# Patient Record
Sex: Female | Born: 1950 | Race: White | Hispanic: No | Smoking: Current some day smoker
Health system: Southern US, Community
[De-identification: ages and names within clinical notes are randomized; demographics above are authoritative.]

## PROBLEM LIST (undated history)

## (undated) DIAGNOSIS — M1990 Unspecified osteoarthritis, unspecified site: Secondary | ICD-10-CM

## (undated) DIAGNOSIS — I739 Peripheral vascular disease, unspecified: Secondary | ICD-10-CM

## (undated) DIAGNOSIS — J189 Pneumonia, unspecified organism: Secondary | ICD-10-CM

## (undated) DIAGNOSIS — Z87442 Personal history of urinary calculi: Secondary | ICD-10-CM

## (undated) DIAGNOSIS — G43909 Migraine, unspecified, not intractable, without status migrainosus: Secondary | ICD-10-CM

## (undated) DIAGNOSIS — I2510 Atherosclerotic heart disease of native coronary artery without angina pectoris: Secondary | ICD-10-CM

## (undated) DIAGNOSIS — M545 Low back pain, unspecified: Secondary | ICD-10-CM

## (undated) DIAGNOSIS — F329 Major depressive disorder, single episode, unspecified: Secondary | ICD-10-CM

## (undated) DIAGNOSIS — G4730 Sleep apnea, unspecified: Secondary | ICD-10-CM

## (undated) DIAGNOSIS — M47817 Spondylosis without myelopathy or radiculopathy, lumbosacral region: Secondary | ICD-10-CM

## (undated) DIAGNOSIS — F32A Depression, unspecified: Secondary | ICD-10-CM

## (undated) DIAGNOSIS — M47812 Spondylosis without myelopathy or radiculopathy, cervical region: Secondary | ICD-10-CM

## (undated) DIAGNOSIS — J45990 Exercise induced bronchospasm: Secondary | ICD-10-CM

## (undated) DIAGNOSIS — J449 Chronic obstructive pulmonary disease, unspecified: Secondary | ICD-10-CM

## (undated) DIAGNOSIS — Z8719 Personal history of other diseases of the digestive system: Secondary | ICD-10-CM

## (undated) DIAGNOSIS — I671 Cerebral aneurysm, nonruptured: Secondary | ICD-10-CM

## (undated) DIAGNOSIS — G459 Transient cerebral ischemic attack, unspecified: Secondary | ICD-10-CM

## (undated) DIAGNOSIS — E785 Hyperlipidemia, unspecified: Secondary | ICD-10-CM

## (undated) DIAGNOSIS — I679 Cerebrovascular disease, unspecified: Secondary | ICD-10-CM

## (undated) DIAGNOSIS — I10 Essential (primary) hypertension: Secondary | ICD-10-CM

## (undated) DIAGNOSIS — I77810 Thoracic aortic ectasia: Secondary | ICD-10-CM

## (undated) DIAGNOSIS — K219 Gastro-esophageal reflux disease without esophagitis: Secondary | ICD-10-CM

## (undated) DIAGNOSIS — Z87820 Personal history of traumatic brain injury: Secondary | ICD-10-CM

## (undated) DIAGNOSIS — D649 Anemia, unspecified: Secondary | ICD-10-CM

## (undated) DIAGNOSIS — R51 Headache: Principal | ICD-10-CM

## (undated) DIAGNOSIS — G8929 Other chronic pain: Secondary | ICD-10-CM

## (undated) HISTORY — DX: Exercise induced bronchospasm: J45.990

## (undated) HISTORY — DX: Cerebral aneurysm, nonruptured: I67.1

## (undated) HISTORY — DX: Thoracic aortic ectasia: I77.810

## (undated) HISTORY — DX: Migraine, unspecified, not intractable, without status migrainosus: G43.909

## (undated) HISTORY — DX: Headache: R51

## (undated) HISTORY — DX: Cerebrovascular disease, unspecified: I67.9

## (undated) HISTORY — DX: Atherosclerotic heart disease of native coronary artery without angina pectoris: I25.10

## (undated) HISTORY — DX: Essential (primary) hypertension: I10

## (undated) HISTORY — PX: FRACTURE SURGERY: SHX138

## (undated) HISTORY — PX: FOOT FRACTURE SURGERY: SHX645

## (undated) HISTORY — DX: Spondylosis without myelopathy or radiculopathy, lumbosacral region: M47.817

## (undated) HISTORY — PX: ACROMIO-CLAVICULAR JOINT REPAIR: SHX5183

## (undated) HISTORY — DX: Spondylosis without myelopathy or radiculopathy, cervical region: M47.812

## (undated) HISTORY — PX: JOINT REPLACEMENT: SHX530

## (undated) HISTORY — PX: CARDIAC CATHETERIZATION: SHX172

## (undated) HISTORY — PX: BACK SURGERY: SHX140

## (undated) HISTORY — DX: Hyperlipidemia, unspecified: E78.5

---

## 1999-07-04 ENCOUNTER — Other Ambulatory Visit: Admission: RE | Admit: 1999-07-04 | Discharge: 1999-07-04 | Payer: Self-pay | Admitting: Obstetrics and Gynecology

## 1999-08-12 ENCOUNTER — Other Ambulatory Visit: Admission: RE | Admit: 1999-08-12 | Discharge: 1999-08-12 | Payer: Self-pay | Admitting: Obstetrics and Gynecology

## 1999-08-12 ENCOUNTER — Encounter (INDEPENDENT_AMBULATORY_CARE_PROVIDER_SITE_OTHER): Payer: Self-pay | Admitting: Specialist

## 2000-03-15 ENCOUNTER — Ambulatory Visit (HOSPITAL_COMMUNITY): Admission: RE | Admit: 2000-03-15 | Discharge: 2000-03-15 | Payer: Self-pay

## 2000-11-18 ENCOUNTER — Emergency Department (HOSPITAL_COMMUNITY): Admission: EM | Admit: 2000-11-18 | Discharge: 2000-11-18 | Payer: Self-pay | Admitting: Emergency Medicine

## 2001-01-08 ENCOUNTER — Encounter: Payer: Self-pay | Admitting: Emergency Medicine

## 2001-01-08 ENCOUNTER — Inpatient Hospital Stay (HOSPITAL_COMMUNITY): Admission: EM | Admit: 2001-01-08 | Discharge: 2001-01-15 | Payer: Self-pay

## 2001-01-13 ENCOUNTER — Encounter: Payer: Self-pay | Admitting: General Surgery

## 2001-01-14 ENCOUNTER — Encounter: Payer: Self-pay | Admitting: Internal Medicine

## 2001-01-18 ENCOUNTER — Ambulatory Visit (HOSPITAL_COMMUNITY): Admission: RE | Admit: 2001-01-18 | Discharge: 2001-01-18 | Payer: Self-pay

## 2001-02-14 ENCOUNTER — Other Ambulatory Visit: Admission: RE | Admit: 2001-02-14 | Discharge: 2001-02-14 | Payer: Self-pay | Admitting: Obstetrics and Gynecology

## 2001-04-18 ENCOUNTER — Emergency Department (HOSPITAL_COMMUNITY): Admission: EM | Admit: 2001-04-18 | Discharge: 2001-04-18 | Payer: Self-pay | Admitting: Emergency Medicine

## 2001-04-18 ENCOUNTER — Encounter: Payer: Self-pay | Admitting: Emergency Medicine

## 2001-05-07 ENCOUNTER — Other Ambulatory Visit: Admission: RE | Admit: 2001-05-07 | Discharge: 2001-05-07 | Payer: Self-pay | Admitting: Obstetrics and Gynecology

## 2002-03-09 ENCOUNTER — Emergency Department (HOSPITAL_COMMUNITY): Admission: EM | Admit: 2002-03-09 | Discharge: 2002-03-09 | Payer: Self-pay | Admitting: Emergency Medicine

## 2002-03-09 ENCOUNTER — Encounter: Payer: Self-pay | Admitting: Emergency Medicine

## 2002-10-28 ENCOUNTER — Ambulatory Visit: Admission: RE | Admit: 2002-10-28 | Discharge: 2002-10-28 | Payer: Self-pay | Admitting: Internal Medicine

## 2002-10-28 ENCOUNTER — Encounter: Payer: Self-pay | Admitting: Internal Medicine

## 2003-01-22 ENCOUNTER — Other Ambulatory Visit: Admission: RE | Admit: 2003-01-22 | Discharge: 2003-01-22 | Payer: Self-pay | Admitting: Internal Medicine

## 2004-06-23 ENCOUNTER — Ambulatory Visit (HOSPITAL_COMMUNITY): Admission: RE | Admit: 2004-06-23 | Discharge: 2004-06-23 | Payer: Self-pay | Admitting: Internal Medicine

## 2004-09-02 ENCOUNTER — Emergency Department (HOSPITAL_COMMUNITY): Admission: EM | Admit: 2004-09-02 | Discharge: 2004-09-02 | Payer: Self-pay | Admitting: Emergency Medicine

## 2005-02-01 ENCOUNTER — Ambulatory Visit: Payer: Self-pay | Admitting: Physical Medicine & Rehabilitation

## 2005-02-01 ENCOUNTER — Encounter
Admission: RE | Admit: 2005-02-01 | Discharge: 2005-05-02 | Payer: Self-pay | Admitting: Physical Medicine & Rehabilitation

## 2005-03-14 ENCOUNTER — Encounter
Admission: RE | Admit: 2005-03-14 | Discharge: 2005-03-14 | Payer: Self-pay | Admitting: Physical Medicine & Rehabilitation

## 2005-05-02 ENCOUNTER — Ambulatory Visit: Payer: Self-pay | Admitting: Physical Medicine & Rehabilitation

## 2005-05-02 ENCOUNTER — Encounter
Admission: RE | Admit: 2005-05-02 | Discharge: 2005-07-31 | Payer: Self-pay | Admitting: Physical Medicine & Rehabilitation

## 2005-08-03 ENCOUNTER — Encounter
Admission: RE | Admit: 2005-08-03 | Discharge: 2005-11-01 | Payer: Self-pay | Admitting: Physical Medicine & Rehabilitation

## 2005-08-03 ENCOUNTER — Ambulatory Visit: Payer: Self-pay | Admitting: Physical Medicine & Rehabilitation

## 2005-11-02 ENCOUNTER — Encounter
Admission: RE | Admit: 2005-11-02 | Discharge: 2006-01-31 | Payer: Self-pay | Admitting: Physical Medicine & Rehabilitation

## 2005-11-02 ENCOUNTER — Ambulatory Visit: Payer: Self-pay | Admitting: Physical Medicine & Rehabilitation

## 2006-02-06 ENCOUNTER — Other Ambulatory Visit: Admission: RE | Admit: 2006-02-06 | Discharge: 2006-02-06 | Payer: Self-pay | Admitting: Internal Medicine

## 2007-09-25 ENCOUNTER — Ambulatory Visit (HOSPITAL_COMMUNITY): Admission: RE | Admit: 2007-09-25 | Discharge: 2007-09-25 | Payer: Self-pay | Admitting: Internal Medicine

## 2007-10-05 ENCOUNTER — Emergency Department (HOSPITAL_COMMUNITY): Admission: EM | Admit: 2007-10-05 | Discharge: 2007-10-05 | Payer: Self-pay | Admitting: Emergency Medicine

## 2008-11-05 ENCOUNTER — Ambulatory Visit (HOSPITAL_COMMUNITY): Admission: RE | Admit: 2008-11-05 | Discharge: 2008-11-05 | Payer: Self-pay | Admitting: Internal Medicine

## 2008-12-25 DIAGNOSIS — I639 Cerebral infarction, unspecified: Secondary | ICD-10-CM

## 2008-12-25 HISTORY — DX: Cerebral infarction, unspecified: I63.9

## 2010-01-10 ENCOUNTER — Encounter: Admission: RE | Admit: 2010-01-10 | Discharge: 2010-01-10 | Payer: Self-pay | Admitting: Gastroenterology

## 2010-02-02 ENCOUNTER — Ambulatory Visit (HOSPITAL_COMMUNITY): Admission: RE | Admit: 2010-02-02 | Discharge: 2010-02-02 | Payer: Self-pay | Admitting: Internal Medicine

## 2010-11-23 ENCOUNTER — Ambulatory Visit (HOSPITAL_COMMUNITY)
Admission: RE | Admit: 2010-11-23 | Discharge: 2010-11-23 | Payer: Self-pay | Source: Home / Self Care | Admitting: Internal Medicine

## 2011-05-12 NOTE — Assessment & Plan Note (Signed)
MEDICAL RECORD NUMBER:  [id number]   Brenda Carney is here regarding her low back and neck pain. She had been doing  fairly well until she was squashed by a horse this past week. She is having  some new right rib pain at the chest level. Low back has been fairly stable.  She does have some tightness some days and tries to do some stretching but  not particularly consistent. She has started on a new diet which is higher  in protein and this seems to be helping her energy and mood. She takes an  oxycodone 5 mg q.6 h p.r.n. She uses her TENS unit effectively and also is  on Cymbalta 30 mg a day. The patient rates her pain at a 5/10. She calls it  intermittent, stabbing and sharp. It interferes with her general activity,  relations with others and enjoyment of life on a moderate level. Sleep is  fair.   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   REVIEW OF SYMPTOMS:  The patient reports some weakness, dizziness and  occasional nausea. She denies any GU, cardiorespiratory or constitutional  symptoms today.   PHYSICAL EXAMINATION:  VITAL SIGNS:  Blood pressure is 126/84, pulse is 108,  respiratory rate 18. She is sating 97% on room air.  GENERAL:  The patient is pleasant in no acute distress.  HEART:  Regular rate and rhythm.  LUNGS:  Clear.  NEUROLOGIC:  She is alert and oriented x3. Affect is bright and appropriate.  Gait is stable. Pelvis is symmetrical. Coordination is good. She has some  pain with palpation over the lower lumbar paraspinal's but this is minimal  today. There is pain over the right ribs at the fourth and fifth rib level  in the axillary line. No popping or crepitus was noted. Motor function was  5/5. Sensory exam is stable. Low back range of motion was relatively intact  and minimally provocative from a pain standpoint.   ASSESSMENT:  1.  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 The patient has a profound disk bulge at L4-5 and      tight facets at L5-S1. She has been fairly stable with this.  2.  Greater trochanter bursitis.  3.  Leg length discrepancy on the left.  4.  Lumbar thoracic and thoracic cervical scoliosis.  5.  Bilateral rotator cuff syndrome.   PLAN:  1.  I filled the oxycodone today, #60, 5 mg q.6 h p.r.n.  2.  I gave the patient a prescription for Lidoderm to use on the ribcage and      low back during the day from 8 a.m. to 10 p.m. approximately.  3.  Continue TENS unit.  4.  Discussed core muscle and low back strengthening exercises. I gave her a      low back handout today.  5.  Continue with shoe insert on the left.  6.  Will see the patient back in three months time.  7.  Generally she is stable and understands that she will have to live with      a certain degree of pain as long as she is in this line of work.       ZTS/MedQ  D:  08/04/2005 14:02:19  T:  08/04/2005 17:05:14  Job #:  [id number]   cc:   Lovenia Kim, D.O.  [address]  [place]  [place]  [address]  Fax: [phone]
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
## 2011-05-12 NOTE — Assessment & Plan Note (Signed)
MEDICAL RECORD NUMBER:  [id number].   [name] is here regarding her low back and neck pain. She has been doing  quite well of late. She tried a new acupressure massage table through one of  the practices here in town. It also involved infrared light treatment. The  patient found that this has been very helpful. She is really using no  oxycodone currently for pain. She continues to wear her left shoe lift. She  keeps up with her TENS unit as well as stretching. 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 The patient  rates her pain as a 4 to 5/10. Describes it as sharp, stabbing. It seems to  be relegated mostly to the neck and low back areas. Pain increases with  walking and general activities. It improves somewhat with rest. She is  having problems sleeping still. She has had an endocrine workup and  apparently she is having problems with her cortisol levels and diurnal  variation.   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   REVIEW OF SYSTEMS:  Negative for any neurological, psychiatric, GU, GI, or  cardiorespiratory complaints today.   PHYSICAL EXAMINATION:  VITAL SIGNS:  Blood pressure is 136/92, pulse 97,  respiratory rate 16. She is saturating 97% on room air.  GENERAL:  The patient is pleasant in no acute distress. She appears to have  lost some weight.  HEART:  Regular rate and rhythm.  LUNGS:  Clear.  ABDOMEN:  Soft, nontender.  NEUROLOGICAL:  She is alert and oriented x3. Affect is bright and  appropriate. Gait is stable. Sensation is normal. Reflexes are 2+.  Coordination is intact. No significant changes were noted in her physical  exam today.   ASSESSMENT:  1.  Chronic cervical and lumbar spondylosis related to spondylolisthesis and      prior polytrauma.  2.  History of greater trochanter bursitis.  3.  Leg length discrepancy on the left.  4.  Lumbar thoracic and  thoracic cervical scoliosis.  5.  Bilateral rotator cuff syndrome.   PLAN:  1.  I am in complete agreement with her acupressure treatment as long as she      willing to return for visits and treatments. Apparently, she is      receiving these free as part of a promotion. The infrared lights should      be of some value to her muscle and pain syndrome as well. I will      continue with the TENS unit and stretching as well. She needs to be      realistic about her work demands and start delegating some of her work      out as appropriate.  2.  Continue with her left shoe insert.  3.  I refilled her Valium although I would like to see her titrate this off.  4.  Agree with cortisol replacement therapy. This certainly will affect her      sleep/wake cycle.  5.  I will see her back on an as needed basis for now. No oxycodone was      given today.      Brenda Carney, M.D.  Electronically Signed     ZTS/MedQ  D:  11/03/2005 12:08:47  T:  11/03/2005 15:48:55  Job #:  [id number]   cc:   Brenda Carney, D.O.  Fax: [phone]

## 2011-05-12 NOTE — Discharge Summary (Signed)
[place]  Patient:    Brenda Carney, Brenda Carney                      MRN: [id number] Adm. Date:  [id number] Attending:  Trauma, Md Dictator:   Eugenia Pancoast, P.A. CC:         Velora Heckler, M.D., admitting physician                           Discharge Summary  DATE OF BIRTH:  1951-06-03.  FINAL DIAGNOSES: 1. Fall from horse. 2. Blunt trauma. 3. Fracture right ribs posterior eight, nine and 10. 4. Small intrahepatic hematoma. 5. Posterior right flank contusion. 6. Hypokalemia, resolved. 7. Right pleural effusion.  PROCEDURES:  Ultrasound guided needle thoracentesis per radiology.  HISTORY OF PRESENT ILLNESS:  This is a 60 year old female who was thrown by a horse and then the horse stepped on her back on the right side.  Because of this, the patient was brought to the [facility] Emergency Room. At the time of arrival, she was complaining of pain in the lower back area.  She was also complaining of pain in the right posterior flank and chest area.  She was alert and oriented.  She had no head injury.  She had some mild facial lacerations on her forehead and some over the dorsum of her hand.  Otherwise she was satisfactory.  Because of the nature of the injury, she underwent CT scan of the abdomen and pelvis which revealed a small intrahepatic hematoma. Chest x-ray revealed a fracture of right ribs eight, nine and 10 posteriorly. Because of this, she was admitted.  HOSPITAL COURSE:  During admission, initially she did satisfactorily.  She did have a lot of pain and pain control was an issue initially but this subsequently was brought under control.  She did develop, over her stay, a small right pleural effusion.  Subsequently, on January 14, 2001, she underwent ultrasound guided needle thoracentesis.  Fluid was removed at this time.  The patient did breath easier.  Post procedure the patient did have complaint of some shortness of breath.   Repeat chest x-rays were all negative. She subsequently had chest x-ray PA upright done on January 15, 2001, in the morning and this was without any signs of any pneumothorax.  The infusion was resolved at this time.  On January 15, 2001, the patient was doing quite well. She was having no complaints of shortness of breath.  She was tolerating a diet satisfactorily.  Her last O2 saturation was 90% on room air. This should be expected due to the patients decreased inspirations because of the pain. She was doing well at this time.  She was alert and oriented.  Her pain was controlled and her chest was clear to auscultation.  She was subsequently prepared for discharge.  She was given Percocet 5/325 one or two p.o. q.4-6h. p.r.n. for pain.  She was subsequently discharged to home on January 15, 2001. She would follow up with trauma clinic on Friday, January 18, 2001, for a repeat chest x-ray. The patient is subsequently discharged to home in satisfactory and stable condition. DD:  01/15/01 TD:  01/15/01 Job: [id number]

## 2011-05-12 NOTE — Assessment & Plan Note (Signed)
MEDICAL RECORD NUMBER:  Is #[id number].   Brenda Carney is back regarding her low back and neck pain.  She has been doing  fairly well since we last talked.  She has been at a 4 to 6 out of 10 on a  pain scale.  The pain is sharp, intermittent, stabbing.  She has had a hard  time unlocking her back.  She does some manual medicine/traction type  techniques that help her back loosen up.  They seem not to have been working  lately.  She uses her TENS unit an hour a day and finds this is helpful.  She had some pain along the right anterior hip that seems to have improved  on its own.  The pain is worse throughout the day usually with activity and  at bed.  It is worse with walking, bending, standing.  Improves with rest,  pacing, medication, TENS unit.  She found that the generic oxycodone caused  her nausea whereas the brand name OxyContin immediate release did seem to  help her pain with less side effects.  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  She climbs stairs, drives, and works 80 hours a week.   REVIEW OF SYSTEMS:  Positive for nausea, dizziness, occasional shortness of  breath.   SOCIAL HISTORY:  Unchanged.   PHYSICAL EXAMINATION:  VITAL SIGNS:  Blood pressure is 119/71, pulse is 100,  respiratory rate is 16.  She is sating 96% on room air.  GENERAL:  The patient is pleasant, no acute distress.  HEART:  Regular rate and rhythm.  CHEST:  Clear.  ABDOMEN:  Soft, nontender.  NEUROLOGIC:  The patient is alert and oriented x 3.  Affect is bright  appropriate.  Gait is fairly normal.  Coordination is fair.  Reflexes are  2+.  Sensation is intact.  MUSCULOSKELETAL:  Reveals 5/5 strength in all four extremities.  She has  pain in the lower lumbar facets and at the PSIS areas bilaterally.  Facet  maneuvers were positive.  Flexion seemed to relieve some pain.  Luisa Hart test  was equivocal to negative.  Cervical spine range of motion was unchanged.  She had less signs of  impingement today as opposed to the last visit.   ASSESSMENT:  1.  Chronic cervical lumbar spine pain due to polytrauma and      spondylolisthesis in the lumbar spine with profound disk bulge at L4-L5.      She has some tight facets at L5-S1.  It appears particularly on the left      side.  2.  Greater trochanter bursitis.  3.  Leg length discrepancy on the left.  4.  Lumbar/thoracic and thoracic/cervical scoliosis.  5.  Bilateral rotator cuff syndrome.   PLAN:  1.  I have refilled oxycodone with brand name OxyContin IR 5 mg q.6h. p.r.n.      #60.  2.  Continue TENS unit.  3.  Discussed flexion exercises for the back.  4.  Continue with shoe insert.  She may consider a custom insert at some      point but seems satisfied with the current off the shelf insert we      provided.  5.  I will see her back in about three months' time in followup.  Generally      she is stable and is cognizant of her stretching program and her general      limits with activity.  ZTS/MedQ  D:  05/02/2005 15:04:45  T:  05/02/2005 15:26:11  Job #:  [id number]   cc:   Lovenia Kim, D.O.  [address]  [place]  [place] [address]  Fax: [phone]

## 2011-05-12 NOTE — Assessment & Plan Note (Signed)
MEDICAL RECORD NUMBER:  [id number]   Brenda Carney is here in followup of her multiple pain complaints involving her  hip, low back and feet. We injected her bilateral greater trochanters at the  last visit and the patient had excellent results with these. We also tried a  1/4 inch shoe lift on the left side and the patient did well with this  nicely also. She uses oxycodone rarely for her breakthrough pain. She has  not spoken further with Dr. Danielle Dess regarding surgical opinion for her back.  She rates her pain at average of 4/10. She has gotten back to a lot of her  old activities involving her horses and pets.  The low back remains a  problem to a certain extent as well as the neck region. She exercises but is  not on a regular stretching or aerobic program.  The pain is worse with  walking, bending and lifting and improves with rest and her medications.  She has had some problems intermittently in the left shoulder blade.   SOCIAL HISTORY:  The patient lives with her husband and is married. Other  pertinent issues are mentioned above.   REVIEW OF SYMPTOMS:  The patient reports a heart murmur, occasional nausea  with vomiting but this is rare. She reports reflux and dysphagia at times  due to hiatal hernia. She has gained weight over the past several months.  She does report sweating and headaches.   PHYSICAL EXAMINATION:  VITAL SIGNS:  Blood pressure is 133/85, pulse is 110,  respiratory rate 16, she is sating 96% on room air.  NEUROLOGIC:  The patient walks with a fairly normal gait, affect is bright  and appropriate, appearance is well kept.  She was not wearing her shoe lift  on the left today and had some imbalance in the pelvis once again of  approximately 1/4 inch.  The PSIS areas were moderately tender to palpation.  Facets were mildly uncomfortable. She is able to bend forward without any  pain and rotate to either side with minimal discomfort, clear trochanters  were minimally  tender today. She did have some pain along the left side of  the rhomboids. There was a mild dextroscoliosis of the thoraco into cervical  spine. She had some difficulty with left neck bending and rotation.  Shoulders revealed impingement signs but to lesser extent than her last  visit. Phalen's test was positive bilaterally. Neurologically cranial nerve  exam was intact. Reflexes were 2+, sensory exam was 2/2.  Strength was 5/5  throughout.   ASSESSMENT:  1.  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  2.  Greater trochanter bursitis improved after injections.  3.  Leg length discrepancy on the left side.  4.  Lumbar thoracic and thoracic cervical scoliosis.  5.  Bilateral rotator cuff syndrome.   PLAN:  1.  I encouraged home exercise program and stretching routine.  2.  Will set the patient up with a TENS unit to some areas of muscle      tightness in the thoraco and low cervical spine.  I will have her seen      by physical therapy for evaluation in set up with a TENS unit. She may      also receive some exercises to start with from therapy to work on her  low back and cervical maintenance.  3.  Ice and stretching to the hip areas.  4.  Consider custom insert to the left shoe.  5.  In the meantime, she may buy a similar size insert to place in her shoes      to accommodate for her leg length discrepancy.  6.  Use oxycodone sparingly for breakthrough pain.  7.  Will see the patient back in about two months time.      ZTS/MedQ  D:  02/27/2005 21:39:06  T:  02/28/2005 08:04:49  Job #:  [id number]   cc:   Lovenia Kim, D.O.  [address], [address]  [place]  [place] [address]  Fax: [phone]

## 2011-07-07 ENCOUNTER — Other Ambulatory Visit (HOSPITAL_COMMUNITY)
Admission: RE | Admit: 2011-07-07 | Discharge: 2011-07-07 | Disposition: A | Discharge status: Home / Self Care | Payer: BC Managed Care – PPO | Source: Ambulatory Visit | Attending: Internal Medicine | Admitting: Internal Medicine

## 2011-07-07 ENCOUNTER — Ambulatory Visit (HOSPITAL_COMMUNITY)
Admission: RE | Admit: 2011-07-07 | Discharge: 2011-07-07 | Disposition: A | Discharge status: Home / Self Care | Payer: BC Managed Care – PPO | Source: Ambulatory Visit | Attending: Internal Medicine | Admitting: Internal Medicine

## 2011-07-07 ENCOUNTER — Other Ambulatory Visit: Payer: Self-pay | Admitting: Internal Medicine

## 2011-07-07 ENCOUNTER — Other Ambulatory Visit (HOSPITAL_COMMUNITY): Payer: Self-pay | Admitting: Internal Medicine

## 2011-07-07 DIAGNOSIS — R059 Cough, unspecified: Secondary | ICD-10-CM

## 2011-07-07 DIAGNOSIS — R05 Cough: Secondary | ICD-10-CM

## 2011-07-07 DIAGNOSIS — I10 Essential (primary) hypertension: Secondary | ICD-10-CM | POA: Insufficient documentation

## 2011-07-07 DIAGNOSIS — Z87891 Personal history of nicotine dependence: Secondary | ICD-10-CM

## 2011-07-07 DIAGNOSIS — J984 Other disorders of lung: Secondary | ICD-10-CM | POA: Insufficient documentation

## 2011-07-07 DIAGNOSIS — Z01419 Encounter for gynecological examination (general) (routine) without abnormal findings: Secondary | ICD-10-CM | POA: Insufficient documentation

## 2011-07-20 ENCOUNTER — Ambulatory Visit
Admission: RE | Admit: 2011-07-20 | Discharge: 2011-07-20 | Disposition: A | Discharge status: Home / Self Care | Payer: BC Managed Care – PPO | Source: Ambulatory Visit | Attending: Internal Medicine | Admitting: Internal Medicine

## 2011-07-20 ENCOUNTER — Other Ambulatory Visit: Payer: Self-pay | Admitting: Internal Medicine

## 2011-07-20 DIAGNOSIS — IMO0002 Reserved for concepts with insufficient information to code with codable children: Secondary | ICD-10-CM

## 2011-07-20 DIAGNOSIS — R52 Pain, unspecified: Secondary | ICD-10-CM

## 2011-07-20 DIAGNOSIS — I639 Cerebral infarction, unspecified: Secondary | ICD-10-CM

## 2011-07-20 DIAGNOSIS — R29810 Facial weakness: Secondary | ICD-10-CM

## 2011-07-21 ENCOUNTER — Other Ambulatory Visit: Payer: Self-pay | Admitting: Internal Medicine

## 2011-07-21 DIAGNOSIS — I6529 Occlusion and stenosis of unspecified carotid artery: Secondary | ICD-10-CM

## 2011-07-25 ENCOUNTER — Ambulatory Visit
Admission: RE | Admit: 2011-07-25 | Discharge: 2011-07-25 | Disposition: A | Discharge status: Home / Self Care | Payer: BC Managed Care – PPO | Source: Ambulatory Visit | Attending: Internal Medicine | Admitting: Internal Medicine

## 2011-07-25 DIAGNOSIS — I6529 Occlusion and stenosis of unspecified carotid artery: Secondary | ICD-10-CM

## 2011-09-25 DIAGNOSIS — G459 Transient cerebral ischemic attack, unspecified: Secondary | ICD-10-CM

## 2011-09-25 HISTORY — DX: Transient cerebral ischemic attack, unspecified: G45.9

## 2011-10-01 ENCOUNTER — Emergency Department (HOSPITAL_COMMUNITY)
Admission: EM | Admit: 2011-10-01 | Discharge: 2011-10-01 | Disposition: A | Discharge status: Other Acute Inpatient Hospital | Payer: BC Managed Care – PPO | Source: Home / Self Care | Attending: Emergency Medicine | Admitting: Emergency Medicine

## 2011-10-01 ENCOUNTER — Observation Stay (HOSPITAL_COMMUNITY)
Admission: AD | Admit: 2011-10-01 | Discharge: 2011-10-02 | DRG: 014 | Disposition: A | Discharge status: Home / Self Care | Payer: BC Managed Care – PPO | Source: Other Acute Inpatient Hospital | Attending: Internal Medicine | Admitting: Internal Medicine

## 2011-10-01 ENCOUNTER — Emergency Department (HOSPITAL_COMMUNITY): Payer: BC Managed Care – PPO

## 2011-10-01 DIAGNOSIS — Z8673 Personal history of transient ischemic attack (TIA), and cerebral infarction without residual deficits: Secondary | ICD-10-CM | POA: Insufficient documentation

## 2011-10-01 DIAGNOSIS — R0602 Shortness of breath: Secondary | ICD-10-CM | POA: Insufficient documentation

## 2011-10-01 DIAGNOSIS — R079 Chest pain, unspecified: Secondary | ICD-10-CM | POA: Insufficient documentation

## 2011-10-01 DIAGNOSIS — E876 Hypokalemia: Secondary | ICD-10-CM | POA: Insufficient documentation

## 2011-10-01 DIAGNOSIS — IMO0002 Reserved for concepts with insufficient information to code with codable children: Secondary | ICD-10-CM | POA: Insufficient documentation

## 2011-10-01 DIAGNOSIS — R29810 Facial weakness: Principal | ICD-10-CM | POA: Insufficient documentation

## 2011-10-01 DIAGNOSIS — F17200 Nicotine dependence, unspecified, uncomplicated: Secondary | ICD-10-CM | POA: Insufficient documentation

## 2011-10-01 DIAGNOSIS — I10 Essential (primary) hypertension: Secondary | ICD-10-CM | POA: Insufficient documentation

## 2011-10-01 DIAGNOSIS — R51 Headache: Secondary | ICD-10-CM | POA: Insufficient documentation

## 2011-10-01 DIAGNOSIS — E785 Hyperlipidemia, unspecified: Secondary | ICD-10-CM | POA: Insufficient documentation

## 2011-10-01 DIAGNOSIS — E039 Hypothyroidism, unspecified: Secondary | ICD-10-CM | POA: Insufficient documentation

## 2011-10-01 DIAGNOSIS — R1310 Dysphagia, unspecified: Secondary | ICD-10-CM | POA: Insufficient documentation

## 2011-10-01 LAB — POCT I-STAT, CHEM 8
BUN: 19 mg/dL (ref 6–23)
Calcium, Ion: 1.17 mmol/L (ref 1.12–1.32)
Chloride: 106 mEq/L (ref 96–112)
Creatinine, Ser: 0.8 mg/dL (ref 0.50–1.10)
Glucose, Bld: 106 mg/dL — ABNORMAL HIGH (ref 70–99)
HCT: 38 % (ref 36.0–46.0)
Hemoglobin: 12.9 g/dL (ref 12.0–15.0)
Potassium: 3.1 mEq/L — ABNORMAL LOW (ref 3.5–5.1)
Sodium: 138 mEq/L (ref 135–145)
TCO2: 22 mmol/L (ref 0–100)

## 2011-10-01 LAB — DIFFERENTIAL
Basophils Absolute: 0 10*3/uL (ref 0.0–0.1)
Basophils Relative: 0 % (ref 0–1)
Eosinophils Absolute: 0.3 10*3/uL (ref 0.0–0.7)
Eosinophils Relative: 3 % (ref 0–5)
Lymphocytes Relative: 31 % (ref 12–46)
Lymphs Abs: 2.8 10*3/uL (ref 0.7–4.0)
Monocytes Absolute: 0.6 10*3/uL (ref 0.1–1.0)
Monocytes Relative: 7 % (ref 3–12)
Neutro Abs: 5.2 10*3/uL (ref 1.7–7.7)
Neutrophils Relative %: 58 % (ref 43–77)

## 2011-10-01 LAB — COMPREHENSIVE METABOLIC PANEL
ALT: 18 U/L (ref 0–35)
AST: 35 U/L (ref 0–37)
Albumin: 3.4 g/dL — ABNORMAL LOW (ref 3.5–5.2)
Alkaline Phosphatase: 96 U/L (ref 39–117)
BUN: 18 mg/dL (ref 6–23)
CO2: 22 mEq/L (ref 19–32)
Calcium: 9.5 mg/dL (ref 8.4–10.5)
Chloride: 99 mEq/L (ref 96–112)
Creatinine, Ser: 0.69 mg/dL (ref 0.50–1.10)
GFR calc Af Amer: >90 mL/min (ref >90)
GFR calc non Af Amer: >90 mL/min (ref >90)
Glucose, Bld: 104 mg/dL — ABNORMAL HIGH (ref 70–99)
Potassium: 3.1 mEq/L — ABNORMAL LOW (ref 3.5–5.1)
Sodium: 136 mEq/L (ref 135–145)
Total Bilirubin: 0.2 mg/dL — ABNORMAL LOW (ref 0.3–1.2)
Total Protein: 6.8 g/dL (ref 6.0–8.3)

## 2011-10-01 LAB — CK TOTAL AND CKMB (NOT AT ARMC)
CK, MB: 2.5 ng/mL (ref 0.3–4.0)
Relative Index: INVALID (ref 0.0–2.5)
Total CK: 76 U/L (ref 7–177)

## 2011-10-01 LAB — PROTIME-INR
INR: 0.96 (ref 0.00–1.49)
Prothrombin Time: 13 seconds (ref 11.6–15.2)

## 2011-10-01 LAB — CBC
HCT: 38.2 % (ref 36.0–46.0)
Hemoglobin: 13.7 g/dL (ref 12.0–15.0)
MCH: 35.4 pg — ABNORMAL HIGH (ref 26.0–34.0)
MCHC: 35.9 g/dL (ref 30.0–36.0)
MCV: 98.7 fL (ref 78.0–100.0)
Platelets: 343 10*3/uL (ref 150–400)
RBC: 3.87 MIL/uL (ref 3.87–5.11)
RDW: 12.9 % (ref 11.5–15.5)
WBC: 8.9 10*3/uL (ref 4.0–10.5)

## 2011-10-01 LAB — APTT: aPTT: 27 seconds (ref 24–37)

## 2011-10-01 LAB — TROPONIN I: Troponin I: <0.3 ng/mL (ref <0.30)

## 2011-10-02 ENCOUNTER — Inpatient Hospital Stay (HOSPITAL_COMMUNITY): Payer: BC Managed Care – PPO

## 2011-10-02 LAB — LIPID PANEL
Cholesterol: 209 mg/dL — ABNORMAL HIGH (ref 0–200)
HDL: 43 mg/dL (ref >39)
LDL Cholesterol: 105 mg/dL — ABNORMAL HIGH (ref 0–99)
Total CHOL/HDL Ratio: 4.9 RATIO
Triglycerides: 305 mg/dL — ABNORMAL HIGH (ref <150)
VLDL: 61 mg/dL — ABNORMAL HIGH (ref 0–40)

## 2011-10-02 LAB — CBC
HCT: 34.7 % — ABNORMAL LOW (ref 36.0–46.0)
Hemoglobin: 12.3 g/dL (ref 12.0–15.0)
MCH: 34.4 pg — ABNORMAL HIGH (ref 26.0–34.0)
MCHC: 35.4 g/dL (ref 30.0–36.0)
MCV: 96.9 fL (ref 78.0–100.0)
Platelets: 300 10*3/uL (ref 150–400)
RBC: 3.58 MIL/uL — ABNORMAL LOW (ref 3.87–5.11)
RDW: 12.8 % (ref 11.5–15.5)
WBC: 7.4 10*3/uL (ref 4.0–10.5)

## 2011-10-02 LAB — URINALYSIS, ROUTINE W REFLEX MICROSCOPIC
Bilirubin Urine: NEGATIVE
Glucose, UA: NEGATIVE mg/dL
Hgb urine dipstick: NEGATIVE
Ketones, ur: NEGATIVE mg/dL
Nitrite: NEGATIVE
Protein, ur: NEGATIVE mg/dL
Specific Gravity, Urine: 1.019 (ref 1.005–1.030)
Urobilinogen, UA: 0.2 mg/dL (ref 0.0–1.0)
pH: 6 (ref 5.0–8.0)

## 2011-10-02 LAB — BASIC METABOLIC PANEL
BUN: 14 mg/dL (ref 6–23)
CO2: 25 mEq/L (ref 19–32)
Calcium: 8.8 mg/dL (ref 8.4–10.5)
Chloride: 106 mEq/L (ref 96–112)
Creatinine, Ser: 0.49 mg/dL — ABNORMAL LOW (ref 0.50–1.10)
GFR calc Af Amer: >90 mL/min (ref >90)
GFR calc non Af Amer: >90 mL/min (ref >90)
Glucose, Bld: 99 mg/dL (ref 70–99)
Potassium: 3.6 mEq/L (ref 3.5–5.1)
Sodium: 140 mEq/L (ref 135–145)

## 2011-10-02 LAB — URINE MICROSCOPIC-ADD ON

## 2011-10-02 LAB — HEMOGLOBIN A1C
Hgb A1c MFr Bld: 5.3 % (ref <5.7)
Mean Plasma Glucose: 105 mg/dL (ref <117)

## 2011-10-02 LAB — GLUCOSE, CAPILLARY: Glucose-Capillary: 110 mg/dL — ABNORMAL HIGH (ref 70–99)

## 2011-10-02 NOTE — Consult Note (Signed)
NAMEMARILI, Carney NO.:  [id number]  MEDICAL RECORD NO.:  [id number]  LOCATION:  3030                         FACILITY:  [facility]  PHYSICIAN:  Kipp Laurence, MD DATE OF BIRTH:  1951/09/14  DATE OF CONSULTATION:  10/01/2011 DATE OF DISCHARGE:                                CONSULTATION   CHIEF COMPLAINT:  Left face feels funny.  HISTORY OF PRESENT ILLNESS:  [name] is a 60 year old white woman with history of hypertension, hyperlipidemia, and a recent CVA in July 2012 during which she experienced left facial numbness and fullness who presents to the Emergency Department of [facility] with complaints of a recurrent left facial fullness beginning at 6:30 this evening.  The patient reports that she was at her baseline all day today. At around 6 o'clock she got into the shower and at around 6:30 p.m. when she got out of the shower, she noticed that left side of her face felt full and swollen.  In the mirror, she was also concerned that there was some drooping of the left side of her face.  She reports she had mild numbness over the same distribution and numbness of her tongue.  She denies any other symptoms at that time.  The patient presented with her husband to the [facility] ED where a code stroke was called. CT scan of the head was obtained and appeared unchanged from her recent scan in July of this year.  During evaluation by the ED physician, she was noted to have some improvement of her left facial droop.  The patient also reports that she has experienced recent symptoms of possible upper respiratory infection and some dysuria concerning for UTI.  She reports that she has recurrent problems with pneumonias and with urinary tract infections; otherwise she states she has recently been in good health and did not notice any residual symptoms from her stroke in July.  PAST MEDICAL HISTORY: 1. 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 2. Hypertension. 3. Hyperlipidemia. 4. Chronic back pain. 5. Recurrent pneumonia. 6. Tobacco abuse. 7. Left AC joint surgery in the 1990s. 8. Right toe surgery about 5 years ago. 9. History of trauma from being kicked by a horse in the     thoracoabdominal region 7 to 8 years ago.  No head trauma.  MEDICATIONS:  Please see med reconciliation list, the patient is on: 1. Plavix 75 mg daily. 2. Aspirin 325 mg daily at home and has been compliant with these.  ALLERGIES:  PENICILLIN.  FAMILY HISTORY:  Mom has history of MI, hyperlipidemia, and hypertension.  Dad  has history of coronary artery disease, peripheral arterial disease, and died of a stroke at the age of 92.  SOCIAL HISTORY:  The patient works as a Physiological scientist.  She does report tobacco use and smokes approximately 3 to 4 cigarettes per day.  She reports alcohol use and drinks about one mixed drink per day.  She denies illicit drug use.  REVIEW OF SYSTEMS:  A complete 10 system review  of systems was obtained and is negative except as noted in the HPI.  PHYSICAL EXAMINATION:  VITAL SIGNS:  Blood pressure 140/82, heart rate 96, respiratory rate 16, and O2 saturation 85%. PULMONARY:  Upper airway noise; bibasilar rhonchi. CARDIOVASCULAR:  Regular rate and rhythm.  No murmurs, gallops, or rubs. No carotid bruits auscultated. NEURO: Mental status:  The patient is alert and oriented x3.  Speech is clear. Language is fluent.  Memory is intact.  She is able to follow multi-step commands. Cranial nerves: II-  Visual fields are intact to confrontation. Funduscopic exam is unremarkable.  III, IV, VI-  Extraocular movements are intact.  Pupils are equally round and react to light accommodation. No nystagmus.  V-  Facial sensation is intact bilaterally.  No weakness in masticatory muscles.  VII-  Left lower facial weakness is slight.   No upper facial weakness noted.  VIII-  Auditory acuity is grossly intact bilaterally.  IX, X-  Uvula is midline.  Palate elevates symmetrically. XI-  5/5 strength in bilateral sternocleidomastoids and trapezius.  XII- Tongue is midline and does not deviate. Motor:  5/5 strength throughout. Sensory:  Decreased to light touch in the left arm otherwise intact throughout.  The patient is not sure if this is a baseline finding or not. Coordination:  Intact finger-nose-finger, heel-to-shin, and rapid alternating movements. Reflexes:  2+ deep tendon reflexes throughout, absent Hoffmann's and Babinski's bilaterally. Gait:  Routine gait is intact.  NIH stroke scale score of 2 (1 for slight left lower facial droop, and 1 for left arm sensory change).  TESTING RESULTS:  BMP, CBC, and coags are unremarkable with the exception of potassium of 3.1.  EKG reveals sinus tachycardia.  Chest x- ray is normal.  IMAGING: 1. CT of the head on October 01, 2011: left deep white matter     periventricular hypodensity appears unchanged from CT scan from     July 20, 2011.  No acute abnormalities noted. 2. Carotid duplex from July 25, 2011: bilateral carotid bifurcationplaques with less than 50% stenosis; normal vertebral arteries.  ASSESSMENT:  A 60 year old white woman with a recent cerebrovascular accident in July 2012 and with hypertension and hyperlipidemia representing with recurrent symptoms of left facial weakness and numbness with some improvement during her visit to the ED and with recent symptoms of possible respiratory infection.  I suspect that her symptoms are secondary to transient ischemic attack versus a small cerebrovascular accident versus re-demonstration of old stroke symptoms secondary to respiratory or urinary tract infection.  T-PA was not given secondary to her low NIH stroke scale score and mild improvement of symptoms.  Of note, the patient's swallowing was evaluated at bedside  by nursing, and she did fail this evaluation secondary to a report of unusual sensation at the back of her throat.  PLAN: 1. Recommend inpatient workup with MRI of the brain with and without     contrast and MRA of the head and neck to further evaluate. 2. Consider repeat TTE versus obtaining outpatient records.  The     patient states she had an outpatient TTE done in July. 3. Recommend risk stratification labs with hemoglobin A1c, TSH, and     lipids. 4. Would continue her home aspirin and Plavix dosages.  She has had     both of these today. 5. Recommend workup of her reported infectious symptoms as per     Medicine. 6. Would consider repeating bedside swallow eval and if it is still  abnormal recommend Speech consult tomorrow for FEES.  Thank you very much for this consultation.          ______________________________ Kipp Laurence, MD     ES/MEDQ  D:  10/01/2011  T:  10/02/2011  Job:  [id number]  Electronically Signed by Kipp Laurence MD on 10/02/2011 05:01:41 AM

## 2011-10-02 NOTE — H&P (Signed)
Brenda Carney, Brenda Carney NO.:  [id number]  MEDICAL RECORD NO.:  [id number]  LOCATION:  3030                         FACILITY:  [facility]  PHYSICIAN:  Della Goo, M.D. DATE OF BIRTH:  [name] 14, 1951  DATE OF ADMISSION:  10/01/2011 DATE OF DISCHARGE:                             HISTORY & PHYSICAL   DATE OF ADMISSION:  October 01, 2011.  PRIMARY CARE PHYSICIAN:  Elby Showers, MD,  Bennye Alm.  CARDIOLOGIST:  Richard A. Alanda Amass, M.D.  CHIEF COMPLAINT:  Left-sided facial droop and weakness.  HISTORY OF PRESENT ILLNESS:  This is a 60 year old female with a previous history of a CVA in July 2012 of the left brain who began to have similar symptoms that began at 7:30 p.m. tonight.  The left side of her face began to droop.  She says the area felt numb and tingly.  The area also felt swollen.  She does report that she was having trouble with her swallowing at that time.  The patient reports that she had a headache, but did not think this was changed from her usual types of headaches that she has chronically.  She also reports beginning to feel anxious and having some chest pain and shortness of breath.  She presented to the hospital for evaluation at 7:35 and the code stroke protocol was initiated.  A CT scan of the head was performed, results of which did not reveal any acute findings.  Evidence of her previous CVA had been seen.  The old stroke was in the area of the anterior left periventricular white matter.  However, the CT report did state this could be an evolving infarct or chronic ischemic change.  While in the emergency department, the patient's symptoms did began to improve over time.  The patient began to have less drooping of her face and this was noted by staff.  The code stroke MD was notified and evaluated the patient and wanted the patient to transfer to [facility] to the telemetry neuro unit.  The patient was referred to the  hospitalist service.  The patient denies having any weakness on her left side or her right side.  She denied having any difficulty walking.  She denied having any visual changes.  PAST MEDICAL HISTORY:  Significant for previous CVA in July 2012, this was a left brain stroke; hypothyroidism; hypertension; dyslipidemia; degenerative disk disease; and carotid artery disease of the left carotid.  PAST SURGICAL HISTORY:  History of multiple injuries from accident with horses.  She has had the left [facility] joint removal after a horse fell on her, she has also had left great toe surgery, right navicular fracture which was pinned, and left great toe fracture surgery as well and left fifth toe surgery as well from fracture.  She has also had bunionectomy.  ALLERGIES:  To PENICILLIN, which causes swelling of the neck.  MEDICATIONS:  Include Plavix, estradiol, lisinopril, Livalo, oxycodone/APAP, Pristiq, and Valium.  SOCIAL HISTORY:  The patient is married.  She is a nonsmoker and nondrinker.  No history of illicit drug usage.  FAMILY HISTORY:  Positive for CVAs in both parents, peripheral vascular disease in her father, coronary artery disease  in her mother, hypertension in both parents, cancer in her brother who had lung cancer and was a smoker, and breast cancer in her mother.  REVIEW OF SYSTEMS:  Pertinent as mentioned above.  PHYSICAL EXAMINATION FINDINGS:  GENERAL:  This is a 60 year old pleasant well-nourished, well-developed Caucasian female who is in no acute distress. VITAL SIGNS: Temperature 98.9, blood pressure 133/90, heart rate 111 now 97, and respirations 16-20, and O2 saturations 100%. HEENT:  Normocephalic and atraumatic.  Pupils are equally round and reactive to light.  Extraocular movements are intact.  Funduscopic benign.  There is no scleral icterus.  Nares are patent bilaterally. Oropharynx is clear.  There is no tongue deviation.  At this particular time, there is no  facial drooping, which is apparent and facial muscle strength is intact. NECK:  Supple, full range of motion.  No thyromegaly, adenopathy, or jugular venous distention.  No carotid bruits are heard. CARDIOVASCULAR:  Regular rate and rhythm.  Normal S1 and S2.  No murmurs, gallops, or rubs appreciated. LUNGS:  Clear to auscultation bilaterally.  No rales, rhonchi, or wheezes.  Chest wall is nontender, chest wall excursion is symmetric and breathing is unlabored.  ABDOMEN:  Positive bowel sounds, soft, nontender, and nondistended.  No hepatosplenomegaly. EXTREMITIES:  Without cyanosis, clubbing, or edema. NEUROLOGIC:  The patient is alert and oriented x3.  Her cranial nerves are intact.  Motor and sensory function is also intact.  There is no facial drooping at this time.  There is no pronator drifting at this time.  She is able to move all four of her extremities.  Strength is 5/5.  Babinski signs are equivocal.  LABORATORY STUDIES:  Protime 13.0, INR 0.96, and PTT 27.  White blood cell count 8.9, hemoglobin 13.7, hematocrit 38.2, MCV 98.7, platelets 343, neutrophils 58% and lymphocytes 31%.  Sodium 138, potassium 3.1, chloride 106, CO2 22, BUN 19, creatinine 0.80, and glucose 106.  CT scan as mentioned above.  Chest x-ray reveals no evidence of acute pulmonary disease.  Old right rib fractures are seen and resection of the distal left clavicle is seen.  EKG reveals mild sinus tachycardia at a rate of 108; otherwise, there are no acute ST-segment changes that are seen.  ASSESSMENT:  This is a 60 year old female being admitted with, 1. Left brain stroke, chronic versus subacute. 2. Hypertension. 3. Hypokalemia. 4. Dyslipidemia or hyperlipidemia. 5. History of hypothyroidism. 6. Carotid artery disease. 7. Degenerative disk disease.  PLAN:  The patient will be transferred to the [facility] telemetry neuro unit on the 3000 wing.  She will be admitted to the [facility]  Team II.  She will undergo a CVA workup per the ischemic CVA protocol.  Neurology has also been consulted and will continue to follow this case as well.  The patient's regular medications will be further verified, p.r.n. medications for blood pressure will also be ordered, and per the protocol, the patient will have neurologic checks, cardiac enzymes, and fasting lipids checked.  Her TSH level will also be checked as well.  DVT prophylaxis has been ordered and the patient is a full code at this time.     Della Goo, M.D.     HJ/MEDQ  D:  10/01/2011  T:  10/01/2011  Job:  [id number]  Electronically Signed by Della Goo M.D. on 10/02/2011 02:12:02 AM

## 2011-10-03 LAB — B. BURGDORFI ANTIBODIES: B burgdorferi Ab IgG+IgM: 0.1 {ISR}

## 2011-10-05 LAB — URINALYSIS, ROUTINE W REFLEX MICROSCOPIC
Bilirubin Urine: NEGATIVE
Glucose, UA: NEGATIVE
Ketones, ur: NEGATIVE
Nitrite: NEGATIVE
Protein, ur: NEGATIVE
Specific Gravity, Urine: 1.023
Urobilinogen, UA: 0.2
pH: 6

## 2011-10-05 LAB — URINE CULTURE
Colony Count: NO GROWTH
Culture: NO GROWTH

## 2011-10-05 LAB — URINE MICROSCOPIC-ADD ON

## 2011-11-02 ENCOUNTER — Ambulatory Visit
Admission: RE | Admit: 2011-11-02 | Discharge: 2011-11-02 | Disposition: A | Discharge status: Home / Self Care | Payer: BC Managed Care – PPO | Source: Ambulatory Visit | Attending: Internal Medicine | Admitting: Internal Medicine

## 2011-11-02 ENCOUNTER — Other Ambulatory Visit: Payer: Self-pay | Admitting: Internal Medicine

## 2011-11-02 DIAGNOSIS — R05 Cough: Secondary | ICD-10-CM

## 2011-11-02 DIAGNOSIS — R059 Cough, unspecified: Secondary | ICD-10-CM

## 2011-11-02 DIAGNOSIS — R079 Chest pain, unspecified: Secondary | ICD-10-CM

## 2013-06-11 ENCOUNTER — Other Ambulatory Visit: Payer: Self-pay | Admitting: Cardiovascular Disease

## 2013-06-12 LAB — COMPREHENSIVE METABOLIC PANEL
ALT: 18 U/L (ref 0–35)
AST: 26 U/L (ref 0–37)
Albumin: 4.2 g/dL (ref 3.5–5.2)
Alkaline Phosphatase: 66 U/L (ref 39–117)
BUN: 9 mg/dL (ref 6–23)
CO2: 27 mEq/L (ref 19–32)
Calcium: 9.2 mg/dL (ref 8.4–10.5)
Chloride: 105 mEq/L (ref 96–112)
Creat: 0.74 mg/dL (ref 0.50–1.10)
Glucose, Bld: 83 mg/dL (ref 70–99)
Potassium: 3.6 mEq/L (ref 3.5–5.3)
Sodium: 141 mEq/L (ref 135–145)
Total Bilirubin: 0.5 mg/dL (ref 0.3–1.2)
Total Protein: 6.6 g/dL (ref 6.0–8.3)

## 2013-06-12 LAB — CBC WITH DIFFERENTIAL/PLATELET
Basophils Absolute: 0 10*3/uL (ref 0.0–0.1)
Basophils Relative: 1 % (ref 0–1)
Eosinophils Absolute: 0.5 10*3/uL (ref 0.0–0.7)
Eosinophils Relative: 7 % — ABNORMAL HIGH (ref 0–5)
HCT: 44 % (ref 36.0–46.0)
Hemoglobin: 15.5 g/dL — ABNORMAL HIGH (ref 12.0–15.0)
Lymphocytes Relative: 38 % (ref 12–46)
Lymphs Abs: 3 10*3/uL (ref 0.7–4.0)
MCH: 33.4 pg (ref 26.0–34.0)
MCHC: 35.2 g/dL (ref 30.0–36.0)
MCV: 94.8 fL (ref 78.0–100.0)
Monocytes Absolute: 0.6 10*3/uL (ref 0.1–1.0)
Monocytes Relative: 7 % (ref 3–12)
Neutro Abs: 3.8 10*3/uL (ref 1.7–7.7)
Neutrophils Relative %: 47 % (ref 43–77)
Platelets: 361 10*3/uL (ref 150–400)
RBC: 4.64 MIL/uL (ref 3.87–5.11)
RDW: 14.9 % (ref 11.5–15.5)
WBC: 7.9 10*3/uL (ref 4.0–10.5)

## 2013-06-12 LAB — TSH: TSH: 0.735 u[IU]/mL (ref 0.350–4.500)

## 2013-06-12 LAB — LIPID PANEL
Cholesterol: 232 mg/dL — ABNORMAL HIGH (ref 0–200)
HDL: 63 mg/dL (ref >39)
LDL Cholesterol: 119 mg/dL — ABNORMAL HIGH (ref 0–99)
Total CHOL/HDL Ratio: 3.7 Ratio
Triglycerides: 250 mg/dL — ABNORMAL HIGH (ref <150)
VLDL: 50 mg/dL — ABNORMAL HIGH (ref 0–40)

## 2013-08-27 ENCOUNTER — Other Ambulatory Visit: Payer: Self-pay | Admitting: *Deleted

## 2013-08-27 DIAGNOSIS — R0989 Other specified symptoms and signs involving the circulatory and respiratory systems: Secondary | ICD-10-CM

## 2013-09-05 ENCOUNTER — Emergency Department (HOSPITAL_COMMUNITY): Payer: BC Managed Care – PPO

## 2013-09-05 ENCOUNTER — Encounter (HOSPITAL_COMMUNITY): Payer: Self-pay

## 2013-09-05 ENCOUNTER — Observation Stay (HOSPITAL_COMMUNITY)
Admission: EM | Admit: 2013-09-05 | Discharge: 2013-09-06 | Disposition: A | Discharge status: Home / Self Care | Payer: BC Managed Care – PPO | Attending: Internal Medicine | Admitting: Internal Medicine

## 2013-09-05 DIAGNOSIS — Z7902 Long term (current) use of antithrombotics/antiplatelets: Secondary | ICD-10-CM | POA: Insufficient documentation

## 2013-09-05 DIAGNOSIS — I10 Essential (primary) hypertension: Secondary | ICD-10-CM

## 2013-09-05 DIAGNOSIS — Z79899 Other long term (current) drug therapy: Secondary | ICD-10-CM | POA: Insufficient documentation

## 2013-09-05 DIAGNOSIS — R29810 Facial weakness: Secondary | ICD-10-CM

## 2013-09-05 DIAGNOSIS — G459 Transient cerebral ischemic attack, unspecified: Principal | ICD-10-CM

## 2013-09-05 DIAGNOSIS — R269 Unspecified abnormalities of gait and mobility: Secondary | ICD-10-CM

## 2013-09-05 DIAGNOSIS — E876 Hypokalemia: Secondary | ICD-10-CM

## 2013-09-05 DIAGNOSIS — E785 Hyperlipidemia, unspecified: Secondary | ICD-10-CM

## 2013-09-05 DIAGNOSIS — Z720 Tobacco use: Secondary | ICD-10-CM

## 2013-09-05 DIAGNOSIS — F17200 Nicotine dependence, unspecified, uncomplicated: Secondary | ICD-10-CM

## 2013-09-05 HISTORY — DX: Unspecified osteoarthritis, unspecified site: M19.90

## 2013-09-05 HISTORY — DX: Transient cerebral ischemic attack, unspecified: G45.9

## 2013-09-05 LAB — CREATININE, SERUM
Creatinine, Ser: 0.7 mg/dL (ref 0.50–1.10)
GFR calc Af Amer: >90 mL/min (ref >90)
GFR calc non Af Amer: >90 mL/min (ref >90)

## 2013-09-05 LAB — CBC
HCT: 42.7 % (ref 36.0–46.0)
HCT: 42.3 % (ref 36.0–46.0)
Hemoglobin: 15.6 g/dL — ABNORMAL HIGH (ref 12.0–15.0)
Hemoglobin: 15.2 g/dL — ABNORMAL HIGH (ref 12.0–15.0)
MCH: 35 pg — ABNORMAL HIGH (ref 26.0–34.0)
MCH: 34.6 pg — ABNORMAL HIGH (ref 26.0–34.0)
MCHC: 36.5 g/dL — ABNORMAL HIGH (ref 30.0–36.0)
MCHC: 35.9 g/dL (ref 30.0–36.0)
MCV: 95.7 fL (ref 78.0–100.0)
MCV: 96.4 fL (ref 78.0–100.0)
Platelets: 286 10*3/uL (ref 150–400)
Platelets: 269 10*3/uL (ref 150–400)
RBC: 4.46 MIL/uL (ref 3.87–5.11)
RBC: 4.39 MIL/uL (ref 3.87–5.11)
RDW: 12.9 % (ref 11.5–15.5)
RDW: 12.8 % (ref 11.5–15.5)
WBC: 8.6 10*3/uL (ref 4.0–10.5)
WBC: 10.3 10*3/uL (ref 4.0–10.5)

## 2013-09-05 LAB — COMPREHENSIVE METABOLIC PANEL
ALT: 19 U/L (ref 0–35)
AST: 29 U/L (ref 0–37)
Albumin: 3.8 g/dL (ref 3.5–5.2)
Alkaline Phosphatase: 66 U/L (ref 39–117)
BUN: 18 mg/dL (ref 6–23)
CO2: 22 mEq/L (ref 19–32)
Calcium: 9.6 mg/dL (ref 8.4–10.5)
Chloride: 99 mEq/L (ref 96–112)
Creatinine, Ser: 0.64 mg/dL (ref 0.50–1.10)
GFR calc Af Amer: >90 mL/min (ref >90)
GFR calc non Af Amer: >90 mL/min (ref >90)
Glucose, Bld: 100 mg/dL — ABNORMAL HIGH (ref 70–99)
Potassium: 3.2 mEq/L — ABNORMAL LOW (ref 3.5–5.1)
Sodium: 135 mEq/L (ref 135–145)
Total Bilirubin: 0.6 mg/dL (ref 0.3–1.2)
Total Protein: 6.9 g/dL (ref 6.0–8.3)

## 2013-09-05 LAB — POCT I-STAT, CHEM 8
BUN: 18 mg/dL (ref 6–23)
Calcium, Ion: 1.2 mmol/L (ref 1.13–1.30)
Chloride: 104 mEq/L (ref 96–112)
Creatinine, Ser: 0.9 mg/dL (ref 0.50–1.10)
Glucose, Bld: 102 mg/dL — ABNORMAL HIGH (ref 70–99)
HCT: 47 % — ABNORMAL HIGH (ref 36.0–46.0)
Hemoglobin: 16 g/dL — ABNORMAL HIGH (ref 12.0–15.0)
Potassium: 2.9 mEq/L — ABNORMAL LOW (ref 3.5–5.1)
Sodium: 140 mEq/L (ref 135–145)
TCO2: 22 mmol/L (ref 0–100)

## 2013-09-05 LAB — DIFFERENTIAL
Basophils Absolute: 0 K/uL (ref 0.0–0.1)
Basophils Relative: 0 % (ref 0–1)
Eosinophils Absolute: 0.3 K/uL (ref 0.0–0.7)
Eosinophils Relative: 4 % (ref 0–5)
Lymphocytes Relative: 25 % (ref 12–46)
Lymphs Abs: 2.2 10*3/uL (ref 0.7–4.0)
Monocytes Absolute: 0.7 10*3/uL (ref 0.1–1.0)
Monocytes Relative: 8 % (ref 3–12)
Neutro Abs: 5.4 10*3/uL (ref 1.7–7.7)
Neutrophils Relative %: 63 % (ref 43–77)

## 2013-09-05 LAB — APTT: aPTT: 24 seconds (ref 24–37)

## 2013-09-05 LAB — PROTIME-INR
INR: 0.93 (ref 0.00–1.49)
Prothrombin Time: 12.3 seconds (ref 11.6–15.2)

## 2013-09-05 LAB — TROPONIN I: Troponin I: <0.3 ng/mL (ref <0.30)

## 2013-09-05 MED ORDER — ARIPIPRAZOLE 5 MG PO TABS
2.5000 mg | ORAL_TABLET | Freq: Every day | ORAL | Status: DC
Start: 2013-09-05 — End: 2013-09-06
  Administered 2013-09-05 – 2013-09-06 (×2): 2.5 mg via ORAL
  Filled 2013-09-05 (×2): qty 1

## 2013-09-05 MED ORDER — COENZYME Q10 30 MG PO CAPS: 30.0000 mg | ORAL_CAPSULE | Freq: Every day | ORAL | Status: DC

## 2013-09-05 MED ORDER — ADULT MULTIVITAMIN W/MINERALS CH
1.0000 | ORAL_TABLET | Freq: Every day | ORAL | Status: DC
  Administered 2013-09-06: 1 via ORAL
  Filled 2013-09-05 (×2): qty 1

## 2013-09-05 MED ORDER — TETRAHYDROZOLINE HCL 0.05 % OP SOLN: 1.0000 [drp] | Freq: Two times a day (BID) | OPHTHALMIC | Status: DC | PRN

## 2013-09-05 MED ORDER — HYDROCHLOROTHIAZIDE 25 MG PO TABS
25.0000 mg | ORAL_TABLET | Freq: Every day | ORAL | Status: DC
  Administered 2013-09-06: 25 mg via ORAL
  Filled 2013-09-05: qty 1

## 2013-09-05 MED ORDER — CENTRUM PO CHEW: 1.0000 | CHEWABLE_TABLET | Freq: Every day | ORAL | Status: DC

## 2013-09-05 MED ORDER — ENOXAPARIN SODIUM 40 MG/0.4ML ~~LOC~~ SOLN
40.0000 mg | SUBCUTANEOUS | Status: DC
  Administered 2013-09-05: 40 mg via SUBCUTANEOUS
  Filled 2013-09-05 (×2): qty 0.4

## 2013-09-05 MED ORDER — FLUTICASONE FUROATE 27.5 MCG/SPRAY NA SUSP: 2.0000 | NASAL | Status: DC | PRN

## 2013-09-05 MED ORDER — METOPROLOL SUCCINATE ER 25 MG PO TB24
25.0000 mg | ORAL_TABLET | Freq: Every day | ORAL | Status: DC
  Administered 2013-09-05: 25 mg via ORAL
  Filled 2013-09-05 (×2): qty 1

## 2013-09-05 MED ORDER — LISINOPRIL 20 MG PO TABS
30.0000 mg | ORAL_TABLET | Freq: Every day | ORAL | Status: DC
  Administered 2013-09-06: 30 mg via ORAL
  Filled 2013-09-05: qty 1

## 2013-09-05 MED ORDER — SODIUM CHLORIDE 0.9 % IJ SOLN
3.0000 mL | Freq: Two times a day (BID) | INTRAMUSCULAR | Status: DC
  Administered 2013-09-06: 3 mL via INTRAVENOUS

## 2013-09-05 MED ORDER — TRAMADOL HCL 50 MG PO TABS
50.0000 mg | ORAL_TABLET | Freq: Four times a day (QID) | ORAL | Status: DC | PRN
  Administered 2013-09-05 – 2013-09-06 (×2): 50 mg via ORAL
  Filled 2013-09-05 (×2): qty 1

## 2013-09-05 MED ORDER — ASPIRIN 325 MG PO TABS
325.0000 mg | ORAL_TABLET | Freq: Every day | ORAL | Status: DC
  Administered 2013-09-05 – 2013-09-06 (×2): 325 mg via ORAL
  Filled 2013-09-05 (×2): qty 1

## 2013-09-05 MED ORDER — POTASSIUM CHLORIDE CRYS ER 20 MEQ PO TBCR
40.0000 meq | EXTENDED_RELEASE_TABLET | Freq: Once | ORAL | Status: AC
  Administered 2013-09-05: 40 meq via ORAL
  Filled 2013-09-05: qty 2

## 2013-09-05 MED ORDER — SODIUM CHLORIDE 0.9 % IJ SOLN: 3.0000 mL | INTRAMUSCULAR | Status: DC | PRN

## 2013-09-05 MED ORDER — SODIUM CHLORIDE 0.9 % IV SOLN: 250.0000 mL | INTRAVENOUS | Status: DC | PRN

## 2013-09-05 MED ORDER — LISINOPRIL-HYDROCHLOROTHIAZIDE 20-25 MG PO TABS: 1.0000 | ORAL_TABLET | Freq: Every day | ORAL | Status: DC

## 2013-09-05 MED ORDER — IPRATROPIUM-ALBUTEROL 20-100 MCG/ACT IN AERS: 2.0000 | INHALATION_SPRAY | Freq: Four times a day (QID) | RESPIRATORY_TRACT | Status: DC | PRN

## 2013-09-05 MED ORDER — KRILL OIL 300 MG PO CAPS: 300.0000 mg | ORAL_CAPSULE | Freq: Every day | ORAL | Status: DC

## 2013-09-05 MED ORDER — ATORVASTATIN CALCIUM 20 MG PO TABS
20.0000 mg | ORAL_TABLET | Freq: Every day | ORAL | Status: DC
  Administered 2013-09-06: 20 mg via ORAL
  Filled 2013-09-05 (×2): qty 1

## 2013-09-05 MED ORDER — FLUTICASONE PROPIONATE 50 MCG/ACT NA SUSP: 2.0000 | Freq: Every day | NASAL | Status: DC | PRN

## 2013-09-05 MED ORDER — LISINOPRIL 10 MG PO TABS: 10.0000 mg | ORAL_TABLET | Freq: Every day | ORAL | Status: DC

## 2013-09-05 MED ORDER — CLOPIDOGREL BISULFATE 75 MG PO TABS
75.0000 mg | ORAL_TABLET | Freq: Every evening | ORAL | Status: DC
  Administered 2013-09-05 – 2013-09-06 (×2): 75 mg via ORAL
  Filled 2013-09-05 (×2): qty 1

## 2013-09-05 MED ORDER — VENLAFAXINE HCL ER 75 MG PO CP24
75.0000 mg | ORAL_CAPSULE | Freq: Every day | ORAL | Status: DC
  Administered 2013-09-06: 75 mg via ORAL
  Filled 2013-09-05 (×3): qty 1

## 2013-09-05 NOTE — ED Provider Notes (Signed)
CSN: [id number]     Arrival date & time 09/05/13  0933 History   First MD Initiated Contact with Patient 09/05/13 0945     Chief Complaint  Patient presents with  . Facial Droop   (Consider location/radiation/quality/duration/timing/severity/associated sxs/prior Treatment) HPI Comments: Brenda Carney is a 62 y.o. female who presents for evaluation of "droopy" left face. She noticed this upon awakening this morning. She has also had some mild difficulty with her left hand for 2 days, which she noted when she was attempting to break her horse's mane. She denies headache. She was able to eat some crackers this morning. She has not noted altered taste sensation. She denies fever, chills, nausea, vomiting, trouble walking, chest pain, dysuria, or urinary frequency. She had a mild headache. This morning, but took 4 baby aspirin and headache, resolved. There are no other known modifying factors.   The history is provided by the patient.    Past Medical History  Diagnosis Date  . TIA (transient ischemic attack)   . Hypertension   . Hyperlipidemia   . Arthritis   . Asthma     exercise induced   Past Surgical History  Procedure Laterality Date  . 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 History  Substance Use Topics  . Smoking status: Current Some Day Smoker  . Smokeless tobacco: Not on file  . Alcohol Use: Yes     Comment: ocassional wine   OB History   Grav Para Term Preterm Abortions TAB SAB Ect Mult Living                 Review of Systems  All other systems reviewed and are negative.    Allergies  Penicillins  Home Medications   Current Outpatient Rx  Name  Route  Sig  Dispense  Refill  . ARIPiprazole (ABILIFY) 5 MG tablet   Oral   Take 2.5 mg by mouth daily.         Marland Kitchen aspirin EC 81 MG tablet   Oral   Take 81 mg by mouth 2 (two) times daily.         . clopidogrel (PLAVIX) 75 MG tablet    Oral   Take 75 mg by mouth every evening.         Marland Kitchen co-enzyme Q-10 30 MG capsule   Oral   Take 30 mg by mouth daily.         Marland Kitchen desvenlafaxine (PRISTIQ) 100 MG 24 hr tablet   Oral   Take 100 mg by mouth daily.         . fluticasone (VERAMYST) 27.5 MCG/SPRAY nasal spray   Each Nare   Place 2 sprays into both nostrils as needed for rhinitis or allergies.         . Ipratropium-Albuterol (COMBIVENT RESPIMAT) 20-100 MCG/ACT AERS respimat   Inhalation   Inhale 2 puffs into the lungs every 6 (six) hours as needed for wheezing.         Boris Lown Oil 300 MG CAPS   Oral   Take 300 mg by mouth daily.         Marland Kitchen lisinopril (PRINIVIL,ZESTRIL) 10 MG tablet   Oral   Take 10 mg by mouth daily.         Marland Kitchen lisinopril-hydrochlorothiazide (PRINZIDE,ZESTORETIC) 20-25 MG per tablet   Oral   Take 1 tablet by mouth daily.         Marland Kitchen  metoprolol succinate (TOPROL-XL) 25 MG 24 hr tablet   Oral   Take 25 mg by mouth every evening.         . multivitamin-iron-minerals-folic acid (CENTRUM) chewable tablet   Oral   Chew 1 tablet by mouth daily.         . Pitavastatin Calcium (LIVALO) 4 MG TABS   Oral   Take 4 mg by mouth daily.         . Tetrahydrozoline HCl (VISINE OP)   Both Eyes   Place 1 drop into both eyes daily as needed (dry eyes).          BP 162/102  Pulse 67  Temp(Src) 97.3 F (36.3 C) (Oral)  Resp 20  Ht 5\' 5"  (1.651 m)  Wt 149 lb (67.586 kg)  BMI 24.79 kg/m2  SpO2 99% Physical Exam  Nursing note and vitals reviewed. Constitutional: She is oriented to person, place, and time. She appears well-developed and well-nourished.  HENT:  Head: Normocephalic and atraumatic.  Eyes: Conjunctivae and EOM are normal. Pupils are equal, round, and reactive to light.  Neck: Normal range of motion and phonation normal. Neck supple.  Cardiovascular: Normal rate, regular rhythm and intact distal pulses.   Pulmonary/Chest: Effort normal and breath sounds normal. She  exhibits no tenderness.  Abdominal: Soft. She exhibits no distension. There is no tenderness. There is no guarding.  Musculoskeletal: Normal range of motion.  Neurological: She is alert and oriented to person, place, and time. She has normal strength. She exhibits normal muscle tone.  Very minimal left mid face droop. No weakness, or for or platysma muscles. No dysarthria, no aphasia. No ataxia or nystagmus.  Skin: Skin is warm and dry.  Psychiatric: She has a normal mood and affect. Her behavior is normal. Judgment and thought content normal.    ED Course  Procedures (including critical care time)  10:00- CT for evaluation of CVA, deferred due out of stroke window, by timing; and minimal deficits, that would be unlikely to be diagnosed on CT. She is not a TPA or direct intervention candidate.  Case discussed with neural hospitalist, who will see the patient as a consultant  Case discussed with the medical hospitalist, who will admit the patient  Patient Vitals for the past 24 hrs:  BP Temp Temp src Pulse Resp SpO2 Height Weight  09/05/13 1648 162/102 mmHg 97.3 F (36.3 C) Oral 67 20 99 % - -  09/05/13 1315 136/92 mmHg - - 72 19 97 % - -  09/05/13 1100 150/83 mmHg 98 F (36.7 C) Oral 71 20 95 % - -  09/05/13 1004 - 98.2 F (36.8 C) - - - - - -  09/05/13 0945 162/92 mmHg - - 79 14 99 % - -  09/05/13 0939 168/115 mmHg 98.2 F (36.8 C) Oral - 20 - 5\' 5"  (1.651 m) 149 lb (67.586 kg)     Date: 09/05/13  Rate: 80  Rhythm: normal sinus rhythm  QRS Axis: normal  PR and QT Intervals: normal  ST/T Wave abnormalities: normal  PR and QRS Conduction Disutrbances:none  Narrative Interpretation:   Old EKG Reviewed: changes noted- 10/01/2011, rate slower    Labs Review Labs Reviewed  CBC - Abnormal; Notable for the following:    Hemoglobin 15.6 (*)    MCH 35.0 (*)    MCHC 36.5 (*)    All other components within normal limits  COMPREHENSIVE METABOLIC PANEL - Abnormal; Notable for the  following:  Potassium 3.2 (*)    Glucose, Bld 100 (*)    All other components within normal limits  POCT I-STAT, CHEM 8 - Abnormal; Notable for the following:    Potassium 2.9 (*)    Glucose, Bld 102 (*)    Hemoglobin 16.0 (*)    HCT 47.0 (*)    All other components within normal limits  PROTIME-INR  APTT  DIFFERENTIAL  TROPONIN I  LIPID PANEL   Imaging Review Mr Brain Wo Contrast  09/05/2013   CLINICAL DATA:  Left-sided facial droop.  EXAM: MRI HEAD WITHOUT CONTRAST  TECHNIQUE: Multiplanar, multisequence MR imaging was performed. No intravenous contrast was administered.  COMPARISON:  10/02/2011  FINDINGS: Diffusion imaging does not show any acute or subacute infarction. There are mild chronic small-vessel changes of the pons. No focal cerebellar insult. The cerebral hemispheres show chronic appearing small vessel infarctions affecting the deep and subcortical white matter. No cortical or large vessel territory infarction. No mass lesion, hemorrhage, hydrocephalus or extra-axial collection. Compared to the previous study, the white matter disease is somewhat progressive.  IMPRESSION: No acute or subacute to infarction. 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   Electronically Signed   By: Paulina Fusi M.D.   On: 09/05/2013 14:45    MDM   1. TIA (transient ischemic attack)   2. Dyslipidemia   3. HTN (hypertension)   4. Tobacco abuse    Evaluation consistent with TIA, she needs further evaluation and inpatient setting, for that   Nursing Notes Reviewed/ Care Coordinated, and agree without changes. Applicable Imaging Reviewed.  Interpretation of Laboratory Data incorporated into ED treatment  Plan: Admit    Flint Melter, MD 09/05/13 1610

## 2013-09-05 NOTE — ED Notes (Signed)
Dr. Wentz back at the bedside.  

## 2013-09-05 NOTE — ED Notes (Signed)
Dr. Wentz at the bedside.  

## 2013-09-05 NOTE — ED Notes (Signed)
Notified RN of CBG 107 

## 2013-09-05 NOTE — ED Notes (Signed)
Pt back from MRI 

## 2013-09-05 NOTE — ED Notes (Signed)
Pt arrived by POV. C/o going to bed last night at around 2000. Woke up this morning at 0700 with left sided facial droop. 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

## 2013-09-05 NOTE — Consult Note (Signed)
NEURO HOSPITALIST CONSULT NOTE    Reason for Consult: left face droopiness, left hand clumsiness, imbalance.   HPI:                                                                                                                                          Brenda Carney is an 62 y.o. female with a past medical history significant for HTN, hyperlipidemia, TIA's x 5, asthma, arthritis, who was in her usual state of health until this morning when she woke up with a sensation of " swollen and droopy left face". At the same time, she noticed some impairment performing fine motor movements with the left hand and some imbalance. Stated that she had had prior TIA's with left face droopiness. Brenda Carney said that the whole episode today lasted for a couple of hours. No associated vertigo, double vision, difficulty swallowing, confusion, or visual disturbances. MRI-DWI brain revealed no acute abnormality. She takes aspirin and plavix daily.   Past Medical History  Diagnosis Date  . TIA (transient ischemic attack)   . Hypertension   . Hyperlipidemia   . Arthritis   . Asthma     exercise induced    Past Surgical History  Procedure Laterality Date  . 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   Social History:  reports that she has been smoking.  She does not have any smokeless tobacco history on file. She reports that  drinks alcohol. She reports that she does not use illicit drugs.  Allergies  Allergen Reactions  . Penicillins Swelling    Childhood reaction- tongue swelled    MEDICATIONS:                                                                                                                     I have reviewed the patient's current medications.   ROS:  History  obtained from the patient ad chart review.  General ROS: negative for - chills, fatigue, fever, night sweats, weight gain or weight loss Psychological ROS: negative for - behavioral disorder, hallucinations, memory difficulties, mood swings or suicidal ideation Ophthalmic ROS: negative for - blurry vision, double vision, eye pain or loss of vision ENT ROS: negative for - epistaxis, nasal discharge, oral lesions, sore throat, tinnitus or vertigo Allergy and Immunology ROS: negative for - hives or itchy/watery eyes Hematological and Lymphatic ROS: negative for - bleeding problems, bruising or swollen lymph nodes Endocrine ROS: negative for - galactorrhea, hair pattern changes, polydipsia/polyuria or temperature intolerance Respiratory ROS: negative for - cough, hemoptysis, shortness of breath or wheezing Cardiovascular ROS: negative for - chest pain, dyspnea on exertion, edema or irregular heartbeat Gastrointestinal ROS: negative for - abdominal pain, diarrhea, hematemesis, nausea/vomiting or stool incontinence Genito-Urinary ROS: negative for - dysuria, hematuria, incontinence or urinary frequency/urgency Musculoskeletal ROS: negative for - joint swelling Neurological ROS: as noted in HPI Dermatological ROS: negative for rash and skin lesion changes   Physical exam: pleasant female in no apparent distress.Blood pressure 162/102, pulse 67, temperature 97.3 F (36.3 C), temperature source Oral, resp. rate 20, height 5\' 5"  (1.651 m), weight 67.586 kg (149 lb), SpO2 99.00%.  Head: normocephalic. Neck: supple, no bruits, no JVD. Cardiac: no murmurs. Lungs: clear. Abdomen: soft, no tender, no mass. Extremities: no edema.  Neurologic Examination:                                                                                                      Mental Status: Alert, awake, oriented x 4, thought content appropriate. Comprehension, naming, and repetition intact. Speech fluent without evidence of  aphasia.  Able to follow 3 step commands without difficulty. Cranial Nerves: II: Discs flat bilaterally; Visual fields grossly normal, pupils equal, round, reactive to light and accommodation III,IV, VI: ptosis not present, extra-ocular motions intact bilaterally V: facial light touch sensation normal bilaterally VII: subtle left face weakness? VIII: hearing normal bilaterally IX,X: gag reflex present XI: bilateral shoulder shrug XII: midline tongue extension Motor: Right : Upper extremity   5/5    Left:     Upper extremity   5/5  Lower extremity   5/5     Lower extremity   5/5 Tone and bulk:normal tone throughout; no atrophy noted Sensory: Pinprick and light touch intact throughout, bilaterally Deep Tendon Reflexes:  2 all over Plantars: Right: downgoing   Left: downgoing Cerebellar: normal finger-to-nose,  normal heel-to-shin test Gait:  No ataxia. CV: pulses palpable throughout    Lab Results  Component Value Date/Time   CHOL 232* 06/11/2013  1:37 PM    Results for orders placed during the hospital encounter of 09/05/13 (from the past 48 hour(s))  PROTIME-INR     Status: None   Collection Time    09/05/13  9:58 AM      Result Value Range   Prothrombin Time 12.3  11.6 - 15.2 seconds   INR 0.93  0.00 - 1.49  APTT     Status: None  Collection Time    09/05/13  9:58 AM      Result Value Range   aPTT 24  24 - 37 seconds  CBC     Status: Abnormal   Collection Time    09/05/13  9:58 AM      Result Value Range   WBC 8.6  4.0 - 10.5 K/uL   RBC 4.46  3.87 - 5.11 MIL/uL   Hemoglobin 15.6 (*) 12.0 - 15.0 g/dL   HCT 60.4  54.0 - 98.1 %   MCV 95.7  78.0 - 100.0 fL   MCH 35.0 (*) 26.0 - 34.0 pg   MCHC 36.5 (*) 30.0 - 36.0 g/dL   RDW 19.1  47.8 - 29.5 %   Platelets 286  150 - 400 K/uL  DIFFERENTIAL     Status: None   Collection Time    09/05/13  9:58 AM      Result Value Range   Neutrophils Relative % 63  43 - 77 %   Neutro Abs 5.4  1.7 - 7.7 K/uL   Lymphocytes  Relative 25  12 - 46 %   Lymphs Abs 2.2  0.7 - 4.0 K/uL   Monocytes Relative 8  3 - 12 %   Monocytes Absolute 0.7  0.1 - 1.0 K/uL   Eosinophils Relative 4  0 - 5 %   Eosinophils Absolute 0.3  0.0 - 0.7 K/uL   Basophils Relative 0  0 - 1 %   Basophils Absolute 0.0  0.0 - 0.1 K/uL  COMPREHENSIVE METABOLIC PANEL     Status: Abnormal   Collection Time    09/05/13  9:58 AM      Result Value Range   Sodium 135  135 - 145 mEq/L   Potassium 3.2 (*) 3.5 - 5.1 mEq/L   Chloride 99  96 - 112 mEq/L   CO2 22  19 - 32 mEq/L   Glucose, Bld 100 (*) 70 - 99 mg/dL   BUN 18  6 - 23 mg/dL   Creatinine, Ser 6.21  0.50 - 1.10 mg/dL   Calcium 9.6  8.4 - 30.8 mg/dL   Total Protein 6.9  6.0 - 8.3 g/dL   Albumin 3.8  3.5 - 5.2 g/dL   AST 29  0 - 37 U/L   ALT 19  0 - 35 U/L   Alkaline Phosphatase 66  39 - 117 U/L   Total Bilirubin 0.6  0.3 - 1.2 mg/dL   GFR calc non Af Amer >90  >90 mL/min   GFR calc Af Amer >90  >90 mL/min   Comment: (NOTE)     The eGFR has been calculated using the CKD EPI equation.     This calculation has not been validated in all clinical situations.     eGFR's persistently <90 mL/min signify possible Chronic Kidney     Disease.  TROPONIN I     Status: None   Collection Time    09/05/13  9:58 AM      Result Value Range   Troponin I <0.30  <0.30 ng/mL   Comment:            Due to the release kinetics of cTnI,     a negative result within the first hours     of the onset of symptoms does not rule out     myocardial infarction with certainty.     If myocardial infarction is still suspected,     repeat the test at  appropriate intervals.  POCT I-STAT, CHEM 8     Status: Abnormal   Collection Time    09/05/13 10:11 AM      Result Value Range   Sodium 140  135 - 145 mEq/L   Potassium 2.9 (*) 3.5 - 5.1 mEq/L   Chloride 104  96 - 112 mEq/L   BUN 18  6 - 23 mg/dL   Creatinine, Ser 1.61  0.50 - 1.10 mg/dL   Glucose, Bld 096 (*) 70 - 99 mg/dL   Calcium, Ion 0.45  4.09 - 1.30  mmol/L   TCO2 22  0 - 100 mmol/L   Hemoglobin 16.0 (*) 12.0 - 15.0 g/dL   HCT 81.1 (*) 91.4 - 78.2 %    Mr Brain Wo Contrast  09/05/2013   CLINICAL DATA:  Left-sided facial droop.  EXAM: MRI HEAD WITHOUT CONTRAST  TECHNIQUE: Multiplanar, multisequence MR imaging was performed. No intravenous contrast was administered.  COMPARISON:  10/02/2011  FINDINGS: Diffusion imaging does not show any acute or subacute infarction. There are mild chronic small-vessel changes of the pons. No focal cerebellar insult. The cerebral hemispheres show chronic appearing small vessel infarctions affecting the deep and subcortical white matter. No cortical or large vessel territory infarction. No mass lesion, hemorrhage, hydrocephalus or extra-axial collection. Compared to the previous study, the white matter disease is somewhat progressive.  IMPRESSION: No acute or subacute to infarction. 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   Electronically Signed   By: Paulina Fusi M.D.   On: 09/05/2013 14:45     Assessment/Plan: 62 Y/O with probable right brain TIA. Will suggest admission to the hospital in order to complete TIA work up. Continue current antiplatelets. Will follow up.  Wyatt Portela, MD [facility] [phone]  09/05/2013, 5:49 PM

## 2013-09-05 NOTE — Progress Notes (Signed)
PHARMACIST - PHYSICIAN ORDER COMMUNICATION  CONCERNING: P&T Medication Policy on Herbal Medications  DESCRIPTION:  This patient's order for:  Co Q10 and Krill Oil  has been noted.  This product(s) is classified as an "herbal" or natural product. Due to a lack of definitive safety studies or FDA approval, nonstandard manufacturing practices, plus the potential risk of unknown drug-drug interactions while on inpatient medications, the Pharmacy and Therapeutics Committee does not permit the use of "herbal" or natural products of this type within [facility].   ACTION TAKEN: The pharmacy department is unable to verify this order at this time and your patient has been informed of this safety policy. Please reevaluate patient's clinical condition at discharge and address if the herbal or natural product(s) should be resumed at that time.  Wilfred Lacy, PharmD Clinical Pharmacist [phone] 09/05/2013, 8:00 PM

## 2013-09-05 NOTE — H&P (Signed)
PATIENT DETAILS Name: Brenda Carney Age: 62 y.o. Sex: female Date of Birth: 02/21/1951 Admit Date: 09/05/2013 ZOX:[id number], MD   CHIEF COMPLAINT:  Left facial droop since she woke up this morning  HPI: Brenda Carney is a 62 y.o. 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 The patient, she was in her usual state of health and was last seen normal when she went to bed yesterday evening. She claims she woke up this morning with left facial droop, as is her she presented to the emergency room. Her left-sided facial weakness is significantly, MRI of the brain was done by the emergency room physician, this is negative for CVA. However given her recurrent TIAs, neurology consultation was obtained, who suggested further inpatient workup. I was subsequently asked to admit this patient for further evaluation and treatment. No associated vertigo, double vision, difficulty swallowing, confusion, or visual disturbances.  ALLERGIES:   Allergies  Allergen Reactions  . Penicillins Swelling    Childhood reaction- tongue swelled    PAST MEDICAL HISTORY: Past Medical History  Diagnosis Date  . TIA (transient ischemic attack)   . Hypertension   . Hyperlipidemia   . Arthritis   . Asthma     exercise induced    PAST SURGICAL HISTORY: Past Surgical History  Procedure Laterality Date  . 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 Authorizing Provider  ARIPiprazole (ABILIFY) 5 MG tablet Take 2.5 mg by mouth daily.   Yes Historical Provider, MD  aspirin EC 81 MG tablet Take 81 mg by mouth 2 (two) times daily.   Yes Historical Provider, MD  clopidogrel (PLAVIX) 75 MG tablet Take 75 mg by mouth every evening.   Yes  Historical Provider, MD  co-enzyme Q-10 30 MG capsule Take 30 mg by mouth daily.   Yes Historical Provider, MD  desvenlafaxine (PRISTIQ) 100 MG 24 hr tablet Take 100 mg by mouth daily.   Yes Historical Provider, MD  fluticasone (VERAMYST) 27.5 MCG/SPRAY nasal spray Place 2 sprays into both nostrils as needed for rhinitis or allergies.   Yes Historical Provider, MD  Ipratropium-Albuterol (COMBIVENT RESPIMAT) 20-100 MCG/ACT AERS respimat Inhale 2 puffs into the lungs every 6 (six) hours as needed for wheezing.   Yes Historical Provider, MD  Boris Lown Oil 300 MG CAPS Take 300 mg by mouth daily.   Yes Historical Provider, MD  lisinopril (PRINIVIL,ZESTRIL) 10 MG tablet Take 10 mg by mouth daily.   Yes Historical Provider, MD  lisinopril-hydrochlorothiazide (PRINZIDE,ZESTORETIC) 20-25 MG per tablet Take 1 tablet by mouth daily.   Yes Historical Provider, MD  metoprolol succinate (TOPROL-XL) 25 MG 24 hr tablet Take 25 mg by mouth every evening.   Yes Historical Provider, MD  multivitamin-iron-minerals-folic acid (CENTRUM) chewable tablet Chew 1 tablet by mouth daily.   Yes Historical Provider, MD  Pitavastatin Calcium (LIVALO) 4 MG TABS Take 4 mg by mouth daily.   Yes Historical Provider, MD  Tetrahydrozoline HCl (VISINE OP) Place 1 drop into both eyes daily as needed (dry eyes).   Yes Historical Provider, MD    FAMILY HISTORY: No family history on file.  SOCIAL HISTORY:  reports that she has been smoking.  She does not have any smokeless tobacco history  on file. She reports that  drinks alcohol. She reports that she does not use illicit drugs.  REVIEW OF SYSTEMS:  Constitutional:   No  weight loss, night sweats,  Fevers, chills, fatigue.  HEENT:    No headaches, Difficulty swallowing,Tooth/dental problems,Sore throat,  No sneezing, itching, ear ache, nasal congestion, post nasal drip,   Cardio-vascular: No chest pain,  Orthopnea, PND, swelling in lower extremities, anasarca,         dizziness,  palpitations  GI:  No heartburn, indigestion, abdominal pain, nausea, vomiting, diarrhea, change in       bowel habits, loss of appetite  Resp: No shortness of breath with exertion or at rest.  No excess mucus, no productive cough, No non-productive cough,  No coughing up of blood.No change in color of mucus.No wheezing.No chest wall deformity  Skin:  no rash or lesions.  GU:  no dysuria, change in color of urine, no urgency or frequency.  No flank pain.  Musculoskeletal: No joint pain or swelling.  No decreased range of motion.  No back pain.  Psych: No change in mood or affect. No depression or anxiety.  No memory loss.   PHYSICAL EXAM: Blood pressure 162/102, pulse 67, temperature 97.3 F (36.3 C), temperature source Oral, resp. rate 20, height 5\' 5"  (1.651 m), weight 67.586 kg (149 lb), SpO2 99.00%.  General appearance :Awake, alert, not in any distress. Speech Clear. Not toxic Looking HEENT: Atraumatic and Normocephalic, pupils equally reactive to light and accomodation Neck: supple, no JVD. No cervical lymphadenopathy.  Chest:Good air entry bilaterally, no added sounds  CVS: S1 S2 regular, no murmurs.  Abdomen: Bowel sounds present, Non tender and not distended with no gaurding, rigidity or rebound. Extremities: B/L Lower Ext shows no edema, both legs are warm to touch Neurology: Awake alert, and oriented X 3, CN II-XII intact, Non focal Skin:No Rash Wounds:N/A  LABS ON ADMISSION:   Recent Labs  09/05/13 0958 09/05/13 1011  NA 135 140  K 3.2* 2.9*  CL 99 104  CO2 22  --   GLUCOSE 100* 102*  BUN 18 18  CREATININE 0.64 0.90  CALCIUM 9.6  --     Recent Labs  09/05/13 0958  AST 29  ALT 19  ALKPHOS 66  BILITOT 0.6  PROT 6.9  ALBUMIN 3.8   No results found for this basename: LIPASE, AMYLASE,  in the last 72 hours  Recent Labs  09/05/13 0958 09/05/13 1011  WBC 8.6  --   NEUTROABS 5.4  --   HGB 15.6* 16.0*  HCT 42.7 47.0*  MCV 95.7  --   PLT 286   --     Recent Labs  09/05/13 0958  TROPONINI <0.30   No results found for this basename: DDIMER,  in the last 72 hours No components found with this basename: POCBNP,    RADIOLOGIC STUDIES ON ADMISSION: Mr Brain Wo Contrast  09/05/2013   CLINICAL DATA:  Left-sided facial droop.  EXAM: MRI HEAD WITHOUT CONTRAST  TECHNIQUE: Multiplanar, multisequence MR imaging was performed. No intravenous contrast was administered.  COMPARISON:  10/02/2011  FINDINGS: Diffusion imaging does not show any acute or subacute infarction. There are mild chronic small-vessel changes of the pons. No focal cerebellar insult. The cerebral hemispheres show chronic appearing small vessel infarctions affecting the deep and subcortical white matter. No cortical or large vessel territory infarction. No mass lesion, hemorrhage, hydrocephalus or extra-axial collection. Compared to the previous study, the white matter disease is somewhat progressive.  IMPRESSION: No acute or subacute to infarction. 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   Electronically Signed   By: Paulina Fusi M.D.   On: 09/05/2013 14:45     EKG: Independently reviewed. NSR  ASSESSMENT AND PLAN: Present on Admission:  . TIA (transient ischemic attack) - Recurrent TIAs, has had approximately 5 TIAs in the past. Already on aspirin and Plavix. Seen by neurology and recommended to have inpatient workup  - Admit to telemetry, get MRA brain, echo and carotid Doppler  - Will order A1c and lipid panel  - Continue with aspirin and Plavix, we'll defer whether or not to change to Aggrenox to neurology   . HTN (hypertension) - Continue metoprolol, lisinopril and HCTZ   . Dyslipidemia - Continue statin   . Hypokalemia - Speck from HCTZ therapy, replete potassium and recheck in  . Tobacco abuse - Counseled extensively  Further plan will depend as patient's clinical course evolves and further  radiologic and laboratory data become available. Patient will be monitored closely.  DVT Prophylaxis: Prophylactic Lovenox  Code Status: Full Code Total time spent for admission equals 45 minutes.  [facility] Pager [phone]  If 7PM-7AM, please contact night-coverage [email or web address] Password [facility] 09/05/2013, 6:23 PM

## 2013-09-05 NOTE — ED Notes (Signed)
Heart healthy tray ordered at 16:58

## 2013-09-05 NOTE — ED Notes (Signed)
Received report on pt who is MRI.

## 2013-09-06 ENCOUNTER — Observation Stay (HOSPITAL_COMMUNITY): Payer: BC Managed Care – PPO

## 2013-09-06 DIAGNOSIS — G459 Transient cerebral ischemic attack, unspecified: Secondary | ICD-10-CM

## 2013-09-06 DIAGNOSIS — E876 Hypokalemia: Secondary | ICD-10-CM

## 2013-09-06 LAB — LIPID PANEL
Cholesterol: 213 mg/dL — ABNORMAL HIGH (ref 0–200)
HDL: 50 mg/dL (ref >39)
LDL Cholesterol: 89 mg/dL (ref 0–99)
Total CHOL/HDL Ratio: 4.3 RATIO
Triglycerides: 368 mg/dL — ABNORMAL HIGH (ref <150)
VLDL: 74 mg/dL — ABNORMAL HIGH (ref 0–40)

## 2013-09-06 LAB — BASIC METABOLIC PANEL
BUN: 14 mg/dL (ref 6–23)
CO2: 26 mEq/L (ref 19–32)
Calcium: 9.4 mg/dL (ref 8.4–10.5)
Chloride: 100 mEq/L (ref 96–112)
Creatinine, Ser: 0.7 mg/dL (ref 0.50–1.10)
GFR calc Af Amer: >90 mL/min (ref >90)
GFR calc non Af Amer: >90 mL/min (ref >90)
Glucose, Bld: 83 mg/dL (ref 70–99)
Potassium: 4.1 mEq/L (ref 3.5–5.1)
Sodium: 135 mEq/L (ref 135–145)

## 2013-09-06 LAB — HEMOGLOBIN A1C
Hgb A1c MFr Bld: 5.6 % (ref <5.7)
Mean Plasma Glucose: 114 mg/dL (ref <117)

## 2013-09-06 MED ORDER — POTASSIUM CHLORIDE CRYS ER 20 MEQ PO TBCR
40.0000 meq | EXTENDED_RELEASE_TABLET | Freq: Once | ORAL | Status: AC
  Administered 2013-09-06: 40 meq via ORAL
  Filled 2013-09-06: qty 2

## 2013-09-06 MED ORDER — POTASSIUM CHLORIDE ER 10 MEQ PO TBCR: 10.0000 meq | EXTENDED_RELEASE_TABLET | Freq: Every day | ORAL | Status: DC

## 2013-09-06 NOTE — Progress Notes (Addendum)
Pt sent down for MRA via w/CMT notified to  Pt returned to unit at 1650 cmt notified

## 2013-09-06 NOTE — Progress Notes (Signed)
[facility] PROGRESS NOTE  Brenda Carney:[id number] DOB: Sep 18, 1951 DOA: 09/05/2013 PCP: Elby Showers, MD  Assessment/Plan: 62 y.o. 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 H/o similar episodes x 5   1. Possible TIA (transient ischemic attack); Recurrent symptoms x 5; MRI no acute infarct  -? Cervical neuropathy; per patient some episodes happen after neck pain  -pend echo and carotid Doppler   A1c-5.6  and lipid panel  - Continue with aspirin and Plavix; further recommendations per neurology  -appreciate neurology input    2. HTN (hypertension)  - Continue metoprolol, lisinopril and HCTZ   3. Dyslipidemia  - Continue statin   4. Hypokalemia  - Speck from HCTZ therapy, replete potassium and recheck   5. Tobacco abuse  - Counseled extensively   Code Status: full  Family Communication: with her husband at the bedside  (indicate person spoken with, relationship, and if by phone, the number) Disposition Plan: hom e? Today    Consultants:  Neurology   Procedures:  MRI   Antibiotics:  No  (indicate start date, and stop date if known)  HPI/Subjective: Alert, awake   Objective: Filed Vitals:   09/06/13 1404  BP: 137/93  Pulse: 64  Temp: 97.6 F (36.4 C)  Resp: 16   No intake or output data in the 24 hours ending 09/06/13 1452 Filed Weights   09/05/13 0939 09/05/13 2000  Weight: 67.586 kg (149 lb) 67.586 kg (149 lb)    Exam:   General:  Alert, awake   Cardiovascular: S1, S2 rrr  Respiratory: CTA bl   Abdomen: soft, NT, ND   Musculoskeletal: no LE edema    Data Reviewed: Basic Metabolic Panel:  Recent Labs Lab 09/05/13 0958 09/05/13 1011 09/05/13 2027  NA 135 140  --   K 3.2* 2.9*  --   CL 99 104  --   CO2 22  --   --   GLUCOSE 100* 102*  --   BUN 18 18  --    CREATININE 0.64 0.90 0.70  CALCIUM 9.6  --   --    Liver Function Tests:  Recent Labs Lab 09/05/13 0958  AST 29  ALT 19  ALKPHOS 66  BILITOT 0.6  PROT 6.9  ALBUMIN 3.8   No results found for this basename: LIPASE, AMYLASE,  in the last 168 hours No results found for this basename: AMMONIA,  in the last 168 hours CBC:  Recent Labs Lab 09/05/13 0958 09/05/13 1011 09/05/13 2027  WBC 8.6  --  10.3  NEUTROABS 5.4  --   --   HGB 15.6* 16.0* 15.2*  HCT 42.7 47.0* 42.3  MCV 95.7  --  96.4  PLT 286  --  269   Cardiac Enzymes:  Recent Labs Lab 09/05/13 0958  TROPONINI <0.30   BNP (last 3 results) No results found for this basename: PROBNP,  in the last 8760 hours CBG: No results found for this basename: GLUCAP,  in the last 168 hours  No results found for this or any previous visit (from the past 240 hour(s)).   Studies: [name] Wo Contrast  09/06/2013   CLINICAL DATA:  Left facial drooping.  Facial swelling. TIA.  EXAM: MRA HEAD WITHOUT CONTRAST  TECHNIQUE: Multiplanar, multisequence MR imaging was performed. No intravenous contrast was administered.  COMPARISON:  MRI  brain 09/05/2013. MRI brain and MRA head 10/02/2011.  FINDINGS: The internal carotid arteries demonstrate mild irregularity within the cavernous segments bilaterally. 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 The A1 and M1 segments are normal. No definite anterior communicating artery is evident. 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 The ACA and MCA branch vessels are otherwise within normal limits.  The vertebral arteries are codominant. The PICA origins are visualized and within normal limits. The basilar artery is normal. Both posterior cerebral arteries originate from the basilar tip. The PCA branch vessels are within normal limits.  IMPRESSION: 1. 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 2. Mild  atherosclerotic irregularity within the cavernous segments is also stable. 3. 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   Electronically Signed   By: Gennette Pac   On: 09/06/2013 11:26   Mr Brain Wo Contrast  09/05/2013   CLINICAL DATA:  Left-sided facial droop.  EXAM: MRI HEAD WITHOUT CONTRAST  TECHNIQUE: Multiplanar, multisequence MR imaging was performed. No intravenous contrast was administered.  COMPARISON:  10/02/2011  FINDINGS: Diffusion imaging does not show any acute or subacute infarction. There are mild chronic small-vessel changes of the pons. No focal cerebellar insult. The cerebral hemispheres show chronic appearing small vessel infarctions affecting the deep and subcortical white matter. No cortical or large vessel territory infarction. No mass lesion, hemorrhage, hydrocephalus or extra-axial collection. Compared to the previous study, the white matter disease is somewhat progressive.  IMPRESSION: No acute or subacute to infarction. 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   Electronically Signed   By: Paulina Fusi M.D.   On: 09/05/2013 14:45    Scheduled Meds: . ARIPiprazole  2.5 mg Oral Daily  . aspirin  325 mg Oral Daily  . atorvastatin  20 mg Oral q1800  . clopidogrel  75 mg Oral QPM  . enoxaparin (LOVENOX) injection  40 mg Subcutaneous Q24H  . hydrochlorothiazide  25 mg Oral Daily  . lisinopril  30 mg Oral Daily  . metoprolol succinate  25 mg Oral QHS  . multivitamin with minerals  1 tablet Oral Daily  . sodium chloride  3 mL Intravenous Q12H  . venlafaxine XR  75 mg Oral Q breakfast   Continuous Infusions:   Principal Problem:   TIA (transient ischemic attack) Active Problems:   HTN (hypertension)   Dyslipidemia   Tobacco abuse    Time spent: > 25 minutes     Esperanza Sheets  [facility] Pager [phone]. If 7PM-7AM, please contact night-coverage at [email or web address],  password [facility] 09/06/2013, 2:52 PM  LOS: 1 day

## 2013-09-06 NOTE — Progress Notes (Signed)
VASCULAR LAB PRELIMINARY  PRELIMINARY  PRELIMINARY  PRELIMINARY  Carotid Dopplers completed.    Preliminary report:  There is 1-39% ICA stenosis.  Vertebral artery flow is antegrade.  Correna Meacham, RVT 09/06/2013, 10:45 AM

## 2013-09-06 NOTE — Discharge Summary (Signed)
Physician Discharge Summary  SERRIA SLOMA ZOX:[id number] DOB: Mar 16, 1951 DOA: 09/05/2013  PCP: Elby Showers, MD  Admit date: 09/05/2013 Discharge date: 09/06/2013  Time spent: > 35 minutes  Recommendations for Outpatient Follow-up:  F/u with PCP in 1 week   Discharge Diagnoses:  Principal Problem:   TIA (transient ischemic attack) Active Problems:   HTN (hypertension)   Dyslipidemia   Tobacco abuse   Discharge Condition: stable   Diet recommendation: heart healthy   Filed Weights   09/05/13 0939 09/05/13 2000  Weight: 67.586 kg (149 lb) 67.586 kg (149 lb)    History of present illness:  62 y.o. 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 H/o similar episodes x 5   Hospital Course:   1. Possible TIA (transient ischemic attack); Recurrent symptoms x 5; MRI no acute infarct; echo LVEF, no obvious thrombus;  55%; carotids < 39 BL;  -? Cervical neuropathy; per patient some episodes happen after neck pain  -pend echo and carotid Doppler A1c-5.6 and lipid panel  - Continue with aspirin and Plavix; further recommendations per neurology   2. HTN (hypertension)  - Continue metoprolol, lisinopril and HCTZ   3. Dyslipidemia  - Continue statin  4. Hypokalemia  - Speck from HCTZ therapy, repleted   5. Tobacco abuse  - Counseled extensively  Patient is being discharged in stable condition; will need outpatient follow up with possible cervical MRI   Procedures: CT, MRI as avove  Consultations:  Neurology   Discharge Exam: Filed Vitals:   09/06/13 1404  BP: 137/93  Pulse: 64  Temp: 97.6 F (36.4 C)  Resp: 16    General: alert Cardiovascular: S1, S2 RRR Respiratory: cta bl   Discharge Instructions  Discharge Orders   Future Appointments Provider Department Dept Phone   09/10/2013 12:30 PM  [facility] [place] [facility] [address] [phone]   Future Orders Complete By Expires   Diet - low sodium heart healthy  As directed    Discharge instructions  As directed    Comments:     Please follow up with primary care doctor in 1 week to check potassium level   Increase activity slowly  As directed        Medication List    STOP taking these medications       lisinopril 10 MG tablet  Commonly known as:  PRINIVIL,ZESTRIL      TAKE these medications       ARIPiprazole 5 MG tablet  Commonly known as:  ABILIFY  Take 2.5 mg by mouth daily.     aspirin EC 81 MG tablet  Take 81 mg by mouth 2 (two) times daily.     clopidogrel 75 MG tablet  Commonly known as:  PLAVIX  Take 75 mg by mouth every evening.     co-enzyme Q-10 30 MG capsule  Take 30 mg by mouth daily.     COMBIVENT RESPIMAT 20-100 MCG/ACT Aers respimat  Generic drug:  Ipratropium-Albuterol  Inhale 2 puffs into the lungs every 6 (six) hours as needed for wheezing.     desvenlafaxine 100 MG 24 hr tablet  Commonly known as:  PRISTIQ  Take 100 mg by mouth daily.     Krill Oil 300 MG Caps  Take 300 mg by mouth daily.     lisinopril-hydrochlorothiazide 20-25 MG per  tablet  Commonly known as:  PRINZIDE,ZESTORETIC  Take 1 tablet by mouth daily.     LIVALO 4 MG Tabs  Generic drug:  Pitavastatin Calcium  Take 4 mg by mouth daily.     metoprolol succinate 25 MG 24 hr tablet  Commonly known as:  TOPROL-XL  Take 25 mg by mouth every evening.     multivitamin-iron-minerals-folic acid chewable tablet  Chew 1 tablet by mouth daily.     potassium chloride 10 MEQ tablet  Commonly known as:  K-DUR  Take 1 tablet (10 mEq total) by mouth daily.     VERAMYST 27.5 MCG/SPRAY nasal spray  Generic drug:  fluticasone  Place 2 sprays into both nostrils as needed for rhinitis or allergies.     VISINE OP  Place 1 drop into both eyes daily as needed (dry eyes).       Allergies   Allergen Reactions  . Penicillins Swelling    Childhood reaction- tongue swelled       Follow-up Information   Follow up with Elby Showers, MD In 1 week.   Specialty:  Internal Medicine   Contact information:   [facility] [address] [place] [address] [phone]        The results of significant diagnostics from this hospitalization (including imaging, microbiology, ancillary and laboratory) are listed below for reference.    Significant Diagnostic Studies: [name] Wo Contrast  09/06/2013   CLINICAL DATA:  Left facial drooping.  Facial swelling. TIA.  EXAM: MRA HEAD WITHOUT CONTRAST  TECHNIQUE: Multiplanar, multisequence MR imaging was performed. No intravenous contrast was administered.  COMPARISON:  MRI brain 09/05/2013. MRI brain and MRA head 10/02/2011.  FINDINGS: The internal carotid arteries demonstrate mild irregularity within the cavernous segments bilaterally. 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 The A1 and M1 segments are normal. No definite anterior communicating artery is evident. 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 The ACA and MCA branch vessels are otherwise within normal limits.  The vertebral arteries are codominant. The PICA origins are visualized and within normal limits. The basilar artery is normal. Both posterior cerebral arteries originate from the basilar tip. The PCA branch vessels are within normal limits.  IMPRESSION: 1. 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 2. Mild atherosclerotic irregularity within the cavernous segments is also stable. 3. 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   Electronically Signed   By: Gennette Pac   On: 09/06/2013 11:26   Mr Brain Wo Contrast  09/05/2013   CLINICAL DATA:  Left-sided facial droop.  EXAM: MRI HEAD WITHOUT CONTRAST  TECHNIQUE: Multiplanar,  multisequence MR imaging was performed. No intravenous contrast was administered.  COMPARISON:  10/02/2011  FINDINGS: Diffusion imaging does not show any acute or subacute infarction. There are mild chronic small-vessel changes of the pons. No focal cerebellar insult. The cerebral hemispheres show chronic appearing small vessel infarctions affecting the deep and subcortical white matter. No cortical or large vessel territory infarction. No mass lesion, hemorrhage, hydrocephalus or extra-axial collection. Compared to the previous study, the white matter disease is somewhat progressive.  IMPRESSION: No acute or subacute to infarction. 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   Electronically Signed   By: Paulina Fusi M.D.   On: 09/05/2013 14:45    Microbiology: No results found for this  or any previous visit (from the past 240 hour(s)).   Labs: Basic Metabolic Panel:  Recent Labs Lab 09/05/13 0958 09/05/13 1011 09/05/13 2027  NA 135 140  --   K 3.2* 2.9*  --   CL 99 104  --   CO2 22  --   --   GLUCOSE 100* 102*  --   BUN 18 18  --   CREATININE 0.64 0.90 0.70  CALCIUM 9.6  --   --    Liver Function Tests:  Recent Labs Lab 09/05/13 0958  AST 29  ALT 19  ALKPHOS 66  BILITOT 0.6  PROT 6.9  ALBUMIN 3.8   No results found for this basename: LIPASE, AMYLASE,  in the last 168 hours No results found for this basename: AMMONIA,  in the last 168 hours CBC:  Recent Labs Lab 09/05/13 0958 09/05/13 1011 09/05/13 2027  WBC 8.6  --  10.3  NEUTROABS 5.4  --   --   HGB 15.6* 16.0* 15.2*  HCT 42.7 47.0* 42.3  MCV 95.7  --  96.4  PLT 286  --  269   Cardiac Enzymes:  Recent Labs Lab 09/05/13 0958  TROPONINI <0.30   BNP: BNP (last 3 results) No results found for this basename: PROBNP,  in the last 8760 hours CBG: No results found for this basename: GLUCAP,  in the last 168 hours     Signed:  Esperanza Sheets  [facility] 09/06/2013, 4:37 PM

## 2013-09-06 NOTE — Progress Notes (Signed)
  Echocardiogram 2D Echocardiogram has been performed.  Brenda Carney 09/06/2013, 5:50 PM

## 2013-09-06 NOTE — Progress Notes (Signed)
Pt c/o tooth pain. Pt does have appointment for tooth to be pulled on Monday. Requesting tramadol for pain. Order received. Will continue to monitor.

## 2013-09-07 HISTORY — PX: TRANSTHORACIC ECHOCARDIOGRAM: SHX275

## 2013-09-07 NOTE — Progress Notes (Signed)
Patient received discharge instructions and was able to teach back.  Patient was in stable condition at discharge.  Patient's IV was removed and was clean, dry, and intact with no bleeding.  Patient was escorted to vehicle via wheelchair by nurse tech.  Ellene Route 09/06/2013

## 2013-09-08 LAB — GLUCOSE, CAPILLARY: Glucose-Capillary: 107 mg/dL — ABNORMAL HIGH (ref 70–99)

## 2013-09-09 ENCOUNTER — Telehealth: Payer: Self-pay | Admitting: Cardiovascular Disease

## 2013-09-09 NOTE — Telephone Encounter (Signed)
Message forwarded to [facility]. Berlinda Last, LPN to discuss w/ Dr. Alanda Amass.  This note and paper chart# [id number] placed on Dr. Kandis Cocking cart.

## 2013-09-09 NOTE — Telephone Encounter (Signed)
Pt needs a call back about her having tooth extraction. She needs some type of clearance.

## 2013-09-10 ENCOUNTER — Emergency Department (HOSPITAL_COMMUNITY)
Admission: EM | Admit: 2013-09-10 | Discharge: 2013-09-10 | Discharge status: Against Medical Advice | Payer: BC Managed Care – PPO | Attending: Emergency Medicine | Admitting: Emergency Medicine

## 2013-09-10 ENCOUNTER — Encounter (HOSPITAL_COMMUNITY): Payer: BC Managed Care – PPO

## 2013-09-10 ENCOUNTER — Encounter (HOSPITAL_COMMUNITY): Payer: Self-pay | Admitting: *Deleted

## 2013-09-10 DIAGNOSIS — F1010 Alcohol abuse, uncomplicated: Secondary | ICD-10-CM | POA: Insufficient documentation

## 2013-09-10 DIAGNOSIS — Z7902 Long term (current) use of antithrombotics/antiplatelets: Secondary | ICD-10-CM | POA: Insufficient documentation

## 2013-09-10 DIAGNOSIS — R040 Epistaxis: Secondary | ICD-10-CM | POA: Insufficient documentation

## 2013-09-10 NOTE — ED Notes (Signed)
The pt has a strong odor of alcohol on her breath

## 2013-09-10 NOTE — ED Notes (Signed)
The pt has has a nose bleed for one hour. No bleeding at present.  She is on plavix

## 2013-09-15 NOTE — Telephone Encounter (Signed)
Pt. Given instructions to hold xarelto 4,5 days prior to procedure and restart afterwards

## 2013-09-19 ENCOUNTER — Encounter: Payer: Self-pay | Admitting: Cardiovascular Disease

## 2013-09-22 ENCOUNTER — Other Ambulatory Visit: Payer: Self-pay | Admitting: Cardiovascular Disease

## 2013-09-22 LAB — CBC WITH DIFFERENTIAL/PLATELET
Basophils Absolute: 0.1 10*3/uL (ref 0.0–0.1)
Basophils Relative: 1 % (ref 0–1)
Eosinophils Absolute: 0.4 10*3/uL (ref 0.0–0.7)
Eosinophils Relative: 4 % (ref 0–5)
HCT: 44.6 % (ref 36.0–46.0)
Hemoglobin: 15.5 g/dL — ABNORMAL HIGH (ref 12.0–15.0)
Lymphocytes Relative: 28 % (ref 12–46)
Lymphs Abs: 2.6 10*3/uL (ref 0.7–4.0)
MCH: 33.6 pg (ref 26.0–34.0)
MCHC: 34.8 g/dL (ref 30.0–36.0)
MCV: 96.7 fL (ref 78.0–100.0)
Monocytes Absolute: 0.7 10*3/uL (ref 0.1–1.0)
Monocytes Relative: 7 % (ref 3–12)
Neutro Abs: 5.8 10*3/uL (ref 1.7–7.7)
Neutrophils Relative %: 60 % (ref 43–77)
Platelets: 411 10*3/uL — ABNORMAL HIGH (ref 150–400)
RBC: 4.61 MIL/uL (ref 3.87–5.11)
RDW: 13.5 % (ref 11.5–15.5)
WBC: 9.5 10*3/uL (ref 4.0–10.5)

## 2013-09-22 LAB — SEDIMENTATION RATE: Sed Rate: 1 mm/hr (ref 0–22)

## 2013-09-22 LAB — VITAMIN B12: Vitamin B-12: 293 pg/mL (ref 211–911)

## 2013-09-23 LAB — ANA: Anti Nuclear Antibody(ANA): NEGATIVE

## 2013-10-03 ENCOUNTER — Encounter: Payer: Self-pay | Admitting: Neurology

## 2013-10-07 ENCOUNTER — Encounter: Payer: Self-pay | Admitting: Neurology

## 2013-10-07 ENCOUNTER — Ambulatory Visit (INDEPENDENT_AMBULATORY_CARE_PROVIDER_SITE_OTHER): Payer: BC Managed Care – PPO | Admitting: Neurology

## 2013-10-07 VITALS — BP 152/99 | HR 78 | Ht 65.5 in | Wt 156.0 lb

## 2013-10-07 DIAGNOSIS — R51 Headache: Secondary | ICD-10-CM

## 2013-10-07 DIAGNOSIS — R519 Headache, unspecified: Secondary | ICD-10-CM | POA: Insufficient documentation

## 2013-10-07 MED ORDER — LISINOPRIL-HYDROCHLOROTHIAZIDE 20-25 MG PO TABS: 1.0000 | ORAL_TABLET | Freq: Every day | ORAL | Status: DC

## 2013-10-07 MED ORDER — LISINOPRIL 10 MG PO TABS: 20.0000 mg | ORAL_TABLET | Freq: Every day | ORAL | Status: DC

## 2013-10-07 NOTE — Progress Notes (Signed)
Reason for visit: Headache  Brenda Carney is a 62 y.o. female  History of present illness:  Brenda Carney is a 62 year old right-handed white female with a history of chronic daily headaches that she claims has been present virtually throughout her entire adult wife. The patient believes that there have been at least 40 years of daily headaches. The patient indicates that the headaches are mainly behind the eyes, and in the frontal areas with sharp pains at times. The patient has had at least 5 episodes in the past of left facial weakness, tunnel vision, and difficulty with cognitive processing lasting several hours. The patient has had several TIA workups that have been unremarkable. The patient does have a mild to moderate level of chronic white matter changes by MRI the brain. The last TIA workup occurred around 09/05/2013. The patient has chronic neck discomfort as well. The patient indicates that occasionally her hands will tingle, and she relates this to her neck issues. The patient also has chronic low back pain. The patient goes on to say that in the past, she had several injections several years ago with Botox on the face for cosmetic reasons. The patient noted that her headaches disappeared for almost 4 months. The patient currently takes Excedrin Migraine almost on a daily basis for her headaches. The patient denies any weakness of the extremities, or problems with control of the bowels or the bladder. The patient has some slight imbalance problems. The patient does have photophobia and phonophobia with headache. The patient denies any nausea or vomiting with the headache. The patient is sent to this office for an evaluation. 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  Past Medical History  Diagnosis Date  . TIA (transient ischemic attack)   . Hypertension   .  Hyperlipidemia   . Arthritis   . Asthma     exercise induced  . Headache(784.0) 10/07/2013  . 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 Cervical spondylosis   . Lumbosacral spondylosis   . Migraine headache   . History of cerebrovascular disease     Past Surgical History  Procedure Laterality Date  . 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 Foot fracture surgery      Multiple procedures, bilateral feet  . Left a.c. joint surgery      Family History  Problem Relation Age of Onset  . Stroke Father   . Heart attack Mother   . Stroke Mother     Social history:  reports that she has quit smoking. She has never used smokeless tobacco. She reports that she drinks alcohol. She reports that she does not use illicit drugs.  Medications:  Current Outpatient Prescriptions on File Prior to Visit  Medication Sig Dispense Refill  . ARIPiprazole (ABILIFY) 5 MG tablet Take 2.5 mg by mouth daily.      Marland Kitchen aspirin EC 81 MG tablet Take 81 mg by mouth 2 (two) times daily.      . carisoprodol (SOMA) 350 MG tablet Take 350 mg by mouth as needed.      . clopidogrel (PLAVIX) 75 MG tablet Take 75 mg by mouth every evening.      Marland Kitchen co-enzyme Q-10 30 MG capsule Take 30 mg by mouth daily.      Marland Kitchen  desvenlafaxine (PRISTIQ) 100 MG 24 hr tablet Take 100 mg by mouth daily.      Marland Kitchen estradiol (ESTRACE) 1 MG tablet Take 1 mg by mouth daily.      . fluticasone (VERAMYST) 27.5 MCG/SPRAY nasal spray Place 2 sprays into both nostrils as needed for rhinitis or allergies.      Marland Kitchen HYDROcodone-acetaminophen (NORCO/VICODIN) 5-325 MG per tablet Take 5-325 tablets by mouth as needed.      . Ipratropium-Albuterol (COMBIVENT RESPIMAT) 20-100 MCG/ACT AERS respimat Inhale 2 puffs into the lungs every 6 (six) hours as needed for wheezing.      Boris Lown Oil 300 MG CAPS Take 300 mg by mouth daily.      . metoprolol succinate  (TOPROL-XL) 25 MG 24 hr tablet Take 25 mg by mouth every evening.      . multivitamin-iron-minerals-folic acid (CENTRUM) chewable tablet Chew 1 tablet by mouth daily.      Marland Kitchen oxyCODONE-acetaminophen (PERCOCET/ROXICET) 5-325 MG per tablet Take 5-325 tablets by mouth daily as needed.      . Pitavastatin Calcium (LIVALO) 4 MG TABS Take 4 mg by mouth daily.      . Tetrahydrozoline HCl (VISINE OP) Place 1 drop into both eyes daily as needed (dry eyes).      Marland Kitchen VALIUM 10 MG tablet Take 10 mg by mouth 2 (two) times daily.      . potassium chloride (K-DUR) 10 MEQ tablet Take 1 tablet (10 mEq total) by mouth daily.  10 tablet  0   No current facility-administered medications on file prior to visit.      Allergies  Allergen Reactions  . Penicillins Swelling    Childhood reaction- tongue swelled    ROS:  Out of a complete 14 system review of symptoms, the patient complains only of the following symptoms, and all other reviewed systems are negative.  Headache Memory disturbance Chronic neck and low back pain  Blood pressure 152/99, pulse 78, height 5' 5.5" (1.664 m), weight 156 lb (70.761 kg).  Physical Exam  General: The patient is alert and cooperative at the time of the examination.  Head: Pupils are equal, round, and reactive to light. Discs are flat bilaterally.  Neck: The neck is supple, no carotid bruits are noted.  Respiratory: The respiratory examination is clear.  Cardiovascular: The cardiovascular examination reveals a regular rate and rhythm, no obvious murmurs or rubs are noted.  Neuromuscular: The patient has decreased range of movement of the cervical spine, lacking 20 of lateral rotation of the cervical spine bilaterally.   Skin: Extremities are without significant edema.  Neurologic Exam  Mental status:  Cranial nerves: Facial symmetry is present. There is good sensation of the face to pinprick and soft touch bilaterally. The strength of the facial muscles and the  muscles to head turning and shoulder shrug are normal bilaterally. Speech is well enunciated, no aphasia or dysarthria is noted. Extraocular movements are full. Visual fields are full.  Motor: The motor testing reveals 5 over 5 strength of all 4 extremities. Good symmetric motor tone is noted throughout.  Sensory: Sensory testing is intact to pinprick, soft touch, vibration sensation, and position sense on all 4 extremities. No evidence of extinction is noted.  Coordination: Cerebellar testing reveals good finger-nose-finger and heel-to-shin bilaterally.  Gait and station: Gait is normal. Tandem gait is slightly unsteady. Romberg is negative. No drift is seen.  Reflexes: Deep tendon reflexes are symmetric and normal bilaterally, with the exception that the ankle jerk reflexes  are depressed bilaterally. Toes are downgoing bilaterally.   Assessment/Plan:  1. Chronic daily headache  2. 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 The episodes of left facial droop, clouding of consciousness, and tunnel vision likely represent migraine events. In the past, the patient has gained significant benefit with her headaches with Botox injections for cosmetic reasons. I will try to get the patient set up for Botox therapy for her headache. The patient will followup otherwise in 4-6 months. The cerebral aneurysms or nonsurgical, only be followed over time. Risk of hemorrhage is low given the size of the aneurysms, and the distribution within the anterior circulation.  Marlan Palau MD 10/07/2013 5:14 PM  [facility] [address] [place], [place] [address]  Phone [phone] Fax [phone]

## 2013-10-08 ENCOUNTER — Ambulatory Visit: Payer: BC Managed Care – PPO | Admitting: Neurology

## 2013-10-15 ENCOUNTER — Telehealth: Payer: Self-pay | Admitting: Neurology

## 2013-10-15 MED ORDER — TOPIRAMATE 25 MG PO TABS: ORAL_TABLET | ORAL | Status: DC

## 2013-10-15 NOTE — Telephone Encounter (Signed)
I called the patient. I got a letter from her insurance company indicating that the Botox treatments were denied for use of headache. The insurance company indicated that the Botox is considered an investigational therapy for migraine, although it was approved by the FDA 2 years ago for treatment of migraine. I did discuss the frequent use of Excedrin Migraine in rebound headaches. We will give a trial on Topamax. The patient is already on a beta blocker.. We already know however that the Botox is extremely effective for the headaches. The insurance company is requesting at least 2 drugs be failed for treatment of migraine before Botox is considered.

## 2014-02-19 ENCOUNTER — Ambulatory Visit: Payer: BC Managed Care – PPO | Admitting: Podiatry

## 2014-03-24 ENCOUNTER — Ambulatory Visit: Payer: BC Managed Care – PPO | Admitting: Cardiology

## 2014-04-07 ENCOUNTER — Encounter: Payer: Self-pay | Admitting: *Deleted

## 2014-04-08 ENCOUNTER — Ambulatory Visit: Payer: BC Managed Care – PPO | Admitting: Cardiology

## 2014-04-09 ENCOUNTER — Ambulatory Visit: Payer: BC Managed Care – PPO | Admitting: Cardiology

## 2014-04-10 ENCOUNTER — Ambulatory Visit: Payer: BC Managed Care – PPO | Admitting: Neurology

## 2014-05-14 ENCOUNTER — Ambulatory Visit: Payer: BC Managed Care – PPO | Admitting: Cardiology

## 2014-05-25 ENCOUNTER — Other Ambulatory Visit: Payer: Self-pay | Admitting: Pain Medicine

## 2014-05-25 DIAGNOSIS — M545 Low back pain, unspecified: Secondary | ICD-10-CM

## 2014-05-27 ENCOUNTER — Ambulatory Visit
Admission: RE | Admit: 2014-05-27 | Discharge: 2014-05-27 | Disposition: A | Discharge status: Home / Self Care | Payer: BC Managed Care – PPO | Source: Ambulatory Visit | Attending: Pain Medicine | Admitting: Pain Medicine

## 2014-05-27 DIAGNOSIS — M545 Low back pain, unspecified: Secondary | ICD-10-CM

## 2014-06-09 ENCOUNTER — Ambulatory Visit (INDEPENDENT_AMBULATORY_CARE_PROVIDER_SITE_OTHER): Payer: BC Managed Care – PPO | Admitting: Cardiology

## 2014-06-09 ENCOUNTER — Encounter: Payer: Self-pay | Admitting: Cardiology

## 2014-06-09 VITALS — BP 138/98 | HR 75 | Ht 65.0 in | Wt 160.8 lb

## 2014-06-09 DIAGNOSIS — Z720 Tobacco use: Secondary | ICD-10-CM

## 2014-06-09 DIAGNOSIS — E785 Hyperlipidemia, unspecified: Secondary | ICD-10-CM

## 2014-06-09 DIAGNOSIS — G459 Transient cerebral ischemic attack, unspecified: Secondary | ICD-10-CM

## 2014-06-09 DIAGNOSIS — I679 Cerebrovascular disease, unspecified: Secondary | ICD-10-CM

## 2014-06-09 DIAGNOSIS — Z79899 Other long term (current) drug therapy: Secondary | ICD-10-CM

## 2014-06-09 DIAGNOSIS — F17200 Nicotine dependence, unspecified, uncomplicated: Secondary | ICD-10-CM

## 2014-06-09 DIAGNOSIS — I10 Essential (primary) hypertension: Secondary | ICD-10-CM

## 2014-06-09 NOTE — Patient Instructions (Signed)
Labs- lipid cmp  2 week - nurse visit for blood pressure check.  Your physician wants you to follow-up in 6 month Dr Ellyn Hack.  You will receive a reminder letter in the mail two months in advance. If you don't receive a letter, please call our office to schedule the follow-up appointment.

## 2014-06-10 ENCOUNTER — Encounter: Payer: Self-pay | Admitting: Cardiovascular Disease

## 2014-06-11 ENCOUNTER — Encounter: Payer: Self-pay | Admitting: Adult Health

## 2014-06-11 ENCOUNTER — Ambulatory Visit (INDEPENDENT_AMBULATORY_CARE_PROVIDER_SITE_OTHER): Payer: BC Managed Care – PPO | Admitting: Adult Health

## 2014-06-11 VITALS — BP 119/83 | HR 93 | Ht 64.5 in | Wt 151.0 lb

## 2014-06-11 DIAGNOSIS — R51 Headache: Secondary | ICD-10-CM

## 2014-06-11 DIAGNOSIS — R42 Dizziness and giddiness: Secondary | ICD-10-CM

## 2014-06-11 NOTE — Progress Notes (Signed)
PATIENT: Brenda Carney DOB: 1951-09-11  REASON FOR VISIT: follow up HISTORY FROM: patient  HISTORY OF PRESENT ILLNESS: Brenda Carney is a 63 year old female with a history of chronic migraines. She returns today for followup. The patient currently takes Topamax and states that she had to stop due to dry mouth. In the past for migraine she has tried Excedrin Migraine and is currently on a beta blocker with no relief. She has tried Botox for cosmetic reasons and notice that it was beneficial for her headaches. After receiving the Botox she was headache free for 4 months. Patient continues to have a daily headache. States that the headache is in the frontal region bilaterally. Sometimes feels nauseous but denies vomiting. Patient states that light makes it worse but denies phonophobia. Patient is having some dizziness but only when she sits up in bed in the morning, after a hour or so it will subside. She describes the sensation as the room spinning. When this happens she also feels faint. This does not occur every morning. So far it has happened approximately 3-4 times. She also states she got dizzy when kneeling down at her barn and then standing up immediately. Patient does have chronic low back pain and is on several pain medications for that. No new medical issues since the last visit.   HISTORY 10/07/2013 (CW): Brenda Carney is a 63 year old right-handed white female with a history of chronic daily headaches that she claims has been present virtually throughout her entire adult wife. The patient believes that there have been at least 40 years of daily headaches. The patient indicates that the headaches are mainly behind the eyes, and in the frontal areas with sharp pains at times. The patient has had at least 5 episodes in the past of left facial weakness, tunnel vision, and difficulty with cognitive processing lasting several hours. The patient has had several TIA workups that have been unremarkable.  The patient does have a mild to moderate level of chronic white matter changes by MRI the brain. The last TIA workup occurred around 09/05/2013. The patient has chronic neck discomfort as well. The patient indicates that occasionally her hands will tingle, and she relates this to her neck issues. The patient also has chronic low back pain. The patient goes on to say that in the past, she had several injections several years ago with Botox on the face for cosmetic reasons. The patient noted that her headaches disappeared for almost 4 months. The patient currently takes Excedrin Migraine almost on a daily basis for her headaches. The patient denies any weakness of the extremities, or problems with control of the bowels or the bladder. The patient has some slight imbalance problems. The patient does have photophobia and phonophobia with headache. The patient denies any nausea or vomiting with the headache. The patient is sent to this office for an evaluation. 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  REVIEW OF SYSTEMS: Full 14 system review of systems performed and notable only for:  Constitutional: N/A  Eyes: N/A Ear/Nose/Throat: N/A  Skin: N/A  Cardiovascular: N/A  Respiratory: N/A  Gastrointestinal: N/A  Genitourinary: N/A Hematology/Lymphatic: N/A  Endocrine: N/A Musculoskeletal:N/A  Allergy/Immunology: N/A  Neurological: N/A Psychiatric: N/A Sleep: N/A   ALLERGIES: Allergies  Allergen Reactions  . Lyrica [Pregabalin] Other (See Comments)    DRY MOUTH  .  Penicillins Swelling    Childhood reaction- tongue swelled    HOME MEDICATIONS: Outpatient Prescriptions Prior to Visit  Medication Sig Dispense Refill  . ARIPiprazole (ABILIFY) 5 MG tablet Take 2.5 mg by mouth daily.      Marland Kitchen aspirin EC 81 MG tablet Take 81 mg by mouth 2 (two) times daily.      . clopidogrel  (PLAVIX) 75 MG tablet Take 75 mg by mouth every evening.      Marland Kitchen co-enzyme Q-10 30 MG capsule Take 30 mg by mouth daily.      Marland Kitchen desvenlafaxine (PRISTIQ) 100 MG 24 hr tablet Take 100 mg by mouth daily.      . fluticasone (VERAMYST) 27.5 MCG/SPRAY nasal spray Place 2 sprays into both nostrils as needed for rhinitis or allergies.      Marland Kitchen HYDROcodone-acetaminophen (NORCO/VICODIN) 5-325 MG per tablet Take 5-325 tablets by mouth as needed.      . Ipratropium-Albuterol (COMBIVENT RESPIMAT) 20-100 MCG/ACT AERS respimat Inhale 2 puffs into the lungs every 6 (six) hours as needed for wheezing.      Javier Docker Oil 300 MG CAPS Take 300 mg by mouth daily.      Marland Kitchen lisinopril (PRINIVIL) 10 MG tablet Take 2 tablets (20 mg total) by mouth daily.      Marland Kitchen lisinopril-hydrochlorothiazide (PRINZIDE,ZESTORETIC) 20-25 MG per tablet Take 1 tablet by mouth daily.      . metoprolol succinate (TOPROL-XL) 25 MG 24 hr tablet Take 25 mg by mouth every evening.      . multivitamin-iron-minerals-folic acid (CENTRUM) chewable tablet Chew 1 tablet by mouth daily.      Marland Kitchen oxyCODONE-acetaminophen (PERCOCET/ROXICET) 5-325 MG per tablet Take 5-325 tablets by mouth daily as needed.      . Pitavastatin Calcium (LIVALO) 4 MG TABS Take 4 mg by mouth daily.      . potassium chloride (K-DUR) 10 MEQ tablet Take 1 tablet (10 mEq total) by mouth daily.  10 tablet  0  . Tetrahydrozoline HCl (VISINE OP) Place 1 drop into both eyes daily as needed (dry eyes).      . topiramate (TOPAMAX) 25 MG tablet 1 tablet at night for one week, then take 2 tablets at night  60 tablet  2  . traMADol (ULTRAM) 50 MG tablet       . VALIUM 10 MG tablet Take 10 mg by mouth 2 (two) times daily.      Marland Kitchen zolpidem (AMBIEN) 10 MG tablet Take 10 mg by mouth at bedtime. TAKE 1 AND 1/2  TO 2 TABLETS       No facility-administered medications prior to visit.    PAST MEDICAL HISTORY: Past Medical History  Diagnosis Date  . TIA (transient ischemic attack) 09/2011  . Hypertension    . Hyperlipidemia   . Arthritis   . Asthma     exercise induced  . Headache(784.0) 10/07/2013  . 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 Cervical spondylosis   . Lumbosacral spondylosis   . Migraine headache   . History of cerebrovascular disease   . Hx of Doppler ultrasound 09/2012    Carotid dopplers which showed no significant increase in velocities, extremely minimal on the left, antegrade vertebral bilaterally.  Marland Kitchen History of stress test 03/2008    the post stress myocardial perfusion images show a normal pattern of perfusion in all regions. The post ventricle is normal in size. There is no scintigraphic evidence of  inducible myocardial ichemia. There is prominent gut uptake activity noted in the infero-apical region.    PAST SURGICAL HISTORY: Past Surgical History  Procedure Laterality Date  . 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 Foot fracture surgery      Multiple procedures, bilateral feet  . Left a.c. joint surgery  1990s    FAMILY HISTORY: Family History  Problem Relation Age of Onset  . Stroke Father   . Heart attack Mother   . Stroke Mother     SOCIAL HISTORY: History   Social History  . Marital Status: Married    Spouse Name: [name]     Number of Children: 0  . Years of Education: COLLEGE   Occupational History  . Horse Breeder     Social History Main Topics  . Smoking status: Former Smoker    Quit date: 06/09/2010  . Smokeless tobacco: Never Used  . Alcohol Use: Yes     Comment: ocassional wine  . Drug Use: No  . Sexual Activity: Not on file   Other Topics Concern  . Not on file   Social History Narrative   Patient lives at home with her husband [name].    Patient has no children.    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    Patient has BA degree.       PHYSICAL EXAM  Filed Vitals:   06/11/14 1536  BP:  119/83  Pulse: 93  Height: 5' 4.5" (1.638 m)  Weight: 151 lb (68.493 kg)  Othrostatic blood pressure: lying: 122/90, sitting 119/90, standing 132/92 Body mass index is 25.53 kg/(m^2).  Generalized: Well developed, in no acute distress   Neurological examination  Mentation: Alert oriented to time, place, history taking. Follows all commands speech and language fluent Cranial nerve II-XII:  Extraocular movements were full, visual field were full on confrontational test. Horizontal nystagmus to the right.  Motor: The motor testing reveals 5 over 5 strength of all 4 extremities. Good symmetric motor tone is noted throughout.  Sensory: Sensory testing is intact to soft touch on all 4 extremities. No evidence of extinction is noted.  Coordination: Cerebellar testing reveals good finger-nose-finger and heel-to-shin bilaterally.  Gait and station: Gait is normal. Tandem gait is normal. Romberg is negative. No drift is seen.  Reflexes: Deep tendon reflexes are symmetric and normal bilaterally.    DIAGNOSTIC DATA (LABS, IMAGING, TESTING) - I reviewed patient records, labs, notes, testing and imaging myself where available.  Lab Results  Component Value Date   WBC 9.5 09/22/2013   HGB 15.5* 09/22/2013   HCT 44.6 09/22/2013   MCV 96.7 09/22/2013   PLT 411* 09/22/2013      Component Value Date/Time   NA 135 09/06/2013 1701   K 4.1 09/06/2013 1701   CL 100 09/06/2013 1701   CO2 26 09/06/2013 1701   GLUCOSE 83 09/06/2013 1701   BUN 14 09/06/2013 1701   CREATININE 0.70 09/06/2013 1701   CREATININE 0.74 06/11/2013 1337   CALCIUM 9.4 09/06/2013 1701   PROT 6.9 09/05/2013 0958   ALBUMIN 3.8 09/05/2013 0958   AST 29 09/05/2013 0958   ALT 19 09/05/2013 0958   ALKPHOS 66 09/05/2013 0958   BILITOT 0.6 09/05/2013 0958   GFRNONAA >90 09/06/2013 1701   GFRAA >90 09/06/2013 1701   Lab Results  Component Value Date   CHOL 213* 09/06/2013   HDL 50 09/06/2013  LDLCALC 89 09/06/2013   TRIG 368* 09/06/2013   CHOLHDL  4.3 09/06/2013   Lab Results  Component Value Date   HGBA1C 5.6 09/05/2013   Lab Results  Component Value Date   VITAMINB12 293 09/22/2013   Lab Results  Component Value Date   TSH 0.735 06/11/2013      ASSESSMENT AND PLAN 63 y.o. year old female  has a past medical history of TIA (transient ischemic attack) (09/2011); Hypertension; Hyperlipidemia; Arthritis; Asthma; Headache(784.0) (10/07/2013); Cerebral aneurysm; Cervical spondylosis; Lumbosacral spondylosis; Migraine headache; History of cerebrovascular disease; Doppler ultrasound (09/2012); and History of stress test (03/2008). here with:  1. Headache(784.0) 2. Dizziness and giddiness  Patient continues to have daily headaches. She was tried on Topamax and was unable to tolerate the medication. I will refer the patient for Botox therapy. The patient has failed two medications, she is currently on a beta blocker and that has offered no relief and she was unable to tolerate the Topamax. The patient is also complaining of dizziness today. This could be vertigo or blood pressure related. Orthostatic vital signs were normal. She normally notices the dizziness when she wakes up in the morning right after she sits up in the bed. This has only happened 3-4 times. Patient also states that her blood pressure has been running in the 150s/100s at home when she takes it. I advised her to followup with her primary care Rache Klimaszewski and cardiologist. If the dizziness becomes more frequent she should let us know. Patient should also try changing position slowly to see if that eliminates the dizziness. Today her blood pressure was in normal range. The patient should followup in 6 months or sooner if needed.   Ward Givens, MSN, NP-C 06/11/2014, 3:41 PM [facility] [address], [place], [place] [address] [phone]  Note: This document was prepared with digital dictation and possible smart phrase technology. Any  transcriptional errors that result from this process are unintentional.

## 2014-06-11 NOTE — Patient Instructions (Signed)

## 2014-06-11 NOTE — Progress Notes (Signed)
I have read the note, and I agree with the clinical assessment and plan.  WILLIS,CHARLES KEITH   

## 2014-06-12 ENCOUNTER — Telehealth: Payer: Self-pay | Admitting: Neurology

## 2014-06-12 ENCOUNTER — Telehealth: Payer: Self-pay | Admitting: *Deleted

## 2014-06-12 NOTE — Telephone Encounter (Signed)
Called patient to schedule consult with Dr. Janann Colonel for Botox therapy, left message to return the call to schedule appointment.

## 2014-06-12 NOTE — Telephone Encounter (Signed)
Patient returning Tashia's call.

## 2014-06-13 ENCOUNTER — Encounter: Payer: Self-pay | Admitting: Cardiology

## 2014-06-13 DIAGNOSIS — E785 Hyperlipidemia, unspecified: Secondary | ICD-10-CM | POA: Insufficient documentation

## 2014-06-13 DIAGNOSIS — I10 Essential (primary) hypertension: Secondary | ICD-10-CM | POA: Insufficient documentation

## 2014-06-13 DIAGNOSIS — I679 Cerebrovascular disease, unspecified: Secondary | ICD-10-CM | POA: Insufficient documentation

## 2014-06-13 NOTE — Assessment & Plan Note (Signed)
No residual effects, the most recent associate was not very long and are not sure if he were truly needed diagnosis. She is on aspirin plus Plavix for that reason. She does have drains findings on her Dopplers. 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  The report suggests it was stable. This was evaluated with an MRI/MRA of the brain.

## 2014-06-13 NOTE — Assessment & Plan Note (Signed)
She unfortunately is statin intolerant, I don't have any recent labs on her to know how aggressive we need to be. We'll go ahead and order labs the chemistry panel and LFTs.

## 2014-06-13 NOTE — Progress Notes (Addendum)
PATIENT: Brenda Carney MRN: [id number] DOB: September 21, 1951 PCP: Brenda Ser, MD  Clinic Note: Chief Complaint  Patient presents with  . 6 MONTH VISIT    NO CHEST PAIN , NO SOB , NO EDEMA  . New Evaluation    HPI: Brenda Carney is a 63 y.o. female with a PMH below who presents today for this cardiology care to return to Dr. Terance Carney. She saw him last in September of 2014. She has a history of statin intolerance with hyperlipidemia, systemic hypertension and mild cerebral vascular disease status post TIA. 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 She started seeing Dr. Rollene Carney because of his significant family history of CAD. Her father had died in his 62s and severe PAD and multiple vascular surgeries and amputation starting an early age. She does not have any known coronary history herself. She had a Myoview 2009 which was negative..  Interval History: since her last visit with Dr. Rollene Carney, she seems to be doing relatively well with no recurrent TIA or amaurosis fugax symptoms until about a week ago when she had a spell of feeling quite dizzy and woozy -- no residual effects. She has mild pain in her legs and walking but nothing that is limiting. No chest pain or shortness of breath with rest or exertion.  No PND, orthopnea or edema. No palpitations, lightheadedness, dizziness, weakness or syncope/near syncope. No TIA/amaurosis fugax symptoms. No melena, hematochezia, hematuria, or epstaxis.   Past Medical History  Diagnosis Date  . TIA (transient ischemic attack) 09/2011  . Cerebrovascular disease     Carotid dopplers which showed no significant increase in velocities, extremely minimal on the left, antegrade vertebral bilaterally.  . 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 Hypertension, benign   . Hyperlipidemia with target LDL less than 100   . Headache(784.0) 10/07/2013   . Cervical spondylosis   . Lumbosacral spondylosis   . Migraine headache   . History of stress test 03/2008    the post stress myocardial perfusion images show a normal pattern of perfusion in all regions. The post ventricle is normal in size. There is no scintigraphic evidence of inducible myocardial ichemia. There is prominent gut uptake activity noted in the infero-apical region.  . Arthritis   . Exercise-induced asthma with acute exacerbation     no recen exacerbation    Prior Cardiac Evaluation and Past Surgical History: Past Surgical History  Procedure Laterality Date  . 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 Foot fracture surgery      Multiple procedures, bilateral feet  . Left a.c. joint surgery  1990s   Echocardiogram 08/2013: Suboptimal imaging. EF roughly 55%. Grade 1 diastolic dysfunction.  Allergies  Allergen Reactions  . Lyrica [Pregabalin] Other (See Comments)    DRY MOUTH  . Penicillins Swelling    Childhood reaction- tongue swelled    Current Outpatient Prescriptions  Medication Sig Dispense Refill  . ARIPiprazole (ABILIFY) 5 MG tablet Take 2.5 mg by mouth daily.      Marland Kitchen aspirin EC 81 MG tablet Take 81 mg by mouth 2 (two) times daily.      . clopidogrel (PLAVIX) 75 MG tablet Take 75 mg by mouth every evening.      Marland Kitchen co-enzyme Q-10 30 MG capsule Take 30 mg by mouth daily.      Marland Kitchen desvenlafaxine (  PRISTIQ) 100 MG 24 hr tablet Take 100 mg by mouth daily.      . fluticasone (VERAMYST) 27.5 MCG/SPRAY nasal spray Place 2 sprays into both nostrils as needed for rhinitis or allergies.      Marland Kitchen HYDROcodone-acetaminophen (NORCO/VICODIN) 5-325 MG per tablet Take 5-325 tablets by mouth as needed.      . Ipratropium-Albuterol (COMBIVENT RESPIMAT) 20-100 MCG/ACT AERS respimat Inhale 2 puffs into the lungs every 6 (six) hours as needed for wheezing.      Brenda Carney Oil 300 MG CAPS Take 300 mg by mouth daily.      Marland Kitchen lisinopril (PRINIVIL) 10 MG  tablet Take 2 tablets (20 mg total) by mouth daily.      Marland Kitchen lisinopril-hydrochlorothiazide (PRINZIDE,ZESTORETIC) 20-25 MG per tablet Take 1 tablet by mouth daily.      . metoprolol succinate (TOPROL-XL) 25 MG 24 hr tablet Take 25 mg by mouth every evening.      . multivitamin-iron-minerals-folic acid (CENTRUM) chewable tablet Chew 1 tablet by mouth daily.      Marland Kitchen oxyCODONE-acetaminophen (PERCOCET/ROXICET) 5-325 MG per tablet Take 5-325 tablets by mouth daily as needed.      . Pitavastatin Calcium (LIVALO) 4 MG TABS Take 4 mg by mouth daily.      . potassium chloride (K-DUR) 10 MEQ tablet Take 1 tablet (10 mEq total) by mouth daily.  10 tablet  0  . Tetrahydrozoline HCl (VISINE OP) Place 1 drop into both eyes daily as needed (dry eyes).      . topiramate (TOPAMAX) 25 MG tablet 1 tablet at night for one week, then take 2 tablets at night  60 tablet  2  . traMADol (ULTRAM) 50 MG tablet       . VALIUM 10 MG tablet Take 10 mg by mouth 2 (two) times daily.      Marland Kitchen zolpidem (AMBIEN) 10 MG tablet Take 10 mg by mouth at bedtime. TAKE 1 AND 1/2  TO 2 TABLETS       No current facility-administered medications for this visit.    History   Social History Narrative   Patient lives at home with her husband Brenda Carney. Patient has no children.    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    Patient has BA degree.    Smokes 2-3 cigarettes /day -- former heavy smoker (failed "quitting") -- now says she has truly "QUIT"    family history includes Heart attack in her mother; Stroke in her father and mother.  ROS: A comprehensive Review of Systems - Negative except the one dizzy episode she had last week or so.  PHYSICAL EXAM BP 138/98  Pulse 75  Ht 5\' 5"  (1.651 m)  Wt 160 lb 12.8 oz (72.938 kg)  BMI 26.76 kg/m2 General appearance: alert, cooperative, appears stated age, no distress and well-nourished, well-groomed. Healthy-appearing. Answers questions appropriately. Neck: no adenopathy, no  carotid bruit and no JVD Lungs: clear to auscultation bilaterally, normal percussion bilaterally and nonlabored, good air movement Heart: RRR, normal S1 and S2. 1-2/6 SEM at LUSB. Otherwise no M./R./G. Nondisplaced PMI. Abdomen: soft, non-tender; bowel sounds normal; no masses,  no organomegaly Extremities: extremities normal, atraumatic, no cyanosis or edema and no edema, redness or tenderness in the calves or thighs Pulses: 2+ and symmetric Neurologic: Alert and oriented X 3, normal strength and tone. Normal symmetric reflexes. Normal coordination and gait   Adult ECG Report  Rate: 75 ;  Rhythm: normal sinus rhythm; poor  anterior R-wave progression suggestive of possible anterior MI, age undetermined   Recent Labs: none  ASSESSMENT / PLAN: 2 stable patient cardiac standpoint. Not really sure what happened the last episode feeling dizzy and 40. None since. She has a mild aortic sclerosis murmur on exam. None noted on echocardiogram, albeit a relatively poor study.  TIA (transient ischemic attack) No residual effects, the most recent associate was not very long and are not sure if he were truly needed diagnosis. She is on aspirin plus Plavix for that reason. She does have drains findings on her Dopplers. 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  The report suggests it was stable. This was evaluated with an MRI/MRA of the brain.  Cerebrovascular disease Carotid Doppler seemed to be fine, the main issue is the right ophthalmic artery aneurysm as noted below.  Hyperlipidemia with target LDL less than 100 She unfortunately is statin intolerant, I don't have any recent labs on her to know how aggressive we need to be. We'll go ahead and order labs the chemistry panel and LFTs.  Tobacco abuse She really doesn't seem to be all that anxious to think about quitting smoking the last 2-3 cigarettesthat she does smoke.  Essential  hypertension Blood pressure is borderline today. I like to see what it is and followup. Return for nurse check in 2 weeks to see her blood pressure looks like. We may need to increase her regimen.    Orders Placed This Encounter  Procedures  . Lipid panel    Order Specific Question:  Has the patient fasted?    Answer:  Yes  . Comprehensive metabolic panel    Order Specific Question:  Has the patient fasted?    Answer:  Yes  . EKG 12-Lead   Meds ordered this encounter  Medications  . zolpidem (AMBIEN) 10 MG tablet    Sig: Take 10 mg by mouth at bedtime. TAKE 1 AND 1/2  TO 2 TABLETS  . traMADol (ULTRAM) 50 MG tablet    Sig:     Followup: 6 months  DAVID W. Ellyn Hack, M.D., M.S. Interventional Cardiolgy [facility]

## 2014-06-13 NOTE — Assessment & Plan Note (Signed)
She really doesn't seem to be all that anxious to think about quitting smoking the last 2-3 cigarettesthat she does smoke.

## 2014-06-13 NOTE — Assessment & Plan Note (Signed)
Carotid Doppler seemed to be fine, the main issue is the right ophthalmic artery aneurysm as noted below.

## 2014-06-13 NOTE — Assessment & Plan Note (Signed)
Blood pressure is borderline today. I like to see what it is and followup. Return for nurse check in 2 weeks to see her blood pressure looks like. We may need to increase her regimen.

## 2014-06-15 NOTE — Telephone Encounter (Signed)
Spoke with patient on 06/12/14 patient has been scheduled with Dr. Janann Colonel on 06/24/14 at 10:30 for Botox consult

## 2014-06-22 ENCOUNTER — Encounter: Payer: Self-pay | Admitting: Podiatry

## 2014-06-22 ENCOUNTER — Ambulatory Visit (INDEPENDENT_AMBULATORY_CARE_PROVIDER_SITE_OTHER): Payer: BC Managed Care – PPO

## 2014-06-22 ENCOUNTER — Ambulatory Visit: Payer: BC Managed Care – PPO | Admitting: Podiatry

## 2014-06-22 VITALS — BP 159/113 | HR 85 | Resp 16

## 2014-06-22 DIAGNOSIS — M7750 Other enthesopathy of unspecified foot: Secondary | ICD-10-CM

## 2014-06-22 DIAGNOSIS — R52 Pain, unspecified: Secondary | ICD-10-CM

## 2014-06-22 MED ORDER — TRIAMCINOLONE ACETONIDE 10 MG/ML IJ SUSP
10.0000 mg | Freq: Once | INTRAMUSCULAR | Status: AC
  Administered 2014-06-22: 10 mg

## 2014-06-22 NOTE — Progress Notes (Signed)
Subjective:     Patient ID: Brenda Carney, female   DOB: 05/18/1951, 63 y.o.   MRN: [id number]  Foot Pain   patient states I have pain on top of my right foot for the last 4 weeks and my big toe joint was sore but it feels good now   Review of Systems  All other systems reviewed and are negative.      Objective:   Physical Exam  Nursing note and vitals reviewed. Constitutional: She is oriented to person, place, and time.  Cardiovascular: Intact distal pulses.   Musculoskeletal: Normal range of motion.  Neurological: She is oriented to person, place, and time.  Skin: Skin is warm.   neurovascular status intact with muscle strength adequate and range of motion of the subtalar and midtarsal joint within normal limits. Patient is found to have discomfort in the second and third metatarsal shaft extending in a proximal direction by 3 cm with not one spot that is specifically tender. First MPJ range of motion is excellent with 30 dorsiflexion 20 of plantarflexion     Assessment:     Probable tendinitis cannot rule out stress fracture dorsum of right foot    Plan:     H&P and x-rays performed and reviewed. Today I went ahead and did a careful tendon injection 3 mg Kenalog 5 mg I can Marcaine mixture and applied a graphite bar to the right foot to prevent movement and reappoint again in 4 weeks to rex-ray and evaluate results

## 2014-06-22 NOTE — Progress Notes (Signed)
   Subjective:    Patient ID: Brenda Carney, female    DOB: 02-May-1951, 63 y.o.   MRN: [id number]  HPI Pt states that she has had right foot pain for several weeks, the pain is on the top and bottom of her foot. She stated that she heard a popping noise and felt a snap a few days ago but has been suffering from pain prior to incident. Pain is present when ambulation and when touched. States that she has noticed swelling and mild bruising was noted.    Review of Systems  Musculoskeletal: Positive for back pain and gait problem.  All other systems reviewed and are negative.      Objective:   Physical Exam        Assessment & Plan:

## 2014-06-23 ENCOUNTER — Ambulatory Visit (INDEPENDENT_AMBULATORY_CARE_PROVIDER_SITE_OTHER): Payer: BC Managed Care – PPO | Admitting: *Deleted

## 2014-06-23 ENCOUNTER — Encounter: Payer: Self-pay | Admitting: *Deleted

## 2014-06-23 VITALS — BP 130/90 | HR 64 | Wt 159.2 lb

## 2014-06-23 DIAGNOSIS — I10 Essential (primary) hypertension: Secondary | ICD-10-CM

## 2014-06-23 LAB — COMPREHENSIVE METABOLIC PANEL
ALT: 34 U/L (ref 0–35)
AST: 30 U/L (ref 0–37)
Albumin: 4.3 g/dL (ref 3.5–5.2)
Alkaline Phosphatase: 73 U/L (ref 39–117)
BUN: 14 mg/dL (ref 6–23)
CO2: 31 mEq/L (ref 19–32)
Calcium: 9.8 mg/dL (ref 8.4–10.5)
Chloride: 99 mEq/L (ref 96–112)
Creat: 0.84 mg/dL (ref 0.50–1.10)
Glucose, Bld: 92 mg/dL (ref 70–99)
Potassium: 3.9 mEq/L (ref 3.5–5.3)
Sodium: 138 mEq/L (ref 135–145)
Total Bilirubin: 0.5 mg/dL (ref 0.2–1.2)
Total Protein: 6.3 g/dL (ref 6.0–8.3)

## 2014-06-23 LAB — LIPID PANEL
Cholesterol: 265 mg/dL — ABNORMAL HIGH (ref 0–200)
HDL: 66 mg/dL (ref >39)
LDL Cholesterol: 142 mg/dL — ABNORMAL HIGH (ref 0–99)
Total CHOL/HDL Ratio: 4 Ratio
Triglycerides: 283 mg/dL — ABNORMAL HIGH (ref <150)
VLDL: 57 mg/dL — ABNORMAL HIGH (ref 0–40)

## 2014-06-23 NOTE — Progress Notes (Signed)
Patient is here for blood pressure check by a nurse. No symptoms voiced. Vital signs taken.  First blood pressure elevated , allowed patient to sit for 5 minutes , rechecked left arm blood pressure.

## 2014-06-24 ENCOUNTER — Ambulatory Visit (INDEPENDENT_AMBULATORY_CARE_PROVIDER_SITE_OTHER): Payer: BC Managed Care – PPO | Admitting: Neurology

## 2014-06-24 ENCOUNTER — Telehealth: Payer: Self-pay | Admitting: *Deleted

## 2014-06-24 ENCOUNTER — Encounter: Payer: Self-pay | Admitting: Neurology

## 2014-06-24 VITALS — BP 142/101 | HR 80 | Ht 64.5 in | Wt 161.0 lb

## 2014-06-24 DIAGNOSIS — IMO0002 Reserved for concepts with insufficient information to code with codable children: Secondary | ICD-10-CM

## 2014-06-24 DIAGNOSIS — G43709 Chronic migraine without aura, not intractable, without status migrainosus: Secondary | ICD-10-CM

## 2014-06-24 DIAGNOSIS — E782 Mixed hyperlipidemia: Secondary | ICD-10-CM

## 2014-06-24 DIAGNOSIS — Z79899 Other long term (current) drug therapy: Secondary | ICD-10-CM

## 2014-06-24 MED ORDER — FENOFIBRATE 48 MG PO TABS: 48.0000 mg | ORAL_TABLET | Freq: Every day | ORAL | Status: DC

## 2014-06-24 NOTE — Telephone Encounter (Signed)
Message copied by Raiford Simmonds on Wed Jun 24, 2014  3:55 PM ------      Message from: Leonie Man      Created: Wed Jun 24, 2014  2:04 PM       Keep current regimen.            Leonie Man, MD            ----- Message -----         From: Raiford Simmonds, RN         Sent: 06/24/2014   8:37 AM           To: Leonie Man, MD            PATIENT CAME IN YESTERDAY 06/23/14 BLOOD PRESSURE CHECK        PLEASE REVIEW-LET ME KNOW WHAT IS NEEDED            SENT ENCOUNTER TO YOU       ------

## 2014-06-24 NOTE — Telephone Encounter (Signed)
Notified patient to continue with current medication

## 2014-06-24 NOTE — Telephone Encounter (Signed)
Labs order for 3 months cmp , lipid Will mail lab slip ,patient is aware

## 2014-06-24 NOTE — Telephone Encounter (Signed)
Message copied by Raiford Simmonds on Wed Jun 24, 2014  3:46 PM ------      Message from: Leonie Man      Created: Wed Jun 24, 2014  7:43 AM       Lipids have gotten progressively worse.      Lets try fenofibrate lower dose -- recheck in 3 months.            Leonie Man, MD       ------

## 2014-06-24 NOTE — Progress Notes (Signed)
PATIENT: Brenda Carney DOB: 23-Nov-1951  REASON FOR VISIT: follow up HISTORY FROM: patient  HISTORY OF PRESENT ILLNESS: Brenda Carney is a 63 year old female with a history of chronic migraines. She returns today for a Botox evaluation for chronic headaches.  Headaches started "years ago". Having a daily mild headache that lasts all day. Will have frequent exacerbations of severe headache > 15 times a month that can last 12 hours or more. Headache is frontal, described as a pounding/pulsating pain. Gets associated nausea, + photo and phonophobia. Currently taking Toprol XL for headaches, has been on for months with no benefit. Has tried Topamax in the past but could not tolerate. Notes having Botox in the past, received good benefit but stopped due to the cost.  NP note from prior visit 06/11/2014 She returns today for followup. The patient currently takes Topamax and states that she had to stop due to dry mouth. In the past for migraine she has tried Excedrin Migraine and is currently on a beta blocker with no relief. She has tried Botox for cosmetic reasons and notice that it was beneficial for her headaches. After receiving the Botox she was headache free for 4 months. Patient continues to have a daily headache. States that the headache is in the frontal region bilaterally. Sometimes feels nauseous but denies vomiting. Patient states that light makes it worse but denies phonophobia. Patient is having some dizziness but only when she sits up in bed in the morning, after a hour or so it will subside. She describes the sensation as the room spinning. When this happens she also feels faint. This does not occur every morning. So far it has happened approximately 3-4 times. She also states she got dizzy when kneeling down at her barn and then standing up immediately. Patient does have chronic low back pain and is on several pain medications for that. No new medical issues since the last visit.   HISTORY  10/07/2013 (CW): Brenda Carney is a 63 year old right-handed white female with a history of chronic daily headaches that she claims has been present virtually throughout her entire adult wife. The patient believes that there have been at least 40 years of daily headaches. The patient indicates that the headaches are mainly behind the eyes, and in the frontal areas with sharp pains at times. The patient has had at least 5 episodes in the past of left facial weakness, tunnel vision, and difficulty with cognitive processing lasting several hours. The patient has had several TIA workups that have been unremarkable. The patient does have a mild to moderate level of chronic white matter changes by MRI the brain. The last TIA workup occurred around 09/05/2013. The patient has chronic neck discomfort as well. The patient indicates that occasionally her hands will tingle, and she relates this to her neck issues. The patient also has chronic low back pain. The patient goes on to say that in the past, she had several injections several years ago with Botox on the face for cosmetic reasons. The patient noted that her headaches disappeared for almost 4 months. The patient currently takes Excedrin Migraine almost on a daily basis for her headaches. The patient denies any weakness of the extremities, or problems with control of the bowels or the bladder. The patient has some slight imbalance problems. The patient does have photophobia and phonophobia with headache. The patient denies any nausea or vomiting with the headache. The patient is sent to this office for an evaluation.  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  REVIEW OF SYSTEMS: Full 14 system review of systems performed and notable only for:  Constitutional: N/A  Eyes: N/A Ear/Nose/Throat: N/A  Skin: N/A  Cardiovascular: N/A  Respiratory: N/A    Gastrointestinal: N/A  Genitourinary: N/A Hematology/Lymphatic: N/A  Endocrine: N/A Musculoskeletal:N/A  Allergy/Immunology: N/A  Neurological: N/A Psychiatric: N/A Sleep: N/A   ALLERGIES: Allergies  Allergen Reactions  . Lyrica [Pregabalin] Other (See Comments)    DRY MOUTH  . Penicillins Swelling    Childhood reaction- tongue swelled    HOME MEDICATIONS: Outpatient Prescriptions Prior to Visit  Medication Sig Dispense Refill  . ARIPiprazole (ABILIFY) 5 MG tablet Take 2.5 mg by mouth daily.      Marland Kitchen aspirin EC 81 MG tablet Take 81 mg by mouth 2 (two) times daily.      . clopidogrel (PLAVIX) 75 MG tablet Take 75 mg by mouth every evening.      Marland Kitchen co-enzyme Q-10 30 MG capsule Take 30 mg by mouth daily.      Marland Kitchen desvenlafaxine (PRISTIQ) 100 MG 24 hr tablet Take 100 mg by mouth daily.      . fluticasone (VERAMYST) 27.5 MCG/SPRAY nasal spray Place 2 sprays into both nostrils as needed for rhinitis or allergies.      Marland Kitchen HYDROcodone-acetaminophen (NORCO/VICODIN) 5-325 MG per tablet Take 5-325 tablets by mouth as needed.      . Ipratropium-Albuterol (COMBIVENT RESPIMAT) 20-100 MCG/ACT AERS respimat Inhale 2 puffs into the lungs every 6 (six) hours as needed for wheezing.      Javier Docker Oil 300 MG CAPS Take 300 mg by mouth daily.      Marland Kitchen lisinopril (PRINIVIL) 10 MG tablet Take 2 tablets (20 mg total) by mouth daily.      Marland Kitchen lisinopril-hydrochlorothiazide (PRINZIDE,ZESTORETIC) 20-25 MG per tablet Take 1 tablet by mouth daily.      . metoprolol succinate (TOPROL-XL) 25 MG 24 hr tablet Take 25 mg by mouth every evening.      . multivitamin-iron-minerals-folic acid (CENTRUM) chewable tablet Chew 1 tablet by mouth daily.      Marland Kitchen oxyCODONE-acetaminophen (PERCOCET/ROXICET) 5-325 MG per tablet Take 5-325 tablets by mouth daily as needed.      . Pitavastatin Calcium (LIVALO) 4 MG TABS Take 4 mg by mouth daily.      . potassium chloride (K-DUR) 10 MEQ tablet Take 1 tablet (10 mEq total) by mouth daily.   10 tablet  0  . Tetrahydrozoline HCl (VISINE OP) Place 1 drop into both eyes daily as needed (dry eyes).      . topiramate (TOPAMAX) 25 MG tablet 1 tablet at night for one week, then take 2 tablets at night  60 tablet  2  . traMADol (ULTRAM) 50 MG tablet       . VALIUM 10 MG tablet Take 10 mg by mouth 2 (two) times daily.      Marland Kitchen zolpidem (AMBIEN) 10 MG tablet Take 10 mg by mouth at bedtime. TAKE 1 AND 1/2  TO 2 TABLETS       No facility-administered medications prior to visit.    PAST MEDICAL HISTORY: Past Medical History  Diagnosis Date  . TIA (transient ischemic attack) 09/2011  . Cerebrovascular disease     Carotid dopplers which showed no significant increase in velocities, extremely minimal on the left, antegrade vertebral bilaterally.  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 Hypertension, benign   . Hyperlipidemia with target LDL less than 100   . Headache(784.0) 10/07/2013  . Cervical spondylosis   . Lumbosacral spondylosis   . Migraine headache   . History of stress test 03/2008    the post stress myocardial perfusion images show a normal pattern of perfusion in all regions. The post ventricle is normal in size. There is no scintigraphic evidence of inducible myocardial ichemia. There is prominent gut uptake activity noted in the infero-apical region.  . Arthritis   . Exercise-induced asthma with acute exacerbation     no recen exacerbation    PAST SURGICAL HISTORY: Past Surgical History  Procedure Laterality Date  . 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 Foot fracture surgery      Multiple procedures, bilateral feet  . Left a.c. joint surgery  1990s    FAMILY HISTORY: Family History  Problem Relation Age of Onset  . Stroke Father   . Heart attack Mother   . Stroke Mother     SOCIAL HISTORY: History   Social History  . Marital Status: Married     Spouse Name: [name]     Number of Children: 0  . Years of Education: COLLEGE   Occupational History  . Horse Breeder     Social History Main Topics  . Smoking status: Former Smoker    Quit date: 06/09/2010  . Smokeless tobacco: Never Used  . Alcohol Use: Yes     Comment: ocassional wine  . Drug Use: No  . Sexual Activity: Not on file   Other Topics Concern  . Not on file   Social History Narrative   Patient lives at home with her husband [name]. Patient has no children.    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    Patient has BA degree.    Smokes 2-3 cigarettes /day -- former heavy smoker (failed "quitting") -- now says she has truly "QUIT"      PHYSICAL EXAM  Filed Vitals:   06/24/14 1018  BP: 142/101  Pulse: 80  Height: 5' 4.5" (1.638 m)  Weight: 161 lb (73.029 kg)  Othrostatic blood pressure: lying: 122/90, sitting 119/90, standing 132/92 Body mass index is 27.22 kg/(m^2).  Generalized: Well developed, in no acute distress   Neurological examination  Mentation: Alert oriented to time, place, history taking. Follows all commands speech and language fluent Cranial nerve II-XII:  Extraocular movements were full, visual field were full on confrontational test. Few beats horizontal nystagmus to the right.  Motor: The motor testing reveals 5 over 5 strength of all 4 extremities. Good symmetric motor tone is noted throughout.  Sensory: Sensory testing is intact to soft touch on all 4 extremities. No evidence of extinction is noted.  Coordination: Cerebellar testing reveals good finger-nose-finger and heel-to-shin bilaterally.  Gait and station: Gait is normal. Tandem gait is normal. Romberg is negative. No drift is seen.  Reflexes: Deep tendon reflexes are symmetric and normal bilaterally.    DIAGNOSTIC DATA (LABS, IMAGING, TESTING) - I reviewed patient records, labs, notes, testing and imaging myself where available.  Lab Results  Component  Value Date   WBC 9.5 09/22/2013   HGB 15.5* 09/22/2013   HCT 44.6 09/22/2013   MCV 96.7 09/22/2013   PLT 411* 09/22/2013  Component Value Date/Time   NA 138 06/23/2014 0935   K 3.9 06/23/2014 0935   CL 99 06/23/2014 0935   CO2 31 06/23/2014 0935   GLUCOSE 92 06/23/2014 0935   BUN 14 06/23/2014 0935   CREATININE 0.84 06/23/2014 0935   CREATININE 0.70 09/06/2013 1701   CALCIUM 9.8 06/23/2014 0935   PROT 6.3 06/23/2014 0935   ALBUMIN 4.3 06/23/2014 0935   AST 30 06/23/2014 0935   ALT 34 06/23/2014 0935   ALKPHOS 73 06/23/2014 0935   BILITOT 0.5 06/23/2014 0935   GFRNONAA >90 09/06/2013 1701   GFRAA >90 09/06/2013 1701   Lab Results  Component Value Date   CHOL 265* 06/23/2014   HDL 66 06/23/2014   LDLCALC 142* 06/23/2014   TRIG 283* 06/23/2014   CHOLHDL 4.0 06/23/2014   Lab Results  Component Value Date   HGBA1C 5.6 09/05/2013   Lab Results  Component Value Date   VITAMINB12 293 09/22/2013   Lab Results  Component Value Date   TSH 0.735 06/11/2013      ASSESSMENT AND PLAN 63 y.o. year old female  has a past medical history of TIA (transient ischemic attack) (09/2011); Cerebrovascular disease; Cerebral aneurysm; Hypertension, benign; Hyperlipidemia with target LDL less than 100; Headache(784.0) (10/07/2013); Cervical spondylosis; Lumbosacral spondylosis; Migraine headache; History of stress test (03/2008); Arthritis; and Exercise-induced asthma with acute exacerbation. here with:  1. Chronic migraine 2. Dizziness and giddiness  Patient continues to have daily headaches. She has failed treatment with 2 medications and her headaches are negatively impacting her quality of life and ability to function. She has tried Botox in the past with good benefit but stopped due to cost. She wishes to try Botox again at this. Will place request. Follow up once approval from insurance received.  Jim Like, DO  06/24/2014, 10:39 AM [facility] [address] [place],  [place] [address] [phone]  Note: This document was prepared with digital dictation and possible smart phrase technology. Any transcriptional errors that result from this process are unintentional.

## 2014-06-24 NOTE — Patient Instructions (Signed)
Overall you are doing fairly well but I do want to suggest a few things today:   Remember to drink plenty of fluid, eat healthy meals and do not skip any meals. Try to eat protein with a every meal and eat a healthy snack such as fruit or nuts in between meals. Try to keep a regular sleep-wake schedule and try to exercise daily, particularly in the form of walking, 20-30 minutes a day, if you can.   As far as your medications are concerned, I would like to suggest we try Botox therapy for migraine headaches. We will place the request with your insurance company and call you once we get approval.    Please call us with any interim questions, concerns, problems, updates or refill requests.   My clinical assistant and will answer any of your questions and relay your messages to me and also relay most of my messages to you.   Our phone number is [phone]. We also have an after hours call service for urgent matters and there is a physician on-call for urgent questions. For any emergencies you know to call 911 or go to the nearest emergency room

## 2014-06-24 NOTE — Telephone Encounter (Signed)
Message copied by Raiford Simmonds on Wed Jun 24, 2014  3:57 PM ------      Message from: Leonie Man      Created: Wed Jun 24, 2014  7:43 AM       Lipids have gotten progressively worse.      Lets try fenofibrate lower dose -- recheck in 3 months.            Leonie Man, MD       ------

## 2014-06-24 NOTE — Telephone Encounter (Signed)
Spoke to patient. Result given . Verbalized understanding E- sent prescription to [facility]

## 2014-06-25 NOTE — Progress Notes (Signed)
Blood pressure looks ok. Continue current Rx for now.  Leonie Man, MD

## 2014-07-09 ENCOUNTER — Ambulatory Visit: Payer: BC Managed Care – PPO | Admitting: Neurology

## 2014-07-20 ENCOUNTER — Encounter: Payer: Self-pay | Admitting: Podiatry

## 2014-07-20 ENCOUNTER — Ambulatory Visit (INDEPENDENT_AMBULATORY_CARE_PROVIDER_SITE_OTHER): Payer: BC Managed Care – PPO | Admitting: Podiatry

## 2014-07-20 ENCOUNTER — Ambulatory Visit (INDEPENDENT_AMBULATORY_CARE_PROVIDER_SITE_OTHER): Payer: BC Managed Care – PPO

## 2014-07-20 VITALS — BP 165/106 | HR 92 | Resp 16

## 2014-07-20 DIAGNOSIS — M7750 Other enthesopathy of unspecified foot: Secondary | ICD-10-CM

## 2014-07-20 DIAGNOSIS — M8448XA Pathological fracture, other site, initial encounter for fracture: Secondary | ICD-10-CM

## 2014-07-20 NOTE — Progress Notes (Signed)
Subjective:     Patient ID: Brenda Carney, female   DOB: 03-06-1951, 63 y.o.   MRN: [id number]  HPI patient points to the right foot stating it's been doing pretty good but still get sore at the end of the day or if she has been on it a lot   Review of Systems     Objective:   Physical Exam Neurovascular status intact with no muscle strength loss and good range of motion. Dorsum of the right foot around the second metatarsal shaft continues to have mild edema with discomfort when pressed    Assessment:     Tendinitis versus stress fracture dorsum right foot    Plan:     Reviewed x-rays and discussed that there is a probable very subtle stress fracture in this area and that like for her to continue with ice therapy supportive shoe gear usage and it should heal uneventfully in the next 4-6 weeks

## 2014-07-22 ENCOUNTER — Encounter: Payer: Self-pay | Admitting: Neurology

## 2014-07-22 ENCOUNTER — Ambulatory Visit (INDEPENDENT_AMBULATORY_CARE_PROVIDER_SITE_OTHER): Payer: BC Managed Care – PPO | Admitting: Neurology

## 2014-07-22 VITALS — BP 134/84 | HR 116 | Ht 64.5 in | Wt 160.0 lb

## 2014-07-22 DIAGNOSIS — G43909 Migraine, unspecified, not intractable, without status migrainosus: Secondary | ICD-10-CM

## 2014-07-22 DIAGNOSIS — G43719 Chronic migraine without aura, intractable, without status migrainosus: Secondary | ICD-10-CM

## 2014-07-22 MED ORDER — ONABOTULINUMTOXINA 100 UNITS IJ SOLR: 200.0000 [IU] | Freq: Once | INTRAMUSCULAR | Status: DC

## 2014-07-22 NOTE — Progress Notes (Signed)
[facility]   Provider:  Dr Janann Colonel Referring Provider: Myrtis Ser, MD Primary Care Physician:  Myrtis Ser, MD  CC:  Migraine headaches  HPI:  Brenda Carney is a 63 y.o. female here for initial Botox injections for migraine headache. Reports daily severe headaches which are negatively impacting her quality of life.    Contraindications and precautions discussed with patient. Aseptic procedure was observed and patient tolerated procedure. Procedure performed by Dr. Jim Like.  The condition has existed for more than 6 months, and pt does not have a diagnosis of ALS, Myasthenia Gravis or Lambert-Eaton Syndrome.  Risks and benefits of injections discussed and pt agrees to proceed with the procedure.  Written consent obtained These injections are medically necessary. He receives good benefits from these injections. These injections do not cause sedations or hallucinations which the oral therapies may cause.  Indication/Diagnosis: Chronic migraine  Type of toxin: Botox  Lot # [id number]  Expiration date: [name] 2018  Injection sites:  Muscle Site    L   R  Corrugator    5   5  Procerus        5  Frontalis    5x2   5x2   Temporalis    5x4   5x4    Occipitalis    5x3   5x3  Trapezius    5x3   5x3  Cervical paraspinals   5x2   5x2  History   Social History  . Marital Status: Married    Spouse Name: Brenda Carney     Number of Children: 0  . Years of Education: COLLEGE   Occupational History  . Horse Breeder     Social History Main Topics  . Smoking status: Former Smoker    Quit date: 06/09/2010  . Smokeless tobacco: Never Used  . Alcohol Use: Yes     Comment: ocassional wine  . Drug Use: No  . Sexual Activity: Not on file   Other Topics Concern  . Not on file   Social History Narrative   Patient lives at home with her husband Brenda Carney. Patient has no children.    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    Patient has  BA degree.    Smokes 2-3 cigarettes /day -- former heavy smoker (failed "quitting") -- now says she has truly "QUIT"    Family History  Problem Relation Age of Onset  . Stroke Father   . Heart attack Mother   . Stroke Mother     Past Medical History  Diagnosis Date  . TIA (transient ischemic attack) 09/2011  . Cerebrovascular disease     Carotid dopplers which showed no significant increase in velocities, extremely minimal on the left, antegrade vertebral bilaterally.  . 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 Hypertension, benign   . Hyperlipidemia with target LDL less than 100   . Headache(784.0) 10/07/2013  . Cervical spondylosis   . Lumbosacral spondylosis   . Migraine headache   . History of stress test 03/2008    the post stress myocardial perfusion images show a normal pattern of perfusion in all regions. The post ventricle is normal in size. There is no scintigraphic evidence of inducible myocardial ichemia. There is prominent gut uptake activity noted in the infero-apical region.  . Arthritis   . Exercise-induced asthma with acute exacerbation     no recen exacerbation  Past Surgical History  Procedure Laterality Date  . 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 Foot fracture surgery      Multiple procedures, bilateral feet  . Left a.c. joint surgery  1990s    Current Outpatient Prescriptions  Medication Sig Dispense Refill  . ARIPiprazole (ABILIFY) 5 MG tablet Take 2.5 mg by mouth daily.      Marland Kitchen aspirin EC 81 MG tablet Take 81 mg by mouth 2 (two) times daily.      Marland Kitchen co-enzyme Q-10 30 MG capsule Take 30 mg by mouth daily.      Marland Kitchen desvenlafaxine (PRISTIQ) 100 MG 24 hr tablet Take 100 mg by mouth daily.      . fenofibrate (TRICOR) 48 MG tablet Take 1 tablet (48 mg total) by mouth daily.  30 tablet  11  . fluticasone (VERAMYST) 27.5 MCG/SPRAY nasal spray Place 2  sprays into both nostrils as needed for rhinitis or allergies.      Marland Kitchen HYDROcodone-acetaminophen (NORCO/VICODIN) 5-325 MG per tablet Take 5-325 tablets by mouth as needed.      . Ipratropium-Albuterol (COMBIVENT RESPIMAT) 20-100 MCG/ACT AERS respimat Inhale 2 puffs into the lungs every 6 (six) hours as needed for wheezing.      Javier Docker Oil 300 MG CAPS Take 300 mg by mouth daily.      Marland Kitchen lisinopril (PRINIVIL) 10 MG tablet Take 2 tablets (20 mg total) by mouth daily.      Marland Kitchen lisinopril-hydrochlorothiazide (PRINZIDE,ZESTORETIC) 20-25 MG per tablet Take 1 tablet by mouth daily.      . metoprolol succinate (TOPROL-XL) 25 MG 24 hr tablet Take 25 mg by mouth every evening.      . multivitamin-iron-minerals-folic acid (CENTRUM) chewable tablet Chew 1 tablet by mouth daily.      Marland Kitchen oxyCODONE-acetaminophen (PERCOCET/ROXICET) 5-325 MG per tablet Take 5-325 tablets by mouth daily as needed.      . Pitavastatin Calcium (LIVALO) 4 MG TABS Take 4 mg by mouth daily.      . potassium chloride (K-DUR) 10 MEQ tablet Take 1 tablet (10 mEq total) by mouth daily.  10 tablet  0  . Tetrahydrozoline HCl (VISINE OP) Place 1 drop into both eyes daily as needed (dry eyes).      . topiramate (TOPAMAX) 25 MG tablet 1 tablet at night for one week, then take 2 tablets at night  60 tablet  2  . traMADol (ULTRAM) 50 MG tablet       . VALIUM 10 MG tablet Take 10 mg by mouth 2 (two) times daily.      Marland Kitchen zolpidem (AMBIEN) 10 MG tablet Take 10 mg by mouth at bedtime. TAKE 1 AND 1/2  TO 2 TABLETS       No current facility-administered medications for this visit.    Allergies as of 07/22/2014 - Review Complete 07/22/2014  Allergen Reaction Noted  . Lyrica [pregabalin] Other (See Comments) 06/09/2014  . Penicillins Swelling 09/05/2013    Vitals: BP 134/84  Pulse 116  Ht 5' 4.5" (1.638 m)  Wt 160 lb (72.576 kg)  BMI 27.05 kg/m2 Last Weight:  Wt Readings from Last 1 Encounters:  07/22/14 160 lb (72.576 kg)   Last Height:   Ht  Readings from Last 1 Encounters:  07/22/14 5' 4.5" (1.638 m)      Assessment:  After physical and neurologic examination, review of laboratory studies, imaging, neurophysiology testing and pre-existing records, assessment will be reviewed  on the problem list.  Plan:  Treatment plan and additional workup will be reviewed under Problem List.  Brenda Carney is a 63 y.o. female here for botox injections for chronic migraine headache  1) Botox injections as detailed above. A total of 155 units was used. 45 units wasted 2) Tylenol or Motrin for injection site pain. 3) Medication guide dispensed. 4) Follow up for repeat injections in 3 months    Jim Like, DO  [facility] [address] [place], [place] [address]  Phone [phone] Fax [phone]

## 2014-07-30 NOTE — Telephone Encounter (Signed)
Noted  

## 2014-07-31 ENCOUNTER — Other Ambulatory Visit (HOSPITAL_COMMUNITY)
Admission: RE | Admit: 2014-07-31 | Discharge: 2014-07-31 | Disposition: A | Discharge status: Home / Self Care | Payer: BC Managed Care – PPO | Source: Ambulatory Visit | Attending: Internal Medicine | Admitting: Internal Medicine

## 2014-07-31 ENCOUNTER — Other Ambulatory Visit: Payer: Self-pay | Admitting: Internal Medicine

## 2014-07-31 DIAGNOSIS — Z1151 Encounter for screening for human papillomavirus (HPV): Secondary | ICD-10-CM | POA: Insufficient documentation

## 2014-07-31 DIAGNOSIS — Z01419 Encounter for gynecological examination (general) (routine) without abnormal findings: Secondary | ICD-10-CM | POA: Insufficient documentation

## 2014-08-05 LAB — CYTOLOGY - PAP

## 2014-09-03 ENCOUNTER — Other Ambulatory Visit: Payer: Self-pay | Admitting: Neurological Surgery

## 2014-09-09 ENCOUNTER — Telehealth: Payer: Self-pay | Admitting: *Deleted

## 2014-09-09 DIAGNOSIS — E782 Mixed hyperlipidemia: Secondary | ICD-10-CM

## 2014-09-09 DIAGNOSIS — Z79899 Other long term (current) drug therapy: Secondary | ICD-10-CM

## 2014-09-09 NOTE — Telephone Encounter (Signed)
Mailed letter and labslip 

## 2014-09-09 NOTE — Telephone Encounter (Signed)
Message copied by Raiford Simmonds on Wed Sep 09, 2014 12:35 PM ------      Message from: [place], [place]: Wed Jun 24, 2014  4:01 PM       Mail lab slip in AUG 2015      DUE IN SEPT 2015 ------

## 2014-09-09 NOTE — Telephone Encounter (Signed)
Message copied by Raiford Simmonds on Wed Sep 09, 2014 12:19 PM ------      Message from: [place], [place]: Wed Jun 24, 2014  4:01 PM       Mail lab slip in AUG 2015      DUE IN SEPT 2015 ------

## 2014-09-16 ENCOUNTER — Encounter (HOSPITAL_COMMUNITY): Payer: Self-pay | Admitting: Pharmacy Technician

## 2014-09-17 ENCOUNTER — Encounter (HOSPITAL_COMMUNITY): Payer: Self-pay

## 2014-09-17 ENCOUNTER — Encounter (HOSPITAL_COMMUNITY)
Admission: RE | Admit: 2014-09-17 | Discharge: 2014-09-17 | Disposition: A | Discharge status: Home / Self Care | Payer: BC Managed Care – PPO | Source: Ambulatory Visit | Attending: Neurological Surgery | Admitting: Neurological Surgery

## 2014-09-17 DIAGNOSIS — Q762 Congenital spondylolisthesis: Secondary | ICD-10-CM | POA: Diagnosis not present

## 2014-09-17 DIAGNOSIS — Z01810 Encounter for preprocedural cardiovascular examination: Secondary | ICD-10-CM | POA: Diagnosis present

## 2014-09-17 DIAGNOSIS — Z01812 Encounter for preprocedural laboratory examination: Secondary | ICD-10-CM | POA: Diagnosis present

## 2014-09-17 HISTORY — DX: Gastro-esophageal reflux disease without esophagitis: K21.9

## 2014-09-17 HISTORY — DX: Personal history of traumatic brain injury: Z87.820

## 2014-09-17 HISTORY — DX: Personal history of other diseases of the digestive system: Z87.19

## 2014-09-17 HISTORY — DX: Sleep apnea, unspecified: G47.30

## 2014-09-17 HISTORY — DX: Pneumonia, unspecified organism: J18.9

## 2014-09-17 LAB — BASIC METABOLIC PANEL
Anion gap: 17 — ABNORMAL HIGH (ref 5–15)
BUN: 10 mg/dL (ref 6–23)
CO2: 25 mEq/L (ref 19–32)
Calcium: 9.9 mg/dL (ref 8.4–10.5)
Chloride: 99 mEq/L (ref 96–112)
Creatinine, Ser: 0.7 mg/dL (ref 0.50–1.10)
GFR calc Af Amer: >90 mL/min (ref >90)
GFR calc non Af Amer: >90 mL/min (ref >90)
Glucose, Bld: 94 mg/dL (ref 70–99)
Potassium: 3.8 mEq/L (ref 3.7–5.3)
Sodium: 141 mEq/L (ref 137–147)

## 2014-09-17 LAB — ABO/RH: ABO/RH(D): A POS

## 2014-09-17 LAB — TYPE AND SCREEN
ABO/RH(D): A POS
Antibody Screen: NEGATIVE

## 2014-09-17 LAB — CBC WITH DIFFERENTIAL/PLATELET
Basophils Absolute: 0.1 10*3/uL (ref 0.0–0.1)
Basophils Relative: 1 % (ref 0–1)
Eosinophils Absolute: 0.2 10*3/uL (ref 0.0–0.7)
Eosinophils Relative: 3 % (ref 0–5)
HCT: 45.2 % (ref 36.0–46.0)
Hemoglobin: 16 g/dL — ABNORMAL HIGH (ref 12.0–15.0)
Lymphocytes Relative: 24 % (ref 12–46)
Lymphs Abs: 2.3 10*3/uL (ref 0.7–4.0)
MCH: 35.6 pg — ABNORMAL HIGH (ref 26.0–34.0)
MCHC: 35.4 g/dL (ref 30.0–36.0)
MCV: 100.7 fL — ABNORMAL HIGH (ref 78.0–100.0)
Monocytes Absolute: 0.6 10*3/uL (ref 0.1–1.0)
Monocytes Relative: 7 % (ref 3–12)
Neutro Abs: 6.2 10*3/uL (ref 1.7–7.7)
Neutrophils Relative %: 65 % (ref 43–77)
Platelets: 359 10*3/uL (ref 150–400)
RBC: 4.49 MIL/uL (ref 3.87–5.11)
RDW: 12.9 % (ref 11.5–15.5)
WBC: 9.4 10*3/uL (ref 4.0–10.5)

## 2014-09-17 LAB — PROTIME-INR
INR: 0.88 (ref 0.00–1.49)
Prothrombin Time: 11.9 seconds (ref 11.6–15.2)

## 2014-09-17 LAB — SURGICAL PCR SCREEN
MRSA, PCR: NEGATIVE
Staphylococcus aureus: POSITIVE — AB

## 2014-09-17 NOTE — Pre-Procedure Instructions (Signed)
STATIA BURDICK  09/17/2014   Your procedure is scheduled on:  Wednesday, September 30, 2014  Report to [facility] Admitting at 9:30 AM.  Call this number if you have problems the morning of surgery: [phone]   Remember:   Do not eat food or drink liquids after midnight Tuesday, September 29, 2014   Take these medicines the morning of surgery with A SIP OF WATER: ARIPiprazole (ABILIFY), desvenlafaxine (PRISTIQ), Tapentadol HCl (NUCYNTA ER), if needed: diazepam (VALIUM) for anxiety fluticasone (VERAMYST) 27.5 MCG/SPRAY nasal spray for allergies,Tetrahydrozoline HCl (VISINE) for dry eyes. Ipratropium-Albuterol (COMBIVENT RESPIMAT) 20-100 MCG/ACT AERS respimat for wheezing ( bring inhaler with you on day of procedure).  Stop taking Aspirin, vitamins, and herbal medications (co-enzyme Q-10, Krill Oil) Do not take any NSAIDs ie: Ibuprofen, Advil, Naproxen or any medication containing Aspirin; stop Wednesday, September 23, 2014).  Do not wear jewelry, make-up or nail polish.  Do not wear lotions, powders, or perfumes. You may not wear deodorant.  Do not shave 48 hours prior to surgery.   Do not bring valuables to the hospital.  [facility] is not responsible  for any belongings or valuables.               Contacts, dentures or bridgework may not be worn into surgery.  Leave suitcase in the car. After surgery it may be brought to your room.  For patients admitted to the hospital, discharge time is determined by your treatment team.               Patients discharged the day of surgery will not be allowed to drive home.  Name and phone number of your driver:   Special Instructions: Special Instructions:Special Instructions: [facility] - Preparing for Surgery  Before surgery, you can play an important role.  Because skin is not sterile, your skin needs to be as free of germs as possible.  You can reduce the number of germs on you skin by washing with CHG (chlorahexidine gluconate) soap  before surgery.  CHG is an antiseptic cleaner which kills germs and bonds with the skin to continue killing germs even after washing.  Please DO NOT use if you have an allergy to CHG or antibacterial soaps.  If your skin becomes reddened/irritated stop using the CHG and inform your nurse when you arrive at Short Stay.  Do not shave (including legs and underarms) for at least 48 hours prior to the first CHG shower.  You may shave your face.  Please follow these instructions carefully:   1.  Shower with CHG Soap the night before surgery and the morning of Surgery.  2.  If you choose to wash your hair, wash your hair first as usual with your normal shampoo.  3.  After you shampoo, rinse your hair and body thoroughly to remove the Shampoo.  4.  Use CHG as you would any other liquid soap.  You can apply chg directly  to the skin and wash gently with scrungie or a clean washcloth.  5.  Apply the CHG Soap to your body ONLY FROM THE NECK DOWN.  Do not use on open wounds or open sores.  Avoid contact with your eyes, ears, mouth and genitals (private parts).  Wash genitals (private parts) with your normal soap.  6.  Wash thoroughly, paying special attention to the area where your surgery will be performed.  7.  Thoroughly rinse your body with warm water from the neck down.  8.  DO NOT shower/wash with your normal soap after using and rinsing off the CHG Soap.  9.  Pat yourself dry with a clean towel.            10.  Wear clean pajamas.            11.  Place clean sheets on your bed the night of your first shower and do not sleep with pets.  Day of Surgery  Do not apply any lotions/deodorants the morning of surgery.  Please wear clean clothes to the hospital/surgery center.   Please read over the following fact sheets that you were given: Pain Booklet, Coughing and Deep Breathing, Blood Transfusion Information, MRSA Information and Surgical Site Infection Prevention

## 2014-09-18 NOTE — Progress Notes (Signed)
Anesthesia Chart Review:  Patient is a 63 year old female scheduled for L4-5 PLIF on 09/30/14 by Dr. Ronnald Ramp.  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  PCP is listed as Dr. Myrtis Ser. Neurologist is Dr. Jim Like. Cardiologist is Dr. Glenetta Hew (previously Dr. Rollene Fare), last visit 06/13/14. She does not carry a known CAD diagnosis, but is primarily followed there for HTN, HLD, and cerebrovascular disease.  Meds: Abilify, Combivent, Krill Oil, Pitavastatin calcium, Nucynta ER, Visine, Botox IM, co-enzyme, Pristiq, Valium, Tricor, fluticasone, lisinopril, lisinopril-HCTZ, Toprol, Centrum, Ambien. (Was on Plavix for TIA history according to 06/13/14 cardiology records, but not currently listed in her medication list.)  BP at PAT was 159/102 and 153/104. (Her last few BP readings in Epic are 138/98 06/09/14, 130/90 on 06/23/14, 142/101 06/24/14, 165/106 on 07/20/14.)   EKG on 06/09/14 showed: NSR, septal infarct (age undetermined). Septal infarct was also noted on prior EKG from 09/05/13.  Echo on 09/06/13 showed:  - Procedure narrative: Transthoracic echocardiography. Technically difficult study with suboptimal image quality. - Left ventricle: The cavity size was normal. Wall thickness was normal. Systolic function was normal. The estimated ejection fraction was in the range of 55% to 60%. Doppler parameters are consistent with abnormal left ventricular relaxation (grade 1 diastolic dysfunction). The E/e' ratio is ~10, suggesting borderline increased LV filling pressure. - Mitral valve: Calcified annulus. Mildly thickened leaflets. Trivial regurgitation. - Left atrium: The atrium was mildly dilated. Volume 32 ml/m2. -  Atrial septum: Appears thickened. No defect or patent foramen ovale was identified by color doppler. - Inferior vena cava: The vessel was normal in size; the respirophasic diameter changes were in the normal range (= 50%); findings are consistent with normal central venous pressure.  Nuclear stress test on 04/15/08 showed: Normal pattern of perfusion in all regions. No scintigraphic evidence of inducible myocardial ischemia. There is prominent gut uptake activity noted in the inferior apical region. Poststress EF 74%. No significant wall motion abnormalities. Low risk scan.   Carotid duplex on 09/06/13 showed: Right: mild calcfic plaque with acoustic shadowing origin ICA. Moderate calcific plaque ECA. Left: mild mixed plaque origin ICA. Bilateral: 1-39% ICA stenosis. Vertebral artery flow is antegrade.  MRA of the head on 09/06/13 showed:  1. 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  2. Mild atherosclerotic irregularity within the cavernous segments is also stable.  3. 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 MRI of the head on 09/05/13 showed: No acute or subacute to infarction. 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  Preoperative CXR and labs noted.   Reviewed above with anesthesiologist Dr. Linna Caprice. Blood pressure is not optimally controlled, but no medication adjustments made at her cardiology visit in June 2015. Recommend at least touching base with her PCP or cardiologist to report elevated BP readings to inquire about any additional preoperative recommendations and/or medication adjustments.  If her BP is reasonable on the day of surgery and otherwise no acute changes then would anticipate that she could proceed as planned.  I have notified Lorriane Shire at Dr. Ronnald Ramp' office, and she will follow-up with patient's PCP and/or cardiologist.  Myra Gianotti,  PA-C [facility] Stay [facility]/Anesthesiology Phone [phone] 09/18/2014 4:49  PM

## 2014-09-29 MED ORDER — VANCOMYCIN HCL IN DEXTROSE 1-5 GM/200ML-% IV SOLN
1000.0000 mg | INTRAVENOUS | Status: AC
  Administered 2014-09-30: 1000 mg via INTRAVENOUS
  Filled 2014-09-29: qty 200

## 2014-09-29 MED ORDER — DEXAMETHASONE SODIUM PHOSPHATE 10 MG/ML IJ SOLN
10.0000 mg | INTRAMUSCULAR | Status: AC
  Administered 2014-09-30: 10 mg via INTRAVENOUS
  Filled 2014-09-29: qty 1

## 2014-09-29 NOTE — Progress Notes (Addendum)
Pt. Notified of time change for surgery,to arrive @ 6:30 AM. Pt. Voices understanding. surg time changed again. called patient to arrive at 730

## 2014-09-30 ENCOUNTER — Inpatient Hospital Stay (HOSPITAL_COMMUNITY): Payer: BC Managed Care – PPO

## 2014-09-30 ENCOUNTER — Encounter (HOSPITAL_COMMUNITY): Payer: BC Managed Care – PPO | Admitting: Vascular Surgery

## 2014-09-30 ENCOUNTER — Inpatient Hospital Stay (HOSPITAL_COMMUNITY): Payer: BC Managed Care – PPO | Admitting: Anesthesiology

## 2014-09-30 ENCOUNTER — Encounter (HOSPITAL_COMMUNITY): Payer: Self-pay | Admitting: Certified Registered"

## 2014-09-30 ENCOUNTER — Encounter (HOSPITAL_COMMUNITY)
Admission: RE | Disposition: A | Discharge status: Home / Self Care | Payer: Self-pay | Source: Ambulatory Visit | Attending: Neurological Surgery

## 2014-09-30 ENCOUNTER — Observation Stay (HOSPITAL_COMMUNITY)
Admission: RE | Admit: 2014-09-30 | Discharge: 2014-10-01 | Disposition: A | Discharge status: Home / Self Care | Payer: BC Managed Care – PPO | Source: Ambulatory Visit | Attending: Neurological Surgery | Admitting: Neurological Surgery

## 2014-09-30 DIAGNOSIS — Z720 Tobacco use: Secondary | ICD-10-CM | POA: Insufficient documentation

## 2014-09-30 DIAGNOSIS — J45990 Exercise induced bronchospasm: Secondary | ICD-10-CM | POA: Diagnosis not present

## 2014-09-30 DIAGNOSIS — I10 Essential (primary) hypertension: Secondary | ICD-10-CM | POA: Insufficient documentation

## 2014-09-30 DIAGNOSIS — Z7982 Long term (current) use of aspirin: Secondary | ICD-10-CM | POA: Insufficient documentation

## 2014-09-30 DIAGNOSIS — E785 Hyperlipidemia, unspecified: Secondary | ICD-10-CM | POA: Diagnosis not present

## 2014-09-30 DIAGNOSIS — M4806 Spinal stenosis, lumbar region: Secondary | ICD-10-CM | POA: Diagnosis not present

## 2014-09-30 DIAGNOSIS — K219 Gastro-esophageal reflux disease without esophagitis: Secondary | ICD-10-CM | POA: Diagnosis not present

## 2014-09-30 DIAGNOSIS — G4730 Sleep apnea, unspecified: Secondary | ICD-10-CM | POA: Diagnosis not present

## 2014-09-30 DIAGNOSIS — Z79899 Other long term (current) drug therapy: Secondary | ICD-10-CM | POA: Diagnosis not present

## 2014-09-30 DIAGNOSIS — Z981 Arthrodesis status: Secondary | ICD-10-CM

## 2014-09-30 DIAGNOSIS — M4316 Spondylolisthesis, lumbar region: Principal | ICD-10-CM | POA: Insufficient documentation

## 2014-09-30 DIAGNOSIS — M4310 Spondylolisthesis, site unspecified: Secondary | ICD-10-CM

## 2014-09-30 HISTORY — PX: MAXIMUM ACCESS (MAS)POSTERIOR LUMBAR INTERBODY FUSION (PLIF) 1 LEVEL: SHX6368

## 2014-09-30 SURGERY — FOR MAXIMUM ACCESS (MAS) POSTERIOR LUMBAR INTERBODY FUSION (PLIF) 1 LEVEL
Anesthesia: General | Site: Back

## 2014-09-30 MED ORDER — 0.9 % SODIUM CHLORIDE (POUR BTL) OPTIME
TOPICAL | Status: DC | PRN
  Administered 2014-09-30: 1000 mL

## 2014-09-30 MED ORDER — PROPOFOL 10 MG/ML IV BOLUS
INTRAVENOUS | Status: AC
  Filled 2014-09-30: qty 20

## 2014-09-30 MED ORDER — DEXAMETHASONE 4 MG PO TABS
4.0000 mg | ORAL_TABLET | Freq: Four times a day (QID) | ORAL | Status: DC
  Administered 2014-09-30 – 2014-10-01 (×4): 4 mg via ORAL
  Filled 2014-09-30 (×5): qty 1

## 2014-09-30 MED ORDER — SODIUM CHLORIDE 0.9 % IJ SOLN
INTRAMUSCULAR | Status: AC
  Filled 2014-09-30: qty 10

## 2014-09-30 MED ORDER — LIDOCAINE HCL (CARDIAC) 20 MG/ML IV SOLN
INTRAVENOUS | Status: DC | PRN
  Administered 2014-09-30: 100 mg via INTRAVENOUS

## 2014-09-30 MED ORDER — ARTIFICIAL TEARS OP OINT
TOPICAL_OINTMENT | OPHTHALMIC | Status: AC
  Filled 2014-09-30: qty 3.5

## 2014-09-30 MED ORDER — BUPIVACAINE HCL (PF) 0.25 % IJ SOLN
INTRAMUSCULAR | Status: DC | PRN
  Administered 2014-09-30: 3 mL

## 2014-09-30 MED ORDER — THROMBIN 20000 UNITS EX SOLR
CUTANEOUS | Status: DC | PRN
  Administered 2014-09-30: 10:00:00 via TOPICAL

## 2014-09-30 MED ORDER — SODIUM CHLORIDE 0.9 % IV SOLN
10.0000 mg | INTRAVENOUS | Status: DC | PRN
  Administered 2014-09-30: 20 ug/min via INTRAVENOUS

## 2014-09-30 MED ORDER — SODIUM CHLORIDE 0.9 % IJ SOLN
3.0000 mL | Freq: Two times a day (BID) | INTRAMUSCULAR | Status: DC
  Administered 2014-09-30: 3 mL via INTRAVENOUS

## 2014-09-30 MED ORDER — METOPROLOL SUCCINATE ER 25 MG PO TB24
25.0000 mg | ORAL_TABLET | Freq: Every evening | ORAL | Status: DC
  Filled 2014-09-30: qty 1

## 2014-09-30 MED ORDER — ACETAMINOPHEN 650 MG RE SUPP
650.0000 mg | RECTAL | Status: DC | PRN
Start: 2014-09-30 — End: 2014-10-01

## 2014-09-30 MED ORDER — FENTANYL CITRATE 0.05 MG/ML IJ SOLN
INTRAMUSCULAR | Status: DC | PRN
  Administered 2014-09-30: 100 ug via INTRAVENOUS
  Administered 2014-09-30: 150 ug via INTRAVENOUS
  Administered 2014-09-30 (×5): 50 ug via INTRAVENOUS

## 2014-09-30 MED ORDER — SCOPOLAMINE 1 MG/3DAYS TD PT72
MEDICATED_PATCH | TRANSDERMAL | Status: AC
  Filled 2014-09-30: qty 1

## 2014-09-30 MED ORDER — HYDROMORPHONE HCL 1 MG/ML IJ SOLN
INTRAMUSCULAR | Status: AC
  Filled 2014-09-30: qty 1

## 2014-09-30 MED ORDER — EPHEDRINE SULFATE 50 MG/ML IJ SOLN
INTRAMUSCULAR | Status: AC
  Filled 2014-09-30: qty 1

## 2014-09-30 MED ORDER — VANCOMYCIN HCL IN DEXTROSE 1-5 GM/200ML-% IV SOLN
1000.0000 mg | Freq: Once | INTRAVENOUS | Status: AC
  Administered 2014-09-30: 1000 mg via INTRAVENOUS
  Filled 2014-09-30: qty 200

## 2014-09-30 MED ORDER — LIDOCAINE HCL (CARDIAC) 20 MG/ML IV SOLN
INTRAVENOUS | Status: AC
  Filled 2014-09-30: qty 5

## 2014-09-30 MED ORDER — ACETAMINOPHEN 325 MG PO TABS: 650.0000 mg | ORAL_TABLET | ORAL | Status: DC | PRN

## 2014-09-30 MED ORDER — SCOPOLAMINE 1 MG/3DAYS TD PT72
MEDICATED_PATCH | TRANSDERMAL | Status: AC
  Administered 2014-09-30: 1 via TRANSDERMAL
  Filled 2014-09-30: qty 1

## 2014-09-30 MED ORDER — IPRATROPIUM-ALBUTEROL 0.5-2.5 (3) MG/3ML IN SOLN: 3.0000 mL | Freq: Four times a day (QID) | RESPIRATORY_TRACT | Status: DC | PRN

## 2014-09-30 MED ORDER — ZOLPIDEM TARTRATE 5 MG PO TABS: 5.0000 mg | ORAL_TABLET | Freq: Every evening | ORAL | Status: DC | PRN

## 2014-09-30 MED ORDER — MIDAZOLAM HCL 5 MG/5ML IJ SOLN
INTRAMUSCULAR | Status: DC | PRN
  Administered 2014-09-30: 2 mg via INTRAVENOUS

## 2014-09-30 MED ORDER — OXYCODONE HCL 5 MG PO TABS
ORAL_TABLET | ORAL | Status: AC
  Filled 2014-09-30: qty 1

## 2014-09-30 MED ORDER — LISINOPRIL-HYDROCHLOROTHIAZIDE 20-25 MG PO TABS: 1.0000 | ORAL_TABLET | Freq: Every day | ORAL | Status: DC

## 2014-09-30 MED ORDER — VENLAFAXINE HCL ER 37.5 MG PO CP24
37.5000 mg | ORAL_CAPSULE | Freq: Every day | ORAL | Status: DC
  Administered 2014-10-01: 37.5 mg via ORAL
  Filled 2014-09-30 (×2): qty 1

## 2014-09-30 MED ORDER — PHENYLEPHRINE 40 MCG/ML (10ML) SYRINGE FOR IV PUSH (FOR BLOOD PRESSURE SUPPORT)
PREFILLED_SYRINGE | INTRAVENOUS | Status: AC
  Filled 2014-09-30: qty 10

## 2014-09-30 MED ORDER — PHENOL 1.4 % MT LIQD: 1.0000 | OROMUCOSAL | Status: DC | PRN

## 2014-09-30 MED ORDER — IPRATROPIUM-ALBUTEROL 20-100 MCG/ACT IN AERS: 2.0000 | INHALATION_SPRAY | Freq: Four times a day (QID) | RESPIRATORY_TRACT | Status: DC | PRN

## 2014-09-30 MED ORDER — ONDANSETRON HCL 4 MG/2ML IJ SOLN: 4.0000 mg | INTRAMUSCULAR | Status: DC | PRN

## 2014-09-30 MED ORDER — METHOCARBAMOL 500 MG PO TABS
500.0000 mg | ORAL_TABLET | Freq: Four times a day (QID) | ORAL | Status: DC | PRN
  Administered 2014-09-30 – 2014-10-01 (×3): 500 mg via ORAL
  Filled 2014-09-30 (×3): qty 1

## 2014-09-30 MED ORDER — FLUTICASONE PROPIONATE 50 MCG/ACT NA SUSP
2.0000 | Freq: Every day | NASAL | Status: DC
  Filled 2014-09-30: qty 16

## 2014-09-30 MED ORDER — PHENYLEPHRINE HCL 10 MG/ML IJ SOLN
INTRAMUSCULAR | Status: AC
  Filled 2014-09-30: qty 1

## 2014-09-30 MED ORDER — ONDANSETRON HCL 4 MG/2ML IJ SOLN
INTRAMUSCULAR | Status: AC
  Filled 2014-09-30: qty 2

## 2014-09-30 MED ORDER — FENTANYL CITRATE 0.05 MG/ML IJ SOLN
INTRAMUSCULAR | Status: AC
  Filled 2014-09-30: qty 5

## 2014-09-30 MED ORDER — ONDANSETRON HCL 4 MG/2ML IJ SOLN
INTRAMUSCULAR | Status: DC | PRN
  Administered 2014-09-30: 4 mg via INTRAVENOUS

## 2014-09-30 MED ORDER — ARIPIPRAZOLE 5 MG PO TABS
5.0000 mg | ORAL_TABLET | Freq: Every day | ORAL | Status: DC
  Administered 2014-09-30 – 2014-10-01 (×2): 5 mg via ORAL
  Filled 2014-09-30 (×2): qty 1

## 2014-09-30 MED ORDER — PHENYLEPHRINE HCL 10 MG/ML IJ SOLN
INTRAMUSCULAR | Status: DC | PRN
  Administered 2014-09-30: 120 ug via INTRAVENOUS
  Administered 2014-09-30: 80 ug via INTRAVENOUS
  Administered 2014-09-30: 40 ug via INTRAVENOUS
  Administered 2014-09-30 (×2): 80 ug via INTRAVENOUS

## 2014-09-30 MED ORDER — THROMBIN 5000 UNITS EX SOLR
OROMUCOSAL | Status: DC | PRN
  Administered 2014-09-30: 10:00:00 via TOPICAL

## 2014-09-30 MED ORDER — SODIUM CHLORIDE 0.9 % IR SOLN
Status: DC | PRN
  Administered 2014-09-30: 10:00:00

## 2014-09-30 MED ORDER — PROPOFOL 10 MG/ML IV BOLUS
INTRAVENOUS | Status: DC | PRN
  Administered 2014-09-30: 150 mg via INTRAVENOUS
  Administered 2014-09-30: 50 mg via INTRAVENOUS

## 2014-09-30 MED ORDER — FENOFIBRATE 54 MG PO TABS
54.0000 mg | ORAL_TABLET | Freq: Every day | ORAL | Status: DC
  Administered 2014-10-01: 54 mg via ORAL
  Filled 2014-09-30: qty 1

## 2014-09-30 MED ORDER — POTASSIUM CHLORIDE IN NACL 20-0.9 MEQ/L-% IV SOLN
INTRAVENOUS | Status: DC
  Filled 2014-09-30 (×3): qty 1000

## 2014-09-30 MED ORDER — DEXAMETHASONE SODIUM PHOSPHATE 4 MG/ML IJ SOLN
4.0000 mg | Freq: Four times a day (QID) | INTRAMUSCULAR | Status: DC
  Filled 2014-09-30 (×4): qty 1

## 2014-09-30 MED ORDER — HYDROMORPHONE HCL 1 MG/ML IJ SOLN
0.2500 mg | INTRAMUSCULAR | Status: DC | PRN
  Administered 2014-09-30 (×4): 0.5 mg via INTRAVENOUS

## 2014-09-30 MED ORDER — MIDAZOLAM HCL 2 MG/2ML IJ SOLN
INTRAMUSCULAR | Status: AC
  Filled 2014-09-30: qty 2

## 2014-09-30 MED ORDER — SODIUM CHLORIDE 0.9 % IV SOLN: 250.0000 mL | INTRAVENOUS | Status: DC

## 2014-09-30 MED ORDER — MORPHINE SULFATE 2 MG/ML IJ SOLN
1.0000 mg | INTRAMUSCULAR | Status: DC | PRN
  Administered 2014-09-30: 2 mg via INTRAVENOUS
  Filled 2014-09-30: qty 1

## 2014-09-30 MED ORDER — CELECOXIB 200 MG PO CAPS
200.0000 mg | ORAL_CAPSULE | Freq: Two times a day (BID) | ORAL | Status: DC
  Administered 2014-09-30 – 2014-10-01 (×3): 200 mg via ORAL
  Filled 2014-09-30 (×4): qty 1

## 2014-09-30 MED ORDER — ARTIFICIAL TEARS OP OINT
TOPICAL_OINTMENT | OPHTHALMIC | Status: DC | PRN
  Administered 2014-09-30: 1 via OPHTHALMIC

## 2014-09-30 MED ORDER — OXYCODONE-ACETAMINOPHEN 5-325 MG PO TABS
1.0000 | ORAL_TABLET | ORAL | Status: DC | PRN
  Administered 2014-09-30 – 2014-10-01 (×5): 2 via ORAL
  Filled 2014-09-30 (×5): qty 2

## 2014-09-30 MED ORDER — OXYCODONE HCL 5 MG/5ML PO SOLN: 5.0000 mg | Freq: Once | ORAL | Status: AC | PRN

## 2014-09-30 MED ORDER — ASPIRIN EC 81 MG PO TBEC
81.0000 mg | DELAYED_RELEASE_TABLET | Freq: Two times a day (BID) | ORAL | Status: DC
  Administered 2014-09-30 – 2014-10-01 (×3): 81 mg via ORAL
  Filled 2014-09-30 (×4): qty 1

## 2014-09-30 MED ORDER — SUCCINYLCHOLINE CHLORIDE 20 MG/ML IJ SOLN
INTRAMUSCULAR | Status: AC
  Filled 2014-09-30: qty 1

## 2014-09-30 MED ORDER — DIPHENHYDRAMINE HCL 50 MG/ML IJ SOLN
INTRAMUSCULAR | Status: AC
  Filled 2014-09-30: qty 1

## 2014-09-30 MED ORDER — FLUTICASONE FUROATE 27.5 MCG/SPRAY NA SUSP: 2.0000 | Freq: Every day | NASAL | Status: DC

## 2014-09-30 MED ORDER — LISINOPRIL 20 MG PO TABS
20.0000 mg | ORAL_TABLET | Freq: Every day | ORAL | Status: DC
  Administered 2014-09-30 – 2014-10-01 (×2): 20 mg via ORAL
  Filled 2014-09-30 (×2): qty 1

## 2014-09-30 MED ORDER — PROMETHAZINE HCL 25 MG/ML IJ SOLN: 6.2500 mg | INTRAMUSCULAR | Status: DC | PRN

## 2014-09-30 MED ORDER — EPHEDRINE SULFATE 50 MG/ML IJ SOLN
INTRAMUSCULAR | Status: DC | PRN
  Administered 2014-09-30: 20 mg via INTRAVENOUS

## 2014-09-30 MED ORDER — MENTHOL 3 MG MT LOZG: 1.0000 | LOZENGE | OROMUCOSAL | Status: DC | PRN

## 2014-09-30 MED ORDER — METHOCARBAMOL 1000 MG/10ML IJ SOLN
500.0000 mg | Freq: Four times a day (QID) | INTRAVENOUS | Status: DC | PRN
  Filled 2014-09-30: qty 5

## 2014-09-30 MED ORDER — LOSARTAN POTASSIUM 50 MG PO TABS
100.0000 mg | ORAL_TABLET | Freq: Every day | ORAL | Status: DC
  Administered 2014-10-01: 100 mg via ORAL
  Filled 2014-09-30: qty 2

## 2014-09-30 MED ORDER — OXYCODONE HCL 5 MG PO TABS
5.0000 mg | ORAL_TABLET | Freq: Once | ORAL | Status: AC | PRN
  Administered 2014-09-30: 5 mg via ORAL

## 2014-09-30 MED ORDER — SODIUM CHLORIDE 0.9 % IJ SOLN: 3.0000 mL | INTRAMUSCULAR | Status: DC | PRN

## 2014-09-30 MED ORDER — ROCURONIUM BROMIDE 50 MG/5ML IV SOLN
INTRAVENOUS | Status: AC
Start: 2014-09-30 — End: 2014-09-30
  Filled 2014-09-30: qty 1

## 2014-09-30 MED ORDER — SUCCINYLCHOLINE CHLORIDE 20 MG/ML IJ SOLN
INTRAMUSCULAR | Status: DC | PRN
  Administered 2014-09-30: 100 mg via INTRAVENOUS

## 2014-09-30 MED ORDER — LACTATED RINGERS IV SOLN
INTRAVENOUS | Status: DC
  Administered 2014-09-30 (×2): via INTRAVENOUS

## 2014-09-30 SURGICAL SUPPLY — 63 items
BAG DECANTER FOR FLEXI CONT (MISCELLANEOUS) ×2 IMPLANT
BENZOIN TINCTURE PRP APPL 2/3 (GAUZE/BANDAGES/DRESSINGS) ×2 IMPLANT
BLADE CLIPPER SURG (BLADE) IMPLANT
BONE MATRIX OSTEOCEL PRO SM (Bone Implant) ×4 IMPLANT
BUR MATCHSTICK NEURO 3.0 LAGG (BURR) ×2 IMPLANT
CAGE COROENT PLIF 10X28-8 LUMB (Cage) ×4 IMPLANT
CANISTER SUCT 3000ML (MISCELLANEOUS) ×2 IMPLANT
CONT SPEC 4OZ CLIKSEAL STRL BL (MISCELLANEOUS) ×4 IMPLANT
COVER BACK TABLE 24X17X13 BIG (DRAPES) IMPLANT
COVER TABLE BACK 60X90 (DRAPES) ×2 IMPLANT
DRAPE C-ARM 42X72 X-RAY (DRAPES) ×2 IMPLANT
DRAPE C-ARMOR (DRAPES) ×2 IMPLANT
DRAPE LAPAROTOMY 100X72X124 (DRAPES) ×2 IMPLANT
DRAPE POUCH INSTRU U-SHP 10X18 (DRAPES) ×2 IMPLANT
DRAPE SURG 17X23 STRL (DRAPES) ×2 IMPLANT
DRSG OPSITE 4X5.5 SM (GAUZE/BANDAGES/DRESSINGS) ×2 IMPLANT
DRSG OPSITE POSTOP 4X6 (GAUZE/BANDAGES/DRESSINGS) ×2 IMPLANT
DRSG TELFA 3X8 NADH (GAUZE/BANDAGES/DRESSINGS) ×2 IMPLANT
DURAPREP 26ML APPLICATOR (WOUND CARE) ×2 IMPLANT
ELECT REM PT RETURN 9FT ADLT (ELECTROSURGICAL) ×2
ELECTRODE REM PT RTRN 9FT ADLT (ELECTROSURGICAL) ×1 IMPLANT
EVACUATOR 1/8 PVC DRAIN (DRAIN) ×2 IMPLANT
GAUZE SPONGE 4X4 16PLY XRAY LF (GAUZE/BANDAGES/DRESSINGS) IMPLANT
GLOVE BIO SURGEON STRL SZ8 (GLOVE) ×2 IMPLANT
GLOVE BIOGEL PI IND STRL 7.5 (GLOVE) ×2 IMPLANT
GLOVE BIOGEL PI IND STRL 8 (GLOVE) ×1 IMPLANT
GLOVE BIOGEL PI INDICATOR 7.5 (GLOVE) ×2
GLOVE BIOGEL PI INDICATOR 8 (GLOVE) ×1
GLOVE ECLIPSE 7.5 STRL STRAW (GLOVE) ×6 IMPLANT
GOWN STRL REUS W/ TWL LRG LVL3 (GOWN DISPOSABLE) IMPLANT
GOWN STRL REUS W/ TWL XL LVL3 (GOWN DISPOSABLE) ×4 IMPLANT
GOWN STRL REUS W/TWL 2XL LVL3 (GOWN DISPOSABLE) IMPLANT
GOWN STRL REUS W/TWL LRG LVL3 (GOWN DISPOSABLE)
GOWN STRL REUS W/TWL XL LVL3 (GOWN DISPOSABLE) ×4
HEMOSTAT POWDER KIT SURGIFOAM (HEMOSTASIS) IMPLANT
KIT BASIN OR (CUSTOM PROCEDURE TRAY) ×2 IMPLANT
KIT ROOM TURNOVER OR (KITS) ×2 IMPLANT
MILL MEDIUM DISP (BLADE) IMPLANT
NEEDLE HYPO 25X1 1.5 SAFETY (NEEDLE) ×2 IMPLANT
NS IRRIG 1000ML POUR BTL (IV SOLUTION) ×2 IMPLANT
PACK LAMINECTOMY NEURO (CUSTOM PROCEDURE TRAY) ×2 IMPLANT
PAD ARMBOARD 7.5X6 YLW CONV (MISCELLANEOUS) ×6 IMPLANT
ROD 35MM (Rod) ×4 IMPLANT
SCREW LOCK (Screw) ×4 IMPLANT
SCREW LOCK FXNS SPNE MAS PL (Screw) ×4 IMPLANT
SCREW MAS PLIF 5.5X30 (Screw) ×2 IMPLANT
SCREW PAS PLIF 5X30 (Screw) ×2 IMPLANT
SCREW SHANK 5.0X30MM (Screw) ×4 IMPLANT
SCREW TULIP 5.5 (Screw) ×4 IMPLANT
SPONGE LAP 4X18 X RAY DECT (DISPOSABLE) IMPLANT
SPONGE SURGIFOAM ABS GEL 100 (HEMOSTASIS) ×2 IMPLANT
STRIP CLOSURE SKIN 1/2X4 (GAUZE/BANDAGES/DRESSINGS) ×4 IMPLANT
SUT VIC AB 0 CT1 18XCR BRD8 (SUTURE) ×1 IMPLANT
SUT VIC AB 0 CT1 8-18 (SUTURE) ×1
SUT VIC AB 2-0 CP2 18 (SUTURE) ×2 IMPLANT
SUT VIC AB 3-0 SH 8-18 (SUTURE) ×4 IMPLANT
SYR 20ML ECCENTRIC (SYRINGE) ×2 IMPLANT
SYR 3ML LL SCALE MARK (SYRINGE) IMPLANT
TAPE STRIPS DRAPE STRL (GAUZE/BANDAGES/DRESSINGS) ×2 IMPLANT
TOWEL OR 17X24 6PK STRL BLUE (TOWEL DISPOSABLE) ×2 IMPLANT
TOWEL OR 17X26 10 PK STRL BLUE (TOWEL DISPOSABLE) ×2 IMPLANT
TRAY FOLEY CATH 14FRSI W/METER (CATHETERS) ×2 IMPLANT
WATER STERILE IRR 1000ML POUR (IV SOLUTION) ×2 IMPLANT

## 2014-09-30 NOTE — Evaluation (Addendum)
Occupational Therapy Evaluation Patient Details Name: Brenda Carney MRN: [id number] DOB: [birth year] Today's Date: 09/30/2014    History of Present Illness 63 y.o. s/p MAXIMUM ACCESS (MAS) POSTERIOR LUMBAR INTERBODY FUSION (PLIF) 1 LEVEL.   Clinical Impression   Pt s/p above. Pt independent with ADLs, PTA. Feel pt will benefit from acute OT to increase independence and reinforce precautions prior to d/c.     Follow Up Recommendations  No OT follow up;Supervision - Intermittent    Equipment Recommendations  None recommended by OT    Recommendations for Other Services       Precautions / Restrictions Precautions Precautions: Back;Fall Precaution Booklet Issued: Yes (comment) Precaution Comments: Educated on precautions Required Braces or Orthoses: Spinal Brace Spinal Brace: Lumbar corset;Applied in sitting position Restrictions Weight Bearing Restrictions: No      Mobility Bed Mobility Overal bed mobility: Needs Assistance Bed Mobility: Rolling;Sidelying to Sit;Sit to Sidelying Rolling: Supervision Sidelying to sit: Supervision     Sit to sidelying: Supervision General bed mobility comments: cues for log roll technique  Transfers Overall transfer level: Needs assistance   Transfers: Sit to/from Stand Sit to Stand: Min guard;Supervision         General transfer comment: cues for technique.    Balance                                            ADL Overall ADL's : Needs assistance/impaired     Grooming: Wash/dry face;Set up;Supervision/safety;Standing           Upper Body Dressing : Set up;Supervision/safety;Sitting   Lower Body Dressing: Min guard;Sit to/from stand   Toilet Transfer: Min guard;Ambulation;Regular Toilet;Grab bars   Toileting- Clothing Manipulation and Hygiene: Supervision/safety;Sit to/from stand       Functional mobility during ADLs: Min guard;Minimal assistance General ADL Comments: Educated on safety tips  for home (safe shoewear, rugs, pet). Educated on back brace. Educated on use of cup for teeth care and placement of grooming items to avoid breaking precautions. Educated that AE is available if LB ADLs becomes issue- can cross legs over knees today. Recommended spouse be with her for tub transfer. Discussed tub transfer technique.     Vision                     Perception     Praxis      Pertinent Vitals/Pain Pain Assessment: 0-10 Pain Score: 6  Pain Location: back Pain Descriptors / Indicators: Aching Pain Intervention(s): Repositioned     Hand Dominance Right   Extremity/Trunk Assessment Upper Extremity Assessment Upper Extremity Assessment: Overall WFL for tasks assessed   Lower Extremity Assessment Lower Extremity Assessment: Defer to PT evaluation       Communication Communication Communication: No difficulties   Cognition Arousal/Alertness: Awake/alert Behavior During Therapy: WFL for tasks assessed/performed Overall Cognitive Status: Within Functional Limits for tasks assessed                     General Comments       Exercises       Shoulder Instructions      Home Living Family/patient expects to be discharged to:: Private residence Living Arrangements: Spouse/significant other Available Help at Discharge: Family;Available PRN/intermittently Type of Home: House Home Access: Stairs to enter Entrance Stairs-Number of Steps: 2   Home Layout: Two level  Bathroom Shower/Tub: Teacher, early years/pre: Standard (sink close by)     Home Equipment: Hand held shower head;Shower seat          Prior Functioning/Environment Level of Independence: Independent             OT Diagnosis: Acute pain   OT Problem List: Impaired balance (sitting and/or standing);Decreased knowledge of use of DME or AE;Decreased knowledge of precautions;Pain   OT Treatment/Interventions: Self-care/ADL training;DME and/or AE  instruction;Therapeutic activities;Patient/family education;Balance training    OT Goals(Current goals can be found in the care plan section) Acute Rehab OT Goals Patient Stated Goal: not stated OT Goal Formulation: With patient Time For Goal Achievement: 10/07/14 Potential to Achieve Goals: Good ADL Goals Pt Will Perform Lower Body Dressing: with modified independence;sit to/from stand Pt Will Transfer to Toilet: with modified independence;ambulating;grab bars;regular height toilet Pt Will Perform Tub/Shower Transfer: Tub transfer;with supervision;ambulating;shower seat  OT Frequency: Min 2X/week   Barriers to D/C:            Co-evaluation              End of Session Equipment Utilized During Treatment: Gait belt;Back brace  Activity Tolerance: Patient tolerated treatment well Patient left: in bed;with call bell/phone within reach;with family/visitor present   Time: 7591-6384 OT Time Calculation (min): 27 min Charges:  OT General Charges $OT Visit: 1 Procedure OT Evaluation $Initial OT Evaluation Tier I: 1 Procedure OT Treatments $Self Care/Home Management : 8-22 mins G-CodesBenito Mccreedy OTR/L [phone] 09/30/2014, 5:11 PM

## 2014-09-30 NOTE — Anesthesia Procedure Notes (Signed)
Procedure Name: Intubation Date/Time: 09/30/2014 9:26 AM Performed by: Julian Reil Pre-anesthesia Checklist: Patient identified, Emergency Drugs available, Suction available and Patient being monitored Patient Re-evaluated:Patient Re-evaluated prior to inductionOxygen Delivery Method: Circle system utilized Preoxygenation: Pre-oxygenation with 100% oxygen Intubation Type: IV induction Ventilation: Mask ventilation without difficulty Laryngoscope Size: Mac and 3 Grade View: Grade II Tube type: Oral Tube size: 7.0 mm Number of attempts: 1 Airway Equipment and Method: Stylet Placement Confirmation: ETT inserted through vocal cords under direct vision,  positive ETCO2 and breath sounds checked- equal and bilateral Secured at: 21 cm Tube secured with: Tape Dental Injury: Teeth and Oropharynx as per pre-operative assessment

## 2014-09-30 NOTE — H&P (Signed)
Subjective: Patient is a 63 y.o. female admitted for PLIF L4-5. Onset of symptoms was several months ago, gradually worsening since that time.  The pain is rated severe, and is located at the across the lower back and radiates to legs. The pain is described as aching and occurs all day. The symptoms have been progressive. Symptoms are exacerbated by exercise and standing. 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 TIA (transient ischemic attack) 09/2011  . Cerebrovascular disease     Carotid dopplers which showed no significant increase in velocities, extremely minimal on the left, antegrade vertebral bilaterally.  . 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 Hypertension, benign   . Hyperlipidemia with target LDL less than 100   . Headache(784.0) 10/07/2013  . Cervical spondylosis   . Lumbosacral spondylosis   . Migraine headache   . History of stress test 03/2008    the post stress myocardial perfusion images show a normal pattern of perfusion in all regions. The post ventricle is normal in size. There is no scintigraphic evidence of inducible myocardial ichemia. There is prominent gut uptake activity noted in the infero-apical region.  . Arthritis   . Exercise-induced asthma with acute exacerbation     no recen exacerbation  . History of IBS     resovlved  . Heart murmur     little  . Sleep apnea     does not wear CPAP  . Pneumonia   . GERD (gastroesophageal reflux disease)   . Hx of multiple concussions     Past Surgical History  Procedure Laterality Date  . 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 Foot fracture surgery      Multiple procedures, bilateral feet  . Left a.c. joint surgery  1990s    Prior to Admission medications   Medication Sig Start Date End Date Taking? Authorizing Provider  ARIPiprazole (ABILIFY) 5 MG  tablet Take 5 mg by mouth daily.    Yes Historical Provider, MD  aspirin EC 81 MG tablet Take 81 mg by mouth 2 (two) times daily.   Yes Historical Provider, MD  co-enzyme Q-10 30 MG capsule Take 30 mg by mouth daily.   Yes Historical Provider, MD  desvenlafaxine (PRISTIQ) 100 MG 24 hr tablet Take 100 mg by mouth daily.   Yes Historical Provider, MD  diazepam (VALIUM) 10 MG tablet Take 10 mg by mouth daily as needed for anxiety.   Yes Historical Provider, MD  fenofibrate (TRICOR) 48 MG tablet Take 1 tablet (48 mg total) by mouth daily. 06/24/14  Yes Leonie Man, MD  fluticasone (VERAMYST) 27.5 MCG/SPRAY nasal spray Place 2 sprays into both nostrils as needed for rhinitis or allergies.   Yes Historical Provider, MD  Ipratropium-Albuterol (COMBIVENT RESPIMAT) 20-100 MCG/ACT AERS respimat Inhale 2 puffs into the lungs every 6 (six) hours as needed for wheezing.   Yes Historical Provider, MD  Javier Docker Oil 300 MG CAPS Take 300 mg by mouth daily.   Yes Historical Provider, MD  lisinopril (PRINIVIL) 10 MG tablet Take 2 tablets (20 mg total) by mouth daily. 10/07/13  Yes Kathrynn Ducking, MD  lisinopril-hydrochlorothiazide (PRINZIDE,ZESTORETIC) 20-25 MG per tablet Take 1 tablet by mouth daily. 10/07/13  Yes Kathrynn Ducking, MD  losartan (COZAAR) 100 MG tablet Take 100 mg by mouth daily.  Yes Historical Provider, MD  metoprolol succinate (TOPROL-XL) 25 MG 24 hr tablet Take 25 mg by mouth every evening.   Yes Historical Provider, MD  multivitamin-iron-minerals-folic acid (CENTRUM) chewable tablet Chew 1 tablet by mouth daily.   Yes Historical Provider, MD  Pitavastatin Calcium (LIVALO) 4 MG TABS Take 4 mg by mouth daily.   Yes Historical Provider, MD  Tapentadol HCl (NUCYNTA ER) 100 MG TB12 Take 100 mg by mouth daily.   Yes Historical Provider, MD  Tetrahydrozoline HCl (VISINE OP) Place 1 drop into both eyes daily as needed (dry eyes).   Yes Historical Provider, MD  zolpidem (AMBIEN) 10 MG tablet Take 15 mg  by mouth at bedtime as needed for sleep.   Yes Historical Provider, MD   Allergies  Allergen Reactions  . Lyrica [Pregabalin] Other (See Comments)    DRY MOUTH  . Penicillins Swelling    Childhood reaction- tongue swelled    History  Substance Use Topics  . Smoking status: Current Some Day Smoker    Last Attempt to Quit: 06/09/2010  . Smokeless tobacco: Never Used  . Alcohol Use: Yes     Comment: ocassional wine    Family History  Problem Relation Age of Onset  . Stroke Father   . Heart attack Mother   . Stroke Mother      Review of Systems  Positive ROS: neg  All other systems have been reviewed and were otherwise negative with the exception of those mentioned in the HPI and as above.  Objective: Vital signs in last 24 hours: Temp:  [97.9 F (36.6 C)] 97.9 F (36.6 C) (10/07 0818) Pulse Rate:  [76] 76 (10/07 0759) Resp:  [20] 20 (10/07 0759) BP: (165)/(102) 165/102 mmHg (10/07 0759) SpO2:  [97 %] 97 % (10/07 0759)  General Appearance: Alert, cooperative, no distress, appears stated age Head: Normocephalic, without obvious abnormality, atraumatic Eyes: PERRL, conjunctiva/corneas clear, EOM's intact    Neck: Supple, symmetrical, trachea midline Back: Symmetric, no curvature, ROM normal, no CVA tenderness Lungs:  respirations unlabored Heart: Regular rate and rhythm Abdomen: Soft, non-tender Extremities: Extremities normal, atraumatic, no cyanosis or edema Pulses: 2+ and symmetric all extremities Skin: Skin color, texture, turgor normal, no rashes or lesions  NEUROLOGIC:   Mental status: Alert and oriented x4,  no aphasia, good attention span, fund of knowledge, and memory Motor Exam - grossly normal Sensory Exam - grossly normal Reflexes: 1+ Coordination - grossly normal Gait - grossly normal Balance - grossly normal Cranial Nerves: I: smell Not tested  II: visual acuity  OS: nl    OD: nl  II: visual fields Full to confrontation  II: pupils Equal,  round, reactive to light  III,VII: ptosis None  III,IV,VI: extraocular muscles  Full ROM  V: mastication Normal  V: facial light touch sensation  Normal  V,VII: corneal reflex  Present  VII: facial muscle function - upper  Normal  VII: facial muscle function - lower Normal  VIII: hearing Not tested  IX: soft palate elevation  Normal  IX,X: gag reflex Present  XI: trapezius strength  5/5  XI: sternocleidomastoid strength 5/5  XI: neck flexion strength  5/5  XII: tongue strength  Normal    Data Review Lab Results  Component Value Date   WBC 9.4 09/17/2014   HGB 16.0* 09/17/2014   HCT 45.2 09/17/2014   MCV 100.7* 09/17/2014   PLT 359 09/17/2014   Lab Results  Component Value Date   NA 141 09/17/2014  K 3.8 09/17/2014   CL 99 09/17/2014   CO2 25 09/17/2014   BUN 10 09/17/2014   CREATININE 0.70 09/17/2014   GLUCOSE 94 09/17/2014   Lab Results  Component Value Date   INR 0.88 09/17/2014    Assessment/Plan: Patient admitted for PLIF L4-5. Patient has failed a reasonable attempt at conservative therapy.  I explained the condition and procedure to the patient and answered any questions.  Patient wishes to proceed with procedure as planned. Understands risks/ benefits and typical outcomes of procedure.   Lasheena Frieze S 09/30/2014 9:13 AM

## 2014-09-30 NOTE — Transfer of Care (Signed)
Immediate Anesthesia Transfer of Care Note  Patient: Brenda Carney  Procedure(s) Performed: Procedure(s) with comments: FOR MAXIMUM ACCESS (MAS) POSTERIOR LUMBAR INTERBODY FUSION (PLIF) 1 LEVEL (N/A) - FOR MAXIMUM ACCESS (MAS) POSTERIOR LUMBAR INTERBODY FUSION (PLIF) 1 LEVEL LUMBAR 4-5  Patient Location: PACU  Anesthesia Type:General  Level of Consciousness: awake, alert , oriented and patient cooperative  Airway & Oxygen Therapy: Patient Spontanous Breathing and Patient connected to nasal cannula oxygen  Post-op Assessment: Report given to PACU RN, Post -op Vital signs reviewed and stable and Patient moving all extremities  Post vital signs: Reviewed and stable  Complications: No apparent anesthesia complications

## 2014-09-30 NOTE — Anesthesia Postprocedure Evaluation (Signed)
Anesthesia Post Note  Patient: Brenda Carney  Procedure(s) Performed: Procedure(s) (LRB): FOR MAXIMUM ACCESS (MAS) POSTERIOR LUMBAR INTERBODY FUSION (PLIF) 1 LEVEL (N/A)  Anesthesia type: general  Patient location: PACU  Post pain: Pain level controlled  Post assessment: Patient's Cardiovascular Status Stable  Last Vitals:  Filed Vitals:   09/30/14 1600  BP: 121/83  Pulse: 70  Temp:   Resp: 16    Post vital signs: Reviewed and stable  Level of consciousness: sedated  Complications: No apparent anesthesia complications

## 2014-09-30 NOTE — Op Note (Signed)
09/30/2014  12:06 PM  PATIENT:  Brenda Carney  63 y.o. female  PRE-OPERATIVE DIAGNOSIS:  Spondylolisthesis L4-5, spinal stenosis L4-5, back and leg pain  POST-OPERATIVE DIAGNOSIS:  same  PROCEDURE:   1. Decompressive lumbar laminectomy L4-5  requiring more work than would be required for a simple exposure of the disk for PLIF in order to adequately decompress the neural elements and address the spinal stenosis 2. Posterior lumbar interbody fusion L4-5 using PEEK interbody cages packed with morcellized allograft and autograft 3. Posterior fixation L4-5 using cortical pedicle screw.    SURGEON:  Sherley Bounds, MD  ASSISTANTS: Dr. Sherwood Gambler  ANESTHESIA:  General  EBL: 100 ml  Total I/O In: -  Out: 275 [Urine:175; Blood:100]  BLOOD ADMINISTERED:none  DRAINS: Hemovac   INDICATION FOR PROCEDURE: This patient presented with a long history of back and bilateral leg pain. Patient understood the risks, benefits, and alternatives and potential outcomes and wished to proceed.  PROCEDURE DETAILS:  The patient was brought to the operating room. After induction of generalized endotracheal anesthesia the patient was rolled into the prone position on chest rolls and all pressure points were padded. The patient's lumbar region was cleaned and then prepped with DuraPrep and draped in the usual sterile fashion. Anesthesia was injected and then a dorsal midline incision was made and carried down to the lumbosacral fascia. The fascia was opened and the paraspinous musculature was taken down in a subperiosteal fashion to expose L4-5. A self-retaining retractor was placed. Intraoperative fluoroscopy confirmed my level, and I started with placement of the L4 cortical pedicle screws. The pedicle screw entry zones were identified utilizing surface landmarks and  AP and lateral fluoroscopy. I scored the cortex with the high-speed drill and then used the hand drill and EMG monitoring to drill an upward and  outward direction into the pedicle. I then tapped line to line, and the tap was also monitored. I then placed a 5-0 x 30 mm cortical pedicle screw into the pedicles of L4 bilaterally. I then turned my attention to the decompression and the spinous process was removed and complete lumbar laminectomies, hemi- facetectomies, and foraminotomies were performed at L4-5. The patient had significant spinal stenosis and this required more work than would be required for a simple exposure of the disc for posterior lumbar interbody fusion. Much more generous decompression was undertaken in order to adequately decompress the neural elements and address the patient's leg pain. The yellow ligament was removed to expose the underlying dura and nerve roots, and generous foraminotomies were performed to adequately decompress the neural elements. Both the exiting and traversing nerve roots were decompressed on both sides until a coronary dilator passed easily along the nerve roots. Once the decompression was complete, I turned my attention to the posterior lower lumbar interbody fusion. The epidural venous vasculature was coagulated and cut sharply. Disc space was incised and the initial discectomy was performed with pituitary rongeurs. The disc space was distracted with sequential distractors to a height of 10 mm. We then used a series of scrapers and shavers to prepare the endplates for fusion. The midline was prepared with Epstein curettes. Once the complete discectomy was finished, we packed an appropriate sized peek interbody cage with local autograft and morcellized allograft, gently retracted the nerve root, and tapped the cage into position at L4-5.  The midline between the cages was packed with morselized autograft and allograft. We then turned our attention to the placement of the lower pedicle screws. The pedicle  screw entry zones were identified utilizing surface landmarks and fluoroscopy. I drilled into each pedicle  utilizing the hand drill and EMG monitoring, and tapped each pedicle with the appropriate tap. We palpated with a ball probe to assure no break in the cortex. We then placed 5-0 x 30 mm cortical pedicle screws into the pedicles bilaterally at L5.  We then placed lordotic rods into the multiaxial screw heads of the pedicle screws and locked these in position with the locking caps and anti-torque device. We then checked our construct with AP and lateral fluoroscopy. Irrigated with copious amounts of bacitracin-containing saline solution. Placed a medium Hemovac drain through separate stab incision. Inspected the nerve roots once again to assure adequate decompression, lined to the dura with Gelfoam, and closed the muscle and the fascia with 0 Vicryl. Closed the subcutaneous tissues with 2-0 Vicryl and subcuticular tissues with 3-0 Vicryl. The skin was closed with benzoin and Steri-Strips. Dressing was then applied, the patient was awakened from general anesthesia and transported to the recovery room in stable condition. At the end of the procedure all sponge, needle and instrument counts were correct.   PLAN OF CARE: Admit to inpatient   PATIENT DISPOSITION:  PACU - hemodynamically stable.   Delay start of Pharmacological VTE agent (>24hrs) due to surgical blood loss or risk of bleeding:  yes

## 2014-09-30 NOTE — Progress Notes (Signed)
ANTIBIOTIC CONSULT NOTE - INITIAL  Pharmacy Consult for vancomycin Indication: surgical prophylaxis  Allergies  Allergen Reactions  . Lyrica [Pregabalin] Other (See Comments)    DRY MOUTH  . Penicillins Swelling    Childhood reaction- tongue swelled    Patient Measurements:   Adjusted Body Weight:   Vital Signs: Temp: 98.1 F (36.7 C) (10/07 1345) BP: 124/83 mmHg (10/07 1345) Pulse Rate: 81 (10/07 1345) Intake/Output from previous day:   Intake/Output from this shift: Total I/O In: 1200 [I.V.:1200] Out: 375 [Urine:275; Blood:100]  Labs: No results found for this basename: WBC, HGB, PLT, LABCREA, CREATININE,  in the last 72 hours The CrCl is unknown because both a height and weight (above a minimum accepted value) are required for this calculation. No results found for this basename: VANCOTROUGH, Corlis Leak, VANCORANDOM, GENTTROUGH, GENTPEAK, GENTRANDOM, TOBRATROUGH, TOBRAPEAK, TOBRARND, AMIKACINPEAK, AMIKACINTROU, AMIKACIN,  in the last 72 hours   Microbiology: Recent Results (from the past 720 hour(s))  SURGICAL PCR SCREEN     Status: Abnormal   Collection Time    09/17/14 11:50 AM      Result Value Ref Range Status   MRSA, PCR NEGATIVE  NEGATIVE Final   Staphylococcus aureus POSITIVE (*) NEGATIVE Final   Comment:            The Xpert SA Assay (FDA     approved for NASAL specimens     in patients over 90 years of age),     is one component of     a comprehensive surveillance     program.  Test performance has     been validated by [employer] for patients greater     than or equal to 5 year old.     It is not intended     to diagnose infection nor to     guide or monitor treatment.    Medical History: Past Medical History  Diagnosis Date  . TIA (transient ischemic attack) 09/2011  . Cerebrovascular disease     Carotid dopplers which showed no significant increase in velocities, extremely minimal on the left, antegrade vertebral bilaterally.  .  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 Hypertension, benign   . Hyperlipidemia with target LDL less than 100   . Headache(784.0) 10/07/2013  . Cervical spondylosis   . Lumbosacral spondylosis   . Migraine headache   . History of stress test 03/2008    the post stress myocardial perfusion images show a normal pattern of perfusion in all regions. The post ventricle is normal in size. There is no scintigraphic evidence of inducible myocardial ichemia. There is prominent gut uptake activity noted in the infero-apical region.  . Arthritis   . Exercise-induced asthma with acute exacerbation     no recen exacerbation  . History of IBS     resovlved  . Heart murmur     little  . Sleep apnea     does not wear CPAP  . Pneumonia   . GERD (gastroesophageal reflux disease)   . Hx of multiple concussions     Medications:  Scheduled:  . ARIPiprazole  5 mg Oral Daily  . aspirin EC  81 mg Oral BID  . celecoxib  200 mg Oral BID  . dexamethasone  4 mg Intravenous 4 times per day   Or  . dexamethasone  4 mg Oral 4 times per day  . fenofibrate  54  mg Oral Daily  . fluticasone  2 spray Each Nare Daily  . HYDROmorphone      . HYDROmorphone      . lisinopril  20 mg Oral Daily  . lisinopril-hydrochlorothiazide  1 tablet Oral Daily  . [START ON 10/01/2014] losartan  100 mg Oral Daily  . metoprolol succinate  25 mg Oral QPM  . oxyCODONE      . sodium chloride  3 mL Intravenous Q12H  . [START ON 10/01/2014] venlafaxine XR  37.5 mg Oral Q breakfast   Infusions:  . sodium chloride    . 0.9 % NaCl with KCl 20 mEq / L     Assessment: 63 yo female s/p neurosurgery will be put on vancomycin x1 for surgical prophylaxis.  Patient got one dose of vancomycin 1g iv x1 at 0938 on 09/30/14.  Per nurse, patient doesn't have a drain.  On 09/17/14, SCr 0.7  Goal of Therapy:  Vancomycin trough level 10-15 mcg/ml  Plan:  - vancomycin 1g iv x1  at 2130. Pharmacy will sign off  Moroni Nester, Tsz-Yin 09/30/2014,2:03 PM

## 2014-09-30 NOTE — Anesthesia Preprocedure Evaluation (Addendum)
Anesthesia Evaluation  Patient identified by MRN, date of birth, ID band Patient awake    Reviewed: Allergy & Precautions, H&P , NPO status , Patient's Chart, lab work & pertinent test results  Airway Mallampati: II TM Distance: >3 FB Neck ROM: Full    Dental  (+) Teeth Intact, Dental Advisory Given, Caps   Pulmonary sleep apnea , Current Smoker,    Pulmonary exam normal       Cardiovascular hypertension, + Peripheral Vascular Disease     Neuro/Psych TIA   GI/Hepatic Neg liver ROS, GERD-  ,  Endo/Other  negative endocrine ROS  Renal/GU negative Renal ROS     Musculoskeletal   Abdominal   Peds  Hematology   Anesthesia Other Findings   Reproductive/Obstetrics                          Anesthesia Physical Anesthesia Plan  ASA: III  Anesthesia Plan: General   Post-op Pain Management:    Induction: Intravenous  Airway Management Planned: Oral ETT  Additional Equipment:   Intra-op Plan:   Post-operative Plan: Extubation in OR  Informed Consent: I have reviewed the patients History and Physical, chart, labs and discussed the procedure including the risks, benefits and alternatives for the proposed anesthesia with the patient or authorized representative who has indicated his/her understanding and acceptance.   Dental advisory given  Plan Discussed with: CRNA, Anesthesiologist and Surgeon  Anesthesia Plan Comments:        Anesthesia Quick Evaluation

## 2014-09-30 NOTE — Progress Notes (Signed)
Pt transported by [facility] Nt

## 2014-10-01 ENCOUNTER — Encounter (HOSPITAL_COMMUNITY): Payer: Self-pay | Admitting: Neurological Surgery

## 2014-10-01 DIAGNOSIS — M4316 Spondylolisthesis, lumbar region: Secondary | ICD-10-CM | POA: Diagnosis not present

## 2014-10-01 MED ORDER — METHOCARBAMOL 500 MG PO TABS: 500.0000 mg | ORAL_TABLET | Freq: Four times a day (QID) | ORAL | Status: DC | PRN

## 2014-10-01 MED ORDER — OXYCODONE-ACETAMINOPHEN 5-325 MG PO TABS: 1.0000 | ORAL_TABLET | ORAL | Status: DC | PRN

## 2014-10-01 NOTE — Evaluation (Signed)
Physical Therapy Evaluation Patient Details Name: Brenda Carney MRN: [id number] DOB: 25-Jul-1951 Today's Date: 10/01/2014   History of Present Illness  63 y.o. s/p MAXIMUM ACCESS (MAS) POSTERIOR LUMBAR INTERBODY FUSION (PLIF) 1 LEVEL.  Clinical Impression  Education performed, patient mobilizing well, no further acute PT needs, will sign off.     Follow Up Recommendations Supervision - Intermittent    Equipment Recommendations  None recommended by PT    Recommendations for Other Services       Precautions / Restrictions Precautions Precautions: Back Precaution Booklet Issued: Yes (comment) (Provided by OT) Precaution Comments: Educated on precautions with examples. Also reviewed tasks related to pt's occupation that she will need assistance with until back precautions lifted Required Braces or Orthoses: Spinal Brace Spinal Brace: Lumbar corset;Applied in sitting position Restrictions Weight Bearing Restrictions: No      Mobility  Bed Mobility Overal bed mobility: Modified Independent Bed Mobility: Rolling;Sidelying to Sit Rolling: Supervision Sidelying to sit: Supervision       General bed mobility comments: able to demonstrate proper technique  Transfers Overall transfer level: Needs assistance Equipment used: None Transfers: Sit to/from [employer] Transfers Sit to Stand: Supervision Stand pivot transfers: Supervision       General transfer comment: vc for no bending initially; pt able to return demonstrate  Ambulation/Gait Ambulation/Gait assistance: Min guard;Modified independent (Device/Increase time) (min guard progressed to mod I) Ambulation Distance (Feet): 300 Feet Assistive device: None Gait Pattern/deviations: Step-through pattern;Decreased stride length;Antalgic Gait velocity: slow Gait velocity interpretation: Below normal speed for age/gender General Gait Details: no LOB or instability with ambulation. pt able to walk and talk without  change in gait speed.  Limited trunk rotation and reduced arm swing. Mod I due to requiring extra time. Post amb, pt reports decrease in pain "3 or 4"/10.  Stairs Stairs: Yes Stairs assistance: Modified independent (Device/Increase time) Stair Management: One rail Right;Forwards Number of Stairs: 9 General stair comments: Pt demonstrate step through pattern. pt requires increased time to complete stairs  Wheelchair Mobility    Modified Rankin (Stroke Patients Only)       Balance Overall balance assessment: No apparent balance deficits (not formally assessed) Sitting-balance support: Feet supported Sitting balance-Leahy Scale: Good Sitting balance - Comments: no significant postural sway or assistance required.   Standing balance support: During functional activity;No upper extremity supported Standing balance-Leahy Scale: Good Standing balance comment: No LOB or instability with standing             High level balance activites: Head turns High Level Balance Comments: pt able to amb with head turns without reduction in gait speed.              Pertinent Vitals/Pain Pain Assessment: No/denies pain Pain Score: 5  Pain Location: back Pain Intervention(s): Monitored during session    [place] expects to be discharged to:: Private residence Living Arrangements: Spouse/significant other Available Help at Discharge: Family;Available PRN/intermittently Type of Home: House Home Access: Stairs to enter   Entrance Stairs-Number of Steps: 2 Home Layout: Two level Home Equipment: Hospital bed      Prior Function Level of Independence: Independent         Comments: pt reports difficulty with stairs having to go down stairs sitting PTA 2/2 numbness.      Hand Dominance        Extremity/Trunk Assessment   Upper Extremity Assessment: Defer to OT evaluation           Lower Extremity Assessment:  Overall WFL for tasks assessed          Communication   Communication: No difficulties  Cognition Arousal/Alertness: Awake/alert Behavior During Therapy: WFL for tasks assessed/performed Overall Cognitive Status: Within Functional Limits for tasks assessed                      General Comments General comments (skin integrity, edema, etc.): pt educated in detail about aspects of her occupation that she will require assistance with until back precautions no longer apply, pt agreeable. Pt independent with application of brace and able to maintain precautions during application.     Exercises        Assessment/Plan    PT Assessment Patent does not need any further PT services  PT Diagnosis Difficulty walking;Acute pain   PT Problem List    PT Treatment Interventions     PT Goals (Current goals can be found in the Care Plan section) Acute Rehab PT Goals Patient Stated Goal: to go home PT Goal Formulation: With patient    Frequency     Barriers to discharge        Co-evaluation               End of Session Equipment Utilized During Treatment: Gait belt;Back brace Activity Tolerance: Patient tolerated treatment well Patient left: in chair;with call bell/phone within reach Nurse Communication: Mobility status         Time: 6256-3893 PT Time Calculation (min): 17 min   Charges:   PT Evaluation $Initial PT Evaluation Tier I: 1 Procedure PT Treatments $Gait Training: 8-22 mins   PT G CodesDuncan Dull 10/01/2014, 10:16 AM Alben Deeds, [place] DPT  [phone]

## 2014-10-01 NOTE — Discharge Summary (Signed)
Physician Discharge Summary  Patient ID: Brenda Carney MRN: [id number] DOB/AGE: [birth year] 63 y.o.  Admit date: 09/30/2014 Discharge date: 10/01/2014  Admission Diagnoses: Spondylolisthesis with stenosis L4-5    Discharge Diagnoses: Same   Discharged Condition: good  Hospital Course: The patient was admitted on 09/30/2014 and taken to the operating room where the patient underwent PLIF L4-5. The patient tolerated the procedure well and was taken to the recovery room and then to the floor in stable condition. The hospital course was routine. There were no complications. The wound remained clean dry and intact. Pt had appropriate back soreness. No complaints of leg pain or new N/T/W. The patient remained afebrile with stable vital signs, and tolerated a regular diet. The patient continued to increase activities, and pain was well controlled with oral pain medications.   Consults: None  Significant Diagnostic Studies:  Results for orders placed during the hospital encounter of 09/17/14  SURGICAL PCR SCREEN      Result Value Ref Range   MRSA, PCR NEGATIVE  NEGATIVE   Staphylococcus aureus POSITIVE (*) NEGATIVE  BASIC METABOLIC PANEL      Result Value Ref Range   Sodium 141  137 - 147 mEq/L   Potassium 3.8  3.7 - 5.3 mEq/L   Chloride 99  96 - 112 mEq/L   CO2 25  19 - 32 mEq/L   Glucose, Bld 94  70 - 99 mg/dL   BUN 10  6 - 23 mg/dL   Creatinine, Ser 0.70  0.50 - 1.10 mg/dL   Calcium 9.9  8.4 - 10.5 mg/dL   GFR calc non Af Amer >90  >90 mL/min   GFR calc Af Amer >90  >90 mL/min   Anion gap 17 (*) 5 - 15  CBC WITH DIFFERENTIAL      Result Value Ref Range   WBC 9.4  4.0 - 10.5 K/uL   RBC 4.49  3.87 - 5.11 MIL/uL   Hemoglobin 16.0 (*) 12.0 - 15.0 g/dL   HCT 45.2  36.0 - 46.0 %   MCV 100.7 (*) 78.0 - 100.0 fL   MCH 35.6 (*) 26.0 - 34.0 pg   MCHC 35.4  30.0 - 36.0 g/dL   RDW 12.9  11.5 - 15.5 %   Platelets 359  150 - 400 K/uL   Neutrophils Relative % 65  43 - 77 %   Neutro Abs  6.2  1.7 - 7.7 K/uL   Lymphocytes Relative 24  12 - 46 %   Lymphs Abs 2.3  0.7 - 4.0 K/uL   Monocytes Relative 7  3 - 12 %   Monocytes Absolute 0.6  0.1 - 1.0 K/uL   Eosinophils Relative 3  0 - 5 %   Eosinophils Absolute 0.2  0.0 - 0.7 K/uL   Basophils Relative 1  0 - 1 %   Basophils Absolute 0.1  0.0 - 0.1 K/uL  PROTIME-INR      Result Value Ref Range   Prothrombin Time 11.9  11.6 - 15.2 seconds   INR 0.88  0.00 - 1.49  TYPE AND SCREEN      Result Value Ref Range   ABO/RH(D) A POS     Antibody Screen NEG     Sample Expiration 10/01/2014    ABO/RH      Result Value Ref Range   ABO/RH(D) A POS      Chest 2 View  09/17/2014   CLINICAL DATA:  Preoperative evaluation for lumbar surgery  EXAM: CHEST  2 VIEW  COMPARISON:  11/02/2011  FINDINGS: Cardiac shadow is stable. Old right rib fractures are again seen and stable. The lungs are well aerated bilaterally. No sizable infiltrate or effusion is noted. No acute bony abnormality is seen.  IMPRESSION: No acute abnormality noted.   Electronically Signed   By: Inez Catalina M.D.   On: 09/17/2014 12:29   Dg Lumbar Spine 2-3 Views  09/30/2014   CLINICAL DATA:  Spondylo listhesis  EXAM: DG C-ARM 61-120 MIN; LUMBAR SPINE - 2-3 VIEW  TECHNIQUE: Three-view x-ray intraoperative lumbar spine  CONTRAST:  None  FLUOROSCOPY TIME:  39 seconds  COMPARISON:  MRI 05/27/2014  FINDINGS: Three views of the lumbar spine submitted. The patient is status post posterior metallic fusion with rods and screws at L4-L5 level. There is anatomic alignment. Postoperative changes are noted L4-L5 disc space.  IMPRESSION: Status post posterior metallic fusion Q6-S3 level. There is anatomic alignment.   Electronically Signed   By: Lahoma Crocker M.D.   On: 09/30/2014 12:45   Dg C-arm 1-60 Min  09/30/2014   CLINICAL DATA:  Spondylo listhesis  EXAM: DG C-ARM 61-120 MIN; LUMBAR SPINE - 2-3 VIEW  TECHNIQUE: Three-view x-ray intraoperative lumbar spine  CONTRAST:  None  FLUOROSCOPY TIME:   39 seconds  COMPARISON:  MRI 05/27/2014  FINDINGS: Three views of the lumbar spine submitted. The patient is status post posterior metallic fusion with rods and screws at L4-L5 level. There is anatomic alignment. Postoperative changes are noted L4-L5 disc space.  IMPRESSION: Status post posterior metallic fusion M1-D6 level. There is anatomic alignment.   Electronically Signed   By: Lahoma Crocker M.D.   On: 09/30/2014 12:45    Antibiotics:  Anti-infectives   Start     Dose/Rate Route Frequency Ordered Stop   09/30/14 2130  vancomycin (VANCOCIN) IVPB 1000 mg/200 mL premix     1,000 mg 200 mL/hr over 60 Minutes Intravenous  Once 09/30/14 1412 09/30/14 2210   09/30/14 1027  bacitracin 50,000 Units in sodium chloride irrigation 0.9 % 500 mL irrigation  Status:  Discontinued       As needed 09/30/14 1028 09/30/14 1213   09/30/14 0600  vancomycin (VANCOCIN) IVPB 1000 mg/200 mL premix     1,000 mg 200 mL/hr over 60 Minutes Intravenous On call to O.R. 09/29/14 1419 09/30/14 0928      Discharge Exam: Blood pressure 100/71, pulse 70, temperature 98.3 F (36.8 C), temperature source Oral, resp. rate 20, SpO2 93.00%. Neurologic: Grossly normal Incision clean  Discharge Medications:     Medication List    STOP taking these medications       NUCYNTA ER 100 MG Tb12  Generic drug:  Tapentadol HCl      TAKE these medications       ARIPiprazole 5 MG tablet  Commonly known as:  ABILIFY  Take 5 mg by mouth daily.     aspirin EC 81 MG tablet  Take 81 mg by mouth 2 (two) times daily.     co-enzyme Q-10 30 MG capsule  Take 30 mg by mouth daily.     COMBIVENT RESPIMAT 20-100 MCG/ACT Aers respimat  Generic drug:  Ipratropium-Albuterol  Inhale 2 puffs into the lungs every 6 (six) hours as needed for wheezing.     desvenlafaxine 100 MG 24 hr tablet  Commonly known as:  PRISTIQ  Take 100 mg by mouth daily.     diazepam 10 MG tablet  Commonly known as:  VALIUM  Take 10  mg by mouth daily as  needed for anxiety.     fenofibrate 48 MG tablet  Commonly known as:  TRICOR  Take 1 tablet (48 mg total) by mouth daily.     Krill Oil 300 MG Caps  Take 300 mg by mouth daily.     lisinopril 10 MG tablet  Commonly known as:  PRINIVIL  Take 2 tablets (20 mg total) by mouth daily.     LIVALO 4 MG Tabs  Generic drug:  Pitavastatin Calcium  Take 4 mg by mouth daily.     losartan 100 MG tablet  Commonly known as:  COZAAR  Take 100 mg by mouth daily.     methocarbamol 500 MG tablet  Commonly known as:  ROBAXIN  Take 1 tablet (500 mg total) by mouth every 6 (six) hours as needed for muscle spasms.     metoprolol succinate 25 MG 24 hr tablet  Commonly known as:  TOPROL-XL  Take 25 mg by mouth every evening.     multivitamin-iron-minerals-folic acid chewable tablet  Chew 1 tablet by mouth daily.     oxyCODONE-acetaminophen 5-325 MG per tablet  Commonly known as:  PERCOCET/ROXICET  Take 1-2 tablets by mouth every 4 (four) hours as needed for moderate pain.     VERAMYST 27.5 MCG/SPRAY nasal spray  Generic drug:  fluticasone  Place 2 sprays into both nostrils as needed for rhinitis or allergies.     VISINE OP  Place 1 drop into both eyes daily as needed (dry eyes).     zolpidem 10 MG tablet  Commonly known as:  AMBIEN  Take 15 mg by mouth at bedtime as needed for sleep.        Disposition: Home   Final Dx: PLIF L4-5      Discharge Instructions    Remove dressing in 72 hours    Complete by:  As directed      Call MD for:  difficulty breathing, headache or visual disturbances    Complete by:  As directed      Call MD for:  persistant nausea and vomiting    Complete by:  As directed      Call MD for:  redness, tenderness, or signs of infection (pain, swelling, redness, odor or green/yellow discharge around incision site)    Complete by:  As directed      Call MD for:  severe uncontrolled pain    Complete by:  As directed      Call MD for:  temperature >100.4     Complete by:  As directed      Diet - low sodium heart healthy    Complete by:  As directed      Discharge instructions    Complete by:  As directed   No strenuous activity, no driving, no bending or twisting, may shower     Increase activity slowly    Complete by:  As directed            Follow-up Information   Follow up with Eknoor Novack S, MD. Schedule an appointment as soon as possible for a visit in 3 weeks.   Specialty:  Neurosurgery   Contact information:   [address] [place] [address] [phone]        Signed: Eustace Moore 10/01/2014, 1:13 PM

## 2014-10-01 NOTE — Progress Notes (Signed)
Occupational Therapy Treatment Patient Details Name: Brenda Carney MRN: [id number] DOB: Nov 11, 1951 Today's Date: 10/01/2014    History of present illness 63 y.o. s/p MAXIMUM ACCESS (MAS) POSTERIOR LUMBAR INTERBODY FUSION (PLIF) 1 LEVEL.   OT comments  All education completed. Pt ready for D/C home when medically stable. OT signing off.   Follow Up Recommendations  No OT follow up;Supervision - Intermittent    Equipment Recommendations  None recommended by OT    Recommendations for Other Services      Precautions / Restrictions Precautions Precautions: Back Precaution Booklet Issued: Yes (comment) (Provided by OT) Precaution Comments: Educated on precautions with examples. Also reviewed tasks related to pt's occupation that she will need assistance with until back precautions lifted Required Braces or Orthoses: Spinal Brace Spinal Brace: Lumbar corset;Applied in sitting position Restrictions Weight Bearing Restrictions: No       Mobility Bed Mobility Overal bed mobility: Modified Independent Bed Mobility: Rolling;Sidelying to Sit Rolling: Supervision Sidelying to sit: Supervision       General bed mobility comments: able to demonstrate proper technique  Transfers Overall transfer level: Needs assistance Equipment used: None Transfers: Sit to/from Stand;Stand Pivot Transfers Sit to Stand: Supervision Stand pivot transfers: Supervision       General transfer comment: vc for no bending initially; pt able to return demonstrate    Balance Overall balance assessment: No apparent balance deficits (not formally assessed) Sitting-balance support: Feet supported Sitting balance-Leahy Scale: Good Sitting balance - Comments: no significant postural sway or assistance required.   Standing balance support: During functional activity;No upper extremity supported Standing balance-Leahy Scale: Good Standing balance comment: No LOB or instability with standing             High level balance activites: Head turns High Level Balance Comments: pt able to amb with head turns without reduction in gait speed.    ADL                                         General ADL Comments: Pt able to verbalize 3/3 back precuations . Reviewed ADL using compensatory techniques. Pt did not remember anything from yesterday's OT session. Discussed use of AE for hygiene after toileting and use of AE for household activities. Educated on importance of adhering to precautions and that Dr. Ronnald Ramp would advance her activity level.      Vision                     Perception     Praxis      Cognition   Behavior During Therapy: WFL for tasks assessed/performed Overall Cognitive Status: Within Functional Limits for tasks assessed                       Extremity/Trunk Assessment  Upper Extremity Assessment Upper Extremity Assessment: Defer to OT evaluation   Lower Extremity Assessment Lower Extremity Assessment: Overall WFL for tasks assessed        Exercises     Shoulder Instructions       General Comments      Pertinent Vitals/ Pain       Pain Assessment: No/denies pain Pain Score: 5  Pain Location: back Pain Intervention(s): Monitored during session  Home Living Family/patient expects to be discharged to:: Private residence Living Arrangements: Spouse/significant other Available Help at Discharge: Family;Available PRN/intermittently Type of Home: House  Home Access: Stairs to enter Entrance Stairs-Number of Steps: 2   Home Layout: Two level Alternate Level Stairs-Number of Steps: flight Alternate Level Stairs-Rails: Right (when going up)           Home Equipment: Hospital bed          Prior Functioning/Environment Level of Independence: Independent        Comments: pt reports difficulty with stairs having to go down stairs sitting PTA 2/2 numbness.    Frequency       Progress Toward Goals  OT  Goals(current goals can now be found in the care plan section)  Progress towards OT goals: Goals met/education completed, patient discharged from OT  Acute Rehab OT Goals Patient Stated Goal: to go home OT Goal Formulation: With patient Time For Goal Achievement: 10/07/14 Potential to Achieve Goals: Good ADL Goals Pt Will Perform Lower Body Dressing: with modified independence;sit to/from stand Pt Will Transfer to Toilet: with modified independence;ambulating;grab bars;regular height toilet Pt Will Perform Tub/Shower Transfer: Tub transfer;with supervision;ambulating;shower seat  Plan All goals met and education completed, patient discharged from OT services    Co-evaluation                 End of Session Equipment Utilized During Treatment: Back brace   Activity Tolerance Patient tolerated treatment well   Patient Left in bed;with call bell/phone within reach   Nurse Communication Mobility status        Time: 4604-7998 OT Time Calculation (min): 23 min  Charges: OT General Charges $OT Visit: 1 Procedure OT Treatments $Self Care/Home Management : 23-37 mins  Logan Baltimore,HILLARY 10/01/2014, 9:50 AM   Maurie Boettcher, OTR/L  [phone] 10/01/2014

## 2014-10-01 NOTE — Progress Notes (Signed)
Patient alert and oriented, mae's well, voiding adequate amount of urine, swallowing without difficulty, c/o minimal pain- ice pack in place and pain medication given prior to discharged. Patient discharged home with family. Script and discharged instructions given to patient. Patient and family stated understanding of d/c instructions given and has an appointment with MD. Conley Rolls RN

## 2014-10-22 ENCOUNTER — Ambulatory Visit (INDEPENDENT_AMBULATORY_CARE_PROVIDER_SITE_OTHER): Payer: BC Managed Care – PPO | Admitting: Neurology

## 2014-10-22 ENCOUNTER — Encounter: Payer: Self-pay | Admitting: Neurology

## 2014-10-22 VITALS — BP 165/120 | HR 91 | Temp 97.4°F | Ht 65.0 in | Wt 157.0 lb

## 2014-10-22 DIAGNOSIS — G43719 Chronic migraine without aura, intractable, without status migrainosus: Secondary | ICD-10-CM

## 2014-10-22 DIAGNOSIS — G43709 Chronic migraine without aura, not intractable, without status migrainosus: Secondary | ICD-10-CM

## 2014-10-22 NOTE — Progress Notes (Signed)
  BOTOX PROCEDURE NOTE FOR MIGRAINE HEADACHE   CC: Migraine headaches   HPI: Brenda Carney is a 63 y.o. female here for initial Botox injections for migraine headache. Reports daily severe headaches which are negatively impacting her quality of life.   Contraindications and precautions discussed with patient. Aseptic procedure was observed and patient tolerated procedure. Procedure performed by Dr. Sarina Ill  The condition has existed for more than 6 months, and pt does not have a diagnosis of ALS, Myasthenia Gravis or Lambert-Eaton Syndrome. Risks and benefits of injections discussed and pt agrees to proceed with the procedure. Written consent obtained   These injections are medically necessary. She receives good benefits from these injections. These injections do not cause sedations or hallucinations which the oral therapies may cause.   Indication/Diagnosis: Chronic migraine  Type of toxin: Botox  Lot # [id number] Expiration date: Apr 2018    Description of procedure:  The patient was placed in a sitting position. The standard protocol was used for Botox as follows, with 5 units of Botox injected at each site:   -Procerus muscle, midline injection  -Corrugator muscle, bilateral injection  -Frontalis muscle, bilateral injection, with 2 sites each side, medial injection was performed in the upper one third of the frontalis muscle, in the region vertical from the medial inferior edge of the superior orbital rim. The lateral injection was again in the upper one third of the forehead vertically above the lateral limbus of the cornea, 1.5 cm lateral to the medial injection site.  -Temporalis muscle injection, 4 sites, bilaterally. The first injection was 3 cm above the tragus of the ear, second injection site was 1.5 cm to 3 cm up from the first injection site in line with the tragus of the ear. The third injection site was 1.5-3 cm forward between the first 2 injection sites. The  fourth injection site was 1.5 cm posterior to the second injection site.  -Occipitalis muscle injection, 3 sites, bilaterally. The first injection was done one half way between the occipital protuberance and the tip of the mastoid process behind the ear. The second injection site was done lateral and superior to the first, 1 fingerbreadth from the first injection. cThe third injection site was 1 fingerbreadth superiorly and medially from the first injection site.  -Cervical paraspinal muscle injection, 2 sites, bilateral knee first injection site was 1 cm from the midline of the cervical spine, 3 cm inferior to the lower border of the occipital protuberance. The second injection site was 1.5 cm superiorly and laterally to the first injection site.  -Trapezius muscle injection was performed at 3 sites, bilaterally. The first injection site was in the upper trapezius muscle halfway between the inflection point of the neck, and the acromion. The second injection site was one half way between the acromion and the first injection site. The third injection was done between the first injection site and the inflection point of the neck.   A 200 unit bottle of Botox was used, 155 units were injected, the rest of the Botox was wasted. The patient tolerated the procedure well, there were no complications of the above procedure.  1) Botox injections as detailed above. A total of 155 units was used. 45 units wasted  2) Tylenol or Motrin for injection site pain.  3) Medication guide dispensed.  4) Follow up for repeat injections in 3 months

## 2014-11-11 ENCOUNTER — Encounter: Payer: Self-pay | Admitting: Neurology

## 2014-12-07 ENCOUNTER — Ambulatory Visit
Admission: RE | Admit: 2014-12-07 | Discharge: 2014-12-07 | Disposition: A | Discharge status: Home / Self Care | Payer: BC Managed Care – PPO | Source: Ambulatory Visit | Attending: Internal Medicine | Admitting: Internal Medicine

## 2014-12-07 ENCOUNTER — Other Ambulatory Visit: Payer: Self-pay | Admitting: Internal Medicine

## 2014-12-07 DIAGNOSIS — M25541 Pain in joints of right hand: Secondary | ICD-10-CM

## 2014-12-11 ENCOUNTER — Encounter: Payer: Self-pay | Admitting: Adult Health

## 2014-12-11 ENCOUNTER — Ambulatory Visit
Admission: RE | Admit: 2014-12-11 | Discharge: 2014-12-11 | Disposition: A | Discharge status: Home / Self Care | Payer: BC Managed Care – PPO | Source: Ambulatory Visit | Attending: Pain Medicine | Admitting: Pain Medicine

## 2014-12-11 ENCOUNTER — Other Ambulatory Visit: Payer: Self-pay | Admitting: Pain Medicine

## 2014-12-11 ENCOUNTER — Ambulatory Visit (INDEPENDENT_AMBULATORY_CARE_PROVIDER_SITE_OTHER): Payer: BC Managed Care – PPO | Admitting: Adult Health

## 2014-12-11 VITALS — BP 150/111 | HR 83 | Ht 65.0 in | Wt 152.8 lb

## 2014-12-11 DIAGNOSIS — M542 Cervicalgia: Secondary | ICD-10-CM

## 2014-12-11 DIAGNOSIS — G43019 Migraine without aura, intractable, without status migrainosus: Secondary | ICD-10-CM

## 2014-12-11 NOTE — Progress Notes (Signed)
I have read the note, and I agree with the clinical assessment and plan.  Ronit Marczak KEITH   

## 2014-12-11 NOTE — Patient Instructions (Signed)
Continue with Botox therapy.  Please follow-up with your PCP regarding her Blood pressure. If your symptoms worsen or you develop new symptoms please let us know.

## 2014-12-11 NOTE — Progress Notes (Signed)
PATIENT: Brenda Carney DOB: 11-Jul-1951  REASON FOR VISIT: follow up HISTORY FROM: patient  HISTORY OF PRESENT ILLNESS: Ms. Happel is a 63 year old female with a history of chronic migraines. She returns today for follow-up. The patient is currently receiving Botox injections for her migraines. Her last injection was in October. She reports that the Botox has been beneficial. She states that she has only had one migraine since her Botox injection in October. She states that she will occasionally take Excedrin migraine but not frequently. Patient states that she did see her PCP for her BP. She states that they did adjust her medications but her BP will still run higher at times. She reports that she has not taken her medication this morning. Patient states that since her last visit she had back surgery in October and that has been beneficial.   HISTORY 06/11/14: Ms. Cannella is a 63 year old female with a history of chronic migraines. She returns today for followup. The patient currently takes Topamax and states that she had to stop due to dry mouth. In the past for migraine she has tried Excedrin Migraine and is currently on a beta blocker with no relief. She has tried Botox for cosmetic reasons and notice that it was beneficial for her headaches. After receiving the Botox she was headache free for 4 months. Patient continues to have a daily headache. States that the headache is in the frontal region bilaterally. Sometimes feels nauseous but denies vomiting. Patient states that light makes it worse but denies phonophobia. Patient is having some dizziness but only when she sits up in bed in the morning, after a hour or so it will subside. She describes the sensation as the room spinning. When this happens she also feels faint. This does not occur every morning. So far it has happened approximately 3-4 times. She also states she got dizzy when kneeling down at her barn and then standing up immediately.  Patient does have chronic low back pain and is on several pain medications for that. No new medical issues since the last visit.   HISTORY 10/07/2013 (CW): Ms. Strubel is a 63 year old right-handed white female with a history of chronic daily headaches that she claims has been present virtually throughout her entire adult wife. The patient believes that there have been at least 40 years of daily headaches. The patient indicates that the headaches are mainly behind the eyes, and in the frontal areas with sharp pains at times. The patient has had at least 5 episodes in the past of left facial weakness, tunnel vision, and difficulty with cognitive processing lasting several hours. The patient has had several TIA workups that have been unremarkable. The patient does have a mild to moderate level of chronic white matter changes by MRI the brain. The last TIA workup occurred around 09/05/2013. The patient has chronic neck discomfort as well. The patient indicates that occasionally her hands will tingle, and she relates this to her neck issues. The patient also has chronic low back pain. The patient goes on to say that in the past, she had several injections several years ago with Botox on the face for cosmetic reasons. The patient noted that her headaches disappeared for almost 4 months. The patient currently takes Excedrin Migraine almost on a daily basis for her headaches. The patient denies any weakness of the extremities, or problems with control of the bowels or the bladder. The patient has some slight imbalance problems. The patient does have  photophobia and phonophobia with headache. The patient denies any nausea or vomiting with the headache. The patient is sent to this office for an evaluation. 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  REVIEW OF SYSTEMS: Out of a complete 14 system review  of symptoms, the patient complains only of the following symptoms, and all other reviewed systems are negative.  Joint pain   ALLERGIES: Allergies  Allergen Reactions  . Lyrica [Pregabalin] Other (See Comments)    DRY MOUTH  . Penicillins Swelling    Childhood reaction- tongue swelled    HOME MEDICATIONS: Outpatient Prescriptions Prior to Visit  Medication Sig Dispense Refill  . ARIPiprazole (ABILIFY) 5 MG tablet Take 5 mg by mouth daily.     Marland Kitchen aspirin EC 81 MG tablet Take 81 mg by mouth 2 (two) times daily.    Marland Kitchen co-enzyme Q-10 30 MG capsule Take 30 mg by mouth daily.    Marland Kitchen desvenlafaxine (PRISTIQ) 100 MG 24 hr tablet Take 100 mg by mouth daily.    . diazepam (VALIUM) 10 MG tablet Take 10 mg by mouth daily as needed for anxiety.    . fenofibrate (TRICOR) 48 MG tablet Take 1 tablet (48 mg total) by mouth daily. 30 tablet 11  . fluticasone (VERAMYST) 27.5 MCG/SPRAY nasal spray Place 2 sprays into both nostrils as needed for rhinitis or allergies.    . Ipratropium-Albuterol (COMBIVENT RESPIMAT) 20-100 MCG/ACT AERS respimat Inhale 2 puffs into the lungs every 6 (six) hours as needed for wheezing.    Javier Docker Oil 300 MG CAPS Take 300 mg by mouth daily.    Marland Kitchen lisinopril (PRINIVIL) 10 MG tablet Take 2 tablets (20 mg total) by mouth daily.    Marland Kitchen losartan (COZAAR) 100 MG tablet Take 100 mg by mouth daily.    . methocarbamol (ROBAXIN) 500 MG tablet Take 1 tablet (500 mg total) by mouth every 6 (six) hours as needed for muscle spasms. 60 tablet 1  . multivitamin-iron-minerals-folic acid (CENTRUM) chewable tablet Chew 1 tablet by mouth daily.    Marland Kitchen oxyCODONE-acetaminophen (PERCOCET/ROXICET) 5-325 MG per tablet Take 1-2 tablets by mouth every 4 (four) hours as needed for moderate pain. 90 tablet 0  . Pitavastatin Calcium (LIVALO) 4 MG TABS Take 4 mg by mouth daily.    . Tetrahydrozoline HCl (VISINE OP) Place 1 drop into both eyes daily as needed (dry eyes).    Marland Kitchen zolpidem (AMBIEN) 10 MG tablet Take 15  mg by mouth at bedtime as needed for sleep.    . metoprolol succinate (TOPROL-XL) 25 MG 24 hr tablet Take 25 mg by mouth every evening.     Facility-Administered Medications Prior to Visit  Medication Dose Route Frequency Provider Last Rate Last Dose  . botulinum toxin Type A (BOTOX) injection 200 Units  200 Units Intramuscular Once Hulen Luster, DO        PAST MEDICAL HISTORY: Past Medical History  Diagnosis Date  . TIA (transient ischemic attack) 09/2011  . Cerebrovascular disease     Carotid dopplers which showed no significant increase in velocities, extremely minimal on the left, antegrade vertebral bilaterally.  . 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 Hypertension, benign   . Hyperlipidemia with target LDL less than 100   . Headache(784.0) 10/07/2013  . Cervical spondylosis   .  Lumbosacral spondylosis   . Migraine headache   . History of stress test 03/2008    the post stress myocardial perfusion images show a normal pattern of perfusion in all regions. The post ventricle is normal in size. There is no scintigraphic evidence of inducible myocardial ichemia. There is prominent gut uptake activity noted in the infero-apical region.  . Arthritis   . Exercise-induced asthma with acute exacerbation     no recen exacerbation  . History of IBS     resovlved  . Heart murmur     little  . Sleep apnea     does not wear CPAP  . Pneumonia   . GERD (gastroesophageal reflux disease)   . Hx of multiple concussions     PAST SURGICAL HISTORY: Past Surgical History  Procedure Laterality Date  . 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 Foot fracture surgery      Multiple procedures, bilateral feet  . Left a.c. joint surgery  1990s  . Maximum access (mas)posterior lumbar interbody fusion (plif) 1 level N/A 09/30/2014    Procedure: FOR MAXIMUM ACCESS (MAS) POSTERIOR LUMBAR  INTERBODY FUSION (PLIF) 1 LEVEL;  Surgeon: Eustace Moore, MD;  Location: [facility];  Service: Neurosurgery;  Laterality: N/A;  FOR MAXIMUM ACCESS (MAS) POSTERIOR LUMBAR INTERBODY FUSION (PLIF) 1 LEVEL LUMBAR 4-5    FAMILY HISTORY: Family History  Problem Relation Age of Onset  . Stroke Father   . Heart attack Mother   . Stroke Mother     SOCIAL HISTORY: History   Social History  . Marital Status: Married    Spouse Name: [name]     Number of Children: 0  . Years of Education: COLLEGE   Occupational History  . Horse Breeder     Social History Main Topics  . Smoking status: Current Some Day Smoker    Last Attempt to Quit: 06/09/2010  . Smokeless tobacco: Never Used  . Alcohol Use: 0.0 oz/week    0 Not specified per week     Comment: ocassional wine  . Drug Use: No  . Sexual Activity: Not on file   Other Topics Concern  . Not on file   Social History Narrative   Patient lives at home with her husband [name]. Patient has no children.    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    Patient has BA degree.    Smokes 2-3 cigarettes /day -- former heavy smoker (failed "quitting") -- now says she has truly "QUIT"      PHYSICAL EXAM  Filed Vitals:   12/11/14 0947 12/11/14 0957  BP: 172/119 150/111  Pulse: 83 83  Height: 5\' 5"  (1.651 m)   Weight: 152 lb 12.8 oz (69.31 kg)    Body mass index is 25.43 kg/(m^2).  Generalized: Well developed, in no acute distress   Neurological examination  Mentation: Alert oriented to time, place, history taking. Follows all commands speech and language fluent Cranial nerve II-XII: Pupils were equal round reactive to light. Extraocular movements were full, visual field were full on confrontational test. Facial sensation and strength were normal. Uvula tongue midline. Head turning and shoulder shrug  were normal and symmetric. Motor: The motor testing reveals 5 over 5 strength of all 4 extremities. Good symmetric motor  tone is noted throughout.  Sensory: Sensory testing is intact to soft touch on all 4 extremities. No evidence of extinction is  noted.  Coordination: Cerebellar testing reveals good finger-nose-finger and heel-to-shin bilaterally.  Gait and station: Gait is normal. Tandem gait is slightly unsteady  Romberg is negative. No drift is seen.  Reflexes: Deep tendon reflexes are symmetric and normal bilaterally.      DIAGNOSTIC DATA (LABS, IMAGING, TESTING) - I reviewed patient records, labs, notes, testing and imaging myself where available.  Lab Results  Component Value Date   WBC 9.4 09/17/2014   HGB 16.0* 09/17/2014   HCT 45.2 09/17/2014   MCV 100.7* 09/17/2014   PLT 359 09/17/2014      Component Value Date/Time   NA 141 09/17/2014 1157   K 3.8 09/17/2014 1157   CL 99 09/17/2014 1157   CO2 25 09/17/2014 1157   GLUCOSE 94 09/17/2014 1157   BUN 10 09/17/2014 1157   CREATININE 0.70 09/17/2014 1157   CREATININE 0.84 06/23/2014 0935   CALCIUM 9.9 09/17/2014 1157   PROT 6.3 06/23/2014 0935   ALBUMIN 4.3 06/23/2014 0935   AST 30 06/23/2014 0935   ALT 34 06/23/2014 0935   ALKPHOS 73 06/23/2014 0935   BILITOT 0.5 06/23/2014 0935   GFRNONAA >90 09/17/2014 1157   GFRAA >90 09/17/2014 1157   Lab Results  Component Value Date   CHOL 265* 06/23/2014   HDL 66 06/23/2014   LDLCALC 142* 06/23/2014   TRIG 283* 06/23/2014   CHOLHDL 4.0 06/23/2014   Lab Results  Component Value Date   HGBA1C 5.6 09/05/2013   Lab Results  Component Value Date   VITAMINB12 293 09/22/2013   Lab Results  Component Value Date   TSH 0.735 06/11/2013      ASSESSMENT AND PLAN 63 y.o. year old female  has a past medical history of TIA (transient ischemic attack) (09/2011); Cerebrovascular disease; Cerebral aneurysm; Hypertension, benign; Hyperlipidemia with target LDL less than 100; Headache(784.0) (10/07/2013); Cervical spondylosis; Lumbosacral spondylosis; Migraine headache; History of stress test  (03/2008); Arthritis; Exercise-induced asthma with acute exacerbation; History of IBS; Heart murmur; Sleep apnea; Pneumonia; GERD (gastroesophageal reflux disease); and multiple concussions. here with:  1. Migraines  The patient's migraine has been controlled with Botox therapy. She will continue with Botox therapy-- her next injections are in January. If her headache frequency increase that she will let us know. She should consult with her primary care provider regarding fluctuations in her blood pressure. She will follow-up in 6 months or sooner if needed.  Ward Givens, MSN, NP-C 12/11/2014, 10:08 AM [facility] [address], [place], [place] [address] [phone]  Note: This document was prepared with digital dictation and possible smart phrase technology. Any transcriptional errors that result from this process are unintentional.

## 2015-01-22 ENCOUNTER — Ambulatory Visit (INDEPENDENT_AMBULATORY_CARE_PROVIDER_SITE_OTHER): Payer: BLUE CROSS/BLUE SHIELD | Admitting: Neurology

## 2015-01-22 VITALS — BP 161/101 | HR 89 | Ht 65.0 in | Wt 158.0 lb

## 2015-01-22 DIAGNOSIS — G43719 Chronic migraine without aura, intractable, without status migrainosus: Secondary | ICD-10-CM

## 2015-01-22 DIAGNOSIS — G43009 Migraine without aura, not intractable, without status migrainosus: Secondary | ICD-10-CM

## 2015-01-22 NOTE — Patient Instructions (Signed)
Consent Form Botulism Toxin Injection For Chronic Migraine  Botulism toxin has been approved by the Federal drug administration for treatment of chronic migraine. Botulism toxin does not cure chronic migraine and it may not be effective in some patients.  The administration of botulism toxin is accomplished by injecting a small amount of toxin into the muscles of the neck and head. Dosage must be titrated for each individual. Any benefits resulting from botulism toxin tend to wear off after 3 months with a repeat injection required if benefit is to be maintained. Injections are usually done every 3-4 months with maximum effect peak achieved by about 2 or 3 weeks. Botulism toxin is expensive and you should be sure of what costs you will incur resulting from the injection.  The side effects of botulism toxin use for chronic migraine may include:   -Transient, and usually mild, facial weakness with facial injections  -Transient, and usually mild, head or neck weakness with head/neck injections  -Reduction or loss of forehead facial animation due to forehead muscle weakness  -Eyelid drooping  -Dry eye  -Pain at the site of injection or bruising at the site of injection  -Double vision  -Potential unknown long term risks  Contraindications: You should not have Botox if you are pregnant, nursing, allergic to albumin, have an infection, skin condition, or muscle weakness at the site of the injection, or have myasthenia gravis, Lambert-Eaton syndrome, or ALS.  It is also possible that as with any injection, there may be an allergic reaction or no effect from the medication. Reduced effectiveness after repeated injections is sometimes seen and rarely infection at the injection site may occur. All care will be taken to prevent these side effects. If therapy is given over a long time, atrophy and wasting in the muscle injected may occur. Occasionally the patient's become refractory to treatment because they  develop antibodies to the toxin. In this event, therapy needs to be modified.

## 2015-01-22 NOTE — Progress Notes (Signed)
REASON FOR VISIT: Botox injections for migraine HISTORY FROM: patient  HISTORY OF PRESENT ILLNESS:  Brenda Carney is a 64 year old female with a history of chronic migraines. She returns today for follow-up. The patient is currently receiving Botox injections for her migraines.  She reports that the Botox has been beneficial. She states that she will occasionally take Excedrin migraine but not frequently. She reports having migraines every day before the shots. Now maybe 1-2 times a week. Less severe. Last less pretty short 30 minutes, takes an excedrin. Pain 3-4/10. +Light sensitivity, no nausea or sound sensitivity. The migraines are much less frequent and severe since starting botox.    HISTORY 06/11/14: Brenda Carney is a 64 year old female with a history of chronic migraines. She returns today for followup. The patient currently takes Topamax and states that she had to stop due to dry mouth. In the past for migraine she has tried Excedrin Migraine and is currently on a beta blocker with no relief. She has tried Botox for cosmetic reasons and notice that it was beneficial for her headaches. After receiving the Botox she was headache free for 4 months. Patient continues to have a daily headache. States that the headache is in the frontal region bilaterally. Sometimes feels nauseous but denies vomiting. Patient states that light makes it worse but denies phonophobia. Patient is having some dizziness but only when she sits up in bed in the morning, after a hour or so it will subside. She describes the sensation as the room spinning. When this happens she also feels faint. This does not occur every morning. So far it has happened approximately 3-4 times. She also states she got dizzy when kneeling down at her barn and then standing up immediately. Patient does have chronic low back pain and is on several pain medications for that. No new medical issues since the last visit.   HISTORY 10/07/2013 (CW): Brenda Carney  is a 64 year old right-handed white female with a history of chronic daily headaches that she claims has been present virtually throughout her entire adult wife. The patient believes that there have been at least 40 years of daily headaches. The patient indicates that the headaches are mainly behind the eyes, and in the frontal areas with sharp pains at times. The patient has had at least 5 episodes in the past of left facial weakness, tunnel vision, and difficulty with cognitive processing lasting several hours. The patient has had several TIA workups that have been unremarkable. The patient does have a mild to moderate level of chronic white matter changes by MRI the brain. The last TIA workup occurred around 09/05/2013. The patient has chronic neck discomfort as well. The patient indicates that occasionally her hands will tingle, and she relates this to her neck issues. The patient also has chronic low back pain. The patient goes on to say that in the past, she had several injections several years ago with Botox on the face for cosmetic reasons. The patient noted that her headaches disappeared for almost 4 months. The patient currently takes Excedrin Migraine almost on a daily basis for her headaches. The patient denies any weakness of the extremities, or problems with control of the bowels or the bladder. The patient has some slight imbalance problems. The patient does have photophobia and phonophobia with headache. The patient denies any nausea or vomiting with the headache. The patient is sent to this office for an evaluation. 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    Consent Form Botulism Toxin Injection For Chronic Migraine  Botulism toxin has been approved by the Federal drug administration for treatment of chronic migraine. Botulism toxin does not cure chronic migraine and it  may not be effective in some patients.  The administration of botulism toxin is accomplished by injecting a small amount of toxin into the muscles of the neck and head. Dosage must be titrated for each individual. Any benefits resulting from botulism toxin tend to wear off after 3 months with a repeat injection required if benefit is to be maintained. Injections are usually done every 3-4 months with maximum effect peak achieved by about 2 or 3 weeks. Botulism toxin is expensive and you should be sure of what costs you will incur resulting from the injection.  The side effects of botulism toxin use for chronic migraine may include:   -Transient, and usually mild, facial weakness with facial injections  -Transient, and usually mild, head or neck weakness with head/neck injections  -Reduction or loss of forehead facial animation due to forehead muscle weakness  -Eyelid drooping  -Dry eye  -Pain at the site of injection or bruising at the site of injection  -Double vision  -Potential unknown long term risks  Contraindications: You should not have Botox if you are pregnant, nursing, allergic to albumin, have an infection, skin condition, or muscle weakness at the site of the injection, or have myasthenia gravis, Lambert-Eaton syndrome, or ALS.  It is also possible that as with any injection, there may be an allergic reaction or no effect from the medication. Reduced effectiveness after repeated injections is sometimes seen and rarely infection at the injection site may occur. All care will be taken to prevent these side effects. If therapy is given over a long time, atrophy and wasting in the muscle injected may occur. Occasionally the patient's become refractory to treatment because they develop antibodies to the toxin. In this event, therapy needs to be modified.  I have read the above information and consent to the administration of botulism  toxin.    ______________  _____   _________________  Patient signature     Date   Witness signature      BOTOX PROCEDURE NOTE FOR MIGRAINE HEADACHE   HISTORY:   Contraindications and precautions discussed with patient. Aseptic procedure was observed and patient tolerated procedure. Procedure performed by Dr. Georgia Dom  The condition has existed for more than 6 months, and pt does not have a diagnosis of ALS, Myasthenia Gravis or Lambert-Eaton Syndrome. Risks and benefits of injections discussed and pt agrees to proceed with the procedure. Written consent obtained  These injections are medically necessary. He receives good benefits from these injections. These injections do not cause sedations or hallucinations which the oral therapies may cause.  Indication/Diagnosis: chronic migraine  Type of toxin: Botox  Lot # [id number] EXP 05/2017  Description of procedure:  The patient was placed in a sitting position. The standard protocol was used for Botox as follows, with 5 units of Botox injected at each site:   -Procerus muscle, midline injection  -Corrugator muscle, bilateral injection  -Frontalis muscle, bilateral injection, with 2 sites each side, medial injection was performed in the upper one third of the frontalis muscle, in the region vertical from the medial inferior edge of the superior orbital rim. The lateral injection was again in the upper one  third of the forehead vertically above the lateral limbus of the cornea, 1.5 cm lateral to the medial injection site.  -Temporalis muscle injection, 4 sites, bilaterally. The first injection was 3 cm above the tragus of the ear, second injection site was 1.5 cm to 3 cm up from the first injection site in line with the tragus of the ear. The third injection site was 1.5-3 cm forward between the first 2 injection sites. The fourth injection site was 1.5 cm posterior to the second injection site.  -Occipitalis muscle injection, 3  sites, bilaterally. The first injection was done one half way between the occipital protuberance and the tip of the mastoid process behind the ear. The second injection site was done lateral and superior to the first, 1 fingerbreadth from the first injection. The third injection site was 1 fingerbreadth superiorly and medially from the first injection site.  -Cervical paraspinal muscle injection, 2 sites, bilateral knee first injection site was 1 cm from the midline of the cervical spine, 3 cm inferior to the lower border of the occipital protuberance. The second injection site was 1.5 cm superiorly and laterally to the first injection site.  -Trapezius muscle injection was performed at 3 sites, bilaterally. The first injection site was in the upper trapezius muscle halfway between the inflection point of the neck, and the acromion. The second injection site was one half way between the acromion and the first injection site. The third injection was done between the first injection site and the inflection point of the neck.  Indication/Diagnosis: chronic migraine BOTOX(J0585) injection was performed according to protocol by [employer]. 200 units of BOTOX was dissolved into 4 cc NS.  NDC: [address]-45   Two 100 unit bottles of Botox was used, 155 units were injected, the rest of the Botox was wasted. The patient tolerated the procedure well, there were no complications of the above procedure.

## 2015-02-03 ENCOUNTER — Encounter: Payer: Self-pay | Admitting: Cardiology

## 2015-02-03 ENCOUNTER — Ambulatory Visit (INDEPENDENT_AMBULATORY_CARE_PROVIDER_SITE_OTHER): Payer: BLUE CROSS/BLUE SHIELD | Admitting: Cardiology

## 2015-02-03 VITALS — BP 160/102 | HR 73 | Ht 65.0 in | Wt 155.8 lb

## 2015-02-03 DIAGNOSIS — G458 Other transient cerebral ischemic attacks and related syndromes: Secondary | ICD-10-CM

## 2015-02-03 DIAGNOSIS — I10 Essential (primary) hypertension: Secondary | ICD-10-CM

## 2015-02-03 DIAGNOSIS — Z720 Tobacco use: Secondary | ICD-10-CM

## 2015-02-03 DIAGNOSIS — G459 Transient cerebral ischemic attack, unspecified: Secondary | ICD-10-CM

## 2015-02-03 DIAGNOSIS — I679 Cerebrovascular disease, unspecified: Secondary | ICD-10-CM

## 2015-02-03 DIAGNOSIS — E785 Hyperlipidemia, unspecified: Secondary | ICD-10-CM

## 2015-02-03 MED ORDER — AMLODIPINE BESYLATE 5 MG PO TABS: 5.0000 mg | ORAL_TABLET | Freq: Every day | ORAL | Status: DC

## 2015-02-03 NOTE — Patient Instructions (Addendum)
Sept 2016--Your physician has requested that you have a carotid duplex. This test is an ultrasound of the carotid arteries in your neck. It looks at blood flow through these arteries that supply the brain with blood. Allow one hour for this exam. There are no restrictions or special instructions.   Start amlodipine 5 mg daily    pharmacist will contact you in regards to [place] physician wants you to follow-up in sept /oct 2016. You will receive a reminder letter in the mail two months in advance. If you don't receive a letter, please call our office to schedule the follow-up appointment.

## 2015-02-05 ENCOUNTER — Encounter: Payer: Self-pay | Admitting: Cardiology

## 2015-02-05 NOTE — Assessment & Plan Note (Signed)
Counseled her - but not able to "kick habit"

## 2015-02-05 NOTE — Assessment & Plan Note (Signed)
No residual effects --> follow-up Carotid dopplers.

## 2015-02-05 NOTE — Progress Notes (Signed)
PATIENT: Brenda Carney MRN: [id number] DOB: Mar 12, 1951 PCP: Cloyd Stagers, MD  Clinic Note: Chief Complaint  Patient presents with  . 6 month visit    no chest pain , no sob, indigestion sometime,no edema- blood pressure elevated  . Hyperlipidemia    question about taking PCSK9 MEDICATION    HPI: Brenda Carney is a 64 y.o. female who was is a close personal friend & former patient of Dr. Rollene Fare (the interact with [facility]).   She started seeing Dr. Rollene Fare because of his significant family history of CAD. Her father had died in his 63s and severe PAD and multiple vascular surgeries and amputation starting an early age. She does not have any known coronary history herself. She had a Myoview 2009 which was negative..  She has a history of statin intolerance with hyperlipidemia, systemic hypertension and mild cerebral vascular disease status post TIA. 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  She actually tolerated Livalo, but commented that she did not see any appreciable change in her Lipid levels.  One of her friends had suggested PCSK-9 Inhibitors.  I last saw her, she had back and neck surgery by Dr. Sherley Bounds, and is now finally feeling well enough that she can go back out to walk her dog. She is now able to do at least 2-3 blocks without difficulty. She still smokes a cigarette or 2, but not consistently -- just cannot "fully kick the habit" & really isn't interested in discussing it.  Interval History:  From a cardiovascular standpoint, she seems to be doing relatively well with no recurrent TIA or amaurosis fugax symptoms. She has mild pain in her legs and walking but nothing that is limiting & at present cannot be sure if this is related to being a bit deconditioned from her Pre-op pain level.   She denies having any chest pain or shortness of breath with rest or exertion.  No CHF symptoms of PND, orthopnea or edema. No  palpitations, syncope/near syncope, or TIA/Amaurosis fugax.  Past Medical History  Diagnosis Date  . TIA (transient ischemic attack) 09/2011  . Cerebrovascular disease     Carotid dopplers which showed no significant increase in velocities, extremely minimal on the left, antegrade vertebral bilaterally.  . 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 Hypertension, benign   . Hyperlipidemia with target LDL less than 100   . Headache(784.0) 10/07/2013  . Cervical spondylosis   . Lumbosacral spondylosis   . Migraine headache   . History of stress test 03/2008    the post stress myocardial perfusion images show a normal pattern of perfusion in all regions. The post ventricle is normal in size. There is no scintigraphic evidence of inducible myocardial ichemia. There is prominent gut uptake activity noted in the infero-apical region.  . Arthritis   . Exercise-induced asthma with acute exacerbation     no recen exacerbation  . History of IBS     resovlved  . Heart murmur     Echo 08/2013: Suboptimal imaging. EF roughly 55%. Grade 1 diastolic dysfunction.  . Sleep apnea     does not wear CPAP  . Pneumonia   . GERD (gastroesophageal reflux disease)   . Hx of multiple concussions     Allergies  Allergen Reactions  . Lyrica [Pregabalin] Other (See Comments)    DRY MOUTH  . Penicillins Swelling  Childhood reaction- tongue swelled    Current Outpatient Prescriptions  Medication Sig Dispense Refill  . ARIPiprazole (ABILIFY) 5 MG tablet Take 5 mg by mouth daily.     Marland Kitchen aspirin EC 81 MG tablet Take 81 mg by mouth 2 (two) times daily.    Marland Kitchen desvenlafaxine (PRISTIQ) 100 MG 24 hr tablet Take 100 mg by mouth daily.    . diazepam (VALIUM) 10 MG tablet Take 10 mg by mouth daily as needed for anxiety.    . Ipratropium-Albuterol (COMBIVENT RESPIMAT) 20-100 MCG/ACT AERS respimat Inhale 2 puffs into the lungs every 6 (six) hours as needed  for wheezing.    Javier Docker Oil 300 MG CAPS Take 300 mg by mouth daily.    Marland Kitchen lisinopril-hydrochlorothiazide (PRINZIDE,ZESTORETIC) 20-12.5 MG per tablet Take 2 tablets by mouth daily.   0  . losartan (COZAAR) 100 MG tablet Take 100 mg by mouth daily.    . metoprolol succinate (TOPROL-XL) 100 MG 24 hr tablet Take 150 mg by mouth daily. Take with or immediately following a meal.    . multivitamin-iron-minerals-folic acid (CENTRUM) chewable tablet Chew 1 tablet by mouth daily.    Gean Birchwood ER 100 MG TB12   0  . zolpidem (AMBIEN) 10 MG tablet Take 15 mg by mouth at bedtime as needed for sleep.     Current Facility-Administered Medications  Medication Dose Route Frequency Provider Last Rate Last Dose  . botulinum toxin Type A (BOTOX) injection 200 Units  200 Units Intramuscular Once Hulen Luster, DO        History   Social History Narrative   Patient lives at home with her husband Brenda Carney. Patient has no children.    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    Patient has BA degree.    Smokes 2-3 cigarettes /day -- former heavy smoker (failed "quitting") -- now says she has truly "QUIT"    family history includes Heart attack in her mother; Stroke in her father and mother.  ROS: A comprehensive Review of Systems - was performed Review of Systems  Constitutional: Negative for malaise/fatigue.  HENT: Negative for nosebleeds.   Respiratory: Negative for shortness of breath and wheezing.   Cardiovascular: Negative for palpitations and claudication.  Gastrointestinal: Negative for blood in stool and melena.  Genitourinary: Negative for hematuria.  Musculoskeletal: Positive for neck pain (Much better post-op --> finally back to walking).  Neurological: Positive for dizziness (Still a little post-op dizziness - nothing notable.  ).  Endo/Heme/Allergies: Does not bruise/bleed easily.  Psychiatric/Behavioral: The patient is nervous/anxious.   All other systems reviewed and  are negative.  PHYSICAL EXAM BP 160/102 mmHg  Pulse 73  Ht 5\' 5"  (1.651 m)  Wt 155 lb 12.8 oz (70.67 kg)  BMI 25.93 kg/m2 General appearance: alert, cooperative, appears stated age, no distress and well-nourished, well-groomed. Healthy-appearing. Answers questions appropriately. Neck:  - supple (with well healed scar) no adenopathy, no carotid bruit and no JVD Lungs: clear to auscultation bilaterally, normal percussion bilaterally and nonlabored, good air movement Heart: RRR, normal S1 and S2. 1-2/6 SEM at LUSB. Otherwise no M./R./G. Nondisplaced PMI. Abdomen: soft, non-tender; bowel sounds normal; no masses,  no organomegaly Extremities: extremities normal, atraumatic, no cyanosis or edema and no edema, redness or tenderness in the calves or thighs Pulses: 2+ and symmetric Neurologic: Alert and oriented X 3, normal strength and tone. Normal symmetric reflexes. Normal coordination and gait   Adult ECG Report  Rate: 75 ;  Rhythm: normal sinus rhythm; Normal EKG  (borderline low voltage)    Recent Labs: none  ASSESSMENT / PLAN: Relatively stable from a CV standpoint.  Due for f/u Carotid Dopplers. Will address hyperlipidemia issue.  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 Recheck dopplers to ensure stability.   Essential hypertension Not adequately controlled today. The last several visits have shown that. I will start amlodipine 5mg  daily.  She will f/u with our pharmacist Tommy Medal for BP check & medication adjustment (on a strange regimen of ACE-I & ARB -- would like to cut out the ARB (not sure of the benefit from double Rx. Could also consider Carvedilol.   TIA (transient ischemic attack) No residual effects --> follow-up Carotid dopplers.   Tobacco abuse Counseled her - but not able to "kick habit"   Hyperlipidemia with target LDL less than 100 Lab Results  Component Value Date    CHOL 265* 06/23/2014   HDL 66 06/23/2014   LDLCALC 142* 06/23/2014   TRIG 283* 06/23/2014   CHOLHDL 4.0 06/23/2014   Poor control with Statin intolerance.  Refer to Tommy Medal RPH-CCP for consideration for PC SK 9 inhibitor     Orders Placed This Encounter  Procedures  . EKG 12-Lead  . Doppler carotid    Standing Status: Future     Number of Occurrences:      Standing Expiration Date: 02/03/2016    Order Specific Question:  Laterality    Answer:  Bilateral    Order Specific Question:  Where should this test be performed:    Answer:  [facility]   Meds ordered this encounter  Medications  . amLODipine (NORVASC) 5 MG tablet    Sig: Take 1 tablet (5 mg total) by mouth daily.    Dispense:  30 tablet    Refill:  11    Followup: 6 months with me.  Will be contacted by Tommy Medal, RPH-CCP   Leonie Man, M.D., M.S. Interventional Cardiologist   Pager # [phone]

## 2015-02-05 NOTE — Assessment & Plan Note (Addendum)
Not adequately controlled today. The last several visits have shown that. I will start amlodipine 5mg  daily.  She will f/u with our pharmacist Tommy Medal for BP check & medication adjustment (on a strange regimen of ACE-I & ARB -- would like to cut out the ARB (not sure of the benefit from double Rx. Could also consider Carvedilol.

## 2015-02-05 NOTE — Assessment & Plan Note (Signed)
Had mild abnormalities on Carotid Dopplers - last done to evaluate R Opthalmic Artery Aneurysm. Recheck dopplers to ensure stability.

## 2015-02-05 NOTE — Assessment & Plan Note (Signed)
Lab Results  Component Value Date   CHOL 265* 06/23/2014   HDL 66 06/23/2014   LDLCALC 142* 06/23/2014   TRIG 283* 06/23/2014   CHOLHDL 4.0 06/23/2014   Poor control with Statin intolerance.  Refer to Tommy Medal RPH-CCP for consideration for PC SK 9 inhibitor

## 2015-02-22 ENCOUNTER — Ambulatory Visit (INDEPENDENT_AMBULATORY_CARE_PROVIDER_SITE_OTHER): Payer: BLUE CROSS/BLUE SHIELD | Admitting: Pharmacist Clinician (PhC)/ Clinical Pharmacy Specialist

## 2015-02-22 VITALS — Ht 65.0 in | Wt 155.6 lb

## 2015-02-22 DIAGNOSIS — E785 Hyperlipidemia, unspecified: Secondary | ICD-10-CM

## 2015-02-22 NOTE — Patient Instructions (Signed)
Restart Livalo 2 mg daily.  After 4 weeks call office and we will get samples of the 4 mg.  After 2 weeks, start fenofibrate 1 tablet daily with food  Repeat labs in 2 months, office visit 1 week later

## 2015-02-23 ENCOUNTER — Encounter: Payer: Self-pay | Admitting: Pharmacist Clinician (PhC)/ Clinical Pharmacy Specialist

## 2015-02-23 MED ORDER — FENOFIBRATE 48 MG PO TABS: 48.0000 mg | ORAL_TABLET | Freq: Every day | ORAL | Status: DC

## 2015-02-23 NOTE — Progress Notes (Signed)
Agree with plan.   Look forward to results.  Brenda Carney

## 2015-02-23 NOTE — Assessment & Plan Note (Addendum)
She was doing well with Livalo, having an LDL of 89 back in 2014.  Will restart her today at 2 mg daily for 4 weeks then increase to 4 mg daily.  Samples of 2 mg were given, and i suggested she call when runs low, as we have 4 mg samples as well.  Advised her not to stop her medications unless directed by office.   After 2 weeks, to be sure she's not having side effects, she will add fenofibrate 48 mg.  Per her chart it was filled x 1 at her pharmacy, but patient made no mention of having taken.  Also encouraged patient to decrease number of eggs she eats each week, increase oatmeal and high fiber foods.  Also should continue to walk daily, increasing her time and distance regularly.  Repeat labs in 2 months and see me 1 week later

## 2015-02-23 NOTE — Progress Notes (Signed)
02/23/2015 Brenda Carney [birth year] [id number]   HPI:  Brenda Carney is a 64 y.o. female patient of Dr Ellyn Hack, who presents today for a lipid clinic evaluation.  Her cardiac history is significant for 5 TIAs in past 2 years.    RF:  HTN, smoking  Meds: krill oil 300 mg qd   Intolerant: statins - have caused muscle aches.  Did tolerate Livalo up to 4 mg, but has been off for several months  Family history: mother MI at 13, dad stroke at 85  Diet: usually eggs for breakfast, occasional oatmeal; rarely eats lunch, dinner usually chicken and vegetables.  Occasional baked potatoes or pasta; doesn't snack  Exercise:  Walks dog daily, works with horses regularly.  Previously walked more, but had back surgery in October and was unable to walk for several months prior.  Labs:   Results for Brenda Carney, Brenda Carney (MRN [id number]) as of 02/23/2015 15:26  Ref. Range 10/02/2011 06:05 06/11/2013 13:37 09/06/2013 06:15 06/23/2014 09:35  Cholesterol Latest Range: 0-200 mg/dL 209 (H) 232 (H) 213 (H) 265 (H)  Triglycerides Latest Range: <150 mg/dL 305 (H) 250 (H) 368 (H) 283 (H)  HDL Latest Range: >39 mg/dL 43 63 50 66  LDL (calc) Latest Range: 0-99 mg/dL 105 (H) 119 (H) 89 142 (H)  VLDL Latest Range: 0-40 mg/dL 61 (H) 50 (H) 74 (H) 57 (H)  Total CHOL/HDL Ratio Latest Units: Ratio 4.9 3.7 4.3 4.0     Current Outpatient Prescriptions  Medication Sig Dispense Refill  . amLODipine (NORVASC) 5 MG tablet Take 1 tablet (5 mg total) by mouth daily. 30 tablet 11  . ARIPiprazole (ABILIFY) 5 MG tablet Take 5 mg by mouth daily.     Marland Kitchen aspirin EC 81 MG tablet Take 81 mg by mouth 2 (two) times daily.    Marland Kitchen desvenlafaxine (PRISTIQ) 100 MG 24 hr tablet Take 100 mg by mouth daily.    . diazepam (VALIUM) 10 MG tablet Take 10 mg by mouth daily as needed for anxiety.    . fenofibrate (TRICOR) 48 MG tablet Take 1 tablet (48 mg total) by mouth daily. 30 tablet 5  . Ipratropium-Albuterol (COMBIVENT RESPIMAT) 20-100 MCG/ACT AERS  respimat Inhale 2 puffs into the lungs every 6 (six) hours as needed for wheezing.    Javier Docker Oil 300 MG CAPS Take 300 mg by mouth daily.    Marland Kitchen lisinopril-hydrochlorothiazide (PRINZIDE,ZESTORETIC) 20-12.5 MG per tablet Take 2 tablets by mouth daily.   0  . losartan (COZAAR) 100 MG tablet Take 100 mg by mouth daily.    . metoprolol succinate (TOPROL-XL) 100 MG 24 hr tablet Take 150 mg by mouth daily. Take with or immediately following a meal.    . multivitamin-iron-minerals-folic acid (CENTRUM) chewable tablet Chew 1 tablet by mouth daily.    Gean Birchwood ER 100 MG TB12   0  . zolpidem (AMBIEN) 10 MG tablet Take 15 mg by mouth at bedtime as needed for sleep.     Current Facility-Administered Medications  Medication Dose Route Frequency Provider Last Rate Last Dose  . botulinum toxin Type A (BOTOX) injection 200 Units  200 Units Intramuscular Once Hulen Luster, DO        Allergies  Allergen Reactions  . Lyrica [Pregabalin] Other (See Comments)    DRY MOUTH  . Penicillins Swelling    Childhood reaction- tongue swelled    Past Medical History  Diagnosis Date  . TIA (transient ischemic attack) 09/2011  . Cerebrovascular disease  Carotid dopplers which showed no significant increase in velocities, extremely minimal on the left, antegrade vertebral bilaterally.  . 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 Hypertension, benign   . Hyperlipidemia with target LDL less than 100   . Headache(784.0) 10/07/2013  . Cervical spondylosis   . Lumbosacral spondylosis   . Migraine headache   . History of stress test 03/2008    the post stress myocardial perfusion images show a normal pattern of perfusion in all regions. The post ventricle is normal in size. There is no scintigraphic evidence of inducible myocardial ichemia. There is prominent gut uptake activity noted in the infero-apical region.  . Arthritis   . Exercise-induced  asthma with acute exacerbation     no recen exacerbation  . History of IBS     resovlved  . Heart murmur     Echo 08/2013: Suboptimal imaging. EF roughly 55%. Grade 1 diastolic dysfunction.  . Sleep apnea     does not wear CPAP  . Pneumonia   . GERD (gastroesophageal reflux disease)   . Hx of multiple concussions     Height 5\' 5"  (1.651 m), weight 155 lb 9.6 oz (70.58 kg).   Tommy Medal PharmD CPP [facility]

## 2015-03-31 LAB — HEPATIC FUNCTION PANEL
ALT: 32 U/L (ref 0–35)
AST: 33 U/L (ref 0–37)
Albumin: 4.3 g/dL (ref 3.5–5.2)
Alkaline Phosphatase: 78 U/L (ref 39–117)
Bilirubin, Direct: 0.1 mg/dL (ref 0.0–0.3)
Indirect Bilirubin: 0.3 mg/dL (ref 0.2–1.2)
Total Bilirubin: 0.4 mg/dL (ref 0.2–1.2)
Total Protein: 7 g/dL (ref 6.0–8.3)

## 2015-03-31 LAB — LIPID PANEL
Cholesterol: 350 mg/dL — ABNORMAL HIGH (ref 0–200)
HDL: 35 mg/dL — ABNORMAL LOW (ref >=46)
Total CHOL/HDL Ratio: 10 Ratio
Triglycerides: 865 mg/dL — ABNORMAL HIGH (ref <150)

## 2015-03-31 LAB — LDL CHOLESTEROL, DIRECT: Direct LDL: 186 mg/dL — ABNORMAL HIGH

## 2015-04-23 ENCOUNTER — Ambulatory Visit (INDEPENDENT_AMBULATORY_CARE_PROVIDER_SITE_OTHER): Payer: BLUE CROSS/BLUE SHIELD | Admitting: Pharmacist Clinician (PhC)/ Clinical Pharmacy Specialist

## 2015-04-23 VITALS — Ht 65.0 in | Wt 156.2 lb

## 2015-04-23 DIAGNOSIS — E785 Hyperlipidemia, unspecified: Secondary | ICD-10-CM | POA: Diagnosis not present

## 2015-04-23 NOTE — Patient Instructions (Signed)
Restart the fenofibrate 48 mg once daily.  If you develop palpitations again, please stop and call us.  Start Livalo 4 mg once daily.  If you develop muscle aches, cut dose back to 2 mg (ok to cut in half)  Repeat labs in 2 months (end of June).  Please don't eat after 9pm the evening before.

## 2015-04-25 ENCOUNTER — Encounter: Payer: Self-pay | Admitting: Pharmacist Clinician (PhC)/ Clinical Pharmacy Specialist

## 2015-04-25 MED ORDER — PITAVASTATIN CALCIUM 4 MG PO TABS: 4.0000 mg | ORAL_TABLET | Freq: Every day | ORAL | Status: DC

## 2015-04-25 NOTE — Assessment & Plan Note (Addendum)
Brenda Carney was not fasting when she had her most recent labs drawn.  She had stopped both the fenofibrate and Livalo that were prescribed at her last visit.  Today will restart the Livalo at 4 mg daily.  Samples given and patinet told a prescription would be available at her pharmacy when those sample run out.  She was encouraged to restart the fenofibrate as well, and if the palpitations return then she can discontinue.  Stressed to her the need to keep her cholesterol levels lower to prevent future cardiac problems.  Will have her continue with this regimen for 2 months, then repeat labs, stressed that they be fasting.

## 2015-04-25 NOTE — Progress Notes (Signed)
04/25/2015 Brenda Carney 09-23-1951 [id number]   HPI:  Brenda Carney is a 64 y.o. female patient of Dr Ellyn Hack, who presents today for a lipid clinic evaluation.  Her cardiac history is significant for 5 TIAs in past 2 years.    RF:  HTN, smoking  Meds: krill oil 300 mg qd, at her last visit we started her on Livalo 2 mg, but when samples ran out she did not call for prescription   Intolerant: statins - have caused muscle aches.  Did tolerate Livalo up to 4 mg last year; was started on fenofibrate at last visit, took x 3 days, believed it caused her to have palpitations so discontinued  Family history: mother MI at 58, dad stroke at 32  Diet: usually eggs for breakfast, occasional oatmeal; rarely eats lunch, dinner usually chicken and vegetables.  Occasional baked potatoes or pasta; doesn't snack  Exercise:  Walks dog daily, works with horses regularly.  Previously walked more, but had back surgery in October and was unable to walk for several months prior.  Labs: NOTE:  Pt was NOT fasting for April 2016 labs  Results for Brenda Carney, Brenda Carney (MRN [id number]) as of 04/25/2015 08:51  Ref. Range 06/23/2014 09:35 03/30/2015 15:45  Cholesterol Latest Ref Range: 0-200 mg/dL 265 (H) 350 (H)  Triglycerides Latest Ref Range: <150 mg/dL 283 (H) 865 (H)  HDL Cholesterol Latest Ref Range: >=46 mg/dL 66 35 (L)  LDL (calc) Latest Ref Range: 0-99 mg/dL 142 (H) NOT CALC  Direct LDL Latest Units: mg/dL  186 (H)  VLDL Latest Ref Range: 0-40 mg/dL 57 (H) NOT CALC  Total CHOL/HDL Ratio Latest Units: Ratio 4.0 10.0      Current Outpatient Prescriptions  Medication Sig Dispense Refill  . amLODipine (NORVASC) 5 MG tablet Take 1 tablet (5 mg total) by mouth daily. 30 tablet 11  . ARIPiprazole (ABILIFY) 5 MG tablet Take 5 mg by mouth daily.     Marland Kitchen aspirin EC 81 MG tablet Take 81 mg by mouth 2 (two) times daily.    Marland Kitchen desvenlafaxine (PRISTIQ) 100 MG 24 hr tablet Take 100 mg by mouth daily.    . diazepam (VALIUM)  10 MG tablet Take 10 mg by mouth daily as needed for anxiety.    . fenofibrate (TRICOR) 48 MG tablet Take 1 tablet (48 mg total) by mouth daily. 30 tablet 5  . Ipratropium-Albuterol (COMBIVENT RESPIMAT) 20-100 MCG/ACT AERS respimat Inhale 2 puffs into the lungs every 6 (six) hours as needed for wheezing.    Javier Docker Oil 300 MG CAPS Take 300 mg by mouth daily.    Marland Kitchen lisinopril-hydrochlorothiazide (PRINZIDE,ZESTORETIC) 20-12.5 MG per tablet Take 2 tablets by mouth daily.   0  . losartan (COZAAR) 100 MG tablet Take 100 mg by mouth daily.    . metoprolol succinate (TOPROL-XL) 100 MG 24 hr tablet Take 150 mg by mouth daily. Take with or immediately following a meal.    . multivitamin-iron-minerals-folic acid (CENTRUM) chewable tablet Chew 1 tablet by mouth daily.    Gean Birchwood ER 100 MG TB12   0  . zolpidem (AMBIEN) 10 MG tablet Take 15 mg by mouth at bedtime as needed for sleep.     Current Facility-Administered Medications  Medication Dose Route Frequency Provider Last Rate Last Dose  . botulinum toxin Type A (BOTOX) injection 200 Units  200 Units Intramuscular Once Drema Dallas, DO        Allergies  Allergen Reactions  . Lyrica [Pregabalin]  Other (See Comments)    DRY MOUTH  . Penicillins Swelling    Childhood reaction- tongue swelled    Past Medical History  Diagnosis Date  . TIA (transient ischemic attack) 09/2011  . Cerebrovascular disease     Carotid dopplers which showed no significant increase in velocities, extremely minimal on the left, antegrade vertebral bilaterally.  . 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 Hypertension, benign   . Hyperlipidemia with target LDL less than 100   . Headache(784.0) 10/07/2013  . Cervical spondylosis   . Lumbosacral spondylosis   . Migraine headache   . History of stress test 03/2008    the post stress myocardial perfusion images show a normal pattern of perfusion in all  regions. The post ventricle is normal in size. There is no scintigraphic evidence of inducible myocardial ichemia. There is prominent gut uptake activity noted in the infero-apical region.  . Arthritis   . Exercise-induced asthma with acute exacerbation     no recen exacerbation  . History of IBS     resovlved  . Heart murmur     Echo 08/2013: Suboptimal imaging. EF roughly 55%. Grade 1 diastolic dysfunction.  . Sleep apnea     does not wear CPAP  . Pneumonia   . GERD (gastroesophageal reflux disease)   . Hx of multiple concussions     Height 5\' 5"  (1.651 m), weight 156 lb 3.2 oz (70.852 kg).   Tommy Medal PharmD CPP [facility]

## 2015-04-27 ENCOUNTER — Ambulatory Visit: Payer: Self-pay | Admitting: Neurology

## 2015-05-26 ENCOUNTER — Other Ambulatory Visit: Payer: Self-pay | Admitting: Anesthesiology

## 2015-05-26 ENCOUNTER — Ambulatory Visit
Admission: RE | Admit: 2015-05-26 | Discharge: 2015-05-26 | Disposition: A | Discharge status: Home / Self Care | Payer: BLUE CROSS/BLUE SHIELD | Source: Ambulatory Visit | Attending: Anesthesiology | Admitting: Anesthesiology

## 2015-05-26 DIAGNOSIS — M25551 Pain in right hip: Secondary | ICD-10-CM

## 2015-06-03 ENCOUNTER — Ambulatory Visit: Payer: BLUE CROSS/BLUE SHIELD | Admitting: Neurology

## 2015-06-21 ENCOUNTER — Telehealth: Payer: Self-pay

## 2015-06-21 NOTE — Telephone Encounter (Signed)
Spoke to patient who says she will not be receiving botox b/c it is to painful. Patient will come to pick up medication.

## 2015-06-24 ENCOUNTER — Encounter: Payer: Self-pay | Admitting: Adult Health

## 2015-06-24 ENCOUNTER — Ambulatory Visit (INDEPENDENT_AMBULATORY_CARE_PROVIDER_SITE_OTHER): Payer: BLUE CROSS/BLUE SHIELD | Admitting: Adult Health

## 2015-06-24 VITALS — BP 89/52 | HR 84 | Ht 65.0 in | Wt 153.0 lb

## 2015-06-24 DIAGNOSIS — G43009 Migraine without aura, not intractable, without status migrainosus: Secondary | ICD-10-CM

## 2015-06-24 NOTE — Progress Notes (Signed)
PATIENT: Brenda Carney DOB: Nov 13, 1951  REASON FOR VISIT: follow up- migraine headaches  HISTORY FROM: patient  HISTORY OF PRESENT ILLNESS: Brenda Carney is a 64 year old female with a history of chronic migraines. She returns today for follow-up. She receives Botox injections for her migraines. Her last injection was in Carney 2016. She reports that she did not have her Botox injections in April because the last injections distorted her face. She states that her eyebrows were distorted for months. She does state that her migraines have resolved with Botox. Since starting Botox she has not had a single migraine. Even though she is not had this last round of injections she is not experienced a migraine. The patient had back surgery in October and states that her pain has resolved and her back. She states that she may need an additional surgery later on. The patient also states that she got new glasses and states that may have helped her migraine frequency as well. She returns today for an evaluation.  HISTORY 12/11/14: Brenda Carney is a 64 year old female with a history of chronic migraines. She returns today for follow-up. The patient is currently receiving Botox injections for her migraines. Her last injection was in October. She reports that the Botox has been beneficial. She states that she has only had one migraine since her Botox injection in October. She states that she will occasionally take Excedrin migraine but not frequently. Patient states that she did see her PCP for her BP. She states that they did adjust her medications but her BP will still run higher at times. She reports that she has not taken her medication this morning. Patient states that since her last visit she had back surgery in October and that has been beneficial.   HISTORY 06/11/14: Brenda Carney is a 64 year old female with a history of chronic migraines. She returns today for followup. The patient currently takes Topamax and  states that she had to stop due to dry mouth. In the past for migraine she has tried Excedrin Migraine and is currently on a beta blocker with no relief. She has tried Botox for cosmetic reasons and notice that it was beneficial for her headaches. After receiving the Botox she was headache free for 4 months. Patient continues to have a daily headache. States that the headache is in the frontal region bilaterally. Sometimes feels nauseous but denies vomiting. Patient states that light makes it worse but denies phonophobia. Patient is having some dizziness but only when she sits up in bed in the morning, after a hour or so it will subside. She describes the sensation as the room spinning. When this happens she also feels faint. This does not occur every morning. So far it has happened approximately 3-4 times. She also states she got dizzy when kneeling down at her barn and then standing up immediately. Patient does have chronic low back pain and is on several pain medications for that. No new medical issues since the last visit.   HISTORY 10/07/2013 (CW): Brenda Carney is a 64 year old right-handed white female with a history of chronic daily headaches that she claims has been present virtually throughout her entire adult wife. The patient believes that there have been at least 40 years of daily headaches. The patient indicates that the headaches are mainly behind the eyes, and in the frontal areas with sharp pains at times. The patient has had at least 5 episodes in the past of left facial weakness, tunnel vision,  and difficulty with cognitive processing lasting several hours. The patient has had several TIA workups that have been unremarkable. The patient does have a mild to moderate level of chronic white matter changes by MRI the brain. The last TIA workup occurred around 09/05/2013. The patient has chronic neck discomfort as well. The patient indicates that occasionally her hands will tingle, and she relates this  to her neck issues. The patient also has chronic low back pain. The patient goes on to say that in the past, she had several injections several years ago with Botox on the face for cosmetic reasons. The patient noted that her headaches disappeared for almost 4 months. The patient currently takes Excedrin Migraine almost on a daily basis for her headaches. The patient denies any weakness of the extremities, or problems with control of the bowels or the bladder. The patient has some slight imbalance problems. The patient does have photophobia and phonophobia with headache. The patient denies any nausea or vomiting with the headache. The patient is sent to this office for an evaluation. 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  See HPI  ALLERGIES: Allergies  Allergen Reactions  . Lyrica [Pregabalin] Other (See Comments)    DRY MOUTH  . Penicillins Swelling    Childhood reaction- tongue swelled    HOME MEDICATIONS: Outpatient Prescriptions Prior to Visit  Medication Sig Dispense Refill  . amLODipine (NORVASC) 5 MG tablet Take 1 tablet (5 mg total) by mouth daily. 30 tablet 11  . ARIPiprazole (ABILIFY) 5 MG tablet Take 5 mg by mouth daily.     Marland Kitchen aspirin EC 81 MG tablet Take 81 mg by mouth 2 (two) times daily.    Marland Kitchen desvenlafaxine (PRISTIQ) 100 MG 24 hr tablet Take 100 mg by mouth daily.    . diazepam (VALIUM) 10 MG tablet Take 10 mg by mouth daily as needed for anxiety.    . fenofibrate (TRICOR) 48 MG tablet Take 1 tablet (48 mg total) by mouth daily. 30 tablet 5  . Ipratropium-Albuterol (COMBIVENT RESPIMAT) 20-100 MCG/ACT AERS respimat Inhale 2 puffs into the lungs every 6 (six) hours as needed for wheezing.      Javier Docker Oil 300 MG CAPS Take 300 mg by mouth daily.    Marland Kitchen lisinopril-hydrochlorothiazide (PRINZIDE,ZESTORETIC) 20-12.5 MG per tablet Take 2 tablets by mouth daily.   0  . losartan (COZAAR) 100 MG tablet Take 100 mg by mouth daily.    . metoprolol succinate (TOPROL-XL) 100 MG 24 hr tablet Take 150 mg by mouth daily. Take with or immediately following a meal.    . multivitamin-iron-minerals-folic acid (CENTRUM) chewable tablet Chew 1 tablet by mouth daily.    Gean Birchwood ER 100 MG TB12   0  . Pitavastatin Calcium 4 MG TABS Take 1 tablet (4 mg total) by mouth daily. 30 tablet 5  . zolpidem (AMBIEN) 10 MG tablet Take 15 mg by mouth at bedtime as needed for sleep.     Facility-Administered Medications Prior to Visit  Medication Dose Route Frequency Provider Last Rate Last Dose  . botulinum toxin Type A (BOTOX) injection 200 Units  200 Units Intramuscular Once Drema Dallas, DO  PAST MEDICAL HISTORY: Past Medical History  Diagnosis Date  . TIA (transient ischemic attack) 09/2011  . Cerebrovascular disease     Carotid dopplers which showed no significant increase in velocities, extremely minimal on the left, antegrade vertebral bilaterally.  . 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 Hypertension, benign   . Hyperlipidemia with target LDL less than 100   . Headache(784.0) 10/07/2013  . Cervical spondylosis   . Lumbosacral spondylosis   . Migraine headache   . History of stress test 03/2008    the post stress myocardial perfusion images show a normal pattern of perfusion in all regions. The post ventricle is normal in size. There is no scintigraphic evidence of inducible myocardial ichemia. There is prominent gut uptake activity noted in the infero-apical region.  . Arthritis   . Exercise-induced asthma with acute exacerbation     no recen exacerbation  . History of IBS     resovlved  . Heart murmur     Echo 08/2013:  Suboptimal imaging. EF roughly 55%. Grade 1 diastolic dysfunction.  . Sleep apnea     does not wear CPAP  . Pneumonia   . GERD (gastroesophageal reflux disease)   . Hx of multiple concussions     PAST SURGICAL HISTORY: Past Surgical History  Procedure Laterality Date  . 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 Foot fracture surgery      Multiple procedures, bilateral feet  . Left a.c. joint surgery  1990s  . Maximum access (mas)posterior lumbar interbody fusion (plif) 1 level N/A 09/30/2014    Procedure: FOR MAXIMUM ACCESS (MAS) POSTERIOR LUMBAR INTERBODY FUSION (PLIF) 1 LEVEL;  Surgeon: Eustace Moore, MD;  Location: [facility];  Service: Neurosurgery;  Laterality: N/A;  FOR MAXIMUM ACCESS (MAS) POSTERIOR LUMBAR INTERBODY FUSION (PLIF) 1 LEVEL LUMBAR 4-5    FAMILY HISTORY: Family History  Problem Relation Age of Onset  . Stroke Father   . Heart attack Mother   . Stroke Mother     SOCIAL HISTORY: History   Social History  . Marital Status: Married    Spouse Name: [name]   . Number of Children: 0  . Years of Education: COLLEGE   Occupational History  . Horse Breeder     Social History Main Topics  . Smoking status: Current Some Day Smoker    Last Attempt to Quit: 06/09/2010  . Smokeless tobacco: Never Used  . Alcohol Use: 0.0 oz/week    0 Standard drinks or equivalent per week     Comment: ocassional wine  . Drug Use: No  . Sexual Activity: Not on file   Other Topics Concern  . Not on file   Social History Narrative   Patient lives at home with her husband [name]. Patient has no children.    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    Patient has BA degree.    Smokes 2-3 cigarettes /day -- former heavy smoker (failed "quitting") -- now says she has truly "QUIT"      PHYSICAL EXAM  Filed Vitals:   06/24/15 1437  BP: 89/52  Pulse: 84  Height: 5\' 5"  (1.651 m)  Weight: 153 lb (69.4 kg)   Body mass  index is 25.46 kg/(m^2).  Generalized: Well developed, in no acute distress   Neurological examination  Mentation: Alert oriented to time,  place, history taking. Follows all commands speech and language fluent Cranial nerve II-XII: Pupils were equal round reactive to light. Extraocular movements were full, visual field were full on confrontational test. Facial sensation and strength were normal. Uvula tongue midline. Head turning and shoulder shrug  were normal and symmetric. Motor: The motor testing reveals 5 over 5 strength of all 4 extremities. Good symmetric motor tone is noted throughout.  Sensory: Sensory testing is intact to soft touch on all 4 extremities. No evidence of extinction is noted.  Coordination: Cerebellar testing reveals good finger-nose-finger and heel-to-shin bilaterally.  Gait and station: Gait is normal. Tandem gait is normal. Romberg is negative. No drift is seen.  Reflexes: Deep tendon reflexes are symmetric and normal bilaterally.    DIAGNOSTIC DATA (LABS, IMAGING, TESTING) - I reviewed patient records, labs, notes, testing and imaging myself where available.      ASSESSMENT AND PLAN 64 y.o. year old female  has a past medical history of TIA (transient ischemic attack) (09/2011); Cerebrovascular disease; Cerebral aneurysm; Hypertension, benign; Hyperlipidemia with target LDL less than 100; Headache(784.0) (10/07/2013); Cervical spondylosis; Lumbosacral spondylosis; Migraine headache; History of stress test (03/2008); Arthritis; Exercise-induced asthma with acute exacerbation; History of IBS; Heart murmur; Sleep apnea; Pneumonia; GERD (gastroesophageal reflux disease); and multiple concussions. here with:  1. Migraine headaches  Overall the patient is doing well. She has not experienced a migraine since doing Botox injections. She did not get her round of Botox in April and has not experienced any migraines. She wants to hold off on any future Botox injections for  now.  Patient advised that if she begins to have migraine headaches again she should let us know. Otherwise she'll follow-up in 6 months or sooner if needed.  Ward Givens, MSN, NP-C 06/24/2015, 2:38 PM [facility] [address], [place], [place] [address] [phone]  Note: This document was prepared with digital dictation and possible smart phrase technology. Any transcriptional errors that result from this process are unintentional.

## 2015-06-24 NOTE — Progress Notes (Signed)
I have read the note, and I agree with the clinical assessment and plan.  Turon Kilmer KEITH   

## 2015-06-24 NOTE — Patient Instructions (Signed)
If headaches return please let us know  

## 2015-08-09 ENCOUNTER — Encounter: Payer: Self-pay | Admitting: *Deleted

## 2015-08-09 NOTE — Progress Notes (Signed)
Faxed signed botox form to [facility] at [phone]. Received fax confirmation.

## 2015-09-16 ENCOUNTER — Encounter: Payer: Self-pay | Admitting: Cardiology

## 2015-09-16 ENCOUNTER — Ambulatory Visit (INDEPENDENT_AMBULATORY_CARE_PROVIDER_SITE_OTHER): Payer: BLUE CROSS/BLUE SHIELD | Admitting: Cardiology

## 2015-09-16 VITALS — BP 115/78 | HR 73 | Ht 65.0 in | Wt 150.1 lb

## 2015-09-16 DIAGNOSIS — G459 Transient cerebral ischemic attack, unspecified: Secondary | ICD-10-CM

## 2015-09-16 DIAGNOSIS — E785 Hyperlipidemia, unspecified: Secondary | ICD-10-CM | POA: Diagnosis not present

## 2015-09-16 DIAGNOSIS — I10 Essential (primary) hypertension: Secondary | ICD-10-CM | POA: Diagnosis not present

## 2015-09-16 NOTE — Progress Notes (Signed)
PCP: Shamleffer, Herschell Dimes, MD  Clinic Note: Chief Complaint  Patient presents with  . Follow-up    no issues  . Hypertension    HPI: Brenda Carney is a 64 y.o. female 2with a PMH below who presents today for essentially six-month followup for CAD risk factors.   She is is a close personal friend & former patient of Dr. Rollene Fare (the interact with [employer]). She started seeing Dr. Rollene Fare because of his significant family history of CAD. Her father had died in his 40s and severe PAD and multiple vascular surgeries and amputation starting an early age. She does not have any known coronary history herself. She had a Myoview 2009 which was negative. She has a history of statin intolerance with known hyperlipidemia, hypertension and mild cerebrovascular disease status post TIA. During her last visit, I referred her to Tommy Medal, [facility] for evaluation of lipid management options. -- 5 TIAs, PMH with Mother MI @ 59.    Brenda Carney was last seen on 02/03/2015 - for her routine six-month followup visit here she is doing well at that time. She was asking questions about what the control agents.  Recent Hospitalizations: none  Studies Reviewed: not fasting. Off of fenofibrate & Livalo --> restarted @ 4 mg Livalo & encouraged to restart fenofibrate. -- Plan was to recheck lipids in June (not yet checked.) Lab Results  Component Value Date   CHOL 350* 03/30/2015   HDL 35* 03/30/2015   LDLCALC NOT CALC 03/30/2015   LDLDIRECT 186* 03/30/2015   TRIG 865* 03/30/2015   CHOLHDL 10.0 03/30/2015    Interval History: she presents today really doing relatively well with the only complaint is she notices that she has had some symptoms that would be consistent with orthostatic dizziness. No syncope or near syncope.  No chest pain or shortness of breath with rest or exertion.  No PND, orthopnea or edema.  No palpitations, lightheadedness, dizziness, weakness. No TIA/amaurosis  fugax symptoms. No melena, hematochezia, hematuria, or epstaxis. No claudication.   Past Medical History  Diagnosis Date  . TIA (transient ischemic attack) 09/2011  . Cerebrovascular disease     Carotid dopplers which showed no significant increase in velocities, extremely minimal on the left, antegrade vertebral bilaterally.  . 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 Hypertension, benign   . Hyperlipidemia with target LDL less than 100   . Headache(784.0) 10/07/2013  . Cervical spondylosis   . Lumbosacral spondylosis   . Migraine headache   . History of stress test 03/2008    the post stress myocardial perfusion images show a normal pattern of perfusion in all regions. The post ventricle is normal in size. There is no scintigraphic evidence of inducible myocardial ichemia. There is prominent gut uptake activity noted in the infero-apical region.  . Arthritis   . Exercise-induced asthma with acute exacerbation     no recen exacerbation  . History of IBS     resovlved  . Heart murmur     Echo 08/2013: Suboptimal imaging. EF roughly 55%. Grade 1 diastolic dysfunction.  . Sleep apnea     does not wear CPAP  . Pneumonia   . GERD (gastroesophageal reflux disease)   . Hx of multiple concussions     Past Surgical History  Procedure Laterality Date  . 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 Foot  fracture surgery      Multiple procedures, bilateral feet  . Left a.c. joint surgery  1990s  . Maximum access (mas)posterior lumbar interbody fusion (plif) 1 level N/A 09/30/2014    Procedure: FOR MAXIMUM ACCESS (MAS) POSTERIOR LUMBAR INTERBODY FUSION (PLIF) 1 LEVEL;  Surgeon: Eustace Moore, MD;  Location: [facility];  Service: Neurosurgery;  Laterality: N/A;  FOR MAXIMUM ACCESS (MAS) POSTERIOR LUMBAR INTERBODY FUSION (PLIF) 1 LEVEL LUMBAR 4-5    ROS: A comprehensive was  performed. Review of Systems  Gastrointestinal: Negative for constipation and blood in stool.  Genitourinary: Negative for hematuria.  Musculoskeletal: Negative for myalgias.  Neurological: Positive for dizziness.       No further TIA or amaurosis fugax symptoms  All other systems reviewed and are negative.   Prior to Admission medications   Medication Sig Start Date End Date Taking? Authorizing Provider  amLODipine (NORVASC) 5 MG tablet Take 1 tablet (5 mg total) by mouth daily. 02/03/15  Yes Leonie Man, MD  ARIPiprazole (ABILIFY) 5 MG tablet Take 5 mg by mouth daily.    Yes Historical Provider, MD  aspirin EC 81 MG tablet Take 81 mg by mouth 2 (two) times daily.   Yes Historical Provider, MD  desvenlafaxine (PRISTIQ) 100 MG 24 hr tablet Take 100 mg by mouth daily.   Yes Historical Provider, MD  diazepam (VALIUM) 10 MG tablet Take 10 mg by mouth daily as needed for anxiety.   Yes Historical Provider, MD  fenofibrate (TRICOR) 145 MG tablet Take 145 mg by mouth daily.  08/23/15  Yes Historical Provider, MD  Ipratropium-Albuterol (COMBIVENT RESPIMAT) 20-100 MCG/ACT AERS respimat Inhale 2 puffs into the lungs every 6 (six) hours as needed for wheezing.   Yes Historical Provider, MD  Javier Docker Oil 300 MG CAPS Take 300 mg by mouth daily.   Yes Historical Provider, MD  lisinopril-hydrochlorothiazide (PRINZIDE,ZESTORETIC) 20-12.5 MG per tablet Take 2 tablets by mouth daily.  11/23/14  Yes Historical Provider, MD  losartan (COZAAR) 100 MG tablet Take 100 mg by mouth daily.   Yes Historical Provider, MD  metoprolol succinate (TOPROL-XL) 50 MG 24 hr tablet Take 50 mg by mouth daily. 09/07/15  Yes Historical Provider, MD  multivitamin-iron-minerals-folic acid (CENTRUM) chewable tablet Chew 1 tablet by mouth daily.   Yes Historical Provider, MD  NUCYNTA 75 MG TABS Take 75 mg by mouth daily as needed (one tablet every 12 hours as need for back pain).  08/10/15  Yes Historical Provider, MD  Pitavastatin  Calcium 4 MG TABS Take 1 tablet (4 mg total) by mouth daily. 04/25/15  Yes Leonie Man, MD  zolpidem (AMBIEN) 10 MG tablet Take 15 mg by mouth at bedtime as needed for sleep (May take one and half tablets by mouth nightly as needed for sleep).    Yes Historical Provider, MD   Allergies  Allergen Reactions  . Lyrica [Pregabalin] Other (See Comments)    DRY MOUTH  . Penicillins Swelling    Childhood reaction- tongue swelled    Social History   Social History  . Marital Status: Married    Spouse Name: [name]   . Number of Children: 0  . Years of Education: COLLEGE   Occupational History  . Horse Breeder     Social History Main Topics  . Smoking status: Current Some Day Smoker    Last Attempt to Quit: 06/09/2010  . Smokeless tobacco: Never Used  . Alcohol Use: 0.0 oz/week    0 Standard drinks  or equivalent per week     Comment: ocassional wine  . Drug Use: No  . Sexual Activity: Not Asked   Other Topics Concern  . None   Social History Narrative   Patient lives at home with her husband [name]. Patient has no children.    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    Patient has BA degree.    Smokes 2-3 cigarettes /day -- former heavy smoker (failed "quitting") -- now says she has truly "QUIT"   Family History  Problem Relation Age of Onset  . Stroke Father   . Heart attack Mother   . Stroke Mother     Wt Readings from Last 3 Encounters:  09/16/15 150 lb 1.6 oz (68.085 kg)  06/24/15 153 lb (69.4 kg)  04/25/15 156 lb 3.2 oz (70.852 kg)    PHYSICAL EXAM BP 115/78 mmHg  Pulse 73  Ht 5\' 5"  (1.651 m)  Wt 150 lb 1.6 oz (68.085 kg)  BMI 24.98 kg/m2 General appearance: alert, cooperative, appears stated age, no distress and well-nourished, well-groomed. Healthy-appearing. Answers questions appropriately. Neck: - supple (with well healed scar) no adenopathy, no carotid bruit and no JVD Lungs: clear to auscultation bilaterally, normal percussion  bilaterally and nonlabored, good air movement Heart: RRR, normal S1 and S2. 1-2/6 SEM at LUSB. Otherwise no M./R./G. Nondisplaced PMI. Abdomen: soft, non-tender; bowel sounds normal; no masses, no organomegaly Extremities: extremities normal, atraumatic, no cyanosis or edema and no edema, redness or tenderness in the calves or thighs Pulses: 2+ and symmetric Neurologic: Alert and oriented X 3, normal strength and tone. Normal symmetric reflexes. Normal coordination and gait   Adult ECG Report  Rate: 73 ;  Rhythm: normal sinus rhythm; normal axis, intervals and durations.  Narrative Interpretation: normal EKG   Other studies Reviewed: Additional studies/ records that were reviewed today include: none  ASSESSMENT / PLAN: Problem List Items Addressed This Visit    Essential hypertension    During her last visit, we started amlodipine 5 mg daily. She still has listed both losartan and combination lisinopril/HCTZ. Her blood pressure actually is much improved today, but I really question being on this combination. Plan is to try to wean off the losartan. We'll start reducing to 50 mg daily and continue current medications. She was on a reduced dose of metoprolol.  She'll return for blood pressure check in one month with a nurse. If her blood pressure remained stable, the plan will be to continue this losartan and completely discontinued the medication. If her blood pressure which increased after that, I would simply increase the amlodipine dose. Does not lobe with exertional fatigue and his haptoglobin was cut back from 200 mg daily to 75 mg daily. Suggested he had taken his medicine, likely recommend taking it in the evening prior to going to bed to help out with helping her go to sleep      Relevant Medications   metoprolol succinate (TOPROL-XL) 50 MG 24 hr tablet   fenofibrate (TRICOR) 145 MG tablet   Other Relevant Orders   EKG 12-Lead (Completed)   Hyperlipidemia with target LDL less  than 100 - Primary (Chronic)    Her PCP is checking her labs, and I have not seen him in recent values 4 followup since April. The last several we have checked were nonfasting, and it is helpful. She was restarted on the below 4 mg. She should have had labs checked soon. If not I would  like to see labs per PCP.      Relevant Medications   metoprolol succinate (TOPROL-XL) 50 MG 24 hr tablet   fenofibrate (TRICOR) 145 MG tablet   Other Relevant Orders   EKG 12-Lead (Completed)   TIA (transient ischemic attack)    Several TIA type symptoms. Important to continue adequate blood pressure control. She is now taking fenofibrate and is back on Livalo.       Relevant Medications   metoprolol succinate (TOPROL-XL) 50 MG 24 hr tablet   fenofibrate (TRICOR) 145 MG tablet      Current medicines are reviewed at length with the patient today. (+/- concerns) none The following changes have been made:   DECREASE LOSARTAN 50 MG  FROM 100 MG (1/2 TABLET)  TAKE METOPROLOL AT BEDTIME   CONTINUE TO RECORD BLOOD PRESSURES  Your physician recommends that you schedule a follow-up appointment in [place]  Your physician recommends that you schedule a follow-up appointment in [place].   Studies Ordered:   Orders Placed This Encounter  Procedures  . EKG 12-Lead     Leonie Man, M.D., M.S. Interventional Cardiologist   Pager # [phone]

## 2015-09-16 NOTE — Patient Instructions (Signed)
DECREASE LOSARTAN 50 MG  FROM 100 MG (1/2 TABLET)  TAKE METOPROLOL AT BEDTIME   CONTINUE TO RECORD BLOOD PRESSURES  Your physician recommends that you schedule a follow-up appointment in [place] VISIT  Your physician recommends that you schedule a follow-up appointment in [place].

## 2015-09-18 ENCOUNTER — Encounter: Payer: Self-pay | Admitting: Cardiology

## 2015-09-18 NOTE — Assessment & Plan Note (Addendum)
During her last visit, we started amlodipine 5 mg daily. She still has listed both losartan and combination lisinopril/HCTZ. Her blood pressure actually is much improved today, but I really question being on this combination. Plan is to try to wean off the losartan. We'll start reducing to 50 mg daily and continue current medications. She was on a reduced dose of metoprolol.  She'll return for blood pressure check in one month with a nurse. If her blood pressure remained stable, the plan will be to continue this losartan and completely discontinued the medication. If her blood pressure which increased after that, I would simply increase the amlodipine dose. Does not lobe with exertional fatigue and his haptoglobin was cut back from 200 mg daily to 75 mg daily. Suggested he had taken his medicine, likely recommend taking it in the evening prior to going to bed to help out with helping her go to sleep

## 2015-09-18 NOTE — Assessment & Plan Note (Addendum)
Several TIA type symptoms. Important to continue adequate blood pressure control. She is now taking fenofibrate and is back on Livalo.

## 2015-09-18 NOTE — Assessment & Plan Note (Signed)
Her PCP is checking her labs, and I have not seen him in recent values 4 followup since April. The last several we have checked were nonfasting, and it is helpful. She was restarted on the below 4 mg. She should have had labs checked soon. If not I would like to see labs per PCP.

## 2015-10-20 ENCOUNTER — Ambulatory Visit: Payer: BLUE CROSS/BLUE SHIELD | Admitting: *Deleted

## 2015-10-20 VITALS — BP 118/80 | HR 80

## 2015-10-20 DIAGNOSIS — Z0130 Encounter for examination of blood pressure without abnormal findings: Secondary | ICD-10-CM

## 2015-10-20 NOTE — Patient Instructions (Signed)
Check your BP at home daily. Let us know of any concerns. Follow up as scheduled.

## 2015-10-20 NOTE — Progress Notes (Signed)
Patient came in for nurse BP check.  Note VS as outlined. Pt reports that she is taking meds differently than instructed: Had been told to cut losartan dose from 100mg  to 50mg  daily. She reports that after 2 weeks she went off this medication completely.  She has no recent home BPs to report. I advised to check BP at home. If differs greatly from VS obtained today, to let us know. O/w, continue to check daily and call for any other concerns.

## 2015-10-20 NOTE — Progress Notes (Signed)
The goal was for her to be off of losartan. Her blood pressure looks great. Would just simply continue with her other medications.  Leonie Man, MD

## 2015-12-28 ENCOUNTER — Ambulatory Visit (INDEPENDENT_AMBULATORY_CARE_PROVIDER_SITE_OTHER): Payer: BLUE CROSS/BLUE SHIELD | Admitting: Adult Health

## 2015-12-28 ENCOUNTER — Encounter: Payer: Self-pay | Admitting: Adult Health

## 2015-12-28 VITALS — BP 117/84 | HR 97 | Ht 65.0 in | Wt 155.5 lb

## 2015-12-28 DIAGNOSIS — G43009 Migraine without aura, not intractable, without status migrainosus: Secondary | ICD-10-CM

## 2015-12-28 NOTE — Patient Instructions (Signed)
Decide about Botox. If your symptoms worsen or you develop new symptoms please let us know.

## 2015-12-28 NOTE — Progress Notes (Signed)
PATIENT: Brenda Carney DOB: [birth year]  REASON FOR VISIT: follow up-  Chronic migraines HISTORY FROM: patient  HISTORY OF PRESENT ILLNESS:  Brenda Carney is a 65 year old female with a history of chronic migraines. She returns today for follow-up. The patient states that she went several months without doing Botox injections  and migraines did not return however approximately 2 months ago she began to have headaches again. [facility] and had a physician there complete her Botox. She states that when she had Botox through our office it was very painful  and it left her with distorted eyebrows. when she gets it completed  By the cosmetic surgeon is not as painful But unfortunately insurance does not cover it. She reports that when she gets the injections she does not have a migraine headache. Occasionally she may have a regular headache but that's very infrequent. The patient also states that approximately 3 weeks ago her husband suddenly passed away from a heart attack. She denies any new neurological symptoms. She returns today for an evaluation. HISTORY 06/24/15: [name] is a 65 year old female with a history of chronic migraines. She returns today for follow-up. She receives Botox injections for her migraines. Her last injection was in January 2016. She reports that she did not have her Botox injections in April because the last injections distorted her face. She states that her eyebrows were distorted for months. She does state that her migraines have resolved with Botox. Since starting Botox she has not had a single migraine. Even though she is not had this last round of injections she is not experienced a migraine. The patient had back surgery in October and states that her pain has resolved and her back. She states that she may need an additional surgery later on. The patient also states that she got new glasses and states that may have helped her migraine frequency as  well. She returns today for an evaluation.  HISTORY 12/11/14: Brenda Carney is a 65 year old female with a history of chronic migraines. She returns today for follow-up. The patient is currently receiving Botox injections for her migraines. Her last injection was in October. She reports that the Botox has been beneficial. She states that she has only had one migraine since her Botox injection in October. She states that she will occasionally take Excedrin migraine but not frequently. Patient states that she did see her PCP for her BP. She states that they did adjust her medications but her BP will still run higher at times. She reports that she has not taken her medication this morning. Patient states that since her last visit she had back surgery in October and that has been beneficial.  / REVIEW OF SYSTEMS: Out of a complete 14 system review of symptoms, the patient complains only of the following symptoms, and all other reviewed systems are negative.   see history of present illness  ALLERGIES: Allergies  Allergen Reactions  . Lyrica [Pregabalin] Other (See Comments)    DRY MOUTH  . Penicillins Swelling    Childhood reaction- tongue swelled    HOME MEDICATIONS: Outpatient Prescriptions Prior to Visit  Medication Sig Dispense Refill  . amLODipine (NORVASC) 5 MG tablet Take 1 tablet (5 mg total) by mouth daily. 30 tablet 11  . ARIPiprazole (ABILIFY) 5 MG tablet Take 5 mg by mouth daily.     Marland Kitchen aspirin EC 81 MG tablet Take 81 mg by mouth 2 (two) times daily.    Marland Kitchen  desvenlafaxine (PRISTIQ) 100 MG 24 hr tablet Take 100 mg by mouth daily.    . diazepam (VALIUM) 10 MG tablet Take 10 mg by mouth daily as needed for anxiety.    . fenofibrate (TRICOR) 145 MG tablet Take 145 mg by mouth daily.   1  . Ipratropium-Albuterol (COMBIVENT RESPIMAT) 20-100 MCG/ACT AERS respimat Inhale 2 puffs into the lungs every 6 (six) hours as needed for wheezing.    Javier Docker Oil 300 MG CAPS Take 300 mg by mouth daily.      Marland Kitchen lisinopril-hydrochlorothiazide (PRINZIDE,ZESTORETIC) 20-12.5 MG per tablet Take 2 tablets by mouth daily.   0  . metoprolol succinate (TOPROL-XL) 50 MG 24 hr tablet Take 50 mg by mouth daily.  4  . multivitamin-iron-minerals-folic acid (CENTRUM) chewable tablet Chew 1 tablet by mouth daily.    . NUCYNTA 75 MG TABS Take 75 mg by mouth daily as needed (one tablet every 12 hours as need for back pain).   0  . Pitavastatin Calcium 4 MG TABS Take 1 tablet (4 mg total) by mouth daily. 30 tablet 5  . zolpidem (AMBIEN) 10 MG tablet Take 15 mg by mouth at bedtime as needed for sleep (May take one and half tablets by mouth nightly as needed for sleep).     . losartan (COZAAR) 100 MG tablet Take 50 mg by mouth daily.     Facility-Administered Medications Prior to Visit  Medication Dose Route Frequency Provider Last Rate Last Dose  . botulinum toxin Type A (BOTOX) injection 200 Units  200 Units Intramuscular Once Drema Dallas, DO        PAST MEDICAL HISTORY: Past Medical History  Diagnosis Date  . TIA (transient ischemic attack) 09/2011  . Cerebrovascular disease     Carotid dopplers which showed no significant increase in velocities, extremely minimal on the left, antegrade vertebral bilaterally.  . 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 Hypertension, benign   . Hyperlipidemia with target LDL less than 100   . Headache(784.0) 10/07/2013  . Cervical spondylosis   . Lumbosacral spondylosis   . Migraine headache   . History of stress test 03/2008    the post stress myocardial perfusion images show a normal pattern of perfusion in all regions. The post ventricle is normal in size. There is no scintigraphic evidence of inducible myocardial ichemia. There is prominent gut uptake activity noted in the infero-apical region.  . Arthritis   . Exercise-induced asthma with acute exacerbation     no recen exacerbation  . History of IBS      resovlved  . Heart murmur     Echo 08/2013: Suboptimal imaging. EF roughly 55%. Grade 1 diastolic dysfunction.  . Sleep apnea     does not wear CPAP  . Pneumonia   . GERD (gastroesophageal reflux disease)   . Hx of multiple concussions     PAST SURGICAL HISTORY: Past Surgical History  Procedure Laterality Date  . 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 Foot fracture surgery      Multiple procedures, bilateral feet  . Left a.c. joint surgery  1990s  . Maximum access (mas)posterior lumbar interbody fusion (plif) 1 level N/A 09/30/2014    Procedure: FOR MAXIMUM ACCESS (MAS) POSTERIOR LUMBAR INTERBODY FUSION (PLIF) 1 LEVEL;  Surgeon: Eustace Moore, MD;  Location: [facility];  Service: Neurosurgery;  Laterality: N/A;  FOR MAXIMUM ACCESS (MAS) POSTERIOR LUMBAR INTERBODY FUSION (PLIF) 1 LEVEL LUMBAR 4-5    FAMILY HISTORY: Family History  Problem Relation Age of Onset  . Stroke Father   . Heart attack Mother   . Stroke Mother     SOCIAL HISTORY: Social History   Social History  . Marital Status: Widowed    Spouse Name: [name]   . Number of Children: 0  . Years of Education: COLLEGE   Occupational History  . Horse Breeder     Social History Main Topics  . Smoking status: Current Some Day Smoker    Last Attempt to Quit: 06/09/2010  . Smokeless tobacco: Never Used  . Alcohol Use: 0.0 oz/week    0 Standard drinks or equivalent per week     Comment: ocassional wine  . Drug Use: No  . Sexual Activity: Not on file   Other Topics Concern  . Not on file   Social History Narrative   Patient lives at home with her husband [name]. Patient has no children.    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    Patient has BA degree.    Smokes 2-3 cigarettes /day -- former heavy smoker (failed "quitting") -- now says she has truly "QUIT"      PHYSICAL EXAM  Filed Vitals:   12/28/15 1431  BP: 117/84  Pulse: 97  Height:  5\' 5"  (1.651 m)  Weight: 155 lb 8 oz (70.534 kg)   Body mass index is 25.88 kg/(m^2).  Generalized: Well developed, in no acute distress   Neurological examination  Mentation: Alert oriented to time, place, history taking. Follows all commands speech and language fluent Cranial nerve II-XII: Pupils were equal round reactive to light. Extraocular movements were full, visual field were full on confrontational test. Facial sensation and strength were normal. Uvula tongue midline. Head turning and shoulder shrug  were normal and symmetric. Motor: The motor testing reveals 5 over 5 strength of all 4 extremities. Good symmetric motor tone is noted throughout.  Sensory: Sensory testing is intact to soft touch on all 4 extremities. No evidence of extinction is noted.  Coordination: Cerebellar testing reveals good finger-nose-finger and heel-to-shin bilaterally.  Gait and station: Gait is normal. Tandem gait is  Slightly unsteady. Romberg is negative. No drift is seen.  Reflexes: Deep tendon reflexes are symmetric and normal bilaterally.   DIAGNOSTIC DATA (LABS, IMAGING, TESTING) - I reviewed patient records, labs, notes, testing and imaging myself where available.  Lab Results  Component Value Date   WBC 9.4 09/17/2014   HGB 16.0* 09/17/2014   HCT 45.2 09/17/2014   MCV 100.7* 09/17/2014   PLT 359 09/17/2014      Component Value Date/Time   NA 141 09/17/2014 1157   K 3.8 09/17/2014 1157   CL 99 09/17/2014 1157   CO2 25 09/17/2014 1157   GLUCOSE 94 09/17/2014 1157   BUN 10 09/17/2014 1157   CREATININE 0.70 09/17/2014 1157   CREATININE 0.84 06/23/2014 0935   CALCIUM 9.9 09/17/2014 1157   PROT 7.0 03/30/2015 1545   ALBUMIN 4.3 03/30/2015 1545   AST 33 03/30/2015 1545   ALT 32 03/30/2015 1545   ALKPHOS 78 03/30/2015 1545   BILITOT 0.4 03/30/2015 1545   GFRNONAA >90 09/17/2014 1157   GFRAA >90 09/17/2014 1157   Lab Results  Component Value Date   CHOL 350* 03/30/2015   HDL 35*  03/30/2015   LDLCALC NOT CALC 03/30/2015  LDLDIRECT 186* 03/30/2015   TRIG 865* 03/30/2015   CHOLHDL 10.0 03/30/2015       ASSESSMENT AND PLAN 65 y.o. year old female  has a past medical history of TIA (transient ischemic attack) (09/2011); Cerebrovascular disease; Cerebral aneurysm; Hypertension, benign; Hyperlipidemia with target LDL less than 100; Headache(784.0) (10/07/2013); Cervical spondylosis; Lumbosacral spondylosis; Migraine headache; History of stress test (03/2008); Arthritis; Exercise-induced asthma with acute exacerbation; History of IBS; Heart murmur; Sleep apnea; Pneumonia; GERD (gastroesophageal reflux disease); and multiple concussions. here with:   1.  Migraine headaches  Patient's headaches continue to be controlled with Botox injections. I explained to the patient that we can have another physician in our office complete her Botox but there is no guarantees that it would be less painful. Patient voiced understanding. She states that she will let us know what she decides. Patient advised that if her headache frequency increases or if she finds that Botox is not beneficial she will let us know. She will follow-up in 6 months with Dr. Jannifer Franklin.    Ward Givens, MSN, NP-C 12/28/2015, 3:06 PM [facility] [address] [place], [place] [address] [phone]

## 2015-12-28 NOTE — Progress Notes (Signed)
I have read the note, and I agree with the clinical assessment and plan.  Carlye Panameno KEITH   

## 2016-02-10 ENCOUNTER — Other Ambulatory Visit: Payer: Self-pay | Admitting: Cardiology

## 2016-02-10 NOTE — Telephone Encounter (Signed)
Rx request sent to pharmacy.  

## 2016-03-20 DIAGNOSIS — M545 Low back pain: Secondary | ICD-10-CM | POA: Diagnosis not present

## 2016-03-27 DIAGNOSIS — M545 Low back pain: Secondary | ICD-10-CM | POA: Diagnosis not present

## 2016-03-31 DIAGNOSIS — M545 Low back pain: Secondary | ICD-10-CM | POA: Diagnosis not present

## 2016-04-03 DIAGNOSIS — M545 Low back pain: Secondary | ICD-10-CM | POA: Diagnosis not present

## 2016-04-06 DIAGNOSIS — M545 Low back pain: Secondary | ICD-10-CM | POA: Diagnosis not present

## 2016-04-10 DIAGNOSIS — M545 Low back pain: Secondary | ICD-10-CM | POA: Diagnosis not present

## 2016-04-12 DIAGNOSIS — M545 Low back pain: Secondary | ICD-10-CM | POA: Diagnosis not present

## 2016-04-19 DIAGNOSIS — M545 Low back pain: Secondary | ICD-10-CM | POA: Diagnosis not present

## 2016-04-21 DIAGNOSIS — M545 Low back pain: Secondary | ICD-10-CM | POA: Diagnosis not present

## 2016-04-26 DIAGNOSIS — M5137 Other intervertebral disc degeneration, lumbosacral region: Secondary | ICD-10-CM | POA: Diagnosis not present

## 2016-04-26 DIAGNOSIS — Z79899 Other long term (current) drug therapy: Secondary | ICD-10-CM | POA: Diagnosis not present

## 2016-04-26 DIAGNOSIS — G894 Chronic pain syndrome: Secondary | ICD-10-CM | POA: Diagnosis not present

## 2016-04-26 DIAGNOSIS — M961 Postlaminectomy syndrome, not elsewhere classified: Secondary | ICD-10-CM | POA: Diagnosis not present

## 2016-04-26 DIAGNOSIS — M47817 Spondylosis without myelopathy or radiculopathy, lumbosacral region: Secondary | ICD-10-CM | POA: Diagnosis not present

## 2016-04-26 DIAGNOSIS — Z79891 Long term (current) use of opiate analgesic: Secondary | ICD-10-CM | POA: Diagnosis not present

## 2016-05-08 DIAGNOSIS — M5137 Other intervertebral disc degeneration, lumbosacral region: Secondary | ICD-10-CM | POA: Diagnosis not present

## 2016-05-08 DIAGNOSIS — M545 Low back pain: Secondary | ICD-10-CM | POA: Diagnosis not present

## 2016-05-08 DIAGNOSIS — M961 Postlaminectomy syndrome, not elsewhere classified: Secondary | ICD-10-CM | POA: Diagnosis not present

## 2016-05-10 DIAGNOSIS — F4312 Post-traumatic stress disorder, chronic: Secondary | ICD-10-CM | POA: Diagnosis not present

## 2016-05-10 DIAGNOSIS — F39 Unspecified mood [affective] disorder: Secondary | ICD-10-CM | POA: Diagnosis not present

## 2016-05-10 DIAGNOSIS — F341 Dysthymic disorder: Secondary | ICD-10-CM | POA: Diagnosis not present

## 2016-05-10 DIAGNOSIS — G4730 Sleep apnea, unspecified: Secondary | ICD-10-CM | POA: Diagnosis not present

## 2016-05-24 DIAGNOSIS — Z79899 Other long term (current) drug therapy: Secondary | ICD-10-CM | POA: Diagnosis not present

## 2016-05-24 DIAGNOSIS — M545 Low back pain: Secondary | ICD-10-CM | POA: Diagnosis not present

## 2016-05-24 DIAGNOSIS — M5137 Other intervertebral disc degeneration, lumbosacral region: Secondary | ICD-10-CM | POA: Diagnosis not present

## 2016-05-24 DIAGNOSIS — G894 Chronic pain syndrome: Secondary | ICD-10-CM | POA: Diagnosis not present

## 2016-05-24 DIAGNOSIS — M961 Postlaminectomy syndrome, not elsewhere classified: Secondary | ICD-10-CM | POA: Diagnosis not present

## 2016-05-24 DIAGNOSIS — Z79891 Long term (current) use of opiate analgesic: Secondary | ICD-10-CM | POA: Diagnosis not present

## 2016-05-24 DIAGNOSIS — M47817 Spondylosis without myelopathy or radiculopathy, lumbosacral region: Secondary | ICD-10-CM | POA: Diagnosis not present

## 2016-06-05 DIAGNOSIS — Z79899 Other long term (current) drug therapy: Secondary | ICD-10-CM | POA: Diagnosis not present

## 2016-06-05 DIAGNOSIS — Z79891 Long term (current) use of opiate analgesic: Secondary | ICD-10-CM | POA: Diagnosis not present

## 2016-06-05 DIAGNOSIS — M545 Low back pain: Secondary | ICD-10-CM | POA: Diagnosis not present

## 2016-06-05 DIAGNOSIS — M961 Postlaminectomy syndrome, not elsewhere classified: Secondary | ICD-10-CM | POA: Diagnosis not present

## 2016-06-05 DIAGNOSIS — G894 Chronic pain syndrome: Secondary | ICD-10-CM | POA: Diagnosis not present

## 2016-06-05 DIAGNOSIS — M47817 Spondylosis without myelopathy or radiculopathy, lumbosacral region: Secondary | ICD-10-CM | POA: Diagnosis not present

## 2016-06-05 DIAGNOSIS — M5137 Other intervertebral disc degeneration, lumbosacral region: Secondary | ICD-10-CM | POA: Diagnosis not present

## 2016-06-08 ENCOUNTER — Telehealth: Payer: Self-pay | Admitting: Cardiology

## 2016-06-08 NOTE — Telephone Encounter (Signed)
New message     The office need to verify the dose on the Lisinopril/HCTZ for the pt records at their office please just have Brenda Carney over head paged

## 2016-06-08 NOTE — Telephone Encounter (Signed)
Left msg for Brenda Carney w/ med info and advised to call back if further questions or needs.

## 2016-06-20 DIAGNOSIS — G894 Chronic pain syndrome: Secondary | ICD-10-CM | POA: Diagnosis not present

## 2016-06-20 DIAGNOSIS — M47817 Spondylosis without myelopathy or radiculopathy, lumbosacral region: Secondary | ICD-10-CM | POA: Diagnosis not present

## 2016-06-20 DIAGNOSIS — Z79899 Other long term (current) drug therapy: Secondary | ICD-10-CM | POA: Diagnosis not present

## 2016-06-20 DIAGNOSIS — M961 Postlaminectomy syndrome, not elsewhere classified: Secondary | ICD-10-CM | POA: Diagnosis not present

## 2016-06-20 DIAGNOSIS — Z79891 Long term (current) use of opiate analgesic: Secondary | ICD-10-CM | POA: Diagnosis not present

## 2016-06-20 DIAGNOSIS — M5137 Other intervertebral disc degeneration, lumbosacral region: Secondary | ICD-10-CM | POA: Diagnosis not present

## 2016-07-03 DIAGNOSIS — M47817 Spondylosis without myelopathy or radiculopathy, lumbosacral region: Secondary | ICD-10-CM | POA: Diagnosis not present

## 2016-07-03 DIAGNOSIS — M545 Low back pain: Secondary | ICD-10-CM | POA: Diagnosis not present

## 2016-07-03 DIAGNOSIS — M5137 Other intervertebral disc degeneration, lumbosacral region: Secondary | ICD-10-CM | POA: Diagnosis not present

## 2016-07-03 DIAGNOSIS — M961 Postlaminectomy syndrome, not elsewhere classified: Secondary | ICD-10-CM | POA: Diagnosis not present

## 2016-07-05 ENCOUNTER — Ambulatory Visit: Payer: BLUE CROSS/BLUE SHIELD | Admitting: Neurology

## 2016-07-20 DIAGNOSIS — G894 Chronic pain syndrome: Secondary | ICD-10-CM | POA: Diagnosis not present

## 2016-07-20 DIAGNOSIS — M545 Low back pain: Secondary | ICD-10-CM | POA: Diagnosis not present

## 2016-07-20 DIAGNOSIS — M5137 Other intervertebral disc degeneration, lumbosacral region: Secondary | ICD-10-CM | POA: Diagnosis not present

## 2016-07-20 DIAGNOSIS — Z79891 Long term (current) use of opiate analgesic: Secondary | ICD-10-CM | POA: Diagnosis not present

## 2016-07-20 DIAGNOSIS — M961 Postlaminectomy syndrome, not elsewhere classified: Secondary | ICD-10-CM | POA: Diagnosis not present

## 2016-07-20 DIAGNOSIS — Z79899 Other long term (current) drug therapy: Secondary | ICD-10-CM | POA: Diagnosis not present

## 2016-07-31 DIAGNOSIS — M961 Postlaminectomy syndrome, not elsewhere classified: Secondary | ICD-10-CM | POA: Diagnosis not present

## 2016-07-31 DIAGNOSIS — M47817 Spondylosis without myelopathy or radiculopathy, lumbosacral region: Secondary | ICD-10-CM | POA: Diagnosis not present

## 2016-07-31 DIAGNOSIS — M5137 Other intervertebral disc degeneration, lumbosacral region: Secondary | ICD-10-CM | POA: Diagnosis not present

## 2016-07-31 DIAGNOSIS — M545 Low back pain: Secondary | ICD-10-CM | POA: Diagnosis not present

## 2016-08-16 DIAGNOSIS — H04123 Dry eye syndrome of bilateral lacrimal glands: Secondary | ICD-10-CM | POA: Diagnosis not present

## 2016-08-16 DIAGNOSIS — H02824 Cysts of left upper eyelid: Secondary | ICD-10-CM | POA: Diagnosis not present

## 2016-08-16 DIAGNOSIS — H2512 Age-related nuclear cataract, left eye: Secondary | ICD-10-CM | POA: Diagnosis not present

## 2016-08-17 DIAGNOSIS — M5137 Other intervertebral disc degeneration, lumbosacral region: Secondary | ICD-10-CM | POA: Diagnosis not present

## 2016-08-17 DIAGNOSIS — G894 Chronic pain syndrome: Secondary | ICD-10-CM | POA: Diagnosis not present

## 2016-08-17 DIAGNOSIS — Z79899 Other long term (current) drug therapy: Secondary | ICD-10-CM | POA: Diagnosis not present

## 2016-08-17 DIAGNOSIS — M961 Postlaminectomy syndrome, not elsewhere classified: Secondary | ICD-10-CM | POA: Diagnosis not present

## 2016-08-17 DIAGNOSIS — Z79891 Long term (current) use of opiate analgesic: Secondary | ICD-10-CM | POA: Diagnosis not present

## 2016-08-17 DIAGNOSIS — M47817 Spondylosis without myelopathy or radiculopathy, lumbosacral region: Secondary | ICD-10-CM | POA: Diagnosis not present

## 2016-09-05 DIAGNOSIS — F39 Unspecified mood [affective] disorder: Secondary | ICD-10-CM | POA: Diagnosis not present

## 2016-09-05 DIAGNOSIS — F341 Dysthymic disorder: Secondary | ICD-10-CM | POA: Diagnosis not present

## 2016-09-05 DIAGNOSIS — F4312 Post-traumatic stress disorder, chronic: Secondary | ICD-10-CM | POA: Diagnosis not present

## 2016-09-11 DIAGNOSIS — D2312 Other benign neoplasm of skin of left eyelid, including canthus: Secondary | ICD-10-CM | POA: Diagnosis not present

## 2016-09-14 DIAGNOSIS — M47817 Spondylosis without myelopathy or radiculopathy, lumbosacral region: Secondary | ICD-10-CM | POA: Diagnosis not present

## 2016-09-14 DIAGNOSIS — M5137 Other intervertebral disc degeneration, lumbosacral region: Secondary | ICD-10-CM | POA: Diagnosis not present

## 2016-09-14 DIAGNOSIS — Z79891 Long term (current) use of opiate analgesic: Secondary | ICD-10-CM | POA: Diagnosis not present

## 2016-09-14 DIAGNOSIS — Z79899 Other long term (current) drug therapy: Secondary | ICD-10-CM | POA: Diagnosis not present

## 2016-09-14 DIAGNOSIS — M961 Postlaminectomy syndrome, not elsewhere classified: Secondary | ICD-10-CM | POA: Diagnosis not present

## 2016-09-14 DIAGNOSIS — G894 Chronic pain syndrome: Secondary | ICD-10-CM | POA: Diagnosis not present

## 2016-09-29 DIAGNOSIS — I10 Essential (primary) hypertension: Secondary | ICD-10-CM | POA: Diagnosis not present

## 2016-09-29 DIAGNOSIS — J4540 Moderate persistent asthma, uncomplicated: Secondary | ICD-10-CM | POA: Diagnosis not present

## 2016-09-29 DIAGNOSIS — E039 Hypothyroidism, unspecified: Secondary | ICD-10-CM | POA: Diagnosis not present

## 2016-09-29 DIAGNOSIS — Z23 Encounter for immunization: Secondary | ICD-10-CM | POA: Diagnosis not present

## 2016-09-29 DIAGNOSIS — G43909 Migraine, unspecified, not intractable, without status migrainosus: Secondary | ICD-10-CM | POA: Diagnosis not present

## 2016-09-29 DIAGNOSIS — F329 Major depressive disorder, single episode, unspecified: Secondary | ICD-10-CM | POA: Diagnosis not present

## 2016-09-29 DIAGNOSIS — E785 Hyperlipidemia, unspecified: Secondary | ICD-10-CM | POA: Diagnosis not present

## 2016-09-29 DIAGNOSIS — F17200 Nicotine dependence, unspecified, uncomplicated: Secondary | ICD-10-CM | POA: Diagnosis not present

## 2016-09-29 DIAGNOSIS — G894 Chronic pain syndrome: Secondary | ICD-10-CM | POA: Diagnosis not present

## 2016-09-29 DIAGNOSIS — Z780 Asymptomatic menopausal state: Secondary | ICD-10-CM | POA: Diagnosis not present

## 2016-09-29 DIAGNOSIS — G459 Transient cerebral ischemic attack, unspecified: Secondary | ICD-10-CM | POA: Diagnosis not present

## 2016-09-29 DIAGNOSIS — Z720 Tobacco use: Secondary | ICD-10-CM | POA: Diagnosis not present

## 2016-09-29 DIAGNOSIS — Z1239 Encounter for other screening for malignant neoplasm of breast: Secondary | ICD-10-CM | POA: Diagnosis not present

## 2016-09-29 DIAGNOSIS — Z0000 Encounter for general adult medical examination without abnormal findings: Secondary | ICD-10-CM | POA: Diagnosis not present

## 2016-10-12 DIAGNOSIS — Z79891 Long term (current) use of opiate analgesic: Secondary | ICD-10-CM | POA: Diagnosis not present

## 2016-10-12 DIAGNOSIS — M961 Postlaminectomy syndrome, not elsewhere classified: Secondary | ICD-10-CM | POA: Diagnosis not present

## 2016-10-12 DIAGNOSIS — M533 Sacrococcygeal disorders, not elsewhere classified: Secondary | ICD-10-CM | POA: Diagnosis not present

## 2016-10-12 DIAGNOSIS — Z79899 Other long term (current) drug therapy: Secondary | ICD-10-CM | POA: Diagnosis not present

## 2016-10-12 DIAGNOSIS — M5137 Other intervertebral disc degeneration, lumbosacral region: Secondary | ICD-10-CM | POA: Diagnosis not present

## 2016-10-12 DIAGNOSIS — M47817 Spondylosis without myelopathy or radiculopathy, lumbosacral region: Secondary | ICD-10-CM | POA: Diagnosis not present

## 2016-10-12 DIAGNOSIS — G894 Chronic pain syndrome: Secondary | ICD-10-CM | POA: Diagnosis not present

## 2016-10-27 DIAGNOSIS — Z780 Asymptomatic menopausal state: Secondary | ICD-10-CM | POA: Diagnosis not present

## 2016-10-27 DIAGNOSIS — F329 Major depressive disorder, single episode, unspecified: Secondary | ICD-10-CM | POA: Diagnosis not present

## 2016-10-27 DIAGNOSIS — E876 Hypokalemia: Secondary | ICD-10-CM | POA: Diagnosis not present

## 2016-10-27 DIAGNOSIS — E782 Mixed hyperlipidemia: Secondary | ICD-10-CM | POA: Diagnosis not present

## 2016-10-27 DIAGNOSIS — J449 Chronic obstructive pulmonary disease, unspecified: Secondary | ICD-10-CM | POA: Diagnosis not present

## 2016-10-27 DIAGNOSIS — Z1239 Encounter for other screening for malignant neoplasm of breast: Secondary | ICD-10-CM | POA: Diagnosis not present

## 2016-10-27 DIAGNOSIS — Z716 Tobacco abuse counseling: Secondary | ICD-10-CM | POA: Diagnosis not present

## 2016-10-27 DIAGNOSIS — E039 Hypothyroidism, unspecified: Secondary | ICD-10-CM | POA: Diagnosis not present

## 2016-10-31 ENCOUNTER — Other Ambulatory Visit: Payer: Self-pay | Admitting: Internal Medicine

## 2016-10-31 DIAGNOSIS — Z1231 Encounter for screening mammogram for malignant neoplasm of breast: Secondary | ICD-10-CM

## 2016-11-13 DIAGNOSIS — Z79891 Long term (current) use of opiate analgesic: Secondary | ICD-10-CM | POA: Diagnosis not present

## 2016-11-13 DIAGNOSIS — M47817 Spondylosis without myelopathy or radiculopathy, lumbosacral region: Secondary | ICD-10-CM | POA: Diagnosis not present

## 2016-11-13 DIAGNOSIS — M961 Postlaminectomy syndrome, not elsewhere classified: Secondary | ICD-10-CM | POA: Diagnosis not present

## 2016-11-13 DIAGNOSIS — Z79899 Other long term (current) drug therapy: Secondary | ICD-10-CM | POA: Diagnosis not present

## 2016-11-13 DIAGNOSIS — M545 Low back pain: Secondary | ICD-10-CM | POA: Diagnosis not present

## 2016-11-13 DIAGNOSIS — M533 Sacrococcygeal disorders, not elsewhere classified: Secondary | ICD-10-CM | POA: Diagnosis not present

## 2016-11-13 DIAGNOSIS — G894 Chronic pain syndrome: Secondary | ICD-10-CM | POA: Diagnosis not present

## 2016-11-13 DIAGNOSIS — M5137 Other intervertebral disc degeneration, lumbosacral region: Secondary | ICD-10-CM | POA: Diagnosis not present

## 2016-11-14 ENCOUNTER — Ambulatory Visit
Admission: RE | Admit: 2016-11-14 | Discharge: 2016-11-14 | Disposition: A | Discharge status: Home / Self Care | Payer: Medicare Other | Source: Ambulatory Visit | Attending: Internal Medicine | Admitting: Internal Medicine

## 2016-11-14 DIAGNOSIS — Z1231 Encounter for screening mammogram for malignant neoplasm of breast: Secondary | ICD-10-CM

## 2016-11-22 DIAGNOSIS — J42 Unspecified chronic bronchitis: Secondary | ICD-10-CM | POA: Diagnosis not present

## 2016-11-29 DIAGNOSIS — M8588 Other specified disorders of bone density and structure, other site: Secondary | ICD-10-CM | POA: Diagnosis not present

## 2016-12-13 DIAGNOSIS — M533 Sacrococcygeal disorders, not elsewhere classified: Secondary | ICD-10-CM | POA: Diagnosis not present

## 2016-12-13 DIAGNOSIS — G894 Chronic pain syndrome: Secondary | ICD-10-CM | POA: Diagnosis not present

## 2016-12-13 DIAGNOSIS — M47817 Spondylosis without myelopathy or radiculopathy, lumbosacral region: Secondary | ICD-10-CM | POA: Diagnosis not present

## 2016-12-13 DIAGNOSIS — Z79899 Other long term (current) drug therapy: Secondary | ICD-10-CM | POA: Diagnosis not present

## 2016-12-13 DIAGNOSIS — Z79891 Long term (current) use of opiate analgesic: Secondary | ICD-10-CM | POA: Diagnosis not present

## 2016-12-13 DIAGNOSIS — M961 Postlaminectomy syndrome, not elsewhere classified: Secondary | ICD-10-CM | POA: Diagnosis not present

## 2017-01-03 DIAGNOSIS — G4730 Sleep apnea, unspecified: Secondary | ICD-10-CM | POA: Diagnosis not present

## 2017-01-03 DIAGNOSIS — F4312 Post-traumatic stress disorder, chronic: Secondary | ICD-10-CM | POA: Diagnosis not present

## 2017-01-03 DIAGNOSIS — F341 Dysthymic disorder: Secondary | ICD-10-CM | POA: Diagnosis not present

## 2017-01-03 DIAGNOSIS — Z634 Disappearance and death of family member: Secondary | ICD-10-CM | POA: Diagnosis not present

## 2017-01-03 DIAGNOSIS — G4709 Other insomnia: Secondary | ICD-10-CM | POA: Diagnosis not present

## 2017-01-05 DIAGNOSIS — H524 Presbyopia: Secondary | ICD-10-CM | POA: Diagnosis not present

## 2017-01-05 DIAGNOSIS — H2513 Age-related nuclear cataract, bilateral: Secondary | ICD-10-CM | POA: Diagnosis not present

## 2017-01-05 DIAGNOSIS — H04123 Dry eye syndrome of bilateral lacrimal glands: Secondary | ICD-10-CM | POA: Diagnosis not present

## 2017-01-05 DIAGNOSIS — H0012 Chalazion right lower eyelid: Secondary | ICD-10-CM | POA: Diagnosis not present

## 2017-02-28 DIAGNOSIS — G894 Chronic pain syndrome: Secondary | ICD-10-CM | POA: Diagnosis not present

## 2017-02-28 DIAGNOSIS — M5137 Other intervertebral disc degeneration, lumbosacral region: Secondary | ICD-10-CM | POA: Diagnosis not present

## 2017-02-28 DIAGNOSIS — M79606 Pain in leg, unspecified: Secondary | ICD-10-CM | POA: Diagnosis not present

## 2017-02-28 DIAGNOSIS — M47817 Spondylosis without myelopathy or radiculopathy, lumbosacral region: Secondary | ICD-10-CM | POA: Diagnosis not present

## 2017-02-28 DIAGNOSIS — M961 Postlaminectomy syndrome, not elsewhere classified: Secondary | ICD-10-CM | POA: Diagnosis not present

## 2017-02-28 DIAGNOSIS — Z79891 Long term (current) use of opiate analgesic: Secondary | ICD-10-CM | POA: Diagnosis not present

## 2017-02-28 DIAGNOSIS — Z79899 Other long term (current) drug therapy: Secondary | ICD-10-CM | POA: Diagnosis not present

## 2017-02-28 DIAGNOSIS — M533 Sacrococcygeal disorders, not elsewhere classified: Secondary | ICD-10-CM | POA: Diagnosis not present

## 2017-03-29 DIAGNOSIS — Z79899 Other long term (current) drug therapy: Secondary | ICD-10-CM | POA: Diagnosis not present

## 2017-03-29 DIAGNOSIS — M5137 Other intervertebral disc degeneration, lumbosacral region: Secondary | ICD-10-CM | POA: Diagnosis not present

## 2017-03-29 DIAGNOSIS — M961 Postlaminectomy syndrome, not elsewhere classified: Secondary | ICD-10-CM | POA: Diagnosis not present

## 2017-03-29 DIAGNOSIS — Z79891 Long term (current) use of opiate analgesic: Secondary | ICD-10-CM | POA: Diagnosis not present

## 2017-03-29 DIAGNOSIS — G894 Chronic pain syndrome: Secondary | ICD-10-CM | POA: Diagnosis not present

## 2017-03-29 DIAGNOSIS — M79606 Pain in leg, unspecified: Secondary | ICD-10-CM | POA: Diagnosis not present

## 2017-04-09 ENCOUNTER — Ambulatory Visit
Admission: RE | Admit: 2017-04-09 | Discharge: 2017-04-09 | Disposition: A | Discharge status: Home / Self Care | Payer: Medicare Other | Source: Ambulatory Visit | Attending: Internal Medicine | Admitting: Internal Medicine

## 2017-04-09 ENCOUNTER — Other Ambulatory Visit (HOSPITAL_COMMUNITY): Payer: Self-pay | Admitting: Internal Medicine

## 2017-04-09 ENCOUNTER — Other Ambulatory Visit: Payer: Self-pay | Admitting: Internal Medicine

## 2017-04-09 ENCOUNTER — Ambulatory Visit (HOSPITAL_COMMUNITY)
Admission: RE | Admit: 2017-04-09 | Discharge: 2017-04-09 | Disposition: A | Discharge status: Home / Self Care | Payer: Medicare Other | Source: Ambulatory Visit | Attending: Internal Medicine | Admitting: Internal Medicine

## 2017-04-09 DIAGNOSIS — M79604 Pain in right leg: Secondary | ICD-10-CM

## 2017-04-09 DIAGNOSIS — M79661 Pain in right lower leg: Secondary | ICD-10-CM | POA: Diagnosis not present

## 2017-04-09 DIAGNOSIS — S99921A Unspecified injury of right foot, initial encounter: Secondary | ICD-10-CM

## 2017-04-09 DIAGNOSIS — M79671 Pain in right foot: Secondary | ICD-10-CM | POA: Diagnosis not present

## 2017-04-09 DIAGNOSIS — M7989 Other specified soft tissue disorders: Secondary | ICD-10-CM

## 2017-04-09 NOTE — Progress Notes (Signed)
VASCULAR LAB PRELIMINARY  PRELIMINARY  PRELIMINARY  PRELIMINARY  Right lower extremity venous duplex completed.    Preliminary report:  Right:  No evidence of DVT, superficial thrombosis, or Baker's cyst.  Baker Moronta, RVS 04/09/2017, 1:47 PM

## 2017-04-10 ENCOUNTER — Other Ambulatory Visit: Payer: Self-pay | Admitting: Internal Medicine

## 2017-04-10 DIAGNOSIS — M79604 Pain in right leg: Secondary | ICD-10-CM

## 2017-04-10 DIAGNOSIS — R7989 Other specified abnormal findings of blood chemistry: Secondary | ICD-10-CM

## 2017-04-24 HISTORY — PX: MULTIPLE TOOTH EXTRACTIONS: SHX2053

## 2017-04-30 DIAGNOSIS — G894 Chronic pain syndrome: Secondary | ICD-10-CM | POA: Diagnosis not present

## 2017-04-30 DIAGNOSIS — M47817 Spondylosis without myelopathy or radiculopathy, lumbosacral region: Secondary | ICD-10-CM | POA: Diagnosis not present

## 2017-04-30 DIAGNOSIS — Z79891 Long term (current) use of opiate analgesic: Secondary | ICD-10-CM | POA: Diagnosis not present

## 2017-04-30 DIAGNOSIS — M5137 Other intervertebral disc degeneration, lumbosacral region: Secondary | ICD-10-CM | POA: Diagnosis not present

## 2017-04-30 DIAGNOSIS — Z79899 Other long term (current) drug therapy: Secondary | ICD-10-CM | POA: Diagnosis not present

## 2017-04-30 DIAGNOSIS — M533 Sacrococcygeal disorders, not elsewhere classified: Secondary | ICD-10-CM | POA: Diagnosis not present

## 2017-05-01 DIAGNOSIS — L97511 Non-pressure chronic ulcer of other part of right foot limited to breakdown of skin: Secondary | ICD-10-CM | POA: Diagnosis not present

## 2017-05-01 DIAGNOSIS — F329 Major depressive disorder, single episode, unspecified: Secondary | ICD-10-CM | POA: Diagnosis not present

## 2017-05-01 DIAGNOSIS — E039 Hypothyroidism, unspecified: Secondary | ICD-10-CM | POA: Diagnosis not present

## 2017-05-01 DIAGNOSIS — J449 Chronic obstructive pulmonary disease, unspecified: Secondary | ICD-10-CM | POA: Diagnosis not present

## 2017-05-01 DIAGNOSIS — I10 Essential (primary) hypertension: Secondary | ICD-10-CM | POA: Diagnosis not present

## 2017-05-01 DIAGNOSIS — E782 Mixed hyperlipidemia: Secondary | ICD-10-CM | POA: Diagnosis not present

## 2017-05-03 DIAGNOSIS — F341 Dysthymic disorder: Secondary | ICD-10-CM | POA: Diagnosis not present

## 2017-05-03 DIAGNOSIS — Z634 Disappearance and death of family member: Secondary | ICD-10-CM | POA: Diagnosis not present

## 2017-05-03 DIAGNOSIS — G4709 Other insomnia: Secondary | ICD-10-CM | POA: Diagnosis not present

## 2017-05-03 DIAGNOSIS — F39 Unspecified mood [affective] disorder: Secondary | ICD-10-CM | POA: Diagnosis not present

## 2017-05-03 DIAGNOSIS — F4312 Post-traumatic stress disorder, chronic: Secondary | ICD-10-CM | POA: Diagnosis not present

## 2017-05-03 DIAGNOSIS — G4730 Sleep apnea, unspecified: Secondary | ICD-10-CM | POA: Diagnosis not present

## 2017-05-10 ENCOUNTER — Ambulatory Visit (HOSPITAL_COMMUNITY)
Admission: RE | Admit: 2017-05-10 | Discharge: 2017-05-10 | Disposition: A | Discharge status: Home / Self Care | Payer: Medicare Other | Source: Ambulatory Visit | Attending: Vascular Surgery | Admitting: Vascular Surgery

## 2017-05-10 ENCOUNTER — Encounter: Payer: Self-pay | Admitting: Vascular Surgery

## 2017-05-10 ENCOUNTER — Other Ambulatory Visit (HOSPITAL_COMMUNITY): Payer: Self-pay | Admitting: Internal Medicine

## 2017-05-10 DIAGNOSIS — L97911 Non-pressure chronic ulcer of unspecified part of right lower leg limited to breakdown of skin: Secondary | ICD-10-CM | POA: Diagnosis not present

## 2017-05-11 ENCOUNTER — Other Ambulatory Visit: Payer: Self-pay | Admitting: *Deleted

## 2017-05-11 ENCOUNTER — Encounter: Payer: Self-pay | Admitting: Vascular Surgery

## 2017-05-11 ENCOUNTER — Ambulatory Visit (INDEPENDENT_AMBULATORY_CARE_PROVIDER_SITE_OTHER): Payer: Medicare Other | Admitting: Vascular Surgery

## 2017-05-11 ENCOUNTER — Encounter: Payer: Self-pay | Admitting: *Deleted

## 2017-05-11 VITALS — BP 133/95 | HR 108 | Temp 98.6°F | Resp 20 | Ht 65.0 in | Wt 149.2 lb

## 2017-05-11 DIAGNOSIS — I70229 Atherosclerosis of native arteries of extremities with rest pain, unspecified extremity: Secondary | ICD-10-CM

## 2017-05-11 DIAGNOSIS — I998 Other disorder of circulatory system: Secondary | ICD-10-CM

## 2017-05-11 NOTE — Progress Notes (Signed)
Referred by:  Lottie Dawson, MD [place]  [address] [place], [place] [address]  Reason for referral: right foot ischemia   History of Present Illness   Brenda Carney is a 66 y.o. (Apr 11, 1951) female who presents with chief complaint: right foot pain.  Onset of symptom occurred over one year ago, some pain in right foot with walking.  Over last two months, pt has had severe cramping in R calf that she thought was due to a calf muscle injury related to her running.  Pain is described as cramping, severity 5-10/10, and associated with ambulation and rest.  Patient has attempted to treat this pain with rest.  The patient has no rest pain symptoms also and progressing darkness in right toes.  Atherosclerotic risk factors include: HTN, active smoking.  Past Medical History:  Diagnosis Date  . Arthritis   . 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 Cerebrovascular disease    Carotid dopplers which showed no significant increase in velocities, extremely minimal on the left, antegrade vertebral bilaterally.  . Cervical spondylosis   . Exercise-induced asthma with acute exacerbation    no recen exacerbation  . GERD (gastroesophageal reflux disease)   . Headache(784.0) 10/07/2013  . Heart murmur    Echo 08/2013: Suboptimal imaging. EF roughly 55%. Grade 1 diastolic dysfunction.  Marland Kitchen History of IBS    resovlved  . History of stress test 03/2008   the post stress myocardial perfusion images show a normal pattern of perfusion in all regions. The post ventricle is normal in size. There is no scintigraphic evidence of inducible myocardial ichemia. There is prominent gut uptake activity noted in the infero-apical region.  Marland Kitchen Hx of multiple concussions   . Hyperlipidemia with target LDL less than 100   . Hypertension, benign   . Lumbosacral spondylosis   . Migraine headache   . Pneumonia   . Sleep apnea    does not  wear CPAP  . TIA (transient ischemic attack) 09/2011    Past Surgical History:  Procedure Laterality Date  . FOOT FRACTURE SURGERY     Multiple procedures, bilateral feet  . 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 left a.c. joint surgery  1990s  . MAXIMUM ACCESS (MAS)POSTERIOR LUMBAR INTERBODY FUSION (PLIF) 1 LEVEL N/A 09/30/2014   Procedure: FOR MAXIMUM ACCESS (MAS) POSTERIOR LUMBAR INTERBODY FUSION (PLIF) 1 LEVEL;  Surgeon: Eustace Moore, MD;  Location: [facility];  Service: Neurosurgery;  Laterality: N/A;  FOR MAXIMUM ACCESS (MAS) POSTERIOR LUMBAR INTERBODY FUSION (PLIF) 1 LEVEL LUMBAR 4-5    Social History   Social History  . Marital status: Widowed    Spouse name: [name]   . Number of children: 0  . Years of education: COLLEGE   Occupational History  . Horse Breeder     Social History Main Topics  . Smoking status: Current Some Day Smoker    Types: Cigarettes    Last attempt to quit: 06/09/2010  . Smokeless tobacco: Never Used  . Alcohol use 0.0 oz/week     Comment: ocassional wine  . Drug use: No  . Sexual activity: Not on file   Other Topics Concern  . Not on file   Social History Narrative   Patient lives at home with her husband [name]. Patient has no children.    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    Patient has BA degree.    Smokes 2-3 cigarettes /day -- former heavy smoker (failed "quitting") -- now says she has truly "QUIT"    Family History  Problem Relation Age of Onset  . Stroke Father   . Heart attack Mother   . Stroke Mother     Current Outpatient Prescriptions  Medication Sig Dispense Refill  . amLODipine (NORVASC) 5 MG tablet TAKE 1 TABLET ONCE DAILY. 30 tablet 7  . ARIPiprazole (ABILIFY) 5 MG tablet Take 5 mg by mouth daily.     Marland Kitchen aspirin EC 81 MG tablet Take 81 mg by mouth 2 (two) times daily.    Marland Kitchen desvenlafaxine (PRISTIQ) 100 MG 24 hr tablet Take 100 mg by mouth daily.    .  fenofibrate (TRICOR) 145 MG tablet Take 145 mg by mouth daily.   1  . hydrocortisone butyrate (LUCOID) 0.1 % CREA cream Apply 1 application topically as needed.  2  . Ipratropium-Albuterol (COMBIVENT RESPIMAT) 20-100 MCG/ACT AERS respimat Inhale 2 puffs into the lungs every 6 (six) hours as needed for wheezing.    Javier Docker Oil 300 MG CAPS Take 300 mg by mouth daily.    Marland Kitchen lisinopril-hydrochlorothiazide (PRINZIDE,ZESTORETIC) 20-12.5 MG per tablet Take 2 tablets by mouth daily.   0  . LIVALO 1 MG TABS Take 1 tablet by mouth daily.  5  . metoprolol succinate (TOPROL-XL) 50 MG 24 hr tablet Take 50 mg by mouth daily.  4  . multivitamin-iron-minerals-folic acid (CENTRUM) chewable tablet Chew 1 tablet by mouth daily.    . NUCYNTA 75 MG TABS Take 75 mg by mouth daily as needed (one tablet every 12 hours as need for back pain).   0  . zolpidem (AMBIEN) 10 MG tablet Take 15 mg by mouth at bedtime as needed for sleep (May take one and half tablets by mouth nightly as needed for sleep).     . diazepam (VALIUM) 10 MG tablet Take 10 mg by mouth daily as needed for anxiety.    . mupirocin ointment (BACTROBAN) 2 % Apply 1 application topically as needed.  98  . Pitavastatin Calcium 4 MG TABS Take 1 tablet (4 mg total) by mouth daily. (Patient not taking: Reported on 05/11/2017) 30 tablet 5   Current Facility-Administered Medications  Medication Dose Route Frequency Provider Last Rate Last Dose  . botulinum toxin Type A (BOTOX) injection 200 Units  200 Units Intramuscular Once Drema Dallas, DO        Allergies  Allergen Reactions  . Lyrica [Pregabalin] Other (See Comments)    DRY MOUTH  . Penicillins Swelling    Childhood reaction- tongue swelled  Statin: myalgia   REVIEW OF SYSTEMS:   Cardiac:  positive for: no symptoms, negative for: Chest pain or chest pressure, Shortness of breath upon exertion and Shortness of breath when lying flat,   Vascular:  positive for: pain with walking and rest in  legs,  negative for: Blood clot in your veins and Leg swelling  Pulmonary:  positive for: no symptoms,  negative for: Oxygen at home, Productive cough and Wheezing  Neurologic:  positive for: No symptoms, negative for: Sudden weakness in arms or legs, Sudden numbness in arms or legs, Sudden onset of difficulty speaking or slurred speech, Temporary loss of vision in one eye and Problems with dizziness  Gastrointestinal:  positive for: no symptoms, negative for: Blood in stool and Vomited blood  Genitourinary:  positive for: no symptoms,  negative for: Burning when urinating and Blood in urine  Psychiatric:  positive for: no symptoms,  negative for: Major depression  Hematologic:  positive for: no symptoms,  negative for: negative for: Bleeding problems and Problems with blood clotting too easily  Dermatologic:  positive for: no symptoms, negative for: Rashes or ulcers  Constitutional:  positive for: no symptoms, negative for: Fever or chills  Ear/Nose/Throat:  positive for: no symptoms, negative for: Change in hearing, Nose bleeds and Sore throat  Musculoskeletal:  positive for: no symptoms, negative for: Back pain, Joint pain and Muscle pain   For VQI Use Only   PRE-ADM LIVING: Home  AMB STATUS: Ambulatory  CAD Sx: None  PRIOR CHF: None  STRESS TEST: No   Physical Examination   Vitals:   05/11/17 1033  BP: (!) 133/95  Pulse: (!) 108  Resp: 20  Temp: 98.6 F (37 C)  TempSrc: Oral  SpO2: 93%  Weight: 149 lb 3.2 oz (67.7 kg)  Height: 5\' 5"  (1.651 m)    Body mass index is 24.83 kg/m.  General Alert, O x 3, WD, NAD  Head [place]/AT,    Ear/Nose/Throat Hearing grossly intact, nares without erythema or drainage, oropharynx without Erythema or Exudate, Mallampati score: 3, Dentition intact  Eyes PERRLA, EOMI,    Neck Supple, mid-line trachea,    Pulmonary Sym exp, good B air movt, CTA B  Cardiac RRR, Nl S1, S2, no Murmurs, No rubs, No S3,S4    Vascular Vessel Right Left  Radial Palpable Palpable  Brachial Palpable Palpable  Carotid Palpable, No Bruit Palpable, No Bruit  Aorta Not palpable N/A  Femoral Palpable Palpable  Popliteal Not palpable Not palpable  PT Not palpable Palpable  DP Not palpable Palpable    Gastrointestinal soft, non-distended, non-tender to palpation, No guarding or rebound, no HSM, no masses, no CVAT B, No palpable prominent aortic pulse,    Musculoskeletal M/S 5/5 throughout  , Extremities without ischemic changes except markedly cyanotic R forefoot (most in R 1st, 2nd, and 5th toes), No edema present, Varicosities present: B moderate sized varicose veins, No Lipodermatosclerosis present  Neurologic Cranial nerves 2-12 intact  , Pain and light touch intact in extremities  , Motor exam as listed above  Psychiatric Judgement intact, Mood & affect appropriate for pt's clinical situation  Dermatologic See M/S exam for extremity exam, No rashes otherwise noted  Lymphatic  Palpable lymph nodes: None    Non-Invasive Vascular Imaging   ABI (05/10/17)  R:   ABI: 0.32,   PT: mono  DP: mono  TBI:  Not detectable  L:   ABI: 1.10,   PT: tri  DP: tri  TBI: 0.74   Outside Studies/Documentation   10 pages of outside documents were reviewed including: outpatient chart.   Medical Decision Making   Brenda Carney is a 66 y.o. female who presents with: RLE critical limb ischemia.   Unclear to me exact chronology of this patient's disease process, but even if embolic, her sx have been >2 months so I doubt she is a candidate for thrombolysis.  I discussed with the patient the natural history of critical limb ischemia: 25% require amputation in one year, 50% are able to maintain their limbs in one year, and 25-30% die in one year due to comorbidities.  Given the limb threatening status of this patient, I recommend an aggressive work up including proceeding with an: Aortogram, Bilateral runoff and  possible intervention right leg. I discussed with the patient  the nature of angiographic procedures, especially the limited patencies of any endovascular intervention. The patient is aware of that the risks of an angiographic procedure include but are not limited to: bleeding, infection, access site complications, embolization, rupture of treated vessel, dissection, possible need for emergent surgical intervention, and possible need for surgical procedures to treat the patient's pathology. The patient is aware of the risks and agrees to proceed.  The procedure is scheduled for: 21 MAY 18.  I discussed in depth with the patient the nature of atherosclerosis, and emphasized the importance of maximal medical management including strict control of blood pressure, blood glucose, and lipid levels, antiplatelet agents, obtaining regular exercise, and cessation of smoking.  The patient is aware that without maximal medical management the underlying atherosclerotic disease process will progress, limiting the benefit of any interventions. The patient is currently not on a statin due to reported allergy.  The patient is currently on an anti-platelet: ASA.  Thank you for allowing [place] to participate in this patient's care.   Adele Barthel, MD, FACS [facility] Office: [phone] Pager: [phone]  05/11/2017, 11:00 AM

## 2017-05-14 ENCOUNTER — Encounter (HOSPITAL_COMMUNITY): Payer: Self-pay | Admitting: General Practice

## 2017-05-14 ENCOUNTER — Inpatient Hospital Stay (HOSPITAL_COMMUNITY): Payer: Medicare Other

## 2017-05-14 ENCOUNTER — Inpatient Hospital Stay (HOSPITAL_COMMUNITY)
Admission: RE | Admit: 2017-05-14 | Discharge: 2017-05-18 | DRG: 303 | Disposition: A | Discharge status: Home / Self Care | Payer: Medicare Other | Source: Ambulatory Visit | Attending: Vascular Surgery | Admitting: Vascular Surgery

## 2017-05-14 ENCOUNTER — Encounter (HOSPITAL_COMMUNITY)
Admission: RE | Disposition: A | Discharge status: Home / Self Care | Payer: Self-pay | Source: Ambulatory Visit | Attending: Vascular Surgery

## 2017-05-14 DIAGNOSIS — I998 Other disorder of circulatory system: Principal | ICD-10-CM | POA: Diagnosis present

## 2017-05-14 DIAGNOSIS — Z7982 Long term (current) use of aspirin: Secondary | ICD-10-CM | POA: Diagnosis not present

## 2017-05-14 DIAGNOSIS — I10 Essential (primary) hypertension: Secondary | ICD-10-CM

## 2017-05-14 DIAGNOSIS — Z01810 Encounter for preprocedural cardiovascular examination: Secondary | ICD-10-CM | POA: Diagnosis not present

## 2017-05-14 DIAGNOSIS — K219 Gastro-esophageal reflux disease without esophagitis: Secondary | ICD-10-CM | POA: Diagnosis present

## 2017-05-14 DIAGNOSIS — Z79899 Other long term (current) drug therapy: Secondary | ICD-10-CM | POA: Diagnosis not present

## 2017-05-14 DIAGNOSIS — J440 Chronic obstructive pulmonary disease with acute lower respiratory infection: Secondary | ICD-10-CM | POA: Diagnosis not present

## 2017-05-14 DIAGNOSIS — I70229 Atherosclerosis of native arteries of extremities with rest pain, unspecified extremity: Secondary | ICD-10-CM | POA: Diagnosis present

## 2017-05-14 DIAGNOSIS — I743 Embolism and thrombosis of arteries of the lower extremities: Secondary | ICD-10-CM | POA: Diagnosis not present

## 2017-05-14 DIAGNOSIS — E785 Hyperlipidemia, unspecified: Secondary | ICD-10-CM

## 2017-05-14 DIAGNOSIS — I739 Peripheral vascular disease, unspecified: Secondary | ICD-10-CM

## 2017-05-14 DIAGNOSIS — Z8673 Personal history of transient ischemic attack (TIA), and cerebral infarction without residual deficits: Secondary | ICD-10-CM | POA: Diagnosis not present

## 2017-05-14 DIAGNOSIS — Z9889 Other specified postprocedural states: Secondary | ICD-10-CM | POA: Diagnosis not present

## 2017-05-14 HISTORY — DX: Chronic obstructive pulmonary disease, unspecified: J44.9

## 2017-05-14 HISTORY — DX: Peripheral vascular disease, unspecified: I73.9

## 2017-05-14 HISTORY — DX: Low back pain, unspecified: M54.50

## 2017-05-14 HISTORY — DX: Low back pain: M54.5

## 2017-05-14 HISTORY — PX: ABDOMINAL AORTOGRAM W/LOWER EXTREMITY: CATH118223

## 2017-05-14 HISTORY — DX: Other chronic pain: G89.29

## 2017-05-14 HISTORY — DX: Personal history of urinary calculi: Z87.442

## 2017-05-14 LAB — POCT I-STAT, CHEM 8
BUN: 11 mg/dL (ref 6–20)
Calcium, Ion: 1.15 mmol/L (ref 1.15–1.40)
Chloride: 99 mmol/L — ABNORMAL LOW (ref 101–111)
Creatinine, Ser: 0.8 mg/dL (ref 0.44–1.00)
Glucose, Bld: 98 mg/dL (ref 65–99)
HCT: 48 % — ABNORMAL HIGH (ref 36.0–46.0)
Hemoglobin: 16.3 g/dL — ABNORMAL HIGH (ref 12.0–15.0)
Potassium: 3.6 mmol/L (ref 3.5–5.1)
Sodium: 138 mmol/L (ref 135–145)
TCO2: 31 mmol/L (ref 0–100)

## 2017-05-14 LAB — CBC
HCT: 42.3 % (ref 36.0–46.0)
Hemoglobin: 14.6 g/dL (ref 12.0–15.0)
MCH: 33.8 pg (ref 26.0–34.0)
MCHC: 34.5 g/dL (ref 30.0–36.0)
MCV: 97.9 fL (ref 78.0–100.0)
Platelets: 314 10*3/uL (ref 150–400)
RBC: 4.32 MIL/uL (ref 3.87–5.11)
RDW: 13.2 % (ref 11.5–15.5)
WBC: 7.7 10*3/uL (ref 4.0–10.5)

## 2017-05-14 LAB — TYPE AND SCREEN
ABO/RH(D): A POS
Antibody Screen: NEGATIVE

## 2017-05-14 LAB — CREATININE, SERUM
Creatinine, Ser: 0.71 mg/dL (ref 0.44–1.00)
GFR calc Af Amer: >60 mL/min (ref >60)
GFR calc non Af Amer: >60 mL/min (ref >60)

## 2017-05-14 SURGERY — ABDOMINAL AORTOGRAM W/LOWER EXTREMITY
Anesthesia: LOCAL

## 2017-05-14 MED ORDER — MIDAZOLAM HCL 2 MG/2ML IJ SOLN
INTRAMUSCULAR | Status: DC | PRN
  Administered 2017-05-14: 1 mg via INTRAVENOUS

## 2017-05-14 MED ORDER — OXYCODONE-ACETAMINOPHEN 5-325 MG PO TABS
1.0000 | ORAL_TABLET | ORAL | Status: DC | PRN
  Administered 2017-05-14 – 2017-05-18 (×9): 2 via ORAL
  Filled 2017-05-14 (×9): qty 2

## 2017-05-14 MED ORDER — MORPHINE SULFATE (PF) 4 MG/ML IV SOLN
INTRAVENOUS | Status: AC
  Filled 2017-05-14: qty 1

## 2017-05-14 MED ORDER — MUPIROCIN 2 % EX OINT
1.0000 "application " | TOPICAL_OINTMENT | CUTANEOUS | Status: DC | PRN
  Filled 2017-05-14: qty 22

## 2017-05-14 MED ORDER — VENLAFAXINE HCL ER 37.5 MG PO CP24
37.5000 mg | ORAL_CAPSULE | Freq: Every day | ORAL | Status: DC
  Administered 2017-05-16 – 2017-05-18 (×3): 37.5 mg via ORAL
  Filled 2017-05-14 (×4): qty 1

## 2017-05-14 MED ORDER — IODIXANOL 320 MG/ML IV SOLN
INTRAVENOUS | Status: DC | PRN
  Administered 2017-05-14: 120 mL via INTRAVENOUS

## 2017-05-14 MED ORDER — PRAVASTATIN SODIUM 20 MG PO TABS
20.0000 mg | ORAL_TABLET | Freq: Every day | ORAL | Status: DC
  Administered 2017-05-14 – 2017-05-17 (×4): 20 mg via ORAL
  Filled 2017-05-14 (×4): qty 1

## 2017-05-14 MED ORDER — FENOFIBRATE 160 MG PO TABS
160.0000 mg | ORAL_TABLET | Freq: Every day | ORAL | Status: DC
  Administered 2017-05-16 – 2017-05-17 (×2): 160 mg via ORAL
  Filled 2017-05-14 (×3): qty 1

## 2017-05-14 MED ORDER — LISINOPRIL 10 MG PO TABS
20.0000 mg | ORAL_TABLET | Freq: Every day | ORAL | Status: DC
  Administered 2017-05-16 – 2017-05-17 (×2): 20 mg via ORAL
  Filled 2017-05-14: qty 2
  Filled 2017-05-14: qty 1
  Filled 2017-05-14: qty 2

## 2017-05-14 MED ORDER — FENTANYL CITRATE (PF) 100 MCG/2ML IJ SOLN
INTRAMUSCULAR | Status: AC
  Filled 2017-05-14: qty 2

## 2017-05-14 MED ORDER — FLUTICASONE PROPIONATE 50 MCG/ACT NA SUSP
1.0000 | Freq: Every day | NASAL | Status: DC
  Administered 2017-05-14 – 2017-05-17 (×3): 1 via NASAL
  Filled 2017-05-14 (×2): qty 16

## 2017-05-14 MED ORDER — LISINOPRIL-HYDROCHLOROTHIAZIDE 20-12.5 MG PO TABS: 2.0000 | ORAL_TABLET | Freq: Every day | ORAL | Status: DC

## 2017-05-14 MED ORDER — IPRATROPIUM-ALBUTEROL 0.5-2.5 (3) MG/3ML IN SOLN: 3.0000 mL | Freq: Four times a day (QID) | RESPIRATORY_TRACT | Status: DC | PRN

## 2017-05-14 MED ORDER — AMLODIPINE BESYLATE 5 MG PO TABS
5.0000 mg | ORAL_TABLET | Freq: Every day | ORAL | Status: DC
  Administered 2017-05-16 – 2017-05-17 (×2): 5 mg via ORAL
  Filled 2017-05-14 (×3): qty 1

## 2017-05-14 MED ORDER — ADULT MULTIVITAMIN W/MINERALS CH
1.0000 | ORAL_TABLET | Freq: Every day | ORAL | Status: DC
  Administered 2017-05-16 – 2017-05-17 (×2): 1 via ORAL
  Filled 2017-05-14 (×3): qty 1

## 2017-05-14 MED ORDER — LIDOCAINE HCL 1 % IJ SOLN
INTRAMUSCULAR | Status: AC
  Filled 2017-05-14: qty 20

## 2017-05-14 MED ORDER — LIDOCAINE HCL (PF) 1 % IJ SOLN
INTRAMUSCULAR | Status: DC | PRN
  Administered 2017-05-14: 12 mL

## 2017-05-14 MED ORDER — SODIUM CHLORIDE 0.9 % IV SOLN: 1.0000 mL/kg/h | INTRAVENOUS | Status: AC

## 2017-05-14 MED ORDER — ASPIRIN EC 81 MG PO TBEC
81.0000 mg | DELAYED_RELEASE_TABLET | Freq: Two times a day (BID) | ORAL | Status: DC
  Administered 2017-05-14 – 2017-05-17 (×6): 81 mg via ORAL
  Filled 2017-05-14 (×7): qty 1

## 2017-05-14 MED ORDER — POTASSIUM CHLORIDE CRYS ER 10 MEQ PO TBCR: 10.0000 meq | EXTENDED_RELEASE_TABLET | Freq: Every day | ORAL | Status: DC

## 2017-05-14 MED ORDER — VANCOMYCIN HCL IN DEXTROSE 1-5 GM/200ML-% IV SOLN
1000.0000 mg | INTRAVENOUS | Status: DC
  Filled 2017-05-14 (×2): qty 200

## 2017-05-14 MED ORDER — ACETAMINOPHEN 325 MG PO TABS: 650.0000 mg | ORAL_TABLET | ORAL | Status: DC | PRN

## 2017-05-14 MED ORDER — ENOXAPARIN SODIUM 40 MG/0.4ML ~~LOC~~ SOLN: 40.0000 mg | SUBCUTANEOUS | Status: DC

## 2017-05-14 MED ORDER — SODIUM CHLORIDE 0.9 % IV SOLN
INTRAVENOUS | Status: DC
  Administered 2017-05-14: 11:00:00 via INTRAVENOUS

## 2017-05-14 MED ORDER — HEPARIN (PORCINE) IN NACL 2-0.9 UNIT/ML-% IJ SOLN
INTRAMUSCULAR | Status: AC | PRN
  Administered 2017-05-14: 1000 mL

## 2017-05-14 MED ORDER — ONDANSETRON HCL 4 MG/2ML IJ SOLN: 4.0000 mg | Freq: Four times a day (QID) | INTRAMUSCULAR | Status: DC | PRN

## 2017-05-14 MED ORDER — HEPARIN (PORCINE) IN NACL 2-0.9 UNIT/ML-% IJ SOLN
INTRAMUSCULAR | Status: AC
  Filled 2017-05-14: qty 1000

## 2017-05-14 MED ORDER — MIDAZOLAM HCL 2 MG/2ML IJ SOLN
INTRAMUSCULAR | Status: AC
  Filled 2017-05-14: qty 2

## 2017-05-14 MED ORDER — HYDROCHLOROTHIAZIDE 12.5 MG PO CAPS
12.5000 mg | ORAL_CAPSULE | Freq: Every day | ORAL | Status: DC
  Filled 2017-05-14 (×3): qty 1

## 2017-05-14 MED ORDER — MORPHINE SULFATE (PF) 4 MG/ML IV SOLN
2.0000 mg | INTRAVENOUS | Status: DC | PRN
  Administered 2017-05-14: 2 mg via INTRAVENOUS

## 2017-05-14 MED ORDER — ZOLPIDEM TARTRATE 5 MG PO TABS
5.0000 mg | ORAL_TABLET | Freq: Every day | ORAL | Status: DC
  Administered 2017-05-14 – 2017-05-17 (×4): 5 mg via ORAL
  Filled 2017-05-14 (×4): qty 1

## 2017-05-14 MED ORDER — FENTANYL CITRATE (PF) 100 MCG/2ML IJ SOLN
INTRAMUSCULAR | Status: DC | PRN
  Administered 2017-05-14: 50 ug via INTRAVENOUS

## 2017-05-14 SURGICAL SUPPLY — 11 items
CATH OMNI FLUSH 5F 65CM (CATHETERS) ×2 IMPLANT
CATH STRAIGHT 5FR 65CM (CATHETERS) ×2 IMPLANT
COVER PRB 48X5XTLSCP FOLD TPE (BAG) ×3 IMPLANT
COVER PROBE 5X48 (BAG) ×3
KIT MICROINTRODUCER STIFF 5F (SHEATH) ×2 IMPLANT
KIT PV (KITS) ×2 IMPLANT
SHEATH PINNACLE 5F 10CM (SHEATH) ×2 IMPLANT
SYR MEDRAD MARK V 150ML (SYRINGE) ×2 IMPLANT
TRANSDUCER W/STOPCOCK (MISCELLANEOUS) ×2 IMPLANT
TRAY PV CATH (CUSTOM PROCEDURE TRAY) ×2 IMPLANT
WIRE BENTSON .035X145CM (WIRE) ×2 IMPLANT

## 2017-05-14 NOTE — Consult Note (Signed)
Cardiology Consultation:   Patient ID: Brenda Carney; [id number]; March 20, 1951   Admit date: 05/14/2017 Date of Consult: 05/14/2017  Primary Care Provider: Leeroy Cha, MD Primary Cardiologist: Dr. Ellyn Hack 04-18-2015) Primary Electrophysiologist:  None   Patient Profile:   Brenda Carney is a 66 y.o. female with a hx of HTN, active smoking, cerebral aneurysm, CVA, headache,  hyperlipidemia, sleep apnea- doe snot wear CPAP, TIA  who is being seen today for the evaluation of right foot ischemia at the request of Dr. Bridgett Larsson.  History of Present Illness:   Brenda Carney presented to Dr. Bridgett Larsson with progressive darkness in the right toes. She started with right foot pain that started approximately one year ago. It has been progressively getting work over the past few months. Today at a aortogram, with bilateral runoff and possible intervention of right leg was done. Dr. Bridgett Larsson felt that the patient would be better served with a right above-the-knee popliteal to below-the-knee popliteal bypass than attempt an endovascular intervention on the occluded right popliteal artery. Patient admitted to vascular and vein specialists. They request [place] to give cardiac risk stratification and optimization. Plan is to schedule patient for tomorrow given concern for possible imminent development of right toe gangrene.  She last saw Dr. Ellyn Hack in he office in 2015-04-18, her father died in his 69s and had severe PAD and multiple vascular surgeries and amputations starting at a young age. No known coronary artery disease. Myoview 04/17/2008 was negative.  Her father died of an MI when he was in his late 62's. She denies ever having problems with chest pain, shortness of breath, palpitations or orthopnea.   Past Medical History:  Diagnosis Date  . Arthritis   . 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 Cerebrovascular disease    Carotid dopplers which  showed no significant increase in velocities, extremely minimal on the left, antegrade vertebral bilaterally.  . Cervical spondylosis   . Exercise-induced asthma with acute exacerbation    no recen exacerbation  . GERD (gastroesophageal reflux disease)   . Headache(784.0) 10/07/2013  . Heart murmur    Echo 08/2013: Suboptimal imaging. EF roughly 55%. Grade 1 diastolic dysfunction.  Marland Kitchen History of IBS    resovlved  . History of stress test 03/2008   the post stress myocardial perfusion images show a normal pattern of perfusion in all regions. The post ventricle is normal in size. There is no scintigraphic evidence of inducible myocardial ichemia. There is prominent gut uptake activity noted in the infero-apical region.  Marland Kitchen Hx of multiple concussions   . Hyperlipidemia with target LDL less than 100   . Hypertension, benign   . Lumbosacral spondylosis   . Migraine headache   . Pneumonia   . Sleep apnea    does not wear CPAP  . TIA (transient ischemic attack) 09/2011    Past Surgical History:  Procedure Laterality Date  . FOOT FRACTURE SURGERY     Multiple procedures, bilateral feet  . 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 left a.c. joint surgery  1990s  . MAXIMUM ACCESS (MAS)POSTERIOR LUMBAR INTERBODY FUSION (PLIF) 1 LEVEL N/A 09/30/2014   Procedure: FOR MAXIMUM ACCESS (MAS) POSTERIOR LUMBAR INTERBODY FUSION (PLIF) 1 LEVEL;  Surgeon: Eustace Moore, MD;  Location: [facility];  Service: Neurosurgery;  Laterality: N/A;  FOR MAXIMUM ACCESS (MAS) POSTERIOR LUMBAR INTERBODY FUSION (PLIF) 1  LEVEL LUMBAR 4-5     Inpatient Medications: Scheduled Meds: . [START ON 05/15/2017] amLODipine  5 mg Oral Daily  . aspirin EC  81 mg Oral BID  . [START ON 05/15/2017] enoxaparin (LOVENOX) injection  40 mg Subcutaneous Q24H  . [START ON 05/15/2017] fenofibrate  160 mg Oral Daily  . fluticasone  1 spray Each Nare Daily  . [START ON 05/15/2017] lisinopril  20 mg Oral  Daily   Or  . [START ON 05/15/2017] hydrochlorothiazide  12.5 mg Oral Daily  . [START ON 05/15/2017] multivitamin with minerals  1 tablet Oral Daily  . [START ON 05/15/2017] potassium chloride  10 mEq Oral Daily  . pravastatin  20 mg Oral q1800  . [START ON 05/15/2017] venlafaxine XR  37.5 mg Oral Q breakfast  . zolpidem  5 mg Oral QHS   Continuous Infusions: . sodium chloride 1 mL/kg/hr (05/14/17 1256)  . [START ON 05/15/2017] vancomycin     PRN Meds: acetaminophen, ipratropium-albuterol, morphine injection, mupirocin ointment, ondansetron (ZOFRAN) IV, oxyCODONE-acetaminophen  Allergies:    Allergies  Allergen Reactions  . Lyrica [Pregabalin] Other (See Comments)    DRY MOUTH  . Penicillins Swelling    Childhood reaction- tongue swelled Has patient had a PCN reaction causing immediate rash, facial/tongue/throat swelling, SOB or lightheadedness with hypotension: Yes Has patient had a PCN reaction causing severe rash involving mucus membranes or skin necrosis: No Has patient had a PCN reaction that required hospitalization: No Has patient had a PCN reaction occurring within the last 10 years: No If all of the above answers are "NO", then may proceed with Cephalosporin use.     Social History:   Social History   Social History  . Marital status: Widowed    Spouse name: [name]   . Number of children: 0  . Years of education: COLLEGE   Occupational History  . Horse Breeder     Social History Main Topics  . Smoking status: Current Some Day Smoker    Types: Cigarettes    Last attempt to quit: 06/09/2010  . Smokeless tobacco: Never Used  . Alcohol use 0.0 oz/week     Comment: ocassional wine  . Drug use: No  . Sexual activity: Not on file   Other Topics Concern  . Not on file   Social History Narrative   Patient lives at home with her husband [name]. Patient has no children.    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    Patient has BA degree.     Smokes 2-3 cigarettes /day -- former heavy smoker (failed "quitting") -- now says she has truly "QUIT"    Family History:   The patient's family history includes Heart attack in her mother; Stroke in her father and mother.  ROS:  Please see the history of present illness.  All other ROS reviewed and negative.     Physical Exam/Data:   Vitals:   05/14/17 1525 05/14/17 1530 05/14/17 1535 05/14/17 1606  BP: (!) 173/99 (!) 162/93  114/81  Pulse: 87 88 85 94  Resp: 20 19 19 20   Temp:    98.5 F (36.9 C)  TempSrc:    Oral  SpO2: 94% 97% 94% 99%  Weight:      Height:       No intake or output data in the 24 hours ending 05/14/17 1610 Filed Weights   05/14/17 1003  Weight: 148 lb (67.1 kg)   Body mass index is  24.63 kg/m.  General: Well developed, well nourished, in no acute distress. Head: Normocephalic, atraumatic, sclera non-icteric, no xanthomas, nares are without discharge.  Neck: Negative for carotid bruits. JVD not elevated. Lungs: Clear bilaterally to auscultation without wheezes, rales, or rhonchi. Breathing is unlabored. Heart: RRR with S1 S2. No murmurs, rubs, or gallops appreciated. Abdomen: Soft, non-tender, non-distended with normoactive bowel sounds. No hepatomegaly. No rebound/guarding. No obvious abdominal masses. Msk:  Strength and tone appear normal for age, dusky changes to right foot Extremities: No clubbing or cyanosis. No edema.  Distal pedal pulses are 2+ and equal bilaterally. Neuro: Alert and oriented X 3. No facial asymmetry. No focal deficit. Moves all extremities spontaneously. Psych:  Responds to questions appropriately with a normal affect.  EKG:  The EKG was personally reviewed and demonstrates HR 84, NSR  Relevant CV Studies:  Transthoracic Echocardiography 08/2013  Study Conclusions  - Procedure narrative: Transthoracic echocardiography. Technically difficult study with suboptimal image quality. - Left ventricle: The cavity size was  normal. Wall thickness was normal. Systolic function was normal. The estimated ejection fraction was in the range of 55% to 60%. Doppler parameters are consistent with abnormal left ventricular relaxation (grade 1 diastolic dysfunction). The E/e' ratio is ~10, suggesting borderline increased LV filling pressure. - Mitral valve: Calcified annulus. Mildly thickened leaflets . Trivial regurgitation. - Left atrium: The atrium was mildly dilated. Volume 32 ml/m2. - Atrial septum: Appears thickened. No defect or patent foramen ovale was identified by color doppler. - Inferior vena cava: The vessel was normal in size; the respirophasic diameter changes were in the normal range (= 50%); findings are consistent with normal central venous pressure.   Laboratory Data:  Chemistry Recent Labs Lab 05/14/17 1041  NA 138  K 3.6  CL 99*  GLUCOSE 98  BUN 11  CREATININE 0.80    Hematology Recent Labs Lab 05/14/17 1041  HGB 16.3*  HCT 48.0*    Radiology/Studies:  No results found.  Assessment and Plan:   Critical lower limb ischemia: The surgery is a high risk vascular surgery. The patient is without known ischemic heart disaese or congestive heart failure but does have known cerebral vascular surgery.  She is not a diabetic and her creatinine is  0.80. She does not have a recent echo or any ischemic work-up. Due to the nature of this surgery we recommend proceeding with the surgery, pt low-moderate.  Hyperlipidemia: no current lipid panel,  -- Recommend repeat for optimization of medication. Currently taking Pravastatin of 20 mg daily. If not at goal LDL < 70, then would recommend high potency statin such as atorvastatin or rosuvastatin.   Hypertension: variable blood pressure readings, 137/88 to 173/99.  -- Continue Norvasc and Lisinopril/HCTZ -- Consider perioperative beta blocker if not thought to be at risk for worsening critical limb ischemia.     Kristopher Glee, PA-C  05/14/2017 4:10 PM

## 2017-05-14 NOTE — Interval H&P Note (Signed)
History and Physical Interval Note:  05/14/2017 10:34 AM  Brenda Carney  has presented today for surgery, with the diagnosis of pad  The various methods of treatment have been discussed with the patient and family. After consideration of risks, benefits and other options for treatment, the patient has consented to  Procedure(s): Abdominal Aortogram w/Lower Extremity (N/A) as a surgical intervention .  The patient's history has been reviewed, patient examined, no change in status, stable for surgery.  I have reviewed the patient's chart and labs.  Questions were answered to the patient's satisfaction.     Adele Barthel

## 2017-05-14 NOTE — Progress Notes (Addendum)
Site area: LFA Site Prior to Removal:  Level 0 Pressure Applied For:25 min Manual:  yes  Patient Status During Pull: stable  Post Pull Site:  Level 0 Post Pull Instructions Given: yes  Post Pull Pulses Present: palpable Dressing Applied:  tegaderm Bedrest begins @ 1330 [id number] Comments:

## 2017-05-14 NOTE — Op Note (Addendum)
OPERATIVE NOTE   PROCEDURE: 1.  Left common femoral artery cannulation under ultrasound guidance 2.  Placement of catheter in aorta 3.  Aortogram 4.  Second order arterial selection 5.  Right leg runoff via catheter 6.  Left leg runoff via sheath 7.  Conscious sedation for 30 minutes  PRE-OPERATIVE DIAGNOSIS: right foot critical limb ischemia   POST-OPERATIVE DIAGNOSIS: same as above   SURGEON: Adele Barthel, MD  ANESTHESIA: conscious sedation  ESTIMATED BLOOD LOSS: 30 cc  CONTRAST: 120 cc  FINDING(S):  Aorta: patent, small penetrating aortic ulcer infrarenal segment  Superior mesenteric artery: no well visualized due to spine hardware Celiac artery: patent   Right Left  RA Patent, patent accessory, patent nephrogram Patent, patent nephrogram  CIA Patent, calcified with 30% stenosis distally Patent, calcified  EIA Patent with minimal calcific disease Patent with minimal calcific disease  IIA patent Patent, >90% stenosis shortly after takeoff  CFA patent patent  SFA Patent, diffusely diseased with stenoses <30% Patent  PFA Patent Patent  Pop Patent proximally, occluded just above knee, hypertrophied medial geniculate and extensive collaterals reconstitutes below the knee segment Patent  Trif Patent Patent  AT Patent Patent  Pero Patent proximally, attenuates distally Patent  PT Patent, fills from medial geniculate and also fills in delayed fashion from tibioperoneal trunk due to popliteal occlusion Patent   SPECIMEN(S):  none  INDICATIONS:   Brenda Carney is a 66 y.o. female who presents with R leg critical limb ischemia on ABI and ischemic appearing right toes.  The patient presents for: aortogram, bilateral leg runoff, and possible intervention.  I discussed with the patient the nature of angiographic procedures, especially the limited patencies of any endovascular intervention.  The patient is aware of that the risks of an angiographic procedure include but are  not limited to: bleeding, infection, access site complications, renal failure, embolization, rupture of vessel, dissection, possible need for emergent surgical intervention, possible need for surgical procedures to treat the patient's pathology, and stroke and death.  The patient is aware of the risks and agrees to proceed.  DESCRIPTION: After full informed consent was obtained from the patient, the patient was brought back to the angiography suite.  The patient was placed supine upon the angiography table and connected to cardiopulmonary monitoring equipment.  The patient was then given conscious sedation, the amounts of which are documented in the patient's chart.  A circulating radiologic technician maintained continuous monitoring of the patient's cardiopulmonary status.  Additionally, the control room radiologic technician provided backup monitoring throughout the procedure.  The patient was prepped and drape in the standard fashion for an angiographic procedure.  At this point, attention was turned to the left groin.  Under ultrasound guidance, the subcutaneous tissue surrounding the left common femoral artery was anesthesized with 1% lidocaine with epinephrine.  The artery was then cannulated with a micropuncture needle.  The microwire was advanced into the iliac arterial system.  The needle was exchanged for a microsheath, which was loaded into the common femoral artery over the wire.  The microwire was exchanged for a Bentson wire which was advanced into the aorta.  The microsheath was then exchanged for a 5-Fr sheath which was loaded into the common femoral artery.  The Omniflush catheter was then loaded over the wire up to the level of L1.  The catheter was connected to the power injector circuit.  After de-airring and de-clotting the circuit, a power injector aortogram was completed.  I pulled the catheter  down to distal aorta and repeated a LAO pelvic injection to better evaluate the right iliac  arterial system given the spinal hardware present.  The findings are listed above.  Using a Bentson wire and Omniflush catheter, the right common iliac artery was selected.  The wire advanced into the external iliac artery but the catheter caught on the calcific iliac disease.  I loaded a straight catheter over the wire into the right external iliac artery.  The wire was removed and then the catheter was connected to the power injector circuit.  An automated right leg runoff was completed.  The findings are listed above.    At this point, I pulled out the catheter.  The sheath was aspirated.  No clots were present and the sheath was reloaded with heparinized saline.  The left femoral sheath was connected to the power injector.  An automated left leg runoff was completed.   The findings are listed above.  Based on the images, I feel this patient is better served with a right above-the-knee popliteal to below-the-knee popliteal bypass than attempt an endovascular intervention on the occluded right popliteal artery, which would be at risk for dissection into the below-the-knee popliteal artery.  Will plan on admitting to expedite work-up including: cardiac risk stratification and optimization, bilateral greater saphenous vein mapping, and Anesthesia preoperative evaluation.  Will try to schedule patient for tomorrow given the concern for possible imminent development of right toe gangrene.   COMPLICATIONS: none  CONDITION: stable   Adele Barthel, MD, [facility] [place] Office: [phone] Pager: [phone]  05/14/2017, 12:46 PM

## 2017-05-14 NOTE — H&P (View-Only) (Signed)
Referred by:  Lottie Dawson, MD [facility]  [address] [place], [place] [address]  Reason for referral: right foot ischemia   History of Present Illness   Brenda Carney is a 66 y.o. (19-Feb-1951) female who presents with chief complaint: right foot pain.  Onset of symptom occurred over one year ago, some pain in right foot with walking.  Over last two months, pt has had severe cramping in R calf that she thought was due to a calf muscle injury related to her running.  Pain is described as cramping, severity 5-10/10, and associated with ambulation and rest.  Patient has attempted to treat this pain with rest.  The patient has no rest pain symptoms also and progressing darkness in right toes.  Atherosclerotic risk factors include: HTN, active smoking.  Past Medical History:  Diagnosis Date  . Arthritis   . 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 Cerebrovascular disease    Carotid dopplers which showed no significant increase in velocities, extremely minimal on the left, antegrade vertebral bilaterally.  . Cervical spondylosis   . Exercise-induced asthma with acute exacerbation    no recen exacerbation  . GERD (gastroesophageal reflux disease)   . Headache(784.0) 10/07/2013  . Heart murmur    Echo 08/2013: Suboptimal imaging. EF roughly 55%. Grade 1 diastolic dysfunction.  Marland Kitchen History of IBS    resovlved  . History of stress test 03/2008   the post stress myocardial perfusion images show a normal pattern of perfusion in all regions. The post ventricle is normal in size. There is no scintigraphic evidence of inducible myocardial ichemia. There is prominent gut uptake activity noted in the infero-apical region.  Marland Kitchen Hx of multiple concussions   . Hyperlipidemia with target LDL less than 100   . Hypertension, benign   . Lumbosacral spondylosis   . Migraine headache   . Pneumonia   . Sleep apnea    does not  wear CPAP  . TIA (transient ischemic attack) 09/2011    Past Surgical History:  Procedure Laterality Date  . FOOT FRACTURE SURGERY     Multiple procedures, bilateral feet  . 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 left a.c. joint surgery  1990s  . MAXIMUM ACCESS (MAS)POSTERIOR LUMBAR INTERBODY FUSION (PLIF) 1 LEVEL N/A 09/30/2014   Procedure: FOR MAXIMUM ACCESS (MAS) POSTERIOR LUMBAR INTERBODY FUSION (PLIF) 1 LEVEL;  Surgeon: Eustace Moore, MD;  Location: [facility];  Service: Neurosurgery;  Laterality: N/A;  FOR MAXIMUM ACCESS (MAS) POSTERIOR LUMBAR INTERBODY FUSION (PLIF) 1 LEVEL LUMBAR 4-5    Social History   Social History  . Marital status: Widowed    Spouse name: [name]   . Number of children: 0  . Years of education: COLLEGE   Occupational History  . Horse Breeder     Social History Main Topics  . Smoking status: Current Some Day Smoker    Types: Cigarettes    Last attempt to quit: 06/09/2010  . Smokeless tobacco: Never Used  . Alcohol use 0.0 oz/week     Comment: ocassional wine  . Drug use: No  . Sexual activity: Not on file   Other Topics Concern  . Not on file   Social History Narrative   Patient lives at home with her husband [name]. Patient has no children.    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    Patient has BA degree.    Smokes 2-3 cigarettes /day -- former heavy smoker (failed "quitting") -- now says she has truly "QUIT"    Family History  Problem Relation Age of Onset  . Stroke Father   . Heart attack Mother   . Stroke Mother     Current Outpatient Prescriptions  Medication Sig Dispense Refill  . amLODipine (NORVASC) 5 MG tablet TAKE 1 TABLET ONCE DAILY. 30 tablet 7  . ARIPiprazole (ABILIFY) 5 MG tablet Take 5 mg by mouth daily.     Marland Kitchen aspirin EC 81 MG tablet Take 81 mg by mouth 2 (two) times daily.    Marland Kitchen desvenlafaxine (PRISTIQ) 100 MG 24 hr tablet Take 100 mg by mouth daily.    .  fenofibrate (TRICOR) 145 MG tablet Take 145 mg by mouth daily.   1  . hydrocortisone butyrate (LUCOID) 0.1 % CREA cream Apply 1 application topically as needed.  2  . Ipratropium-Albuterol (COMBIVENT RESPIMAT) 20-100 MCG/ACT AERS respimat Inhale 2 puffs into the lungs every 6 (six) hours as needed for wheezing.    Javier Docker Oil 300 MG CAPS Take 300 mg by mouth daily.    Marland Kitchen lisinopril-hydrochlorothiazide (PRINZIDE,ZESTORETIC) 20-12.5 MG per tablet Take 2 tablets by mouth daily.   0  . LIVALO 1 MG TABS Take 1 tablet by mouth daily.  5  . metoprolol succinate (TOPROL-XL) 50 MG 24 hr tablet Take 50 mg by mouth daily.  4  . multivitamin-iron-minerals-folic acid (CENTRUM) chewable tablet Chew 1 tablet by mouth daily.    . NUCYNTA 75 MG TABS Take 75 mg by mouth daily as needed (one tablet every 12 hours as need for back pain).   0  . zolpidem (AMBIEN) 10 MG tablet Take 15 mg by mouth at bedtime as needed for sleep (May take one and half tablets by mouth nightly as needed for sleep).     . diazepam (VALIUM) 10 MG tablet Take 10 mg by mouth daily as needed for anxiety.    . mupirocin ointment (BACTROBAN) 2 % Apply 1 application topically as needed.  98  . Pitavastatin Calcium 4 MG TABS Take 1 tablet (4 mg total) by mouth daily. (Patient not taking: Reported on 05/11/2017) 30 tablet 5   Current Facility-Administered Medications  Medication Dose Route Frequency Provider Last Rate Last Dose  . botulinum toxin Type A (BOTOX) injection 200 Units  200 Units Intramuscular Once Drema Dallas, DO        Allergies  Allergen Reactions  . Lyrica [Pregabalin] Other (See Comments)    DRY MOUTH  . Penicillins Swelling    Childhood reaction- tongue swelled  Statin: myalgia   REVIEW OF SYSTEMS:   Cardiac:  positive for: no symptoms, negative for: Chest pain or chest pressure, Shortness of breath upon exertion and Shortness of breath when lying flat,   Vascular:  positive for: pain with walking and rest in  legs,  negative for: Blood clot in your veins and Leg swelling  Pulmonary:  positive for: no symptoms,  negative for: Oxygen at home, Productive cough and Wheezing  Neurologic:  positive for: No symptoms, negative for: Sudden weakness in arms or legs, Sudden numbness in arms or legs, Sudden onset of difficulty speaking or slurred speech, Temporary loss of vision in one eye and Problems with dizziness  Gastrointestinal:  positive for: no symptoms, negative for: Blood in stool and Vomited blood  Genitourinary:  positive for: no symptoms,  negative for: Burning when urinating and Blood in urine  Psychiatric:  positive for: no symptoms,  negative for: Major depression  Hematologic:  positive for: no symptoms,  negative for: negative for: Bleeding problems and Problems with blood clotting too easily  Dermatologic:  positive for: no symptoms, negative for: Rashes or ulcers  Constitutional:  positive for: no symptoms, negative for: Fever or chills  Ear/Nose/Throat:  positive for: no symptoms, negative for: Change in hearing, Nose bleeds and Sore throat  Musculoskeletal:  positive for: no symptoms, negative for: Back pain, Joint pain and Muscle pain   For VQI Use Only   PRE-ADM LIVING: Home  AMB STATUS: Ambulatory  CAD Sx: None  PRIOR CHF: None  STRESS TEST: No   Physical Examination   Vitals:   05/11/17 1033  BP: (!) 133/95  Pulse: (!) 108  Resp: 20  Temp: 98.6 F (37 C)  TempSrc: Oral  SpO2: 93%  Weight: 149 lb 3.2 oz (67.7 kg)  Height: 5\' 5"  (1.651 m)    Body mass index is 24.83 kg/m.  General Alert, O x 3, WD, NAD  Head /AT,    Ear/Nose/Throat Hearing grossly intact, nares without erythema or drainage, oropharynx without Erythema or Exudate, Mallampati score: 3, Dentition intact  Eyes PERRLA, EOMI,    Neck Supple, mid-line trachea,    Pulmonary Sym exp, good B air movt, CTA B  Cardiac RRR, Nl S1, S2, no Murmurs, No rubs, No S3,S4    Vascular Vessel Right Left  Radial Palpable Palpable  Brachial Palpable Palpable  Carotid Palpable, No Bruit Palpable, No Bruit  Aorta Not palpable N/A  Femoral Palpable Palpable  Popliteal Not palpable Not palpable  PT Not palpable Palpable  DP Not palpable Palpable    Gastrointestinal soft, non-distended, non-tender to palpation, No guarding or rebound, no HSM, no masses, no CVAT B, No palpable prominent aortic pulse,    Musculoskeletal M/S 5/5 throughout  , Extremities without ischemic changes except markedly cyanotic R forefoot (most in R 1st, 2nd, and 5th toes), No edema present, Varicosities present: B moderate sized varicose veins, No Lipodermatosclerosis present  Neurologic Cranial nerves 2-12 intact  , Pain and light touch intact in extremities  , Motor exam as listed above  Psychiatric Judgement intact, Mood & affect appropriate for pt's clinical situation  Dermatologic See M/S exam for extremity exam, No rashes otherwise noted  Lymphatic  Palpable lymph nodes: None    Non-Invasive Vascular Imaging   ABI (05/10/17)  R:   ABI: 0.32,   PT: mono  DP: mono  TBI:  Not detectable  L:   ABI: 1.10,   PT: tri  DP: tri  TBI: 0.74   Outside Studies/Documentation   10 pages of outside documents were reviewed including: outpatient chart.   Medical Decision Making   ELEXIUS MINAR is a 66 y.o. female who presents with: RLE critical limb ischemia.   Unclear to me exact chronology of this patient's disease process, but even if embolic, her sx have been >2 months so I doubt she is a candidate for thrombolysis.  I discussed with the patient the natural history of critical limb ischemia: 25% require amputation in one year, 50% are able to maintain their limbs in one year, and 25-30% die in one year due to comorbidities.  Given the limb threatening status of this patient, I recommend an aggressive work up including proceeding with an: Aortogram, Bilateral runoff and  possible intervention right leg. I discussed with the patient  the nature of angiographic procedures, especially the limited patencies of any endovascular intervention. The patient is aware of that the risks of an angiographic procedure include but are not limited to: bleeding, infection, access site complications, embolization, rupture of treated vessel, dissection, possible need for emergent surgical intervention, and possible need for surgical procedures to treat the patient's pathology. The patient is aware of the risks and agrees to proceed.  The procedure is scheduled for: 21 MAY 18.  I discussed in depth with the patient the nature of atherosclerosis, and emphasized the importance of maximal medical management including strict control of blood pressure, blood glucose, and lipid levels, antiplatelet agents, obtaining regular exercise, and cessation of smoking.  The patient is aware that without maximal medical management the underlying atherosclerotic disease process will progress, limiting the benefit of any interventions. The patient is currently not on a statin due to reported allergy.  The patient is currently on an anti-platelet: ASA.  Thank you for allowing [place] to participate in this patient's care.   Adele Barthel, MD, FACS [facility] [place] Office: [phone] Pager: [phone]  05/11/2017, 11:00 AM

## 2017-05-14 NOTE — Progress Notes (Signed)
Patient arrived from the cath lab to 2W room 34.  Telemetry monitor was applied and CCMD notified.  Patient was oriented to unit and room to include call light and phone.  Will continue to monitor.

## 2017-05-14 NOTE — Progress Notes (Signed)
VASCULAR LAB PRELIMINARY  PRELIMINARY  PRELIMINARY  PRELIMINARY  Lower Extremity Vein Map    Right Great Saphenous Vein   Segment Diameter Comment  1. Origin 4.51mm   2. High Thigh 3.66mm   3. Mid Thigh 5.20mm branch  4. Low Thigh 3.66mm branch  5. At Knee 3.65mm   6. High Calf 2.90mm   7. Low Calf 3.73mm   8. Ankle 3.2mm    mm    mm    mm     Left Great Saphenous Vein   Segment Diameter Comment  1. Origin 4.66mm   2. High Thigh 4.49mm   3. Mid Thigh 4.57mm branch  4. Low Thigh 4.22mm   5. At Knee 3.64mm   6. High Calf 2.73mm   7. Low Calf 2.33mm   8. Ankle 2.32mm    mm    mm    mm       Landry Mellow, RVT 05/14/2017, 6:51 PM

## 2017-05-15 ENCOUNTER — Encounter (HOSPITAL_COMMUNITY): Payer: Self-pay | Admitting: Vascular Surgery

## 2017-05-15 ENCOUNTER — Encounter (HOSPITAL_COMMUNITY)
Admission: RE | Disposition: A | Discharge status: Home / Self Care | Payer: Self-pay | Source: Ambulatory Visit | Attending: Vascular Surgery

## 2017-05-15 ENCOUNTER — Inpatient Hospital Stay (HOSPITAL_COMMUNITY): Payer: Medicare Other | Admitting: Anesthesiology

## 2017-05-15 HISTORY — PX: BYPASS GRAFT POPLITEAL TO POPLITEAL: SHX5763

## 2017-05-15 LAB — CBC
HCT: 40.1 % (ref 36.0–46.0)
Hemoglobin: 13.6 g/dL (ref 12.0–15.0)
MCH: 33.3 pg (ref 26.0–34.0)
MCHC: 33.9 g/dL (ref 30.0–36.0)
MCV: 98 fL (ref 78.0–100.0)
Platelets: 308 10*3/uL (ref 150–400)
RBC: 4.09 MIL/uL (ref 3.87–5.11)
RDW: 13.2 % (ref 11.5–15.5)
WBC: 7.5 10*3/uL (ref 4.0–10.5)

## 2017-05-15 LAB — BASIC METABOLIC PANEL
Anion gap: 7 (ref 5–15)
BUN: 7 mg/dL (ref 6–20)
CO2: 29 mmol/L (ref 22–32)
Calcium: 8.9 mg/dL (ref 8.9–10.3)
Chloride: 103 mmol/L (ref 101–111)
Creatinine, Ser: 0.7 mg/dL (ref 0.44–1.00)
GFR calc Af Amer: >60 mL/min (ref >60)
GFR calc non Af Amer: >60 mL/min (ref >60)
Glucose, Bld: 93 mg/dL (ref 65–99)
Potassium: 3.3 mmol/L — ABNORMAL LOW (ref 3.5–5.1)
Sodium: 139 mmol/L (ref 135–145)

## 2017-05-15 LAB — PROTIME-INR
INR: 1.02
Prothrombin Time: 13.4 seconds (ref 11.4–15.2)

## 2017-05-15 LAB — LIPID PANEL
Cholesterol: 251 mg/dL — ABNORMAL HIGH (ref 0–200)
HDL: 47 mg/dL (ref >40)
LDL Cholesterol: 170 mg/dL — ABNORMAL HIGH (ref 0–99)
Total CHOL/HDL Ratio: 5.3 RATIO
Triglycerides: 168 mg/dL — ABNORMAL HIGH (ref <150)
VLDL: 34 mg/dL (ref 0–40)

## 2017-05-15 SURGERY — CREATION, BYPASS, ARTERIAL, POPLITEAL
Anesthesia: General | Site: Leg Lower | Laterality: Right

## 2017-05-15 MED ORDER — LIDOCAINE HCL (CARDIAC) 20 MG/ML IV SOLN
INTRAVENOUS | Status: DC | PRN
  Administered 2017-05-15: 60 mg via INTRAVENOUS

## 2017-05-15 MED ORDER — FENTANYL CITRATE (PF) 100 MCG/2ML IJ SOLN
INTRAMUSCULAR | Status: DC | PRN
  Administered 2017-05-15 (×5): 50 ug via INTRAVENOUS

## 2017-05-15 MED ORDER — ALBUTEROL SULFATE HFA 108 (90 BASE) MCG/ACT IN AERS
INHALATION_SPRAY | RESPIRATORY_TRACT | Status: AC
  Filled 2017-05-15: qty 6.7

## 2017-05-15 MED ORDER — PROTAMINE SULFATE 10 MG/ML IV SOLN
INTRAVENOUS | Status: DC | PRN
  Administered 2017-05-15: 60 mg via INTRAVENOUS

## 2017-05-15 MED ORDER — VANCOMYCIN HCL IN DEXTROSE 1-5 GM/200ML-% IV SOLN
1000.0000 mg | Freq: Two times a day (BID) | INTRAVENOUS | Status: AC
  Administered 2017-05-15 – 2017-05-16 (×2): 1000 mg via INTRAVENOUS
  Filled 2017-05-15 (×2): qty 200

## 2017-05-15 MED ORDER — DIPHENHYDRAMINE HCL 50 MG/ML IJ SOLN
INTRAMUSCULAR | Status: AC
  Filled 2017-05-15: qty 1

## 2017-05-15 MED ORDER — 0.9 % SODIUM CHLORIDE (POUR BTL) OPTIME
TOPICAL | Status: DC | PRN
  Administered 2017-05-15: 2000 mL

## 2017-05-15 MED ORDER — ONDANSETRON HCL 4 MG/2ML IJ SOLN
INTRAMUSCULAR | Status: DC | PRN
  Administered 2017-05-15: 4 mg via INTRAVENOUS

## 2017-05-15 MED ORDER — HYDROMORPHONE HCL 1 MG/ML IJ SOLN
INTRAMUSCULAR | Status: AC
  Administered 2017-05-15: 1 mg via INTRAVENOUS
  Filled 2017-05-15: qty 1

## 2017-05-15 MED ORDER — DEXAMETHASONE SODIUM PHOSPHATE 10 MG/ML IJ SOLN
INTRAMUSCULAR | Status: AC
  Filled 2017-05-15: qty 1

## 2017-05-15 MED ORDER — ACETAMINOPHEN 650 MG RE SUPP: 325.0000 mg | RECTAL | Status: DC | PRN

## 2017-05-15 MED ORDER — SODIUM CHLORIDE 0.9 % IV SOLN
INTRAVENOUS | Status: DC | PRN
  Administered 2017-05-15: 500 mL

## 2017-05-15 MED ORDER — SUGAMMADEX SODIUM 200 MG/2ML IV SOLN
INTRAVENOUS | Status: DC | PRN
  Administered 2017-05-15: 150 mg via INTRAVENOUS

## 2017-05-15 MED ORDER — GUAIFENESIN-DM 100-10 MG/5ML PO SYRP: 15.0000 mL | ORAL_SOLUTION | ORAL | Status: DC | PRN

## 2017-05-15 MED ORDER — GLYCOPYRROLATE 0.2 MG/ML IJ SOLN
INTRAMUSCULAR | Status: DC | PRN
  Administered 2017-05-15: 0.2 mg via INTRAVENOUS

## 2017-05-15 MED ORDER — PANTOPRAZOLE SODIUM 40 MG PO TBEC
40.0000 mg | DELAYED_RELEASE_TABLET | Freq: Every day | ORAL | Status: DC
Start: 2017-05-15 — End: 2017-05-18
  Administered 2017-05-15 – 2017-05-17 (×3): 40 mg via ORAL
  Filled 2017-05-15 (×4): qty 1

## 2017-05-15 MED ORDER — EPHEDRINE 5 MG/ML INJ
INTRAVENOUS | Status: AC
Start: 2017-05-15 — End: 2017-05-15
  Filled 2017-05-15: qty 10

## 2017-05-15 MED ORDER — PHENOL 1.4 % MT LIQD: 1.0000 | OROMUCOSAL | Status: DC | PRN

## 2017-05-15 MED ORDER — LABETALOL HCL 5 MG/ML IV SOLN: 10.0000 mg | INTRAVENOUS | Status: DC | PRN

## 2017-05-15 MED ORDER — POTASSIUM CHLORIDE CRYS ER 20 MEQ PO TBCR
20.0000 meq | EXTENDED_RELEASE_TABLET | Freq: Every day | ORAL | Status: AC | PRN
  Administered 2017-05-15: 40 meq via ORAL
  Filled 2017-05-15: qty 2

## 2017-05-15 MED ORDER — IOPAMIDOL (ISOVUE-300) INJECTION 61%
INTRAVENOUS | Status: AC
  Filled 2017-05-15: qty 50

## 2017-05-15 MED ORDER — HEPARIN SODIUM (PORCINE) 1000 UNIT/ML IJ SOLN
INTRAMUSCULAR | Status: AC
  Filled 2017-05-15: qty 2

## 2017-05-15 MED ORDER — HEPARIN SODIUM (PORCINE) 1000 UNIT/ML IJ SOLN
INTRAMUSCULAR | Status: DC | PRN
  Administered 2017-05-15: 7000 [IU] via INTRAVENOUS
  Administered 2017-05-15 (×2): 2000 [IU] via INTRAVENOUS

## 2017-05-15 MED ORDER — BISACODYL 5 MG PO TBEC: 5.0000 mg | DELAYED_RELEASE_TABLET | Freq: Every day | ORAL | Status: DC | PRN

## 2017-05-15 MED ORDER — PROTAMINE SULFATE 10 MG/ML IV SOLN
INTRAVENOUS | Status: AC
  Filled 2017-05-15: qty 5

## 2017-05-15 MED ORDER — SODIUM CHLORIDE 0.9 % IV SOLN
INTRAVENOUS | Status: DC
  Administered 2017-05-15: 20:00:00 via INTRAVENOUS

## 2017-05-15 MED ORDER — SODIUM CHLORIDE 0.9 % IV SOLN
500.0000 mL | Freq: Once | INTRAVENOUS | Status: AC | PRN
  Administered 2017-05-15: 500 mL via INTRAVENOUS

## 2017-05-15 MED ORDER — HYDROMORPHONE HCL 1 MG/ML IJ SOLN
INTRAMUSCULAR | Status: AC
  Filled 2017-05-15: qty 0.5

## 2017-05-15 MED ORDER — PHENYLEPHRINE HCL 10 MG/ML IJ SOLN
INTRAMUSCULAR | Status: DC | PRN
  Administered 2017-05-15: 80 ug via INTRAVENOUS

## 2017-05-15 MED ORDER — MIDAZOLAM HCL 2 MG/2ML IJ SOLN
INTRAMUSCULAR | Status: DC | PRN
  Administered 2017-05-15: 2 mg via INTRAVENOUS

## 2017-05-15 MED ORDER — ROCURONIUM BROMIDE 100 MG/10ML IV SOLN
INTRAVENOUS | Status: DC | PRN
  Administered 2017-05-15: 40 mg via INTRAVENOUS
  Administered 2017-05-15: 20 mg via INTRAVENOUS

## 2017-05-15 MED ORDER — DEXAMETHASONE SODIUM PHOSPHATE 10 MG/ML IJ SOLN
INTRAMUSCULAR | Status: DC | PRN
  Administered 2017-05-15: 10 mg via INTRAVENOUS

## 2017-05-15 MED ORDER — METOPROLOL TARTRATE 5 MG/5ML IV SOLN: 2.0000 mg | INTRAVENOUS | Status: DC | PRN

## 2017-05-15 MED ORDER — FENTANYL CITRATE (PF) 250 MCG/5ML IJ SOLN
INTRAMUSCULAR | Status: AC
  Filled 2017-05-15: qty 5

## 2017-05-15 MED ORDER — PROPOFOL 10 MG/ML IV BOLUS
INTRAVENOUS | Status: AC
  Filled 2017-05-15: qty 20

## 2017-05-15 MED ORDER — VANCOMYCIN HCL IN DEXTROSE 1-5 GM/200ML-% IV SOLN
1000.0000 mg | INTRAVENOUS | Status: AC
  Administered 2017-05-15: 1000 mg via INTRAVENOUS
  Filled 2017-05-15 (×2): qty 200

## 2017-05-15 MED ORDER — ALBUTEROL SULFATE HFA 108 (90 BASE) MCG/ACT IN AERS
INHALATION_SPRAY | RESPIRATORY_TRACT | Status: DC | PRN
  Administered 2017-05-15: 4 via RESPIRATORY_TRACT

## 2017-05-15 MED ORDER — LACTATED RINGERS IV SOLN
INTRAVENOUS | Status: DC
  Administered 2017-05-15 (×5): via INTRAVENOUS

## 2017-05-15 MED ORDER — PAPAVERINE HCL 30 MG/ML IJ SOLN
INTRAMUSCULAR | Status: AC
  Filled 2017-05-15: qty 2

## 2017-05-15 MED ORDER — ENOXAPARIN SODIUM 40 MG/0.4ML ~~LOC~~ SOLN
40.0000 mg | SUBCUTANEOUS | Status: DC
  Administered 2017-05-16 – 2017-05-18 (×3): 40 mg via SUBCUTANEOUS
  Filled 2017-05-15 (×4): qty 0.4

## 2017-05-15 MED ORDER — HYDROMORPHONE HCL 1 MG/ML IJ SOLN
0.5000 mg | INTRAMUSCULAR | Status: DC | PRN
  Administered 2017-05-15 – 2017-05-16 (×2): 1 mg via INTRAVENOUS
  Filled 2017-05-15: qty 1

## 2017-05-15 MED ORDER — HYDRALAZINE HCL 20 MG/ML IJ SOLN: 5.0000 mg | INTRAMUSCULAR | Status: DC | PRN

## 2017-05-15 MED ORDER — DOCUSATE SODIUM 100 MG PO CAPS
100.0000 mg | ORAL_CAPSULE | Freq: Every day | ORAL | Status: DC
  Administered 2017-05-16 – 2017-05-17 (×2): 100 mg via ORAL
  Filled 2017-05-15 (×3): qty 1

## 2017-05-15 MED ORDER — PHENYLEPHRINE 40 MCG/ML (10ML) SYRINGE FOR IV PUSH (FOR BLOOD PRESSURE SUPPORT)
PREFILLED_SYRINGE | INTRAVENOUS | Status: AC
  Filled 2017-05-15: qty 10

## 2017-05-15 MED ORDER — SENNOSIDES-DOCUSATE SODIUM 8.6-50 MG PO TABS: 1.0000 | ORAL_TABLET | Freq: Every evening | ORAL | Status: DC | PRN

## 2017-05-15 MED ORDER — MAGNESIUM SULFATE 2 GM/50ML IV SOLN
2.0000 g | Freq: Every day | INTRAVENOUS | Status: DC | PRN
  Filled 2017-05-15: qty 50

## 2017-05-15 MED ORDER — HEMOSTATIC AGENTS (NO CHARGE) OPTIME
TOPICAL | Status: DC | PRN
  Administered 2017-05-15: 1 via TOPICAL

## 2017-05-15 MED ORDER — ACETAMINOPHEN 325 MG PO TABS: 325.0000 mg | ORAL_TABLET | ORAL | Status: DC | PRN

## 2017-05-15 MED ORDER — SODIUM CHLORIDE 0.9 % IV SOLN
INTRAVENOUS | Status: DC | PRN
  Administered 2017-05-15: 50 ug/min via INTRAVENOUS

## 2017-05-15 MED ORDER — PROPOFOL 10 MG/ML IV BOLUS
INTRAVENOUS | Status: DC | PRN
  Administered 2017-05-15: 150 mg via INTRAVENOUS

## 2017-05-15 MED ORDER — DIPHENHYDRAMINE HCL 50 MG/ML IJ SOLN
INTRAMUSCULAR | Status: DC | PRN
  Administered 2017-05-15: 12.5 mg via INTRAVENOUS

## 2017-05-15 MED ORDER — ALUM & MAG HYDROXIDE-SIMETH 200-200-20 MG/5ML PO SUSP: 15.0000 mL | ORAL | Status: DC | PRN

## 2017-05-15 MED ORDER — PROTAMINE SULFATE 10 MG/ML IV SOLN
INTRAVENOUS | Status: AC
  Filled 2017-05-15: qty 10

## 2017-05-15 MED ORDER — LIDOCAINE 2% (20 MG/ML) 5 ML SYRINGE
INTRAMUSCULAR | Status: AC
  Filled 2017-05-15: qty 5

## 2017-05-15 MED ORDER — MIDAZOLAM HCL 2 MG/2ML IJ SOLN
INTRAMUSCULAR | Status: AC
  Filled 2017-05-15: qty 2

## 2017-05-15 MED ORDER — PROMETHAZINE HCL 25 MG/ML IJ SOLN: 6.2500 mg | INTRAMUSCULAR | Status: DC | PRN

## 2017-05-15 MED ORDER — HYDROMORPHONE HCL 1 MG/ML IJ SOLN
0.2500 mg | INTRAMUSCULAR | Status: DC | PRN
  Administered 2017-05-15 (×2): 0.5 mg via INTRAVENOUS

## 2017-05-15 SURGICAL SUPPLY — 64 items
BANDAGE ACE 4X5 VEL STRL LF (GAUZE/BANDAGES/DRESSINGS) IMPLANT
BANDAGE ESMARK 6X9 LF (GAUZE/BANDAGES/DRESSINGS) ×1 IMPLANT
BLADE SURG 11 STRL SS (BLADE) ×2 IMPLANT
BNDG ESMARK 6X9 LF (GAUZE/BANDAGES/DRESSINGS) ×2
CANISTER SUCT 3000ML PPV (MISCELLANEOUS) ×2 IMPLANT
CANNULA VESSEL 3MM 2 BLNT TIP (CANNULA) ×2 IMPLANT
CLIP TI MEDIUM 24 (CLIP) ×2 IMPLANT
CLIP TI WIDE RED SMALL 24 (CLIP) ×2 IMPLANT
COVER PROBE W GEL 5X96 (DRAPES) ×2 IMPLANT
CUFF TOURNIQUET SINGLE 24IN (TOURNIQUET CUFF) IMPLANT
CUFF TOURNIQUET SINGLE 34IN LL (TOURNIQUET CUFF) ×2 IMPLANT
CUFF TOURNIQUET SINGLE 44IN (TOURNIQUET CUFF) IMPLANT
DERMABOND ADVANCED (GAUZE/BANDAGES/DRESSINGS) ×1
DERMABOND ADVANCED .7 DNX12 (GAUZE/BANDAGES/DRESSINGS) ×1 IMPLANT
DRAIN CHANNEL 15F RND FF W/TCR (WOUND CARE) IMPLANT
DRAPE HALF SHEET 40X57 (DRAPES) IMPLANT
DRAPE X-RAY CASS 24X20 (DRAPES) IMPLANT
ELECT REM PT RETURN 9FT ADLT (ELECTROSURGICAL) ×2
ELECTRODE REM PT RTRN 9FT ADLT (ELECTROSURGICAL) ×1 IMPLANT
EVACUATOR SILICONE 100CC (DRAIN) IMPLANT
GAUZE SPONGE 4X4 16PLY XRAY LF (GAUZE/BANDAGES/DRESSINGS) ×2 IMPLANT
GLOVE BIO SURGEON STRL SZ7 (GLOVE) ×4 IMPLANT
GLOVE BIOGEL PI IND STRL 6 (GLOVE) ×1 IMPLANT
GLOVE BIOGEL PI IND STRL 6.5 (GLOVE) ×3 IMPLANT
GLOVE BIOGEL PI IND STRL 7.5 (GLOVE) ×2 IMPLANT
GLOVE BIOGEL PI INDICATOR 6 (GLOVE) ×1
GLOVE BIOGEL PI INDICATOR 6.5 (GLOVE) ×3
GLOVE BIOGEL PI INDICATOR 7.5 (GLOVE) ×2
GLOVE ECLIPSE 6.5 STRL STRAW (GLOVE) ×2 IMPLANT
GLOVE SURG SS PI 6.5 STRL IVOR (GLOVE) ×2 IMPLANT
GOWN STRL REUS W/ TWL LRG LVL3 (GOWN DISPOSABLE) ×4 IMPLANT
GOWN STRL REUS W/TWL LRG LVL3 (GOWN DISPOSABLE) ×4
HEMOSTAT SPONGE AVITENE ULTRA (HEMOSTASIS) ×2 IMPLANT
INSERT FOGARTY SM (MISCELLANEOUS) IMPLANT
KIT BASIN OR (CUSTOM PROCEDURE TRAY) ×2 IMPLANT
KIT ROOM TURNOVER OR (KITS) ×2 IMPLANT
MARKER GRAFT CORONARY BYPASS (MISCELLANEOUS) IMPLANT
MARKER SKIN DUAL TIP RULER LAB (MISCELLANEOUS) ×2 IMPLANT
NS IRRIG 1000ML POUR BTL (IV SOLUTION) ×4 IMPLANT
PACK PERIPHERAL VASCULAR (CUSTOM PROCEDURE TRAY) ×2 IMPLANT
PAD ARMBOARD 7.5X6 YLW CONV (MISCELLANEOUS) ×4 IMPLANT
SET COLLECT BLD 21X3/4 12 (NEEDLE) IMPLANT
STAPLER VISISTAT 35W (STAPLE) IMPLANT
STOPCOCK 4 WAY LG BORE MALE ST (IV SETS) ×2 IMPLANT
SUT ETHILON 3 0 PS 1 (SUTURE) IMPLANT
SUT GORETEX 5 0 TT13 24 (SUTURE) IMPLANT
SUT GORETEX 6.0 TT13 (SUTURE) IMPLANT
SUT MNCRL AB 4-0 PS2 18 (SUTURE) ×6 IMPLANT
SUT PROLENE 5 0 C 1 24 (SUTURE) ×2 IMPLANT
SUT PROLENE 6 0 BV (SUTURE) ×12 IMPLANT
SUT PROLENE 7 0 BV 1 (SUTURE) ×10 IMPLANT
SUT SILK 2 0 FS (SUTURE) IMPLANT
SUT SILK 2 0 SH (SUTURE) ×4 IMPLANT
SUT SILK 3 0 (SUTURE) ×1
SUT SILK 3-0 18XBRD TIE 12 (SUTURE) ×1 IMPLANT
SUT VIC AB 2-0 CT1 27 (SUTURE) ×2
SUT VIC AB 2-0 CT1 TAPERPNT 27 (SUTURE) ×2 IMPLANT
SUT VIC AB 3-0 SH 27 (SUTURE) ×3
SUT VIC AB 3-0 SH 27X BRD (SUTURE) ×3 IMPLANT
TRAY FOLEY W/METER SILVER 16FR (SET/KITS/TRAYS/PACK) ×2 IMPLANT
TUBE CONNECTING 12X1/4 (SUCTIONS) ×2 IMPLANT
TUBING EXTENTION W/L.L. (IV SETS) ×2 IMPLANT
UNDERPAD 30X30 (UNDERPADS AND DIAPERS) ×2 IMPLANT
WATER STERILE IRR 1000ML POUR (IV SOLUTION) ×2 IMPLANT

## 2017-05-15 NOTE — Anesthesia Preprocedure Evaluation (Addendum)
Anesthesia Evaluation  Patient identified by MRN, date of birth, ID band Patient awake    Reviewed: Allergy & Precautions, NPO status , Patient's Chart, lab work & pertinent test results  Airway Mallampati: II  TM Distance: >3 FB Neck ROM: Full    Dental  (+) Dental Advisory Given   Pulmonary asthma , sleep apnea , COPD,  COPD inhaler, former smoker,    breath sounds clear to auscultation       Cardiovascular hypertension, Pt. on medications + Peripheral Vascular Disease   Rhythm:Regular Rate:Normal     Neuro/Psych  Headaches, TIA   GI/Hepatic Neg liver ROS, GERD  ,  Endo/Other  negative endocrine ROS  Renal/GU negative Renal ROS     Musculoskeletal  (+) Arthritis ,   Abdominal   Peds  Hematology negative hematology ROS (+)   Anesthesia Other Findings   Reproductive/Obstetrics                            Lab Results  Component Value Date   WBC 7.5 05/15/2017   HGB 13.6 05/15/2017   HCT 40.1 05/15/2017   MCV 98.0 05/15/2017   PLT 308 05/15/2017   Lab Results  Component Value Date   CREATININE 0.70 05/15/2017   BUN 7 05/15/2017   NA 139 05/15/2017   K 3.3 (L) 05/15/2017   CL 103 05/15/2017   CO2 29 05/15/2017    Anesthesia Physical Anesthesia Plan  ASA: III  Anesthesia Plan: General   Post-op Pain Management:    Induction: Intravenous  Airway Management Planned: Oral ETT  Additional Equipment:   Intra-op Plan:   Post-operative Plan: Extubation in OR  Informed Consent: I have reviewed the patients History and Physical, chart, labs and discussed the procedure including the risks, benefits and alternatives for the proposed anesthesia with the patient or authorized representative who has indicated his/her understanding and acceptance.   Dental advisory given  Plan Discussed with: CRNA  Anesthesia Plan Comments:         Anesthesia Quick Evaluation

## 2017-05-15 NOTE — Anesthesia Postprocedure Evaluation (Signed)
Anesthesia Post Note  Patient: HITOMI SLAPE  Procedure(s) Performed: Procedure(s) (LRB): BYPASS GRAFT ABOVE KNEE POPLITEAL TO BELOW KNEE POPLITEAL ARTERY (Right)  Patient location during evaluation: PACU Anesthesia Type: General Level of consciousness: awake and alert Pain management: pain level controlled Vital Signs Assessment: post-procedure vital signs reviewed and stable Respiratory status: spontaneous breathing, nonlabored ventilation, respiratory function stable and patient connected to nasal cannula oxygen Cardiovascular status: blood pressure returned to baseline and stable Postop Assessment: no signs of nausea or vomiting Anesthetic complications: no       Last Vitals:  Vitals:   05/15/17 1630 05/15/17 1700  BP: 111/80 109/77  Pulse: (!) 106 100  Resp: 13 12  Temp:      Last Pain:  Vitals:   05/15/17 1600  TempSrc:   PainSc: Asleep        RLE Motor Response: Purposeful movement;Responds to commands (05/15/17 1700) RLE Sensation: Full sensation (05/15/17 1700)      Tiajuana Amass

## 2017-05-15 NOTE — Transfer of Care (Signed)
Immediate Anesthesia Transfer of Care Note  Patient: Brenda Carney  Procedure(s) Performed: Procedure(s): BYPASS GRAFT ABOVE KNEE POPLITEAL TO BELOW KNEE POPLITEAL ARTERY (Right)  Patient Location: PACU  Anesthesia Type:General  Level of Consciousness: awake, alert  and oriented  Airway & Oxygen Therapy: Patient Spontanous Breathing and Patient connected to nasal cannula oxygen  Post-op Assessment: Report given to RN and Post -op Vital signs reviewed and stable  Post vital signs: Reviewed and stable  Last Vitals:  Vitals:   05/14/17 2050 05/15/17 0438  BP: (!) 144/78 (!) 153/77  Pulse: 72 75  Resp: 19 19  Temp: 36.7 C 36.9 C    Last Pain:  Vitals:   05/15/17 0730  TempSrc:   PainSc: Asleep         Complications: No apparent anesthesia complications

## 2017-05-15 NOTE — Op Note (Signed)
OPERATIVE NOTE   PROCEDURE: 1.  Right above-the-knee popliteal artery to below-the-knee popliteal artery bypass with ipsilateral non-reversed greater saphenous vein  2.  Linton patch adjunct to below-the-knee popliteal artery  PRE-OPERATIVE DIAGNOSIS: right foot ischemia  POST-OPERATIVE DIAGNOSIS: same as above   SURGEON: Adele Barthel, MD  ASSISTANT(S): Gerri Lins, PAC   ANESTHESIA: general  ESTIMATED BLOOD LOSS: 200 cc  FINDING(S): 1.  Atherosclerotic plaque evident in above-the-knee popliteal artery 2.  Calcific anterior plaque in above-the-knee popliteal artery requiring endarterectomy and Linton patch placement 3.  Dopplerable anterior tibial artery and posterior tibial artery signals at end of case: signal abates with bypass compression  SPECIMEN(S):  none  INDICATIONS:   Brenda Carney is a 66 y.o. female who presents with right foot ischemia with imminent development of gangrene.  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  I recommended: right above-the-knee popliteal artery to below-the-knee popliteal artery bypass.  The risk, benefits, and alternative for bypass operations were discussed with the patient.  The patient is aware the risks include but are not limited to: bleeding, infection, myocardial infarction, stroke, limb loss, nerve damage, need for additional procedures in the future, wound complications, and inability to complete the bypass.  The patient is aware of these risks and agreed to proceed.   DESCRIPTION: After full informed written consent was obtained, the patient was brought back to the operating room and placed supine upon the operating table.  Prior to induction, the patient was given intravenous antibiotics.  After obtaining adequate anesthesia, the patient was prepped and draped in the standard fashion for a femoral to popliteal bypass operation.  Attention was turned to the distal thigh  groin.  A longitudinal incision was made over superficial femoral artery at Hunter's canal.  Using blunt dissection and electrocautery, I developed a plane down to the superficial femoral artery, which I dissected out proximally and distally.  The artery was compressible on examination with limited calcification.   At this point, attention was turned to the calf.  An longitudinal incision was made one finger-width posterior to the tibia.  Using blunt dissection and electrocautery, a plane was developed through the subcutaneous tissue and fascia down to the popliteal space.  The popliteal vein was dissected out and retracted medially and posteriorly.  The below-the-knee popliteal artery was dissected away from lateral popliteal vein.  This popliteal artery was found on exam to be diseased but minimally calcified.     At this point, the patient's right greater saphenous vein was identified under Sonosite guidance.  I dissected out the vein in the distal thigh through one incision and then carried this dissection via skin bridge to the level of the calf.  The vein traversed the calf anterior to the popliteal exposure incision.  The vein conduit was found to be adequate with size: 3-4 mm.  Side branches of greater saphenous vein were tied off with 4-0 silk or clipped with small titanium clips.  I clamped the vein proximally in the thigh and then tied off the distal vein in the calf and then transected the vein.  A vessel cannula was tied to the distal end of the vein and the vein was tested by hydrodistension.  Leaks in the conduit were repaired with 4-0 silk ties and 7-0 Prolene stitches.  At the end of this process, I felt the conduit to be:  adequate quality.  I clamped the vein in the distal thigh and then  transected the vein conduit.  The conduit was carefully soaked in heparinized saline.  I tied off the vein with a 2-0 silk tie.  The patient was given 7000 units of Heparin intravenously, which was a  therapeutic bolus. An additional 2000 units of Heparin was administered every hour after initial bolus to maintain anticoagulation.  In total, 13000 units of Heparin was administrated to achieve and maintain a therapeutic level of anticoagulation.  I placed a sterile tourniquet on the proximal thigh.  After waiting three minutes, I exsanguinated the right leg with an Esmark bandage and then inflated the tourniquet at 250 mm Hg.    I turned my attention to the above-the-knee exposure.  I made an arteriotomy with a 11-blade and extended it proximally and distally with a Potts scissor.  Immediately there was some obvious plaque present posteriorly that was densely adherent.  I spatulated the proximal end of the vein conduit to meet the dimensions of the arteriotomy.  I sewed the vein to the above-the-knee popliteal artery in an end-to-side configuration.  I released the tourniquet upon completion.  I then passed valvulotome into the vein conduit and bluntly lysed some valves.  I had to repair some side branches that were not evident previously with 7-0 prolene stitches.  I also had to repair a few area injured by the valvulotome.  At this point, there was no obvious injury to this vein conduit.  There was pulsatile bleeding from this vein.  I clamped the distal end of this vein and then marked the anterior orientation.    At this point, I bluntly dissected a space between the femoral condyles adjacent to the below-the-knee popliteal vessels.  I bluntly passed a curved Gore metal tunneler between the femoral condyles in a subsartorial fashion to the above-the-knee incision.  The bullet on the tunneler was removed and then the vein  conduit was sewn to the inner cannula with a 2-0 Silk.  I passed the conduit through the metal tunnel, taking care to maintain the orientation of the conduit.    At this point, there appeared to be some rotation in the anastomosis and no pulse in the vein distally, so I elected to pull  the vein conduit out of its tunnel.  I bluntly passed a straight Gore metal tunneler between the femoral condyles in the same fashion to the above-the-knee incision.  Again the vein was sewn to the inner cannula.  I delivered the vein in the metal tunnel.  I released the clamp on the vein in the tunnel.  This maneuver appeared to removed any rotation issues in the vein conduit.  I clamped the vein distally.  I exsanguinated the right leg again and reinflated the tourniquet.  Attention was then turned to the popliteal exposure.   I reset the exposure of the popliteal space.  I verified the popliteal artery was appropriately marked.  I determine the target segment for the anastomosis.  I tried to make an incision with a 11-blade in the artery but it did not appear to penetrate the popliteal artery.  I tried a 15 blade also same outcome.  I got a new 11-blade and made an arteriotomy.  This finally was able to penetrate a calcific segment.  I extended the arteriotomy proximally and distally.  In the process, an anteriorly oriented calcific plaque fragmented.  This could not be easily resolved with a limited endarterectomy, so I had to extend the arteriotomy distally.  I did an endarterectomy on this  plaque until it feathered out in this artery.  There did not appear to a dissection flap and the residual disease was adherent to the wall.  Due to the length of the arteriotomy, I felt placement of a Linton patch was indicated.  I transected a distal segment of the vein conduit for use as a vein patch.  I sharply opened up the vein longitudinally with a Potts scissor.  I sewed the vein to the below-the-knee popliteal artery with a running stitch of 7-0 Prolene.  I then made a venotomy and extended it proximally and distally.  I straightened the lower leg and then determine the length of vein needed.  I transected the vein bypass to appropriate length.  I spatulated the vein to accommodate the dimension of the venotomy.   I sewed the vein conduit to the vein patch on the below-the-knee popliteal artery with a running stitch of 6-0 Prolene in an end-to-side configuration.  I released the tourniquet.    Immediately there was not a strong pulse in the vein conduit or the native superficial femoral artery.  I packed all incision with Avitene.  After waiting a few minutes, the prior pulse in the superficial femoral artery, vein bypass, and below-the-knee popliteal artery resumed, suggesting some degree of spasm.  Distally there were signals in the anterior tibial artery and posterior tibial artery that abated with compression of the vein graft.  At this point, I turned my attention to the above-the-knee exposure.  I repaired a few areas of the suture line with 6-0 Prolene stitches.  I repacked this exposure with Avitene.  I then turned my attention to the calf exposure.  I repaired a few segments of the Linton patch with 7-0 Prolene stitches.  I repacked this exposure with Avitene.  After waiting a few more minutes, subcutaneous bleeding in both incisions were controlled with electrocautery.  After another round of Avitene, no further active bleeding was present.  I washed out both incisions.  The calf was repaired with a fascial layer of 2-0 Vicryl, a running stitch of 3-0 Vicryl in the subcutaneous tissue, and the skin was reapproximated with a running subcuticular of 4-0 Monocryl.  After cleaning and drying the skin, the skin was reinforced with Dermabond.  Attention was turned to the above-the-knee popliteal exposure.  The fascia was repaired with a layer of 2-0 Vicryl.  Then the superficial subcutaneous tissue was reapproximated with a layer of 3-0 Vicryl.  The skin was reapproximated with a running subcuticular of 4-0 Monocryl.  The skin was cleaned, dried, and reinforced with Dermabond.  The vein harvest incision was closed with a layer of 3-0 Vicryl in the subcutaneous tissue.  The skin was then reapproximated with a  running subcuticular stitch of 4-0 Monocryl.  The skin was cleaned, dried, and sterile bandages applied.    COMPLICATIONS: none  CONDITION: stable   Adele Barthel, MD, [facility] [place] Office: [phone] Pager: [phone]  05/15/2017, 2:55 PM

## 2017-05-15 NOTE — Interval H&P Note (Signed)
   History and Physical Update  The patient was interviewed and re-examined.  The patient's previous History and Physical has been reviewed and is unchanged from my consult except for: interval R leg angiogram.  Pt also underwent preop Cardiology evaluation: clear for surgery.  Pt also got vein mapping: adequate R GSV.  Plan is R above-the-knee popliteal artery to below-the-knee popliteal artery bypass with ipsilateral greater saphenous vein.   The risk, benefits, and alternative for bypass operations were discussed with the patient.    The patient is aware the risks include but are not limited to: bleeding, infection, myocardial infarction, stroke, limb loss, nerve damage, leg swelling, vein harvest complications, need for additional procedures in the future, wound complications, and inability to complete the bypass.   The patient is aware of these risks and agreed to proceed.  Adele Barthel, MD, FACS [facility] [place] Office: [phone] Pager: [phone]  05/15/2017, 10:12 AM

## 2017-05-15 NOTE — Progress Notes (Signed)
Retrieved patient's belongings from 2W34.  Will transport with her from PACU once stepdown bed is available.

## 2017-05-15 NOTE — Anesthesia Procedure Notes (Signed)
Procedure Name: Intubation Date/Time: 05/15/2017 10:30 AM Performed by: Rejeana Brock L Pre-anesthesia Checklist: Patient identified, Emergency Drugs available, Suction available, Patient being monitored and Timeout performed Patient Re-evaluated:Patient Re-evaluated prior to inductionOxygen Delivery Method: Circle system utilized Preoxygenation: Pre-oxygenation with 100% oxygen Intubation Type: IV induction Ventilation: Mask ventilation without difficulty Laryngoscope Size: Miller and 2 Grade View: Grade I Tube type: Oral Tube size: 7.0 mm Number of attempts: 1 Airway Equipment and Method: Stylet and Patient positioned with wedge pillow Placement Confirmation: ETT inserted through vocal cords under direct vision,  positive ETCO2 and CO2 detector Secured at: 22 cm Tube secured with: Tape Dental Injury: Teeth and Oropharynx as per pre-operative assessment  Comments: Intubation performed by Eilene Ghazi, SRNA with supervision by Rejeana Brock, CRNA and Ola Spurr, MD at bedside

## 2017-05-16 ENCOUNTER — Encounter (HOSPITAL_COMMUNITY): Payer: Self-pay | Admitting: Vascular Surgery

## 2017-05-16 NOTE — Care Management Note (Addendum)
Case Management Note  Patient Details   Name: Brenda Carney MRN: [id number] Date of Birth: 05-17-1951  Subjective/Objective:    From home alone, she has two friendly dogs. s/p Right above-the-knee popliteal artery to below-the-knee popliteal artery bypass.  per pt eval rec HHPT, and rolling walker , 3 n 1.  She has medicare [employer].  Patient chose [facility] from [place] list. Referral made to [facility].  Soc will begin 24 to 48 hrs post dc.  Patient does not want the 3 n 1.    PCP listed is Rupashree Varadarajan               Action/Plan: NCM will follow for dc needs.   Expected Discharge Date:                  Expected Discharge Plan:  [place]  In-House Referral:     Discharge planning Services  CM Consult  Post Acute Care Choice:  Durable Medical Equipment, Home Health Choice offered to:  Patient  DME Arranged:  3-N-1, Walker rolling DME Agency:  [place]:  PT [place]:  [place]  Status of Service:  In process, will continue to follow  If discussed at Long Length of Stay Meetings, dates discussed:    Additional Comments:  Zenon Mayo, RN 05/16/2017, 2:35 PM

## 2017-05-16 NOTE — Evaluation (Signed)
Occupational Therapy Evaluation Patient Details Name: Brenda Carney MRN: [id number] DOB: [birth year] Today's Date: 05/16/2017    History of Present Illness Patient is a 66 y/o female admitted with R foot ischemia not s/p Right above-the-knee popliteal artery to below-the-knee popliteal artery bypass.  PMH positive for HTN, active smoking, cerebral aneurysm, CVA, headache,  hyperlipidemia, sleep apnea- doe snot wear CPAP, TIA.   Clinical Impression   Patient evaluated by Occupational Therapy with no further acute OT needs identified. All education has been completed and the patient has no further questions. See below for any follow-up Occupational Therapy or equipment needs. OT to sign off. Thank you for referral.      Follow Up Recommendations  No OT follow up    Equipment Recommendations  3 in 1 bedside commode    Recommendations for Other Services       Precautions / Restrictions Precautions Precautions: Fall      Mobility Bed Mobility               General bed mobility comments: in chair on arrival  Transfers Overall transfer level: Needs assistance Equipment used: Rolling walker (2 wheeled) Transfers: Sit to/from Stand Sit to Stand: Modified independent (Device/Increase time)         General transfer comment: pushes up from armrests on chair without cues, assist for safety with lines, etc    Balance Overall balance assessment: Needs assistance Sitting-balance support: No upper extremity supported Sitting balance-Leahy Scale: Good     Standing balance support: Bilateral upper extremity supported Standing balance-Leahy Scale: Fair Standing balance comment: can stand usupported, but walker for ambulation                           ADL either performed or assessed with clinical judgement   ADL Overall ADL's : Independent                                       General ADL Comments: pt able to reach ankles and demonstrates  shower transfer  Pt educated on bathing and avoid washing directly on incision. Pt educated to use new wash cloth and towel each day. Pt educated to allow water to run across dressing and not to soak in a tub at this time. Pt advised RN will instruct on any bandages required otherwise is open to air.   Pt educated on use of [employer] and [employer] grocery delivery / pick up services. Pt location in town could allow her to use these services due to living alone;Marland Kitchen     Vision         Perception     Praxis      Pertinent Vitals/Pain Pain Assessment: No/denies pain Pain Score: 2  Pain Location: R foot/leg Pain Descriptors / Indicators: Sore Pain Intervention(s): Premedicated before session;Monitored during session     Hand Dominance Right   Extremity/Trunk Assessment Upper Extremity Assessment Upper Extremity Assessment: Overall WFL for tasks assessed   Lower Extremity Assessment Lower Extremity Assessment: Defer to PT evaluation RLE Deficits / Details: moves well on recliner, but cannot reach her foot to don sock independently   Cervical / Trunk Assessment Cervical / Trunk Assessment: Normal   Communication Communication Communication: No difficulties   Cognition Arousal/Alertness: Awake/alert Behavior During Therapy: WFL for tasks assessed/performed Overall Cognitive Status: Within Functional Limits for tasks assessed  General Comments  educated on avoiding directly touching wound to decr risk of infections    Exercises     Shoulder Instructions      Home Living Family/patient expects to be discharged to:: Private residence Living Arrangements: Alone Available Help at Discharge: Other (Comment) Type of Home: House Home Access: Stairs to enter [employer] of Steps: 2 at from no rail, 6 at back L rail   Home Layout: Two level Alternate Level Stairs-Number of Steps: flight Alternate Level  Stairs-Rails: Right Bathroom Shower/Tub: Teacher, early years/pre: Handicapped height     Home Equipment: [place] - single point   Additional Comments: pt has 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 pt has help to (A) with horses. pt has 2 dogs within the home. pt plans to have assistant help with other care if they are agreeable. [place]      Prior Functioning/Environment Level of Independence: Independent        Comments: reports limited due to foot pain, but no devices        OT Problem List:        OT Treatment/Interventions:      OT Goals(Current goals can be found in the care plan section) Acute Rehab OT Goals Patient Stated Goal: To return to independent OT Goal Formulation: With patient  OT Frequency:     Barriers to D/C:            Co-evaluation              AM-PAC PT "6 Clicks" Daily Activity     Outcome Measure Help from another person eating meals?: None Help from another person taking care of personal grooming?: None Help from another person toileting, which includes using toliet, bedpan, or urinal?: None Help from another person bathing (including washing, rinsing, drying)?: None Help from another person to put on and taking off regular upper body clothing?: None Help from another person to put on and taking off regular lower body clothing?: None 6 Click Score: 24   End of Session Equipment Utilized During Treatment: Gait belt;Rolling walker Nurse Communication: Mobility status  Activity Tolerance: Patient tolerated treatment well Patient left: in bed;with bed alarm set  OT Visit Diagnosis: Unsteadiness on feet (R26.81)                Time: 3546-5681 OT Time Calculation (min): 19 min Charges:  OT General Charges $OT Visit: 1 Procedure OT Evaluation $OT Eval Moderate Complexity: 1 Procedure G-Codes:      Jeri Modena   OTR/L Pager: [phone] Office: [phone] .   Parke Poisson  B 05/16/2017, 2:13 PM

## 2017-05-16 NOTE — Progress Notes (Signed)
Report called to [facility] RN 2W 08.

## 2017-05-16 NOTE — Progress Notes (Signed)
Page  [phone] to let Dr Bridgett Larsson know patient complaining of more numbnes in the leg.

## 2017-05-16 NOTE — Evaluation (Signed)
Physical Therapy Evaluation Patient Details Name: Brenda Carney MRN: [id number] DOB: 17-Jul-1951 Today's Date: 05/16/2017   History of Present Illness  Patient is a 66 y/o female admitted with R foot ischemia not s/p Right above-the-knee popliteal artery to below-the-knee popliteal artery bypass.  PMH positive for HTN, active smoking, cerebral aneurysm, CVA, headache,  hyperlipidemia, sleep apnea- doe snot wear CPAP, TIA.  Clinical Impression  Patient presents with decreased mobility due to pain in R LE and limited experience with walker/safety.  She will benefit from skilled PT in the acute setting to allow return home with little to no help in two story home.  She has help for feeding her animals and can possibly get help for food/meals, but plans to be as independent as possible.  Currently minguard to supervision overall for mobility.    Follow Up Recommendations Home health PT (? [facility])    Equipment Recommendations  Rolling walker with 5" wheels    Recommendations for Other Services       Precautions / Restrictions Precautions Precautions: Fall      Mobility  Bed Mobility               General bed mobility comments: up in chair  Transfers Overall transfer level: Needs assistance Equipment used: Rolling walker (2 wheeled) Transfers: Sit to/from Stand Sit to Stand: Supervision         General transfer comment: pushes up from armrests on chair without cues, assist for safety with lines, etc  Ambulation/Gait Ambulation/Gait assistance: Supervision;Min guard Ambulation Distance (Feet): 250 Feet Assistive device: Rolling walker (2 wheeled) Gait Pattern/deviations: Step-through pattern;Antalgic;Decreased stance time - right     General Gait Details: cues for safety on turns  Stairs            Wheelchair Mobility    Modified Rankin (Stroke Patients Only)       Balance Overall balance assessment: Needs assistance Sitting-balance support: No upper  extremity supported Sitting balance-Leahy Scale: Good     Standing balance support: Bilateral upper extremity supported Standing balance-Leahy Scale: Fair Standing balance comment: can stand usupported, but walker for ambulation                             Pertinent Vitals/Pain Pain Assessment: 0-10 Pain Score: 2  Pain Location: R foot/leg Pain Descriptors / Indicators: Sore Pain Intervention(s): Premedicated before session;Monitored during session    Home Living Family/patient expects to be discharged to:: Private residence Living Arrangements: Alone Available Help at Discharge: Other (Comment) (someone who is helping feed her horses and dogs) Type of Home: House Home Access: Stairs to enter   [employer] of Steps: 2 at from no rail, 6 at back L rail Home Layout: Two level Home Equipment: Cane - single point      Prior Function Level of Independence: Independent         Comments: reports limited due to foot pain, but no devices     Hand Dominance   Dominant Hand: Right    Extremity/Trunk Assessment   Upper Extremity Assessment Upper Extremity Assessment: Defer to OT evaluation    Lower Extremity Assessment Lower Extremity Assessment: RLE deficits/detail RLE Deficits / Details: moves well on recliner, but cannot reach her foot to don sock independently       Communication   Communication: No difficulties  Cognition Arousal/Alertness: Awake/alert Behavior During Therapy: WFL for tasks assessed/performed Overall Cognitive Status: Within Functional Limits for tasks  assessed                                        General Comments      Exercises     Assessment/Plan    PT Assessment Patient needs continued PT services  PT Problem List Pain;Decreased knowledge of use of DME;Decreased balance;Decreased range of motion;Decreased activity tolerance;Decreased safety awareness       PT Treatment Interventions DME  instruction;Gait training;Therapeutic exercise;Patient/family education;Therapeutic activities;Stair training;Balance training;Functional mobility training    PT Goals (Current goals can be found in the Care Plan section)  Acute Rehab PT Goals Patient Stated Goal: To return to independent PT Goal Formulation: With patient Time For Goal Achievement: 05/19/17 Potential to Achieve Goals: Good    Frequency Min 3X/week   Barriers to discharge        Co-evaluation               AM-PAC PT "6 Clicks" Daily Activity  Outcome Measure Difficulty turning over in bed (including adjusting bedclothes, sheets and blankets)?: None Difficulty moving from lying on back to sitting on the side of the bed? : None Difficulty sitting down on and standing up from a chair with arms (e.g., wheelchair, bedside commode, etc,.)?: A Little Help needed moving to and from a bed to chair (including a wheelchair)?: A Little Help needed walking in hospital room?: A Little Help needed climbing 3-5 steps with a railing? : A Little 6 Click Score: 20    End of Session Equipment Utilized During Treatment: Gait belt Activity Tolerance: Patient tolerated treatment well Patient left: in chair;with call bell/phone within reach (hand off to OT)   PT Visit Diagnosis: Difficulty in walking, not elsewhere classified (R26.2);Pain Pain - Right/Left: Right Pain - part of body: Leg    Time: 1015-1031 PT Time Calculation (min) (ACUTE ONLY): 16 min   Charges:   PT Evaluation $PT Eval Moderate Complexity: 1 Procedure     PT G CodesMagda Kiel, [place] 05/16/2017   Reginia Naas 05/16/2017, 10:56 AM

## 2017-05-16 NOTE — Progress Notes (Addendum)
     Subjective  - Ambulated this am in the halls, tolerating some PO's without nausea.  Pain is still an issue.   Objective 96/67 77 98.9 F (37.2 C) (Oral) 16 92%  Intake/Output Summary (Last 24 hours) at 05/16/17 0726 Last data filed at 05/16/17 0310  Gross per 24 hour  Intake             2880 ml  Output             1250 ml  Net             1630 ml    Palpable AT, doppler PT/DP/Peroneal Ecchymosis at thew vein harvest site, all incisions without frank hematoma.  Assessment/Planning: POD # 1  PROCEDURE: 1.  Right above-the-knee popliteal artery to below-the-knee popliteal artery bypass with ipsilateral non-reversed greater saphenous vein  2.  Linton patch adjunct to below-the-knee popliteal artery Will order rolling walker for home. Transfer to 2W Possible home tomorrow if pain controlled on PO medications.  Laurence Slate [facility] 05/16/2017 7:26 AM    Addendum  I have independently interviewed and examined the patient, and I agree with the physician assistant's findings.  Palpable AT this AM.  Echymosis at vein harvest site.  All incision c/d/i.  Toes pink and viable.  Some distal 2nd toe ischemia stable.  - ABI - ACE wrap to R leg after ABI completed   Adele Barthel, MD, FACS [facility] [place]: [phone] Pager: [phone]  05/16/2017, 7:35 AM   --  Laboratory Lab Results:  Recent Labs  05/14/17 1559 05/15/17 0307  WBC 7.7 7.5  HGB 14.6 13.6  HCT 42.3 40.1  PLT 314 308   BMET  Recent Labs  05/14/17 1041 05/14/17 1559 05/15/17 0307  NA 138  --  139  K 3.6  --  3.3*  CL 99*  --  103  CO2  --   --  29  GLUCOSE 98  --  93  BUN 11  --  7  CREATININE 0.80 0.71 0.70  CALCIUM  --   --  8.9    COAG Lab Results  Component Value Date   INR 1.02 05/15/2017   INR 0.88 09/17/2014   INR 0.93 09/05/2013   No results found for: PTT

## 2017-05-16 NOTE — Progress Notes (Deleted)
Attempt to call report to Ronalee Belts, RN 2W 25.

## 2017-05-17 ENCOUNTER — Inpatient Hospital Stay (HOSPITAL_COMMUNITY): Payer: Medicare Other

## 2017-05-17 DIAGNOSIS — Z9889 Other specified postprocedural states: Secondary | ICD-10-CM

## 2017-05-17 NOTE — Progress Notes (Signed)
   Daily Progress Note   Assessment/Planning:   POD #2 s/p R pop-pop BPG w/ ips NR GSV   Pt having some increased pain today  Once ambulating better and home health issues arranged can be discharged, likely tomorrow or weekend   Subjective  - 2 Days Post-Op   Increased pain, ambulation still limited   Objective   Vitals:   05/16/17 1700 05/16/17 1720 05/16/17 1947 05/17/17 0448  BP: 112/69  135/73 (!) 147/86  Pulse: 81  80 87  Resp: 17  18 18   Temp: 98.9 F (37.2 C) 98.9 F (37.2 C) 99.3 F (37.4 C) 98.3 F (36.8 C)  TempSrc: Oral Oral Oral Oral  SpO2: 94%  96% 96%  Weight:      Height:         Intake/Output Summary (Last 24 hours) at 05/17/17 4332 Last data filed at 05/17/17 0700  Gross per 24 hour  Intake             1960 ml  Output             1550 ml  Net              410 ml    PULM  CTAB  CV  RRR  GI  soft, NTND  [employer] c/d/i, echymosis in dependent portions of R leg, faintly palpable AT and PT  NEURO DNVI    Laboratory   CBC CBC Latest Ref Rng & Units 05/15/2017 05/14/2017 05/14/2017  WBC 4.0 - 10.5 K/uL 7.5 7.7 -  Hemoglobin 12.0 - 15.0 g/dL 13.6 14.6 16.3(H)  Hematocrit 36.0 - 46.0 % 40.1 42.3 48.0(H)  Platelets 150 - 400 K/uL 308 314 -    BMET    Component Value Date/Time   NA 139 05/15/2017 0307   K 3.3 (L) 05/15/2017 0307   CL 103 05/15/2017 0307   CO2 29 05/15/2017 0307   GLUCOSE 93 05/15/2017 0307   BUN 7 05/15/2017 0307   CREATININE 0.70 05/15/2017 0307   CREATININE 0.84 06/23/2014 0935   CALCIUM 8.9 05/15/2017 0307   GFRNONAA >60 05/15/2017 0307   GFRAA >60 05/15/2017 9518     Adele Barthel, MD, FACS [facility] [place] Office: [phone] Pager: [phone]  05/17/2017, 9:21 AM

## 2017-05-17 NOTE — Progress Notes (Signed)
Per CCMD pt with run of RVR; asymptomatic presentation.

## 2017-05-17 NOTE — Progress Notes (Signed)
Physical Therapy Treatment Patient Details Name: Brenda Carney MRN: [id number] DOB: Dec 05, 1951 Today's Date: 05/17/2017    History of Present Illness Patient is a 66 y/o female admitted with R foot ischemia not s/p Right above-the-knee popliteal artery to below-the-knee popliteal artery bypass.  PMH positive for HTN, active smoking, cerebral aneurysm, CVA, headache,  hyperlipidemia, sleep apnea- doe snot wear CPAP, TIA.    PT Comments    Pt very pleasant and eager to return to her dogs and horses. Pt able to increase gait speed and stride with decreased reliance on Rw. Educated for gait, transfers and HEP with pt able to demonstrate understanding. Pt should progress quickly and no longer feel HHPT warranted with pt in agreement. Encouraged HEP and ambulation acutely. Will follow.    Follow Up Recommendations  No PT follow up     Equipment Recommendations  Rolling walker with 5" wheels    Recommendations for Other Services       Precautions / Restrictions Precautions Precautions: Fall    Mobility  Bed Mobility Overal bed mobility: Modified Independent                Transfers Overall transfer level: Modified independent                  Ambulation/Gait Ambulation/Gait assistance: Supervision Ambulation Distance (Feet): 650 Feet Assistive device: Rolling walker (2 wheeled) Gait Pattern/deviations: Step-through pattern;Decreased stance time - right   Gait velocity interpretation: Below normal speed for age/gender General Gait Details: pt initially with step to pattern and decreased dorsiflexion RLE. With cues pt able to achieve step through pattern with heel strike, increased speed and walked last 10' without RW without LOB   Stairs Stairs: Yes   Stair Management: Step to pattern;Sideways;One rail Right Number of Stairs: 10 General stair comments: cues for initial sequence with pt able  to demonstrate after education  Wheelchair Mobility    Modified  Rankin (Stroke Patients Only)       Balance Overall balance assessment: No apparent balance deficits (not formally assessed)                                          Cognition Arousal/Alertness: Awake/alert Behavior During Therapy: WFL for tasks assessed/performed Overall Cognitive Status: Within Functional Limits for tasks assessed                                        Exercises General Exercises - Lower Extremity Long Arc Quad: AROM;Right;Seated;20 reps Hip Flexion/Marching: AROM;Right;Seated;20 reps Toe Raises: AROM;20 reps;Right;Seated    General Comments        Pertinent Vitals/Pain Pain Score: 2  Pain Location: R foot/leg Pain Descriptors / Indicators: Sore Pain Intervention(s): Monitored during session    Home Living                      Prior Function            PT Goals (current goals can now be found in the care plan section) Progress towards PT goals: Progressing toward goals    Frequency           PT Plan Discharge plan needs to be updated    Co-evaluation  AM-PAC PT "6 Clicks" Daily Activity  Outcome Measure  Difficulty turning over in bed (including adjusting bedclothes, sheets and blankets)?: None Difficulty moving from lying on back to sitting on the side of the bed? : None Difficulty sitting down on and standing up from a chair with arms (e.g., wheelchair, bedside commode, etc,.)?: None Help needed moving to and from a bed to chair (including a wheelchair)?: None Help needed walking in hospital room?: A Little Help needed climbing 3-5 steps with a railing? : None 6 Click Score: 23    End of Session   Activity Tolerance: Patient tolerated treatment well Patient left: in chair;with call bell/phone within reach Nurse Communication: Mobility status PT Visit Diagnosis: Difficulty in walking, not elsewhere classified (R26.2)     Time: 4076-8088 PT Time Calculation (min)  (ACUTE ONLY): 20 min  Charges:  $Gait Training: 8-22 mins                    G Codes:       Elwyn Reach, [place]    [place] 05/17/2017, 11:34 AM

## 2017-05-17 NOTE — Progress Notes (Signed)
VASCULAR LAB PRELIMINARY  ARTERIAL  ABI completed: Right ABI of 0.92 is suggestive of mild arterial occlusive disease at rest. Left ABI of 1.06 is suggestive of arterial flow within normal limits at rest.   RIGHT    LEFT    PRESSURE WAVEFORM  PRESSURE WAVEFORM  BRACHIAL 138 Triphasic BRACHIAL 141 Triphasic  DP 124 Biphasic DP 149 Triphasic  PT 130 Biphasic PT 142 Triphasic    RIGHT LEFT  ABI 0.92 1.06     Legrand Como, RVT 05/17/2017, 10:39 AM

## 2017-05-17 NOTE — Care Management Important Message (Signed)
Important Message  Patient Details  Name: Brenda Carney MRN: [id number] Date of Birth: Jun 15, 1951   Medicare Important Message Given:  Yes    Antwan Pandya [place] 05/17/2017, 10:22 AM

## 2017-05-18 ENCOUNTER — Telehealth: Payer: Self-pay | Admitting: Vascular Surgery

## 2017-05-18 MED ORDER — OXYCODONE-ACETAMINOPHEN 5-325 MG PO TABS: 1.0000 | ORAL_TABLET | ORAL | 0 refills | Status: DC | PRN

## 2017-05-18 MED ORDER — OXYCODONE-ACETAMINOPHEN 5-325 MG PO TABS: 1.0000 | ORAL_TABLET | Freq: Four times a day (QID) | ORAL | 0 refills | Status: DC | PRN

## 2017-05-18 NOTE — Care Management Note (Signed)
Case Management Note Previous CM note initiated by Zenon Mayo, RN--05/16/2017, 2:35 PM   Patient Details   Name: Brenda Carney MRN: [id number] Date of Birth: [birth year]  Subjective/Objective:    From home alone, she has two friendly dogs. s/p Right above-the-knee popliteal artery to below-the-knee popliteal artery bypass.  per pt eval rec HHPT, and rolling walker , 3 n 1.  She has medicare [employer].  Patient chose [facility] from [place] list. Referral made to [facility].  Soc will begin 24 to 48 hrs post dc.  Patient does not want the 3 n 1.    PCP listed is Rupashree Varadarajan               Action/Plan: NCM will follow for dc needs.   Expected Discharge Date:  05/18/17               Expected Discharge Plan:  Progress  In-House Referral:     Discharge planning Services  CM Consult  Post Acute Care Choice:  Durable Medical Equipment, Home Health Choice offered to:  Patient  DME Arranged:  3-N-1, Walker rolling DME Agency:  [place]:  PT [place]:  [place]  Status of Service:  Completed, signed off  If discussed at [place] of Stay Meetings, dates discussed:    Discharge Disposition: home/home health   Additional Comments:  05/18/17- [place], CM- pt for d/c home today - have notified Santiago Glad with [facility]- and requested RW for delivery.   Dahlia Client [place], RN 05/18/2017, 10:51 AM [phone]

## 2017-05-18 NOTE — Progress Notes (Signed)
  Progress Note    05/18/2017 8:04 AM 3 Days Post-Op  Subjective:  Feeling much better. Good appetite.   Afebrile HR 70's NSR 702'O-378'H systolic 88% RA  Vitals:   05/17/17 2051 05/18/17 0351  BP: 118/80 (!) 140/91  Pulse: 72 77  Resp: 18 18  Temp: 98 F (36.7 C) 98.8 F (37.1 C)    Physical Exam: Lungs:  Non labored Incisions:  Healing nicely Extremities:  Right foot is warm and well perfused.   CBC    Component Value Date/Time   WBC 7.5 05/15/2017 0307   RBC 4.09 05/15/2017 0307   HGB 13.6 05/15/2017 0307   HCT 40.1 05/15/2017 0307   PLT 308 05/15/2017 0307   MCV 98.0 05/15/2017 0307   MCH 33.3 05/15/2017 0307   MCHC 33.9 05/15/2017 0307   RDW 13.2 05/15/2017 0307   LYMPHSABS 2.3 09/17/2014 1157   MONOABS 0.6 09/17/2014 1157   EOSABS 0.2 09/17/2014 1157   BASOSABS 0.1 09/17/2014 1157    BMET    Component Value Date/Time   NA 139 05/15/2017 0307   K 3.3 (L) 05/15/2017 0307   CL 103 05/15/2017 0307   CO2 29 05/15/2017 0307   GLUCOSE 93 05/15/2017 0307   BUN 7 05/15/2017 0307   CREATININE 0.70 05/15/2017 0307   CREATININE 0.84 06/23/2014 0935   CALCIUM 8.9 05/15/2017 0307   GFRNONAA >60 05/15/2017 0307   GFRAA >60 05/15/2017 0307    INR    Component Value Date/Time   INR 1.02 05/15/2017 0307     Intake/Output Summary (Last 24 hours) at 05/18/17 0804 Last data filed at 05/18/17 0300  Gross per 24 hour  Intake             1080 ml  Output             3000 ml  Net            -1920 ml     Assessment:  66 y.o. female is s/p:  1.  Right above-the-knee popliteal artery to below-the-knee popliteal artery bypass with ipsilateral non-reversed greater saphenous vein  2.  Linton patch adjunct to below-the-knee popliteal artery  3 Days Post-Op  Plan: -pt doing much better this am.  Her right foot is warm and well perfused.   -pain is much improved and gets better with more walking -one episode of tachycardia yesterday of 130's-pt was  asymptomatic -will discharge home later this morning. -discussed importance of smoking cessation.  She has been quit for 2 weeks and strongly encouraged her to continue this.  -she will f/u with Dr. Bridgett Larsson in 2-3 weeks.   Leontine Locket, PA-C [facility] [phone] 05/18/2017 8:04 AM

## 2017-05-18 NOTE — Telephone Encounter (Signed)
Spoke to pt for appt 6/15 mailed letter

## 2017-05-18 NOTE — Progress Notes (Signed)
Discharged to home with family office visits in place teaching done  

## 2017-05-18 NOTE — Discharge Summary (Signed)
Discharge Summary     Brenda Carney 11/29/1951 66 y.o. female  [id number]  Admission Date: 05/14/2017  Discharge Date: 05/18/17  Physician: Conrad  Hills, MD  Admission Diagnosis: Critical lower limb ischemia [I99.8]   HPI:   This is a 66 y.o. female  who presents with chief complaint: right foot pain.  Onset of symptom occurred over one year ago, some pain in right foot with walking.  Over last two months, pt has had severe cramping in R calf that she thought was due to a calf muscle injury related to her running.  Pain is described as cramping, severity 5-10/10, and associated with ambulation and rest.  Patient has attempted to treat this pain with rest.  The patient has no rest pain symptoms also and progressing darkness in right toes.  Atherosclerotic risk factors include: HTN, active smoking.  Hospital Course:  The patient was admitted to the hospital and taken to the [facility] lab on 05/14/17 and underwent: 1.  Left common femoral artery cannulation under ultrasound guidance 2.  Placement of catheter in aorta 3.  Aortogram 4.  Second order arterial selection 5.  Right leg runoff via catheter 6.  Left leg runoff via sheath 7.  Conscious sedation for 30 minutes  The next day, the pt was taken to the operating room on 05/15/2017 and underwent: 1.  Right above-the-knee popliteal artery to below-the-knee popliteal artery bypass with ipsilateral non-reversed greater saphenous vein  2.  Linton patch adjunct to below-the-knee popliteal artery    Intraoperative findings as follows:  FINDING(S): 1.  Atherosclerotic plaque evident in above-the-knee popliteal artery 2.  Calcific anterior plaque in above-the-knee popliteal artery requiring endarterectomy and Linton patch placement 3.  Dopplerable anterior tibial artery and posterior tibial artery signals at end of case: signal abates with bypass compression  The pt tolerated the procedure well and was transported to the PACU in good  condition.   By POD 1, she was doing well.  She was transferred to the telemetry unit.    On POD 2, she was doing well, but was having some increased pain.  She was kept in the hospital another day.  On POD 3, she was doing much better and pain much better controlled.  She did have an increased heart rate in the 130's x one episode and was asymptomatic.  Discussed with Dr. Donnetta Hutching and nothing to do given one episode and asymptomatic. She is discharged home.    The remainder of the hospital course consisted of increasing mobilization and increasing intake of solids without difficulty.  CBC:    Component Value Date/Time   WBC 7.5 05/15/2017 0307   RBC 4.09 05/15/2017 0307   HGB 13.6 05/15/2017 0307   HCT 40.1 05/15/2017 0307   PLT 308 05/15/2017 0307   MCV 98.0 05/15/2017 0307   MCH 33.3 05/15/2017 0307   MCHC 33.9 05/15/2017 0307   RDW 13.2 05/15/2017 0307   LYMPHSABS 2.3 09/17/2014 1157   MONOABS 0.6 09/17/2014 1157   EOSABS 0.2 09/17/2014 1157   BASOSABS 0.1 09/17/2014 1157    BMET    Component Value Date/Time   NA 139 05/15/2017 0307   K 3.3 (L) 05/15/2017 0307   CL 103 05/15/2017 0307   CO2 29 05/15/2017 0307   GLUCOSE 93 05/15/2017 0307   BUN 7 05/15/2017 0307   CREATININE 0.70 05/15/2017 0307   CREATININE 0.84 06/23/2014 0935   CALCIUM 8.9 05/15/2017 0307   GFRNONAA >60 05/15/2017 0307   GFRAA >60  05/15/2017 0307     Discharge Instructions    Call MD for:  redness, tenderness, or signs of infection (pain, swelling, bleeding, redness, odor or green/yellow discharge around incision site)    Complete by:  As directed    Call MD for:  severe or increased pain, loss or decreased feeling  in affected limb(s)    Complete by:  As directed    Call MD for:  temperature >100.5    Complete by:  As directed    Driving Restrictions    Complete by:  As directed    No driving for 2 weeks   Lifting restrictions    Complete by:  As directed    No heavy lifting for 4 weeks    Resume previous diet    Complete by:  As directed       Discharge Diagnosis:  Critical lower limb ischemia [I99.8]  Secondary Diagnosis: Patient Active Problem List   Diagnosis Date Noted  . Critical lower limb ischemia 05/14/2017  . Preoperative cardiovascular examination   . PAD (peripheral artery disease) ([place])   . Dyslipidemia   . S/P lumbar spinal fusion 09/30/2014  . Essential hypertension   . Hyperlipidemia with target LDL less than 100   . Cerebrovascular disease   . Headache(784.0) 10/07/2013  . TIA (transient ischemic attack) 09/05/2013  . Tobacco abuse 09/05/2013   Past Medical History:  Diagnosis Date  . Arthritis    "thumbs; hips" (05/14/2017)  . 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 Cerebrovascular disease    Carotid dopplers which showed no significant increase in velocities, extremely minimal on the left, antegrade vertebral bilaterally.  . Cervical spondylosis   . Chronic lower back pain   . COPD (chronic obstructive pulmonary disease) ([place])    "little bit" (05/14/2017)  . Exercise-induced asthma with acute exacerbation    "only in very hot weather" (05/14/2017)  . GERD (gastroesophageal reflux disease)   . ATFTDDUK(025.4)    "monthly" (05/14/2017)  . Heart murmur    Echo 08/2013: Suboptimal imaging. EF roughly 55%. Grade 1 diastolic dysfunction.  Marland Kitchen History of IBS    resovlved  . History of kidney stones   . History of stress test 03/2008   the post stress myocardial perfusion images show a normal pattern of perfusion in all regions. The post ventricle is normal in size. There is no scintigraphic evidence of inducible myocardial ichemia. There is prominent gut uptake activity noted in the infero-apical region.  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 Hyperlipidemia with target LDL less than 100   . Hypertension, benign   . Lumbosacral spondylosis   . Migraine  headache    "were monthly; none recently" (05/14/2017)  . PAD (peripheral artery disease) ([place])   . Pneumonia "several times"  . Sleep apnea    does not wear CPAP; "think it was medication related" (05/14/2017)  . TIA (transient ischemic attack) 09/2011   "several"     Allergies as of 05/18/2017      Reactions   Lyrica [pregabalin] Other (See Comments)   DRY MOUTH   Penicillins Swelling   Childhood reaction- tongue swelled Has patient had a PCN reaction causing immediate rash, facial/tongue/throat swelling, SOB or lightheadedness with hypotension: Yes Has patient had a PCN reaction causing severe rash involving mucus membranes or skin necrosis: No Has patient had a PCN reaction that required hospitalization: No Has  patient had a PCN reaction occurring within the last 10 years: No If all of the above answers are "NO", then may proceed with Cephalosporin use.      Medication List    TAKE these medications   amLODipine 5 MG tablet Commonly known as:  NORVASC TAKE 1 TABLET ONCE DAILY.   aspirin EC 81 MG tablet Take 81 mg by mouth 2 (two) times daily.   COMBIVENT RESPIMAT 20-100 MCG/ACT Aers respimat Generic drug:  Ipratropium-Albuterol Inhale 2 puffs into the lungs every 6 (six) hours as needed for wheezing.   desvenlafaxine 100 MG 24 hr tablet Commonly known as:  PRISTIQ Take 100 mg by mouth daily.   fenofibrate 145 MG tablet Commonly known as:  TRICOR Take 145 mg by mouth daily.   KRILL OIL PO Take 1 capsule by mouth daily.   lisinopril-hydrochlorothiazide 20-12.5 MG tablet Commonly known as:  PRINZIDE,ZESTORETIC Take 2 tablets by mouth daily.   mometasone 50 MCG/ACT nasal spray Commonly known as:  NASONEX Place 2 sprays into the nose daily as needed (allergies).   multivitamin with minerals Tabs tablet Take 1 tablet by mouth daily.   mupirocin ointment 2 % Commonly known as:  BACTROBAN Apply 1 application topically as needed (infections).   OVER THE COUNTER  MEDICATION Take 4 capsules by mouth daily as needed (energy). SeroVital otc supplement   oxyCODONE-acetaminophen 5-325 MG tablet Commonly known as:  PERCOCET/ROXICET Take 1-2 tablets by mouth every 4 (four) hours as needed for moderate pain.   Pitavastatin Calcium 4 MG Tabs Take 1 tablet (4 mg total) by mouth daily.   potassium chloride 10 MEQ tablet Commonly known as:  K-DUR,KLOR-CON Take 10 mEq by mouth daily.   traMADol 50 MG tablet Commonly known as:  ULTRAM Take 100 mg by mouth 3 (three) times daily as needed for moderate pain.   zolpidem 10 MG tablet Commonly known as:  AMBIEN Take 15-20 mg by mouth at bedtime.            Durable Medical Equipment        Start     Ordered   05/16/17 1540  For home use only DME 3 n 1  Once     05/16/17 1539   05/16/17 0733  For home use only DME Walker rolling  Once    Question:  Patient needs a walker to treat with the following condition  Answer:  Post-operative state   05/16/17 0732      Prescriptions given: 1.  Roxicet #30 No Refill  Instructions: 1.  No driving for 2 weeks. 2.  No heavy lifting x 4 weeks. 3.  Shower daily starting 05/18/17  Disposition: home  Patient's condition: is Good  Follow up: 1. Dr. Bridgett Larsson in 2-3 weeks   Leontine Locket, PA-C [facility] [phone] 05/18/2017  9:23 AM    Addendum  This patient presented in the office with acute worsening of chronic ischemia to right foot.  Her angiogram was done which demonstrated occlusion of popliteal artery at the knee level.  The patient underwent right above the knee to below the knee popliteal bypass with non-reversed ipsilateral greater saphenous vein.  The patient had good resolution of sx with increase in ABI from 0.32 to 0.92.  The patient will be seen in the office in two to check on incisions.   Adele Barthel, MD, FACS [facility] [place] Office: [phone] Pager: [phone]  05/22/2017, 11:04  AM    - For  VQI Registry use ---   Post-op:  Wound infection: No  Graft infection: No  Transfusion: No    If yes, n/a units given New Arrhythmia: No Ipsilateral amputation: No, [ ]  Minor, [ ]  BKA, [ ]  AKA Discharge patency: [x ] Primary, [ ]  Primary assisted, [ ]  Secondary, [ ]  Occluded Patency judged by: [x ] Dopper only, [ ]  Palpable graft pulse, []  Palpable distal pulse, [ ]  ABI inc. > 0.15, [ ]  Duplex Discharge ABI: R 0.92, L 1.06 D/C Ambulatory Status: Ambulatory  Complications: MI: No, [ ]  Troponin only, [ ]  EKG or Clinical CHF: No Resp failure:No, [ ]  Pneumonia, [ ]  Ventilator Chg in renal function: No, [ ]  Inc. Cr > 0.5, [ ]  Temp. Dialysis,  [ ]  Permanent dialysis Stroke: No, [ ]  Minor, [ ]  Major Return to OR: No  Reason for return to OR: [ ]  Bleeding, [ ]  Infection, [ ]  Thrombosis, [ ]  Revision  Discharge medications: Statin use:  yes ASA use:  yes Plavix use:  no Beta blocker use: no CCB use:  Yes ACEI use:   yes ARB use:  no Coumadin use: no

## 2017-05-18 NOTE — Telephone Encounter (Signed)
-----   Message from Mena Goes, RN sent at 05/18/2017  9:23 AM EDT ----- Regarding: 2-3 weeks   ----- Message ----- From: Gabriel Earing, PA-C Sent: 05/18/2017   9:20 AM To: Vvs Charge Pool  S/p right pop pop bypass.  F/u with Dr. Bridgett Larsson in 2-3 weeks.  Thanks, Aldona Bar

## 2017-05-22 ENCOUNTER — Telehealth: Payer: Self-pay | Admitting: *Deleted

## 2017-05-22 NOTE — Telephone Encounter (Signed)
Patient called stating that she was experiencing yellowish drainage from the upper incision and this appears to be warm as well and slightly swollen.  An appointment was scheduled with Vinnie Level, NP tomorrow, 05/23/2017 at 1:15.

## 2017-05-23 ENCOUNTER — Encounter: Payer: Self-pay | Admitting: Family

## 2017-05-23 ENCOUNTER — Ambulatory Visit (INDEPENDENT_AMBULATORY_CARE_PROVIDER_SITE_OTHER): Payer: Self-pay | Admitting: Family

## 2017-05-23 VITALS — BP 108/78 | HR 89 | Temp 98.1°F | Resp 20 | Ht 65.0 in | Wt 148.0 lb

## 2017-05-23 DIAGNOSIS — T148XXA Other injury of unspecified body region, initial encounter: Secondary | ICD-10-CM

## 2017-05-23 DIAGNOSIS — I70229 Atherosclerosis of native arteries of extremities with rest pain, unspecified extremity: Secondary | ICD-10-CM

## 2017-05-23 DIAGNOSIS — I998 Other disorder of circulatory system: Secondary | ICD-10-CM

## 2017-05-23 DIAGNOSIS — L03115 Cellulitis of right lower limb: Secondary | ICD-10-CM

## 2017-05-23 DIAGNOSIS — Z87891 Personal history of nicotine dependence: Secondary | ICD-10-CM

## 2017-05-23 DIAGNOSIS — L24A9 Irritant contact dermatitis due friction or contact with other specified body fluids: Secondary | ICD-10-CM

## 2017-05-23 MED ORDER — CIPROFLOXACIN HCL 500 MG PO TABS: 500.0000 mg | ORAL_TABLET | Freq: Two times a day (BID) | ORAL | 0 refills | Status: DC

## 2017-05-23 NOTE — Patient Instructions (Signed)

## 2017-05-23 NOTE — Progress Notes (Signed)
Postoperative Visit   History of Present Illness  Brenda Carney is a 66 y.o. year old female who is s/p right above-the-knee popliteal artery to below-the-knee popliteal artery bypass with ipsilateral non-reversed greater saphenous vein, and Linton patch adjunct to below-the-knee popliteal artery on 05-15-17 by Dr. Bridgett Larsson for right foot ischemia with imminent development of gangrene.  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  FINDING(S): 1.  Atherosclerotic plaque evident in above-the-knee popliteal artery 2.  Calcific anterior plaque in above-the-knee popliteal artery requiring endarterectomy and Linton patch placement 3.  Dopplerable anterior tibial artery and posterior tibial artery signals at end of case: signal abates with bypass compression  She returns today with c/o yellowish watery drainage from the upper incision; the lower incision appears to be warm, red, as well and slightly swollen. She denies fever or chills.  The patient notes resolution of lower extremity symptoms. She is walking a great deal.   The patient's right leg, medial aspect incisions are healing. The patient is able to complete their activities of daily living.   She does not have DM. She quit smoking on 04-24-17 after 30 years of smoking.   For VQI Use Only  PRE-ADM LIVING: Home  AMB STATUS: Ambulatory  Past Medical History:  Diagnosis Date  . Arthritis    "thumbs; hips" (05/14/2017)  . 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 Cerebrovascular disease    Carotid dopplers which showed no significant increase in velocities, extremely minimal on the left, antegrade vertebral bilaterally.  . Cervical spondylosis   . Chronic lower back pain   . COPD (chronic obstructive pulmonary disease) ([place])    "little bit" (05/14/2017)  . Exercise-induced asthma with acute exacerbation    "only in  very hot weather" (05/14/2017)  . GERD (gastroesophageal reflux disease)   . LFYBOFBP(102.5)    "monthly" (05/14/2017)  . Heart murmur    Echo 08/2013: Suboptimal imaging. EF roughly 55%. Grade 1 diastolic dysfunction.  Marland Kitchen History of IBS    resovlved  . History of kidney stones   . History of stress test 03/2008   the post stress myocardial perfusion images show a normal pattern of perfusion in all regions. The post ventricle is normal in size. There is no scintigraphic evidence of inducible myocardial ichemia. There is prominent gut uptake activity noted in the infero-apical region.  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 Hyperlipidemia with target LDL less than 100   . Hypertension, benign   . Lumbosacral spondylosis   . Migraine headache    "were monthly; none recently" (05/14/2017)  . PAD (peripheral artery disease) ([place])   . Pneumonia "several times"  . Sleep apnea    does not wear CPAP; "think it was medication related" (05/14/2017)  . TIA (transient ischemic attack) 09/2011   "several"    Past Surgical History:  Procedure Laterality Date  . ABDOMINAL AORTOGRAM W/LOWER EXTREMITY N/A 05/14/2017   Procedure: Abdominal Aortogram w/Lower Extremity;  Surgeon: Conrad Sycamore, MD;  Location: [facility];  Service: Cardiovascular;  Laterality: N/A;  . ACROMIO-CLAVICULAR JOINT REPAIR Left 1990s   "I think it was called an AC joint removal"  . BACK SURGERY    . BYPASS GRAFT POPLITEAL TO POPLITEAL Right 05/15/2017   Procedure: BYPASS GRAFT ABOVE KNEE POPLITEAL TO BELOW KNEE POPLITEAL ARTERY;  Surgeon: Conrad [place],  MD;  Location: [facility];  Service: Vascular;  Laterality: Right;  . FOOT FRACTURE SURGERY Bilateral    multiple procedures  . FRACTURE SURGERY    . MAXIMUM ACCESS (MAS)POSTERIOR LUMBAR INTERBODY FUSION (PLIF) 1 LEVEL N/A 09/30/2014   Procedure: FOR MAXIMUM ACCESS (MAS) POSTERIOR LUMBAR INTERBODY FUSION (PLIF) 1 LEVEL;  Surgeon: Eustace Moore, MD;   Location: [facility];  Service: Neurosurgery;  Laterality: N/A;  FOR MAXIMUM ACCESS (MAS) POSTERIOR LUMBAR INTERBODY FUSION (PLIF) 1 LEVEL LUMBAR 4-5  . MULTIPLE TOOTH EXTRACTIONS Right 04/2017    Social History   Social History  . Marital status: Widowed    Spouse name: Brenda Carney   . Number of children: 0  . Years of education: COLLEGE   Occupational History  . Horse Breeder     Social History Main Topics  . Smoking status: Former Smoker    Packs/day: 0.50    Years: 30.00    Types: Cigarettes    Quit date: 04/24/2017  . Smokeless tobacco: Never Used  . Alcohol use 1.8 oz/week    3 Glasses of wine per week  . Drug use: No  . Sexual activity: No   Other Topics Concern  . Not on file   Social History Narrative   Patient lives at home with her husband Brenda Carney. Patient has no children.    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    Patient has BA degree.    Smokes 2-3 cigarettes /day -- former heavy smoker (failed "quitting") -- now says she has truly "QUIT"    Allergies  Allergen Reactions  . Lyrica [Pregabalin] Other (See Comments)    DRY MOUTH  . Penicillins Swelling    Childhood reaction- tongue swelled Has patient had a PCN reaction causing immediate rash, facial/tongue/throat swelling, SOB or lightheadedness with hypotension: Yes Has patient had a PCN reaction causing severe rash involving mucus membranes or skin necrosis: No Has patient had a PCN reaction that required hospitalization: No Has patient had a PCN reaction occurring within the last 10 years: No If all of the above answers are "NO", then may proceed with Cephalosporin use.     Current Outpatient Prescriptions on File Prior to Visit  Medication Sig Dispense Refill  . amLODipine (NORVASC) 5 MG tablet TAKE 1 TABLET ONCE DAILY. 30 tablet 7  . aspirin EC 81 MG tablet Take 81 mg by mouth 2 (two) times daily.    Marland Kitchen desvenlafaxine (PRISTIQ) 100 MG 24 hr tablet Take 100 mg by mouth daily.      . fenofibrate (TRICOR) 145 MG tablet Take 145 mg by mouth daily.   1  . Ipratropium-Albuterol (COMBIVENT RESPIMAT) 20-100 MCG/ACT AERS respimat Inhale 2 puffs into the lungs every 6 (six) hours as needed for wheezing.    Marland Kitchen KRILL OIL PO Take 1 capsule by mouth daily.    Marland Kitchen lisinopril-hydrochlorothiazide (PRINZIDE,ZESTORETIC) 20-12.5 MG per tablet Take 2 tablets by mouth daily.   0  . mometasone (NASONEX) 50 MCG/ACT nasal spray Place 2 sprays into the nose daily as needed (allergies).    . Multiple Vitamin (MULTIVITAMIN WITH MINERALS) TABS tablet Take 1 tablet by mouth daily.    . mupirocin ointment (BACTROBAN) 2 % Apply 1 application topically as needed (infections).   98  . OVER THE COUNTER MEDICATION Take 4 capsules by mouth daily as needed (energy). SeroVital otc supplement    . oxyCODONE-acetaminophen (PERCOCET/ROXICET) 5-325 MG tablet Take 1 tablet by mouth every 6 (  six) hours as needed for moderate pain. 30 tablet 0  . Pitavastatin Calcium 4 MG TABS Take 1 tablet (4 mg total) by mouth daily. 30 tablet 5  . potassium chloride (K-DUR,KLOR-CON) 10 MEQ tablet Take 10 mEq by mouth daily.    . traMADol (ULTRAM) 50 MG tablet Take 100 mg by mouth 3 (three) times daily as needed for moderate pain.     Marland Kitchen zolpidem (AMBIEN) 10 MG tablet Take 15-20 mg by mouth at bedtime.      No current facility-administered medications on file prior to visit.     Physical Examination  Vitals:   05/23/17 1312  BP: 108/78  Pulse: 89  Resp: 20  Temp: 98.1 F (36.7 C)  TempSrc: Oral  SpO2: 98%  Weight: 148 lb (67.1 kg)  Height: 5\' 5"  (1.651 m)   Body mass index is 24.63 kg/m.  Right leg medial aspect incisions: proximal incision is pruritic with no drainage. Middle incision at the distal tip has watery yellow drainage on her dressing that was removed. The medial right calf incision has a scant amount of dried blood; narrow perimeter of erythema surrounding this incision. Right leg with mild generalized  swelling, non pitting.  Pedal pulses: + Doppler signals at right PT, DP, and peroneal arteries. Left PT and DP pulses are palpable. No signs of ischemia.   Medical Decision Making  ALETA MANTERNACH is a 66 y.o. year old female who presents s/p right above-the-knee popliteal artery to below-the-knee popliteal artery bypass with ipsilateral non-reversed greater saphenous vein, and Linton patch adjunct to below-the-knee popliteal artery on 05-15-17.  Right leg middle incision is draining a small amount of serous fluid.  Dr. Scot Dock spoke with and examined pt. Mild cellulitis at right medial calf incision: Cipro 500 mg bid x 10 days.  Post operative right LE arterial bypass mild swelling of leg: Pt was instructed how to elevate feet above slightly flexed knees, feet above heart, overnight and 3-4x/day for 20 minutes. Continue graduated walking. Continue daily shower and gentle cleansing of incisions. Continue daily 81 mg ASA and a statin.   Follow up with Dr. Bridgett Larsson as already scheduled on 06-08-17.    The patient's bypass incisions are healing with resolution of pre-operative symptoms. I discussed in depth with the patient the nature of atherosclerosis, and emphasized the importance of maximal medical management including strict control of blood pressure, blood glucose, and lipid levels, obtaining regular exercise, and cessation of smoking.  The patient is aware that without maximal medical management the underlying atherosclerotic disease process will progress, limiting the benefit of any interventions.   Thank you for allowing [place] to participate in this patient's care.  Fabiola Mudgett, Sharmon Leyden, RN, MSN, FNP-C [facility] Office: [phone]  05/23/2017, 1:24 PM  Clinic MD: [place]/Early

## 2017-05-28 DIAGNOSIS — G894 Chronic pain syndrome: Secondary | ICD-10-CM | POA: Diagnosis not present

## 2017-05-28 DIAGNOSIS — Z79899 Other long term (current) drug therapy: Secondary | ICD-10-CM | POA: Diagnosis not present

## 2017-05-28 DIAGNOSIS — M47817 Spondylosis without myelopathy or radiculopathy, lumbosacral region: Secondary | ICD-10-CM | POA: Diagnosis not present

## 2017-05-28 DIAGNOSIS — M5137 Other intervertebral disc degeneration, lumbosacral region: Secondary | ICD-10-CM | POA: Diagnosis not present

## 2017-05-28 DIAGNOSIS — M961 Postlaminectomy syndrome, not elsewhere classified: Secondary | ICD-10-CM | POA: Diagnosis not present

## 2017-05-28 DIAGNOSIS — Z79891 Long term (current) use of opiate analgesic: Secondary | ICD-10-CM | POA: Diagnosis not present

## 2017-05-29 ENCOUNTER — Other Ambulatory Visit: Payer: Self-pay | Admitting: Physician Assistant

## 2017-05-29 ENCOUNTER — Ambulatory Visit
Admission: RE | Admit: 2017-05-29 | Discharge: 2017-05-29 | Disposition: A | Discharge status: Home / Self Care | Payer: Medicare Other | Source: Ambulatory Visit | Attending: Physician Assistant | Admitting: Physician Assistant

## 2017-05-29 DIAGNOSIS — G894 Chronic pain syndrome: Secondary | ICD-10-CM

## 2017-05-29 DIAGNOSIS — M25552 Pain in left hip: Secondary | ICD-10-CM | POA: Diagnosis not present

## 2017-05-29 DIAGNOSIS — M79606 Pain in leg, unspecified: Secondary | ICD-10-CM

## 2017-05-31 ENCOUNTER — Encounter: Payer: Self-pay | Admitting: Vascular Surgery

## 2017-05-31 NOTE — Progress Notes (Signed)
Postoperative Visit   History of Present Illness   Brenda Carney is a 66 y.o. year old female who presents for postoperative follow-up for: R AK to BK pop BPG w/ ips NR w/ GSV, linton patch (05/15/17).  The patient's wounds are healed.  The patient notes resolution of lower extremity symptoms.  The patient is able to complete their activities of daily living.  The patient's current symptoms are: swelling in R calf incision.  Pt notes some residual discomforted in R great toe.  Past Medical History:  Diagnosis Date  . Arthritis    "thumbs; hips" (05/14/2017)  . 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 Cerebrovascular disease    Carotid dopplers which showed no significant increase in velocities, extremely minimal on the left, antegrade vertebral bilaterally.  . Cervical spondylosis   . Chronic lower back pain   . COPD (chronic obstructive pulmonary disease) ([facility])    "little bit" (05/14/2017)  . Exercise-induced asthma with acute exacerbation    "only in very hot weather" (05/14/2017)  . GERD (gastroesophageal reflux disease)   . QASTMHDQ(222.9)    "monthly" (05/14/2017)  . History of IBS    resovlved  . History of kidney stones   . 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 Hyperlipidemia with target LDL less than 70    Mostly statin intolerant,. Currently on Livalo 4 mg  . Hypertension, benign   . Lumbosacral spondylosis   . Migraine headache    "were monthly; none recently" (05/14/2017)  . PAD (peripheral artery disease) ([facility])    Status post right above-the-knee-below the knee popliteal artery bypass; postop right ABI 0.96.  Marland Kitchen Pneumonia "several times"  . Sleep apnea    does not wear CPAP; "think it was medication related" (05/14/2017)  . TIA (transient ischemic attack) 09/2011   "several"    Past Surgical History:  Procedure Laterality Date  . ABDOMINAL AORTOGRAM W/LOWER EXTREMITY N/A  05/14/2017   Procedure: Abdominal Aortogram w/Lower Extremity;  Surgeon: Conrad Camp Douglas, MD;  Location: [facility];  Service: Cardiovascular;  Laterality: N/A;  . ACROMIO-CLAVICULAR JOINT REPAIR Left 1990s   "I think it was called an AC joint removal"  . BACK SURGERY    . BYPASS GRAFT POPLITEAL TO POPLITEAL Right 05/15/2017   Procedure: BYPASS GRAFT ABOVE KNEE POPLITEAL TO BELOW KNEE POPLITEAL ARTERY;  Surgeon: Conrad Churchtown, MD;  Location: [facility];  Service: Vascular;  Laterality: Right;  . FOOT FRACTURE SURGERY Bilateral    multiple procedures  . FRACTURE SURGERY    . MAXIMUM ACCESS (MAS)POSTERIOR LUMBAR INTERBODY FUSION (PLIF) 1 LEVEL N/A 09/30/2014   Procedure: FOR MAXIMUM ACCESS (MAS) POSTERIOR LUMBAR INTERBODY FUSION (PLIF) 1 LEVEL;  Surgeon: Eustace Moore, MD;  Location: [facility];  Service: Neurosurgery;  Laterality: N/A;  FOR MAXIMUM ACCESS (MAS) POSTERIOR LUMBAR INTERBODY FUSION (PLIF) 1 LEVEL LUMBAR 4-5  . MULTIPLE TOOTH EXTRACTIONS Right 04/2017  . NM MYOVIEW LTD  03/2008   LOW RISK. No ischemia or infarction.There is prominent gut uptake activity noted in the infero-apical region.  . TRANSTHORACIC ECHOCARDIOGRAM  09/07/2013   Done to evaluate TIA: Normal LV size and function. EF 55-60%. GR 1 DD. Mild left atrial dilation. Mildly calcified mitral leaflets. Otherwise normal.    Social History   Social History  . Marital status: Widowed    Spouse name: [name]   . Number  of children: 0  . Years of education: COLLEGE   Occupational History  . Horse Breeder     Social History Main Topics  . Smoking status: Former Smoker    Packs/day: 0.50    Years: 30.00    Types: Cigarettes    Quit date: 04/24/2017  . Smokeless tobacco: Never Used  . Alcohol use 1.8 oz/week    3 Glasses of wine per week  . Drug use: No  . Sexual activity: No   Other Topics Concern  . Not on file   Social History Narrative   Patient lives at home with her husband [name]. Patient has no  children.    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    Patient has BA degree.    Smokes 2-3 cigarettes /day -- former heavy smoker (failed "quitting") -- now says she has truly "QUIT"    Family History  Problem Relation Age of Onset  . Stroke Father   . Heart attack Mother   . Stroke Mother     Current Outpatient Prescriptions  Medication Sig Dispense Refill  . amLODipine (NORVASC) 5 MG tablet TAKE 1 TABLET ONCE DAILY. 30 tablet 7  . aspirin EC 81 MG tablet Take 81 mg by mouth 2 (two) times daily.    . ciprofloxacin (CIPRO) 500 MG tablet Take 1 tablet (500 mg total) by mouth 2 (two) times daily. 20 tablet 0  . desvenlafaxine (PRISTIQ) 100 MG 24 hr tablet Take 100 mg by mouth daily.    . Evolocumab (REPATHA SURECLICK) 160 MG/ML SOAJ Inject 140 mg into the skin every 14 (fourteen) days. 2 pen 11  . fenofibrate (TRICOR) 145 MG tablet Take 145 mg by mouth daily.   1  . Ipratropium-Albuterol (COMBIVENT RESPIMAT) 20-100 MCG/ACT AERS respimat Inhale 2 puffs into the lungs every 6 (six) hours as needed for wheezing.    Marland Kitchen KRILL OIL PO Take 1 capsule by mouth daily.    Marland Kitchen lisinopril-hydrochlorothiazide (PRINZIDE,ZESTORETIC) 20-12.5 MG per tablet Take 2 tablets by mouth daily.   0  . mometasone (NASONEX) 50 MCG/ACT nasal spray Place 2 sprays into the nose daily as needed (allergies).    . Multiple Vitamin (MULTIVITAMIN WITH MINERALS) TABS tablet Take 1 tablet by mouth daily.    . mupirocin ointment (BACTROBAN) 2 % Apply 1 application topically as needed (infections).   98  . OVER THE COUNTER MEDICATION Take 4 capsules by mouth daily as needed (energy). SeroVital otc supplement    . Pitavastatin Calcium 4 MG TABS Take 0.5 tablets (2 mg total) by mouth daily. 30 tablet 5  . potassium chloride (K-DUR,KLOR-CON) 10 MEQ tablet Take 10 mEq by mouth daily.    . traMADol (ULTRAM) 50 MG tablet Take 100 mg by mouth 3 (three) times daily as needed for moderate pain.     Marland Kitchen zolpidem (AMBIEN)  10 MG tablet Take 15-20 mg by mouth at bedtime.      No current facility-administered medications for this visit.      Allergies  Allergen Reactions  . Lyrica [Pregabalin] Other (See Comments)    DRY MOUTH  . Penicillins Swelling    Childhood reaction- tongue swelled Has patient had a PCN reaction causing immediate rash, facial/tongue/throat swelling, SOB or lightheadedness with hypotension: Yes Has patient had a PCN reaction causing severe rash involving mucus membranes or skin necrosis: No Has patient had a PCN reaction that required hospitalization: No Has patient had a PCN reaction occurring within  the last 10 years: No If all of the above answers are "NO", then may proceed with Cephalosporin use.     REVIEW OF SYSTEMS (negative unless checked):   Cardiac:  []  Chest pain or chest pressure? []  Shortness of breath upon activity? []  Shortness of breath when lying flat? []  Irregular heart rhythm?  Vascular:  []  Pain in calf, thigh, or hip brought on by walking? []  Pain in feet at night that wakes you up from your sleep? []  Blood clot in your veins? [x]  Leg swelling?  Pulmonary:  []  Oxygen at home? []  Productive cough? []  Wheezing?  Neurologic:  []  Sudden weakness in arms or legs? []  Sudden numbness in arms or legs? []  Sudden onset of difficult speaking or slurred speech? []  Temporary loss of vision in one eye? []  Problems with dizziness?  Gastrointestinal:  []  Blood in stool? []  Vomited blood?  Genitourinary:  []  Burning when urinating? []  Blood in urine?  Psychiatric:  []  Major depression  Hematologic:  []  Bleeding problems? []  Problems with blood clotting?  Dermatologic:  []  Rashes or ulcers?  Constitutional:  []  Fever or chills?  Ear/Nose/Throat:  []  Change in hearing? []  Nose bleeds? []  Sore throat?  Musculoskeletal:  []  Back pain? []  Joint pain? []  Muscle pain?  For VQI Use Only   PRE-ADM LIVING: Home  AMB STATUS:  Ambulatory   Physical Examination   Vitals:   06/08/17 1228  BP: 115/79  Pulse: (!) 114  Resp: 16  Temp: 98.2 F (36.8 C)  SpO2: 98%  Weight: 148 lb (67.1 kg)  Height: 5\' 5"  (1.651 m)   Pulmonary Sym exp, good air movt, CTAB, no rales, rhonchi, & wheezing  Cardiac RRR, Nl S1, S2, no Murmurs, rubs or gallops   RLE: Incisions are healed, ballotable fluid at R calf, wounds are healed, pedal pulses are palpable, R great toe with some mild subungal drainage   Medical Decision Making   SHAYLYNNE LUNT is a 66 y.o. year old female who presents s/p R pop-pop BPG w/ ips NR GSV, Linton patch  Pt has a likely seroma in R calf.  We discussed non-operative mgmt vs proceeding with incision and drainage. Pt favors proceeding with I&D R calf seroma. Risk and benefits discussed with patient.  Risk include but are not limited to bleeding, infection, and need for additional procedures. She will be scheduled for 26 JUN 18. Suspect probably limited paronychia involving R great toe.  Pt is going to get back into her Podiatrist to address such. If she has not done such by 26 JUN 18, will plan on examining the R great toe under anesthesia and possibly removing the R great toenail  vs partial excision as part of the above procedure. Thank you for allowing [place] to participate in this patient's care.   Adele Barthel, MD, FACS [facility] Office: [phone] Pager: [phone]

## 2017-06-04 ENCOUNTER — Ambulatory Visit (INDEPENDENT_AMBULATORY_CARE_PROVIDER_SITE_OTHER): Payer: Medicare Other | Admitting: Cardiology

## 2017-06-04 ENCOUNTER — Encounter: Payer: Self-pay | Admitting: Cardiology

## 2017-06-04 VITALS — BP 104/74 | HR 80 | Ht 65.0 in | Wt 147.0 lb

## 2017-06-04 DIAGNOSIS — Z720 Tobacco use: Secondary | ICD-10-CM | POA: Diagnosis not present

## 2017-06-04 DIAGNOSIS — E785 Hyperlipidemia, unspecified: Secondary | ICD-10-CM

## 2017-06-04 DIAGNOSIS — I679 Cerebrovascular disease, unspecified: Secondary | ICD-10-CM

## 2017-06-04 DIAGNOSIS — I739 Peripheral vascular disease, unspecified: Secondary | ICD-10-CM

## 2017-06-04 DIAGNOSIS — I10 Essential (primary) hypertension: Secondary | ICD-10-CM

## 2017-06-04 DIAGNOSIS — I998 Other disorder of circulatory system: Secondary | ICD-10-CM

## 2017-06-04 DIAGNOSIS — I70229 Atherosclerosis of native arteries of extremities with rest pain, unspecified extremity: Secondary | ICD-10-CM

## 2017-06-04 NOTE — Progress Notes (Signed)
PCP: Leeroy Cha, MD  Clinic Note: Chief Complaint  Patient presents with  . Follow-up    Overdue yearly.  Marland Kitchen Headache  . PAD    Recent right leg PAD surgery    HPI: Brenda Carney is a 66 y.o. female with a PMH below who presents today for Almost 2 year follow-up. She was following up with Dr. Rollene Fare for cardiac risk factors. 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  Brenda Carney was last seen on 09/16/2015. - Was doing well.   Recent Hospitalizations: Abdominal aortic angiogram with lower extremity runoff 05/14/2017 for critical limb - May 22 she then had above-knee to below-knee popliteal artery bypass --> she was referred to vascular surgery by Dr. Rollene Fare who saw her socially.  Studies Personally Reviewed - (if available, images/films reviewed: From Epic Chart or Care Everywhere)  05/10/17: Right ABI suggests severe right lower STEMI occlusive disease. Left relatively normal.  Abdominal Aortogram With Bilateral Lower Extremity Runoff 05/14/2017: Dr. Bridgett Larsson  Diffuse non-hemodynamically significant iliac artery disease except for a near occlusion of left internal iliac artery  Small-moderate penetrating aortic ulcer in infrarenal aortic segment  Patent left leg arterial system  Chronic popliteal artery occlusion with extensive collateralization reconstituting all tibials  Patient would be best served with a right popliteal to popliteal bypass to try to avoid development of imminent gangrene in her right toe  Some evidence of vasospastic disease in distal digital artery beds suggestive of Buerger's disease  05/15/17 Right  Above-The-Knee Popliteal Artery to Below-the-Knee Popliteal Artery Bypass with Ipsilateral (non-reversed) GSV --> Linton patch adjunct to below-the-knee popliteal artery.  Post-Op LEA Dopplers: R ABI 0.92 - suggests mild disease, L ABI 1.06.  ** She quit smoking when she was first diagnosed with PAD. Has not had a cigarette since  Interval History: Brenda Carney now presents back to reestablish cardiology care after her for full vascular procedure. She really has not had any true cardiac symptoms to speak of. She does note general mild muscle aches and body aches and is concerned that it may be related to her lipid medicine. She is not had any severe muscle cramps or. From a cardiac standpoint she has not had any resting or exertional chest tightness or pressure. She is now able to walk without any significant claudication symptoms. Basically she had had relatively sudden onset claudication symptoms over the last few months that led to referral to Dr. Bridgett Larsson.  No new PND, orthopnea or edema. No palpitations, lightheadedness, dizziness, weakness or syncope/near syncope. No TIA/amaurosis fugax symptoms.  No claudication since her surgery..  ROS: A comprehensive was performed. Review of Systems  Constitutional: Negative for malaise/fatigue.  HENT: Negative for congestion and nosebleeds.   Respiratory: Positive for cough and shortness of breath (Mild baseline).        Coughing is improving since she she quit smoking.  Gastrointestinal: Negative for blood in stool and melena.  Genitourinary: Negative for hematuria.  Musculoskeletal: Negative for joint pain.  Neurological: Positive for dizziness (Occasionally positional).  Psychiatric/Behavioral: Negative for memory loss. The patient is not nervous/anxious and does not have insomnia.   All other systems reviewed and are  negative.  I have reviewed and (if needed) personally updated the patient's problem list, medications, allergies, past medical and  surgical history, social and family history.   Past Medical History:  Diagnosis Date  . Arthritis    "thumbs; hips" (05/14/2017)  . 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 Cerebrovascular disease    Carotid dopplers which showed no significant increase in velocities, extremely minimal on the left, antegrade vertebral bilaterally.  . Cervical spondylosis   . Chronic lower back pain   . COPD (chronic obstructive pulmonary disease) ([place])    "little bit" (05/14/2017)  . Exercise-induced asthma with acute exacerbation    "only in very hot weather" (05/14/2017)  . GERD (gastroesophageal reflux disease)   . LTJQZESP(233.0)    "monthly" (05/14/2017)  . History of IBS    resovlved  . History of kidney stones   . 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 Hyperlipidemia with target LDL less than 70    Mostly statin intolerant,. Currently on Livalo 4 mg  . Hypertension, benign   . Lumbosacral spondylosis   . Migraine headache    "were monthly; none recently" (05/14/2017)  . PAD (peripheral artery disease) ([facility])    Status post right above-the-knee-below the knee popliteal artery bypass; postop right ABI 0.96.  Marland Kitchen Pneumonia "several times"  . Sleep apnea    does not wear CPAP; "think it was medication related" (05/14/2017)  . TIA (transient ischemic attack) 09/2011   "several"    Past Surgical History:  Procedure Laterality Date  . ABDOMINAL AORTOGRAM W/LOWER EXTREMITY N/A 05/14/2017   Procedure: Abdominal Aortogram w/Lower Extremity;  Surgeon: Conrad Alvin, MD;  Location: [facility];  Service: Cardiovascular;  Laterality: N/A;  . ACROMIO-CLAVICULAR JOINT REPAIR Left 1990s   "I think it was called an AC joint removal"  . BACK SURGERY    . BYPASS GRAFT POPLITEAL TO POPLITEAL Right 05/15/2017   Procedure: BYPASS GRAFT ABOVE KNEE POPLITEAL TO BELOW KNEE POPLITEAL ARTERY;  Surgeon: Conrad Grant, MD;   Location: [facility];  Service: Vascular;  Laterality: Right;  . FOOT FRACTURE SURGERY Bilateral    multiple procedures  . FRACTURE SURGERY    . MAXIMUM ACCESS (MAS)POSTERIOR LUMBAR INTERBODY FUSION (PLIF) 1 LEVEL N/A 09/30/2014   Procedure: FOR MAXIMUM ACCESS (MAS) POSTERIOR LUMBAR INTERBODY FUSION (PLIF) 1 LEVEL;  Surgeon: Eustace Moore, MD;  Location: [facility];  Service: Neurosurgery;  Laterality: N/A;  FOR MAXIMUM ACCESS (MAS) POSTERIOR LUMBAR INTERBODY FUSION (PLIF) 1 LEVEL LUMBAR 4-5  . MULTIPLE TOOTH EXTRACTIONS Right 04/2017  . NM MYOVIEW LTD  03/2008   LOW RISK. No ischemia or infarction.There is prominent gut uptake activity noted in the infero-apical region.  . TRANSTHORACIC ECHOCARDIOGRAM  09/07/2013   Done to evaluate TIA: Normal LV size and function. EF 55-60%. GR 1 DD. Mild left atrial dilation. Mildly calcified mitral leaflets. Otherwise normal.    Current Meds  Medication Sig  . amLODipine (NORVASC) 5 MG tablet TAKE 1 TABLET ONCE DAILY.  Marland Kitchen aspirin EC 81 MG tablet Take 81 mg by mouth 2 (two) times daily.  . ciprofloxacin (CIPRO) 500 MG tablet Take 1 tablet (500 mg total) by mouth 2 (two) times daily.  Marland Kitchen desvenlafaxine (PRISTIQ) 100 MG 24 hr tablet Take 100 mg by mouth daily.  . fenofibrate (TRICOR) 145 MG tablet Take 145 mg by mouth daily.   . Ipratropium-Albuterol (  COMBIVENT RESPIMAT) 20-100 MCG/ACT AERS respimat Inhale 2 puffs into the lungs every 6 (six) hours as needed for wheezing.  Marland Kitchen KRILL OIL PO Take 1 capsule by mouth daily.  Marland Kitchen lisinopril-hydrochlorothiazide (PRINZIDE,ZESTORETIC) 20-12.5 MG per tablet Take 2 tablets by mouth daily.   . mometasone (NASONEX) 50 MCG/ACT nasal spray Place 2 sprays into the nose daily as needed (allergies).  . Multiple Vitamin (MULTIVITAMIN WITH MINERALS) TABS tablet Take 1 tablet by mouth daily.  . mupirocin ointment (BACTROBAN) 2 % Apply 1 application topically as needed (infections).   Marland Kitchen OVER THE COUNTER MEDICATION Take 4 capsules by  mouth daily as needed (energy). SeroVital otc supplement  . Pitavastatin Calcium 4 MG TABS Take 1 tablet (4 mg total) by mouth daily.  . potassium chloride (K-DUR,KLOR-CON) 10 MEQ tablet Take 10 mEq by mouth daily.  . traMADol (ULTRAM) 50 MG tablet Take 100 mg by mouth 3 (three) times daily as needed for moderate pain.   Marland Kitchen zolpidem (AMBIEN) 10 MG tablet Take 15-20 mg by mouth at bedtime.     Allergies  Allergen Reactions  . Lyrica [Pregabalin] Other (See Comments)    DRY MOUTH  . Penicillins Swelling    Childhood reaction- tongue swelled Has patient had a PCN reaction causing immediate rash, facial/tongue/throat swelling, SOB or lightheadedness with hypotension: Yes Has patient had a PCN reaction causing severe rash involving mucus membranes or skin necrosis: No Has patient had a PCN reaction that required hospitalization: No Has patient had a PCN reaction occurring within the last 10 years: No If all of the above answers are "NO", then may proceed with Cephalosporin use.     Social History   Social History  . Marital status: Widowed    Spouse name: [name]   . Number of children: 0  . Years of education: COLLEGE   Occupational History  . Horse Breeder     Social History Main Topics  . Smoking status: Former Smoker    Packs/day: 0.50    Years: 30.00    Types: Cigarettes    Quit date: 04/24/2017  . Smokeless tobacco: Never Used  . Alcohol use 1.8 oz/week    3 Glasses of wine per week  . Drug use: No  . Sexual activity: No   Other Topics Concern  . None   Social History Narrative   Patient lives at home with her husband [name]. Patient has no children.    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    Patient has BA degree.    Smokes 2-3 cigarettes /day -- former heavy smoker (failed "quitting") -- now says she has truly "QUIT"    family history includes Heart attack in her mother; Stroke in her father and mother.  Wt Readings from Last 3 Encounters:    06/04/17 147 lb (66.7 kg)  05/23/17 148 lb (67.1 kg)  05/14/17 148 lb (67.1 kg)   PAD Screen 06/07/2017  Previous PAD dx? Yes  Previous surgical procedure? Yes  Dates of procedures May 2018. See PMH  Pain with walking? No  Feet/toe relief with dangling? No  Painful, non-healing ulcers? Yes  Extremities discolored? Yes    PHYSICAL EXAM BP 104/74   Pulse 80   Ht 5\' 5"  (1.651 m)   Wt 147 lb (66.7 kg)   BMI 24.46 kg/m  General appearance: alert, cooperative, appears Older than stated age, no distress. Well-nourished, well-groomed. HEENT: [place]/AT, EOMI, MMM, anicteric sclera Neck: no adenopathy, no obvious carotid  bruit and no JVD Lungs: clear to auscultation bilaterally, normal percussion bilaterally and non-labored Heart: regular rate and rhythm, S1 &S2 normal, 1/2 SEM at LUSB. Otherwise no murmur, click, rub or gallop; nondisplaced to my Abdomen: soft, non-tender; bowel sounds normal; no masses,  no organomegaly; no HJR Extremities: extremities normal, atraumatic, no cyanosis, or edema  Pulses: 2+ and symmetric; notable brisk pulse on right PT pulse Skin: mobility and turgor normal with no lesions. Neurologic: Mental status: Alert & oriented x 3, thought content appropriate; non-focal exam.  Pleasant mood & affect.   Adult ECG Report None  Other studies Reviewed: Additional studies/ records that were reviewed today include:  Recent Labs:  [facility] Cholesterol despite High dose Livalo Lab Results  Component Value Date   CHOL 251 (H) 05/15/2017   HDL 47 05/15/2017   LDLCALC 170 (H) 05/15/2017   LDLDIRECT 186 (H) 03/30/2015   TRIG 168 (H) 05/15/2017   CHOLHDL 5.3 05/15/2017   Lab Results  Component Value Date   CREATININE 0.70 05/15/2017   BUN 7 05/15/2017   NA 139 05/15/2017   K 3.3 (L) 05/15/2017   CL 103 05/15/2017   CO2 29 05/15/2017    ASSESSMENT / PLAN: Problem List Items Addressed This Visit    Cerebrovascular disease (Chronic)    Multiple TIAs. She is due for  carotid Doppler evaluation which we will go ahead and order. No further TIA symptoms. Continue aggressive cardiovascular risk reduction      Relevant Orders   CT CARDIAC SCORING   VAS US CAROTID   Critical lower limb ischemia (Chronic)   Relevant Orders   CT CARDIAC SCORING   Essential hypertension (Chronic)    Very well controlled on current dose of amlodipine and lisinopril with HCTZ. Would not further titrate.      Relevant Orders   CT CARDIAC SCORING   Hyperlipidemia with target LDL less than 70 - Primary (Chronic)    Despite being on Livalo and fenofibrate, her labs are just not well enough controlled.  I will refer to [facility] ([facility] - run by our clinical pharmacist to discuss potential options for more aggressive cholesterol control.      Relevant Orders   CT CARDIAC SCORING   PAD (peripheral artery disease) (HCC) (Chronic)    Doing well postop. Continue risk factor modification: On aspirin and ACE inhibitor along with cast channel blocker. She is on statin plus fenofibrate.  With significant PAD now documented and her TIAs, I would like to get a baseline evaluation for potential CAD. Plan: Check coronary calcium score as baseline evaluation.      Relevant Orders   CT CARDIAC SCORING   VAS US CAROTID   Tobacco abuse (Chronic)    She finally has been abstinent of cigarettes for over a month now. Doing well. I congratulated her on her efforts. She'll for she has not required any "crutches "      Relevant Orders   CT CARDIAC SCORING      Current medicines are reviewed at length with the patient today. (+/- concerns) Starting has an aching Livalo The following changes have been made: See below  Patient Instructions  SCHEDULE AT [facility] has requested that you have cardiac CT SCORING. Cardiac computed tomography (CT) is a painless test that uses an x-ray machine to take clear,  detailed pictures of your heart. For further information please visit [email or web address]. Please follow instruction sheet as given.  SCHEDULE AT [facility] Your physician has requested that you have a carotid duplex. This test is an ultrasound of the carotid arteries in your neck. It looks at blood flow through these arteries that supply the brain with blood. Allow one hour for this exam. There are no restrictions or special instructions.     Your physician recommends that you schedule a follow-up appointment with [facility] -- DISCUSS CHOLESTEROL.   Your physician recommends that you schedule a follow-up appointment in [place].   Studies Ordered:   Orders Placed This Encounter  Procedures  . CT CARDIAC SCORING      Glenetta Hew, M.D., M.S. Interventional Cardiologist   Pager # [phone] Phone # [phone] [address] [place], [place] [address]

## 2017-06-04 NOTE — Patient Instructions (Signed)
SCHEDULE AT [place] [address] Your physician has requested that you have cardiac CT SCORING. Cardiac computed tomography (CT) is a painless test that uses an x-ray machine to take clear, detailed pictures of your heart. For further information please visit [email or web address]. Please follow instruction sheet as given.     SCHEDULE AT [place] Your physician has requested that you have a carotid duplex. This test is an ultrasound of the carotid arteries in your neck. It looks at blood flow through these arteries that supply the brain with blood. Allow one hour for this exam. There are no restrictions or special instructions.     Your physician recommends that you schedule a follow-up appointment with [facility] -- DISCUSS CHOLESTEROL.   Your physician recommends that you schedule a follow-up appointment in [place].

## 2017-06-07 ENCOUNTER — Encounter: Payer: Self-pay | Admitting: Cardiology

## 2017-06-07 ENCOUNTER — Ambulatory Visit (INDEPENDENT_AMBULATORY_CARE_PROVIDER_SITE_OTHER): Payer: Medicare Other | Admitting: Pharmacist

## 2017-06-07 ENCOUNTER — Encounter: Payer: Self-pay | Admitting: Pharmacist

## 2017-06-07 DIAGNOSIS — E785 Hyperlipidemia, unspecified: Secondary | ICD-10-CM | POA: Diagnosis not present

## 2017-06-07 MED ORDER — EVOLOCUMAB 140 MG/ML ~~LOC~~ SOAJ: 140.0000 mg | SUBCUTANEOUS | 11 refills | Status: DC

## 2017-06-07 NOTE — Patient Instructions (Addendum)
We start paperwork for Repatha (injection therapy).   Decrease Livalo dose to 2mg  (you may take 1/2 tablet of your current supply) daily due to muscle aching. Call clinic [phone] if you do not notice an improvement in muscle symptoms in 4-6 weeks.    Cholesterol Cholesterol is a white, waxy, fat-like substance that is needed by the human body in small amounts. The liver makes all the cholesterol we need. Cholesterol is carried from the liver by the blood through the blood vessels. Deposits of cholesterol (plaques) may build up on blood vessel (artery) walls. Plaques make the arteries narrower and stiffer. Cholesterol plaques increase the risk for heart attack and stroke. You cannot feel your cholesterol level even if it is very high. The only way to know that it is high is to have a blood test. Once you know your cholesterol levels, you should keep a record of the test results. Work with your health care provider to keep your levels in the desired range. What do the results mean?  Total cholesterol is a rough measure of all the cholesterol in your blood.  LDL (low-density lipoprotein) is the "bad" cholesterol. This is the type that causes plaque to build up on the artery walls. You want this level to be low.  HDL (high-density lipoprotein) is the "good" cholesterol because it cleans the arteries and carries the LDL away. You want this level to be high.  Triglycerides are fat that the body can either burn for energy or store. High levels are closely linked to heart disease. What are the desired levels of cholesterol?  Total cholesterol below 200.  LDL below 100 for people who are at risk, below 70 for people at very high risk.  HDL above 40 is good. A level of 60 or higher is considered to be protective against heart disease.  Triglycerides below 150. How can I lower my cholesterol? Diet Follow your diet program as told by your health care provider.  Choose fish or white meat  chicken and [place], roasted or baked. Limit fatty cuts of red meat, fried foods, and processed meats, such as sausage and lunch meats.  Eat lots of fresh fruits and vegetables.  Choose whole grains, beans, pasta, potatoes, and cereals.  Choose olive oil, corn oil, or canola oil, and use only small amounts.  Avoid butter, mayonnaise, shortening, or palm kernel oils.  Avoid foods with trans fats.  Drink skim or nonfat milk and eat low-fat or nonfat yogurt and cheeses. Avoid whole milk, cream, ice cream, egg yolks, and full-fat cheeses.  Healthier desserts include angel food cake, ginger snaps, animal crackers, hard candy, popsicles, and low-fat or nonfat frozen yogurt. Avoid pastries, cakes, pies, and cookies.  Exercise  Follow your exercise program as told by your health care provider. A regular program: ? Helps to decrease LDL and raise HDL. ? Helps with weight control.  Do things that increase your activity level, such as gardening, walking, and taking the stairs.  Ask your health care provider about ways that you can be more active in your daily life.  Medicine  Take over-the-counter and prescription medicines only as told by your health care provider. ? Medicine may be prescribed by your health care provider to help lower cholesterol and decrease the risk for heart disease. This is usually done if diet and exercise have failed to bring down cholesterol levels. ? If you have several risk factors, you may need medicine even if your levels are normal.  This  information is not intended to replace advice given to you by your health care provider. Make sure you discuss any questions you have with your health care provider. Document Released: 09/05/2001 Document Revised: 07/08/2016 Document Reviewed: 06/10/2016 Elsevier Interactive Patient Education  2017 [employer].

## 2017-06-07 NOTE — Assessment & Plan Note (Signed)
Doing well postop. Continue risk factor modification: On aspirin and ACE inhibitor along with cast channel blocker. She is on statin plus fenofibrate.  With significant PAD now documented and her TIAs, I would like to get a baseline evaluation for potential CAD. Plan: Check coronary calcium score as baseline evaluation.

## 2017-06-07 NOTE — Assessment & Plan Note (Signed)
Multiple TIAs. She is due for carotid Doppler evaluation which we will go ahead and order. No further TIA symptoms. Continue aggressive cardiovascular risk reduction

## 2017-06-07 NOTE — Assessment & Plan Note (Signed)
She finally has been abstinent of cigarettes for over a month now. Doing well. I congratulated her on her efforts. She'll for she has not required any "crutches "

## 2017-06-07 NOTE — Assessment & Plan Note (Signed)
Very well controlled on current dose of amlodipine and lisinopril with HCTZ. Would not further titrate.

## 2017-06-07 NOTE — Assessment & Plan Note (Signed)
Despite being on Livalo and fenofibrate, her labs are just not well enough controlled.  I will refer to [facility] ([facility]) - run by our clinical pharmacist to discuss potential options for more aggressive cholesterol control.

## 2017-06-07 NOTE — Progress Notes (Signed)
Patient ID: Brenda Carney                 DOB: 25-Sep-1951                    MRN: [id number]     HPI: Brenda Carney is a 66 y.o. female patient of Dr. Ellyn Hack with [place] [facility] that presents today for lipid evaluation. She has a history of multiple TIAs and PAD. Based on this her LDL goal is less than 70. She is also being evaluated for CAD and has a calcium score scheduled for next week.   She has been taking Livalo 4mg  daily and fenofibrate 145mg  daily for quite sometime now. She reports she has had increasing muscle and joint discomfort with Livalo 4mg . She states she does not recall any issues with Livalo 1mg  daily. She states the muscle aches and joint discomfort has become intolerable over the last several weeks.   Risk Factors: PAD, TIA, HTN LDL Goal: <70  Current Medications: Pitavastatin 4mg  daily - she currently is having pain with this dose, Fenofibrate 145mg  daily Intolerances: Lipitor, Crestor, Zocor, Zetia - she reports full body pain including in her joints (mainly hips),  - pain resolved when she stopped medications   Diet: Most meals from home and she never fries foods. She is good about vegetables and rarely eats a steak. She has been on low cholesterol diet.   Exercise: No formal exercise. Normally very active but recently has been less active due to losing her husband last year.   Family History: Father had had severe PAD eventually leading to amputation; mother had a MI at 72  Social History: Quit smoking 1 month ago. 2 glasses of wine per week.   Labs: 05/15/17: TC 251  TG 168  HDL 47  LDL 170  nonHDL 204 - Pitavastatin 4mg  daily and fenofibrate 145mg  daily   Past Medical History:  Diagnosis Date  . Arthritis    "thumbs; hips" (05/14/2017)  . 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 Cerebrovascular disease    Carotid dopplers which showed no significant increase in velocities, extremely minimal on the  left, antegrade vertebral bilaterally.  . Cervical spondylosis   . Chronic lower back pain   . COPD (chronic obstructive pulmonary disease) ([place])    "little bit" (05/14/2017)  . Exercise-induced asthma with acute exacerbation    "only in very hot weather" (05/14/2017)  . GERD (gastroesophageal reflux disease)   . UMPNTIRW(431.5)    "monthly" (05/14/2017)  . History of IBS    resovlved  . History of kidney stones   . 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 Hyperlipidemia with target LDL less than 70    Mostly statin intolerant,. Currently on Livalo 4 mg  . Hypertension, benign   . Lumbosacral spondylosis   . Migraine headache    "were monthly; none recently" (05/14/2017)  . PAD (peripheral artery disease) ([facility])    Status post right above-the-knee-below the knee popliteal artery bypass; postop right ABI 0.96.  Marland Kitchen Pneumonia "several times"  . Sleep apnea    does not wear CPAP; "think it was medication related" (05/14/2017)  . TIA (transient ischemic attack) 09/2011   "several"    Current Outpatient Prescriptions on File Prior to Visit  Medication Sig Dispense Refill  . amLODipine (NORVASC) 5 MG tablet TAKE 1 TABLET ONCE DAILY. 30 tablet 7  .  aspirin EC 81 MG tablet Take 81 mg by mouth 2 (two) times daily.    . ciprofloxacin (CIPRO) 500 MG tablet Take 1 tablet (500 mg total) by mouth 2 (two) times daily. 20 tablet 0  . desvenlafaxine (PRISTIQ) 100 MG 24 hr tablet Take 100 mg by mouth daily.    . fenofibrate (TRICOR) 145 MG tablet Take 145 mg by mouth daily.   1  . Ipratropium-Albuterol (COMBIVENT RESPIMAT) 20-100 MCG/ACT AERS respimat Inhale 2 puffs into the lungs every 6 (six) hours as needed for wheezing.    Marland Kitchen KRILL OIL PO Take 1 capsule by mouth daily.    Marland Kitchen lisinopril-hydrochlorothiazide (PRINZIDE,ZESTORETIC) 20-12.5 MG per tablet Take 2 tablets by mouth daily.   0  . mometasone (NASONEX) 50 MCG/ACT nasal spray Place 2 sprays into the nose daily as needed  (allergies).    . Multiple Vitamin (MULTIVITAMIN WITH MINERALS) TABS tablet Take 1 tablet by mouth daily.    . mupirocin ointment (BACTROBAN) 2 % Apply 1 application topically as needed (infections).   98  . OVER THE COUNTER MEDICATION Take 4 capsules by mouth daily as needed (energy). SeroVital otc supplement    . Pitavastatin Calcium 4 MG TABS Take 1 tablet (4 mg total) by mouth daily. 30 tablet 5  . potassium chloride (K-DUR,KLOR-CON) 10 MEQ tablet Take 10 mEq by mouth daily.    . traMADol (ULTRAM) 50 MG tablet Take 100 mg by mouth 3 (three) times daily as needed for moderate pain.     Marland Kitchen zolpidem (AMBIEN) 10 MG tablet Take 15-20 mg by mouth at bedtime.      No current facility-administered medications on file prior to visit.     Allergies  Allergen Reactions  . Lyrica [Pregabalin] Other (See Comments)    DRY MOUTH  . Penicillins Swelling    Childhood reaction- tongue swelled Has patient had a PCN reaction causing immediate rash, facial/tongue/throat swelling, SOB or lightheadedness with hypotension: Yes Has patient had a PCN reaction causing severe rash involving mucus membranes or skin necrosis: No Has patient had a PCN reaction that required hospitalization: No Has patient had a PCN reaction occurring within the last 10 years: No If all of the above answers are "NO", then may proceed with Cephalosporin use.     Assessment/Plan: Hyperlipidemia: LDL not at goal. Due to muscle and joint aches will decrease to Livalo 2mg  daily to see if she is able to tolerate this dose. Discussed at length benefits of being on some statin. She is willing to try lower dose. Will pursue Repatha as LDL is not at goal (LDL likely will increase with decreased dose of Livalo).    Thank you,  Brenda Carney, [facility]  06/07/2017 10:06 AM

## 2017-06-08 ENCOUNTER — Ambulatory Visit (INDEPENDENT_AMBULATORY_CARE_PROVIDER_SITE_OTHER): Payer: Self-pay | Admitting: Vascular Surgery

## 2017-06-08 ENCOUNTER — Encounter: Payer: Self-pay | Admitting: Vascular Surgery

## 2017-06-08 VITALS — BP 115/79 | HR 114 | Temp 98.2°F | Resp 16 | Ht 65.0 in | Wt 148.0 lb

## 2017-06-08 DIAGNOSIS — I70229 Atherosclerosis of native arteries of extremities with rest pain, unspecified extremity: Secondary | ICD-10-CM

## 2017-06-08 DIAGNOSIS — I998 Other disorder of circulatory system: Secondary | ICD-10-CM

## 2017-06-12 ENCOUNTER — Ambulatory Visit (INDEPENDENT_AMBULATORY_CARE_PROVIDER_SITE_OTHER)
Admission: RE | Admit: 2017-06-12 | Discharge: 2017-06-12 | Disposition: A | Discharge status: Home / Self Care | Payer: Self-pay | Source: Ambulatory Visit | Attending: Cardiology | Admitting: Cardiology

## 2017-06-12 DIAGNOSIS — I700 Atherosclerosis of aorta: Secondary | ICD-10-CM | POA: Diagnosis not present

## 2017-06-12 DIAGNOSIS — I70229 Atherosclerosis of native arteries of extremities with rest pain, unspecified extremity: Secondary | ICD-10-CM

## 2017-06-12 DIAGNOSIS — I10 Essential (primary) hypertension: Secondary | ICD-10-CM

## 2017-06-12 DIAGNOSIS — Z720 Tobacco use: Secondary | ICD-10-CM

## 2017-06-12 DIAGNOSIS — I739 Peripheral vascular disease, unspecified: Secondary | ICD-10-CM

## 2017-06-12 DIAGNOSIS — E785 Hyperlipidemia, unspecified: Secondary | ICD-10-CM

## 2017-06-12 DIAGNOSIS — I998 Other disorder of circulatory system: Secondary | ICD-10-CM

## 2017-06-12 DIAGNOSIS — I679 Cerebrovascular disease, unspecified: Secondary | ICD-10-CM

## 2017-06-13 ENCOUNTER — Other Ambulatory Visit: Payer: Self-pay

## 2017-06-15 NOTE — Progress Notes (Signed)
Coronary calcium score was highly positive. With this result, I think we should probably consider going forward with a nuclear stress test (if unable to walk, we can do this with a pharmacologic study) --> not surprisingly there is evidence of significant coronary disease. There is also evidence of a dilated thoracic aorta which we can follow-up with a dedicated CT angiogram was we have evaluated the heart.  Glenetta Hew, MD  Pls forward to PCP: Dr. Leeroy Cha

## 2017-06-18 ENCOUNTER — Telehealth: Payer: Self-pay | Admitting: Cardiology

## 2017-06-18 ENCOUNTER — Ambulatory Visit (HOSPITAL_COMMUNITY): Payer: Medicare Other | Admitting: Emergency Medicine

## 2017-06-18 ENCOUNTER — Telehealth: Payer: Self-pay

## 2017-06-18 ENCOUNTER — Ambulatory Visit (HOSPITAL_COMMUNITY)
Admission: RE | Admit: 2017-06-18 | Discharge: 2017-06-18 | Disposition: A | Discharge status: Home / Self Care | Payer: Medicare Other | Source: Ambulatory Visit | Attending: Cardiology | Admitting: Cardiology

## 2017-06-18 ENCOUNTER — Encounter (HOSPITAL_COMMUNITY): Payer: Self-pay | Admitting: *Deleted

## 2017-06-18 DIAGNOSIS — I6523 Occlusion and stenosis of bilateral carotid arteries: Secondary | ICD-10-CM | POA: Diagnosis not present

## 2017-06-18 DIAGNOSIS — I679 Cerebrovascular disease, unspecified: Secondary | ICD-10-CM

## 2017-06-18 DIAGNOSIS — I739 Peripheral vascular disease, unspecified: Secondary | ICD-10-CM

## 2017-06-18 NOTE — Progress Notes (Signed)
Anesthesia Chart Review:  Pt is a same day work up.   Pt is a 66 year old female scheduled for incision and drainage R calf seroma on 06/19/2017 with Adele Barthel, MD  - PCP is Leeroy Cha, MD  - Cardiologist is Glenetta Hew, MD, last office visit 06/07/17. Dr. Ellyn Hack comments on Cardiac CT results that pt needs a stress test. Telephone encounter 06/18/17 by Michaele Offer, RN notes pt can proceed with I&D of seroma surgery prior to getting stress test.   PMH includes:  HTN, TIA, hyperlipidemia, cerebral aneurysm, COPD, exercise-induced asthma, OSA, PAD (s/p R above knee popliteal - below knee popliteal BG 05/15/17), GERD. Former smoker. BMI 25. S/p maximum access PLIF 09/30/14.    Medications include: amlodipine, abilify, ASA 81mg , repatha, fenofibrate, lisinopril-hctz, pitavastatin, potassium.   Labs will be obtained DOS.   EKG 05/14/17: NSR. Possible LA enlargement. Low voltage QRS. Septal infarct, age undetermined  CT cardiac scoring 06/12/17:  1. No acute extracardiac CT findings in the imaged chest. 2.  Aortic Atherosclerosis (ICD10-I70.0). 3.  Emphysema (ICD10-J43.9).  Echo 09/06/13:  - Procedure narrative: Transthoracic echocardiography. Technically difficult study with suboptimal image quality. - Left ventricle: The cavity size was normal. Wall thicknesswas normal. Systolic function was normal. The estimatedejection fraction was in the range of 55% to 60%. Dopplerparameters are consistent with abnormal left ventricularrelaxation (grade 1 diastolic dysfunction). The E/e' ratiois ~10, suggesting borderline increased LV fillingpressure. - Mitral valve: Calcified annulus. Mildly thickened leaflets. Trivial regurgitation. - Left atrium: The atrium was mildly dilated. Volume 32 ml/m2. - Atrial septum: Appears thickened. No defect or patent foramen ovale was identified by color doppler. - Inferior vena cava: The vessel was normal in size; the respirophasic diameter changes  were in the normal range (=50%); findings are consistent with normal central venouspressure.  If labs acceptable DOS, I anticipate pt can proceed as scheduled.   Willeen Cass, FNP-BC [facility]/Anesthesiology Phone: [phone] 06/18/2017 12:35 PM

## 2017-06-18 NOTE — Telephone Encounter (Signed)
Forward to [facility]  TO MANAGE

## 2017-06-18 NOTE — Progress Notes (Signed)
Spoke with pt for pre-op call. Pt denies cardiac history, chest pain or sob. Pt denies being diabetic. 

## 2017-06-18 NOTE — Telephone Encounter (Signed)
Phone call to Dr. Allison Quarry nurse re: plan for I & D of right calf seroma on 6/26, and of plan for Dr. Ellyn Hack to schedule a Nuclear Stress Test.  Questioned if Dr. Ellyn Hack feels that pt. can proceed with the above surgery, prior to stress test being done?  Per Ivin Booty, after speaking with Dr. Ellyn Hack, advised that he feels it is okay to proceed with the I & D of right calf seroma tomorrow.

## 2017-06-18 NOTE — Telephone Encounter (Signed)
New message  Brenda Carney from [facility] called requesting to speak with RN. Brenda Carney states the medication Repatha was approved for a year and a letter will be sent over to the office. She states pt was notified. Please call back to discuss if needed.

## 2017-06-19 ENCOUNTER — Encounter (HOSPITAL_COMMUNITY): Payer: Self-pay | Admitting: *Deleted

## 2017-06-19 ENCOUNTER — Ambulatory Visit (HOSPITAL_COMMUNITY)
Admission: RE | Admit: 2017-06-19 | Discharge: 2017-06-19 | Disposition: A | Discharge status: Home / Self Care | Payer: Medicare Other | Source: Ambulatory Visit | Attending: Vascular Surgery | Admitting: Vascular Surgery

## 2017-06-19 ENCOUNTER — Encounter (HOSPITAL_COMMUNITY)
Admission: RE | Disposition: A | Discharge status: Home / Self Care | Payer: Self-pay | Source: Ambulatory Visit | Attending: Vascular Surgery

## 2017-06-19 DIAGNOSIS — I340 Nonrheumatic mitral (valve) insufficiency: Secondary | ICD-10-CM | POA: Diagnosis not present

## 2017-06-19 DIAGNOSIS — Q211 Atrial septal defect: Secondary | ICD-10-CM | POA: Insufficient documentation

## 2017-06-19 DIAGNOSIS — G4730 Sleep apnea, unspecified: Secondary | ICD-10-CM | POA: Insufficient documentation

## 2017-06-19 DIAGNOSIS — Z8673 Personal history of transient ischemic attack (TIA), and cerebral infarction without residual deficits: Secondary | ICD-10-CM | POA: Insufficient documentation

## 2017-06-19 DIAGNOSIS — Z79899 Other long term (current) drug therapy: Secondary | ICD-10-CM | POA: Diagnosis not present

## 2017-06-19 DIAGNOSIS — I739 Peripheral vascular disease, unspecified: Secondary | ICD-10-CM | POA: Diagnosis not present

## 2017-06-19 DIAGNOSIS — J439 Emphysema, unspecified: Secondary | ICD-10-CM | POA: Diagnosis not present

## 2017-06-19 DIAGNOSIS — Z7982 Long term (current) use of aspirin: Secondary | ICD-10-CM | POA: Diagnosis not present

## 2017-06-19 DIAGNOSIS — L7634 Postprocedural seroma of skin and subcutaneous tissue following other procedure: Secondary | ICD-10-CM | POA: Insufficient documentation

## 2017-06-19 DIAGNOSIS — I700 Atherosclerosis of aorta: Secondary | ICD-10-CM | POA: Diagnosis not present

## 2017-06-19 DIAGNOSIS — E785 Hyperlipidemia, unspecified: Secondary | ICD-10-CM | POA: Diagnosis not present

## 2017-06-19 DIAGNOSIS — Z539 Procedure and treatment not carried out, unspecified reason: Secondary | ICD-10-CM | POA: Diagnosis not present

## 2017-06-19 DIAGNOSIS — I10 Essential (primary) hypertension: Secondary | ICD-10-CM | POA: Insufficient documentation

## 2017-06-19 DIAGNOSIS — Z87891 Personal history of nicotine dependence: Secondary | ICD-10-CM | POA: Insufficient documentation

## 2017-06-19 DIAGNOSIS — M7989 Other specified soft tissue disorders: Secondary | ICD-10-CM | POA: Diagnosis present

## 2017-06-19 DIAGNOSIS — Z9862 Peripheral vascular angioplasty status: Secondary | ICD-10-CM | POA: Diagnosis not present

## 2017-06-19 HISTORY — DX: Anemia, unspecified: D64.9

## 2017-06-19 HISTORY — DX: Depression, unspecified: F32.A

## 2017-06-19 HISTORY — DX: Major depressive disorder, single episode, unspecified: F32.9

## 2017-06-19 LAB — BASIC METABOLIC PANEL
Anion gap: 13 (ref 5–15)
BUN: 12 mg/dL (ref 6–20)
CO2: 21 mmol/L — ABNORMAL LOW (ref 22–32)
Calcium: 9.3 mg/dL (ref 8.9–10.3)
Chloride: 104 mmol/L (ref 101–111)
Creatinine, Ser: 0.8 mg/dL (ref 0.44–1.00)
GFR calc Af Amer: >60 mL/min (ref >60)
GFR calc non Af Amer: >60 mL/min (ref >60)
Glucose, Bld: 100 mg/dL — ABNORMAL HIGH (ref 65–99)
Potassium: 3 mmol/L — ABNORMAL LOW (ref 3.5–5.1)
Sodium: 138 mmol/L (ref 135–145)

## 2017-06-19 LAB — CBC
HCT: 43.2 % (ref 36.0–46.0)
Hemoglobin: 14.3 g/dL (ref 12.0–15.0)
MCH: 32.1 pg (ref 26.0–34.0)
MCHC: 33.1 g/dL (ref 30.0–36.0)
MCV: 97.1 fL (ref 78.0–100.0)
Platelets: 438 10*3/uL — ABNORMAL HIGH (ref 150–400)
RBC: 4.45 MIL/uL (ref 3.87–5.11)
RDW: 13.1 % (ref 11.5–15.5)
WBC: 10.2 10*3/uL (ref 4.0–10.5)

## 2017-06-19 SURGERY — IRRIGATION AND DEBRIDEMENT EXTREMITY
Anesthesia: General | Site: Leg Lower | Laterality: Right

## 2017-06-19 MED ORDER — VANCOMYCIN HCL IN DEXTROSE 1-5 GM/200ML-% IV SOLN
1000.0000 mg | INTRAVENOUS | Status: DC
  Filled 2017-06-19: qty 200

## 2017-06-19 MED ORDER — PROPOFOL 10 MG/ML IV BOLUS
INTRAVENOUS | Status: AC
  Filled 2017-06-19: qty 20

## 2017-06-19 MED ORDER — CHLORHEXIDINE GLUCONATE CLOTH 2 % EX PADS: 6.0000 | MEDICATED_PAD | Freq: Once | CUTANEOUS | Status: DC

## 2017-06-19 MED ORDER — MIDAZOLAM HCL 2 MG/2ML IJ SOLN
INTRAMUSCULAR | Status: AC
  Filled 2017-06-19: qty 2

## 2017-06-19 MED ORDER — FENTANYL CITRATE (PF) 250 MCG/5ML IJ SOLN
INTRAMUSCULAR | Status: AC
  Filled 2017-06-19: qty 5

## 2017-06-19 MED ORDER — LIDOCAINE 2% (20 MG/ML) 5 ML SYRINGE
INTRAMUSCULAR | Status: AC
  Filled 2017-06-19: qty 5

## 2017-06-19 MED ORDER — SODIUM CHLORIDE 0.9 % IV SOLN: INTRAVENOUS | Status: DC

## 2017-06-19 NOTE — H&P (View-Only) (Signed)
Postoperative Visit   History of Present Illness   Brenda Carney is a 66 y.o. year old female who presents for postoperative follow-up for: R AK to BK pop BPG w/ ips NR w/ GSV, linton patch (05/15/17).  The patient's wounds are healed.  The patient notes resolution of lower extremity symptoms.  The patient is able to complete their activities of daily living.  The patient's current symptoms are: swelling in R calf incision.  Pt notes some residual discomforted in R great toe.  Past Medical History:  Diagnosis Date  . Arthritis    "thumbs; hips" (05/14/2017)  . 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 Cerebrovascular disease    Carotid dopplers which showed no significant increase in velocities, extremely minimal on the left, antegrade vertebral bilaterally.  . Cervical spondylosis   . Chronic lower back pain   . COPD (chronic obstructive pulmonary disease) ([place])    "little bit" (05/14/2017)  . Exercise-induced asthma with acute exacerbation    "only in very hot weather" (05/14/2017)  . GERD (gastroesophageal reflux disease)   . CBJSEGBT(517.6)    "monthly" (05/14/2017)  . History of IBS    resovlved  . History of kidney stones   . 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 Hyperlipidemia with target LDL less than 70    Mostly statin intolerant,. Currently on Livalo 4 mg  . Hypertension, benign   . Lumbosacral spondylosis   . Migraine headache    "were monthly; none recently" (05/14/2017)  . PAD (peripheral artery disease) ([facility])    Status post right above-the-knee-below the knee popliteal artery bypass; postop right ABI 0.96.  Marland Kitchen Pneumonia "several times"  . Sleep apnea    does not wear CPAP; "think it was medication related" (05/14/2017)  . TIA (transient ischemic attack) 09/2011   "several"    Past Surgical History:  Procedure Laterality Date  . ABDOMINAL AORTOGRAM W/LOWER EXTREMITY N/A  05/14/2017   Procedure: Abdominal Aortogram w/Lower Extremity;  Surgeon: Conrad Moran, MD;  Location: [facility];  Service: Cardiovascular;  Laterality: N/A;  . ACROMIO-CLAVICULAR JOINT REPAIR Left 1990s   "I think it was called an AC joint removal"  . BACK SURGERY    . BYPASS GRAFT POPLITEAL TO POPLITEAL Right 05/15/2017   Procedure: BYPASS GRAFT ABOVE KNEE POPLITEAL TO BELOW KNEE POPLITEAL ARTERY;  Surgeon: Conrad [place], MD;  Location: [facility];  Service: Vascular;  Laterality: Right;  . FOOT FRACTURE SURGERY Bilateral    multiple procedures  . FRACTURE SURGERY    . MAXIMUM ACCESS (MAS)POSTERIOR LUMBAR INTERBODY FUSION (PLIF) 1 LEVEL N/A 09/30/2014   Procedure: FOR MAXIMUM ACCESS (MAS) POSTERIOR LUMBAR INTERBODY FUSION (PLIF) 1 LEVEL;  Surgeon: Eustace Moore, MD;  Location: [facility];  Service: Neurosurgery;  Laterality: N/A;  FOR MAXIMUM ACCESS (MAS) POSTERIOR LUMBAR INTERBODY FUSION (PLIF) 1 LEVEL LUMBAR 4-5  . MULTIPLE TOOTH EXTRACTIONS Right 04/2017  . NM MYOVIEW LTD  03/2008   LOW RISK. No ischemia or infarction.There is prominent gut uptake activity noted in the infero-apical region.  . TRANSTHORACIC ECHOCARDIOGRAM  09/07/2013   Done to evaluate TIA: Normal LV size and function. EF 55-60%. GR 1 DD. Mild left atrial dilation. Mildly calcified mitral leaflets. Otherwise normal.    Social History   Social History  . Marital status: Widowed    Spouse name: [name]   . Number  of children: 0  . Years of education: COLLEGE   Occupational History  . Horse Breeder     Social History Main Topics  . Smoking status: Former Smoker    Packs/day: 0.50    Years: 30.00    Types: Cigarettes    Quit date: 04/24/2017  . Smokeless tobacco: Never Used  . Alcohol use 1.8 oz/week    3 Glasses of wine per week  . Drug use: No  . Sexual activity: No   Other Topics Concern  . Not on file   Social History Narrative   Patient lives at home with her husband [name]. Patient has no  children.    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    Patient has BA degree.    Smokes 2-3 cigarettes /day -- former heavy smoker (failed "quitting") -- now says she has truly "QUIT"    Family History  Problem Relation Age of Onset  . Stroke Father   . Heart attack Mother   . Stroke Mother     Current Outpatient Prescriptions  Medication Sig Dispense Refill  . amLODipine (NORVASC) 5 MG tablet TAKE 1 TABLET ONCE DAILY. 30 tablet 7  . aspirin EC 81 MG tablet Take 81 mg by mouth 2 (two) times daily.    . ciprofloxacin (CIPRO) 500 MG tablet Take 1 tablet (500 mg total) by mouth 2 (two) times daily. 20 tablet 0  . desvenlafaxine (PRISTIQ) 100 MG 24 hr tablet Take 100 mg by mouth daily.    . Evolocumab (REPATHA SURECLICK) 591 MG/ML SOAJ Inject 140 mg into the skin every 14 (fourteen) days. 2 pen 11  . fenofibrate (TRICOR) 145 MG tablet Take 145 mg by mouth daily.   1  . Ipratropium-Albuterol (COMBIVENT RESPIMAT) 20-100 MCG/ACT AERS respimat Inhale 2 puffs into the lungs every 6 (six) hours as needed for wheezing.    Marland Kitchen KRILL OIL PO Take 1 capsule by mouth daily.    Marland Kitchen lisinopril-hydrochlorothiazide (PRINZIDE,ZESTORETIC) 20-12.5 MG per tablet Take 2 tablets by mouth daily.   0  . mometasone (NASONEX) 50 MCG/ACT nasal spray Place 2 sprays into the nose daily as needed (allergies).    . Multiple Vitamin (MULTIVITAMIN WITH MINERALS) TABS tablet Take 1 tablet by mouth daily.    . mupirocin ointment (BACTROBAN) 2 % Apply 1 application topically as needed (infections).   98  . OVER THE COUNTER MEDICATION Take 4 capsules by mouth daily as needed (energy). SeroVital otc supplement    . Pitavastatin Calcium 4 MG TABS Take 0.5 tablets (2 mg total) by mouth daily. 30 tablet 5  . potassium chloride (K-DUR,KLOR-CON) 10 MEQ tablet Take 10 mEq by mouth daily.    . traMADol (ULTRAM) 50 MG tablet Take 100 mg by mouth 3 (three) times daily as needed for moderate pain.     Marland Kitchen zolpidem (AMBIEN)  10 MG tablet Take 15-20 mg by mouth at bedtime.      No current facility-administered medications for this visit.      Allergies  Allergen Reactions  . Lyrica [Pregabalin] Other (See Comments)    DRY MOUTH  . Penicillins Swelling    Childhood reaction- tongue swelled Has patient had a PCN reaction causing immediate rash, facial/tongue/throat swelling, SOB or lightheadedness with hypotension: Yes Has patient had a PCN reaction causing severe rash involving mucus membranes or skin necrosis: No Has patient had a PCN reaction that required hospitalization: No Has patient had a PCN reaction occurring within  the last 10 years: No If all of the above answers are "NO", then may proceed with Cephalosporin use.     REVIEW OF SYSTEMS (negative unless checked):   Cardiac:  []  Chest pain or chest pressure? []  Shortness of breath upon activity? []  Shortness of breath when lying flat? []  Irregular heart rhythm?  Vascular:  []  Pain in calf, thigh, or hip brought on by walking? []  Pain in feet at night that wakes you up from your sleep? []  Blood clot in your veins? [x]  Leg swelling?  Pulmonary:  []  Oxygen at home? []  Productive cough? []  Wheezing?  Neurologic:  []  Sudden weakness in arms or legs? []  Sudden numbness in arms or legs? []  Sudden onset of difficult speaking or slurred speech? []  Temporary loss of vision in one eye? []  Problems with dizziness?  Gastrointestinal:  []  Blood in stool? []  Vomited blood?  Genitourinary:  []  Burning when urinating? []  Blood in urine?  Psychiatric:  []  Major depression  Hematologic:  []  Bleeding problems? []  Problems with blood clotting?  Dermatologic:  []  Rashes or ulcers?  Constitutional:  []  Fever or chills?  Ear/Nose/Throat:  []  Change in hearing? []  Nose bleeds? []  Sore throat?  Musculoskeletal:  []  Back pain? []  Joint pain? []  Muscle pain?  For VQI Use Only   PRE-ADM LIVING: Home  AMB STATUS:  Ambulatory   Physical Examination   Vitals:   06/08/17 1228  BP: 115/79  Pulse: (!) 114  Resp: 16  Temp: 98.2 F (36.8 C)  SpO2: 98%  Weight: 148 lb (67.1 kg)  Height: 5\' 5"  (1.651 m)   Pulmonary Sym exp, good air movt, CTAB, no rales, rhonchi, & wheezing  Cardiac RRR, Nl S1, S2, no Murmurs, rubs or gallops   RLE: Incisions are healed, ballotable fluid at R calf, wounds are healed, pedal pulses are palpable, R great toe with some mild subungal drainage   Medical Decision Making   Brenda Carney is a 66 y.o. year old female who presents s/p R pop-pop BPG w/ ips NR GSV, Linton patch  Pt has a likely seroma in R calf.  We discussed non-operative mgmt vs proceeding with incision and drainage. Pt favors proceeding with I&D R calf seroma. Risk and benefits discussed with patient.  Risk include but are not limited to bleeding, infection, and need for additional procedures. She will be scheduled for 26 JUN 18. Suspect probably limited paronychia involving R great toe.  Pt is going to get back into her Podiatrist to address such. If she has not done such by 26 JUN 18, will plan on examining the R great toe under anesthesia and possibly removing the R great toenail  vs partial excision as part of the above procedure. Thank you for allowing [place] to participate in this patient's care.   Adele Barthel, MD, FACS [facility] Office: [phone] Pager: [phone]

## 2017-06-19 NOTE — Interval H&P Note (Signed)
   History and Physical Update  The patient was interviewed and re-examined.  The patient's previous History and Physical has been reviewed and is unchanged from my consult except for: interval drainage of the right calf seroma.  On exam, the seroma is essentially resolved with minimal erythema without tenderness to light touch.  Will cancel the right calf I&D.  Adele Barthel, MD, FACS [facility] Office: [phone] Pager: [phone]  06/19/2017, 10:30 AM

## 2017-06-19 NOTE — Anesthesia Preprocedure Evaluation (Deleted)
Anesthesia Evaluation  Patient identified by MRN, date of birth, ID band Patient awake    Reviewed: Allergy & Precautions, NPO status , Patient's Chart, lab work & pertinent test results  Airway Mallampati: III  TM Distance: >3 FB Neck ROM: Full    Dental  (+) Dental Advisory Given   Pulmonary asthma , COPD (mild, controlled),  COPD inhaler, former smoker,    breath sounds clear to auscultation       Cardiovascular hypertension, Pt. on medications + Peripheral Vascular Disease   Rhythm:Regular Rate:Normal  ECG: NSR, rate 84  Cardiologist is Glenetta Hew, MD, last office visit 06/07/17. Dr. Ellyn Hack comments on Cardiac CT results that pt needs a stress test. Telephone encounter 06/18/17 by Michaele Offer, RN notes pt can proceed with I&D of seroma surgery prior to getting stress test.     Neuro/Psych  Headaches, Depression TIA   GI/Hepatic Neg liver ROS,   Endo/Other  negative endocrine ROS  Renal/GU negative Renal ROS     Musculoskeletal  (+) Arthritis ,   Abdominal   Peds  Hematology negative hematology ROS (+)   Anesthesia Other Findings Hyperlipidemia   Reproductive/Obstetrics                            Lab Results  Component Value Date   WBC 7.5 05/15/2017   HGB 13.6 05/15/2017   HCT 40.1 05/15/2017   MCV 98.0 05/15/2017   PLT 308 05/15/2017   Lab Results  Component Value Date   CREATININE 0.70 05/15/2017   BUN 7 05/15/2017   NA 139 05/15/2017   K 3.3 (L) 05/15/2017   CL 103 05/15/2017   CO2 29 05/15/2017    Anesthesia Physical  Anesthesia Plan  ASA: III  Anesthesia Plan: General   Post-op Pain Management:    Induction: Intravenous  PONV Risk Score and Plan: 3 and Ondansetron, Dexamethasone, Propofol and Midazolam  Airway Management Planned: LMA  Additional Equipment:   Intra-op Plan:   Post-operative Plan: Extubation in OR  Informed Consent: I have  reviewed the patients History and Physical, chart, labs and discussed the procedure including the risks, benefits and alternatives for the proposed anesthesia with the patient or authorized representative who has indicated his/her understanding and acceptance.   Dental advisory given  Plan Discussed with: CRNA  Anesthesia Plan Comments:       Anesthesia Quick Evaluation

## 2017-06-24 HISTORY — PX: NM MYOVIEW LTD: HXRAD82

## 2017-06-28 ENCOUNTER — Telehealth: Payer: Self-pay | Admitting: *Deleted

## 2017-06-28 DIAGNOSIS — R931 Abnormal findings on diagnostic imaging of heart and coronary circulation: Secondary | ICD-10-CM | POA: Insufficient documentation

## 2017-06-28 DIAGNOSIS — I2510 Atherosclerotic heart disease of native coronary artery without angina pectoris: Secondary | ICD-10-CM | POA: Insufficient documentation

## 2017-06-28 DIAGNOSIS — I739 Peripheral vascular disease, unspecified: Secondary | ICD-10-CM

## 2017-06-28 NOTE — Telephone Encounter (Signed)
-----   Message from Leonie Man, MD sent at 06/15/2017 11:44 PM EDT ----- Coronary calcium score was highly positive. With this result, I think we should probably consider going forward with a nuclear stress test (if unable to walk, we can do this with a pharmacologic study) --> not surprisingly there is evidence of significant coronary disease. There is also evidence of a dilated thoracic aorta which we can follow-up with a dedicated CT angiogram was we have evaluated the heart.  Glenetta Hew, MD  Pls forward to PCP: Dr. Leeroy Cha

## 2017-06-28 NOTE — Telephone Encounter (Signed)
Spoke to patient. Carotid  And coronary calcium score Result given . Verbalized understanding. Reviewed via mychart. PATIENT would like to try exercise stress myoview.  will Schedule testing patient aware

## 2017-06-28 NOTE — Telephone Encounter (Signed)
Left message to call back  

## 2017-06-28 NOTE — Telephone Encounter (Signed)
-----   Message from Leonie Man, MD sent at 06/22/2017 12:39 AM EDT ----- Carotid Doppler results: Mild-Moderate Bilateral atherosclerotic disease. Recommendation is to f/u 2 years due to plaque  Hard plaque in the carotid bifurcations, with 1-39% bilateral ICA stenosis. Patent vertebral arteries with antegrade flow.Normal subclavian arteries, bilaterally.  Glenetta Hew c  Pls forward to PCP: Leeroy Cha, MD

## 2017-07-02 DIAGNOSIS — M533 Sacrococcygeal disorders, not elsewhere classified: Secondary | ICD-10-CM | POA: Diagnosis not present

## 2017-07-02 DIAGNOSIS — M961 Postlaminectomy syndrome, not elsewhere classified: Secondary | ICD-10-CM | POA: Diagnosis not present

## 2017-07-02 DIAGNOSIS — Z79899 Other long term (current) drug therapy: Secondary | ICD-10-CM | POA: Diagnosis not present

## 2017-07-02 DIAGNOSIS — M5137 Other intervertebral disc degeneration, lumbosacral region: Secondary | ICD-10-CM | POA: Diagnosis not present

## 2017-07-02 DIAGNOSIS — G894 Chronic pain syndrome: Secondary | ICD-10-CM | POA: Diagnosis not present

## 2017-07-02 DIAGNOSIS — Z79891 Long term (current) use of opiate analgesic: Secondary | ICD-10-CM | POA: Diagnosis not present

## 2017-07-11 DIAGNOSIS — M7060 Trochanteric bursitis, unspecified hip: Secondary | ICD-10-CM | POA: Diagnosis not present

## 2017-07-12 ENCOUNTER — Telehealth (HOSPITAL_COMMUNITY): Payer: Self-pay

## 2017-07-12 NOTE — Telephone Encounter (Signed)
Encounter complete. 

## 2017-07-13 ENCOUNTER — Telehealth (HOSPITAL_COMMUNITY): Payer: Self-pay

## 2017-07-13 NOTE — Telephone Encounter (Signed)
Encounter complete. 

## 2017-07-17 ENCOUNTER — Ambulatory Visit (HOSPITAL_COMMUNITY)
Admission: RE | Admit: 2017-07-17 | Discharge: 2017-07-17 | Disposition: A | Discharge status: Home / Self Care | Payer: Medicare Other | Source: Ambulatory Visit | Attending: Internal Medicine | Admitting: Internal Medicine

## 2017-07-17 DIAGNOSIS — R931 Abnormal findings on diagnostic imaging of heart and coronary circulation: Secondary | ICD-10-CM | POA: Insufficient documentation

## 2017-07-17 DIAGNOSIS — Z87891 Personal history of nicotine dependence: Secondary | ICD-10-CM | POA: Diagnosis not present

## 2017-07-17 DIAGNOSIS — I10 Essential (primary) hypertension: Secondary | ICD-10-CM | POA: Diagnosis not present

## 2017-07-17 DIAGNOSIS — Z8673 Personal history of transient ischemic attack (TIA), and cerebral infarction without residual deficits: Secondary | ICD-10-CM | POA: Diagnosis not present

## 2017-07-17 DIAGNOSIS — R42 Dizziness and giddiness: Secondary | ICD-10-CM | POA: Insufficient documentation

## 2017-07-17 DIAGNOSIS — J449 Chronic obstructive pulmonary disease, unspecified: Secondary | ICD-10-CM | POA: Insufficient documentation

## 2017-07-17 DIAGNOSIS — R0609 Other forms of dyspnea: Secondary | ICD-10-CM | POA: Diagnosis not present

## 2017-07-17 DIAGNOSIS — I739 Peripheral vascular disease, unspecified: Secondary | ICD-10-CM | POA: Diagnosis not present

## 2017-07-17 DIAGNOSIS — Z8249 Family history of ischemic heart disease and other diseases of the circulatory system: Secondary | ICD-10-CM | POA: Insufficient documentation

## 2017-07-17 LAB — MYOCARDIAL PERFUSION IMAGING
LV dias vol: 81 mL (ref 46–106)
LV sys vol: 28 mL
Peak HR: 104 {beats}/min
Rest HR: 80 {beats}/min
SDS: 0
SRS: 1
SSS: 1
TID: 1.21

## 2017-07-17 MED ORDER — REGADENOSON 0.4 MG/5ML IV SOLN
0.4000 mg | Freq: Once | INTRAVENOUS | Status: AC
  Administered 2017-07-17: 0.4 mg via INTRAVENOUS

## 2017-07-17 MED ORDER — TECHNETIUM TC 99M TETROFOSMIN IV KIT
30.6000 | PACK | Freq: Once | INTRAVENOUS | Status: AC | PRN
  Administered 2017-07-17: 30.6 via INTRAVENOUS
  Filled 2017-07-17: qty 31

## 2017-07-17 MED ORDER — TECHNETIUM TC 99M TETROFOSMIN IV KIT
10.5000 | PACK | Freq: Once | INTRAVENOUS | Status: AC | PRN
  Administered 2017-07-17: 10.5 via INTRAVENOUS
  Filled 2017-07-17: qty 11

## 2017-07-17 NOTE — Progress Notes (Signed)
Stress Test looked good!! No sign of significant Heart Artery Disease.  Pump function is normal.  Good news!!.  Shelbia Scinto W, MD  pls forward to PCP: Leeroy Cha, MD

## 2017-07-25 ENCOUNTER — Ambulatory Visit (INDEPENDENT_AMBULATORY_CARE_PROVIDER_SITE_OTHER): Payer: Medicare Other | Admitting: Cardiology

## 2017-07-25 ENCOUNTER — Encounter: Payer: Self-pay | Admitting: Cardiology

## 2017-07-25 VITALS — BP 142/92 | HR 87 | Ht 65.0 in | Wt 153.0 lb

## 2017-07-25 DIAGNOSIS — I10 Essential (primary) hypertension: Secondary | ICD-10-CM

## 2017-07-25 DIAGNOSIS — E785 Hyperlipidemia, unspecified: Secondary | ICD-10-CM

## 2017-07-25 DIAGNOSIS — I2510 Atherosclerotic heart disease of native coronary artery without angina pectoris: Secondary | ICD-10-CM | POA: Diagnosis not present

## 2017-07-25 DIAGNOSIS — I739 Peripheral vascular disease, unspecified: Secondary | ICD-10-CM | POA: Diagnosis not present

## 2017-07-25 DIAGNOSIS — Z720 Tobacco use: Secondary | ICD-10-CM | POA: Diagnosis not present

## 2017-07-25 MED ORDER — AMLODIPINE BESYLATE 10 MG PO TABS: 10.0000 mg | ORAL_TABLET | Freq: Every day | ORAL | 3 refills | Status: DC

## 2017-07-25 NOTE — Patient Instructions (Signed)
MEDICATION INCREASE  INCREASE AMLODIPINE 10 MG   -- ONE TABLET DAILY    CONTINUE GOING TO LIPID CLINIC- AT [facility] [address]    KEEP LEGS ELEVATED AS MUCH AS POSSIBLE TO HELP WITH SWELLING.  IF SITTING OR GOING ON TRIP (AIRPALNE)TRY TO WEAR SUPPORT KNEE HI-  (COMPRESSION - 8-10 MMHG)   Your physician wants you to follow-up in [place] HARDING.You will receive a reminder letter in the mail two months in advance. If you don't receive a letter, please call our office to schedule the follow-up appointment.   If you need a refill on your cardiac medications before your next appointment, please call your pharmacy.

## 2017-07-25 NOTE — Progress Notes (Signed)
PCP: Leeroy Cha, MD  Clinic Note: Chief Complaint  Patient presents with  . Follow-up    here to discuss test rsults.  . Hypertension  . Hyperlipidemia  . Coronary Artery Disease    + Coronary Calcium Score - Negative Myoview    HPI: Brenda Carney is a 66 y.o. female with a PMH below who presents today for 6 week follow-up for cardiac risk factors and PAD/hypertension.   Brenda Carney was last seen on 06/04/2017 -- We ordered a coronary CTA to assess her cardiac risk. This was abnormal and therefore we checked a Myoview.  Recent Hospitalizations: none  Studies Personally Reviewed - (if available, images/films reviewed: From Epic Chart or Care Everywhere)  Coronary calcium scoring: June 2018: Score 1106. 98 percentile for age. Aneurysmal dilation of the ascending aorta measuring 40 mm. Recommend CTA or MRA.  Myoview 07/17/2017: Normal EF, 66%. No ST changes. Normal study. LOW RISK.  Interval History: Brenda Carney presents today overall doing very well. She has no major complaints. She indicates that she quit smoking when she had her PAD surgery, but is a little upset about having gained 5 pounds besides that she says that since he quit smoking, her exertional dyspnea is improved. She still has occasional leg swelling (most notably when she went on a trip on an airplane) that goes down which puts her feet up at night. She uses support stockings, but notes that sometimes she wears a long time her legs get somewhat uncomfortable.  Otherwise cardiac review of symptoms is negative as follows: No chest pain or shortness of breath with rest or exertion.  No PND, orthopnea  No palpitations, lightheadedness, dizziness, weakness or. No  syncope/near syncope, or TIA/amaurosis fugax symptoms. No melena, hematochezia, hematuria, or epstaxis.  ROS: A comprehensive was performed. Review of Systems  Constitutional: Negative for malaise/fatigue and weight loss (5 pound weight gain  since quitting smoking).  HENT: Negative for congestion.        She has a dry mouth. Recently treated oral thrush  Respiratory: Positive for cough (Mild morning cough). Negative for sputum production.   Cardiovascular: Negative for claudication (Notably improved after surgery).  Gastrointestinal: Negative for heartburn.  Musculoskeletal: Negative.   Neurological: Negative for dizziness and weakness.  Psychiatric/Behavioral: The patient is not nervous/anxious.   All other systems reviewed and are negative.  I have reviewed and (if needed) personally updated the patient's problem list, medications, allergies, past medical and surgical history, social and family history.   Past Medical History:  Diagnosis Date  . Anemia    as a younger woman  . Arthritis    "thumbs; hips" (05/14/2017)  . 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 Cerebrovascular disease    Carotid dopplers which showed no significant increase in velocities, extremely minimal on the left, antegrade vertebral bilaterally.  . Cervical spondylosis   . Chronic lower back pain   . COPD (chronic obstructive pulmonary disease) ([place])    "little bit" (05/14/2017)  . Coronary artery disease, non-occlusive    Coronary calcium scoring: June 2018: Score 1106. 98 percentile for age -[place]  . Depression   . Exercise-induced asthma with acute exacerbation    "only in very hot weather" (05/14/2017)  . GERD (gastroesophageal reflux disease)   . FBPZWCHE(527.7)    "monthly" (05/14/2017)  . History of IBS    resovlved  . History of kidney stones   . 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 Hyperlipidemia with target LDL less than 70    Mostly statin intolerant,. Currently on Livalo 4 mg  . Hypertension, benign   . Lumbosacral spondylosis   . Migraine headache    "were monthly; none recently" (05/14/2017)  . PAD (peripheral artery  disease) (HCC)    Status post right above-the-knee-below the knee popliteal artery bypass; postop right ABI 0.96.  Marland Kitchen Pneumonia "several times"  . Sleep apnea    does not wear CPAP; "think it was medication related" (05/14/2017)  . Thoracic aortic ectasia ([facility])    ~40 mm on Coronary Calcium Score CT - recommend f/u CTA or MRA  . TIA (transient ischemic attack) 09/2011   "several"    Past Surgical History:  Procedure Laterality Date  . ABDOMINAL AORTOGRAM W/LOWER EXTREMITY N/A 05/14/2017   Procedure: Abdominal Aortogram w/Lower Extremity;  Surgeon: Conrad Butlertown, MD;  Location: [facility];  Service: Cardiovascular;  Laterality: N/A;  . ACROMIO-CLAVICULAR JOINT REPAIR Left 1990s   "I think it was called an AC joint removal"  . BACK SURGERY    . BYPASS GRAFT POPLITEAL TO POPLITEAL Right 05/15/2017   Procedure: BYPASS GRAFT ABOVE KNEE POPLITEAL TO BELOW KNEE POPLITEAL ARTERY;  Surgeon: Conrad Rye [place], MD;  Location: [facility];  Service: Vascular;  Laterality: Right;  . FOOT FRACTURE SURGERY Bilateral    multiple procedures  . FRACTURE SURGERY    . MAXIMUM ACCESS (MAS)POSTERIOR LUMBAR INTERBODY FUSION (PLIF) 1 LEVEL N/A 09/30/2014   Procedure: FOR MAXIMUM ACCESS (MAS) POSTERIOR LUMBAR INTERBODY FUSION (PLIF) 1 LEVEL;  Surgeon: Eustace Moore, MD;  Location: [facility];  Service: Neurosurgery;  Laterality: N/A;  FOR MAXIMUM ACCESS (MAS) POSTERIOR LUMBAR INTERBODY FUSION (PLIF) 1 LEVEL LUMBAR 4-5  . MULTIPLE TOOTH EXTRACTIONS Right 04/2017  . NM MYOVIEW LTD  06/2017    Normal EF, 66%. No ST changes. Normal study. LOW RISK.  Marland Kitchen TRANSTHORACIC ECHOCARDIOGRAM  09/07/2013   Done to evaluate TIA: Normal LV size and function. EF 55-60%. GR 1 DD. Mild left atrial dilation. Mildly calcified mitral leaflets. Otherwise normal.    Current Meds  Medication Sig  . ARIPiprazole (ABILIFY) 2 MG tablet Take 2 mg by mouth daily.  Marland Kitchen aspirin EC 81 MG tablet Take 81 mg by mouth 2 (two) times daily.  .  Aspirin-Acetaminophen-Caffeine (EXCEDRIN PO) Take 2 tablets by mouth 2 (two) times daily as needed (pain).  . Coenzyme Q10 (COQ-10) 200 MG CAPS Take 1 capsule by mouth daily.  Marland Kitchen desvenlafaxine (PRISTIQ) 100 MG 24 hr tablet Take 100 mg by mouth daily.  . Evolocumab (REPATHA SURECLICK) 353 MG/ML SOAJ Inject 140 mg into the skin every 14 (fourteen) days.  . fenofibrate (TRICOR) 145 MG tablet Take 145 mg by mouth daily.   Marland Kitchen KRILL OIL PO Take 1 capsule by mouth daily.  Marland Kitchen lisinopril-hydrochlorothiazide (PRINZIDE,ZESTORETIC) 20-12.5 MG per tablet Take 1 tablet by mouth 2 (two) times daily.   . mometasone (NASONEX) 50 MCG/ACT nasal spray Place 2 sprays into the nose daily as needed (allergies).  . Multiple Vitamin (MULTIVITAMIN WITH MINERALS) TABS tablet Take 1 tablet by mouth daily.  . mupirocin ointment (BACTROBAN) 2 % Apply 1 application topically as needed (breakouts).   Marland Kitchen OVER THE COUNTER MEDICATION Take 4 capsules by mouth daily as needed (energy). SeroVital otc supplement  . Pitavastatin Calcium 4 MG TABS Take 4 mg by mouth daily.   . potassium chloride (K-DUR,KLOR-CON)  10 MEQ tablet Take 10 mEq by mouth daily.  Marland Kitchen Propylene Glycol-Glycerin (SOOTHE OP) Apply 1 drop to eye daily as needed (dry eyes).  . traMADol (ULTRAM) 50 MG tablet Take 100 mg by mouth 3 (three) times daily as needed for moderate pain.   Marland Kitchen zolpidem (AMBIEN) 10 MG tablet Take 20 mg by mouth at bedtime.   . [DISCONTINUED] amLODipine (NORVASC) 5 MG tablet TAKE 1 TABLET ONCE DAILY.    Allergies  Allergen Reactions  . Penicillins Swelling    TONGUE SWELLING  PATIENT HAD A PCN REACTION WITH IMMEDIATE RASH, FACIAL/TONGUE/THROAT SWELLING, SOB, OR LIGHTHEADEDNESS WITH HYPOTENSION:  #  #  #  YES  #  #  #   Has patient had a PCN reaction causing severe rash involving mucus membranes or skin necrosis: No Has patient had a PCN reaction that required hospitalization: No Has patient had a PCN reaction occurring within the last 10 years:  No   . Lyrica [Pregabalin] Other (See Comments)    DRY MOUTH    Social History   Social History  . Marital status: Widowed    Spouse name: [name]   . Number of children: 0  . Years of education: COLLEGE   Occupational History  . Horse Breeder     Social History Main Topics  . Smoking status: Former Smoker    Packs/day: 0.50    Years: 30.00    Types: Cigarettes    Quit date: 04/24/2017  . Smokeless tobacco: Never Used  . Alcohol use 1.8 oz/week    3 Glasses of wine per week  . Drug use: No  . Sexual activity: No   Other Topics Concern  . None   Social History Narrative   Patient lives at home with her husband [name]. Patient has no children.    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    Patient has BA degree.    Smokes 2-3 cigarettes /day -- former heavy smoker (failed "quitting") -- now says she has truly "QUIT"    family history includes Heart attack in her mother; Stroke in her father and mother.  Wt Readings from Last 3 Encounters:  07/25/17 153 lb (69.4 kg)  07/17/17 147 lb (66.7 kg)  06/19/17 147 lb (66.7 kg)    PHYSICAL EXAM BP (!) 142/92   Pulse 87   Ht 5\' 5"  (1.651 m)   Wt 153 lb (69.4 kg)   BMI 25.46 kg/m  Physical Exam  Constitutional: She is oriented to person, place, and time. She appears well-developed and well-nourished.  HENT:  Head: Normocephalic and atraumatic.  Nose: Nose normal.  Mouth/Throat: Oropharynx is clear and moist. No oropharyngeal exudate.  Eyes: Pupils are equal, round, and reactive to light. EOM are normal.  Neck: Normal range of motion. Neck supple. No JVD present.  Cardiovascular: Normal rate, regular rhythm, S1 normal and S2 normal.   No extrasystoles are present. PMI is not displaced.  Exam reveals no gallop, no distant heart sounds, no friction rub, no midsystolic click and no opening snap.   Murmur (1/2 SEM @ R&LUSB) heard.  Harsh crescendo-decrescendo midsystolic murmur is present with a grade of  1/6  at the upper right sternal border, upper left sternal border radiating to the neck Pulses:      Carotid pulses are 0 on the right side, and 0 on the left side.      Dorsalis pedis pulses are 2+ on the right side, and 1+  on the left side.       Posterior tibial pulses are 2+ on the right side, and 1+ on the left side.  Pulmonary/Chest: Effort normal and breath sounds normal. No respiratory distress. She has no wheezes. She has no rales. She exhibits no tenderness.  Abdominal: Soft. Bowel sounds are normal. She exhibits no distension and no mass. There is no tenderness. There is no rebound and no guarding.  Musculoskeletal: Normal range of motion. She exhibits no edema.  Neurological: She is alert and oriented to person, place, and time.  Skin: Skin is warm and dry. No erythema.  R forearm abrasion; mild splotchy skin on arms.  Psychiatric: She has a normal mood and affect. Her behavior is normal. Judgment and thought content normal.  Nursing note and vitals reviewed.   Adult ECG Report N/a   Other studies Reviewed: Additional studies/ records that were reviewed today include:  Recent Labs:   Lab Results  Component Value Date   CHOL 251 (H) 05/15/2017   HDL 47 05/15/2017   LDLCALC 170 (H) 05/15/2017   LDLDIRECT 186 (H) 03/30/2015   TRIG 168 (H) 05/15/2017   CHOLHDL 5.3 05/15/2017   - Now on Repatha - started 6 wks ago.   ASSESSMENT / PLAN: Problem List Items Addressed This Visit    Coronary artery disease, non-occlusive - Primary (Chronic)    Based on her high coronary artery calcium score, she clearly has coronary disease, however her negative stress test and echo symptoms would suggest this is nonocclusive at this time. Explained to her the pathophysiology.  Plan: Continue risk factor modification including lipid control, blood pressure control and smoking cessation. Now, she needs to start exercising.      Relevant Medications   amLODipine (NORVASC) 10 MG tablet    Essential hypertension (Chronic)    Still has well controlled as we would like to be on amlodipine and lisinopril and HCTZ. Plan: Increase amlodipine to 10 mg daily.      Relevant Medications   amLODipine (NORVASC) 10 MG tablet   Hyperlipidemia with target LDL less than 70 (Chronic)    Currently on Livalo and fenofibrate and just started Repatha. Is due to have labs checked soon by [facility] pharmacist team.      Relevant Medications   amLODipine (NORVASC) 10 MG tablet   PAD (peripheral artery disease) ([place]) (Chronic)    Followed by vascular surgery. Now with evidence of CAD (albeit non-occlusive) we need to increase lipid management and risk factor management.  Increasing blood pressure control by increasing amlodipine. She has quit smoking this. We have started Repatha plus Livalo.      Relevant Medications   amLODipine (NORVASC) 10 MG tablet   Tobacco abuse (Chronic)    Smoking cessation instruction/counseling given:  commended patient for quitting and reviewed strategies for preventing relapses         Current medicines are reviewed at length with the patient today. (+/- concerns) n/a The following changes have been made:increase Amlodipine to 10 mg  Patient Instructions  MEDICATION INCREASE  INCREASE AMLODIPINE 10 MG   -- ONE TABLET DAILY    CONTINUE GOING TO LIPID CLINIC- AT [facility] -- [address]    KEEP LEGS ELEVATED AS MUCH AS POSSIBLE TO HELP WITH SWELLING.  IF SITTING OR GOING ON TRIP (AIRPALNE)TRY TO WEAR SUPPORT KNEE HI-  (COMPRESSION - 8-10 MMHG)   Your physician wants you to follow-up in [place] [facility].You will receive a reminder letter in the  mail two months in advance. If you don't receive a letter, please call our office to schedule the follow-up appointment.   If you need a refill on your cardiac medications before your next appointment, please call your pharmacy.    Studies Ordered:   No orders of the defined types were placed in this  encounter.     Glenetta Hew, M.D., M.S. Interventional Cardiologist   Pager # [phone] Phone # [phone] [address] [place], [place] [address]

## 2017-07-26 ENCOUNTER — Encounter: Payer: Self-pay | Admitting: Cardiology

## 2017-07-26 NOTE — Assessment & Plan Note (Signed)
Followed by vascular surgery. Now with evidence of CAD (albeit non-occlusive) we need to increase lipid management and risk factor management.  Increasing blood pressure control by increasing amlodipine. She has quit smoking this. We have started Repatha plus Livalo.

## 2017-07-26 NOTE — Assessment & Plan Note (Signed)
Still has well controlled as we would like to be on amlodipine and lisinopril and HCTZ. Plan: Increase amlodipine to 10 mg daily.

## 2017-07-26 NOTE — Assessment & Plan Note (Signed)
Smoking cessation instruction/counseling given:  commended patient for quitting and reviewed strategies for preventing relapses 

## 2017-07-26 NOTE — Assessment & Plan Note (Signed)
Currently on Livalo and fenofibrate and just started Repatha. Is due to have labs checked soon by [facility] pharmacist team.

## 2017-07-26 NOTE — Assessment & Plan Note (Signed)
Based on her high coronary artery calcium score, she clearly has coronary disease, however her negative stress test and echo symptoms would suggest this is nonocclusive at this time. Explained to her the pathophysiology.  Plan: Continue risk factor modification including lipid control, blood pressure control and smoking cessation. Now, she needs to start exercising.

## 2017-07-30 ENCOUNTER — Telehealth: Payer: Self-pay | Admitting: Pharmacist Clinician (PhC)/ Clinical Pharmacy Specialist

## 2017-07-30 DIAGNOSIS — M5137 Other intervertebral disc degeneration, lumbosacral region: Secondary | ICD-10-CM | POA: Diagnosis not present

## 2017-07-30 DIAGNOSIS — M961 Postlaminectomy syndrome, not elsewhere classified: Secondary | ICD-10-CM | POA: Diagnosis not present

## 2017-07-30 DIAGNOSIS — M79606 Pain in leg, unspecified: Secondary | ICD-10-CM | POA: Diagnosis not present

## 2017-07-30 DIAGNOSIS — G894 Chronic pain syndrome: Secondary | ICD-10-CM | POA: Diagnosis not present

## 2017-07-30 DIAGNOSIS — Z79891 Long term (current) use of opiate analgesic: Secondary | ICD-10-CM | POA: Diagnosis not present

## 2017-07-30 DIAGNOSIS — Z79899 Other long term (current) drug therapy: Secondary | ICD-10-CM | POA: Diagnosis not present

## 2017-07-30 DIAGNOSIS — E785 Hyperlipidemia, unspecified: Secondary | ICD-10-CM

## 2017-07-30 NOTE — Telephone Encounter (Signed)
Patient approved thru insurance for [place].  Copay $375/month.  We will give her every 3rd month samples to help offset her costs.    Patient will get lipid labs drawn after 5th dose.    Spoke with patient, she is agreeable to this plan.

## 2017-08-01 DIAGNOSIS — F4312 Post-traumatic stress disorder, chronic: Secondary | ICD-10-CM | POA: Diagnosis not present

## 2017-08-01 DIAGNOSIS — G4709 Other insomnia: Secondary | ICD-10-CM | POA: Diagnosis not present

## 2017-08-01 DIAGNOSIS — G4730 Sleep apnea, unspecified: Secondary | ICD-10-CM | POA: Diagnosis not present

## 2017-08-01 DIAGNOSIS — Z634 Disappearance and death of family member: Secondary | ICD-10-CM | POA: Diagnosis not present

## 2017-08-01 DIAGNOSIS — F39 Unspecified mood [affective] disorder: Secondary | ICD-10-CM | POA: Diagnosis not present

## 2017-08-28 DIAGNOSIS — M533 Sacrococcygeal disorders, not elsewhere classified: Secondary | ICD-10-CM | POA: Diagnosis not present

## 2017-08-28 DIAGNOSIS — G894 Chronic pain syndrome: Secondary | ICD-10-CM | POA: Diagnosis not present

## 2017-08-28 DIAGNOSIS — Z79899 Other long term (current) drug therapy: Secondary | ICD-10-CM | POA: Diagnosis not present

## 2017-08-28 DIAGNOSIS — Z79891 Long term (current) use of opiate analgesic: Secondary | ICD-10-CM | POA: Diagnosis not present

## 2017-08-28 DIAGNOSIS — M961 Postlaminectomy syndrome, not elsewhere classified: Secondary | ICD-10-CM | POA: Diagnosis not present

## 2017-08-28 DIAGNOSIS — M5137 Other intervertebral disc degeneration, lumbosacral region: Secondary | ICD-10-CM | POA: Diagnosis not present

## 2017-09-25 DIAGNOSIS — G894 Chronic pain syndrome: Secondary | ICD-10-CM | POA: Diagnosis not present

## 2017-09-25 DIAGNOSIS — M5137 Other intervertebral disc degeneration, lumbosacral region: Secondary | ICD-10-CM | POA: Diagnosis not present

## 2017-09-25 DIAGNOSIS — M47817 Spondylosis without myelopathy or radiculopathy, lumbosacral region: Secondary | ICD-10-CM | POA: Diagnosis not present

## 2017-09-25 DIAGNOSIS — Z79899 Other long term (current) drug therapy: Secondary | ICD-10-CM | POA: Diagnosis not present

## 2017-09-25 DIAGNOSIS — M533 Sacrococcygeal disorders, not elsewhere classified: Secondary | ICD-10-CM | POA: Diagnosis not present

## 2017-09-25 DIAGNOSIS — Z79891 Long term (current) use of opiate analgesic: Secondary | ICD-10-CM | POA: Diagnosis not present

## 2017-10-02 DIAGNOSIS — G4709 Other insomnia: Secondary | ICD-10-CM | POA: Diagnosis not present

## 2017-10-02 DIAGNOSIS — F4312 Post-traumatic stress disorder, chronic: Secondary | ICD-10-CM | POA: Diagnosis not present

## 2017-10-02 DIAGNOSIS — F39 Unspecified mood [affective] disorder: Secondary | ICD-10-CM | POA: Diagnosis not present

## 2017-10-02 DIAGNOSIS — F341 Dysthymic disorder: Secondary | ICD-10-CM | POA: Diagnosis not present

## 2017-10-03 DIAGNOSIS — Z1239 Encounter for other screening for malignant neoplasm of breast: Secondary | ICD-10-CM | POA: Diagnosis not present

## 2017-10-03 DIAGNOSIS — G43909 Migraine, unspecified, not intractable, without status migrainosus: Secondary | ICD-10-CM | POA: Diagnosis not present

## 2017-10-03 DIAGNOSIS — F329 Major depressive disorder, single episode, unspecified: Secondary | ICD-10-CM | POA: Diagnosis not present

## 2017-10-03 DIAGNOSIS — I10 Essential (primary) hypertension: Secondary | ICD-10-CM | POA: Diagnosis not present

## 2017-10-03 DIAGNOSIS — Z95828 Presence of other vascular implants and grafts: Secondary | ICD-10-CM | POA: Diagnosis not present

## 2017-10-03 DIAGNOSIS — G894 Chronic pain syndrome: Secondary | ICD-10-CM | POA: Diagnosis not present

## 2017-10-03 DIAGNOSIS — Z0000 Encounter for general adult medical examination without abnormal findings: Secondary | ICD-10-CM | POA: Diagnosis not present

## 2017-10-03 DIAGNOSIS — Z23 Encounter for immunization: Secondary | ICD-10-CM | POA: Diagnosis not present

## 2017-10-03 DIAGNOSIS — J449 Chronic obstructive pulmonary disease, unspecified: Secondary | ICD-10-CM | POA: Diagnosis not present

## 2017-10-03 DIAGNOSIS — E782 Mixed hyperlipidemia: Secondary | ICD-10-CM | POA: Diagnosis not present

## 2017-10-18 DIAGNOSIS — J449 Chronic obstructive pulmonary disease, unspecified: Secondary | ICD-10-CM | POA: Diagnosis not present

## 2017-11-06 DIAGNOSIS — M47817 Spondylosis without myelopathy or radiculopathy, lumbosacral region: Secondary | ICD-10-CM | POA: Diagnosis not present

## 2017-11-06 DIAGNOSIS — Z79899 Other long term (current) drug therapy: Secondary | ICD-10-CM | POA: Diagnosis not present

## 2017-11-06 DIAGNOSIS — Z79891 Long term (current) use of opiate analgesic: Secondary | ICD-10-CM | POA: Diagnosis not present

## 2017-11-06 DIAGNOSIS — G894 Chronic pain syndrome: Secondary | ICD-10-CM | POA: Diagnosis not present

## 2017-11-06 DIAGNOSIS — M5137 Other intervertebral disc degeneration, lumbosacral region: Secondary | ICD-10-CM | POA: Diagnosis not present

## 2017-11-06 DIAGNOSIS — M533 Sacrococcygeal disorders, not elsewhere classified: Secondary | ICD-10-CM | POA: Diagnosis not present

## 2017-11-09 DIAGNOSIS — L700 Acne vulgaris: Secondary | ICD-10-CM | POA: Diagnosis not present

## 2017-11-09 DIAGNOSIS — L0202 Furuncle of face: Secondary | ICD-10-CM | POA: Diagnosis not present

## 2017-11-09 DIAGNOSIS — B0089 Other herpesviral infection: Secondary | ICD-10-CM | POA: Diagnosis not present

## 2017-12-24 DIAGNOSIS — J189 Pneumonia, unspecified organism: Secondary | ICD-10-CM | POA: Insufficient documentation

## 2017-12-24 DIAGNOSIS — F32A Depression, unspecified: Secondary | ICD-10-CM | POA: Insufficient documentation

## 2017-12-24 DIAGNOSIS — G8929 Other chronic pain: Secondary | ICD-10-CM | POA: Insufficient documentation

## 2017-12-24 DIAGNOSIS — M47817 Spondylosis without myelopathy or radiculopathy, lumbosacral region: Secondary | ICD-10-CM | POA: Insufficient documentation

## 2017-12-24 DIAGNOSIS — M47812 Spondylosis without myelopathy or radiculopathy, cervical region: Secondary | ICD-10-CM | POA: Insufficient documentation

## 2017-12-24 DIAGNOSIS — K219 Gastro-esophageal reflux disease without esophagitis: Secondary | ICD-10-CM | POA: Insufficient documentation

## 2017-12-24 DIAGNOSIS — M545 Low back pain, unspecified: Secondary | ICD-10-CM | POA: Insufficient documentation

## 2017-12-24 DIAGNOSIS — G43909 Migraine, unspecified, not intractable, without status migrainosus: Secondary | ICD-10-CM | POA: Insufficient documentation

## 2017-12-24 DIAGNOSIS — Z87820 Personal history of traumatic brain injury: Secondary | ICD-10-CM | POA: Insufficient documentation

## 2017-12-24 DIAGNOSIS — J45990 Exercise induced bronchospasm: Secondary | ICD-10-CM | POA: Insufficient documentation

## 2017-12-24 DIAGNOSIS — I77810 Thoracic aortic ectasia: Secondary | ICD-10-CM | POA: Insufficient documentation

## 2017-12-24 DIAGNOSIS — Z87442 Personal history of urinary calculi: Secondary | ICD-10-CM | POA: Insufficient documentation

## 2017-12-24 DIAGNOSIS — M1990 Unspecified osteoarthritis, unspecified site: Secondary | ICD-10-CM | POA: Insufficient documentation

## 2017-12-24 DIAGNOSIS — D649 Anemia, unspecified: Secondary | ICD-10-CM | POA: Insufficient documentation

## 2017-12-24 DIAGNOSIS — I10 Essential (primary) hypertension: Secondary | ICD-10-CM | POA: Insufficient documentation

## 2017-12-24 DIAGNOSIS — F329 Major depressive disorder, single episode, unspecified: Secondary | ICD-10-CM | POA: Insufficient documentation

## 2017-12-24 DIAGNOSIS — J441 Chronic obstructive pulmonary disease with (acute) exacerbation: Secondary | ICD-10-CM | POA: Insufficient documentation

## 2017-12-24 DIAGNOSIS — I671 Cerebral aneurysm, nonruptured: Secondary | ICD-10-CM | POA: Insufficient documentation

## 2017-12-24 DIAGNOSIS — G4730 Sleep apnea, unspecified: Secondary | ICD-10-CM | POA: Insufficient documentation

## 2017-12-24 DIAGNOSIS — Z8719 Personal history of other diseases of the digestive system: Secondary | ICD-10-CM | POA: Insufficient documentation

## 2018-01-03 DIAGNOSIS — M961 Postlaminectomy syndrome, not elsewhere classified: Secondary | ICD-10-CM | POA: Diagnosis not present

## 2018-01-03 DIAGNOSIS — M5137 Other intervertebral disc degeneration, lumbosacral region: Secondary | ICD-10-CM | POA: Diagnosis not present

## 2018-01-03 DIAGNOSIS — G894 Chronic pain syndrome: Secondary | ICD-10-CM | POA: Diagnosis not present

## 2018-01-03 DIAGNOSIS — M79606 Pain in leg, unspecified: Secondary | ICD-10-CM | POA: Diagnosis not present

## 2018-01-07 DIAGNOSIS — H25813 Combined forms of age-related cataract, bilateral: Secondary | ICD-10-CM | POA: Diagnosis not present

## 2018-01-07 DIAGNOSIS — H524 Presbyopia: Secondary | ICD-10-CM | POA: Diagnosis not present

## 2018-01-07 DIAGNOSIS — H43813 Vitreous degeneration, bilateral: Secondary | ICD-10-CM | POA: Diagnosis not present

## 2018-01-07 DIAGNOSIS — H04123 Dry eye syndrome of bilateral lacrimal glands: Secondary | ICD-10-CM | POA: Diagnosis not present

## 2018-01-09 DIAGNOSIS — F4312 Post-traumatic stress disorder, chronic: Secondary | ICD-10-CM | POA: Diagnosis not present

## 2018-01-09 DIAGNOSIS — G4730 Sleep apnea, unspecified: Secondary | ICD-10-CM | POA: Diagnosis not present

## 2018-01-09 DIAGNOSIS — Z634 Disappearance and death of family member: Secondary | ICD-10-CM | POA: Diagnosis not present

## 2018-01-09 DIAGNOSIS — G4709 Other insomnia: Secondary | ICD-10-CM | POA: Diagnosis not present

## 2018-01-09 DIAGNOSIS — F39 Unspecified mood [affective] disorder: Secondary | ICD-10-CM | POA: Diagnosis not present

## 2018-01-09 DIAGNOSIS — F341 Dysthymic disorder: Secondary | ICD-10-CM | POA: Diagnosis not present

## 2018-01-21 ENCOUNTER — Ambulatory Visit (INDEPENDENT_AMBULATORY_CARE_PROVIDER_SITE_OTHER): Payer: Medicare Other | Admitting: Cardiology

## 2018-01-21 ENCOUNTER — Encounter: Payer: Self-pay | Admitting: Cardiology

## 2018-01-21 VITALS — BP 138/76 | HR 86 | Ht 65.0 in | Wt 161.0 lb

## 2018-01-21 DIAGNOSIS — I10 Essential (primary) hypertension: Secondary | ICD-10-CM | POA: Diagnosis not present

## 2018-01-21 DIAGNOSIS — I679 Cerebrovascular disease, unspecified: Secondary | ICD-10-CM

## 2018-01-21 DIAGNOSIS — E785 Hyperlipidemia, unspecified: Secondary | ICD-10-CM

## 2018-01-21 DIAGNOSIS — I739 Peripheral vascular disease, unspecified: Secondary | ICD-10-CM | POA: Diagnosis not present

## 2018-01-21 DIAGNOSIS — I77810 Thoracic aortic ectasia: Secondary | ICD-10-CM

## 2018-01-21 DIAGNOSIS — I2510 Atherosclerotic heart disease of native coronary artery without angina pectoris: Secondary | ICD-10-CM | POA: Diagnosis not present

## 2018-01-21 NOTE — Assessment & Plan Note (Signed)
Remains on Livalo fenofibrate alone Repatha. Labs look great. Total cholesterol was 141, triggers is 144, LDL 45 and HDL 67. Outstanding results.  Continue to follow-up with [facility] team

## 2018-01-21 NOTE — Assessment & Plan Note (Signed)
Followed by Dr. Bridgett Larsson from vascular surgery. No active claudication following her right femoropopliteal bypass. Aggressive respiratory dictation.

## 2018-01-21 NOTE — Assessment & Plan Note (Signed)
history of TIAs in the past - following carotid Dopplers

## 2018-01-21 NOTE — Assessment & Plan Note (Signed)
Blood pressure is borderline today. On initial check is 140/93 with a heart rate of 106. On my recheck was in the 130s with heart rate in the 80s. Currently on maxed out doses of amlodipine and lisinopril and HCTZ. Next up would probably be increasing the HCTZ component of her lisinopril and HCTZ, but I suspect that we may have to add another medicine. We'll ask her PCP to follow.

## 2018-01-21 NOTE — Assessment & Plan Note (Signed)
Elevated cardiac calcium score indicates that she does have a high burden of coronary disease, but not obstructed by Myoview stress test. Not actively having symptoms. Continue aggressive risk modification: Is hoping to start exercising, she is working on slowly quitting cigarettes, blood pressure is borderline, but stable. Lipids look much improved on Repatha.

## 2018-01-21 NOTE — Progress Notes (Signed)
PCP: Leeroy Cha, MD  Clinic Note: Chief Complaint  Patient presents with  . Follow-up    PAD; HLD (on Repatha)    HPI: Brenda Carney is a 67 y.o. female with a PMH below who presents today for 5 month follow-up for cardiac risk factors (lipid management) and PAD/hypertension.   Brenda Carney was initially seen in June, we e ordered a coronary CTA to assess her cardiac risk. This was abnormal and therefore we checked a Myoview. last seen on 07/25/2017 -- to follow-up coronary CTA (coronary CT scan findings recommended Myoview stress test but also follow up CTA of the chest to follow-up ascending aortic aneurysm. Myoview was read as low risk. She was referred to the lipid clinic - approved for Repatha - most recent labs checked in October showed excellent result.  Recent Hospitalizations: none  Studies Personally Reviewed - (if available, images/films reviewed: From Epic Chart or Care Everywhere)  None recently  Interval History: Brenda Carney presents today overall doing very well. She is overall doing well from a cardiac standpoint. Very excited about the results of her lipids from [place]. She does indicate that she still is a hard time fully quitting cigarettes, and seems to be cheating quite a bit.  She is trying to stay active and walking more. She seems to be bothered more by her hips than claudication now. She is hoping to try to join the aquatic center to do some water walking. She acknowledges that she probably overdid it is noted with eating over the holidays. Gained quite a bit of weight. From a cardiac standpoint she denies any resting or exertional chest tightness/pressure or dyspnea. She is deconditioned so she will get some exertional dyspnea but has improved. No PND, orthopnea or edema.  Remainder of cardiac review of symptoms: No palpitations, lightheadedness, dizziness, weakness or syncope/near syncope. No TIA/amaurosis fugax symptoms. No claudication.  ROS:  A comprehensive was performed. Review of Systems  Constitutional: Negative for malaise/fatigue and weight loss (5 pound weight gain since quitting smoking).  HENT: Negative for congestion.        She has a dry mouth. Recently treated oral thrush  Respiratory: Positive for cough (Mild morning cough). Negative for sputum production.   Cardiovascular: Negative for claudication (Notably improved after surgery).  Gastrointestinal: Negative for heartburn.  Musculoskeletal: Negative.   Neurological: Negative for dizziness and weakness.  Psychiatric/Behavioral: The patient is not nervous/anxious.   All other systems reviewed and are negative.  I have reviewed and (if needed) personally updated the patient's problem list, medications, allergies, past medical and surgical history, social and family history.   Past Medical History:  Diagnosis Date  . Anemia    as a younger woman  . Arthritis    "thumbs; hips" (05/14/2017)  . 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 Cerebrovascular disease    Carotid dopplers which showed no significant increase in velocities, extremely minimal on the left, antegrade vertebral bilaterally.  . Cervical spondylosis   . Chronic lower back pain   . COPD (chronic obstructive pulmonary disease) ([place])    "little bit" (05/14/2017)  . Coronary artery disease, non-occlusive    Coronary calcium scoring: June 2018: Score 1106. 98 percentile for age -[place]  . Depression   . Exercise-induced asthma with acute exacerbation    "only in very hot weather" (05/14/2017)  . GERD (gastroesophageal reflux disease)   . Headache(784.0)    "monthly" (  05/14/2017)  . History of IBS    resovlved  . History of kidney stones   . 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 Hyperlipidemia with target LDL less than 70    Mostly statin intolerant,. Currently on Livalo 4 mg  . Hypertension,  benign   . Lumbosacral spondylosis   . Migraine headache    "were monthly; none recently" (05/14/2017)  . PAD (peripheral artery disease) (HCC)    Status post right above-the-knee-below the knee popliteal artery bypass; postop right ABI 0.96.  Marland Kitchen Pneumonia "several times"  . Sleep apnea    does not wear CPAP; "think it was medication related" (05/14/2017)  . Thoracic aortic ectasia (HCC)    ~40 mm on Coronary Calcium Score CT - recommend f/u CTA or MRA  . TIA (transient ischemic attack) 09/2011   "several"    Past Surgical History:  Procedure Laterality Date  . ABDOMINAL AORTOGRAM W/LOWER EXTREMITY N/A 05/14/2017   Procedure: Abdominal Aortogram w/Lower Extremity;  Surgeon: Conrad Bear, MD;  Location: [facility];  Service: Cardiovascular;  Laterality: N/A;  . ACROMIO-CLAVICULAR JOINT REPAIR Left 1990s   "I think it was called an AC joint removal"  . BACK SURGERY    . BYPASS GRAFT POPLITEAL TO POPLITEAL Right 05/15/2017   Procedure: BYPASS GRAFT ABOVE KNEE POPLITEAL TO BELOW KNEE POPLITEAL ARTERY;  Surgeon: Conrad Decatur, MD;  Location: [facility];  Service: Vascular;  Laterality: Right;  . FOOT FRACTURE SURGERY Bilateral    multiple procedures  . FRACTURE SURGERY    . MAXIMUM ACCESS (MAS)POSTERIOR LUMBAR INTERBODY FUSION (PLIF) 1 LEVEL N/A 09/30/2014   Procedure: FOR MAXIMUM ACCESS (MAS) POSTERIOR LUMBAR INTERBODY FUSION (PLIF) 1 LEVEL;  Surgeon: Eustace Moore, MD;  Location: [facility];  Service: Neurosurgery;  Laterality: N/A;  FOR MAXIMUM ACCESS (MAS) POSTERIOR LUMBAR INTERBODY FUSION (PLIF) 1 LEVEL LUMBAR 4-5  . MULTIPLE TOOTH EXTRACTIONS Right 04/2017  . NM MYOVIEW LTD  06/2017    Normal EF, 66%. No ST changes. Normal study. LOW RISK.  Marland Kitchen TRANSTHORACIC ECHOCARDIOGRAM  09/07/2013   Done to evaluate TIA: Normal LV size and function. EF 55-60%. GR 1 DD. Mild left atrial dilation. Mildly calcified mitral leaflets. Otherwise normal.    Coronary calcium scoring: June 2018: Score  1106. 98 percentile for age. Aneurysmal dilation of the ascending aorta measuring 40 mm. Recommend CTA or MRA.  Myoview 07/17/2017: Normal EF, 66%. No ST changes. Normal study. LOW RISK.   Current Meds  Medication Sig  . ALPRAZolam (XANAX) 1 MG tablet Take 1 mg by mouth at bedtime as needed for anxiety.  Marland Kitchen amLODipine (NORVASC) 10 MG tablet Take 10 mg by mouth daily.  . ARIPiprazole (ABILIFY) 2 MG tablet Take 2 mg by mouth daily.  Marland Kitchen aspirin EC 81 MG tablet Take 81 mg by mouth 2 (two) times daily.  . Aspirin-Acetaminophen-Caffeine (EXCEDRIN PO) Take 2 tablets by mouth 2 (two) times daily as needed (pain).  . Coenzyme Q10 (COQ-10) 200 MG CAPS Take 1 capsule by mouth daily.  Marland Kitchen desvenlafaxine (PRISTIQ) 100 MG 24 hr tablet Take 100 mg by mouth daily.  . Evolocumab (REPATHA SURECLICK) 425 MG/ML SOAJ Inject 140 mg into the skin every 14 (fourteen) days.  . fenofibrate (TRICOR) 145 MG tablet Take 145 mg by mouth daily.   Marland Kitchen KRILL OIL PO Take 1 capsule by mouth daily.  Marland Kitchen lisinopril-hydrochlorothiazide (PRINZIDE,ZESTORETIC) 20-12.5 MG per tablet Take 1 tablet by mouth  2 (two) times daily.   . mometasone (NASONEX) 50 MCG/ACT nasal spray Place 2 sprays into the nose daily as needed (allergies).  . Multiple Vitamin (MULTIVITAMIN WITH MINERALS) TABS tablet Take 1 tablet by mouth daily.  . mupirocin ointment (BACTROBAN) 2 % Apply 1 application topically as needed (breakouts).   Marland Kitchen OVER THE COUNTER MEDICATION Take 4 capsules by mouth daily as needed (energy). SeroVital otc supplement  . Pitavastatin Calcium 4 MG TABS Take 4 mg by mouth daily.   . potassium chloride (K-DUR,KLOR-CON) 10 MEQ tablet Take 10 mEq by mouth daily.  Marland Kitchen Propylene Glycol-Glycerin (SOOTHE OP) Apply 1 drop to eye daily as needed (dry eyes).  . traMADol (ULTRAM) 50 MG tablet Take 100 mg by mouth 3 (three) times daily as needed for moderate pain.   Marland Kitchen zolpidem (AMBIEN) 10 MG tablet Take 20 mg by mouth at bedtime.     Allergies  Allergen  Reactions  . Penicillins Swelling    TONGUE SWELLING  PATIENT HAD A PCN REACTION WITH IMMEDIATE RASH, FACIAL/TONGUE/THROAT SWELLING, SOB, OR LIGHTHEADEDNESS WITH HYPOTENSION:  #  #  #  YES  #  #  #   Has patient had a PCN reaction causing severe rash involving mucus membranes or skin necrosis: No Has patient had a PCN reaction that required hospitalization: No Has patient had a PCN reaction occurring within the last 10 years: No   . Lyrica [Pregabalin] Other (See Comments)    DRY MOUTH    Social History   Socioeconomic History  . Marital status: Widowed    Spouse name: [name]   . Number of children: 0  . Years of education: COLLEGE  . Highest education level: None  Social Needs  . Financial resource strain: None  . Food insecurity - worry: None  . Food insecurity - inability: None  . Transportation needs - medical: None  . Transportation needs - non-medical: None  Occupational History  . Occupation: Horse Breeder   Tobacco Use  . Smoking status: Light Tobacco Smoker    Packs/day: 0.02    Years: 36.00    Pack years: 0.72    Types: Cigarettes    Last attempt to quit: 04/24/2017    Years since quitting: 0.7  . Smokeless tobacco: Never Used  Substance and Sexual Activity  . Alcohol use: Yes    Alcohol/week: 1.8 oz    Types: 3 Glasses of wine per week  . Drug use: No  . Sexual activity: No  Other Topics Concern  . None  Social History Narrative   Patient lives at home with her husband [name]. Patient has no children.    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    Patient has BA degree.    Smokes 2-3 cigarettes /day -- former heavy smoker (failed "quitting") -- now says she has truly "QUIT" - however now is been going back and forth between quitting and cheating and probably smokes anywhere from 0-3 cigarettes a day.    family history includes Heart attack in her mother; Stroke in her father and mother.  Wt Readings from Last 3 Encounters:  01/21/18  161 lb (73 kg)  07/25/17 153 lb (69.4 kg)  07/17/17 147 lb (66.7 kg)    PHYSICAL EXAM BP 138/76   Pulse 86   Ht 5\' 5"  (1.651 m)   Wt 161 lb (73 kg)   BMI 26.79 kg/m  Physical Exam  Constitutional: She is oriented to person, place, and  time. She appears well-developed and well-nourished.  HENT:  Head: Normocephalic and atraumatic.  Neck: No hepatojugular reflux and no JVD present. Carotid bruit is not present.  Cardiovascular: Normal rate, regular rhythm, S1 normal and S2 normal.  No extrasystoles are present. PMI is not displaced. Exam reveals no gallop, no distant heart sounds, no friction rub, no midsystolic click and no opening snap.  Murmur (1/2 SEM @ R&LUSB) heard.  Harsh crescendo-decrescendo midsystolic murmur is present with a grade of 1/6 at the upper right sternal border and upper left sternal border radiating to the neck. Pulses:      Carotid pulses are 2+ on the right side, and 2+ on the left side.      Dorsalis pedis pulses are 2+ on the right side, and 1+ on the left side.       Posterior tibial pulses are 2+ on the right side, and 1+ on the left side.  Pulmonary/Chest: Effort normal and breath sounds normal. No respiratory distress. She has no wheezes.  Abdominal: Soft. Bowel sounds are normal. She exhibits no distension. There is no tenderness. There is no rebound.  Musculoskeletal: Normal range of motion. She exhibits no edema.  Neurological: She is alert and oriented to person, place, and time.  Psychiatric: She has a normal mood and affect. Her behavior is normal. Judgment and thought content normal.  Nursing note and vitals reviewed.   Adult ECG Report N/a   Other studies Reviewed: Additional studies/ records that were reviewed today include:  Recent Labs:    - Now on Repatha - was checked by PCP. See assessment and plan for details   ASSESSMENT / PLAN: Problem List Items Addressed This Visit    Cerebrovascular disease (Chronic)    history of TIAs in  the past - following carotid Dopplers      Relevant Medications   amLODipine (NORVASC) 10 MG tablet   Coronary artery disease, non-occlusive (Chronic)    Elevated cardiac calcium score indicates that she does have a high burden of coronary disease, but not obstructed by Myoview stress test. Not actively having symptoms. Continue aggressive risk modification: Is hoping to start exercising, she is working on slowly quitting cigarettes, blood pressure is borderline, but stable. Lipids look much improved on Repatha.      Relevant Medications   amLODipine (NORVASC) 10 MG tablet   Essential hypertension (Chronic)    Blood pressure is borderline today. On initial check is 140/93 with a heart rate of 106. On my recheck was in the 130s with heart rate in the 80s. Currently on maxed out doses of amlodipine and lisinopril and HCTZ. Next up would probably be increasing the HCTZ component of her lisinopril and HCTZ, but I suspect that we may have to add another medicine. We'll ask her PCP to follow.      Relevant Medications   amLODipine (NORVASC) 10 MG tablet   Hyperlipidemia with target LDL less than 70 - Primary (Chronic)    Remains on Livalo fenofibrate alone Repatha. Labs look great. Total cholesterol was 141, triggers is 144, LDL 45 and HDL 67. Outstanding results.  Continue to follow-up with CV RR team      Relevant Medications   amLODipine (NORVASC) 10 MG tablet   PAD (peripheral artery disease) (HCC) (Chronic)    Followed by Dr. Bridgett Larsson from vascular surgery. No active claudication following her right femoropopliteal bypass. Aggressive respiratory dictation.      Relevant Medications   amLODipine (NORVASC) 10 MG tablet  Thoracic aortic ectasia (HCC) (Chronic)    Will check a chest CTA after follow-up visit in October      Relevant Medications   amLODipine (NORVASC) 10 MG tablet      Current medicines are reviewed at length with the patient today. (+/- concerns) n/a The  following changes have been made:increase Amlodipine to 10 mg  Patient Instructions  No medication changes     CONGRATULATIONS ON CHOLESTEROL LEVELS- KEEP USING THE [place]      Your physician wants you to follow-up in OCT 2019 WITH DR [place].You will receive a reminder letter in the mail two months in advance. If you don't receive a letter, please call our office to schedule the follow-up appointment.    If you need a refill on your cardiac medications before your next appointment, please call your pharmacy.    Studies Ordered:   No orders of the defined types were placed in this encounter.     Glenetta Hew, M.D., M.S. Interventional Cardiologist   Pager # [phone] Phone # [phone] [address] [place], [place] [address]

## 2018-01-21 NOTE — Assessment & Plan Note (Signed)
Will check a chest CTA after follow-up visit in October

## 2018-01-21 NOTE — Patient Instructions (Signed)
No medication changes     CONGRATULATIONS ON CHOLESTEROL LEVELS- KEEP USING THE REPATHA      Your physician wants you to follow-up in OCT 2019 WITH DR [place].You will receive a reminder letter in the mail two months in advance. If you don't receive a letter, please call our office to schedule the follow-up appointment.    If you need a refill on your cardiac medications before your next appointment, please call your pharmacy.

## 2018-01-22 ENCOUNTER — Encounter: Payer: Self-pay | Admitting: Cardiology

## 2018-03-06 DIAGNOSIS — G4730 Sleep apnea, unspecified: Secondary | ICD-10-CM | POA: Diagnosis not present

## 2018-03-06 DIAGNOSIS — G4709 Other insomnia: Secondary | ICD-10-CM | POA: Diagnosis not present

## 2018-03-06 DIAGNOSIS — F341 Dysthymic disorder: Secondary | ICD-10-CM | POA: Diagnosis not present

## 2018-03-06 DIAGNOSIS — F4312 Post-traumatic stress disorder, chronic: Secondary | ICD-10-CM | POA: Diagnosis not present

## 2018-03-06 DIAGNOSIS — F39 Unspecified mood [affective] disorder: Secondary | ICD-10-CM | POA: Diagnosis not present

## 2018-03-06 DIAGNOSIS — Z634 Disappearance and death of family member: Secondary | ICD-10-CM | POA: Diagnosis not present

## 2018-04-03 ENCOUNTER — Ambulatory Visit
Admission: RE | Admit: 2018-04-03 | Discharge: 2018-04-03 | Disposition: A | Discharge status: Home / Self Care | Payer: Medicare Other | Source: Ambulatory Visit | Attending: Physician Assistant | Admitting: Physician Assistant

## 2018-04-03 ENCOUNTER — Other Ambulatory Visit: Payer: Self-pay | Admitting: Physician Assistant

## 2018-04-03 DIAGNOSIS — E782 Mixed hyperlipidemia: Secondary | ICD-10-CM | POA: Diagnosis not present

## 2018-04-03 DIAGNOSIS — I10 Essential (primary) hypertension: Secondary | ICD-10-CM | POA: Diagnosis not present

## 2018-04-03 DIAGNOSIS — M545 Low back pain: Principal | ICD-10-CM

## 2018-04-03 DIAGNOSIS — M5137 Other intervertebral disc degeneration, lumbosacral region: Secondary | ICD-10-CM | POA: Diagnosis not present

## 2018-04-03 DIAGNOSIS — M48061 Spinal stenosis, lumbar region without neurogenic claudication: Secondary | ICD-10-CM | POA: Diagnosis not present

## 2018-04-03 DIAGNOSIS — G894 Chronic pain syndrome: Secondary | ICD-10-CM | POA: Diagnosis not present

## 2018-04-03 DIAGNOSIS — F329 Major depressive disorder, single episode, unspecified: Secondary | ICD-10-CM | POA: Diagnosis not present

## 2018-04-03 DIAGNOSIS — Z79891 Long term (current) use of opiate analgesic: Secondary | ICD-10-CM | POA: Diagnosis not present

## 2018-04-03 DIAGNOSIS — G8929 Other chronic pain: Secondary | ICD-10-CM

## 2018-04-03 DIAGNOSIS — M79606 Pain in leg, unspecified: Secondary | ICD-10-CM | POA: Diagnosis not present

## 2018-04-03 DIAGNOSIS — Z79899 Other long term (current) drug therapy: Secondary | ICD-10-CM | POA: Diagnosis not present

## 2018-04-03 DIAGNOSIS — M961 Postlaminectomy syndrome, not elsewhere classified: Secondary | ICD-10-CM | POA: Diagnosis not present

## 2018-04-10 DIAGNOSIS — M47817 Spondylosis without myelopathy or radiculopathy, lumbosacral region: Secondary | ICD-10-CM | POA: Diagnosis not present

## 2018-04-24 DIAGNOSIS — M7918 Myalgia, other site: Secondary | ICD-10-CM | POA: Diagnosis not present

## 2018-04-24 DIAGNOSIS — G039 Meningitis, unspecified: Secondary | ICD-10-CM | POA: Diagnosis not present

## 2018-04-24 DIAGNOSIS — G894 Chronic pain syndrome: Secondary | ICD-10-CM | POA: Diagnosis not present

## 2018-04-24 DIAGNOSIS — M47817 Spondylosis without myelopathy or radiculopathy, lumbosacral region: Secondary | ICD-10-CM | POA: Diagnosis not present

## 2018-04-28 ENCOUNTER — Inpatient Hospital Stay (HOSPITAL_COMMUNITY)
Admission: EM | Admit: 2018-04-28 | Discharge: 2018-04-30 | DRG: 377 | Disposition: A | Discharge status: Home / Self Care | Payer: Medicare Other | Attending: Internal Medicine | Admitting: Internal Medicine

## 2018-04-28 ENCOUNTER — Emergency Department (HOSPITAL_COMMUNITY): Payer: Medicare Other

## 2018-04-28 ENCOUNTER — Encounter (HOSPITAL_COMMUNITY): Payer: Self-pay | Admitting: Emergency Medicine

## 2018-04-28 DIAGNOSIS — R0602 Shortness of breath: Secondary | ICD-10-CM | POA: Diagnosis not present

## 2018-04-28 DIAGNOSIS — K254 Chronic or unspecified gastric ulcer with hemorrhage: Principal | ICD-10-CM | POA: Diagnosis present

## 2018-04-28 DIAGNOSIS — K449 Diaphragmatic hernia without obstruction or gangrene: Secondary | ICD-10-CM | POA: Diagnosis present

## 2018-04-28 DIAGNOSIS — F32A Depression, unspecified: Secondary | ICD-10-CM | POA: Diagnosis present

## 2018-04-28 DIAGNOSIS — R1310 Dysphagia, unspecified: Secondary | ICD-10-CM | POA: Diagnosis present

## 2018-04-28 DIAGNOSIS — F17210 Nicotine dependence, cigarettes, uncomplicated: Secondary | ICD-10-CM | POA: Diagnosis present

## 2018-04-28 DIAGNOSIS — D62 Acute posthemorrhagic anemia: Secondary | ICD-10-CM | POA: Diagnosis not present

## 2018-04-28 DIAGNOSIS — J441 Chronic obstructive pulmonary disease with (acute) exacerbation: Secondary | ICD-10-CM | POA: Diagnosis present

## 2018-04-28 DIAGNOSIS — I2510 Atherosclerotic heart disease of native coronary artery without angina pectoris: Secondary | ICD-10-CM | POA: Diagnosis present

## 2018-04-28 DIAGNOSIS — I10 Essential (primary) hypertension: Secondary | ICD-10-CM | POA: Diagnosis present

## 2018-04-28 DIAGNOSIS — I739 Peripheral vascular disease, unspecified: Secondary | ICD-10-CM | POA: Diagnosis present

## 2018-04-28 DIAGNOSIS — J9601 Acute respiratory failure with hypoxia: Secondary | ICD-10-CM | POA: Diagnosis not present

## 2018-04-28 DIAGNOSIS — M19042 Primary osteoarthritis, left hand: Secondary | ICD-10-CM | POA: Diagnosis present

## 2018-04-28 DIAGNOSIS — E785 Hyperlipidemia, unspecified: Secondary | ICD-10-CM | POA: Diagnosis present

## 2018-04-28 DIAGNOSIS — Z823 Family history of stroke: Secondary | ICD-10-CM

## 2018-04-28 DIAGNOSIS — M19041 Primary osteoarthritis, right hand: Secondary | ICD-10-CM | POA: Diagnosis present

## 2018-04-28 DIAGNOSIS — K589 Irritable bowel syndrome without diarrhea: Secondary | ICD-10-CM | POA: Diagnosis present

## 2018-04-28 DIAGNOSIS — Z8673 Personal history of transient ischemic attack (TIA), and cerebral infarction without residual deficits: Secondary | ICD-10-CM

## 2018-04-28 DIAGNOSIS — R531 Weakness: Secondary | ICD-10-CM | POA: Diagnosis not present

## 2018-04-28 DIAGNOSIS — F419 Anxiety disorder, unspecified: Secondary | ICD-10-CM | POA: Diagnosis present

## 2018-04-28 DIAGNOSIS — T39315A Adverse effect of propionic acid derivatives, initial encounter: Secondary | ICD-10-CM | POA: Diagnosis present

## 2018-04-28 DIAGNOSIS — E876 Hypokalemia: Secondary | ICD-10-CM | POA: Diagnosis present

## 2018-04-28 DIAGNOSIS — N179 Acute kidney failure, unspecified: Secondary | ICD-10-CM | POA: Diagnosis present

## 2018-04-28 DIAGNOSIS — F329 Major depressive disorder, single episode, unspecified: Secondary | ICD-10-CM | POA: Diagnosis present

## 2018-04-28 DIAGNOSIS — Z8249 Family history of ischemic heart disease and other diseases of the circulatory system: Secondary | ICD-10-CM

## 2018-04-28 DIAGNOSIS — K219 Gastro-esophageal reflux disease without esophagitis: Secondary | ICD-10-CM | POA: Diagnosis present

## 2018-04-28 DIAGNOSIS — N170 Acute kidney failure with tubular necrosis: Secondary | ICD-10-CM | POA: Diagnosis not present

## 2018-04-28 DIAGNOSIS — Z888 Allergy status to other drugs, medicaments and biological substances status: Secondary | ICD-10-CM

## 2018-04-28 DIAGNOSIS — E860 Dehydration: Secondary | ICD-10-CM | POA: Diagnosis present

## 2018-04-28 DIAGNOSIS — G459 Transient cerebral ischemic attack, unspecified: Secondary | ICD-10-CM | POA: Diagnosis not present

## 2018-04-28 DIAGNOSIS — M160 Bilateral primary osteoarthritis of hip: Secondary | ICD-10-CM | POA: Diagnosis present

## 2018-04-28 DIAGNOSIS — Z981 Arthrodesis status: Secondary | ICD-10-CM

## 2018-04-28 DIAGNOSIS — Z95820 Peripheral vascular angioplasty status with implants and grafts: Secondary | ICD-10-CM

## 2018-04-28 DIAGNOSIS — Z880 Allergy status to penicillin: Secondary | ICD-10-CM

## 2018-04-28 DIAGNOSIS — K920 Hematemesis: Secondary | ICD-10-CM | POA: Diagnosis not present

## 2018-04-28 DIAGNOSIS — A419 Sepsis, unspecified organism: Secondary | ICD-10-CM | POA: Diagnosis present

## 2018-04-28 DIAGNOSIS — Z720 Tobacco use: Secondary | ICD-10-CM | POA: Diagnosis present

## 2018-04-28 DIAGNOSIS — I959 Hypotension, unspecified: Secondary | ICD-10-CM | POA: Diagnosis present

## 2018-04-28 DIAGNOSIS — R931 Abnormal findings on diagnostic imaging of heart and coronary circulation: Secondary | ICD-10-CM | POA: Diagnosis present

## 2018-04-28 DIAGNOSIS — R5383 Other fatigue: Secondary | ICD-10-CM | POA: Diagnosis not present

## 2018-04-28 DIAGNOSIS — Z87442 Personal history of urinary calculi: Secondary | ICD-10-CM

## 2018-04-28 DIAGNOSIS — J45990 Exercise induced bronchospasm: Secondary | ICD-10-CM | POA: Diagnosis present

## 2018-04-28 DIAGNOSIS — T39015A Adverse effect of aspirin, initial encounter: Secondary | ICD-10-CM | POA: Diagnosis present

## 2018-04-28 DIAGNOSIS — Z7982 Long term (current) use of aspirin: Secondary | ICD-10-CM

## 2018-04-28 LAB — I-STAT CHEM 8, ED
BUN: 40 mg/dL — ABNORMAL HIGH (ref 6–20)
Calcium, Ion: 1.13 mmol/L — ABNORMAL LOW (ref 1.15–1.40)
Chloride: 101 mmol/L (ref 101–111)
Creatinine, Ser: 1.6 mg/dL — ABNORMAL HIGH (ref 0.44–1.00)
Glucose, Bld: 124 mg/dL — ABNORMAL HIGH (ref 65–99)
HCT: 39 % (ref 36.0–46.0)
Hemoglobin: 13.3 g/dL (ref 12.0–15.0)
Potassium: 3 mmol/L — ABNORMAL LOW (ref 3.5–5.1)
Sodium: 139 mmol/L (ref 135–145)
TCO2: 23 mmol/L (ref 22–32)

## 2018-04-28 LAB — COMPREHENSIVE METABOLIC PANEL
ALT: 21 U/L (ref 14–54)
AST: 28 U/L (ref 15–41)
Albumin: 3.8 g/dL (ref 3.5–5.0)
Alkaline Phosphatase: 46 U/L (ref 38–126)
Anion gap: 14 (ref 5–15)
BUN: 41 mg/dL — ABNORMAL HIGH (ref 6–20)
CO2: 22 mmol/L (ref 22–32)
Calcium: 9.5 mg/dL (ref 8.9–10.3)
Chloride: 100 mmol/L — ABNORMAL LOW (ref 101–111)
Creatinine, Ser: 1.64 mg/dL — ABNORMAL HIGH (ref 0.44–1.00)
GFR calc Af Amer: 36 mL/min — ABNORMAL LOW (ref >60)
GFR calc non Af Amer: 31 mL/min — ABNORMAL LOW (ref >60)
Glucose, Bld: 139 mg/dL — ABNORMAL HIGH (ref 65–99)
Potassium: 2.8 mmol/L — ABNORMAL LOW (ref 3.5–5.1)
Sodium: 136 mmol/L (ref 135–145)
Total Bilirubin: 0.6 mg/dL (ref 0.3–1.2)
Total Protein: 6.1 g/dL — ABNORMAL LOW (ref 6.5–8.1)

## 2018-04-28 LAB — POC OCCULT BLOOD, ED: Fecal Occult Bld: NEGATIVE

## 2018-04-28 LAB — CBC
HCT: 37.2 % (ref 36.0–46.0)
Hemoglobin: 12.9 g/dL (ref 12.0–15.0)
MCH: 32 pg (ref 26.0–34.0)
MCHC: 34.7 g/dL (ref 30.0–36.0)
MCV: 92.3 fL (ref 78.0–100.0)
Platelets: 406 10*3/uL — ABNORMAL HIGH (ref 150–400)
RBC: 4.03 MIL/uL (ref 3.87–5.11)
RDW: 13.1 % (ref 11.5–15.5)
WBC: 15.9 10*3/uL — ABNORMAL HIGH (ref 4.0–10.5)

## 2018-04-28 LAB — LIPASE, BLOOD: Lipase: 32 U/L (ref 11–51)

## 2018-04-28 LAB — I-STAT CG4 LACTIC ACID, ED: Lactic Acid, Venous: 2.33 mmol/L (ref 0.5–1.9)

## 2018-04-28 LAB — TYPE AND SCREEN
ABO/RH(D): A POS
Antibody Screen: NEGATIVE

## 2018-04-28 LAB — TROPONIN I: Troponin I: <0.03 ng/mL (ref <0.03)

## 2018-04-28 LAB — PROTIME-INR
INR: 1.02
Prothrombin Time: 13.3 seconds (ref 11.4–15.2)

## 2018-04-28 MED ORDER — SODIUM CHLORIDE 0.9 % IV SOLN
INTRAVENOUS | Status: DC
  Administered 2018-04-29: 01:00:00 via INTRAVENOUS

## 2018-04-28 MED ORDER — ONDANSETRON HCL 4 MG/2ML IJ SOLN
4.0000 mg | Freq: Once | INTRAMUSCULAR | Status: AC
  Administered 2018-04-29: 4 mg via INTRAVENOUS
  Filled 2018-04-28: qty 2

## 2018-04-28 MED ORDER — SODIUM CHLORIDE 0.9 % IV SOLN
80.0000 mg | Freq: Once | INTRAVENOUS | Status: AC
  Administered 2018-04-29: 80 mg via INTRAVENOUS
  Filled 2018-04-28: qty 80

## 2018-04-28 MED ORDER — SODIUM CHLORIDE 0.9 % IV SOLN
8.0000 mg/h | INTRAVENOUS | Status: DC
  Administered 2018-04-29 (×2): 8 mg/h via INTRAVENOUS
  Filled 2018-04-28 (×4): qty 80

## 2018-04-28 MED ORDER — SODIUM CHLORIDE 0.9 % IV BOLUS
1000.0000 mL | Freq: Once | INTRAVENOUS | Status: AC
  Administered 2018-04-29: 1000 mL via INTRAVENOUS

## 2018-04-28 NOTE — ED Triage Notes (Signed)
Patient presents to ED for assessment of 3 episodes of bloody emesis x 1 hour with increasing chronic back pain.  SpO2 86% RA in triage, BP 80/0.  Patient c/o shortness of breath, denies CP

## 2018-04-28 NOTE — ED Provider Notes (Signed)
[facility] EMERGENCY DEPARTMENT Provider Note   CSN: [id number] Arrival date & time: 04/28/18  2231     History   Chief Complaint Chief Complaint  Patient presents with  . Rectal Bleeding    HPI Brenda Carney is a 67 y.o. female.  Patient presents with 3 episodes of bloody emesis onset about 1 hour ago while she was waiting for her dog's office.  States she had generalized weakness and dizziness for the past week or so.  She has chronic back pain for which she takes tramadol and aspirin.  She denies any previous GI bleeding history.  She does not take any blood thinners other than aspirin.  She denies any significant alcohol use.  She is an EGD in the past for dilations but never told that she had ulcers or reflux.  She complains of shortness of breath, generalized weakness and diffuse abdominal pain with nausea.  Denies chest pain.  Denies cough or fever. Patient had arterial bypass in R leg last year.  Patient denies any dark or bloody stools.   The history is provided by the patient.  Rectal Bleeding  Associated symptoms: abdominal pain, dizziness and light-headedness   Associated symptoms: no fever     Past Medical History:  Diagnosis Date  . Anemia    as a younger woman  . Arthritis    "thumbs; hips" (05/14/2017)  . 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 Cerebrovascular disease    Carotid dopplers which showed no significant increase in velocities, extremely minimal on the left, antegrade vertebral bilaterally.  . Cervical spondylosis   . Chronic lower back pain   . COPD (chronic obstructive pulmonary disease) ([place])    "little bit" (05/14/2017)  . Coronary artery disease, non-occlusive    Coronary calcium scoring: June 2018: Score 1106. 98 percentile for age -[place]  . Depression   . Exercise-induced asthma with acute exacerbation    "only in very hot weather" (05/14/2017)    . GERD (gastroesophageal reflux disease)   . HCWCBJSE(831.5)    "monthly" (05/14/2017)  . History of IBS    resovlved  . History of kidney stones   . 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 Hyperlipidemia with target LDL less than 70    Mostly statin intolerant,. Currently on Livalo 4 mg  . Hypertension, benign   . Lumbosacral spondylosis   . Migraine headache    "were monthly; none recently" (05/14/2017)  . PAD (peripheral artery disease) (HCC)    Status post right above-the-knee-below the knee popliteal artery bypass; postop right ABI 0.96.  Marland Kitchen Pneumonia "several times"  . Sleep apnea    does not wear CPAP; "think it was medication related" (05/14/2017)  . Thoracic aortic ectasia (HCC)    ~40 mm on Coronary Calcium Score CT - recommend f/u CTA or MRA  . TIA (transient ischemic attack) 09/2011   "several"    Patient Active Problem List   Diagnosis Date Noted  . Thoracic aortic ectasia ([place])   . Sleep apnea   . Pneumonia   . Migraine headache   . Lumbosacral spondylosis   . Hx of multiple concussions   . History of kidney stones   . History of IBS   . GERD (gastroesophageal reflux disease)   . Exercise-induced asthma with acute exacerbation   . Depression   . COPD (chronic obstructive pulmonary  disease) ([place])   . Chronic lower back pain   . Cervical spondylosis   . Cerebral aneurysm   . Arthritis   . Anemia   . Coronary artery disease, non-occlusive 06/28/2017  . Critical lower limb ischemia 05/14/2017  . Preoperative cardiovascular examination   . PAD (peripheral artery disease) ([place])   . S/P lumbar spinal fusion 09/30/2014  . Essential hypertension   . Hyperlipidemia with target LDL less than 70   . Cerebrovascular disease   . Headache(784.0) 10/07/2013  . TIA (transient ischemic attack) 09/05/2013  . Tobacco abuse 09/05/2013    Past Surgical History:  Procedure Laterality Date  . ABDOMINAL AORTOGRAM W/LOWER EXTREMITY N/A 05/14/2017    Procedure: Abdominal Aortogram w/Lower Extremity;  Surgeon: Conrad [address], MD;  Location: [facility];  Service: Cardiovascular;  Laterality: N/A;  . ACROMIO-CLAVICULAR JOINT REPAIR Left 1990s   "I think it was called an AC joint removal"  . BACK SURGERY    . BYPASS GRAFT POPLITEAL TO POPLITEAL Right 05/15/2017   Procedure: BYPASS GRAFT ABOVE KNEE POPLITEAL TO BELOW KNEE POPLITEAL ARTERY;  Surgeon: Conrad Landa, MD;  Location: [facility];  Service: Vascular;  Laterality: Right;  . FOOT FRACTURE SURGERY Bilateral    multiple procedures  . FRACTURE SURGERY    . MAXIMUM ACCESS (MAS)POSTERIOR LUMBAR INTERBODY FUSION (PLIF) 1 LEVEL N/A 09/30/2014   Procedure: FOR MAXIMUM ACCESS (MAS) POSTERIOR LUMBAR INTERBODY FUSION (PLIF) 1 LEVEL;  Surgeon: Eustace Moore, MD;  Location: [facility];  Service: Neurosurgery;  Laterality: N/A;  FOR MAXIMUM ACCESS (MAS) POSTERIOR LUMBAR INTERBODY FUSION (PLIF) 1 LEVEL LUMBAR 4-5  . MULTIPLE TOOTH EXTRACTIONS Right 04/2017  . NM MYOVIEW LTD  06/2017    Normal EF, 66%. No ST changes. Normal study. LOW RISK.  Marland Kitchen TRANSTHORACIC ECHOCARDIOGRAM  09/07/2013   Done to evaluate TIA: Normal LV size and function. EF 55-60%. GR 1 DD. Mild left atrial dilation. Mildly calcified mitral leaflets. Otherwise normal.     OB History   None      Home Medications    Prior to Admission medications   Medication Sig Start Date End Date Taking? Authorizing Provider  ALPRAZolam Duanne Moron) 1 MG tablet Take 1 mg by mouth at bedtime as needed for anxiety.    [provider]  amLODipine (NORVASC) 10 MG tablet Take 10 mg by mouth daily.    [provider]  ARIPiprazole (ABILIFY) 2 MG tablet Take 2 mg by mouth daily.    [provider]  aspirin EC 81 MG tablet Take 81 mg by mouth 2 (two) times daily.    [provider]  Aspirin-Acetaminophen-Caffeine (EXCEDRIN PO) Take 2 tablets by mouth 2 (two) times daily as needed (pain).    [provider]    Coenzyme Q10 (COQ-10) 200 MG CAPS Take 1 capsule by mouth daily.    [provider]  desvenlafaxine (PRISTIQ) 100 MG 24 hr tablet Take 100 mg by mouth daily.    [provider]  Evolocumab (REPATHA SURECLICK) 254 MG/ML SOAJ Inject 140 mg into the skin every 14 (fourteen) days. 06/07/17   Leonie Man, MD  fenofibrate (TRICOR) 145 MG tablet Take 145 mg by mouth daily.  08/23/15   [provider]  KRILL OIL PO Take 1 capsule by mouth daily.    [provider]  lisinopril-hydrochlorothiazide (PRINZIDE,ZESTORETIC) 20-12.5 MG per tablet Take 1 tablet by mouth 2 (two) times daily.  11/23/14   [provider]  mometasone (  NASONEX) 50 MCG/ACT nasal spray Place 2 sprays into the nose daily as needed (allergies).    [provider]  Multiple Vitamin (MULTIVITAMIN WITH MINERALS) TABS tablet Take 1 tablet by mouth daily.    [provider]  mupirocin ointment (BACTROBAN) 2 % Apply 1 application topically as needed (breakouts).  10/05/15   [provider]  OVER THE COUNTER MEDICATION Take 4 capsules by mouth daily as needed (energy). SeroVital otc supplement    [provider]  Pitavastatin Calcium 4 MG TABS Take 4 mg by mouth daily.  06/07/17   Leonie Man, MD  potassium chloride (K-DUR,KLOR-CON) 10 MEQ tablet Take 10 mEq by mouth daily.    [provider]  Propylene Glycol-Glycerin (SOOTHE OP) Apply 1 drop to eye daily as needed (dry eyes).    [provider]  traMADol (ULTRAM) 50 MG tablet Take 100 mg by mouth 3 (three) times daily as needed for moderate pain.     [provider]  zolpidem (AMBIEN) 10 MG tablet Take 20 mg by mouth at bedtime.     [provider]    Family History Family History  Problem Relation Age of Onset  . Stroke Father   . Heart attack Mother   . Stroke Mother     Social History Social History   Tobacco Use  . Smoking status: Light Tobacco Smoker     Packs/day: 0.02    Years: 36.00    Pack years: 0.72    Types: Cigarettes    Last attempt to quit: 04/24/2017    Years since quitting: 1.0  . Smokeless tobacco: Never Used  Substance Use Topics  . Alcohol use: Yes    Alcohol/week: 1.8 oz    Types: 3 Glasses of wine per week  . Drug use: No     Allergies   Penicillins and Lyrica [pregabalin]   Review of Systems Review of Systems  Constitutional: Positive for activity change, appetite change and fatigue. Negative for fever.  HENT: Negative for congestion.   Respiratory: Positive for shortness of breath. Negative for chest tightness.   Cardiovascular: Negative for chest pain.  Gastrointestinal: Positive for abdominal pain, hematochezia and nausea. Negative for diarrhea.  Genitourinary: Negative for dysuria, hematuria, vaginal bleeding and vaginal discharge.  Musculoskeletal: Positive for arthralgias, back pain and myalgias.  Neurological: Positive for dizziness, weakness and light-headedness.   all other systems are negative except as noted in the HPI and PMH.     Physical Exam Updated Vital Signs BP (!) 87/54 (BP Location: Right Arm)   Pulse 76   Temp 98.6 F (37 C) (Oral)   Resp 16   SpO2 91%   Physical Exam  Constitutional: She is oriented to person, place, and time. She appears well-developed and well-nourished. No distress.  Pale appearing  HENT:  Head: Normocephalic and atraumatic.  Mouth/Throat: Oropharynx is clear and moist. No oropharyngeal exudate.  Eyes: Pupils are equal, round, and reactive to light. Conjunctivae and EOM are normal.  Neck: Normal range of motion. Neck supple.  No meningismus.  Cardiovascular: Normal rate, regular rhythm, normal heart sounds and intact distal pulses.  No murmur heard. Pulmonary/Chest: Effort normal and breath sounds normal. No respiratory distress. She exhibits no tenderness.  Hypoxic on arrival and complaining of shortness of breath but lungs are clear  Abdominal: Soft.  There is tenderness. There is no rebound and no guarding.  Mild diffuse tenderness without guarding or rebound  Genitourinary:  Genitourinary Comments: Chaperone present, no  hemorrhoids or fissures no gross blood  Musculoskeletal: Normal range of motion. She exhibits no edema or tenderness.  Palpable DP and PT pulses bilaterally  Neurological: She is alert and oriented to person, place, and time. No cranial nerve deficit. She exhibits normal muscle tone. Coordination normal.  No ataxia on finger to nose bilaterally. No pronator drift. 5/5 strength throughout. CN 2-12 intact.Equal grip strength. Sensation intact.   Skin: Skin is warm. Capillary refill takes less than 2 seconds. No rash noted.  Psychiatric: She has a normal mood and affect. Her behavior is normal.  Nursing note and vitals reviewed.    ED Treatments / Results  Labs (all labs ordered are listed, but only abnormal results are displayed) Labs Reviewed  COMPREHENSIVE METABOLIC PANEL - Abnormal; Notable for the following components:      Result Value   Potassium 2.8 (*)    Chloride 100 (*)    Glucose, Bld 139 (*)    BUN 41 (*)    Creatinine, Ser 1.64 (*)    Total Protein 6.1 (*)    GFR calc non Af Amer 31 (*)    GFR calc Af Amer 36 (*)    All other components within normal limits  CBC - Abnormal; Notable for the following components:   WBC 15.9 (*)    Platelets 406 (*)    All other components within normal limits  MAGNESIUM - Abnormal; Notable for the following components:   Magnesium 1.5 (*)    All other components within normal limits  CBC - Abnormal; Notable for the following components:   WBC 12.3 (*)    RBC 3.58 (*)    Hemoglobin 11.3 (*)    HCT 33.4 (*)    All other components within normal limits  I-STAT CHEM 8, ED - Abnormal; Notable for the following components:   Potassium 3.0 (*)    BUN 40 (*)    Creatinine, Ser 1.60 (*)    Glucose, Bld 124 (*)    Calcium, Ion 1.13 (*)    All other components within  normal limits  I-STAT CG4 LACTIC ACID, ED - Abnormal; Notable for the following components:   Lactic Acid, Venous 2.33 (*)    All other components within normal limits  CULTURE, BLOOD (ROUTINE X 2)  CULTURE, BLOOD (ROUTINE X 2)  CULTURE, EXPECTORATED SPUTUM-ASSESSMENT  GRAM STAIN  PROTIME-INR  LIPASE, BLOOD  TROPONIN I  BRAIN NATRIURETIC PEPTIDE  LACTIC ACID, PLASMA  APTT  INFLUENZA PANEL BY PCR (TYPE A & B)  CBC  CBC  CBC  CREATININE, URINE, RANDOM  UREA NITROGEN, URINE  URINALYSIS, ROUTINE W REFLEX MICROSCOPIC  LACTIC ACID, PLASMA  PROCALCITONIN  BASIC METABOLIC PANEL  STREP PNEUMONIAE URINARY ANTIGEN  POC OCCULT BLOOD, ED  TYPE AND SCREEN    EKG EKG Interpretation  Date/Time:  Sunday Apr 28 2018 23:19:38 EDT Ventricular Rate:  106 PR Interval:    QRS Duration: 84 QT Interval:  342 QTC Calculation: 455 R Axis:   72 Text Interpretation:  Sinus tachycardia Anterior infarct, old No significant change was found Confirmed by Ezequiel Essex [phone]) on 04/28/2018 11:25:07 PM   Radiology Dg Chest Portable 1 View  Result Date: 04/28/2018 CLINICAL DATA:  67 year old female with shortness of breath. EXAM: PORTABLE CHEST 1 VIEW COMPARISON:  Chest CT dated 06/12/2017 FINDINGS: There is emphysematous changes of the lungs. No focal consolidation, pleural effusion, or pneumothorax. The cardiac silhouette is within normal limits. No acute osseous pathology. IMPRESSION: 1. No acute cardiopulmonary  process. 2. COPD. Electronically Signed   By: Anner Crete M.D.   On: 04/28/2018 23:52    Procedures Procedures (including critical care time)  Medications Ordered in ED Medications  sodium chloride 0.9 % bolus 1,000 mL (has no administration in time range)    And  0.9 %  sodium chloride infusion (has no administration in time range)  pantoprazole (PROTONIX) 80 mg in sodium chloride 0.9 % 100 mL IVPB (has no administration in time range)  pantoprazole (PROTONIX) 80 mg in sodium  chloride 0.9 % 250 mL (0.32 mg/mL) infusion (has no administration in time range)  ondansetron (ZOFRAN) injection 4 mg (has no administration in time range)     Initial Impression / Assessment and Plan / ED Course  I have reviewed the triage vital signs and the nursing notes.  Pertinent labs & imaging results that were available during my care of the patient were reviewed by me and considered in my medical decision making (see chart for details).    Patient with 3 episodes of hematemesis today along with nausea.  She has chronic back pain for which she takes Aleve and Ultram.  Denies any blood in her stool.  Found to be hypoxic and hypotensive on arrival.  She does have COPD does not wear oxygen at home.  No chest pain.  Patient given IV fluids, labs are obtained.  Hemoccult is negative.  IV Protonix started.  Chest x-ray is negative.  Patient is not wheezing.  Oxygenation has improved on supplemental oxygen.  Suspect likely erosive esophagitis or peptic ulcer disease due to NSAID use.  Given her report of 3 episodes of frank hematemesis we will plan observation admission.  Patient has an EGD in the past for dilation by her report.  Blood pressure and mental status remained stable throughout ED course.  IV PPI gtt. Continued Admission discussed with Dr. Blaine Hamper.  CRITICAL CARE Performed by: Ezequiel Essex Total critical care time: 35 minutes Critical care time was exclusive of separately billable procedures and treating other patients. Critical care was necessary to treat or prevent imminent or life-threatening deterioration. Critical care was time spent personally by me on the following activities: development of treatment plan with patient and/or surrogate as well as nursing, discussions with consultants, evaluation of patient's response to treatment, examination of patient, obtaining history from patient or surrogate, ordering and performing treatments and interventions, ordering and  review of laboratory studies, ordering and review of radiographic studies, pulse oximetry and re-evaluation of patient's condition.   Final Clinical Impressions(s) / ED Diagnoses   Final diagnoses:  Hematemesis with nausea    ED Discharge Orders    None       [place] Shannahan, Annie Main, MD 04/29/18 [phone]

## 2018-04-29 ENCOUNTER — Inpatient Hospital Stay (HOSPITAL_COMMUNITY): Payer: Medicare Other | Admitting: Certified Registered Nurse Anesthetist

## 2018-04-29 ENCOUNTER — Encounter (HOSPITAL_COMMUNITY): Payer: Self-pay | Admitting: Certified Registered Nurse Anesthetist

## 2018-04-29 ENCOUNTER — Other Ambulatory Visit: Payer: Self-pay

## 2018-04-29 ENCOUNTER — Encounter (HOSPITAL_COMMUNITY)
Admission: EM | Disposition: A | Discharge status: Home / Self Care | Payer: Self-pay | Source: Home / Self Care | Attending: Internal Medicine

## 2018-04-29 DIAGNOSIS — E785 Hyperlipidemia, unspecified: Secondary | ICD-10-CM | POA: Diagnosis not present

## 2018-04-29 DIAGNOSIS — Z95820 Peripheral vascular angioplasty status with implants and grafts: Secondary | ICD-10-CM | POA: Diagnosis not present

## 2018-04-29 DIAGNOSIS — K219 Gastro-esophageal reflux disease without esophagitis: Secondary | ICD-10-CM | POA: Diagnosis not present

## 2018-04-29 DIAGNOSIS — J441 Chronic obstructive pulmonary disease with (acute) exacerbation: Secondary | ICD-10-CM | POA: Diagnosis not present

## 2018-04-29 DIAGNOSIS — N179 Acute kidney failure, unspecified: Secondary | ICD-10-CM | POA: Diagnosis present

## 2018-04-29 DIAGNOSIS — K921 Melena: Secondary | ICD-10-CM | POA: Diagnosis not present

## 2018-04-29 DIAGNOSIS — Z7982 Long term (current) use of aspirin: Secondary | ICD-10-CM | POA: Diagnosis not present

## 2018-04-29 DIAGNOSIS — I739 Peripheral vascular disease, unspecified: Secondary | ICD-10-CM | POA: Diagnosis present

## 2018-04-29 DIAGNOSIS — M160 Bilateral primary osteoarthritis of hip: Secondary | ICD-10-CM | POA: Diagnosis not present

## 2018-04-29 DIAGNOSIS — K259 Gastric ulcer, unspecified as acute or chronic, without hemorrhage or perforation: Secondary | ICD-10-CM | POA: Diagnosis not present

## 2018-04-29 DIAGNOSIS — A419 Sepsis, unspecified organism: Secondary | ICD-10-CM | POA: Diagnosis present

## 2018-04-29 DIAGNOSIS — I10 Essential (primary) hypertension: Secondary | ICD-10-CM | POA: Diagnosis not present

## 2018-04-29 DIAGNOSIS — Z823 Family history of stroke: Secondary | ICD-10-CM | POA: Diagnosis not present

## 2018-04-29 DIAGNOSIS — Z720 Tobacco use: Secondary | ICD-10-CM | POA: Diagnosis not present

## 2018-04-29 DIAGNOSIS — K449 Diaphragmatic hernia without obstruction or gangrene: Secondary | ICD-10-CM | POA: Diagnosis not present

## 2018-04-29 DIAGNOSIS — D62 Acute posthemorrhagic anemia: Secondary | ICD-10-CM | POA: Diagnosis not present

## 2018-04-29 DIAGNOSIS — Z87442 Personal history of urinary calculi: Secondary | ICD-10-CM | POA: Diagnosis not present

## 2018-04-29 DIAGNOSIS — F329 Major depressive disorder, single episode, unspecified: Secondary | ICD-10-CM | POA: Diagnosis present

## 2018-04-29 DIAGNOSIS — Z981 Arthrodesis status: Secondary | ICD-10-CM | POA: Diagnosis not present

## 2018-04-29 DIAGNOSIS — K920 Hematemesis: Secondary | ICD-10-CM | POA: Diagnosis present

## 2018-04-29 DIAGNOSIS — J449 Chronic obstructive pulmonary disease, unspecified: Secondary | ICD-10-CM | POA: Diagnosis not present

## 2018-04-29 DIAGNOSIS — G459 Transient cerebral ischemic attack, unspecified: Secondary | ICD-10-CM | POA: Diagnosis not present

## 2018-04-29 DIAGNOSIS — R6510 Systemic inflammatory response syndrome (SIRS) of non-infectious origin without acute organ dysfunction: Secondary | ICD-10-CM | POA: Diagnosis not present

## 2018-04-29 DIAGNOSIS — J9601 Acute respiratory failure with hypoxia: Secondary | ICD-10-CM

## 2018-04-29 DIAGNOSIS — I2510 Atherosclerotic heart disease of native coronary artery without angina pectoris: Secondary | ICD-10-CM | POA: Diagnosis not present

## 2018-04-29 DIAGNOSIS — M19041 Primary osteoarthritis, right hand: Secondary | ICD-10-CM | POA: Diagnosis not present

## 2018-04-29 DIAGNOSIS — F419 Anxiety disorder, unspecified: Secondary | ICD-10-CM | POA: Diagnosis present

## 2018-04-29 DIAGNOSIS — K922 Gastrointestinal hemorrhage, unspecified: Secondary | ICD-10-CM | POA: Diagnosis not present

## 2018-04-29 DIAGNOSIS — I9589 Other hypotension: Secondary | ICD-10-CM | POA: Diagnosis not present

## 2018-04-29 DIAGNOSIS — K2951 Unspecified chronic gastritis with bleeding: Secondary | ICD-10-CM | POA: Diagnosis not present

## 2018-04-29 DIAGNOSIS — J45990 Exercise induced bronchospasm: Secondary | ICD-10-CM | POA: Diagnosis not present

## 2018-04-29 DIAGNOSIS — N170 Acute kidney failure with tubular necrosis: Secondary | ICD-10-CM | POA: Diagnosis not present

## 2018-04-29 DIAGNOSIS — E861 Hypovolemia: Secondary | ICD-10-CM | POA: Diagnosis not present

## 2018-04-29 DIAGNOSIS — M19042 Primary osteoarthritis, left hand: Secondary | ICD-10-CM | POA: Diagnosis not present

## 2018-04-29 DIAGNOSIS — E876 Hypokalemia: Secondary | ICD-10-CM | POA: Diagnosis present

## 2018-04-29 DIAGNOSIS — K254 Chronic or unspecified gastric ulcer with hemorrhage: Secondary | ICD-10-CM | POA: Diagnosis not present

## 2018-04-29 DIAGNOSIS — K589 Irritable bowel syndrome without diarrhea: Secondary | ICD-10-CM | POA: Diagnosis present

## 2018-04-29 HISTORY — PX: ESOPHAGOGASTRODUODENOSCOPY (EGD) WITH PROPOFOL: SHX5813

## 2018-04-29 LAB — URINALYSIS, ROUTINE W REFLEX MICROSCOPIC
Bacteria, UA: NONE SEEN
Bilirubin Urine: NEGATIVE
Glucose, UA: NEGATIVE mg/dL
Ketones, ur: NEGATIVE mg/dL
Leukocytes, UA: NEGATIVE
Nitrite: NEGATIVE
Protein, ur: NEGATIVE mg/dL
Specific Gravity, Urine: 1.008 (ref 1.005–1.030)
pH: 7 (ref 5.0–8.0)

## 2018-04-29 LAB — CBC
HCT: 33.4 % — ABNORMAL LOW (ref 36.0–46.0)
HCT: 31.1 % — ABNORMAL LOW (ref 36.0–46.0)
HCT: 30 % — ABNORMAL LOW (ref 36.0–46.0)
HCT: 30.5 % — ABNORMAL LOW (ref 36.0–46.0)
Hemoglobin: 11.3 g/dL — ABNORMAL LOW (ref 12.0–15.0)
Hemoglobin: 10.7 g/dL — ABNORMAL LOW (ref 12.0–15.0)
Hemoglobin: 10.2 g/dL — ABNORMAL LOW (ref 12.0–15.0)
Hemoglobin: 10.5 g/dL — ABNORMAL LOW (ref 12.0–15.0)
MCH: 31.6 pg (ref 26.0–34.0)
MCH: 31.8 pg (ref 26.0–34.0)
MCH: 31.6 pg (ref 26.0–34.0)
MCH: 31.9 pg (ref 26.0–34.0)
MCHC: 33.8 g/dL (ref 30.0–36.0)
MCHC: 34.4 g/dL (ref 30.0–36.0)
MCHC: 34 g/dL (ref 30.0–36.0)
MCHC: 34.4 g/dL (ref 30.0–36.0)
MCV: 93.3 fL (ref 78.0–100.0)
MCV: 92.6 fL (ref 78.0–100.0)
MCV: 92.9 fL (ref 78.0–100.0)
MCV: 92.7 fL (ref 78.0–100.0)
Platelets: 354 10*3/uL (ref 150–400)
Platelets: 329 10*3/uL (ref 150–400)
Platelets: 316 10*3/uL (ref 150–400)
Platelets: 316 10*3/uL (ref 150–400)
RBC: 3.58 MIL/uL — ABNORMAL LOW (ref 3.87–5.11)
RBC: 3.36 MIL/uL — ABNORMAL LOW (ref 3.87–5.11)
RBC: 3.23 MIL/uL — ABNORMAL LOW (ref 3.87–5.11)
RBC: 3.29 MIL/uL — ABNORMAL LOW (ref 3.87–5.11)
RDW: 13.2 % (ref 11.5–15.5)
RDW: 13.4 % (ref 11.5–15.5)
RDW: 13.4 % (ref 11.5–15.5)
RDW: 13.4 % (ref 11.5–15.5)
WBC: 12.3 10*3/uL — ABNORMAL HIGH (ref 4.0–10.5)
WBC: 11.2 10*3/uL — ABNORMAL HIGH (ref 4.0–10.5)
WBC: 10 10*3/uL (ref 4.0–10.5)
WBC: 10.9 10*3/uL — ABNORMAL HIGH (ref 4.0–10.5)

## 2018-04-29 LAB — LACTIC ACID, PLASMA
Lactic Acid, Venous: 1.7 mmol/L (ref 0.5–1.9)
Lactic Acid, Venous: 2.4 mmol/L (ref 0.5–1.9)
Lactic Acid, Venous: 1.1 mmol/L (ref 0.5–1.9)

## 2018-04-29 LAB — BASIC METABOLIC PANEL
Anion gap: 9 (ref 5–15)
Anion gap: 10 (ref 5–15)
BUN: 34 mg/dL — ABNORMAL HIGH (ref 6–20)
BUN: 18 mg/dL (ref 6–20)
CO2: 23 mmol/L (ref 22–32)
CO2: 23 mmol/L (ref 22–32)
Calcium: 8.2 mg/dL — ABNORMAL LOW (ref 8.9–10.3)
Calcium: 8.4 mg/dL — ABNORMAL LOW (ref 8.9–10.3)
Chloride: 109 mmol/L (ref 101–111)
Chloride: 109 mmol/L (ref 101–111)
Creatinine, Ser: 1.01 mg/dL — ABNORMAL HIGH (ref 0.44–1.00)
Creatinine, Ser: 0.85 mg/dL (ref 0.44–1.00)
GFR calc Af Amer: >60 mL/min (ref >60)
GFR calc Af Amer: >60 mL/min (ref >60)
GFR calc non Af Amer: 56 mL/min — ABNORMAL LOW (ref >60)
GFR calc non Af Amer: >60 mL/min (ref >60)
Glucose, Bld: 114 mg/dL — ABNORMAL HIGH (ref 65–99)
Glucose, Bld: 91 mg/dL (ref 65–99)
Potassium: 2.8 mmol/L — ABNORMAL LOW (ref 3.5–5.1)
Potassium: 2.9 mmol/L — ABNORMAL LOW (ref 3.5–5.1)
Sodium: 141 mmol/L (ref 135–145)
Sodium: 142 mmol/L (ref 135–145)

## 2018-04-29 LAB — MAGNESIUM
Magnesium: 1.5 mg/dL — ABNORMAL LOW (ref 1.7–2.4)
Magnesium: 1.8 mg/dL (ref 1.7–2.4)

## 2018-04-29 LAB — CREATININE, URINE, RANDOM: Creatinine, Urine: 15.84 mg/dL

## 2018-04-29 LAB — APTT: aPTT: 26 seconds (ref 24–36)

## 2018-04-29 LAB — PROCALCITONIN: Procalcitonin: <0.1 ng/mL

## 2018-04-29 LAB — STREP PNEUMONIAE URINARY ANTIGEN: Strep Pneumo Urinary Antigen: NEGATIVE

## 2018-04-29 LAB — CBG MONITORING, ED: Glucose-Capillary: 110 mg/dL — ABNORMAL HIGH (ref 65–99)

## 2018-04-29 LAB — INFLUENZA PANEL BY PCR (TYPE A & B)
Influenza A By PCR: NEGATIVE
Influenza B By PCR: NEGATIVE

## 2018-04-29 LAB — BRAIN NATRIURETIC PEPTIDE: B Natriuretic Peptide: 26.6 pg/mL (ref 0.0–100.0)

## 2018-04-29 SURGERY — ESOPHAGOGASTRODUODENOSCOPY (EGD) WITH PROPOFOL
Anesthesia: Monitor Anesthesia Care

## 2018-04-29 MED ORDER — BUPROPION HCL ER (XL) 150 MG PO TB24
150.0000 mg | ORAL_TABLET | Freq: Every day | ORAL | Status: DC
  Administered 2018-04-29 – 2018-04-30 (×2): 150 mg via ORAL
  Filled 2018-04-29 (×2): qty 1

## 2018-04-29 MED ORDER — LEVALBUTEROL HCL 1.25 MG/0.5ML IN NEBU
1.2500 mg | INHALATION_SOLUTION | Freq: Three times a day (TID) | RESPIRATORY_TRACT | Status: DC
  Administered 2018-04-29: 1.25 mg via RESPIRATORY_TRACT
  Filled 2018-04-29: qty 0.5

## 2018-04-29 MED ORDER — ONDANSETRON HCL 4 MG/2ML IJ SOLN: 4.0000 mg | Freq: Four times a day (QID) | INTRAMUSCULAR | Status: DC | PRN

## 2018-04-29 MED ORDER — SODIUM CHLORIDE 0.9 % IV SOLN: INTRAVENOUS | Status: DC

## 2018-04-29 MED ORDER — POTASSIUM CHLORIDE 10 MEQ/100ML IV SOLN
10.0000 meq | INTRAVENOUS | Status: AC
  Administered 2018-04-29 (×3): 10 meq via INTRAVENOUS
  Filled 2018-04-29 (×3): qty 100

## 2018-04-29 MED ORDER — ARIPIPRAZOLE 2 MG PO TABS
2.0000 mg | ORAL_TABLET | Freq: Every day | ORAL | Status: DC
  Administered 2018-04-29 – 2018-04-30 (×2): 2 mg via ORAL
  Filled 2018-04-29 (×3): qty 1

## 2018-04-29 MED ORDER — FLUTICASONE PROPIONATE 50 MCG/ACT NA SUSP
1.0000 | Freq: Every day | NASAL | Status: DC
  Administered 2018-04-29 – 2018-04-30 (×2): 1 via NASAL
  Filled 2018-04-29: qty 16

## 2018-04-29 MED ORDER — VENLAFAXINE HCL ER 75 MG PO CP24
75.0000 mg | ORAL_CAPSULE | Freq: Every day | ORAL | Status: DC
  Administered 2018-04-30: 75 mg via ORAL
  Filled 2018-04-29 (×2): qty 1

## 2018-04-29 MED ORDER — HYDRALAZINE HCL 20 MG/ML IJ SOLN: 5.0000 mg | INTRAMUSCULAR | Status: DC | PRN

## 2018-04-29 MED ORDER — PANTOPRAZOLE SODIUM 40 MG PO TBEC
40.0000 mg | DELAYED_RELEASE_TABLET | Freq: Two times a day (BID) | ORAL | Status: DC
  Administered 2018-04-29 – 2018-04-30 (×2): 40 mg via ORAL
  Filled 2018-04-29 (×2): qty 1

## 2018-04-29 MED ORDER — ACETAMINOPHEN 650 MG RE SUPP: 650.0000 mg | Freq: Four times a day (QID) | RECTAL | Status: DC | PRN

## 2018-04-29 MED ORDER — ADULT MULTIVITAMIN W/MINERALS CH
1.0000 | ORAL_TABLET | Freq: Every day | ORAL | Status: DC
  Administered 2018-04-29 – 2018-04-30 (×2): 1 via ORAL
  Filled 2018-04-29 (×2): qty 1

## 2018-04-29 MED ORDER — TRAMADOL HCL 50 MG PO TABS
100.0000 mg | ORAL_TABLET | Freq: Three times a day (TID) | ORAL | Status: DC | PRN
  Administered 2018-04-29 – 2018-04-30 (×4): 100 mg via ORAL
  Filled 2018-04-29 (×4): qty 2

## 2018-04-29 MED ORDER — COQ-10 200 MG PO CAPS: 1.0000 | ORAL_CAPSULE | Freq: Every day | ORAL | Status: DC

## 2018-04-29 MED ORDER — ONDANSETRON HCL 4 MG PO TABS: 4.0000 mg | ORAL_TABLET | Freq: Four times a day (QID) | ORAL | Status: DC | PRN

## 2018-04-29 MED ORDER — HYPROMELLOSE (GONIOSCOPIC) 2.5 % OP SOLN: 1.0000 [drp] | Freq: Every day | OPHTHALMIC | Status: DC | PRN

## 2018-04-29 MED ORDER — PROPOFOL 500 MG/50ML IV EMUL
INTRAVENOUS | Status: DC | PRN
  Administered 2018-04-29: 75 ug/kg/min via INTRAVENOUS

## 2018-04-29 MED ORDER — PROPOFOL 10 MG/ML IV BOLUS
INTRAVENOUS | Status: DC | PRN
  Administered 2018-04-29 (×3): 20 mg via INTRAVENOUS

## 2018-04-29 MED ORDER — IPRATROPIUM BROMIDE 0.02 % IN SOLN
0.5000 mg | Freq: Three times a day (TID) | RESPIRATORY_TRACT | Status: DC
  Administered 2018-04-29: 0.5 mg via RESPIRATORY_TRACT
  Filled 2018-04-29: qty 2.5

## 2018-04-29 MED ORDER — PRAVASTATIN SODIUM 40 MG PO TABS
80.0000 mg | ORAL_TABLET | Freq: Every day | ORAL | Status: DC
Start: 2018-04-29 — End: 2018-04-30
  Administered 2018-04-29: 80 mg via ORAL
  Filled 2018-04-29: qty 2

## 2018-04-29 MED ORDER — ACETAMINOPHEN 325 MG PO TABS: 650.0000 mg | ORAL_TABLET | Freq: Four times a day (QID) | ORAL | Status: DC | PRN

## 2018-04-29 MED ORDER — AZITHROMYCIN 250 MG PO TABS
250.0000 mg | ORAL_TABLET | Freq: Every day | ORAL | Status: DC
  Administered 2018-04-30: 250 mg via ORAL
  Filled 2018-04-29: qty 1

## 2018-04-29 MED ORDER — ZOLPIDEM TARTRATE 5 MG PO TABS
5.0000 mg | ORAL_TABLET | Freq: Every day | ORAL | Status: DC
  Administered 2018-04-29 (×2): 5 mg via ORAL
  Filled 2018-04-29 (×2): qty 1

## 2018-04-29 MED ORDER — LEVALBUTEROL HCL 1.25 MG/0.5ML IN NEBU
1.2500 mg | INHALATION_SOLUTION | Freq: Four times a day (QID) | RESPIRATORY_TRACT | Status: DC
  Administered 2018-04-29 (×2): 1.25 mg via RESPIRATORY_TRACT
  Filled 2018-04-29 (×3): qty 0.5

## 2018-04-29 MED ORDER — DM-GUAIFENESIN ER 30-600 MG PO TB12: 1.0000 | ORAL_TABLET | Freq: Two times a day (BID) | ORAL | Status: DC | PRN

## 2018-04-29 MED ORDER — ALPRAZOLAM 0.5 MG PO TABS: 1.0000 mg | ORAL_TABLET | Freq: Every evening | ORAL | Status: DC | PRN

## 2018-04-29 MED ORDER — FENOFIBRATE 160 MG PO TABS
160.0000 mg | ORAL_TABLET | Freq: Every day | ORAL | Status: DC
  Administered 2018-04-29 – 2018-04-30 (×2): 160 mg via ORAL
  Filled 2018-04-29 (×3): qty 1

## 2018-04-29 MED ORDER — AZITHROMYCIN 250 MG PO TABS
500.0000 mg | ORAL_TABLET | Freq: Every day | ORAL | Status: AC
  Administered 2018-04-29: 500 mg via ORAL
  Filled 2018-04-29: qty 2

## 2018-04-29 MED ORDER — MAGNESIUM SULFATE IN D5W 1-5 GM/100ML-% IV SOLN
1.0000 g | Freq: Once | INTRAVENOUS | Status: AC
Start: 2018-04-29 — End: 2018-04-29
  Administered 2018-04-29: 1 g via INTRAVENOUS
  Filled 2018-04-29: qty 100

## 2018-04-29 MED ORDER — SODIUM CHLORIDE 0.9 % IV BOLUS
1000.0000 mL | Freq: Once | INTRAVENOUS | Status: AC
  Administered 2018-04-29: 1000 mL via INTRAVENOUS

## 2018-04-29 MED ORDER — BUDESONIDE 0.25 MG/2ML IN SUSP
0.2500 mg | Freq: Two times a day (BID) | RESPIRATORY_TRACT | Status: DC
  Administered 2018-04-29 – 2018-04-30 (×2): 0.25 mg via RESPIRATORY_TRACT
  Filled 2018-04-29 (×2): qty 2

## 2018-04-29 MED ORDER — POTASSIUM CHLORIDE 20 MEQ/15ML (10%) PO SOLN
40.0000 meq | Freq: Once | ORAL | Status: AC
  Administered 2018-04-29: 40 meq via ORAL
  Filled 2018-04-29: qty 30

## 2018-04-29 MED ORDER — MUPIROCIN 2 % EX OINT
1.0000 "application " | TOPICAL_OINTMENT | CUTANEOUS | Status: DC | PRN
  Filled 2018-04-29: qty 22

## 2018-04-29 MED ORDER — IPRATROPIUM BROMIDE 0.02 % IN SOLN
0.5000 mg | RESPIRATORY_TRACT | Status: DC
  Administered 2018-04-29 (×3): 0.5 mg via RESPIRATORY_TRACT
  Filled 2018-04-29 (×3): qty 2.5

## 2018-04-29 MED ORDER — NICOTINE 21 MG/24HR TD PT24
21.0000 mg | MEDICATED_PATCH | Freq: Every day | TRANSDERMAL | Status: DC
  Filled 2018-04-29: qty 1

## 2018-04-29 SURGICAL SUPPLY — 14 items

## 2018-04-29 NOTE — H&P (Addendum)
History and Physical    Brenda Carney BMW:[id number] DOB: 1951/05/16 DOA: 04/28/2018  Referring MD/NP/PA:   PCP: Leeroy Cha, MD   Patient coming from:  The patient is coming from home.  At baseline, pt is independent for most of ADL.  Chief Complaint: Hematemesis, cough, shortness of breath, generalized weakness  HPI: Brenda Carney is a 67 y.o. female with medical history significant of hypertension, hyperlipidemia, COPD, TIA, GERD, depression, anxiety, cerebral aneurysm, CAD, IBS, PAD, thoracic aortic ectasis, OSA not on CPAP, tobacco abuse, who presents with hematemesis, cough, shortness of breath, generalized weakness.  Patient states that she has been having generalized weakness in the past several days, which has been progressively getting worse.  She does not have a unilateral weakness or numbness and tingling to extremities.  No facial droop or slurred speech.  Patient states that she has productive cough and a mild shortness of breath, but no chest pain.  No fever or chills.  Patient states that she had 3 episodes of her hematemesis today, with bright red blood.  She denies dizziness or lightheadedness.  Patient states that she is taking aspirin and Aleve sometimes.  She states that she never had a EGD, but she had a colonoscopy several years ago, which showed polyps.  Patient denies symptoms of UTI.  No rectal bleeding or hematuria.  Patient had hypotension with blood pressure 87/54, which improved to 136/80 after 2 L of normal saline bolus in ED.  ED Course: pt was found to have hemoglobin 13.3, negative FOBT, WBC 15.9, lactic acid 2.33, INR 1.02, pending urinalysis, potassium 2.8, acute renal injury with creatinine 1.60, temperature normal, bradycardia, tachypnea, oxygen saturation 86% on room air, chest x-ray showed COPD without infiltration.  Patient is admitted to telemetry bed as inpatient.  Review of Systems:   General: no fevers, chills, no body weight gain, has  poor appetite, has fatigue HEENT: no blurry vision, hearing changes or sore throat Respiratory: Has dyspnea, coughing, wheezing CV: no chest pain, no palpitations GI: Has hematemesis, no abdominal pain, diarrhea, constipation GU: no dysuria, burning on urination, increased urinary frequency, hematuria  Ext: no leg edema Neuro: no unilateral weakness, numbness, or tingling, no vision change or hearing loss Skin: no rash, no skin tear. MSK: No muscle spasm, no deformity, no limitation of range of movement in spin Heme: No easy bruising.  Travel history: No recent long distant travel.  Allergy:  Allergies  Allergen Reactions  . Penicillins Swelling    TONGUE SWELLING  PATIENT HAD A PCN REACTION WITH IMMEDIATE RASH, FACIAL/TONGUE/THROAT SWELLING, SOB, OR LIGHTHEADEDNESS WITH HYPOTENSION:  #  #  #  YES  #  #  #   Has patient had a PCN reaction causing severe rash involving mucus membranes or skin necrosis: No Has patient had a PCN reaction that required hospitalization: No Has patient had a PCN reaction occurring within the last 10 years: No   . Lyrica [Pregabalin] Other (See Comments)    DRY MOUTH    Past Medical History:  Diagnosis Date  . Anemia    as a younger woman  . Arthritis    "thumbs; hips" (05/14/2017)  . 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 Cerebrovascular disease    Carotid dopplers which showed no significant increase in velocities, extremely minimal on the left, antegrade vertebral bilaterally.  . Cervical spondylosis   . Chronic lower back pain   . COPD (chronic obstructive  pulmonary disease) ([place])    "little bit" (05/14/2017)  . Coronary artery disease, non-occlusive    Coronary calcium scoring: June 2018: Score 1106. 98 percentile for age -[place]  . Depression   . Exercise-induced asthma with acute exacerbation    "only in very hot weather" (05/14/2017)  . GERD (gastroesophageal  reflux disease)   . WNIOEVOJ(500.9)    "monthly" (05/14/2017)  . History of IBS    resovlved  . History of kidney stones   . 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 Hyperlipidemia with target LDL less than 70    Mostly statin intolerant,. Currently on Livalo 4 mg  . Hypertension, benign   . Lumbosacral spondylosis   . Migraine headache    "were monthly; none recently" (05/14/2017)  . PAD (peripheral artery disease) (HCC)    Status post right above-the-knee-below the knee popliteal artery bypass; postop right ABI 0.96.  Marland Kitchen Pneumonia "several times"  . Sleep apnea    does not wear CPAP; "think it was medication related" (05/14/2017)  . Thoracic aortic ectasia (HCC)    ~40 mm on Coronary Calcium Score CT - recommend f/u CTA or MRA  . TIA (transient ischemic attack) 09/2011   "several"    Past Surgical History:  Procedure Laterality Date  . ABDOMINAL AORTOGRAM W/LOWER EXTREMITY N/A 05/14/2017   Procedure: Abdominal Aortogram w/Lower Extremity;  Surgeon: Conrad Algoma, MD;  Location: [facility];  Service: Cardiovascular;  Laterality: N/A;  . ACROMIO-CLAVICULAR JOINT REPAIR Left 1990s   "I think it was called an AC joint removal"  . BACK SURGERY    . BYPASS GRAFT POPLITEAL TO POPLITEAL Right 05/15/2017   Procedure: BYPASS GRAFT ABOVE KNEE POPLITEAL TO BELOW KNEE POPLITEAL ARTERY;  Surgeon: Conrad Jackson Lake, MD;  Location: [facility];  Service: Vascular;  Laterality: Right;  . FOOT FRACTURE SURGERY Bilateral    multiple procedures  . FRACTURE SURGERY    . MAXIMUM ACCESS (MAS)POSTERIOR LUMBAR INTERBODY FUSION (PLIF) 1 LEVEL N/A 09/30/2014   Procedure: FOR MAXIMUM ACCESS (MAS) POSTERIOR LUMBAR INTERBODY FUSION (PLIF) 1 LEVEL;  Surgeon: Eustace Moore, MD;  Location: [facility];  Service: Neurosurgery;  Laterality: N/A;  FOR MAXIMUM ACCESS (MAS) POSTERIOR LUMBAR INTERBODY FUSION (PLIF) 1 LEVEL LUMBAR 4-5  . MULTIPLE TOOTH EXTRACTIONS Right 04/2017  . NM MYOVIEW LTD   06/2017    Normal EF, 66%. No ST changes. Normal study. LOW RISK.  Marland Kitchen TRANSTHORACIC ECHOCARDIOGRAM  09/07/2013   Done to evaluate TIA: Normal LV size and function. EF 55-60%. GR 1 DD. Mild left atrial dilation. Mildly calcified mitral leaflets. Otherwise normal.    Social History:  reports that she has been smoking cigarettes.  She has a 0.72 pack-year smoking history. She has never used smokeless tobacco. She reports that she drinks about 1.8 oz of alcohol per week. She reports that she does not use drugs.  Family History:  Family History  Problem Relation Age of Onset  . Stroke Father   . Heart attack Mother   . Stroke Mother      Prior to Admission medications   Medication Sig Start Date End Date Taking? Authorizing Provider  ALPRAZolam Duanne Moron) 1 MG tablet Take 1 mg by mouth at bedtime as needed for anxiety.    [provider]  amLODipine (NORVASC) 10 MG tablet Take 10 mg by mouth daily.    [provider]  ARIPiprazole (ABILIFY) 2 MG tablet Take  2 mg by mouth daily.    [provider]  aspirin EC 81 MG tablet Take 81 mg by mouth 2 (two) times daily.    [provider]  Aspirin-Acetaminophen-Caffeine (EXCEDRIN PO) Take 2 tablets by mouth 2 (two) times daily as needed (pain).    [provider]  Coenzyme Q10 (COQ-10) 200 MG CAPS Take 1 capsule by mouth daily.    [provider]  desvenlafaxine (PRISTIQ) 100 MG 24 hr tablet Take 100 mg by mouth daily.    [provider]  Evolocumab (REPATHA SURECLICK) 160 MG/ML SOAJ Inject 140 mg into the skin every 14 (fourteen) days. 06/07/17   Leonie Man, MD  fenofibrate (TRICOR) 145 MG tablet Take 145 mg by mouth daily.  08/23/15   [provider]  KRILL OIL PO Take 1 capsule by mouth daily.    [provider]  lisinopril-hydrochlorothiazide (PRINZIDE,ZESTORETIC) 20-12.5 MG per tablet Take 1 tablet by mouth 2 (two) times daily.  11/23/14   [provider]    mometasone (NASONEX) 50 MCG/ACT nasal spray Place 2 sprays into the nose daily as needed (allergies).    [provider]  Multiple Vitamin (MULTIVITAMIN WITH MINERALS) TABS tablet Take 1 tablet by mouth daily.    [provider]  mupirocin ointment (BACTROBAN) 2 % Apply 1 application topically as needed (breakouts).  10/05/15   [provider]  OVER THE COUNTER MEDICATION Take 4 capsules by mouth daily as needed (energy). SeroVital otc supplement    [provider]  Pitavastatin Calcium 4 MG TABS Take 4 mg by mouth daily.  06/07/17   Leonie Man, MD  potassium chloride (K-DUR,KLOR-CON) 10 MEQ tablet Take 10 mEq by mouth daily.    [provider]  Propylene Glycol-Glycerin (SOOTHE OP) Apply 1 drop to eye daily as needed (dry eyes).    [provider]  traMADol (ULTRAM) 50 MG tablet Take 100 mg by mouth 3 (three) times daily as needed for moderate pain.     [provider]  zolpidem (AMBIEN) 10 MG tablet Take 20 mg by mouth at bedtime.     [provider]    Physical Exam: Vitals:   04/28/18 2236 04/28/18 2345 04/29/18 0030  BP: (!) 87/54 102/68 136/82  Pulse: 76  (!) 103  Resp: 16 14 (!) 21  Temp: 98.6 F (37 C)    TempSrc: Oral    SpO2: 91%  100%   General: Not in acute distress.  Dry mucous membrane HEENT:       Eyes: PERRL, EOMI, no scleral icterus.       ENT: No discharge from the ears and nose, no pharynx injection, no tonsillar enlargement.        Neck: No JVD, no bruit, no mass felt. Heme: No neck lymph node enlargement. Cardiac: S1/S2, RRR, No murmurs, No gallops or rubs. Respiratory: Has wheezing and rhonchi bilaterally.   GI: Soft, nondistended, nontender, no rebound pain, no organomegaly, BS present. GU: No hematuria Ext: No pitting leg edema bilaterally. 2+DP/PT pulse bilaterally. Musculoskeletal: No joint deformities, No joint redness or warmth, no limitation of ROM in spin. Skin: No rashes.   Neuro: Alert, oriented X3, cranial nerves II-XII grossly intact, moves all extremities normally.  Psych: Patient is not psychotic, no suicidal or hemocidal ideation.  Labs on Admission: I have personally reviewed following labs and imaging studies  CBC: Recent Labs  Lab 04/28/18 2243 04/28/18 2335  WBC 15.9*  --   HGB  12.9 13.3  HCT 37.2 39.0  MCV 92.3  --   PLT 406*  --    Basic Metabolic Panel: Recent Labs  Lab 04/28/18 2243 04/28/18 2335  NA 136 139  K 2.8* 3.0*  CL 100* 101  CO2 22  --   GLUCOSE 139* 124*  BUN 41* 40*  CREATININE 1.64* 1.60*  CALCIUM 9.5  --    GFR: CrCl cannot be calculated (Unknown ideal weight.). Liver Function Tests: Recent Labs  Lab 04/28/18 2243  AST 28  ALT 21  ALKPHOS 46  BILITOT 0.6  PROT 6.1*  ALBUMIN 3.8   Recent Labs  Lab 04/28/18 2252  LIPASE 32   No results for input(s): AMMONIA in the last 168 hours. Coagulation Profile: Recent Labs  Lab 04/28/18 2252  INR 1.02   Cardiac Enzymes: Recent Labs  Lab 04/28/18 2252  TROPONINI <0.03   BNP (last 3 results) No results for input(s): PROBNP in the last 8760 hours. HbA1C: No results for input(s): HGBA1C in the last 72 hours. CBG: No results for input(s): GLUCAP in the last 168 hours. Lipid Profile: No results for input(s): CHOL, HDL, LDLCALC, TRIG, CHOLHDL, LDLDIRECT in the last 72 hours. Thyroid Function Tests: No results for input(s): TSH, T4TOTAL, FREET4, T3FREE, THYROIDAB in the last 72 hours. Anemia Panel: No results for input(s): VITAMINB12, FOLATE, FERRITIN, TIBC, IRON, RETICCTPCT in the last 72 hours. Urine analysis:    Component Value Date/Time   COLORURINE YELLOW 10/02/2011 0224   APPEARANCEUR CLEAR 10/02/2011 0224   LABSPEC 1.019 10/02/2011 0224   PHURINE 6.0 10/02/2011 0224   GLUCOSEU NEGATIVE 10/02/2011 0224   HGBUR NEGATIVE 10/02/2011 0224   BILIRUBINUR NEGATIVE 10/02/2011 0224   KETONESUR NEGATIVE 10/02/2011 0224   PROTEINUR NEGATIVE  10/02/2011 0224   UROBILINOGEN 0.2 10/02/2011 0224   NITRITE NEGATIVE 10/02/2011 0224   LEUKOCYTESUR SMALL (A) 10/02/2011 0224   Sepsis Labs: @LABRCNTIP (procalcitonin:4,lacticidven:4) )No results found for this or any previous visit (from the past 240 hour(s)).   Radiological Exams on Admission: Dg Chest Portable 1 View  Result Date: 04/28/2018 CLINICAL DATA:  67 year old female with shortness of breath. EXAM: PORTABLE CHEST 1 VIEW COMPARISON:  Chest CT dated 06/12/2017 FINDINGS: There is emphysematous changes of the lungs. No focal consolidation, pleural effusion, or pneumothorax. The cardiac silhouette is within normal limits. No acute osseous pathology. IMPRESSION: 1. No acute cardiopulmonary process. 2. COPD. Electronically Signed   By: Anner Crete M.D.   On: 04/28/2018 23:52     EKG: Independently reviewed.  Sinus rhythm, tachycardia, QTC 455, low voltage, anteroseptal infarction pattern.  Assessment/Plan Principal Problem:   Hematemesis Active Problems:   TIA (transient ischemic attack)   Tobacco abuse   Essential hypertension   Hyperlipidemia with target LDL less than 70   Coronary artery disease, non-occlusive   GERD (gastroesophageal reflux disease)   Depression   COPD exacerbation (HCC)   Sepsis (HCC)   Hypokalemia   AKI (acute kidney injury) ([place])   Acute respiratory failure with hypoxia (HCC)   Hematemesis: likely related to ASA and Aleve use, erosive gastritis versus peptic ulcer disease.  Patient never had EGD per patient.  Patient denies abdominal pain.  Hemoglobin 13.3.  Patient had hypotension, which responded to IV fluid resuscitation.  Currently blood pressure is 136/82.  Hemodynamically stable.  - will admit to tele bed as inpt - NPO for possible EGD - IVF: 2L NS bolus, then at 125 mL/hr - Start IV pantoprazole gtt - Zofran IV for nausea -  Avoid NSAIDs and SQ heparin - Maintain IV access (2 large bore IVs if possible). - Monitor closely and follow  q6h cbc, transfuse as necessary, if Hgb<7.0 - LaB: INR, PTT and type screen - please call GI in AM  Acute respiratory failure with hypoxia due to COPD exacerbation: Patient has productive cough, shortness of breath, no infiltration on chest x-ray.  Has wheezing and rhonchi bilateral auscultation, indicating COPD exacerbation. Lactic acid elevated to 2.33.  Blood pressure responded to IV fluids.  Currently hemodynamically stable. -Nebulizers: scheduled Atrovent and prn Xopenex Nebs -will not give steroid due to p peptic ulcer diseaseossible  -Z pak -Mucinex for cough  -Incentive spirometry -Urine S. pneumococcal antigen -Follow up blood culture x2, sputum culture, Flu pcr -Nasal cannula oxygen as needed to maintain O2 saturation 92% or greater -will get Procalcitonin and trend lactic acid levels -IVF: 2L of NS bolus in ED, followed by 125 cc/h   Tobacco abuse: -Did counseling about importance of quitting smoking -Nicotine patch  HTN; Hold blood pressure medications due to hypotension -IV hydralazine.  Hx of TIA (transient ischemic attack): -hold ASA -continue statin  HLD: -Pravastatin and TriCor  Coronary artery disease, non-occlusive: no CP -Hold aspirin -continue pravastatin, TriCor  GERD: -on IV Protonix  Depression and anxiety: Stable, no suicidal or homicidal ideations. -Continue home medications: PRN Xanax, Abilify, Wellbutrin  Hypokalemia: K=2.8 on admission. - Repleted - Check Mg level - Give 1 mg of magnesium sulfate  AKI: Likely due to prerenal secondary to dehydration and continuation of ACEI, diuretics.  ATN may have also contributed partially.  Given hypotension. - IVF as above - Follow up renal function by BMP - Check FeUrea - Hold present  DVT ppx: SCD Code Status: Full code Family Communication: None at bed side.      Disposition Plan:  Anticipate discharge back to previous home environment Consults called:  none Admission status: Inpatient/tele      Date of Service 04/29/2018    Ivor Costa [facility] Pager [phone]  If 7PM-7AM, please contact night-coverage [email or web address] Password [email or web address] 04/29/2018, 1:42 AM

## 2018-04-29 NOTE — Consult Note (Signed)
Referring Provider:  [place] Primary Care Physician:  Leeroy Cha, MD Primary Gastroenterologist:  Dr. Thana Farr   Reason for Consultation:  GI bleed  HPI: Brenda Carney is a 67 y.o. female with past medical history of hypertension, COPD, history of TIA, history of cerebral aneurysm, history of coronary artery and peripheral artery disease admitted to the hospital for further evaluation of coffee-ground emesis,generalized weakness and shortness of breath.upon initial evaluation, she was found to have a normal hemoglobin at 12.9. Normal INR. Normal LFTs.GI is consulted for further evaluation.fecal occult blood negative.patient's hemoglobin dropped to 10.7 now. GI is consulted for further evaluation.  Patient seen and examined at bedside. Patient had 2 episodes of vomiting of blood yesterday. Started noticing black tarry stool this morning. She denies any associated abdominal pain. Denied any diarrhea or constipation. No history of GI bleeding the past. Takes Excedrin and NSAIDs on daily basis. Complaining of intermittent dysphagia to certain foods since early childhood.  Colonoscopy several years ago by Dr. Thana Farr. Report not available to review. No family history of colon cancer.  Past Medical History:  Diagnosis Date  . Anemia    as a younger woman  . Arthritis    "thumbs; hips" (05/14/2017)  . 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 Cerebrovascular disease    Carotid dopplers which showed no significant increase in velocities, extremely minimal on the left, antegrade vertebral bilaterally.  . Cervical spondylosis   . Chronic lower back pain   . COPD (chronic obstructive pulmonary disease) ([place])    "little bit" (05/14/2017)  . Coronary artery disease, non-occlusive    Coronary calcium scoring: June 2018: Score 1106. 98 percentile for age -[place]  . Depression   . Exercise-induced asthma with acute  exacerbation    "only in very hot weather" (05/14/2017)  . GERD (gastroesophageal reflux disease)   . GDJMEQAS(341.9)    "monthly" (05/14/2017)  . History of IBS    resovlved  . History of kidney stones   . 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 Hyperlipidemia with target LDL less than 70    Mostly statin intolerant,. Currently on Livalo 4 mg  . Hypertension, benign   . Lumbosacral spondylosis   . Migraine headache    "were monthly; none recently" (05/14/2017)  . PAD (peripheral artery disease) (HCC)    Status post right above-the-knee-below the knee popliteal artery bypass; postop right ABI 0.96.  Marland Kitchen Pneumonia "several times"  . Sleep apnea    does not wear CPAP; "think it was medication related" (05/14/2017)  . Thoracic aortic ectasia (HCC)    ~40 mm on Coronary Calcium Score CT - recommend f/u CTA or MRA  . TIA (transient ischemic attack) 09/2011   "several"    Past Surgical History:  Procedure Laterality Date  . ABDOMINAL AORTOGRAM W/LOWER EXTREMITY N/A 05/14/2017   Procedure: Abdominal Aortogram w/Lower Extremity;  Surgeon: Conrad Brookneal, MD;  Location: [facility];  Service: Cardiovascular;  Laterality: N/A;  . ACROMIO-CLAVICULAR JOINT REPAIR Left 1990s   "I think it was called an AC joint removal"  . BACK SURGERY    . BYPASS GRAFT POPLITEAL TO POPLITEAL Right 05/15/2017   Procedure: BYPASS GRAFT ABOVE KNEE POPLITEAL TO BELOW KNEE POPLITEAL ARTERY;  Surgeon: Conrad Bucks, MD;  Location: [facility];  Service: Vascular;  Laterality: Right;  . FOOT FRACTURE SURGERY Bilateral    multiple  procedures  . FRACTURE SURGERY    . MAXIMUM ACCESS (MAS)POSTERIOR LUMBAR INTERBODY FUSION (PLIF) 1 LEVEL N/A 09/30/2014   Procedure: FOR MAXIMUM ACCESS (MAS) POSTERIOR LUMBAR INTERBODY FUSION (PLIF) 1 LEVEL;  Surgeon: Eustace Moore, MD;  Location: [facility];  Service: Neurosurgery;  Laterality: N/A;  FOR MAXIMUM ACCESS (MAS) POSTERIOR LUMBAR INTERBODY FUSION (PLIF) 1  LEVEL LUMBAR 4-5  . MULTIPLE TOOTH EXTRACTIONS Right 04/2017  . NM MYOVIEW LTD  06/2017    Normal EF, 66%. No ST changes. Normal study. LOW RISK.  Marland Kitchen TRANSTHORACIC ECHOCARDIOGRAM  09/07/2013   Done to evaluate TIA: Normal LV size and function. EF 55-60%. GR 1 DD. Mild left atrial dilation. Mildly calcified mitral leaflets. Otherwise normal.    Prior to Admission medications   Medication Sig Start Date End Date Taking? Authorizing Provider  ALPRAZolam Duanne Moron) 1 MG tablet Take 1 mg by mouth at bedtime as needed for anxiety.   Yes [provider]  amLODipine (NORVASC) 10 MG tablet Take 10 mg by mouth daily.   Yes [provider]  ARIPiprazole (ABILIFY) 2 MG tablet Take 2 mg by mouth daily.   Yes [provider]  aspirin EC 81 MG tablet Take 81 mg by mouth 2 (two) times daily.   Yes [provider]  Aspirin-Acetaminophen-Caffeine (EXCEDRIN PO) Take 2 tablets by mouth 2 (two) times daily as needed (pain).   Yes [provider]  buPROPion (WELLBUTRIN XL) 150 MG 24 hr tablet Take 150 mg by mouth daily. 04/04/18  Yes [provider]  Coenzyme Q10 (COQ-10) 200 MG CAPS Take 1 capsule by mouth daily.   Yes [provider]  desvenlafaxine (PRISTIQ) 100 MG 24 hr tablet Take 100 mg by mouth daily.   Yes [provider]  Evolocumab (REPATHA SURECLICK) 423 MG/ML SOAJ Inject 140 mg into the skin every 14 (fourteen) days. 06/07/17  Yes Leonie Man, MD  fenofibrate (TRICOR) 145 MG tablet Take 145 mg by mouth daily.  08/23/15  Yes [provider]  KRILL OIL PO Take 1 capsule by mouth daily.   Yes [provider]  lisinopril-hydrochlorothiazide (PRINZIDE,ZESTORETIC) 20-12.5 MG per tablet Take 1 tablet by mouth 2 (two) times daily.  11/23/14  Yes [provider]  mometasone (NASONEX) 50 MCG/ACT nasal spray Place 2 sprays into the nose daily as needed (allergies).   Yes [provider]  Multiple Vitamin  (MULTIVITAMIN WITH MINERALS) TABS tablet Take 1 tablet by mouth daily.   Yes [provider]  Pitavastatin Calcium 4 MG TABS Take 4 mg by mouth daily.  06/07/17  Yes Leonie Man, MD  potassium chloride (K-DUR,KLOR-CON) 10 MEQ tablet Take 10 mEq by mouth daily.   Yes [provider]  Propylene Glycol-Glycerin (SOOTHE OP) Apply 1 drop to eye daily as needed (dry eyes).   Yes [provider]  traMADol (ULTRAM) 50 MG tablet Take 100 mg by mouth 3 (three) times daily as needed for moderate pain.    Yes [provider]  zolpidem (AMBIEN) 10 MG tablet Take 20 mg by mouth at bedtime.    Yes [provider]    Scheduled Meds: . ARIPiprazole  2 mg Oral Daily  . [START ON 04/30/2018] azithromycin  250 mg Oral Daily  . budesonide (PULMICORT) nebulizer solution  0.25 mg Nebulization BID  . buPROPion  150 mg Oral Daily  . fenofibrate  160 mg Oral Daily  . fluticasone  1 spray Each Nare Daily  . ipratropium  0.5 mg Nebulization TID  . levalbuterol  1.25 mg Nebulization TID  . multivitamin with minerals  1 tablet Oral Daily  . nicotine  21 mg Transdermal Daily  . pravastatin  80 mg Oral q1800  . venlafaxine XR  75 mg Oral Q breakfast  . zolpidem  5 mg Oral QHS   Continuous Infusions: . sodium chloride 125 mL/hr at 04/29/18 0109  . pantoprozole (PROTONIX) infusion 8 mg/hr (04/29/18 1104)   PRN Meds:.acetaminophen **OR** acetaminophen, ALPRAZolam, dextromethorphan-guaiFENesin, hydrALAZINE, hydroxypropyl methylcellulose / hypromellose, mupirocin ointment, ondansetron **OR** ondansetron (ZOFRAN) IV, traMADol  Allergies as of 04/28/2018 - Review Complete 04/28/2018  Allergen Reaction Noted  . Penicillins Swelling 09/05/2013  . Lyrica [pregabalin] Other (See Comments) 06/09/2014    Family History  Problem Relation Age of Onset  . Stroke Father   . Heart attack Mother   . Stroke Mother     Social History   Socioeconomic History  . Marital status:  Widowed    Spouse name: [name]   . Number of children: 0  . Years of education: COLLEGE  . Highest education level: Not on file  Occupational History  . Occupation: Horse Lawyer  . Financial resource strain: Not on file  . Food insecurity:    Worry: Not on file    Inability: Not on file  . Transportation needs:    Medical: Not on file    Non-medical: Not on file  Tobacco Use  . Smoking status: Light Tobacco Smoker    Packs/day: 0.02    Years: 36.00    Pack years: 0.72    Types: Cigarettes    Last attempt to quit: 04/24/2017    Years since quitting: 1.0  . Smokeless tobacco: Never Used  Substance and Sexual Activity  . Alcohol use: Yes    Alcohol/week: 1.8 oz    Types: 3 Glasses of wine per week  . Drug use: No  . Sexual activity: Never  Lifestyle  . Physical activity:    Days per week: Not on file    Minutes per session: Not on file  . Stress: Not on file  Relationships  . Social connections:    Talks on phone: Not on file    Gets together: Not on file    Attends religious service: Not on file    Active member of club or organization: Not on file    Attends meetings of clubs or organizations: Not on file    Relationship status: Not on file  . Intimate partner violence:    Fear of current or ex partner: Not on file    Emotionally abused: Not on file    Physically abused: Not on file    Forced sexual activity: Not on file  Other Topics Concern  . Not on file  Social History Narrative   Patient lives at home with her husband [name]. Patient has no children.    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    Patient has BA degree.    Smokes 2-3 cigarettes /day -- former heavy smoker (failed "quitting") -- now says she has truly "QUIT" - however now is been going back and forth between quitting and cheating and probably smokes anywhere from 0-3 cigarettes a day.    Review of Systems: Review of Systems  Constitutional: Positive for  malaise/fatigue. Negative for chills and fever.  HENT: Negative for hearing loss.   Eyes: Negative for blurred vision and double vision.  Respiratory: Positive for shortness of breath. Negative for cough, hemoptysis and sputum production.   Cardiovascular: Negative for chest pain and palpitations.  Gastrointestinal: Positive for heartburn, melena, nausea and vomiting. Negative for abdominal pain, constipation and diarrhea.  Genitourinary: Negative for dysuria and urgency.  Musculoskeletal: Positive for back pain, joint pain and myalgias.  Skin: Negative for itching and rash.  Neurological: Negative for seizures and loss of consciousness.  Endo/Heme/Allergies: Does not bruise/bleed easily.  Psychiatric/Behavioral: Negative for hallucinations and suicidal ideas.    Physical Exam: Vital signs: Vitals:   04/29/18 1100 04/29/18 1212  BP: 122/90 122/74  Pulse: 100 98  Resp: 11 18  Temp:  98.5 F (36.9 C)  SpO2: 99%      Physical Exam  Constitutional: She is oriented to person, place, and time. She appears well-developed and well-nourished. No distress.  HENT:  Head: Normocephalic and atraumatic.  Mouth/Throat: No oropharyngeal exudate.  Oral mucosa dry  Eyes: EOM are normal. No scleral icterus.  Neck: Normal range of motion. Neck supple.  Cardiovascular: Normal rate and regular rhythm.  Pulmonary/Chest: Effort normal and breath sounds normal. No respiratory distress.  Abdominal: Soft. Bowel sounds are normal. She exhibits no distension. There is no tenderness. There is no rebound and no guarding.  Musculoskeletal: Normal range of motion. She exhibits no edema.  Neurological: She is alert and oriented to person, place, and time.  Skin: Skin is dry. No erythema.  Psychiatric: She has a normal mood and affect. Judgment normal.  Vitals reviewed.   GI:  Lab Results: Recent Labs    04/28/18 2243 04/28/18 2335 04/29/18 0224 04/29/18 0840  WBC 15.9*  --  12.3* 11.2*  HGB 12.9  13.3 11.3* 10.7*  HCT 37.2 39.0 33.4* 31.1*  PLT 406*  --  354 329   BMET Recent Labs    04/28/18 2243 04/28/18 2335 04/29/18 0840  NA 136 139 141  K 2.8* 3.0* 2.8*  CL 100* 101 109  CO2 22  --  23  GLUCOSE 139* 124* 114*  BUN 41* 40* 34*  CREATININE 1.64* 1.60* 1.01*  CALCIUM 9.5  --  8.2*   LFT Recent Labs    04/28/18 2243  PROT 6.1*  ALBUMIN 3.8  AST 28  ALT 21  ALKPHOS 46  BILITOT 0.6   PT/INR Recent Labs    04/28/18 2252  LABPROT 13.3  INR 1.02     Studies/Results: Dg Chest Portable 1 View  Result Date: 04/28/2018 CLINICAL DATA:  67 year old female with shortness of breath. EXAM: PORTABLE CHEST 1 VIEW COMPARISON:  Chest CT dated 06/12/2017 FINDINGS: There is emphysematous changes of the lungs. No focal consolidation, pleural effusion, or pneumothorax. The cardiac silhouette is within normal limits. No acute osseous pathology. IMPRESSION: 1. No acute cardiopulmonary process. 2. COPD. Electronically Signed   By: Anner Crete M.D.   On: 04/28/2018 23:52    Impression/Plan: - hematemesis and melena in setting of significant NSAID use. Most likely ulcer disease. - Acute blood loss anemia. - Hypokalemia  Recommendations ----------------------- - EGD today for further evaluation. - continue PPI. Avoid NSAIDs. - Monitor H&H.  Risks (bleeding, infection, bowel perforation that could require surgery, sedation-related changes in cardiopulmonary systems), benefits (identification and possible treatment of source of symptoms, exclusion of certain causes of symptoms), and alternatives (watchful waiting, radiographic imaging studies, empiric medical treatment)  were explained to patient in detail and patient wishes to proceed.   LOS: 0 days   Otis Brace  MD, FACP 04/29/2018, 12:33  PM  Contact #  [phone]

## 2018-04-29 NOTE — Progress Notes (Signed)
67 year old female with history of hypertension, hyperlipidemia, COPD, TIA, GERD, depression, anxiety, cerebral aneurysm, coronary artery disease, IBS, PAD, thoracic aortic stenosis, OSA not on CPAP, tobacco abuse.  Patient was admitted with hematemesis.  Apparently, patient was taking a lot of NSAIDs prior to admission.  EGD done revealed two non-bleeding ( One small and one medium sized) superficial clean-based gastric ulcers with no stigmata of bleeding were found in the prepyloric region of the stomach.  No further bleeding noted.  GI input is appreciated.  Patient will be discharged back home, most likely, in the next 24 hours if patient remains stable.

## 2018-04-29 NOTE — ED Notes (Signed)
Pt states when she was in the restroom she had a sticky black tarry stool.

## 2018-04-29 NOTE — Op Note (Signed)
[facility] Patient Name: Brenda Carney Procedure Date : 04/29/2018 MRN: [id number] Attending MD: Otis Brace , MD Date of Birth: [birth year] CSN: [id number] Age: 67 Admit Type: Inpatient Procedure:                Upper GI endoscopy Indications:              Hematemesis, Melena Providers:                Otis Brace, MD, Zenon Mayo, RN, Tinnie Gens, Technician, Dellie Catholic, CRNA Referring MD:              Medicines:                Sedation Administered by an Anesthesia Professional Complications:            No immediate complications. Estimated Blood Loss:     Estimated blood loss was minimal. Procedure:                Pre-Anesthesia Assessment:                           - Prior to the procedure, a History and Physical                            was performed, and patient medications and                            allergies were reviewed. The patient's tolerance of                            previous anesthesia was also reviewed. The risks                            and benefits of the procedure and the sedation                            options and risks were discussed with the patient.                            All questions were answered, and informed consent                            was obtained. Prior Anticoagulants: The patient has                            taken previous NSAID medication, last dose was 1                            day prior to procedure. ASA Grade Assessment: III -                            A patient with severe systemic disease. After  reviewing the risks and benefits, the patient was                            deemed in satisfactory condition to undergo the                            procedure.                           After obtaining informed consent, the endoscope was                            passed under direct vision. Throughout the   procedure, the patient's blood pressure, pulse, and                            oxygen saturations were monitored continuously. The                            EG-[id number] ([id number]) scope was introduced through the                            mouth, and advanced to the second part of duodenum.                            The upper GI endoscopy was accomplished without                            difficulty. The patient tolerated the procedure                            well. Scope In: Scope Out: Findings:      The Z-line was regular and was found 35 cm from the incisors.      A small hiatal hernia was present.      Two non-bleeding ( One small and one medium sized) superficial       clean-based gastric ulcers with no stigmata of bleeding were found in       the prepyloric region of the stomach. Biopsies were taken with a cold       forceps for Helicobacter pylori testing.      The cardia and gastric fundus were normal on retroflexion.      The first portion of the duodenum and second portion of the duodenum       were normal.      Scattered mild inflammation was found in the duodenal bulb. Impression:               - Z-line regular, 35 cm from the incisors.                           - Small hiatal hernia.                           - Non-bleeding gastric ulcers with no stigmata of  bleeding. Biopsied.                           - Normal first portion of the duodenum and second                            portion of the duodenum.                           - Duodenitis. Recommendation:           - Return patient to hospital ward for ongoing care.                           - Soft diet.                           - Continue present medications.                           - No aspirin, ibuprofen, naproxen, or other                            non-steroidal anti-inflammatory drugs for 5 days.                           - Repeat upper endoscopy in 2 months to check                             healing.                           - Return to my office in 6 weeks. Procedure Code(s):        --- Professional ---                           [id number], Esophagogastroduodenoscopy, flexible,                            transoral; with biopsy, single or multiple Diagnosis Code(s):        --- Professional ---                           K44.9, Diaphragmatic hernia without obstruction or                            gangrene                           K25.9, Gastric ulcer, unspecified as acute or                            chronic, without hemorrhage or perforation                           K92.0, Hematemesis  K92.1, Melena (includes Hematochezia) CPT copyright 2017 American Medical Association. All rights reserved. The codes documented in this report are preliminary and upon coder review may  be revised to meet current compliance requirements. Otis Brace, MD Otis Brace, MD 04/29/2018 2:34:46 PM Number of Addenda: 0

## 2018-04-29 NOTE — Anesthesia Preprocedure Evaluation (Signed)
Anesthesia Evaluation  Patient identified by MRN, date of birth, ID band Patient awake    Reviewed: Allergy & Precautions, NPO status , Patient's Chart, lab work & pertinent test results  Airway Mallampati: II  TM Distance: >3 FB Neck ROM: Full    Dental no notable dental hx.    Pulmonary asthma , sleep apnea , COPD, Current Smoker,    breath sounds clear to auscultation+ rhonchi        Cardiovascular hypertension, Normal cardiovascular exam Rhythm:Regular Rate:Normal     Neuro/Psych negative neurological ROS  negative psych ROS   GI/Hepatic negative GI ROS, Neg liver ROS,   Endo/Other  negative endocrine ROS  Renal/GU Renal diseasenegative Renal ROS  negative genitourinary   Musculoskeletal negative musculoskeletal ROS (+)   Abdominal   Peds negative pediatric ROS (+)  Hematology negative hematology ROS (+)   Anesthesia Other Findings   Reproductive/Obstetrics negative OB ROS                             Anesthesia Physical Anesthesia Plan  ASA: III  Anesthesia Plan: MAC   Post-op Pain Management:    Induction: Intravenous  PONV Risk Score and Plan: 0  Airway Management Planned: Simple Face Mask  Additional Equipment:   Intra-op Plan:   Post-operative Plan:   Informed Consent: I have reviewed the patients History and Physical, chart, labs and discussed the procedure including the risks, benefits and alternatives for the proposed anesthesia with the patient or authorized representative who has indicated his/her understanding and acceptance.   Dental advisory given  Plan Discussed with: CRNA and Surgeon  Anesthesia Plan Comments:         Anesthesia Quick Evaluation

## 2018-04-29 NOTE — Anesthesia Postprocedure Evaluation (Signed)
Anesthesia Post Note  Patient: Brenda Carney  Procedure(s) Performed: ESOPHAGOGASTRODUODENOSCOPY (EGD) WITH PROPOFOL (N/A )     Patient location during evaluation: PACU Anesthesia Type: MAC Level of consciousness: awake and alert Pain management: pain level controlled Vital Signs Assessment: post-procedure vital signs reviewed and stable Respiratory status: spontaneous breathing, nonlabored ventilation, respiratory function stable and patient connected to nasal cannula oxygen Cardiovascular status: stable and blood pressure returned to baseline Postop Assessment: no apparent nausea or vomiting Anesthetic complications: no    Last Vitals:  Vitals:   04/29/18 1431 04/29/18 1440  BP: 129/70 136/75  Pulse: 86 87  Resp: (!) 25 15  Temp: 36.7 C   SpO2: 100% 100%    Last Pain:  Vitals:   04/29/18 1431  TempSrc: Oral  PainSc: 4                  Jamisen Hawes S

## 2018-04-29 NOTE — Brief Op Note (Signed)
04/28/2018 - 04/29/2018  2:29 PM  PATIENT:  Brenda Carney  67 y.o. female  PRE-OPERATIVE DIAGNOSIS:  GI bleed  POST-OPERATIVE DIAGNOSIS:  gastric ulcer and gastritis; tissue biopsy taken to r/o H. pylori  PROCEDURE:  Procedure(s): ESOPHAGOGASTRODUODENOSCOPY (EGD) WITH PROPOFOL (N/A)  SURGEON:  Surgeon(s) and Role:    * Latroy Gaymon, MD - Primary  Findings ----------- - EGD showed one small and one medium sized clean-based ulcer in the stomach. Mild gastritis and duodenitis. No evidence of active bleeding.  Recommendations --------------------------- - change Protonix to by mouth twice a day. Continue twice a day PPI for 8 weeks. - Repeat EGD in 8 weeks to document healing of gastric ulcers. - Avoid NSAIDs - recheck CBC in the morning. Hopefully discharge tomorrow if hemoglobin stable. - Findings discussed with the patient in the recovery room. - GI will follow  Otis Brace MD, [place] 04/29/2018, 2:39 PM  Contact #  [phone]

## 2018-04-29 NOTE — Progress Notes (Signed)
Rec'd pt to room 2W22 via bed from [facility].  No s/s of distress.  Alert and oriented x 4.  Pt on RA.  Sats 98%.  Pt on NS@125  and a Protonix gtt at 8mg /hr.  Will continue to monitor.

## 2018-04-29 NOTE — Progress Notes (Signed)
Pt went to EGD via GI chair.  No s/s of distress at this time.

## 2018-04-29 NOTE — Anesthesia Procedure Notes (Signed)
Procedure Name: MAC Date/Time: 04/29/2018 2:08 PM Performed by: Candis Shine, CRNA Pre-anesthesia Checklist: Patient identified, Emergency Drugs available, Suction available, Patient being monitored and Timeout performed Patient Re-evaluated:Patient Re-evaluated prior to induction Oxygen Delivery Method: Nasal cannula Dental Injury: Teeth and Oropharynx as per pre-operative assessment

## 2018-04-29 NOTE — Transfer of Care (Signed)
Immediate Anesthesia Transfer of Care Note  Patient: Brenda Carney  Procedure(s) Performed: ESOPHAGOGASTRODUODENOSCOPY (EGD) WITH PROPOFOL (N/A )  Patient Location: Endoscopy Unit   Anesthesia Type:MAC  Level of Consciousness: awake, alert  and oriented  Airway & Oxygen Therapy: Patient Spontanous Breathing and Patient connected to nasal cannula oxygen  Post-op Assessment: Report given to RN and Post -op Vital signs reviewed and stable  Post vital signs: Reviewed and stable  Last Vitals:  Vitals Value Taken Time  BP    Temp    Pulse 96 04/29/2018  2:30 PM  Resp 20 04/29/2018  2:30 PM  SpO2 97 % 04/29/2018  2:30 PM  Vitals shown include unvalidated device data.  Last Pain:  Vitals:   04/29/18 1321  TempSrc: Oral  PainSc: 5          Complications: No apparent anesthesia complications

## 2018-04-29 NOTE — ED Notes (Signed)
Admitting MD text paged to make aware of results of morning labs, K 2.8, HGB 10.7.

## 2018-04-30 ENCOUNTER — Encounter (HOSPITAL_COMMUNITY): Payer: Self-pay | Admitting: Gastroenterology

## 2018-04-30 DIAGNOSIS — E861 Hypovolemia: Secondary | ICD-10-CM

## 2018-04-30 DIAGNOSIS — I9589 Other hypotension: Secondary | ICD-10-CM

## 2018-04-30 DIAGNOSIS — J9601 Acute respiratory failure with hypoxia: Secondary | ICD-10-CM

## 2018-04-30 DIAGNOSIS — N179 Acute kidney failure, unspecified: Secondary | ICD-10-CM

## 2018-04-30 DIAGNOSIS — J441 Chronic obstructive pulmonary disease with (acute) exacerbation: Secondary | ICD-10-CM

## 2018-04-30 DIAGNOSIS — K920 Hematemesis: Secondary | ICD-10-CM

## 2018-04-30 LAB — BASIC METABOLIC PANEL
Anion gap: 5 (ref 5–15)
BUN: 11 mg/dL (ref 6–20)
CO2: 24 mmol/L (ref 22–32)
Calcium: 8.8 mg/dL — ABNORMAL LOW (ref 8.9–10.3)
Chloride: 110 mmol/L (ref 101–111)
Creatinine, Ser: 0.91 mg/dL (ref 0.44–1.00)
GFR calc Af Amer: >60 mL/min (ref >60)
GFR calc non Af Amer: >60 mL/min (ref >60)
Glucose, Bld: 107 mg/dL — ABNORMAL HIGH (ref 65–99)
Potassium: 3.6 mmol/L (ref 3.5–5.1)
Sodium: 139 mmol/L (ref 135–145)

## 2018-04-30 LAB — CBC
HCT: 32.9 % — ABNORMAL LOW (ref 36.0–46.0)
Hemoglobin: 11 g/dL — ABNORMAL LOW (ref 12.0–15.0)
MCH: 31 pg (ref 26.0–34.0)
MCHC: 33.4 g/dL (ref 30.0–36.0)
MCV: 92.7 fL (ref 78.0–100.0)
Platelets: 352 10*3/uL (ref 150–400)
RBC: 3.55 MIL/uL — ABNORMAL LOW (ref 3.87–5.11)
RDW: 13.5 % (ref 11.5–15.5)
WBC: 8.1 10*3/uL (ref 4.0–10.5)

## 2018-04-30 LAB — UREA NITROGEN, URINE: Urea Nitrogen, Ur: 309 mg/dL

## 2018-04-30 LAB — MAGNESIUM: Magnesium: 2.1 mg/dL (ref 1.7–2.4)

## 2018-04-30 LAB — GLUCOSE, CAPILLARY: Glucose-Capillary: 109 mg/dL — ABNORMAL HIGH (ref 65–99)

## 2018-04-30 MED ORDER — TIOTROPIUM BROMIDE MONOHYDRATE 18 MCG IN CAPS: 18.0000 ug | ORAL_CAPSULE | Freq: Every day | RESPIRATORY_TRACT | 2 refills | Status: DC

## 2018-04-30 MED ORDER — MAGNESIUM SULFATE IN D5W 1-5 GM/100ML-% IV SOLN
1.0000 g | Freq: Once | INTRAVENOUS | Status: AC
  Administered 2018-04-30: 1 g via INTRAVENOUS
  Filled 2018-04-30: qty 100

## 2018-04-30 MED ORDER — LEVALBUTEROL HCL 1.25 MG/0.5ML IN NEBU: 1.2500 mg | INHALATION_SOLUTION | Freq: Four times a day (QID) | RESPIRATORY_TRACT | Status: DC | PRN

## 2018-04-30 MED ORDER — BUDESONIDE-FORMOTEROL FUMARATE 160-4.5 MCG/ACT IN AERO: 2.0000 | INHALATION_SPRAY | Freq: Two times a day (BID) | RESPIRATORY_TRACT | 12 refills | Status: DC

## 2018-04-30 MED ORDER — POTASSIUM CHLORIDE CRYS ER 20 MEQ PO TBCR
40.0000 meq | EXTENDED_RELEASE_TABLET | Freq: Once | ORAL | Status: AC
  Administered 2018-04-30: 40 meq via ORAL
  Filled 2018-04-30: qty 2

## 2018-04-30 MED ORDER — AMLODIPINE BESYLATE 2.5 MG PO TABS: 2.5000 mg | ORAL_TABLET | Freq: Every day | ORAL | 0 refills | Status: DC

## 2018-04-30 MED ORDER — FERROUS SULFATE 325 (65 FE) MG PO TABS: 325.0000 mg | ORAL_TABLET | Freq: Every day | ORAL | 3 refills | Status: DC

## 2018-04-30 MED ORDER — POTASSIUM CHLORIDE 10 MEQ/100ML IV SOLN
10.0000 meq | INTRAVENOUS | Status: DC
  Administered 2018-04-30 (×2): 10 meq via INTRAVENOUS
  Filled 2018-04-30 (×3): qty 100

## 2018-04-30 MED ORDER — POTASSIUM CHLORIDE CRYS ER 20 MEQ PO TBCR
40.0000 meq | EXTENDED_RELEASE_TABLET | ORAL | Status: AC
  Administered 2018-04-30: 40 meq via ORAL
  Filled 2018-04-30: qty 2

## 2018-04-30 MED ORDER — IPRATROPIUM-ALBUTEROL 20-100 MCG/ACT IN AERS: 1.0000 | INHALATION_SPRAY | Freq: Four times a day (QID) | RESPIRATORY_TRACT | 0 refills | Status: DC | PRN

## 2018-04-30 MED ORDER — ZOLPIDEM TARTRATE 5 MG PO TABS: 5.0000 mg | ORAL_TABLET | Freq: Every day | ORAL | 0 refills | Status: DC

## 2018-04-30 MED ORDER — PANTOPRAZOLE SODIUM 40 MG PO TBEC: 40.0000 mg | DELAYED_RELEASE_TABLET | Freq: Two times a day (BID) | ORAL | 0 refills | Status: DC

## 2018-04-30 NOTE — Progress Notes (Signed)
MD paged and returned call.  IV is leaking on potassium runs and pt has discharge orders.  States to give Kdur 40 mEq po and can discharge.  Discussed concern about sending pt home without rechecking potassium due to it not increasing previously when replaced.  States to give Kdur and repeat BMP w/ Magnesium.  Orders placed.

## 2018-04-30 NOTE — Discharge Summary (Signed)
Physician Discharge Summary  Patient ID: Brenda Carney MRN: [id number] DOB/AGE: 08-29-1951 68 y.o.  Admit date: 04/28/2018 Discharge date: 04/30/2018  Admission Diagnoses:  Discharge Diagnoses:  Principal Problem:   Hematemesis (Upper GI bleed). Active Problems:   TIA (transient ischemic attack)   Tobacco abuse   Essential hypertension   Hyperlipidemia with target LDL less than 70   Coronary artery disease, non-occlusive   GERD (gastroesophageal reflux disease)   Depression   COPD exacerbation (HCC)   Sepsis (HCC)   Hypokalemia   AKI (acute kidney injury) ([place])   Acute respiratory failure with hypoxia ([place])   Discharged Condition: stable  Hospital Course: Patient is a 67 year old female with past medical history significant for hypertension, hyperlipidemia, COPD, TIA, GERD, depression, anxiety, cerebral aneurysm, coronary artery disease, IBS, PAD, thoracic aortic stenosis, OSA not on CPAP, tobacco abuse.  Patient was admitted with hematemesis.  Apparently, patient was taking a lot of NSAIDs (Aleve) prior to admission.  EGD done revealed two non-bleeding (One small and one medium sized) superficial clean-based gastric ulcers with no stigmata of bleeding were found in the prepyloric region of the stomach.    Bleeding has resolved.  The patient be discharged back home on PPI.  As per GI team recommendation, the patient should be on Protonix 40 mg p.o. twice daily for 2 months.  Aspirin will be on hold until restarted by the primary care provider the GI team.  Patient will need repeat EGD in 2 months.  Patient has been cleared for discharge by the GI team.  The patient need to follow with GI, primary care provider within 1 to 2 weeks of discharge.  Hematemesis/upper GI bleed: Kindly see above.  Hemoglobin has ranged from 10.2 to 11g/dL during the hospital stay.  Patient's hemoglobin and hematocrit have remained stable.  Patient is also hemodynamically stable.    Acute respiratory failure  with hypoxia due to COPD exacerbation and sepsis:  Patient was managed with nebulizer treatment and azithromycin.  Azithromycin will not be continued on discharge.  Patient's respiratory symptoms have improved significantly.  She is back to her baseline.  Tobacco abuse: Counseled to quit.  Hypertension: Patient was actually hypotensive on admission.  Antihypertensives were held.  Patient also has significant hypokalemia.  HCTZ will be discontinued.  Norvasc will be decreased to 2.5 mg p.o. once daily.  Lisinopril has been discontinued as well.  The primary care provider should kindly monitor the patient's blood pressure closely and adjust medications accordingly.    Hx of TIA (transient ischemic attack): -Aspirin is on hold for now.  PCP or GI team to decide when to restart aspirin.   -continue statin  HLD: -Pravastatin and TriCor  Coronary artery disease, non-occlusive:  -Stable.    GERD: -Patient will be discharged on Protonix 40 mg p.o. twice daily.    Depression and anxiety: Stable.  Hypokalemia: Potassium was 2.8 on admission.  Potassium was repleted during the hospital stay.  HCTZ has been discontinued.  I will need to continue to monitor renal function and electrolytes closely.  Acute kidney injury: This likely prerenal.  AKI has resolved.  On admission patient, patient's serum creatinine was 1.6.  Serum creatinine was was down to 0.85 prior to discharge.  Consults: GI  Significant Diagnostic Studies: labs:   Treatments: procedures: Patient underwent EGD.  EGD revealed non-bleeding (One small and one medium sized) superficial clean-based gastric ulcers with no stigmata of bleeding were found in the prepyloric region of the stomach.  Discharge  Exam: Blood pressure 108/90, pulse 87, temperature 98.2 F (36.8 C), temperature source Oral, resp. rate 19, height 5\' 5"  (1.651 m), weight 68 kg (150 lb), SpO2 100 %.   Disposition: Discharge disposition: 01-Home or Self  Care   Discharge Instructions    Call MD for:   Complete by:  As directed    Please call MD if your symptoms worsen.   Diet - low sodium heart healthy   Complete by:  As directed    Discharge instructions   Complete by:  As directed    Aspirin is currently on hold.  Kindly resume aspirin when directed by your Primary care provider or GI physician.  Avoid NSAIDs.   Increase activity slowly   Complete by:  As directed      Allergies as of 04/30/2018      Reactions   Penicillins Swelling   TONGUE SWELLING PATIENT HAD A PCN REACTION WITH IMMEDIATE RASH, FACIAL/TONGUE/THROAT SWELLING, SOB, OR LIGHTHEADEDNESS WITH HYPOTENSION:  #  #  #  YES  #  #  #   Has patient had a PCN reaction causing severe rash involving mucus membranes or skin necrosis: No Has patient had a PCN reaction that required hospitalization: No Has patient had a PCN reaction occurring within the last 10 years: No   Lyrica [pregabalin] Other (See Comments)   DRY MOUTH      Medication List    STOP taking these medications   aspirin EC 81 MG tablet   CoQ-10 200 MG Caps   EXCEDRIN PO   KRILL OIL PO   lisinopril-hydrochlorothiazide 20-12.5 MG tablet Commonly known as:  PRINZIDE,ZESTORETIC   traMADol 50 MG tablet Commonly known as:  ULTRAM     TAKE these medications   ALPRAZolam 1 MG tablet Commonly known as:  XANAX Take 1 mg by mouth at bedtime as needed for anxiety.   amLODipine 2.5 MG tablet Commonly known as:  NORVASC Take 1 tablet (2.5 mg total) by mouth daily. What changed:    medication strength  how much to take   ARIPiprazole 2 MG tablet Commonly known as:  ABILIFY Take 2 mg by mouth daily.   buPROPion 150 MG 24 hr tablet Commonly known as:  WELLBUTRIN XL Take 150 mg by mouth daily.   desvenlafaxine 100 MG 24 hr tablet Commonly known as:  PRISTIQ Take 100 mg by mouth daily.   Evolocumab 140 MG/ML Soaj Commonly known as:  REPATHA SURECLICK Inject 416 mg into the skin every 14  (fourteen) days.   fenofibrate 145 MG tablet Commonly known as:  TRICOR Take 145 mg by mouth daily.   ferrous sulfate 325 (65 FE) MG tablet Take 1 tablet (325 mg total) by mouth daily.   mometasone 50 MCG/ACT nasal spray Commonly known as:  NASONEX Place 2 sprays into the nose daily as needed (allergies).   multivitamin with minerals Tabs tablet Take 1 tablet by mouth daily.   pantoprazole 40 MG tablet Commonly known as:  PROTONIX Take 1 tablet (40 mg total) by mouth 2 (two) times daily.   Pitavastatin Calcium 4 MG Tabs Take 4 mg by mouth daily.   potassium chloride 10 MEQ tablet Commonly known as:  K-DUR,KLOR-CON Take 10 mEq by mouth daily.   SOOTHE OP Apply 1 drop to eye daily as needed (dry eyes).   zolpidem 5 MG tablet Commonly known as:  AMBIEN Take 1 tablet (5 mg total) by mouth at bedtime. What changed:    medication strength  how much to take        Signed: Bonnell Public 04/30/2018, 11:28 AM

## 2018-04-30 NOTE — Progress Notes (Signed)
[facility] Gastroenterology Progress Note  Brenda Carney 67 y.o. Sep 29, 1951  CC:  Upper GI bleed   Subjective: feeling better today. Denied further bleeding episodes. Denies diarrhea or constipation.   Objective: Vital signs in last 24 hours: Vitals:   04/30/18 0723 04/30/18 0844  BP: 108/90   Pulse: 87   Resp: 19   Temp: 98.2 F (36.8 C)   SpO2: 98% 100%    Physical Exam:  Gen. Alert, oriented 3. Not in acute distress Abdomen. Soft, nontender, nondistended, bowel sounds present. Lower extremity. No edema.  Lab Results: Recent Labs    04/29/18 0224 04/29/18 0840 04/29/18 2102  NA  --  141 142  K  --  2.8* 2.9*  CL  --  109 109  CO2  --  23 23  GLUCOSE  --  114* 91  BUN  --  34* 18  CREATININE  --  1.01* 0.85  CALCIUM  --  8.2* 8.4*  MG 1.5*  --  1.8   Recent Labs    04/28/18 2243  AST 28  ALT 21  ALKPHOS 46  BILITOT 0.6  PROT 6.1*  ALBUMIN 3.8   Recent Labs    04/29/18 1827 04/30/18 0809  WBC 10.9* 8.1  HGB 10.5* 11.0*  HCT 30.5* 32.9*  MCV 92.7 92.7  PLT 316 352   Recent Labs    04/28/18 2252  LABPROT 13.3  INR 1.02      Assessment/Plan: - upper GI bleed. EGD yesterday showed 2 small clean-based gastric ulcers. Hemoglobin stable now. No further bleeding episodes. - Acute blood loss anemia. Hemoglobin stable.  Recommendations ------------------------- - advance diet to regular diet. - Continue oral PPI twice a day for 2 months. - Recommend repeat EGD in 2 months to document healing of gastric ulcers. - Okay to discharge from GI standpoint. - Follow-up with primary GI Dr. Thana Farr or with [facility] GI in 6 weeks. - GI will sign off. Call us back if needed   Otis Brace MD, [place] 04/30/2018, 10:10 AM  Contact #  [phone]

## 2018-04-30 NOTE — Progress Notes (Signed)
Discharged via [place] to POV for discharge to home with friend.

## 2018-04-30 NOTE — Progress Notes (Signed)
Discharge instructions given and understanding verbalized.  Denies questions.

## 2018-04-30 NOTE — Progress Notes (Signed)
Visited with patient-She had become sick and had to go to ED.  Her husband died and does not have other relatives around.  She has 3 dogs and lots of horses that she cares for and misses them very much. Visited with her and gave her opportunity to share any concerns and prayers.  Prayers for speedy recovery and for all staff caring for her.Conard Novak, Chaplain   04/30/18 1000  Clinical Encounter Type  Visited With Patient  Visit Type Initial;Spiritual support  Referral From Physician  Consult/Referral To Chaplain  Spiritual Encounters  Spiritual Needs Prayer;Emotional  Stress Factors  Patient Stress Factors Health changes  Family Stress Factors Other (Comment) (has dogs and horses-concerned about being away)

## 2018-05-01 DIAGNOSIS — M47817 Spondylosis without myelopathy or radiculopathy, lumbosacral region: Secondary | ICD-10-CM | POA: Diagnosis not present

## 2018-05-01 DIAGNOSIS — G894 Chronic pain syndrome: Secondary | ICD-10-CM | POA: Diagnosis not present

## 2018-05-01 DIAGNOSIS — Z79891 Long term (current) use of opiate analgesic: Secondary | ICD-10-CM | POA: Diagnosis not present

## 2018-05-01 DIAGNOSIS — M533 Sacrococcygeal disorders, not elsewhere classified: Secondary | ICD-10-CM | POA: Diagnosis not present

## 2018-05-01 DIAGNOSIS — Z79899 Other long term (current) drug therapy: Secondary | ICD-10-CM | POA: Diagnosis not present

## 2018-05-01 DIAGNOSIS — M961 Postlaminectomy syndrome, not elsewhere classified: Secondary | ICD-10-CM | POA: Diagnosis not present

## 2018-05-03 DIAGNOSIS — J449 Chronic obstructive pulmonary disease, unspecified: Secondary | ICD-10-CM | POA: Diagnosis not present

## 2018-05-03 DIAGNOSIS — E876 Hypokalemia: Secondary | ICD-10-CM | POA: Diagnosis not present

## 2018-05-03 DIAGNOSIS — K922 Gastrointestinal hemorrhage, unspecified: Secondary | ICD-10-CM | POA: Diagnosis not present

## 2018-05-03 DIAGNOSIS — I10 Essential (primary) hypertension: Secondary | ICD-10-CM | POA: Diagnosis not present

## 2018-05-04 LAB — CULTURE, BLOOD (ROUTINE X 2)
Culture: NO GROWTH
Culture: NO GROWTH
Special Requests: ADEQUATE

## 2018-05-07 ENCOUNTER — Other Ambulatory Visit: Payer: Self-pay

## 2018-05-07 ENCOUNTER — Emergency Department (HOSPITAL_COMMUNITY): Payer: Medicare Other

## 2018-05-07 ENCOUNTER — Emergency Department (HOSPITAL_COMMUNITY)
Admission: EM | Admit: 2018-05-07 | Discharge: 2018-05-07 | Disposition: A | Discharge status: Home / Self Care | Payer: Medicare Other | Attending: Emergency Medicine | Admitting: Emergency Medicine

## 2018-05-07 ENCOUNTER — Encounter (HOSPITAL_COMMUNITY): Payer: Self-pay | Admitting: Emergency Medicine

## 2018-05-07 DIAGNOSIS — F17210 Nicotine dependence, cigarettes, uncomplicated: Secondary | ICD-10-CM | POA: Insufficient documentation

## 2018-05-07 DIAGNOSIS — Y999 Unspecified external cause status: Secondary | ICD-10-CM | POA: Insufficient documentation

## 2018-05-07 DIAGNOSIS — X58XXXA Exposure to other specified factors, initial encounter: Secondary | ICD-10-CM | POA: Insufficient documentation

## 2018-05-07 DIAGNOSIS — S161XXA Strain of muscle, fascia and tendon at neck level, initial encounter: Secondary | ICD-10-CM

## 2018-05-07 DIAGNOSIS — Z79899 Other long term (current) drug therapy: Secondary | ICD-10-CM | POA: Diagnosis not present

## 2018-05-07 DIAGNOSIS — I10 Essential (primary) hypertension: Secondary | ICD-10-CM | POA: Insufficient documentation

## 2018-05-07 DIAGNOSIS — S39012A Strain of muscle, fascia and tendon of lower back, initial encounter: Secondary | ICD-10-CM | POA: Insufficient documentation

## 2018-05-07 DIAGNOSIS — Y929 Unspecified place or not applicable: Secondary | ICD-10-CM | POA: Diagnosis not present

## 2018-05-07 DIAGNOSIS — Y939 Activity, unspecified: Secondary | ICD-10-CM | POA: Insufficient documentation

## 2018-05-07 DIAGNOSIS — J449 Chronic obstructive pulmonary disease, unspecified: Secondary | ICD-10-CM | POA: Diagnosis not present

## 2018-05-07 DIAGNOSIS — M545 Low back pain: Secondary | ICD-10-CM | POA: Diagnosis not present

## 2018-05-07 DIAGNOSIS — I2510 Atherosclerotic heart disease of native coronary artery without angina pectoris: Secondary | ICD-10-CM | POA: Diagnosis not present

## 2018-05-07 DIAGNOSIS — S199XXA Unspecified injury of neck, initial encounter: Secondary | ICD-10-CM | POA: Diagnosis present

## 2018-05-07 DIAGNOSIS — M542 Cervicalgia: Secondary | ICD-10-CM | POA: Diagnosis not present

## 2018-05-07 MED ORDER — METHOCARBAMOL 750 MG PO TABS: 750.0000 mg | ORAL_TABLET | Freq: Three times a day (TID) | ORAL | 0 refills | Status: DC | PRN

## 2018-05-07 MED ORDER — METHOCARBAMOL 500 MG PO TABS
750.0000 mg | ORAL_TABLET | Freq: Once | ORAL | Status: AC
  Administered 2018-05-07: 750 mg via ORAL
  Filled 2018-05-07: qty 2

## 2018-05-07 MED ORDER — PREDNISONE 20 MG PO TABS: ORAL_TABLET | ORAL | 0 refills | Status: DC

## 2018-05-07 MED ORDER — PREDNISONE 20 MG PO TABS
60.0000 mg | ORAL_TABLET | Freq: Once | ORAL | Status: AC
  Administered 2018-05-07: 60 mg via ORAL
  Filled 2018-05-07: qty 3

## 2018-05-07 NOTE — ED Provider Notes (Signed)
[facility] DEPT Provider Note   CSN: [id number] Arrival date & time: 05/07/18  0037     History   Chief Complaint Chief Complaint  Patient presents with  . Back Pain    HPI Brenda Carney is a 67 y.o. female.  Patient with hx ddd, c/o posterior neck/trapezius and low back pain. States feels may have strained areas. Pain is dull, moderate-severe, constant, worse w certain movements and position changes. Denies fever or chills. No numbness/weakness. Occasionally radiates towards shoulders and arms.  States does not have appt with her spine specialist, Dr Coralie Common, for another 1-2 months. Takes ultram prn but not helping her pain.   The history is provided by the patient.  Back Pain   Pertinent negatives include no chest pain, no fever, no numbness, no abdominal pain and no weakness.    Past Medical History:  Diagnosis Date  . Anemia    as a younger woman  . Arthritis    "thumbs; hips" (05/14/2017)  . 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 Cerebrovascular disease    Carotid dopplers which showed no significant increase in velocities, extremely minimal on the left, antegrade vertebral bilaterally.  . Cervical spondylosis   . Chronic lower back pain   . COPD (chronic obstructive pulmonary disease) ([place])    "little bit" (05/14/2017)  . Coronary artery disease, non-occlusive    Coronary calcium scoring: June 2018: Score 1106. 98 percentile for age -[place]  . Depression   . Exercise-induced asthma with acute exacerbation    "only in very hot weather" (05/14/2017)  . GERD (gastroesophageal reflux disease)   . YWVPXTGG(269.4)    "monthly" (05/14/2017)  . History of IBS    resovlved  . History of kidney stones   . 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 Hyperlipidemia with target LDL less than 70    Mostly statin intolerant,. Currently on Livalo 4  mg  . Hypertension, benign   . Lumbosacral spondylosis   . Migraine headache    "were monthly; none recently" (05/14/2017)  . PAD (peripheral artery disease) (HCC)    Status post right above-the-knee-below the knee popliteal artery bypass; postop right ABI 0.96.  Marland Kitchen Pneumonia "several times"  . Sleep apnea    does not wear CPAP; "think it was medication related" (05/14/2017)  . Thoracic aortic ectasia (HCC)    ~40 mm on Coronary Calcium Score CT - recommend f/u CTA or MRA  . TIA (transient ischemic attack) 09/2011   "several"    Patient Active Problem List   Diagnosis Date Noted  . Sepsis ([place]) 04/29/2018  . Hematemesis 04/29/2018  . Hypokalemia 04/29/2018  . AKI (acute kidney injury) ([place]) 04/29/2018  . Acute respiratory failure with hypoxia ([place]) 04/29/2018  . Thoracic aortic ectasia ([place])   . Sleep apnea   . Pneumonia   . Migraine headache   . Lumbosacral spondylosis   . Hx of multiple concussions   . History of kidney stones   . History of IBS   . GERD (gastroesophageal reflux disease)   . Exercise-induced asthma with acute exacerbation   . Depression   . COPD exacerbation ([place])   . Chronic lower back pain   . Cervical spondylosis   . Cerebral aneurysm   . Arthritis   . Anemia   . Coronary artery disease, non-occlusive 06/28/2017  . Critical lower limb  ischemia 05/14/2017  . Preoperative cardiovascular examination   . PAD (peripheral artery disease) ([place])   . S/P lumbar spinal fusion 09/30/2014  . Essential hypertension   . Hyperlipidemia with target LDL less than 70   . Cerebrovascular disease   . Headache(784.0) 10/07/2013  . TIA (transient ischemic attack) 09/05/2013  . Tobacco abuse 09/05/2013    Past Surgical History:  Procedure Laterality Date  . ABDOMINAL AORTOGRAM W/LOWER EXTREMITY N/A 05/14/2017   Procedure: Abdominal Aortogram w/Lower Extremity;  Surgeon: Conrad La Quinta, MD;  Location: [facility] LAB;  Service: Cardiovascular;  Laterality: N/A;  .  ACROMIO-CLAVICULAR JOINT REPAIR Left 1990s   "I think it was called an AC joint removal"  . BACK SURGERY    . BYPASS GRAFT POPLITEAL TO POPLITEAL Right 05/15/2017   Procedure: BYPASS GRAFT ABOVE KNEE POPLITEAL TO BELOW KNEE POPLITEAL ARTERY;  Surgeon: Conrad Pullman, MD;  Location[facility];  Service: Vascular;  Laterality: Right;  . ESOPHAGOGASTRODUODENOSCOPY (EGD) WITH PROPOFOL N/A 04/29/2018   Procedure: ESOPHAGOGASTRODUODENOSCOPY (EGD) WITH PROPOFOL;  Surgeon: Otis Brace, MD;  Location: [facility];  Service: Gastroenterology;  Laterality: N/A;  . FOOT FRACTURE SURGERY Bilateral    multiple procedures  . FRACTURE SURGERY    . MAXIMUM ACCESS (MAS)POSTERIOR LUMBAR INTERBODY FUSION (PLIF) 1 LEVEL N/A 09/30/2014   Procedure: FOR MAXIMUM ACCESS (MAS) POSTERIOR LUMBAR INTERBODY FUSION (PLIF) 1 LEVEL;  Surgeon: Eustace Moore, MD;  Location: [facility];  Service: Neurosurgery;  Laterality: N/A;  FOR MAXIMUM ACCESS (MAS) POSTERIOR LUMBAR INTERBODY FUSION (PLIF) 1 LEVEL LUMBAR 4-5  . MULTIPLE TOOTH EXTRACTIONS Right 04/2017  . NM MYOVIEW LTD  06/2017    Normal EF, 66%. No ST changes. Normal study. LOW RISK.  Marland Kitchen TRANSTHORACIC ECHOCARDIOGRAM  09/07/2013   Done to evaluate TIA: Normal LV size and function. EF 55-60%. GR 1 DD. Mild left atrial dilation. Mildly calcified mitral leaflets. Otherwise normal.     OB History   None      Home Medications    Prior to Admission medications   Medication Sig Start Date End Date Taking? Authorizing Provider  ALPRAZolam Duanne Moron) 1 MG tablet Take 1 mg by mouth at bedtime as needed for anxiety.    [provider]  amLODipine (NORVASC) 2.5 MG tablet Take 1 tablet (2.5 mg total) by mouth daily. 04/30/18   Dana Allan I, MD  ARIPiprazole (ABILIFY) 2 MG tablet Take 2 mg by mouth daily.    [provider]  budesonide-formoterol (SYMBICORT) 160-4.5 MCG/ACT inhaler Inhale 2 puffs into the lungs 2 (two) times daily. 04/30/18   Dana Allan I,  MD  buPROPion (WELLBUTRIN XL) 150 MG 24 hr tablet Take 150 mg by mouth daily. 04/04/18   [provider]  desvenlafaxine (PRISTIQ) 100 MG 24 hr tablet Take 100 mg by mouth daily.    [provider]  Evolocumab (REPATHA SURECLICK) 865 MG/ML SOAJ Inject 140 mg into the skin every 14 (fourteen) days. 06/07/17   Leonie Man, MD  fenofibrate (TRICOR) 145 MG tablet Take 145 mg by mouth daily.  08/23/15   [provider]  ferrous sulfate 325 (65 FE) MG tablet Take 1 tablet (325 mg total) by mouth daily. 04/30/18 04/30/19  Dana Allan I, MD  Ipratropium-Albuterol (COMBIVENT RESPIMAT) 20-100 MCG/ACT AERS respimat Inhale 1 puff into the lungs every 6 (six) hours as needed for wheezing. 04/30/18   Bonnell Public, MD  mometasone (NASONEX) 50 MCG/ACT nasal spray Place 2 sprays into the nose  daily as needed (allergies).    [provider]  Multiple Vitamin (MULTIVITAMIN WITH MINERALS) TABS tablet Take 1 tablet by mouth daily.    [provider]  pantoprazole (PROTONIX) 40 MG tablet Take 1 tablet (40 mg total) by mouth 2 (two) times daily. 04/30/18   Bonnell Public, MD  Pitavastatin Calcium 4 MG TABS Take 4 mg by mouth daily.  06/07/17   Leonie Man, MD  potassium chloride (K-DUR,KLOR-CON) 10 MEQ tablet Take 10 mEq by mouth daily.    [provider]  Propylene Glycol-Glycerin (SOOTHE OP) Apply 1 drop to eye daily as needed (dry eyes).    [provider]  tiotropium (SPIRIVA HANDIHALER) 18 MCG inhalation capsule Place 1 capsule (18 mcg total) into inhaler and inhale daily. 04/30/18 04/30/19  Dana Allan I, MD  zolpidem (AMBIEN) 5 MG tablet Take 1 tablet (5 mg total) by mouth at bedtime. 04/30/18   Bonnell Public, MD    Family History Family History  Problem Relation Age of Onset  . Stroke Father   . Heart attack Mother   . Stroke Mother     Social History Social History   Tobacco Use  . Smoking status: Light Tobacco Smoker     Packs/day: 0.02    Years: 36.00    Pack years: 0.72    Types: Cigarettes    Last attempt to quit: 04/24/2017    Years since quitting: 1.0  . Smokeless tobacco: Never Used  Substance Use Topics  . Alcohol use: Yes    Alcohol/week: 1.8 oz    Types: 3 Glasses of wine per week  . Drug use: No     Allergies   Penicillins and Lyrica [pregabalin]   Review of Systems Review of Systems  Constitutional: Negative for chills and fever.  HENT: Negative for sore throat.   Eyes: Negative for redness.  Respiratory: Negative for cough and shortness of breath.   Cardiovascular: Negative for chest pain.  Gastrointestinal: Negative for abdominal pain.  Genitourinary: Negative for flank pain.  Musculoskeletal: Positive for back pain and neck pain.  Skin: Negative for rash.  Neurological: Negative for weakness and numbness.  Hematological: Does not bruise/bleed easily.  Psychiatric/Behavioral: Negative for confusion.     Physical Exam Updated Vital Signs BP (!) 139/91 (BP Location: Right Arm)   Pulse (!) 106   Temp 98.6 F (37 C) (Oral)   Resp 18   SpO2 100%   Physical Exam  Constitutional: She appears well-developed and well-nourished. No distress.  HENT:  Mouth/Throat: Oropharynx is clear and moist.  Eyes: Conjunctivae are normal. No scleral icterus.  Neck: Neck supple. No tracheal deviation present.  Cardiovascular: Normal rate, regular rhythm, normal heart sounds and intact distal pulses.  Pulmonary/Chest: Effort normal and breath sounds normal. No respiratory distress.  Abdominal: Normal appearance. She exhibits no distension.  Genitourinary:  Genitourinary Comments: No cva tenderness  Musculoskeletal: She exhibits no edema.  Cervical and lumbar diffuse tenderness. Spine aligned, no step off. bil trapezius muscular tenderness.   Neurological: She is alert.  Speech clear/fluent. Motor intact bil ext. sens grossly intact. Steady gait.   Skin: Skin is warm and dry. No rash  noted. She is not diaphoretic.  Psychiatric: She has a normal mood and affect.  Nursing note and vitals reviewed.    ED Treatments / Results  Labs (all labs ordered are listed, but only abnormal results are displayed) Labs Reviewed - No data to display  EKG None  Radiology Dg  Cervical Spine Complete  Result Date: 05/07/2018 CLINICAL DATA:  Cervicalgia EXAM: CERVICAL SPINE - COMPLETE 4+ VIEW COMPARISON:  December 11, 2014 FINDINGS: Frontal, lateral, open-mouth odontoid, and bilateral oblique views were obtained. There is no evident fracture. There is 4 mm of anterolisthesis of C2 on C3. There is 4 mm of C3 on C4. There is 3 mm of anterolisthesis of C5 on C6. No other spondylolisthesis evident. Prevertebral soft tissues and predental space regions are normal. There is marked disc space narrowing at C5-6 and C6-7. There is moderate disc space narrowing at C4-5. There is facet hypertrophy with exit foraminal narrowing at all levels bilaterally except at C2-3 on the right. Lung apices are clear. There is calcification in each carotid artery, more on the left than on the right. IMPRESSION: Extensive multilevel osteoarthritic change. Spondylolisthesis at C2-3, C3-4, and C4-5 is noted. The spondylolisthesis at C2-3 is new compared to prior study. There has been overall progression of osteoarthritic change compared to the previous study. No acute fracture evident. There is carotid artery calcification bilaterally, more on the left than on the right. Electronically Signed   By: Lowella Grip III M.D.   On: 05/07/2018 08:01   Dg Lumbar Spine Complete  Result Date: 05/07/2018 CLINICAL DATA:  Lumbago EXAM: LUMBAR SPINE - COMPLETE 4+ VIEW COMPARISON:  April 03, 2018 FINDINGS: Frontal, lateral, spot lumbosacral lateral, and bilateral oblique views were obtained. There are 5 non-rib-bearing lumbar type vertebral bodies. The patient has had posterior screw and plate fixation at L4 and L5, stable. There is a  disc spacer at L4-5. there is no fracture. There is 3 mm of anterolisthesis of L5 on S1, stable. No new spondylolisthesis. There is moderate disc space narrowing at L4-5. There is slight disc space narrowing at L5-S1. These changes are stable. There is facet osteoarthritic change at L4-5 and L5-S1 bilaterally. There is aortoiliac atherosclerosis. IMPRESSION: Postoperative change at L4-L5, stable. Mild anterolisthesis of L5 on S1 is stable. No fracture or new spondylolisthesis. Osteoarthritic change in the lower lumbar region is stable. There is aortoiliac atherosclerosis. Aortic Atherosclerosis (ICD10-I70.0). Electronically Signed   By: Lowella Grip III M.D.   On: 05/07/2018 08:03    Procedures Procedures (including critical care time)  Medications Ordered in ED Medications  predniSONE (DELTASONE) tablet 60 mg (has no administration in time range)  methocarbamol (ROBAXIN) tablet 750 mg (has no administration in time range)     Initial Impression / Assessment and Plan / ED Course  I have reviewed the triage vital signs and the nursing notes.  Pertinent labs & imaging results that were available during my care of the patient were reviewed by me and considered in my medical decision making (see chart for details).  Prednisone po. Robaxin po.   Imaging ordered.  Reviewed nursing notes and prior charts for additional history.   xrays reviewed - significant deg changes noted - discussed w pt.   Pt appears more comfortable. Nad. Discussed f/u plan. Return precautions provided.   Final Clinical Impressions(s) / ED Diagnoses   Final diagnoses:  None    ED Discharge Orders    None       Lajean Saver, MD 05/08/18 1203

## 2018-05-07 NOTE — Discharge Instructions (Addendum)
It was our pleasure to provide your ER care today - we hope that you feel better.  Your neck xrays were read as showing:  Extensive multilevel osteoarthritic change. Spondylolisthesis at C2-3, C3-4, and C4-5 is noted. The spondylolisthesis at C2-3 is new compared to prior study. There has been overall progression of osteoarthritic change compared to the previous study. No acute fracture evident.  There is carotid artery calcification bilaterally, more on the left than on the right.  Follow up with your spine specialist in the next couple weeks - call office to see if  they have any cancellations and/or if they can otherwise move up your appointment.  Have Dr Ronnald Ramp review your films - discuss possible need for further advanced imaging.   As relates carotid calcifications noted on plain xrays - discuss with your primary care doctor - discuss possible outpatient ultrasound.   Try heat therapy/heating pad to sore areas. Avoid bending at waist or heavy lifting. Take prednisone as prescribed. Take robaxin as need for muscle spasm. Take your ultram as need for pain. No driving when taking the robaxin/ultram as they can cause drowsiness.   Return to ER if worse, new symptoms, loss of sensation and/or weakness, other concern.

## 2018-05-07 NOTE — ED Triage Notes (Signed)
Pt brought in by EMS for c/o lower back pain that is normal for her but  This morning when she woke up the pain is worse and it is causing her ROM to her neck to be less than normal and it is causing her to have nausea and migraines   Pt more comfortable sitting than laying down

## 2018-05-07 NOTE — ED Triage Notes (Signed)
Comfort measures performed  Laid chair flat so pt could lay down  Explained delay to pt

## 2018-05-14 DIAGNOSIS — M5417 Radiculopathy, lumbosacral region: Secondary | ICD-10-CM | POA: Diagnosis not present

## 2018-05-17 ENCOUNTER — Other Ambulatory Visit: Payer: Self-pay | Admitting: Pharmacist Clinician (PhC)/ Clinical Pharmacy Specialist

## 2018-05-17 ENCOUNTER — Telehealth: Payer: Self-pay | Admitting: Cardiology

## 2018-05-17 DIAGNOSIS — Z8711 Personal history of peptic ulcer disease: Secondary | ICD-10-CM | POA: Diagnosis not present

## 2018-05-17 DIAGNOSIS — G894 Chronic pain syndrome: Secondary | ICD-10-CM | POA: Diagnosis not present

## 2018-05-17 DIAGNOSIS — M62830 Muscle spasm of back: Secondary | ICD-10-CM | POA: Diagnosis not present

## 2018-05-17 DIAGNOSIS — E785 Hyperlipidemia, unspecified: Secondary | ICD-10-CM

## 2018-05-17 NOTE — Telephone Encounter (Signed)
Returned call to patient.Pharmacy at [facility] office will leave repatha samples in their fridge for pick up.

## 2018-05-17 NOTE — Telephone Encounter (Signed)
Patient calling the office for samples of medication:   1.  What medication and dosage are you requesting samples for?Repatha 140mg   2.  Are you currently out of this medication? yes

## 2018-05-23 ENCOUNTER — Other Ambulatory Visit: Payer: Self-pay

## 2018-05-23 ENCOUNTER — Telehealth: Payer: Self-pay | Admitting: *Deleted

## 2018-05-23 MED ORDER — EVOLOCUMAB 140 MG/ML ~~LOC~~ SOAJ: 140.0000 mg | SUBCUTANEOUS | 11 refills | Status: DC

## 2018-05-23 NOTE — Telephone Encounter (Signed)
Patient called c/o debilitating buttock pain. States she googled this and needs to see Dr. Bridgett Larsson because this is symptom of a blockage. Patient has a long and significant Hx. Of back pain and back surgery. Denies any recent fall or injury. Was at [facility] ED 5/14 "intact distal pulse" per ED physician. She has appointment with Dr. Ronnald Ramp (back doctor) on Monday. I encouraged her to call that office to see if she can get in sooner or [place] for  work up to have this service contacted if vascular needed.

## 2018-05-27 DIAGNOSIS — M5416 Radiculopathy, lumbar region: Secondary | ICD-10-CM | POA: Diagnosis not present

## 2018-05-27 DIAGNOSIS — Z8719 Personal history of other diseases of the digestive system: Secondary | ICD-10-CM | POA: Diagnosis not present

## 2018-05-27 DIAGNOSIS — D500 Iron deficiency anemia secondary to blood loss (chronic): Secondary | ICD-10-CM | POA: Diagnosis not present

## 2018-05-29 DIAGNOSIS — M4807 Spinal stenosis, lumbosacral region: Secondary | ICD-10-CM | POA: Diagnosis not present

## 2018-05-29 DIAGNOSIS — M47817 Spondylosis without myelopathy or radiculopathy, lumbosacral region: Secondary | ICD-10-CM | POA: Diagnosis not present

## 2018-05-29 DIAGNOSIS — M79606 Pain in leg, unspecified: Secondary | ICD-10-CM | POA: Diagnosis not present

## 2018-05-29 DIAGNOSIS — M5416 Radiculopathy, lumbar region: Secondary | ICD-10-CM | POA: Diagnosis not present

## 2018-05-29 DIAGNOSIS — M5117 Intervertebral disc disorders with radiculopathy, lumbosacral region: Secondary | ICD-10-CM | POA: Diagnosis not present

## 2018-05-29 DIAGNOSIS — G894 Chronic pain syndrome: Secondary | ICD-10-CM | POA: Diagnosis not present

## 2018-06-04 DIAGNOSIS — G4730 Sleep apnea, unspecified: Secondary | ICD-10-CM | POA: Diagnosis not present

## 2018-06-04 DIAGNOSIS — F39 Unspecified mood [affective] disorder: Secondary | ICD-10-CM | POA: Diagnosis not present

## 2018-06-04 DIAGNOSIS — M5416 Radiculopathy, lumbar region: Secondary | ICD-10-CM | POA: Diagnosis not present

## 2018-06-04 DIAGNOSIS — Z634 Disappearance and death of family member: Secondary | ICD-10-CM | POA: Diagnosis not present

## 2018-06-04 DIAGNOSIS — F4312 Post-traumatic stress disorder, chronic: Secondary | ICD-10-CM | POA: Diagnosis not present

## 2018-06-04 DIAGNOSIS — G4709 Other insomnia: Secondary | ICD-10-CM | POA: Diagnosis not present

## 2018-06-04 DIAGNOSIS — I10 Essential (primary) hypertension: Secondary | ICD-10-CM | POA: Diagnosis not present

## 2018-06-04 DIAGNOSIS — F341 Dysthymic disorder: Secondary | ICD-10-CM | POA: Diagnosis not present

## 2018-06-14 DIAGNOSIS — L821 Other seborrheic keratosis: Secondary | ICD-10-CM | POA: Diagnosis not present

## 2018-06-14 DIAGNOSIS — L728 Other follicular cysts of the skin and subcutaneous tissue: Secondary | ICD-10-CM | POA: Diagnosis not present

## 2018-06-14 DIAGNOSIS — L908 Other atrophic disorders of skin: Secondary | ICD-10-CM | POA: Diagnosis not present

## 2018-06-21 ENCOUNTER — Other Ambulatory Visit: Payer: Self-pay | Admitting: Pharmacist Clinician (PhC)/ Clinical Pharmacy Specialist

## 2018-06-26 DIAGNOSIS — K228 Other specified diseases of esophagus: Secondary | ICD-10-CM | POA: Diagnosis not present

## 2018-06-26 DIAGNOSIS — K259 Gastric ulcer, unspecified as acute or chronic, without hemorrhage or perforation: Secondary | ICD-10-CM | POA: Diagnosis not present

## 2018-06-26 DIAGNOSIS — K2270 Barrett's esophagus without dysplasia: Secondary | ICD-10-CM | POA: Diagnosis not present

## 2018-07-01 DIAGNOSIS — Z79891 Long term (current) use of opiate analgesic: Secondary | ICD-10-CM | POA: Diagnosis not present

## 2018-07-01 DIAGNOSIS — M47817 Spondylosis without myelopathy or radiculopathy, lumbosacral region: Secondary | ICD-10-CM | POA: Diagnosis not present

## 2018-07-01 DIAGNOSIS — M79606 Pain in leg, unspecified: Secondary | ICD-10-CM | POA: Diagnosis not present

## 2018-07-01 DIAGNOSIS — G894 Chronic pain syndrome: Secondary | ICD-10-CM | POA: Diagnosis not present

## 2018-07-01 DIAGNOSIS — M961 Postlaminectomy syndrome, not elsewhere classified: Secondary | ICD-10-CM | POA: Diagnosis not present

## 2018-07-01 DIAGNOSIS — Z79899 Other long term (current) drug therapy: Secondary | ICD-10-CM | POA: Diagnosis not present

## 2018-07-02 DIAGNOSIS — K2270 Barrett's esophagus without dysplasia: Secondary | ICD-10-CM | POA: Diagnosis not present

## 2018-07-09 DIAGNOSIS — M5416 Radiculopathy, lumbar region: Secondary | ICD-10-CM | POA: Diagnosis not present

## 2018-07-25 DIAGNOSIS — M9902 Segmental and somatic dysfunction of thoracic region: Secondary | ICD-10-CM | POA: Diagnosis not present

## 2018-07-25 DIAGNOSIS — M5414 Radiculopathy, thoracic region: Secondary | ICD-10-CM | POA: Diagnosis not present

## 2018-07-27 ENCOUNTER — Ambulatory Visit (INDEPENDENT_AMBULATORY_CARE_PROVIDER_SITE_OTHER): Payer: Medicare Other

## 2018-07-27 ENCOUNTER — Ambulatory Visit (HOSPITAL_COMMUNITY)
Admission: EM | Admit: 2018-07-27 | Discharge: 2018-07-27 | Disposition: A | Discharge status: Home / Self Care | Payer: Medicare Other | Attending: Family Medicine | Admitting: Family Medicine

## 2018-07-27 ENCOUNTER — Encounter (HOSPITAL_COMMUNITY): Payer: Self-pay | Admitting: Emergency Medicine

## 2018-07-27 DIAGNOSIS — R0781 Pleurodynia: Secondary | ICD-10-CM | POA: Diagnosis not present

## 2018-07-27 DIAGNOSIS — M62838 Other muscle spasm: Secondary | ICD-10-CM

## 2018-07-27 DIAGNOSIS — S20211A Contusion of right front wall of thorax, initial encounter: Secondary | ICD-10-CM | POA: Diagnosis not present

## 2018-07-27 NOTE — Discharge Instructions (Addendum)
X-rays did not show sign of acute fracture or dislocation Continue conservative management of rest, ice, and gentle stretches Continue pain medications and muscle relaxer as prescribed.   Follow up with pain management on Monday for further evaluation and management  Return or go to the ER if you have any new or worsening symptoms (fever, chills, SOB, chest pain, abdominal pain, changes in bowel or bladder habits, pain radiating into lower legs, etc...)   Blood pressure elevated.  This could be due to not taking your medication today.  Please recheck in 24 hours and follow up with PCP if blood pressure continues to be elevated or greater than 140/90

## 2018-07-27 NOTE — ED Triage Notes (Signed)
Pt states on Thursday she went to a chiropractor and was adjusted, states ever since then shes had some middle back pain.

## 2018-07-27 NOTE — ED Provider Notes (Signed)
[facility]   [id number] 07/27/18 Arrival Time: 3664  SUBJECTIVE: History from: patient. Brenda Carney is a 67 y.o. female hx significant for chronic back pain complains of right sided rib pain that began 2 days ago.  It began after she was seen by a chiropractor and had her upper back adjusted.  Localizes the pain to the to right side of the mid back.  Describes the pain as constant and spasm.  Has tried oxycodone and muscle relaxer with temporary relief.  Symptoms are made worse with take a deep breath and bending forward.  Reports similar symptoms in the past and diagnosed with dislocated rib.  Denies fever, chills, erythema, ecchymosis, effusion, weakness, urinary symptoms, numbness and tingling.      ROS: As per HPI.  Past Medical History:  Diagnosis Date  . Anemia    as a younger woman  . Arthritis    "thumbs; hips" (05/14/2017)  . 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 Cerebrovascular disease    Carotid dopplers which showed no significant increase in velocities, extremely minimal on the left, antegrade vertebral bilaterally.  . Cervical spondylosis   . Chronic lower back pain   . COPD (chronic obstructive pulmonary disease) ([place])    "little bit" (05/14/2017)  . Coronary artery disease, non-occlusive    Coronary calcium scoring: June 2018: Score 1106. 98 percentile for age -[place]  . Depression   . Exercise-induced asthma with acute exacerbation    "only in very hot weather" (05/14/2017)  . GERD (gastroesophageal reflux disease)   . QIHKVQQV(956.3)    "monthly" (05/14/2017)  . History of IBS    resovlved  . History of kidney stones   . 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 Hyperlipidemia with target LDL less than 70    Mostly statin intolerant,. Currently on Livalo 4 mg  . Hypertension, benign   . Lumbosacral spondylosis   . Migraine headache    "were  monthly; none recently" (05/14/2017)  . PAD (peripheral artery disease) (H[facility])    Status post right above-the-knee-below the knee popliteal artery bypass; postop right ABI 0.96.  Marland Kitchen Pneumonia "several times"  . Sleep apnea    does not wear CPAP; "think it was medication related" (05/14/2017)  . Thoracic aortic ectasia (HCC)    ~40 mm on Coronary Calcium Score CT - recommend f/u CTA or MRA  . TIA (transient ischemic attack) 09/2011   "several"   Past Surgical History:  Procedure Laterality Date  . ABDOMINAL AORTOGRAM W/LOWER EXTREMITY N/A 05/14/2017   Procedure: Abdominal Aortogram w/Lower Extremity;  Surgeon: Conrad Guyton, MD;  Location: [facility];  Service: Cardiovascular;  Laterality: N/A;  . ACROMIO-CLAVICULAR JOINT REPAIR Left 1990s   "I think it was called an AC joint removal"  . BACK SURGERY    . BYPASS GRAFT POPLITEAL TO POPLITEAL Right 05/15/2017   Procedure: BYPASS GRAFT ABOVE KNEE POPLITEAL TO BELOW KNEE POPLITEAL ARTERY;  Surgeon: Conrad Hopewell, MD;  [facility]: [facility] OR;  Service: Vascular;  Laterality: Right;  . ESOPHAGOGASTRODUODENOSCOPY (EGD) WITH PROPOFOL N/A 04/29/2018   Procedure: ESOPHAGOGASTRODUODENOSCOPY (EGD) WITH PROPOFOL;  Surgeon: Otis Brace, MD;  Location: [facility];  Service: Gastroenterology;  Laterality: N/A;  . FOOT FRACTURE SURGERY Bilateral    multiple procedures  . FRACTURE SURGERY    . MAXIMUM ACCESS (MAS)POSTERIOR LUMBAR INTERBODY FUSION (PLIF) 1 LEVEL N/A 09/30/2014  Procedure: FOR MAXIMUM ACCESS (MAS) POSTERIOR LUMBAR INTERBODY FUSION (PLIF) 1 LEVEL;  Surgeon: Eustace Moore, MD;  Location: [facility];  Service: Neurosurgery;  Laterality: N/A;  FOR MAXIMUM ACCESS (MAS) POSTERIOR LUMBAR INTERBODY FUSION (PLIF) 1 LEVEL LUMBAR 4-5  . MULTIPLE TOOTH EXTRACTIONS Right 04/2017  . NM MYOVIEW LTD  06/2017    Normal EF, 66%. No ST changes. Normal study. LOW RISK.  Marland Kitchen TRANSTHORACIC ECHOCARDIOGRAM  09/07/2013   Done to evaluate TIA: Normal LV size and  function. EF 55-60%. GR 1 DD. Mild left atrial dilation. Mildly calcified mitral leaflets. Otherwise normal.   Allergies  Allergen Reactions  . Penicillins Swelling and Other (See Comments)    TONGUE SWELLING  PATIENT HAD A PCN REACTION WITH IMMEDIATE RASH, FACIAL/TONGUE/THROAT SWELLING, SOB, OR LIGHTHEADEDNESS WITH HYPOTENSION:  #  #  #  YES  #  #  #   Has patient had a PCN reaction causing severe rash involving mucus membranes or skin necrosis: No Has patient had a PCN reaction that required hospitalization: No Has patient had a PCN reaction occurring within the last 10 years: No   . Lyrica [Pregabalin] Other (See Comments)    DRY MOUTH   No current facility-administered medications on file prior to encounter.    Current Outpatient Medications on File Prior to Encounter  Medication Sig Dispense Refill  . ALPRAZolam (XANAX) 1 MG tablet Take 1 mg by mouth at bedtime as needed for anxiety.    Marland Kitchen amLODipine (NORVASC) 2.5 MG tablet Take 1 tablet (2.5 mg total) by mouth daily. 30 tablet 0  . ARIPiprazole (ABILIFY) 2 MG tablet Take 2 mg by mouth daily.    . budesonide-formoterol (SYMBICORT) 160-4.5 MCG/ACT inhaler Inhale 2 puffs into the lungs 2 (two) times daily. 1 Inhaler 12  . buPROPion (WELLBUTRIN XL) 150 MG 24 hr tablet Take 150 mg by mouth daily.  1  . desvenlafaxine (PRISTIQ) 100 MG 24 hr tablet Take 100 mg by mouth daily.    . Evolocumab (REPATHA SURECLICK) 664 MG/ML SOAJ Inject 140 mg into the skin every 14 (fourteen) days. 2 pen 11  . fenofibrate (TRICOR) 145 MG tablet Take 145 mg by mouth daily.   1  . ferrous sulfate 325 (65 FE) MG tablet Take 1 tablet (325 mg total) by mouth daily. 30 tablet 3  . Ipratropium-Albuterol (COMBIVENT RESPIMAT) 20-100 MCG/ACT AERS respimat Inhale 1 puff into the lungs every 6 (six) hours as needed for wheezing. 1 Inhaler 0  . methocarbamol (ROBAXIN) 750 MG tablet Take 1 tablet (750 mg total) by mouth 3 (three) times daily as needed for muscle spasms. 20  tablet 0  . mometasone (NASONEX) 50 MCG/ACT nasal spray Place 2 sprays into the nose daily as needed (allergies).    . Multiple Vitamin (MULTIVITAMIN WITH MINERALS) TABS tablet Take 1 tablet by mouth daily.    . mupirocin ointment (BACTROBAN) 2 % Place 1 application into the nose 2 (two) times daily as needed (For wound care.).    Marland Kitchen pantoprazole (PROTONIX) 40 MG tablet Take 1 tablet (40 mg total) by mouth 2 (two) times daily. 60 tablet 0  . Pitavastatin Calcium 4 MG TABS Take 4 mg by mouth daily.  30 tablet 5  . potassium chloride (K-DUR,KLOR-CON) 10 MEQ tablet Take 10 mEq by mouth daily.    . predniSONE (DELTASONE) 20 MG tablet 3 po once a day for 2 days, then 2 po once a day for 3 days, then 1 po once a day  for 3 days 15 tablet 0  . Propylene Glycol-Glycerin (SOOTHE OP) Apply 1 drop to eye daily as needed (dry eyes).    Marland Kitchen tiotropium (SPIRIVA HANDIHALER) 18 MCG inhalation capsule Place 1 capsule (18 mcg total) into inhaler and inhale daily. 30 capsule 2  . zolpidem (AMBIEN) 5 MG tablet Take 1 tablet (5 mg total) by mouth at bedtime. 30 tablet 0   Social History   Socioeconomic History  . Marital status: Widowed    Spouse name: [name]   . Number of children: 0  . Years of education: COLLEGE  . Highest education level: Not on file  Occupational History  . Occupation: Horse Lawyer  . Financial resource strain: Not on file  . Food insecurity:    Worry: Not on file    Inability: Not on file  . Transportation needs:    Medical: Not on file    Non-medical: Not on file  Tobacco Use  . Smoking status: Light Tobacco Smoker    Packs/day: 0.02    Years: 36.00    Pack years: 0.72    Types: Cigarettes    Last attempt to quit: 04/24/2017    Years since quitting: 1.2  . Smokeless tobacco: Never Used  Substance and Sexual Activity  . Alcohol use: Yes    Alcohol/week: 1.8 oz    Types: 3 Glasses of wine per week  . Drug use: No  . Sexual activity: Never  Lifestyle  .  Physical activity:    Days per week: Not on file    Minutes per session: Not on file  . Stress: Not on file  Relationships  . Social connections:    Talks on phone: Not on file    Gets together: Not on file    Attends religious service: Not on file    Active member of club or organization: Not on file    Attends meetings of clubs or organizations: Not on file    Relationship status: Not on file  . Intimate partner violence:    Fear of current or ex partner: Not on file    Emotionally abused: Not on file    Physically abused: Not on file    Forced sexual activity: Not on file  Other Topics Concern  . Not on file  Social History Narrative   Patient lives at home with her husband [name]. Patient has no children.    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    Patient has BA degree.    Smokes 2-3 cigarettes /day -- former heavy smoker (failed "quitting") -- now says she has truly "QUIT" - however now is been going back and forth between quitting and cheating and probably smokes anywhere from 0-3 cigarettes a day.   Family History  Problem Relation Age of Onset  . Stroke Father   . Heart attack Mother   . Stroke Mother     OBJECTIVE:  Vitals:   07/27/18 1812  BP: (!) 169/103  Pulse: 97  Resp: 16  Temp: 98.2 F (36.8 C)  SpO2: 100%    General appearance: AOx3; in no acute distress.  Head: NCAT Lungs: CTA bilaterally; anterior and posterior chest; mild tenderness with AP compression Heart: RRR.  Clear S1 and S2 without murmur, gallops, or rubs.  Radial pulses 2+ bilaterally. Musculoskeletal: Thoracic spine Inspection: Skin warm, dry, clear and intact without obvious erythema, effusion, or ecchymosis.  Palpation: Mildly tender about the right paravertebral  muscles, and along the proximal mid posterior ribs ROM: FROM active and passive Strength: 5/5 shld abduction, 5/5 shld adduction, 5/5 elbow flexion, 5/5 elbow extension, 5/5 grip strength  Skin: warm and  dry Neurologic: Ambulates without difficulty; Sensation intact about the upper extremities Psychological: alert and cooperative; normal mood and affect  DIAGNOSTIC STUDIES:  CLINICAL DATA: Right-sided pain.  EXAM: RIGHT RIBS AND CHEST - 3+ VIEW  COMPARISON: 04/28/2018  FINDINGS: Lungs are hyperexpanded. Interstitial markings are diffusely coarsened with chronic features. No evidence for pneumothorax or pleural effusion. The cardiopericardial silhouette is within normal limits for size.  Oblique views of the right ribs were obtained. Radio-opaque marker has been placed on the skin at the site of patient concern. Nonacute right-sided rib fractures are stable. No evidence for an acute displaced right-sided rib fracture.  IMPRESSION: 1. No evidence for an acute displaced right-sided rib fracture. 2. No right pneumothorax or pleural effusion. 3. Emphysema.   Electronically Signed By: Misty Stanley M.D. On: 07/27/2018 19:21  I have reviewed the x-rays and the radiologist interpretation. I am in agreement with the radiologist interpretation.    ASSESSMENT & PLAN:  1. Contusion of rib on right side, initial encounter   2. Muscle spasm     No orders of the defined types were placed in this encounter.  X-rays did not show sign of acute fracture or dislocation Continue conservative management of rest, ice, and gentle stretches Continue pain medications and muscle relaxer as prescribed.   Follow up with pain management on Monday for further evaluation and management  Return or go to the ER if you have any new or worsening symptoms (fever, chills, SOB, chest pain, abdominal pain, changes in bowel or bladder habits, pain radiating into lower legs, etc...)   Blood pressure elevated.  This could be due to not taking your medication today.  Please recheck in 24 hours and follow up with PCP if blood pressure continues to be elevated or greater than 140/90  Reviewed  expectations re: course of current medical issues. Questions answered. Outlined signs and symptoms indicating need for more acute intervention. Patient verbalized understanding. After Visit Summary given.    Lestine Box, PA-C 07/27/18 1934

## 2018-08-01 DIAGNOSIS — M79606 Pain in leg, unspecified: Secondary | ICD-10-CM | POA: Diagnosis not present

## 2018-08-01 DIAGNOSIS — M47817 Spondylosis without myelopathy or radiculopathy, lumbosacral region: Secondary | ICD-10-CM | POA: Diagnosis not present

## 2018-08-01 DIAGNOSIS — M961 Postlaminectomy syndrome, not elsewhere classified: Secondary | ICD-10-CM | POA: Diagnosis not present

## 2018-08-01 DIAGNOSIS — Z79891 Long term (current) use of opiate analgesic: Secondary | ICD-10-CM | POA: Diagnosis not present

## 2018-08-01 DIAGNOSIS — G894 Chronic pain syndrome: Secondary | ICD-10-CM | POA: Diagnosis not present

## 2018-08-01 DIAGNOSIS — Z79899 Other long term (current) drug therapy: Secondary | ICD-10-CM | POA: Diagnosis not present

## 2018-08-29 DIAGNOSIS — G894 Chronic pain syndrome: Secondary | ICD-10-CM | POA: Diagnosis not present

## 2018-08-29 DIAGNOSIS — M47817 Spondylosis without myelopathy or radiculopathy, lumbosacral region: Secondary | ICD-10-CM | POA: Diagnosis not present

## 2018-08-29 DIAGNOSIS — M5137 Other intervertebral disc degeneration, lumbosacral region: Secondary | ICD-10-CM | POA: Diagnosis not present

## 2018-08-29 DIAGNOSIS — M961 Postlaminectomy syndrome, not elsewhere classified: Secondary | ICD-10-CM | POA: Diagnosis not present

## 2018-09-03 DIAGNOSIS — G4709 Other insomnia: Secondary | ICD-10-CM | POA: Diagnosis not present

## 2018-09-03 DIAGNOSIS — Z634 Disappearance and death of family member: Secondary | ICD-10-CM | POA: Diagnosis not present

## 2018-09-03 DIAGNOSIS — F341 Dysthymic disorder: Secondary | ICD-10-CM | POA: Diagnosis not present

## 2018-09-03 DIAGNOSIS — G4730 Sleep apnea, unspecified: Secondary | ICD-10-CM | POA: Diagnosis not present

## 2018-09-03 DIAGNOSIS — F4312 Post-traumatic stress disorder, chronic: Secondary | ICD-10-CM | POA: Diagnosis not present

## 2018-09-26 DIAGNOSIS — Z79899 Other long term (current) drug therapy: Secondary | ICD-10-CM | POA: Diagnosis not present

## 2018-09-26 DIAGNOSIS — M961 Postlaminectomy syndrome, not elsewhere classified: Secondary | ICD-10-CM | POA: Diagnosis not present

## 2018-09-26 DIAGNOSIS — G894 Chronic pain syndrome: Secondary | ICD-10-CM | POA: Diagnosis not present

## 2018-09-26 DIAGNOSIS — M47817 Spondylosis without myelopathy or radiculopathy, lumbosacral region: Secondary | ICD-10-CM | POA: Diagnosis not present

## 2018-09-26 DIAGNOSIS — Z79891 Long term (current) use of opiate analgesic: Secondary | ICD-10-CM | POA: Diagnosis not present

## 2018-09-26 DIAGNOSIS — M5137 Other intervertebral disc degeneration, lumbosacral region: Secondary | ICD-10-CM | POA: Diagnosis not present

## 2018-10-08 ENCOUNTER — Other Ambulatory Visit: Payer: Self-pay | Admitting: Internal Medicine

## 2018-10-08 DIAGNOSIS — J449 Chronic obstructive pulmonary disease, unspecified: Secondary | ICD-10-CM | POA: Diagnosis not present

## 2018-10-08 DIAGNOSIS — Z1231 Encounter for screening mammogram for malignant neoplasm of breast: Secondary | ICD-10-CM

## 2018-10-08 DIAGNOSIS — E039 Hypothyroidism, unspecified: Secondary | ICD-10-CM | POA: Diagnosis not present

## 2018-10-08 DIAGNOSIS — Z803 Family history of malignant neoplasm of breast: Secondary | ICD-10-CM | POA: Diagnosis not present

## 2018-10-08 DIAGNOSIS — K210 Gastro-esophageal reflux disease with esophagitis: Secondary | ICD-10-CM | POA: Diagnosis not present

## 2018-10-08 DIAGNOSIS — Z95828 Presence of other vascular implants and grafts: Secondary | ICD-10-CM | POA: Diagnosis not present

## 2018-10-08 DIAGNOSIS — I10 Essential (primary) hypertension: Secondary | ICD-10-CM | POA: Diagnosis not present

## 2018-10-08 DIAGNOSIS — Z0000 Encounter for general adult medical examination without abnormal findings: Secondary | ICD-10-CM | POA: Diagnosis not present

## 2018-10-08 DIAGNOSIS — F329 Major depressive disorder, single episode, unspecified: Secondary | ICD-10-CM | POA: Diagnosis not present

## 2018-10-08 DIAGNOSIS — E782 Mixed hyperlipidemia: Secondary | ICD-10-CM | POA: Diagnosis not present

## 2018-10-08 DIAGNOSIS — G894 Chronic pain syndrome: Secondary | ICD-10-CM | POA: Diagnosis not present

## 2018-10-08 DIAGNOSIS — Z1389 Encounter for screening for other disorder: Secondary | ICD-10-CM | POA: Diagnosis not present

## 2018-10-08 DIAGNOSIS — Z23 Encounter for immunization: Secondary | ICD-10-CM | POA: Diagnosis not present

## 2018-10-08 DIAGNOSIS — M8588 Other specified disorders of bone density and structure, other site: Secondary | ICD-10-CM | POA: Diagnosis not present

## 2018-10-08 DIAGNOSIS — K279 Peptic ulcer, site unspecified, unspecified as acute or chronic, without hemorrhage or perforation: Secondary | ICD-10-CM | POA: Diagnosis not present

## 2018-10-08 DIAGNOSIS — G43909 Migraine, unspecified, not intractable, without status migrainosus: Secondary | ICD-10-CM | POA: Diagnosis not present

## 2018-10-31 DIAGNOSIS — G894 Chronic pain syndrome: Secondary | ICD-10-CM | POA: Diagnosis not present

## 2018-10-31 DIAGNOSIS — M533 Sacrococcygeal disorders, not elsewhere classified: Secondary | ICD-10-CM | POA: Diagnosis not present

## 2018-10-31 DIAGNOSIS — M47817 Spondylosis without myelopathy or radiculopathy, lumbosacral region: Secondary | ICD-10-CM | POA: Diagnosis not present

## 2018-11-13 ENCOUNTER — Ambulatory Visit
Admission: RE | Admit: 2018-11-13 | Discharge: 2018-11-13 | Disposition: A | Discharge status: Home / Self Care | Payer: Medicare Other | Source: Ambulatory Visit | Attending: Internal Medicine | Admitting: Internal Medicine

## 2018-11-13 DIAGNOSIS — Z1231 Encounter for screening mammogram for malignant neoplasm of breast: Secondary | ICD-10-CM

## 2018-12-03 DIAGNOSIS — G4709 Other insomnia: Secondary | ICD-10-CM | POA: Diagnosis not present

## 2018-12-03 DIAGNOSIS — F341 Dysthymic disorder: Secondary | ICD-10-CM | POA: Diagnosis not present

## 2018-12-03 DIAGNOSIS — G4730 Sleep apnea, unspecified: Secondary | ICD-10-CM | POA: Diagnosis not present

## 2018-12-03 DIAGNOSIS — Z634 Disappearance and death of family member: Secondary | ICD-10-CM | POA: Diagnosis not present

## 2018-12-03 DIAGNOSIS — F4312 Post-traumatic stress disorder, chronic: Secondary | ICD-10-CM | POA: Diagnosis not present

## 2018-12-25 DIAGNOSIS — J189 Pneumonia, unspecified organism: Secondary | ICD-10-CM

## 2018-12-25 HISTORY — DX: Pneumonia, unspecified organism: J18.9

## 2018-12-27 ENCOUNTER — Other Ambulatory Visit: Payer: Self-pay | Admitting: Internal Medicine

## 2018-12-27 DIAGNOSIS — M8580 Other specified disorders of bone density and structure, unspecified site: Secondary | ICD-10-CM

## 2018-12-31 DIAGNOSIS — Z79891 Long term (current) use of opiate analgesic: Secondary | ICD-10-CM | POA: Diagnosis not present

## 2018-12-31 DIAGNOSIS — M47817 Spondylosis without myelopathy or radiculopathy, lumbosacral region: Secondary | ICD-10-CM | POA: Diagnosis not present

## 2018-12-31 DIAGNOSIS — Z79899 Other long term (current) drug therapy: Secondary | ICD-10-CM | POA: Diagnosis not present

## 2018-12-31 DIAGNOSIS — G894 Chronic pain syndrome: Secondary | ICD-10-CM | POA: Diagnosis not present

## 2018-12-31 DIAGNOSIS — M961 Postlaminectomy syndrome, not elsewhere classified: Secondary | ICD-10-CM | POA: Diagnosis not present

## 2019-01-09 DIAGNOSIS — H25813 Combined forms of age-related cataract, bilateral: Secondary | ICD-10-CM | POA: Diagnosis not present

## 2019-01-09 DIAGNOSIS — H04123 Dry eye syndrome of bilateral lacrimal glands: Secondary | ICD-10-CM | POA: Diagnosis not present

## 2019-01-09 DIAGNOSIS — H524 Presbyopia: Secondary | ICD-10-CM | POA: Diagnosis not present

## 2019-01-09 DIAGNOSIS — H43813 Vitreous degeneration, bilateral: Secondary | ICD-10-CM | POA: Diagnosis not present

## 2019-01-10 DIAGNOSIS — D100 Benign neoplasm of lip: Secondary | ICD-10-CM | POA: Diagnosis not present

## 2019-01-16 DIAGNOSIS — K2270 Barrett's esophagus without dysplasia: Secondary | ICD-10-CM | POA: Diagnosis not present

## 2019-01-16 DIAGNOSIS — Z8719 Personal history of other diseases of the digestive system: Secondary | ICD-10-CM | POA: Diagnosis not present

## 2019-01-16 DIAGNOSIS — Z1211 Encounter for screening for malignant neoplasm of colon: Secondary | ICD-10-CM | POA: Diagnosis not present

## 2019-01-17 DIAGNOSIS — I10 Essential (primary) hypertension: Secondary | ICD-10-CM | POA: Diagnosis not present

## 2019-02-20 ENCOUNTER — Ambulatory Visit
Admission: RE | Admit: 2019-02-20 | Discharge: 2019-02-20 | Disposition: A | Discharge status: Home / Self Care | Payer: Medicare Other | Source: Ambulatory Visit | Attending: Internal Medicine | Admitting: Internal Medicine

## 2019-02-20 DIAGNOSIS — M8589 Other specified disorders of bone density and structure, multiple sites: Secondary | ICD-10-CM | POA: Diagnosis not present

## 2019-02-20 DIAGNOSIS — Z780 Asymptomatic menopausal state: Secondary | ICD-10-CM | POA: Diagnosis not present

## 2019-02-20 DIAGNOSIS — M8580 Other specified disorders of bone density and structure, unspecified site: Secondary | ICD-10-CM

## 2019-03-04 DIAGNOSIS — Z79899 Other long term (current) drug therapy: Secondary | ICD-10-CM | POA: Diagnosis not present

## 2019-03-04 DIAGNOSIS — M47817 Spondylosis without myelopathy or radiculopathy, lumbosacral region: Secondary | ICD-10-CM | POA: Diagnosis not present

## 2019-03-04 DIAGNOSIS — M961 Postlaminectomy syndrome, not elsewhere classified: Secondary | ICD-10-CM | POA: Diagnosis not present

## 2019-03-04 DIAGNOSIS — M5137 Other intervertebral disc degeneration, lumbosacral region: Secondary | ICD-10-CM | POA: Diagnosis not present

## 2019-03-04 DIAGNOSIS — Z79891 Long term (current) use of opiate analgesic: Secondary | ICD-10-CM | POA: Diagnosis not present

## 2019-03-04 DIAGNOSIS — G894 Chronic pain syndrome: Secondary | ICD-10-CM | POA: Diagnosis not present

## 2019-04-10 DIAGNOSIS — J449 Chronic obstructive pulmonary disease, unspecified: Secondary | ICD-10-CM | POA: Diagnosis not present

## 2019-04-10 DIAGNOSIS — I10 Essential (primary) hypertension: Secondary | ICD-10-CM | POA: Diagnosis not present

## 2019-04-10 DIAGNOSIS — E039 Hypothyroidism, unspecified: Secondary | ICD-10-CM | POA: Diagnosis not present

## 2019-04-17 DIAGNOSIS — F4312 Post-traumatic stress disorder, chronic: Secondary | ICD-10-CM | POA: Diagnosis not present

## 2019-04-17 DIAGNOSIS — F341 Dysthymic disorder: Secondary | ICD-10-CM | POA: Diagnosis not present

## 2019-04-17 DIAGNOSIS — G4709 Other insomnia: Secondary | ICD-10-CM | POA: Diagnosis not present

## 2019-04-17 DIAGNOSIS — Z634 Disappearance and death of family member: Secondary | ICD-10-CM | POA: Diagnosis not present

## 2019-04-17 DIAGNOSIS — G4730 Sleep apnea, unspecified: Secondary | ICD-10-CM | POA: Diagnosis not present

## 2019-04-23 DIAGNOSIS — R509 Fever, unspecified: Secondary | ICD-10-CM | POA: Diagnosis not present

## 2019-04-23 DIAGNOSIS — K0510 Chronic gingivitis, plaque induced: Secondary | ICD-10-CM | POA: Diagnosis not present

## 2019-04-23 DIAGNOSIS — N952 Postmenopausal atrophic vaginitis: Secondary | ICD-10-CM | POA: Diagnosis not present

## 2019-04-23 DIAGNOSIS — R350 Frequency of micturition: Secondary | ICD-10-CM | POA: Diagnosis not present

## 2019-05-01 DIAGNOSIS — M533 Sacrococcygeal disorders, not elsewhere classified: Secondary | ICD-10-CM | POA: Diagnosis not present

## 2019-05-01 DIAGNOSIS — G894 Chronic pain syndrome: Secondary | ICD-10-CM | POA: Diagnosis not present

## 2019-05-01 DIAGNOSIS — M5137 Other intervertebral disc degeneration, lumbosacral region: Secondary | ICD-10-CM | POA: Diagnosis not present

## 2019-05-01 DIAGNOSIS — M961 Postlaminectomy syndrome, not elsewhere classified: Secondary | ICD-10-CM | POA: Diagnosis not present

## 2019-05-22 ENCOUNTER — Other Ambulatory Visit: Payer: Self-pay | Admitting: Cardiology

## 2019-05-23 DIAGNOSIS — D500 Iron deficiency anemia secondary to blood loss (chronic): Secondary | ICD-10-CM | POA: Diagnosis not present

## 2019-05-23 DIAGNOSIS — E039 Hypothyroidism, unspecified: Secondary | ICD-10-CM | POA: Diagnosis not present

## 2019-05-23 DIAGNOSIS — G459 Transient cerebral ischemic attack, unspecified: Secondary | ICD-10-CM | POA: Diagnosis not present

## 2019-05-23 DIAGNOSIS — I10 Essential (primary) hypertension: Secondary | ICD-10-CM | POA: Diagnosis not present

## 2019-05-23 DIAGNOSIS — F329 Major depressive disorder, single episode, unspecified: Secondary | ICD-10-CM | POA: Diagnosis not present

## 2019-05-23 DIAGNOSIS — J449 Chronic obstructive pulmonary disease, unspecified: Secondary | ICD-10-CM | POA: Diagnosis not present

## 2019-05-23 DIAGNOSIS — E782 Mixed hyperlipidemia: Secondary | ICD-10-CM | POA: Diagnosis not present

## 2019-06-11 ENCOUNTER — Telehealth: Payer: Self-pay

## 2019-06-11 DIAGNOSIS — E785 Hyperlipidemia, unspecified: Secondary | ICD-10-CM

## 2019-06-11 NOTE — Telephone Encounter (Signed)
Not currently managing this Repatha.

## 2019-06-17 ENCOUNTER — Encounter (HOSPITAL_COMMUNITY): Payer: Self-pay

## 2019-06-17 ENCOUNTER — Other Ambulatory Visit: Payer: Self-pay

## 2019-06-17 ENCOUNTER — Observation Stay (HOSPITAL_COMMUNITY)
Admission: EM | Admit: 2019-06-17 | Discharge: 2019-06-18 | Disposition: A | Discharge status: Home / Self Care | Payer: Medicare Other | Attending: Internal Medicine | Admitting: Internal Medicine

## 2019-06-17 ENCOUNTER — Emergency Department (HOSPITAL_COMMUNITY): Payer: Medicare Other

## 2019-06-17 DIAGNOSIS — J189 Pneumonia, unspecified organism: Secondary | ICD-10-CM | POA: Diagnosis present

## 2019-06-17 DIAGNOSIS — M545 Low back pain: Secondary | ICD-10-CM | POA: Insufficient documentation

## 2019-06-17 DIAGNOSIS — J9601 Acute respiratory failure with hypoxia: Secondary | ICD-10-CM | POA: Diagnosis not present

## 2019-06-17 DIAGNOSIS — G4730 Sleep apnea, unspecified: Secondary | ICD-10-CM | POA: Diagnosis not present

## 2019-06-17 DIAGNOSIS — Z7951 Long term (current) use of inhaled steroids: Secondary | ICD-10-CM | POA: Insufficient documentation

## 2019-06-17 DIAGNOSIS — K219 Gastro-esophageal reflux disease without esophagitis: Secondary | ICD-10-CM | POA: Diagnosis not present

## 2019-06-17 DIAGNOSIS — R0602 Shortness of breath: Secondary | ICD-10-CM | POA: Diagnosis not present

## 2019-06-17 DIAGNOSIS — I10 Essential (primary) hypertension: Secondary | ICD-10-CM | POA: Diagnosis not present

## 2019-06-17 DIAGNOSIS — Z8673 Personal history of transient ischemic attack (TIA), and cerebral infarction without residual deficits: Secondary | ICD-10-CM | POA: Diagnosis not present

## 2019-06-17 DIAGNOSIS — Z79899 Other long term (current) drug therapy: Secondary | ICD-10-CM | POA: Insufficient documentation

## 2019-06-17 DIAGNOSIS — E785 Hyperlipidemia, unspecified: Secondary | ICD-10-CM | POA: Diagnosis not present

## 2019-06-17 DIAGNOSIS — G8929 Other chronic pain: Secondary | ICD-10-CM | POA: Insufficient documentation

## 2019-06-17 DIAGNOSIS — Z981 Arthrodesis status: Secondary | ICD-10-CM | POA: Insufficient documentation

## 2019-06-17 DIAGNOSIS — Z89611 Acquired absence of right leg above knee: Secondary | ICD-10-CM | POA: Diagnosis not present

## 2019-06-17 DIAGNOSIS — J9811 Atelectasis: Secondary | ICD-10-CM | POA: Insufficient documentation

## 2019-06-17 DIAGNOSIS — J441 Chronic obstructive pulmonary disease with (acute) exacerbation: Secondary | ICD-10-CM | POA: Diagnosis not present

## 2019-06-17 DIAGNOSIS — I700 Atherosclerosis of aorta: Secondary | ICD-10-CM | POA: Insufficient documentation

## 2019-06-17 DIAGNOSIS — R0902 Hypoxemia: Secondary | ICD-10-CM | POA: Diagnosis not present

## 2019-06-17 DIAGNOSIS — J449 Chronic obstructive pulmonary disease, unspecified: Secondary | ICD-10-CM | POA: Diagnosis not present

## 2019-06-17 DIAGNOSIS — F17210 Nicotine dependence, cigarettes, uncomplicated: Secondary | ICD-10-CM | POA: Insufficient documentation

## 2019-06-17 DIAGNOSIS — I77810 Thoracic aortic ectasia: Secondary | ICD-10-CM | POA: Insufficient documentation

## 2019-06-17 DIAGNOSIS — G43909 Migraine, unspecified, not intractable, without status migrainosus: Secondary | ICD-10-CM | POA: Diagnosis not present

## 2019-06-17 DIAGNOSIS — Z1159 Encounter for screening for other viral diseases: Secondary | ICD-10-CM | POA: Diagnosis not present

## 2019-06-17 DIAGNOSIS — I2510 Atherosclerotic heart disease of native coronary artery without angina pectoris: Secondary | ICD-10-CM | POA: Diagnosis not present

## 2019-06-17 DIAGNOSIS — Z20828 Contact with and (suspected) exposure to other viral communicable diseases: Secondary | ICD-10-CM | POA: Diagnosis not present

## 2019-06-17 DIAGNOSIS — D72829 Elevated white blood cell count, unspecified: Secondary | ICD-10-CM

## 2019-06-17 DIAGNOSIS — F418 Other specified anxiety disorders: Secondary | ICD-10-CM | POA: Insufficient documentation

## 2019-06-17 LAB — BASIC METABOLIC PANEL WITH GFR
Anion gap: 12 (ref 5–15)
BUN: 5 mg/dL — ABNORMAL LOW (ref 8–23)
CO2: 22 mmol/L (ref 22–32)
Calcium: 9.9 mg/dL (ref 8.9–10.3)
Chloride: 104 mmol/L (ref 98–111)
Creatinine, Ser: 0.87 mg/dL (ref 0.44–1.00)
GFR calc Af Amer: >60 mL/min
GFR calc non Af Amer: >60 mL/min
Glucose, Bld: 116 mg/dL — ABNORMAL HIGH (ref 70–99)
Potassium: 3.8 mmol/L (ref 3.5–5.1)
Sodium: 138 mmol/L (ref 135–145)

## 2019-06-17 LAB — CBC WITH DIFFERENTIAL/PLATELET
Abs Immature Granulocytes: 0.06 10*3/uL (ref 0.00–0.07)
Basophils Absolute: 0.1 10*3/uL (ref 0.0–0.1)
Basophils Relative: 1 %
Eosinophils Absolute: 0.5 10*3/uL (ref 0.0–0.5)
Eosinophils Relative: 4 %
HCT: 44.3 % (ref 36.0–46.0)
Hemoglobin: 15.3 g/dL — ABNORMAL HIGH (ref 12.0–15.0)
Immature Granulocytes: 0 %
Lymphocytes Relative: 14 %
Lymphs Abs: 1.8 10*3/uL (ref 0.7–4.0)
MCH: 33.2 pg (ref 26.0–34.0)
MCHC: 34.5 g/dL (ref 30.0–36.0)
MCV: 96.1 fL (ref 80.0–100.0)
Monocytes Absolute: 0.9 10*3/uL (ref 0.1–1.0)
Monocytes Relative: 7 %
Neutro Abs: 10.3 10*3/uL — ABNORMAL HIGH (ref 1.7–7.7)
Neutrophils Relative %: 74 %
Platelets: 362 10*3/uL (ref 150–400)
RBC: 4.61 MIL/uL (ref 3.87–5.11)
RDW: 13.4 % (ref 11.5–15.5)
WBC: 13.6 10*3/uL — ABNORMAL HIGH (ref 4.0–10.5)
nRBC: 0 % (ref 0.0–0.2)

## 2019-06-17 LAB — D-DIMER, QUANTITATIVE: D-Dimer, Quant: 0.61 ug{FEU}/mL — ABNORMAL HIGH (ref 0.00–0.50)

## 2019-06-17 LAB — SARS CORONAVIRUS 2 BY RT PCR (HOSPITAL ORDER, PERFORMED IN ~~LOC~~ HOSPITAL LAB): SARS Coronavirus 2: NEGATIVE

## 2019-06-17 MED ORDER — BUPROPION HCL ER (XL) 150 MG PO TB24
150.0000 mg | ORAL_TABLET | Freq: Every day | ORAL | Status: DC
  Administered 2019-06-18: 150 mg via ORAL
  Filled 2019-06-17: qty 1

## 2019-06-17 MED ORDER — PANTOPRAZOLE SODIUM 40 MG PO TBEC
40.0000 mg | DELAYED_RELEASE_TABLET | Freq: Two times a day (BID) | ORAL | Status: DC
  Administered 2019-06-18 (×2): 40 mg via ORAL
  Filled 2019-06-17 (×2): qty 1

## 2019-06-17 MED ORDER — UMECLIDINIUM BROMIDE 62.5 MCG/INH IN AEPB
1.0000 | INHALATION_SPRAY | Freq: Every day | RESPIRATORY_TRACT | Status: DC
  Administered 2019-06-18: 1 via RESPIRATORY_TRACT
  Filled 2019-06-17: qty 7

## 2019-06-17 MED ORDER — ALBUTEROL SULFATE HFA 108 (90 BASE) MCG/ACT IN AERS
4.0000 | INHALATION_SPRAY | Freq: Once | RESPIRATORY_TRACT | Status: AC
  Administered 2019-06-17: 4 via RESPIRATORY_TRACT

## 2019-06-17 MED ORDER — ENOXAPARIN SODIUM 40 MG/0.4ML ~~LOC~~ SOLN
40.0000 mg | Freq: Every day | SUBCUTANEOUS | Status: DC
  Filled 2019-06-17: qty 0.4

## 2019-06-17 MED ORDER — ARIPIPRAZOLE 2 MG PO TABS
2.0000 mg | ORAL_TABLET | Freq: Every day | ORAL | Status: DC
  Administered 2019-06-18: 2 mg via ORAL
  Filled 2019-06-17: qty 1

## 2019-06-17 MED ORDER — METHYLPREDNISOLONE SODIUM SUCC 40 MG IJ SOLR
40.0000 mg | Freq: Four times a day (QID) | INTRAMUSCULAR | Status: DC
  Administered 2019-06-18 (×2): 40 mg via INTRAVENOUS
  Filled 2019-06-17 (×2): qty 1

## 2019-06-17 MED ORDER — IPRATROPIUM BROMIDE HFA 17 MCG/ACT IN AERS
2.0000 | INHALATION_SPRAY | Freq: Once | RESPIRATORY_TRACT | Status: AC
  Administered 2019-06-17: 2 via RESPIRATORY_TRACT
  Filled 2019-06-17: qty 12.9

## 2019-06-17 MED ORDER — ALBUTEROL SULFATE (2.5 MG/3ML) 0.083% IN NEBU
2.5000 mg | INHALATION_SOLUTION | Freq: Four times a day (QID) | RESPIRATORY_TRACT | Status: DC
  Administered 2019-06-17 – 2019-06-18 (×2): 2.5 mg via RESPIRATORY_TRACT
  Filled 2019-06-17 (×2): qty 3

## 2019-06-17 MED ORDER — IPRATROPIUM-ALBUTEROL 20-100 MCG/ACT IN AERS
1.0000 | INHALATION_SPRAY | Freq: Four times a day (QID) | RESPIRATORY_TRACT | Status: DC | PRN
  Filled 2019-06-17: qty 4

## 2019-06-17 MED ORDER — METHOCARBAMOL 500 MG PO TABS
750.0000 mg | ORAL_TABLET | Freq: Three times a day (TID) | ORAL | Status: DC | PRN
  Administered 2019-06-18: 750 mg via ORAL
  Filled 2019-06-17: qty 2

## 2019-06-17 MED ORDER — METHYLPREDNISOLONE SODIUM SUCC 125 MG IJ SOLR
125.0000 mg | Freq: Once | INTRAMUSCULAR | Status: AC
  Administered 2019-06-17: 13:00:00 125 mg via INTRAVENOUS
  Filled 2019-06-17: qty 2

## 2019-06-17 MED ORDER — IOHEXOL 350 MG/ML SOLN
75.0000 mL | Freq: Once | INTRAVENOUS | Status: AC | PRN
  Administered 2019-06-17: 75 mL via INTRAVENOUS

## 2019-06-17 MED ORDER — IPRATROPIUM BROMIDE HFA 17 MCG/ACT IN AERS
2.0000 | INHALATION_SPRAY | Freq: Once | RESPIRATORY_TRACT | Status: AC
  Administered 2019-06-17: 13:00:00 2 via RESPIRATORY_TRACT
  Filled 2019-06-17: qty 12.9

## 2019-06-17 MED ORDER — ALBUTEROL SULFATE HFA 108 (90 BASE) MCG/ACT IN AERS
6.0000 | INHALATION_SPRAY | Freq: Once | RESPIRATORY_TRACT | Status: AC
  Administered 2019-06-17: 6 via RESPIRATORY_TRACT
  Filled 2019-06-17: qty 6.7

## 2019-06-17 MED ORDER — AMLODIPINE BESYLATE 10 MG PO TABS
10.0000 mg | ORAL_TABLET | Freq: Every day | ORAL | Status: DC
  Administered 2019-06-18: 10 mg via ORAL
  Filled 2019-06-17: qty 1

## 2019-06-17 MED ORDER — LEVOFLOXACIN IN D5W 750 MG/150ML IV SOLN
750.0000 mg | Freq: Once | INTRAVENOUS | Status: AC
  Administered 2019-06-17: 750 mg via INTRAVENOUS
  Filled 2019-06-17: qty 150

## 2019-06-17 MED ORDER — ALPRAZOLAM 0.5 MG PO TABS: 1.0000 mg | ORAL_TABLET | Freq: Every evening | ORAL | Status: DC | PRN

## 2019-06-17 MED ORDER — ENOXAPARIN SODIUM 40 MG/0.4ML ~~LOC~~ SOLN
40.0000 mg | Freq: Every day | SUBCUTANEOUS | Status: DC
  Administered 2019-06-18: 40 mg via SUBCUTANEOUS
  Filled 2019-06-17: qty 0.4

## 2019-06-17 MED ORDER — SODIUM CHLORIDE 0.9 % IV BOLUS
500.0000 mL | Freq: Once | INTRAVENOUS | Status: AC
  Administered 2019-06-17: 500 mL via INTRAVENOUS

## 2019-06-17 MED ORDER — LEVOFLOXACIN IN D5W 500 MG/100ML IV SOLN: 500.0000 mg | INTRAVENOUS | Status: DC

## 2019-06-17 NOTE — ED Notes (Signed)
While patient was talking to me, her oxygen level will drop to 88%, but with rest it increases, she stated when she was walking she lost her breath and began to get dizzy

## 2019-06-17 NOTE — ED Provider Notes (Signed)
  Face-to-face evaluation   History: She complains of shortness of breath for several days, not improved with usual treatment at home including albuterol inhaler.  She admits to smoking cigarettes recently, and previously had stopped for a time.  She denies fever, chills, COVID exposure, nausea, vomiting, dizziness or weakness.  Physical exam: Alert elderly female.  No respiratory distress.  Lungs have somewhat diminished air movement no associated wheezes rales or rhonchi.  Heart with mild tachycardia, but no murmur.   Medical screening examination/treatment/procedure(s) were conducted as a shared visit with non-physician practitioner(s) and myself.  I personally evaluated the patient during the encounter    Daleen Bo, MD 06/19/19 1011

## 2019-06-17 NOTE — ED Triage Notes (Signed)
Shortness of breath x 3 days, worsened yesterday when she was in [place], she has coughed so hard she has tasted blood  No fevers

## 2019-06-17 NOTE — ED Provider Notes (Signed)
[facility] EMERGENCY DEPARTMENT Provider Note   CSN: [id number] Arrival date & time: 06/17/19  1139    History   Chief Complaint Chief Complaint  Patient presents with  . Shortness of Breath    HPI Brenda Carney is a 68 y.o. female.     HPI   Patient is a 68 year old female with a history of cerebral aneurysm, cerebrovascular disease, COPD, CAD, asthma, GERD, hypertension, hyperlipidemia, PAD, sleep apnea, who presents to the emergency department today for evaluation of shortness of breath that started a few days ago but worsened yesterday. States she was getting winded even with her ADLs. She reports associated productive cough and wheezing. Denies fevers, chest pain, or pain with inspiration. She has tried albuterol 3 tines since 530AM this morning with mild improvement. States that over the last few days she has been somewhat noncompliant due to being on vacation at her friends house in [place]. Further states she quit smoking several years ago but had a few cigarettes this weekend.  Denies leg pain/swelling, hemoptysis, recent surgery/trauma, personal hx of cancer, or hx of DVT/PE. States she has been using vaginal estrogen for the last few weeks. Reports recent travel. States that the drive was 5 hours and she stopped at least once on the way there and back. She states she was SOB before starting the drive.   Denies known COVID exposures.  Past Medical History:  Diagnosis Date  . Anemia    as a younger woman  . Arthritis    "thumbs; hips" (05/14/2017)  . 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 Cerebrovascular disease    Carotid dopplers which showed no significant increase in velocities, extremely minimal on the left, antegrade vertebral bilaterally.  . Cervical spondylosis   . Chronic lower back pain   . COPD (chronic obstructive pulmonary disease) ([place])    "little bit" (05/14/2017)  .  Coronary artery disease, non-occlusive    Coronary calcium scoring: June 2018: Score 1106. 98 percentile for age -[place]  . Depression   . Exercise-induced asthma with acute exacerbation    "only in very hot weather" (05/14/2017)  . GERD (gastroesophageal reflux disease)   . BPZWCHEN(277.8)    "monthly" (05/14/2017)  . History of IBS    resovlved  . History of kidney stones   . 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 Hyperlipidemia with target LDL less than 70    Mostly statin intolerant,. Currently on Livalo 4 mg  . Hypertension, benign   . Lumbosacral spondylosis   . Migraine headache    "were monthly; none recently" (05/14/2017)  . PAD (peripheral artery disease) (HCC)    Status post right above-the-knee-below the knee popliteal artery bypass; postop right ABI 0.96.  Marland Kitchen Pneumonia "several times"  . Sleep apnea    does not wear CPAP; "think it was medication related" (05/14/2017)  . Thoracic aortic ectasia (HCC)    ~40 mm on Coronary Calcium Score CT - recommend f/u CTA or MRA  . TIA (transient ischemic attack) 09/2011   "several"    Patient Active Problem List   Diagnosis Date Noted  . Sepsis ([place]) 04/29/2018  . Hematemesis 04/29/2018  . Hypokalemia 04/29/2018  . AKI (acute kidney injury) ([place]) 04/29/2018  . Acute respiratory failure with hypoxia ([place]) 04/29/2018  . Thoracic aortic ectasia ([place])   . Sleep apnea   . Pneumonia   .  Migraine headache   . Lumbosacral spondylosis   . Hx of multiple concussions   . History of kidney stones   . History of IBS   . GERD (gastroesophageal reflux disease)   . Exercise-induced asthma with acute exacerbation   . Depression   . COPD exacerbation ([place])   . Chronic lower back pain   . Cervical spondylosis   . Cerebral aneurysm   . Arthritis   . Anemia   . Coronary artery disease, non-occlusive 06/28/2017  . Critical lower limb ischemia 05/14/2017  . Preoperative cardiovascular examination   .  PAD (peripheral artery disease) ([place])   . S/P lumbar spinal fusion 09/30/2014  . Essential hypertension   . Hyperlipidemia with target LDL less than 70   . Cerebrovascular disease   . Headache(784.0) 10/07/2013  . TIA (transient ischemic attack) 09/05/2013  . Tobacco abuse 09/05/2013    Past Surgical History:  Procedure Laterality Date  . ABDOMINAL AORTOGRAM W/LOWER EXTREMITY N/A 05/14/2017   Procedure: Abdominal Aortogram w/Lower Extremity;  Surgeon: Conrad Tuckerman, MD;  Location: [facility];  Service: Cardiovascular;  Laterality: N/A;  . ACROMIO-CLAVICULAR JOINT REPAIR Left 1990s   "I think it was called an AC joint removal"  . BACK SURGERY    . BYPASS GRAFT POPLITEAL TO POPLITEAL Right 05/15/2017   Procedure: BYPASS GRAFT ABOVE KNEE POPLITEAL TO BELOW KNEE POPLITEAL ARTERY;  Surgeon: Conrad Wellman, MD;  [facility];  Service: Vascular;  Laterality: Right;  . ESOPHAGOGASTRODUODENOSCOPY (EGD) WITH PROPOFOL N/A 04/29/2018   Procedure: ESOPHAGOGASTRODUODENOSCOPY (EGD) WITH PROPOFOL;  Surgeon: Otis Brace, MD;  Location: [facility];  Service: Gastroenterology;  Laterality: N/A;  . FOOT FRACTURE SURGERY Bilateral    multiple procedures  . FRACTURE SURGERY    . MAXIMUM ACCESS (MAS)POSTERIOR LUMBAR INTERBODY FUSION (PLIF) 1 LEVEL N/A 09/30/2014   Procedure: FOR MAXIMUM ACCESS (MAS) POSTERIOR LUMBAR INTERBODY FUSION (PLIF) 1 LEVEL;  Surgeon: Eustace Moore, MD;  Location: [facility];  Service: Neurosurgery;  Laterality: N/A;  FOR MAXIMUM ACCESS (MAS) POSTERIOR LUMBAR INTERBODY FUSION (PLIF) 1 LEVEL LUMBAR 4-5  . MULTIPLE TOOTH EXTRACTIONS Right 04/2017  . NM MYOVIEW LTD  06/2017    Normal EF, 66%. No ST changes. Normal study. LOW RISK.  Marland Kitchen TRANSTHORACIC ECHOCARDIOGRAM  09/07/2013   Done to evaluate TIA: Normal LV size and function. EF 55-60%. GR 1 DD. Mild left atrial dilation. Mildly calcified mitral leaflets. Otherwise normal.     OB History   No obstetric history on file.       Home Medications    Prior to Admission medications   Medication Sig Start Date End Date Taking? Authorizing Provider  ALPRAZolam Duanne Moron) 1 MG tablet Take 1 mg by mouth at bedtime as needed for anxiety.    [provider]  amLODipine (NORVASC) 2.5 MG tablet Take 1 tablet (2.5 mg total) by mouth daily. 04/30/18   Dana Allan I, MD  ARIPiprazole (ABILIFY) 2 MG tablet Take 2 mg by mouth daily.    [provider]  budesonide-formoterol (SYMBICORT) 160-4.5 MCG/ACT inhaler Inhale 2 puffs into the lungs 2 (two) times daily. 04/30/18   Dana Allan I, MD  buPROPion (WELLBUTRIN XL) 150 MG 24 hr tablet Take 150 mg by mouth daily. 04/04/18   [provider]  desvenlafaxine (PRISTIQ) 100 MG 24 hr tablet Take 100 mg by mouth daily.    [provider]  fenofibrate (TRICOR) 145 MG tablet Take 145 mg by mouth daily.  08/23/15  [provider]  ferrous sulfate 325 (65 FE) MG tablet Take 1 tablet (325 mg total) by mouth daily. 04/30/18 06/17/19  Bonnell Public, MD  Ipratropium-Albuterol (COMBIVENT RESPIMAT) 20-100 MCG/ACT AERS respimat Inhale 1 puff into the lungs every 6 (six) hours as needed for wheezing. 04/30/18   Bonnell Public, MD  methocarbamol (ROBAXIN) 750 MG tablet Take 1 tablet (750 mg total) by mouth 3 (three) times daily as needed for muscle spasms. 05/07/18   Lajean Saver, MD  mometasone (NASONEX) 50 MCG/ACT nasal spray Place 2 sprays into the nose daily as needed (allergies).    [provider]  Multiple Vitamin (MULTIVITAMIN WITH MINERALS) TABS tablet Take 1 tablet by mouth daily.    [provider]  mupirocin ointment (BACTROBAN) 2 % Place 1 application into the nose 2 (two) times daily as needed (For wound care.).    [provider]  pantoprazole (PROTONIX) 40 MG tablet Take 1 tablet (40 mg total) by mouth 2 (two) times daily. 04/30/18   Bonnell Public, MD  Pitavastatin Calcium 4 MG TABS Take 4 mg by  mouth daily.  06/07/17   Leonie Man, MD  potassium chloride (K-DUR,KLOR-CON) 10 MEQ tablet Take 10 mEq by mouth daily.    [provider]  predniSONE (DELTASONE) 20 MG tablet 3 po once a day for 2 days, then 2 po once a day for 3 days, then 1 po once a day for 3 days 05/08/18   Lajean Saver, MD  Propylene Glycol-Glycerin (SOOTHE OP) Apply 1 drop to eye daily as needed (dry eyes).    [provider]  REPATHA SURECLICK 637 MG/ML SOAJ INJECT 140MG  INTO THE SKIN EVERY 14 DAYS. Patient taking differently: Take 140 mg by mouth every 14 (fourteen) days.  05/22/19   Leonie Man, MD  tiotropium (SPIRIVA HANDIHALER) 18 MCG inhalation capsule Place 1 capsule (18 mcg total) into inhaler and inhale daily. 04/30/18 04/30/19  Dana Allan I, MD  zolpidem (AMBIEN) 5 MG tablet Take 1 tablet (5 mg total) by mouth at bedtime. 04/30/18   Bonnell Public, MD    Family History Family History  Problem Relation Age of Onset  . Stroke Father   . Heart attack Mother   . Stroke Mother     Social History Social History   Tobacco Use  . Smoking status: Light Tobacco Smoker    Packs/day: 0.02    Years: 36.00    Pack years: 0.72    Types: Cigarettes    Last attempt to quit: 04/24/2017    Years since quitting: 2.1  . Smokeless tobacco: Never Used  Substance Use Topics  . Alcohol use: Yes    Alcohol/week: 3.0 standard drinks    Types: 3 Glasses of wine per week  . Drug use: No     Allergies   Penicillins and Lyrica [pregabalin]   Review of Systems Review of Systems  Constitutional: Negative for fever.  HENT: Negative for ear pain and sore throat.   Eyes: Negative for visual disturbance.  Respiratory: Positive for cough, shortness of breath and wheezing.   Cardiovascular: Negative for chest pain and leg swelling.  Gastrointestinal: Negative for abdominal pain, constipation, diarrhea, nausea and vomiting.  Genitourinary: Negative for dysuria and hematuria.   Musculoskeletal: Negative for back pain.  Skin: Negative for rash.  Neurological: Negative for headaches.  All other systems reviewed and are negative.    Physical Exam Updated Vital Signs BP (!) 166/94 (BP Location: Right Arm)  Pulse (!) 112   Temp 98.7 F (37.1 C) (Oral)   Resp (!) 24   Ht 5\' 5"  (1.651 m)   Wt 68 kg   SpO2 92%   BMI 24.96 kg/m   Physical Exam Vitals signs and nursing note reviewed.  Constitutional:      General: She is not in acute distress.    Appearance: She is well-developed.  HENT:     Head: Normocephalic and atraumatic.  Eyes:     Conjunctiva/sclera: Conjunctivae normal.  Neck:     Musculoskeletal: Neck supple.  Cardiovascular:     Rate and Rhythm: Regular rhythm. Tachycardia present.     Heart sounds: Normal heart sounds. No murmur.  Pulmonary:     Effort: Pulmonary effort is normal. No respiratory distress.     Breath sounds: Examination of the right-upper field reveals wheezing. Examination of the left-upper field reveals wheezing. Examination of the right-middle field reveals rhonchi. Examination of the right-lower field reveals rhonchi. Wheezing and rhonchi present. No rales.  Abdominal:     General: Bowel sounds are normal.     Palpations: Abdomen is soft.     Tenderness: There is no abdominal tenderness. There is no guarding or rebound.  Musculoskeletal:     Right lower leg: She exhibits no tenderness. No edema.     Left lower leg: She exhibits no tenderness. No edema.  Skin:    General: Skin is warm and dry.  Neurological:     Mental Status: She is alert.     ED Treatments / Results  Labs (all labs ordered are listed, but only abnormal results are displayed) Labs Reviewed  CBC WITH DIFFERENTIAL/PLATELET - Abnormal; Notable for the following components:      Result Value   WBC 13.6 (*)    Hemoglobin 15.3 (*)    Neutro Abs 10.3 (*)    All other components within normal limits  BASIC METABOLIC PANEL - Abnormal; Notable for  the following components:   Glucose, Bld 116 (*)    BUN 5 (*)    All other components within normal limits  D-DIMER, QUANTITATIVE (NOT AT [facility]) - Abnormal; Notable for the following components:   D-Dimer, Quant 0.61 (*)    All other components within normal limits  SARS CORONAVIRUS 2 (HOSPITAL ORDER, [facility])    EKG EKG Interpretation  Date/Time:  Tuesday June 17 2019 11:49:07 EDT Ventricular Rate:  109 PR Interval:    QRS Duration: 82 QT Interval:  328 QTC Calculation: 442 R Axis:   70 Text Interpretation:  Sinus tachycardia Consider right atrial enlargement Anterior infarct, old since last tracing no significant change Confirmed by Daleen Bo [phone]) on 06/17/2019 12:58:45 PM   Radiology Ct Angio Chest Pe W And/or Wo Contrast  Result Date: 06/17/2019 CLINICAL DATA:  Shortness of breath EXAM: CT ANGIOGRAPHY CHEST WITH CONTRAST TECHNIQUE: Multidetector CT imaging of the chest was performed using the standard protocol during bolus administration of intravenous contrast. Multiplanar CT image reconstructions and MIPs were obtained to evaluate the vascular anatomy. CONTRAST:  37mL OMNIPAQUE IOHEXOL 350 MG/ML SOLN COMPARISON:  Chest radiograph June 17, 2019 FINDINGS: Cardiovascular: There is no demonstrable pulmonary embolus. The ascending thoracic aorta measures 4.0 x 4.0 cm in transverse diameter. Aorta is mildly tortuous. There are scattered foci of calcification in the visualized great vessels. There are foci of aortic atherosclerosis. No dissection is evident within the aorta. There are foci of coronary artery calcification. There is no appreciable pericardial  effusion or pericardial thickening. Mediastinum/Nodes: Thyroid appears unremarkable. There are scattered subcentimeter mediastinal lymph nodes. There is no adenopathy by size criteria evident. There is no appreciable esophageal lesion. Lungs/Pleura: There is underlying centrilobular emphysematous  change. There is atelectatic change in the posterior right base with mild consolidation superimposed in this area. There is mild lower lobe bronchiectatic change. There is peribronchial thickening centrally, slightly more on the right than on the left. There is no appreciable pleural effusion. Upper Abdomen: Visualized upper abdominal structures appear unremarkable except for atherosclerotic calcification in the upper abdominal aorta. Musculoskeletal: There are foci of anterior wedging of the T5 and T8 vertebral bodies, age uncertain. No blastic or lytic bone lesions are evident. There are no appreciable chest wall lesions. Review of the MIP images confirms the above findings. IMPRESSION: 1.  No demonstrable pulmonary embolus. 2. Prominence of the ascending thoracic aorta, measuring 4.0 x 4.0 cm in transverse diameter. No dissection evident. Foci of aortic atherosclerosis noted as well as foci of great vessel and coronary artery calcification. Recommend annual imaging followup by CTA or MRA. This recommendation follows 2010 ACCF/AHA/AATS/ACR/ASA/SCA/SCAI/SIR/STS/SVM Guidelines for the Diagnosis and Management of Patients with Thoracic Aortic Disease. Circulation. 2010; 121: O841-Y606. Aortic aneurysm NOS (ICD10-I71.9). 3. Centrilobular emphysematous change. Atelectasis with mild associated pneumonia posterior right base. Areas of central bronchitis, slightly more on the right than on the left, as well as mild lower lobe bronchiectatic change bilaterally. 4.  No demonstrable adenopathy. Aortic Atherosclerosis (ICD10-I70.0) and Emphysema (ICD10-J43.9). Electronically Signed   By: Lowella Grip III M.D.   On: 06/17/2019 15:22   Dg Chest Portable 1 View  Result Date: 06/17/2019 CLINICAL DATA:  Shortness of breath and cough for the past 2 days. EXAM: PORTABLE CHEST 1 VIEW COMPARISON:  Chest x-ray dated July 27, 2018. FINDINGS: The heart size and mediastinal contours are within normal limits. Atherosclerotic  calcification of the aortic arch. Normal pulmonary vascularity. The lungs remain hyperinflated with emphysematous changes and mildly coarsened interstitial markings. No focal consolidation, pleural effusion, or pneumothorax. No acute osseous abnormality. IMPRESSION: 1. No active disease. 2. COPD. Electronically Signed   By: Titus Dubin M.D.   On: 06/17/2019 12:50    Procedures Procedures (including critical care time)  Medications Ordered in ED Medications  levofloxacin (LEVAQUIN) IVPB 750 mg (has no administration in time range)  albuterol (VENTOLIN HFA) 108 (90 Base) MCG/ACT inhaler 6 puff (6 puffs Inhalation Given 06/17/19 1229)  ipratropium (ATROVENT HFA) inhaler 2 puff (2 puffs Inhalation Given 06/17/19 1328)  sodium chloride 0.9 % bolus 500 mL (0 mLs Intravenous Stopped 06/17/19 1550)  methylPREDNISolone sodium succinate (SOLU-MEDROL) 125 mg/2 mL injection 125 mg (125 mg Intravenous Given 06/17/19 1329)  albuterol (VENTOLIN HFA) 108 (90 Base) MCG/ACT inhaler 4 puff (4 puffs Inhalation Given 06/17/19 1404)  ipratropium (ATROVENT HFA) inhaler 2 puff (2 puffs Inhalation Given 06/17/19 1404)  iohexol (OMNIPAQUE) 350 MG/ML injection 75 mL (75 mLs Intravenous Contrast Given 06/17/19 1505)     Initial Impression / Assessment and Plan / ED Course  I have reviewed the triage vital signs and the nursing notes.  Pertinent labs & imaging results that were available during my care of the patient were reviewed by me and considered in my medical decision making (see chart for details).     Final Clinical Impressions(s) / ED Diagnoses   Final diagnoses:  Community acquired pneumonia, unspecified laterality  Hypoxia  COPD exacerbation ([place])   Patient is a 68 year old female with a history of cerebral aneurysm,  cerebrovascular disease, COPD, CAD, asthma, GERD, hypertension, hyperlipidemia, PAD, sleep apnea, who presents to the emergency department today for evaluation of shortness of breath that  started a few days ago but worsened yesterday. States she was getting winded even with her ADLs. She reports associated productive cough and wheezing. Denies fevers, chest pain, or pain with inspiration. She has tried albuterol 3 tines since 530AM this morning with mild improvement.  CBC with mild leukocytosis at 13.6. hbg elevated at 15.3. BMP without electrolyte derangement. Normal kidney function.  Ddimer slightly positive COVID is negative.   EKG with sinus tach. Consider right atrial enlargement, Anterior infarct, old since last tracing no significant chang.   CXR with no active disease. Changes from COPD noted.   2:00 PM. Reassesed pt. She reports some improvement of sxs but still states she has some tightness in the chest. Will administer additional albuterol and atrovent.   CT chest without evidence of PE.  Prominence of the ascending thoracic aorta, measuring 4.0 x 4.0 cm in transverse diameter. No dissection evident. Foci of aortic atherosclerosis noted as well as foci of great vessel and coronary artery calcification. Recommend annual imaging followup by CTA or MRA. This recommendation follows 2010 ACCF/AHA/AATS/ACR/ASA/SCA/SCAI/SIR/STS/SVM Guidelines for the Diagnosis and Management of Patients with Thoracic Aortic Disease. Circulation. 2010; 121: L579-J282. Aortic aneurysm NOS (ICD10-I71.9). Centrilobular emphysematous change. Atelectasis with mild associated pneumonia posterior right base. Areas of central bronchitis, slightly more on the right than on the left, as well as mild lower lobe bronchiectatic change bilaterally. No demonstrable adenopathy. Aortic Atherosclerosis and Emphysema   Ambulated pt with pulse ox and she dropped to 89-91% on RA with good waveform. She became dyspneic and on return to the room she remained at 90% on RA. Placed on 2L. Discussed possibility of admission for further tx of pneumonia with likely superimposed COPD exacerbation.   5:00 PM Discussed case with  Dr. Karleen Hampshire who accepts pt for admission.   ED Discharge Orders    None       Bishop Dublin 06/17/19 1702    Daleen Bo, MD 06/19/19 1011

## 2019-06-17 NOTE — ED Notes (Signed)
Patient transported to CT 

## 2019-06-17 NOTE — ED Notes (Signed)
ED TO INPATIENT HANDOFF REPORT  ED Nurse Name and Phone #: Herbert Spires [phone]  S Name/Age/Gender Brenda Carney 68 y.o. female Room/Bed: 042C/042C  Code Status   Code Status: Full Code  Home/SNF/Other Home Patient oriented to: self Is this baseline? Yes   Triage Complete: Triage complete  Chief Complaint COPD, sob  Triage Note Shortness of breath x 3 days, worsened yesterday when she was in [place], she has coughed so hard she has tasted blood  No fevers   Allergies Allergies  Allergen Reactions  . Penicillins Swelling and Other (See Comments)    TONGUE SWELLING  PATIENT HAD A PCN REACTION WITH IMMEDIATE RASH, FACIAL/TONGUE/THROAT SWELLING, SOB, OR LIGHTHEADEDNESS WITH HYPOTENSION:  #  #  #  YES  #  #  #   Has patient had a PCN reaction causing severe rash involving mucus membranes or skin necrosis: No Has patient had a PCN reaction that required hospitalization: No Has patient had a PCN reaction occurring within the last 10 years: No   . Statins     muscl pain  . Lyrica [Pregabalin] Other (See Comments)    DRY MOUTH    Level of Care/Admitting Diagnosis ED Disposition    ED Disposition Condition Comment   Admit  Hospital Area: [facility] [[id number]]  Level of Care: Med-Surg [16]  I expect the patient will be discharged within 24 hours: Yes  LOW acuity---Tx typically complete <24 hrs---ACUTE conditions typically can be evaluated <24 hours---LABS likely to return to acceptable levels <24 hours---IS near functional baseline---EXPECTED to return to current living arrangement---NOT newly hypoxic: Does not meet criteria for 5C-Observation unit  Covid Evaluation: N/A  Diagnosis: Pneumonia [[id number]]  Admitting Physician: Georgette Shell [[id number]]  Attending Physician: Georgette Shell [phone]  PT Class (Do Not Modify): Observation [104]  PT Acc Code (Do Not Modify): Observation [10022]       B Medical/Surgery History Past Medical History:   Diagnosis Date  . Anemia    as a younger woman  . Arthritis    "thumbs; hips" (05/14/2017)  . 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 Cerebrovascular disease    Carotid dopplers which showed no significant increase in velocities, extremely minimal on the left, antegrade vertebral bilaterally.  . Cervical spondylosis   . Chronic lower back pain   . COPD (chronic obstructive pulmonary disease) ([place])    "little bit" (05/14/2017)  . Coronary artery disease, non-occlusive    Coronary calcium scoring: June 2018: Score 1106. 98 percentile for age -[place]  . Depression   . Exercise-induced asthma with acute exacerbation    "only in very hot weather" (05/14/2017)  . GERD (gastroesophageal reflux disease)   . NATFTDDU(202.5)    "monthly" (05/14/2017)  . History of IBS    resovlved  . History of kidney stones   . 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 Hyperlipidemia with target LDL less than 70    Mostly statin intolerant,. Currently on Livalo 4 mg  . Hypertension, benign   . Lumbosacral spondylosis   . Migraine headache    "were monthly; none recently" (05/14/2017)  . PAD (peripheral artery disease) (HCC)    Status post right above-the-knee-below the knee popliteal artery bypass; postop right ABI 0.96.  Marland Kitchen Pneumonia "several times"  . Sleep apnea    does not wear CPAP; "think it was medication related" (05/14/2017)  .  Thoracic aortic ectasia (HCC)    ~40 mm on Coronary Calcium Score CT - recommend f/u CTA or MRA  . TIA (transient ischemic attack) 09/2011   "several"   Past Surgical History:  Procedure Laterality Date  . ABDOMINAL AORTOGRAM W/LOWER EXTREMITY N/A 05/14/2017   Procedure: Abdominal Aortogram w/Lower Extremity;  Surgeon: Conrad Mount Horeb, MD;  Location: [facility];  Service: Cardiovascular;  Laterality: N/A;  . ACROMIO-CLAVICULAR JOINT REPAIR Left 1990s   "I think it  was called an AC joint removal"  . BACK SURGERY    . BYPASS GRAFT POPLITEAL TO POPLITEAL Right 05/15/2017   Procedure: BYPASS GRAFT ABOVE KNEE POPLITEAL TO BELOW KNEE POPLITEAL ARTERY;  Surgeon: Conrad Riverwood, MD;  [facility];  Service: Vascular;  Laterality: Right;  . ESOPHAGOGASTRODUODENOSCOPY (EGD) WITH PROPOFOL N/A 04/29/2018   Procedure: ESOPHAGOGASTRODUODENOSCOPY (EGD) WITH PROPOFOL;  Surgeon: Otis Brace, MD;  Location: [facility];  Service: Gastroenterology;  Laterality: N/A;  . FOOT FRACTURE SURGERY Bilateral    multiple procedures  . FRACTURE SURGERY    . MAXIMUM ACCESS (MAS)POSTERIOR LUMBAR INTERBODY FUSION (PLIF) 1 LEVEL N/A 09/30/2014   Procedure: FOR MAXIMUM ACCESS (MAS) POSTERIOR LUMBAR INTERBODY FUSION (PLIF) 1 LEVEL;  Surgeon: Eustace Moore, MD;  Location: [facility];  Service: Neurosurgery;  Laterality: N/A;  FOR MAXIMUM ACCESS (MAS) POSTERIOR LUMBAR INTERBODY FUSION (PLIF) 1 LEVEL LUMBAR 4-5  . MULTIPLE TOOTH EXTRACTIONS Right 04/2017  . NM MYOVIEW LTD  06/2017    Normal EF, 66%. No ST changes. Normal study. LOW RISK.  Marland Kitchen TRANSTHORACIC ECHOCARDIOGRAM  09/07/2013   Done to evaluate TIA: Normal LV size and function. EF 55-60%. GR 1 DD. Mild left atrial dilation. Mildly calcified mitral leaflets. Otherwise normal.     A IV Location/Drains/Wounds Patient Lines/Drains/Airways Status   Active Line/Drains/Airways    Name:   Placement date:   Placement time:   Site:   Days:   Peripheral IV 06/17/19 Right Wrist   06/17/19    1234    Wrist   less than 1   Peripheral IV 06/17/19 Left Antecubital   06/17/19    1405    Antecubital   less than 1          Intake/Output Last 24 hours No intake or output data in the 24 hours ending 06/17/19 2317  Labs/Imaging Results for orders placed or performed during the hospital encounter of 06/17/19 (from the past 48 hour(s))  CBC with Differential     Status: Abnormal   Collection Time: 06/17/19 12:07 PM  Result Value Ref Range    WBC 13.6 (H) 4.0 - 10.5 K/uL   RBC 4.61 3.87 - 5.11 MIL/uL   Hemoglobin 15.3 (H) 12.0 - 15.0 g/dL   HCT 44.3 36.0 - 46.0 %   MCV 96.1 80.0 - 100.0 fL   MCH 33.2 26.0 - 34.0 pg   MCHC 34.5 30.0 - 36.0 g/dL   RDW 13.4 11.5 - 15.5 %   Platelets 362 150 - 400 K/uL   nRBC 0.0 0.0 - 0.2 %   Neutrophils Relative % 74 %   Neutro Abs 10.3 (H) 1.7 - 7.7 K/uL   Lymphocytes Relative 14 %   Lymphs Abs 1.8 0.7 - 4.0 K/uL   Monocytes Relative 7 %   Monocytes Absolute 0.9 0.1 - 1.0 K/uL   Eosinophils Relative 4 %   Eosinophils Absolute 0.5 0.0 - 0.5 K/uL   Basophils Relative 1 %   Basophils Absolute 0.1 0.0 -  0.1 K/uL   Immature Granulocytes 0 %   Abs Immature Granulocytes 0.06 0.00 - 0.07 K/uL    Comment: Performed at [facility], [place] [address] [place], [place] [address]  Basic metabolic panel     Status: Abnormal   Collection Time: 06/17/19 12:07 PM  Result Value Ref Range   Sodium 138 135 - 145 mmol/L   Potassium 3.8 3.5 - 5.1 mmol/L   Chloride 104 98 - 111 mmol/L   CO2 22 22 - 32 mmol/L   Glucose, Bld 116 (H) 70 - 99 mg/dL   BUN 5 (L) 8 - 23 mg/dL   Creatinine, Ser 0.87 0.44 - 1.00 mg/dL   Calcium 9.9 8.9 - 10.3 mg/dL   GFR calc non Af Amer >60 >60 mL/min   GFR calc Af Amer >60 >60 mL/min   Anion gap 12 5 - 15    Comment: Performed at [facility], [place] [address], [place], [place] [address]  D-dimer, quantitative (not at [facility])     Status: Abnormal   Collection Time: 06/17/19 12:07 PM  Result Value Ref Range   D-Dimer, Quant 0.61 (H) 0.00 - 0.50 ug/mL-FEU    Comment: (NOTE) At the manufacturer cut-off of 0.50 ug/mL FEU, this assay has been documented to exclude PE with a sensitivity and negative predictive value of 97 to 99%.  At this time, this assay has not been approved by the FDA to exclude DVT/VTE. Results should be correlated with clinical presentation. Performed at [facility], [place] [address] [place],  [address]   SARS Coronavirus 2  (CEPHEID- Performed in [facility] hospital lab), Hosp Order     Status: None   Collection Time: 06/17/19 12:17 PM   Specimen: Nasopharyngeal Swab  Result Value Ref Range   SARS Coronavirus 2 NEGATIVE NEGATIVE    Comment: (NOTE) If result is NEGATIVE SARS-CoV-2 target nucleic acids are NOT DETECTED. The SARS-CoV-2 RNA is generally detectable in upper and lower  respiratory specimens during the acute phase of infection. The lowest  concentration of SARS-CoV-2 viral copies this assay can detect is 250  copies / mL. A negative result does not preclude SARS-CoV-2 infection  and should not be used as the sole basis for treatment or other  patient management decisions.  A negative result may occur with  improper specimen collection / handling, submission of specimen other  than nasopharyngeal swab, presence of viral mutation(s) within the  areas targeted by this assay, and inadequate number of viral copies  (<250 copies / mL). A negative result must be combined with clinical  observations, patient history, and epidemiological information. If result is POSITIVE SARS-CoV-2 target nucleic acids are DETECTED. The SARS-CoV-2 RNA is generally detectable in upper and lower  respiratory specimens dur ing the acute phase of infection.  Positive  results are indicative of active infection with SARS-CoV-2.  Clinical  correlation with patient history and other diagnostic information is  necessary to determine patient infection status.  Positive results do  not rule out bacterial infection or co-infection with other viruses. If result is PRESUMPTIVE POSTIVE SARS-CoV-2 nucleic acids MAY BE PRESENT.   A presumptive positive result was obtained on the submitted specimen  and confirmed on repeat testing.  While 2019 novel coronavirus  (SARS-CoV-2) nucleic acids may be present in the submitted sample  additional confirmatory testing may be necessary for epidemiological  and / or clinical management  purposes  to differentiate between  SARS-CoV-2 and other Sarbecovirus  currently known to infect humans.  If clinically indicated additional testing with an alternate test  methodology [id number]) is advised. The SARS-CoV-2 RNA is generally  detectable in upper and lower respiratory sp ecimens during the acute  phase of infection. The expected result is Negative. Fact Sheet for Patients:  StrictlyIdeas.no Fact Sheet for Healthcare Providers: BankingDealers.co.za This test is not yet approved or cleared by the [place] FDA and has been authorized for detection and/or diagnosis of SARS-CoV-2 by FDA under an Emergency Use Authorization (EUA).  This EUA will remain in effect (meaning this test can be used) for the duration of the COVID-19 declaration under Section 564(b)(1) of the Act, 21 U.S.C. section 360bbb-3(b)(1), unless the authorization is terminated or revoked sooner. Performed at [facility], [place] [address], [place], [place] [address]    Ct Angio Chest Pe W And/or Wo Contrast  Result Date: 06/17/2019 CLINICAL DATA:  Shortness of breath EXAM: CT ANGIOGRAPHY CHEST WITH CONTRAST TECHNIQUE: Multidetector CT imaging of the chest was performed using the standard protocol during bolus administration of intravenous contrast. Multiplanar CT image reconstructions and MIPs were obtained to evaluate the vascular anatomy. CONTRAST:  20mL OMNIPAQUE IOHEXOL 350 MG/ML SOLN COMPARISON:  Chest radiograph June 17, 2019 FINDINGS: Cardiovascular: There is no demonstrable pulmonary embolus. The ascending thoracic aorta measures 4.0 x 4.0 cm in transverse diameter. Aorta is mildly tortuous. There are scattered foci of calcification in the visualized great vessels. There are foci of aortic atherosclerosis. No dissection is evident within the aorta. There are foci of coronary artery calcification. There is no appreciable pericardial effusion or pericardial  thickening. Mediastinum/Nodes: Thyroid appears unremarkable. There are scattered subcentimeter mediastinal lymph nodes. There is no adenopathy by size criteria evident. There is no appreciable esophageal lesion. Lungs/Pleura: There is underlying centrilobular emphysematous change. There is atelectatic change in the posterior right base with mild consolidation superimposed in this area. There is mild lower lobe bronchiectatic change. There is peribronchial thickening centrally, slightly more on the right than on the left. There is no appreciable pleural effusion. Upper Abdomen: Visualized upper abdominal structures appear unremarkable except for atherosclerotic calcification in the upper abdominal aorta. Musculoskeletal: There are foci of anterior wedging of the T5 and T8 vertebral bodies, age uncertain. No blastic or lytic bone lesions are evident. There are no appreciable chest wall lesions. Review of the MIP images confirms the above findings. IMPRESSION: 1.  No demonstrable pulmonary embolus. 2. Prominence of the ascending thoracic aorta, measuring 4.0 x 4.0 cm in transverse diameter. No dissection evident. Foci of aortic atherosclerosis noted as well as foci of great vessel and coronary artery calcification. Recommend annual imaging followup by CTA or MRA. This recommendation follows 2010 ACCF/AHA/AATS/ACR/ASA/SCA/SCAI/SIR/STS/SVM Guidelines for the Diagnosis and Management of Patients with Thoracic Aortic Disease. Circulation. 2010; 121: H474-Q595. Aortic aneurysm NOS (ICD10-I71.9). 3. Centrilobular emphysematous change. Atelectasis with mild associated pneumonia posterior right base. Areas of central bronchitis, slightly more on the right than on the left, as well as mild lower lobe bronchiectatic change bilaterally. 4.  No demonstrable adenopathy. Aortic Atherosclerosis (ICD10-I70.0) and Emphysema (ICD10-J43.9). Electronically Signed   By: Lowella Grip III M.D.   On: 06/17/2019 15:22   Dg Chest  Portable 1 View  Result Date: 06/17/2019 CLINICAL DATA:  Shortness of breath and cough for the past 2 days. EXAM: PORTABLE CHEST 1 VIEW COMPARISON:  Chest x-ray dated July 27, 2018. FINDINGS: The heart size and mediastinal contours are within normal limits. Atherosclerotic calcification of the aortic arch.  Normal pulmonary vascularity. The lungs remain hyperinflated with emphysematous changes and mildly coarsened interstitial markings. No focal consolidation, pleural effusion, or pneumothorax. No acute osseous abnormality. IMPRESSION: 1. No active disease. 2. COPD. Electronically Signed   By: Titus Dubin M.D.   On: 06/17/2019 12:50    Pending Labs Unresulted Labs (From admission, onward)    Start     Ordered   06/18/19 0500  Comprehensive metabolic panel  Tomorrow morning,   R     06/17/19 1749   06/18/19 0500  CBC  Tomorrow morning,   R     06/17/19 1749   06/17/19 1748  HIV antibody (Routine Testing)  Once,   STAT     06/17/19 1749          Vitals/Pain Today's Vitals   06/17/19 2230 06/17/19 2245 06/17/19 2300 06/17/19 2315  BP: 129/82 123/86 117/83 (!) 137/97  Pulse: (!) 120 (!) 111 (!) 103 (!) 113  Resp:  20    Temp:      TempSrc:      SpO2: 92% 92% 95% 94%  Weight:      Height:      PainSc:        Isolation Precautions Droplet and Contact precautions  Medications Medications  ALPRAZolam (XANAX) tablet 1 mg (has no administration in time range)  amLODipine (NORVASC) tablet 10 mg (has no administration in time range)  ARIPiprazole (ABILIFY) tablet 2 mg (has no administration in time range)  buPROPion (WELLBUTRIN XL) 24 hr tablet 150 mg (has no administration in time range)  Ipratropium-Albuterol (COMBIVENT) respimat 1 puff (has no administration in time range)  methocarbamol (ROBAXIN) tablet 750 mg (has no administration in time range)  pantoprazole (PROTONIX) EC tablet 40 mg (has no administration in time range)  tiotropium (SPIRIVA) inhalation capsule ([facility] use  ONLY) 18 mcg (has no administration in time range)  methylPREDNISolone sodium succinate (SOLU-MEDROL) 40 mg/mL injection 40 mg (has no administration in time range)  albuterol (PROVENTIL) (2.5 MG/3ML) 0.083% nebulizer solution 2.5 mg (2.5 mg Nebulization Given 06/17/19 2030)  levofloxacin (LEVAQUIN) IVPB 500 mg (has no administration in time range)  enoxaparin (LOVENOX) injection 40 mg (has no administration in time range)  albuterol (VENTOLIN HFA) 108 (90 Base) MCG/ACT inhaler 6 puff (6 puffs Inhalation Given 06/17/19 1229)  ipratropium (ATROVENT HFA) inhaler 2 puff (2 puffs Inhalation Given 06/17/19 1328)  sodium chloride 0.9 % bolus 500 mL (0 mLs Intravenous Stopped 06/17/19 1550)  methylPREDNISolone sodium succinate (SOLU-MEDROL) 125 mg/2 mL injection 125 mg (125 mg Intravenous Given 06/17/19 1329)  albuterol (VENTOLIN HFA) 108 (90 Base) MCG/ACT inhaler 4 puff (4 puffs Inhalation Given 06/17/19 1404)  ipratropium (ATROVENT HFA) inhaler 2 puff (2 puffs Inhalation Given 06/17/19 1404)  iohexol (OMNIPAQUE) 350 MG/ML injection 75 mL (75 mLs Intravenous Contrast Given 06/17/19 1505)  levofloxacin (LEVAQUIN) IVPB 750 mg (750 mg Intravenous New Bag/Given 06/17/19 1716)    Mobility walks Low fall risk   Focused Assessments Cardiac Assessment Handoff:  Cardiac Rhythm: Sinus tachycardia Lab Results  Component Value Date   CKTOTAL 76 10/01/2011   CKMB 2.5 10/01/2011   TROPONINI <0.03 04/28/2018   Lab Results  Component Value Date   DDIMER 0.61 (H) 06/17/2019   Does the Patient currently have chest pain? NO    R Recommendations: See Admitting Provider Note  Report given to:   Additional Notes: COVID negative

## 2019-06-17 NOTE — H&P (Addendum)
History and Physical    Brenda Carney RFF:[id number] DOB: 09/29/1951 DOA: 06/17/2019  PCP: Leeroy Cha, MD Patient coming from: Home shortness of breath and cough  Chief Complaint:  HPI: Brenda Carney is a 68 y.o. female with medical history significant of tobacco abuse COPD, CVA, GERD, asthma, hypertension, sleep apnea, hyperlipidemia came into the ER with complaints of increasing shortness of breath and cough for the last few days prior to admission to hospital.  Her pulse ox dropped to 88% with activity.  She reports she went back to smoking last weekend after quitting for some time.  She reported increased wheezing.  She denies any COVID contacts though she was [place] with her friends in [place].  She denies any abdominal pain nausea vomiting diarrhea urinary complaints.  She does not have oxygen at home.  ED Course: Patient received steroids and Levaquin and nebulizer treatments in the ER.  Sodium 138 potassium 3.8 BUN 5 creatinine 0.87 white count 13.6 hemoglobin 15.3 platelet count 362 her d-dimer was 0.61, CT of the chest shows no evidence of pulmonary embolism.  Review of Systems: As per HPI otherwise all other systems reviewed and are negative  Ambulatory Status: Ambulatory at baseline  Past Medical History:  Diagnosis Date   Anemia    as a younger woman   Arthritis    "thumbs; hips" (05/14/2017)   Cerebral aneurysm    Right ophthalmic artery, left callosal marginal anterior cerebral artery branch   Cerebrovascular disease    Carotid dopplers which showed no significant increase in velocities, extremely minimal on the left, antegrade vertebral bilaterally.   Cervical spondylosis    Chronic lower back pain    COPD (chronic obstructive pulmonary disease) (HCC)    "little bit" (05/14/2017)   Coronary artery disease, non-occlusive    Coronary calcium scoring: June 2018: Score 1106. 98 percentile for age -> LOW RISK MYOVIEW   Depression     Exercise-induced asthma with acute exacerbation    "only in very hot weather" (05/14/2017)   GERD (gastroesophageal reflux disease)    Headache(784.0)    "monthly" (05/14/2017)   History of IBS    resovlved   History of kidney stones    Hx of multiple concussions    from horse training and riding   Hyperlipidemia with target LDL less than 70    Mostly statin intolerant,. Currently on Livalo 4 mg   Hypertension, benign    Lumbosacral spondylosis    Migraine headache    "were monthly; none recently" (05/14/2017)   PAD (peripheral artery disease) (HCC)    Status post right above-the-knee-below the knee popliteal artery bypass; postop right ABI 0.96.   Pneumonia "several times"   Sleep apnea    does not wear CPAP; "think it was medication related" (05/14/2017)   Thoracic aortic ectasia (HCC)    ~40 mm on Coronary Calcium Score CT - recommend f/u CTA or MRA   TIA (transient ischemic attack) 09/2011   "several"    Past Surgical History:  Procedure Laterality Date   ABDOMINAL AORTOGRAM W/LOWER EXTREMITY N/A 05/14/2017   Procedure: Abdominal Aortogram w/Lower Extremity;  Surgeon: Conrad [place], MD;  Location: [facility];  Service: Cardiovascular;  Laterality: N/A;   ACROMIO-CLAVICULAR JOINT REPAIR Left 1990s   "I think it was called an AC joint removal"   BACK SURGERY     BYPASS GRAFT POPLITEAL TO POPLITEAL Right 05/15/2017   Procedure: BYPASS GRAFT ABOVE KNEE POPLITEAL TO BELOW KNEE POPLITEAL ARTERY;  Surgeon: Conrad [place], MD;  Location: [facility];  Service: Vascular;  Laterality: Right;   ESOPHAGOGASTRODUODENOSCOPY (EGD) WITH PROPOFOL N/A 04/29/2018   Procedure: ESOPHAGOGASTRODUODENOSCOPY (EGD) WITH PROPOFOL;  Surgeon: Otis Brace, MD;  Location: [facility];  Service: Gastroenterology;  Laterality: N/A;   FOOT FRACTURE SURGERY Bilateral    multiple procedures   FRACTURE SURGERY     MAXIMUM ACCESS (MAS)POSTERIOR LUMBAR INTERBODY FUSION (PLIF) 1 LEVEL N/A  09/30/2014   Procedure: FOR MAXIMUM ACCESS (MAS) POSTERIOR LUMBAR INTERBODY FUSION (PLIF) 1 LEVEL;  Surgeon: Eustace Moore, MD;  Location: [facility];  Service: Neurosurgery;  Laterality: N/A;  FOR MAXIMUM ACCESS (MAS) POSTERIOR LUMBAR INTERBODY FUSION (PLIF) 1 LEVEL LUMBAR 4-5   MULTIPLE TOOTH EXTRACTIONS Right 04/2017   NM MYOVIEW LTD  06/2017    Normal EF, 66%. No ST changes. Normal study. LOW RISK.   TRANSTHORACIC ECHOCARDIOGRAM  09/07/2013   Done to evaluate TIA: Normal LV size and function. EF 55-60%. GR 1 DD. Mild left atrial dilation. Mildly calcified mitral leaflets. Otherwise normal.    Social History   Socioeconomic History   Marital status: Widowed    Spouse name: [name]    Number of children: 0   Years of education: COLLEGE   Highest education level: Not on file  Occupational History   Occupation: Horse Programmer, multimedia strain: Not on file   Food insecurity    Worry: Not on file    Inability: Not on file   Transportation needs    Medical: Not on file    Non-medical: Not on file  Tobacco Use   Smoking status: Light Tobacco Smoker    Packs/day: 0.02    Years: 36.00    Pack years: 0.72    Types: Cigarettes    Last attempt to quit: 04/24/2017    Years since quitting: 2.1   Smokeless tobacco: Never Used  Substance and Sexual Activity   Alcohol use: Yes    Alcohol/week: 3.0 standard drinks    Types: 3 Glasses of wine per week   Drug use: No   Sexual activity: Never  Lifestyle   Physical activity    Days per week: Not on file    Minutes per session: Not on file   Stress: Not on file  Relationships   Social connections    Talks on phone: Not on file    Gets together: Not on file    Attends religious service: Not on file    Active member of club or organization: Not on file    Attends meetings of clubs or organizations: Not on file    Relationship status: Not on file   Intimate partner violence    Fear of  current or ex partner: Not on file    Emotionally abused: Not on file    Physically abused: Not on file    Forced sexual activity: Not on file  Other Topics Concern   Not on file  Social History Narrative   Patient lives at home with her husband [name]. Patient has no children.    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    Patient has BA degree.    Smokes 2-3 cigarettes /day -- former heavy smoker (failed "quitting") -- now says she has truly "QUIT" - however now is been going back and forth between quitting and cheating and probably smokes anywhere from 0-3 cigarettes a day.    Allergies  Allergen  Reactions   Penicillins Swelling and Other (See Comments)    TONGUE SWELLING  PATIENT HAD A PCN REACTION WITH IMMEDIATE RASH, FACIAL/TONGUE/THROAT SWELLING, SOB, OR LIGHTHEADEDNESS WITH HYPOTENSION:  #  #  #  YES  #  #  #   Has patient had a PCN reaction causing severe rash involving mucus membranes or skin necrosis: No Has patient had a PCN reaction that required hospitalization: No Has patient had a PCN reaction occurring within the last 10 years: No    Lyrica [Pregabalin] Other (See Comments)    DRY MOUTH    Family History  Problem Relation Age of Onset   Stroke Father    Heart attack Mother    Stroke Mother     Prior to Admission medications   Medication Sig Start Date End Date Taking? Authorizing Provider  ALPRAZolam Duanne Moron) 1 MG tablet Take 1 mg by mouth at bedtime as needed for anxiety.    [provider]  amLODipine (NORVASC) 2.5 MG tablet Take 1 tablet (2.5 mg total) by mouth daily. 04/30/18   Dana Allan I, MD  ARIPiprazole (ABILIFY) 2 MG tablet Take 2 mg by mouth daily.    [provider]  budesonide-formoterol (SYMBICORT) 160-4.5 MCG/ACT inhaler Inhale 2 puffs into the lungs 2 (two) times daily. 04/30/18   Dana Allan I, MD  buPROPion (WELLBUTRIN XL) 150 MG 24 hr tablet Take 150 mg by mouth daily. 04/04/18   [provider]  desvenlafaxine (PRISTIQ) 100 MG 24 hr tablet Take 100 mg by mouth daily.    [provider]  fenofibrate (TRICOR) 145 MG tablet Take 145 mg by mouth daily.  08/23/15   [provider]  ferrous sulfate 325 (65 FE) MG tablet Take 1 tablet (325 mg total) by mouth daily. 04/30/18 06/17/19  Bonnell Public, MD  Ipratropium-Albuterol (COMBIVENT RESPIMAT) 20-100 MCG/ACT AERS respimat Inhale 1 puff into the lungs every 6 (six) hours as needed for wheezing. 04/30/18   Bonnell Public, MD  methocarbamol (ROBAXIN) 750 MG tablet Take 1 tablet (750 mg total) by mouth 3 (three) times daily as needed for muscle spasms. 05/07/18   Lajean Saver, MD  mometasone (NASONEX) 50 MCG/ACT nasal spray Place 2 sprays into the nose daily as needed (allergies).    [provider]  Multiple Vitamin (MULTIVITAMIN WITH MINERALS) TABS tablet Take 1 tablet by mouth daily.    [provider]  mupirocin ointment (BACTROBAN) 2 % Place 1 application into the nose 2 (two) times daily as needed (For wound care.).    [provider]  pantoprazole (PROTONIX) 40 MG tablet Take 1 tablet (40 mg total) by mouth 2 (two) times daily. 04/30/18   Bonnell Public, MD  Pitavastatin Calcium 4 MG TABS Take 4 mg by mouth daily.  06/07/17   Leonie Man, MD  potassium chloride (K-DUR,KLOR-CON) 10 MEQ tablet Take 10 mEq by mouth daily.    [provider]  predniSONE (DELTASONE) 20 MG tablet 3 po once a day for 2 days, then 2 po once a day for 3 days, then 1 po once a day for 3 days 05/08/18   Lajean Saver, MD  Propylene Glycol-Glycerin (SOOTHE OP) Apply 1 drop to eye daily as needed (dry eyes).    [provider]  REPATHA SURECLICK 751 MG/ML SOAJ INJECT 140MG  INTO THE SKIN EVERY 14 DAYS. Patient taking differently: Take 140 mg by mouth every 14 (fourteen) days.  05/22/19   Leonie Man, MD  tiotropium (SPIRIVA HANDIHALER) 18 MCG inhalation capsule Place 1 capsule (18  mcg total) into inhaler and inhale daily. 04/30/18 06/17/19  Dana Allan I, MD  zolpidem (AMBIEN) 5 MG tablet Take 1 tablet (5 mg total) by mouth at bedtime. 04/30/18   Bonnell Public, MD    Physical Exam: Vitals:   06/17/19 1148 06/17/19 1209 06/17/19 1229  BP: (!) 166/94    Pulse: (!) 112    Resp: (!) 24    Temp: 98.7 F (37.1 C)    TempSrc: Oral    SpO2: 96% 94% 92%  Weight:  68 kg   Height:  5\' 5"  (1.651 m)       General:  Appears calm and comfortable  Eyes: PERRL, EOMI, normal lids, iris  ENT: grossly normal hearing, lips & tongue, mmm  Neck:  no LAD, masses or thyromegaly  Cardiovascular: RRR, no m/r/g. No LE edema.   Respiratory: Wheezing bilaterally, no w/r/r. Normal respiratory effort.  Abdomen:  soft, ntnd, NABS  Skin:  no rash or induration seen on limited exam  Musculoskeletal:  grossly normal tone BUE/BLE, good ROM, no bony abnormality  Psychiatric:  grossly normal mood and affect, speech fluent and appropriate, AOx3  Neurologic:  CN 2-12 grossly intact, moves all extremities in coordinated fashion, sensation intact  Labs on Admission: I have personally reviewed following labs and imaging studies  CBC: Recent Labs  Lab 06/17/19 1207  WBC 13.6*  NEUTROABS 10.3*  HGB 15.3*  HCT 44.3  MCV 96.1  PLT 161   Basic Metabolic Panel: Recent Labs  Lab 06/17/19 1207  NA 138  K 3.8  CL 104  CO2 22  GLUCOSE 116*  BUN 5*  CREATININE 0.87  CALCIUM 9.9   GFR: Estimated Creatinine Clearance: 55.7 mL/min (by C-G formula based on SCr of 0.87 mg/dL). Liver Function Tests: No results for input(s): AST, ALT, ALKPHOS, BILITOT, PROT, ALBUMIN in the last 168 hours. No results for input(s): LIPASE, AMYLASE in the last 168 hours. No results for input(s): AMMONIA in the last 168 hours. Coagulation Profile: No results for input(s): INR, PROTIME in the last 168 hours. Cardiac Enzymes: No results for input(s): CKTOTAL, CKMB, CKMBINDEX, TROPONINI in the  last 168 hours. BNP (last 3 results) No results for input(s): PROBNP in the last 8760 hours. HbA1C: No results for input(s): HGBA1C in the last 72 hours. CBG: No results for input(s): GLUCAP in the last 168 hours. Lipid Profile: No results for input(s): CHOL, HDL, LDLCALC, TRIG, CHOLHDL, LDLDIRECT in the last 72 hours. Thyroid Function Tests: No results for input(s): TSH, T4TOTAL, FREET4, T3FREE, THYROIDAB in the last 72 hours. Anemia Panel: No results for input(s): VITAMINB12, FOLATE, FERRITIN, TIBC, IRON, RETICCTPCT in the last 72 hours. Urine analysis:    Component Value Date/Time   COLORURINE COLORLESS (A) 04/29/2018 0410   APPEARANCEUR CLEAR 04/29/2018 0410   LABSPEC 1.008 04/29/2018 0410   PHURINE 7.0 04/29/2018 0410   GLUCOSEU NEGATIVE 04/29/2018 0410   HGBUR SMALL (A) 04/29/2018 0410   BILIRUBINUR NEGATIVE 04/29/2018 0410   KETONESUR NEGATIVE 04/29/2018 0410   PROTEINUR NEGATIVE 04/29/2018 0410   UROBILINOGEN 0.2 10/02/2011 0224   NITRITE NEGATIVE 04/29/2018 0410   LEUKOCYTESUR NEGATIVE 04/29/2018 0410    Creatinine Clearance: Estimated Creatinine Clearance: 55.7 mL/min (by C-G formula based on SCr of 0.87 mg/dL).  Sepsis Labs: @LABRCNTIP (procalcitonin:4,lacticidven:4) ) Recent Results (from the past 240 hour(s))  SARS Coronavirus 2 (CEPHEID- Performed in [facility] hospital lab), Hosp Order     Status:  None   Collection Time: 06/17/19 12:17 PM   Specimen: Nasopharyngeal Swab  Result Value Ref Range Status   SARS Coronavirus 2 NEGATIVE NEGATIVE Final    Comment: (NOTE) If result is NEGATIVE SARS-CoV-2 target nucleic acids are NOT DETECTED. The SARS-CoV-2 RNA is generally detectable in upper and lower  respiratory specimens during the acute phase of infection. The lowest  concentration of SARS-CoV-2 viral copies this assay can detect is 250  copies / mL. A negative result does not preclude SARS-CoV-2 infection  and should not be used as the sole basis for  treatment or other  patient management decisions.  A negative result may occur with  improper specimen collection / handling, submission of specimen other  than nasopharyngeal swab, presence of viral mutation(s) within the  areas targeted by this assay, and inadequate number of viral copies  (<250 copies / mL). A negative result must be combined with clinical  observations, patient history, and epidemiological information. If result is POSITIVE SARS-CoV-2 target nucleic acids are DETECTED. The SARS-CoV-2 RNA is generally detectable in upper and lower  respiratory specimens dur ing the acute phase of infection.  Positive  results are indicative of active infection with SARS-CoV-2.  Clinical  correlation with patient history and other diagnostic information is  necessary to determine patient infection status.  Positive results do  not rule out bacterial infection or co-infection with other viruses. If result is PRESUMPTIVE POSTIVE SARS-CoV-2 nucleic acids MAY BE PRESENT.   A presumptive positive result was obtained on the submitted specimen  and confirmed on repeat testing.  While 2019 novel coronavirus  (SARS-CoV-2) nucleic acids may be present in the submitted sample  additional confirmatory testing may be necessary for epidemiological  and / or clinical management purposes  to differentiate between  SARS-CoV-2 and other Sarbecovirus currently known to infect humans.  If clinically indicated additional testing with an alternate test  methodology [phone]) is advised. The SARS-CoV-2 RNA is generally  detectable in upper and lower respiratory sp ecimens during the acute  phase of infection. The expected result is Negative. Fact Sheet for Patients:  StrictlyIdeas.no Fact Sheet for Healthcare Providers: BankingDealers.co.za This test is not yet approved or cleared by the [place] FDA and has been authorized for detection and/or  diagnosis of SARS-CoV-2 by FDA under an Emergency Use Authorization (EUA).  This EUA will remain in effect (meaning this test can be used) for the duration of the COVID-19 declaration under Section 564(b)(1) of the Act, 21 U.S.C. section 360bbb-3(b)(1), unless the authorization is terminated or revoked sooner. Performed at [facility], [facility] [address] [place], [place] [address]      Radiological Exams on Admission: Ct Angio Chest Pe W And/or Wo Contrast  Result Date: 06/17/2019 CLINICAL DATA:  Shortness of breath EXAM: CT ANGIOGRAPHY CHEST WITH CONTRAST TECHNIQUE: Multidetector CT imaging of the chest was performed using the standard protocol during bolus administration of intravenous contrast. Multiplanar CT image reconstructions and MIPs were obtained to evaluate the vascular anatomy. CONTRAST:  53mL OMNIPAQUE IOHEXOL 350 MG/ML SOLN COMPARISON:  Chest radiograph June 17, 2019 FINDINGS: Cardiovascular: There is no demonstrable pulmonary embolus. The ascending thoracic aorta measures 4.0 x 4.0 cm in transverse diameter. Aorta is mildly tortuous. There are scattered foci of calcification in the visualized great vessels. There are foci of aortic atherosclerosis. No dissection is evident within the aorta. There are foci of coronary artery calcification. There is no appreciable pericardial effusion or pericardial thickening. Mediastinum/Nodes: Thyroid appears unremarkable.  There are scattered subcentimeter mediastinal lymph nodes. There is no adenopathy by size criteria evident. There is no appreciable esophageal lesion. Lungs/Pleura: There is underlying centrilobular emphysematous change. There is atelectatic change in the posterior right base with mild consolidation superimposed in this area. There is mild lower lobe bronchiectatic change. There is peribronchial thickening centrally, slightly more on the right than on the left. There is no appreciable pleural effusion. Upper Abdomen: Visualized  upper abdominal structures appear unremarkable except for atherosclerotic calcification in the upper abdominal aorta. Musculoskeletal: There are foci of anterior wedging of the T5 and T8 vertebral bodies, age uncertain. No blastic or lytic bone lesions are evident. There are no appreciable chest wall lesions. Review of the MIP images confirms the above findings. IMPRESSION: 1.  No demonstrable pulmonary embolus. 2. Prominence of the ascending thoracic aorta, measuring 4.0 x 4.0 cm in transverse diameter. No dissection evident. Foci of aortic atherosclerosis noted as well as foci of great vessel and coronary artery calcification. Recommend annual imaging followup by CTA or MRA. This recommendation follows 2010 ACCF/AHA/AATS/ACR/ASA/SCA/SCAI/SIR/STS/SVM Guidelines for the Diagnosis and Management of Patients with Thoracic Aortic Disease. Circulation. 2010; 121: W119-J478. Aortic aneurysm NOS (ICD10-I71.9). 3. Centrilobular emphysematous change. Atelectasis with mild associated pneumonia posterior right base. Areas of central bronchitis, slightly more on the right than on the left, as well as mild lower lobe bronchiectatic change bilaterally. 4.  No demonstrable adenopathy. Aortic Atherosclerosis (ICD10-I70.0) and Emphysema (ICD10-J43.9). Electronically Signed   By: Lowella Grip III M.D.   On: 06/17/2019 15:22   Dg Chest Portable 1 View  Result Date: 06/17/2019 CLINICAL DATA:  Shortness of breath and cough for the past 2 days. EXAM: PORTABLE CHEST 1 VIEW COMPARISON:  Chest x-ray dated July 27, 2018. FINDINGS: The heart size and mediastinal contours are within normal limits. Atherosclerotic calcification of the aortic arch. Normal pulmonary vascularity. The lungs remain hyperinflated with emphysematous changes and mildly coarsened interstitial markings. No focal consolidation, pleural effusion, or pneumothorax. No acute osseous abnormality. IMPRESSION: 1. No active disease. 2. COPD. Electronically Signed   By:  Titus Dubin M.D.   On: 06/17/2019 12:50    Assessment/Plan Active Problems:   CAP (community acquired pneumonia)   #1 acute hypoxic respiratory failure secondary to COPD exacerbation and community-acquired pneumonia-patient admitted with shortness of breath and cough no fever reported.  She had quit smoking many years ago started smoking again this weekend.  She is COVID negative.  Chest x-ray did not reveal any infiltrates but CT angiogram of the chest shows endings consistent with COPD and atelectasis with a mild right basilar pneumonia.  She is allergic to penicillin she has been started on levofloxacin this will be continued along with steroids and nebulizer.   #2 leukocytosis secondary to pneumonia.  #3 hypertension continue Norvasc  #4 anxiety continue Wellbutrin and Abilify?not sure why shes on abilify.she has history of concussions from falling of the horse..   Estimated body mass index is 24.96 kg/m as calculated from the following:   Height as of this encounter: 5\' 5"  (1.651 m).   Weight as of this encounter: 68 kg.   DVT prophylaxis: Lovenox Code Status: Full code Family Communication: None Disposition Plan: Pending clinical improvement Consults called: None Admission status: Observation   Georgette Shell MD [facility]  If 7PM-7AM, please contact night-coverage [email or web address] Password [facility]  06/17/2019, 5:46 PM

## 2019-06-17 NOTE — ED Notes (Signed)
Pt moved from [facility] to room 42.

## 2019-06-18 ENCOUNTER — Other Ambulatory Visit: Payer: Self-pay

## 2019-06-18 DIAGNOSIS — J441 Chronic obstructive pulmonary disease with (acute) exacerbation: Secondary | ICD-10-CM | POA: Diagnosis not present

## 2019-06-18 DIAGNOSIS — J181 Lobar pneumonia, unspecified organism: Secondary | ICD-10-CM | POA: Diagnosis not present

## 2019-06-18 LAB — COMPREHENSIVE METABOLIC PANEL
ALT: 25 U/L (ref 0–44)
AST: 30 U/L (ref 15–41)
Albumin: 4.1 g/dL (ref 3.5–5.0)
Alkaline Phosphatase: 91 U/L (ref 38–126)
Anion gap: 14 (ref 5–15)
BUN: 11 mg/dL (ref 8–23)
CO2: 20 mmol/L — ABNORMAL LOW (ref 22–32)
Calcium: 9.9 mg/dL (ref 8.9–10.3)
Chloride: 102 mmol/L (ref 98–111)
Creatinine, Ser: 0.92 mg/dL (ref 0.44–1.00)
GFR calc Af Amer: >60 mL/min (ref >60)
GFR calc non Af Amer: >60 mL/min (ref >60)
Glucose, Bld: 126 mg/dL — ABNORMAL HIGH (ref 70–99)
Potassium: 4.1 mmol/L (ref 3.5–5.1)
Sodium: 136 mmol/L (ref 135–145)
Total Bilirubin: 0.9 mg/dL (ref 0.3–1.2)
Total Protein: 7.4 g/dL (ref 6.5–8.1)

## 2019-06-18 LAB — LACTIC ACID, PLASMA: Lactic Acid, Venous: 1.2 mmol/L (ref 0.5–1.9)

## 2019-06-18 LAB — CBC
HCT: 47.5 % — ABNORMAL HIGH (ref 36.0–46.0)
Hemoglobin: 16.3 g/dL — ABNORMAL HIGH (ref 12.0–15.0)
MCH: 33.3 pg (ref 26.0–34.0)
MCHC: 34.3 g/dL (ref 30.0–36.0)
MCV: 96.9 fL (ref 80.0–100.0)
Platelets: 381 10*3/uL (ref 150–400)
RBC: 4.9 MIL/uL (ref 3.87–5.11)
RDW: 13.3 % (ref 11.5–15.5)
WBC: 12 10*3/uL — ABNORMAL HIGH (ref 4.0–10.5)
nRBC: 0 % (ref 0.0–0.2)

## 2019-06-18 LAB — HIV ANTIBODY (ROUTINE TESTING W REFLEX): HIV Screen 4th Generation wRfx: NONREACTIVE

## 2019-06-18 LAB — PROCALCITONIN: Procalcitonin: <0.1 ng/mL

## 2019-06-18 MED ORDER — BENZONATATE 100 MG PO CAPS
200.0000 mg | ORAL_CAPSULE | Freq: Two times a day (BID) | ORAL | Status: DC | PRN
  Administered 2019-06-18: 200 mg via ORAL
  Filled 2019-06-18: qty 2

## 2019-06-18 MED ORDER — LEVOFLOXACIN 500 MG PO TABS: 500.0000 mg | ORAL_TABLET | Freq: Every day | ORAL | 0 refills | Status: AC

## 2019-06-18 MED ORDER — LEVOFLOXACIN 500 MG PO TABS
500.0000 mg | ORAL_TABLET | Freq: Every day | ORAL | Status: DC
  Administered 2019-06-18: 500 mg via ORAL
  Filled 2019-06-18: qty 1

## 2019-06-18 MED ORDER — BENZONATATE 200 MG PO CAPS: 200.0000 mg | ORAL_CAPSULE | Freq: Two times a day (BID) | ORAL | 0 refills | Status: AC | PRN

## 2019-06-18 MED ORDER — OXYCODONE-ACETAMINOPHEN 7.5-325 MG PO TABS
1.0000 | ORAL_TABLET | Freq: Once | ORAL | Status: AC | PRN
  Administered 2019-06-18: 1 via ORAL
  Filled 2019-06-18: qty 1

## 2019-06-18 MED ORDER — PREDNISONE 20 MG PO TABS: 40.0000 mg | ORAL_TABLET | Freq: Every day | ORAL | 0 refills | Status: AC

## 2019-06-18 MED ORDER — ALBUTEROL SULFATE (2.5 MG/3ML) 0.083% IN NEBU: 2.5000 mg | INHALATION_SOLUTION | Freq: Two times a day (BID) | RESPIRATORY_TRACT | 0 refills | Status: DC | PRN

## 2019-06-18 MED ORDER — PREDNISONE 20 MG PO TABS
40.0000 mg | ORAL_TABLET | Freq: Every day | ORAL | Status: DC
  Administered 2019-06-18: 40 mg via ORAL
  Filled 2019-06-18: qty 2

## 2019-06-18 MED ORDER — ZOLPIDEM TARTRATE 5 MG PO TABS: 5.0000 mg | ORAL_TABLET | Freq: Every evening | ORAL | Status: DC | PRN

## 2019-06-18 MED ORDER — AMLODIPINE BESYLATE 10 MG PO TABS: 10.0000 mg | ORAL_TABLET | Freq: Every day | ORAL | 0 refills | Status: DC

## 2019-06-18 NOTE — Plan of Care (Signed)
  Problem: Clinical Measurements: Goal: Will remain free from infection Outcome: Progressing   Problem: Elimination: Goal: Will not experience complications related to bowel motility Outcome: Progressing   Problem: Pain Managment: Goal: General experience of comfort will improve Outcome: Progressing   

## 2019-06-18 NOTE — Progress Notes (Signed)
SATURATION QUALIFICATIONS: (This note is used to comply with regulatory documentation for home oxygen)  Patient Saturations on Room Air at Rest = 91%  Patient Saturations on Room Air while Ambulating = 90-93%  Patient Saturations on 0 Liters of oxygen while Ambulating = N/A  Please briefly explain why patient needs home oxygen:

## 2019-06-18 NOTE — TOC Transition Note (Addendum)
Transition of Care [facility]) - CM/SW Discharge Note   Patient Details  Name: Brenda Carney MRN: [id number] Date of Birth: 1951/08/28  Transition of Care [facility]) CM/SW Contact:  Zenon Mayo, RN Phone Number: 06/18/2019, 10:23 AM   Clinical Narrative:    From home, for dc today, will need neb machine, NCM contacted Zack with [employer], he will bring neb machine to patient room prior to dc. NCM spoke with patient and informed her of this information. Patient states she has transportation and she has no problem with getting medications.   Final next level of care: Home/Self Care Barriers to Discharge: No Barriers Identified   Patient Goals and CMS Choice Patient states their goals for this hospitalization and ongoing recovery are:: to never touch a cigareet again   Choice offered to / list presented to : NA  Discharge Placement                       Discharge Plan and Services                DME Arranged: Nebulizer machine DME Agency: [employer] Date DME Agency Contacted: 06/18/19 Time DME Agency Contacted: [id number] Representative spoke with at DME Agency: zack HH Arranged: NA          Social Determinants of Health ([place]) Interventions     Readmission Risk Interventions No flowsheet data found.

## 2019-06-18 NOTE — Progress Notes (Addendum)
Patients O2 sats remained above 90% while ambulating in the hallway. She denies SOB during the walking O2 sat test. Will proceed with discharge per Dr. Nevada Crane order. Home narcotics returned to patient and count confirmed with patient.

## 2019-06-18 NOTE — Discharge Summary (Signed)
Discharge Summary  Brenda Carney NLG:[id number] DOB: 01-13-1951  PCP: Leeroy Cha, MD  Admit date: 06/17/2019 Discharge date: 06/18/2019  Time spent: 35 minutes  Recommendations for Outpatient Follow-up:  1. Follow-up with your primary care provider 2. Take your medications as prescribed 3. Abstain from tobacco use  Discharge Diagnoses:  Active Hospital Problems   Diagnosis Date Noted   CAP (community acquired pneumonia) 06/17/2019   Leukocytosis 06/17/2019   Pneumonia    COPD with acute exacerbation [facility])     Resolved Hospital Problems  No resolved problems to display.    Discharge Condition: Stable  Diet recommendation: Resume previous diet.  Vitals:   06/18/19 0725 06/18/19 0824  BP:  (!) 124/91  Pulse:  100  Resp:    Temp:  98.2 F (36.8 C)  SpO2: 95% 95%    History of present illness:  Brenda Carney is a 67 y.o. female with medical history significant of tobacco abuse, COPD, CVA, GERD, asthma, hypertension, sleep apnea, hyperlipidemia came into the ER with complaints of increasing shortness of breath and cough for the last few days prior to admission to hospital.  Her pulse ox dropped to 88% with activity.  She reports she went back to smoking last weekend after quitting for some time.  She reported increased wheezing.  She denies any COVID contacts though she was [place] with her friends in [place].  She denies any abdominal pain nausea vomiting diarrhea urinary complaints.  She does not have oxygen at home.  CTA chest shows no evidence of pulmonary embolism however shows mild pulmonary infiltrates in the right lower lobe.  Admitted for acute COPD exacerbation and community-acquired pneumonia.  Started on IV Levaquin which she tolerated well.  She has a penicillin allergy.  06/18/19: Patient seen and examined at her bedside.  No acute events overnight.  O2 saturation much improved.  Vital signs and labs reviewed and are stable.  Procalcitonin  less than 0.10, lactic acid 1.2 on 06/18/2019.  Leukocytosis is resolving.  Afebrile.  On the day of discharge, the patient was hemodynamically stable.  She will need to follow-up with her primary care provider and completely abstain from tobacco use.  Patient declines prescription for nicotine patch and stated that she will be able to quit smoking on her own.  Also stated that she has mint that has been helpful with tobacco cravings.    Hospital Course:  Active Problems:   Pneumonia   COPD with acute exacerbation ([place])   CAP (community acquired pneumonia)   Leukocytosis   Acute COPD exacerbation in the setting of community-acquired pneumonia Presented with a productive cough and leukocytosis CTA chest ruled out PE. Independent review of CT chest which showed mild infiltrates in the right lower lobe. Started on IV Levaquin which she tolerated well Afebrile, leukocytosis resolving Continue p.o. Levaquin 500 mg daily x5 days Has completed 2 days of Levaquin in the hospital Also completed dose of IV steroids Continue prednisone 40 mg daily x5 days Continue nebulizer treatment twice daily as needed for wheezing or shortness of breath Continue Tessalon Perles as needed twice daily Complete abstain from tobacco use  Acute hypoxic respiratory failure likely secondary to COPD exacerbation Presented with O2 saturation in the 80s Improved on 2 L of oxygen by nasal cannula Home O2 evaluation prior to discharge  Tobacco use disorder Has quit in the past and relapsed over the weekend Advised to completely abstain from tobacco use Patient understands and agrees to plan.  Declines nicotine  patch and stated that she will be able to quit on her own.  Hypertension Blood pressure is normotensive Continue Norvasc 10 mg daily Hold off lisinopril to avoid hypotension.  Chronic anxiety/depression No acute issues Continue home medications   Procedures:  None    Consultations:  None  Discharge Exam: BP (!) 124/91 (BP Location: Left Arm)    Pulse 100    Temp 98.2 F (36.8 C) (Oral)    Resp 20    Ht 5\' 5"  (1.651 m)    Wt 69.8 kg    SpO2 95%    BMI 25.61 kg/m   General: 68 y.o. year-old female well developed well nourished in no acute distress.  Alert and oriented x3.  Cardiovascular: Regular rate and rhythm with no rubs or gallops.  No thyromegaly or JVD noted.    Respiratory: Clear to auscultation with no wheezes or rales. Good inspiratory effort.  Abdomen: Soft nontender nondistended with normal bowel sounds x4 quadrants.  Musculoskeletal: No lower extremity edema. 2/4 pulses in all 4 extremities.  Skin: No ulcerative lesions noted or rashes,  Psychiatry: Mood is appropriate for condition and setting  Discharge Instructions You were cared for by a hospitalist during your hospital stay. If you have any questions about your discharge medications or the care you received while you were in the hospital after you are discharged, you can call the unit and asked to speak with the hospitalist on call if the hospitalist that took care of you is not available. Once you are discharged, your primary care physician will handle any further medical issues. Please note that NO REFILLS for any discharge medications will be authorized once you are discharged, as it is imperative that you return to your primary care physician (or establish a relationship with a primary care physician if you do not have one) for your aftercare needs so that they can reassess your need for medications and monitor your lab values.   Allergies as of 06/18/2019      Reactions   Penicillins Swelling, Other (See Comments)   TONGUE SWELLING PATIENT HAD A PCN REACTION WITH IMMEDIATE RASH, FACIAL/TONGUE/THROAT SWELLING, SOB, OR LIGHTHEADEDNESS WITH HYPOTENSION:  #  #  #  YES  #  #  #   Has patient had a PCN reaction causing severe rash involving mucus membranes or skin necrosis:  No Has patient had a PCN reaction that required hospitalization: No Has patient had a PCN reaction occurring within the last 10 years: No   Statins    muscl pain   Lyrica [pregabalin] Other (See Comments)   DRY MOUTH      Medication List    STOP taking these medications   ferrous sulfate 325 (65 FE) MG tablet   Ipratropium-Albuterol 20-100 MCG/ACT Aers respimat Commonly known as: Combivent Respimat   lisinopril 10 MG tablet Commonly known as: ZESTRIL   oxyCODONE-acetaminophen 7.5-325 MG tablet Commonly known as: PERCOCET   potassium chloride 10 MEQ tablet Commonly known as: K-DUR   trimethoprim 100 MG tablet Commonly known as: TRIMPEX     TAKE these medications   ALPRAZolam 1 MG tablet Commonly known as: XANAX Take 1 mg by mouth at bedtime as needed for anxiety.   amLODipine 10 MG tablet Commonly known as: NORVASC Take 1 tablet (10 mg total) by mouth daily. What changed:   medication strength  how much to take  Another medication with the same name was removed. Continue taking this medication, and follow the directions you see  here.   Jearl Klinefelter Ellipta 62.5-25 MCG/INH Aepb Generic drug: umeclidinium-vilanterol Inhale 2 puffs into the lungs daily as needed.   ARIPiprazole 2 MG tablet Commonly known as: ABILIFY Take 2 mg by mouth daily.   b complex vitamins tablet Take 1 tablet by mouth daily.   benzonatate 200 MG capsule Commonly known as: TESSALON Take 1 capsule (200 mg total) by mouth 2 (two) times daily as needed for up to 5 days for cough.   budesonide-formoterol 160-4.5 MCG/ACT inhaler Commonly known as: Symbicort Inhale 2 puffs into the lungs 2 (two) times daily.   buPROPion 150 MG 24 hr tablet Commonly known as: WELLBUTRIN XL Take 150 mg by mouth daily.   CALTRATE 600+D3 PO Take 1 tablet by mouth daily before breakfast.   desvenlafaxine 100 MG 24 hr tablet Commonly known as: PRISTIQ Take 100 mg by mouth daily.   Estradiol 10 MCG Tabs  vaginal tablet Place 10 mcg vaginally 2 (two) times a week.   fluticasone 50 MCG/ACT nasal spray Commonly known as: FLONASE Place 2 sprays into both nostrils daily.   levofloxacin 500 MG tablet Commonly known as: LEVAQUIN Take 1 tablet (500 mg total) by mouth daily for 5 days.   methocarbamol 750 MG tablet Commonly known as: ROBAXIN Take 1 tablet (750 mg total) by mouth 3 (three) times daily as needed for muscle spasms.   mupirocin ointment 2 % Commonly known as: BACTROBAN Apply 1 application topically 2 (two) times daily as needed (for facial irritation).   One-A-Day Womens Formula Tabs Take 1 tablet by mouth daily with breakfast.   pantoprazole 40 MG tablet Commonly known as: PROTONIX Take 1 tablet (40 mg total) by mouth 2 (two) times daily. What changed: when to take this   predniSONE 20 MG tablet Commonly known as: DELTASONE Take 2 tablets (40 mg total) by mouth daily with breakfast for 5 days. What changed:   how much to take  how to take this  when to take this  additional instructions   Repatha SureClick 833 MG/ML Soaj Generic drug: Evolocumab INJECT 140MG  INTO THE SKIN EVERY 14 DAYS. What changed: See the new instructions.   SOOTHE OP Place 1 drop into both eyes 3 (three) times daily as needed (for dry eyes).   tiotropium 18 MCG inhalation capsule Commonly known as: Spiriva HandiHaler Place 1 capsule (18 mcg total) into inhaler and inhale daily.   Ventolin HFA 108 (90 Base) MCG/ACT inhaler Generic drug: albuterol Inhale 2 puffs into the lungs every 4 (four) hours as needed for wheezing or shortness of breath. What changed: Another medication with the same name was added. Make sure you understand how and when to take each.   albuterol (2.5 MG/3ML) 0.083% nebulizer solution Commonly known as: PROVENTIL Take 3 mLs (2.5 mg total) by nebulization 2 (two) times daily as needed for wheezing or shortness of breath. What changed: You were already taking a  medication with the same name, and this prescription was added. Make sure you understand how and when to take each.   zolpidem 10 MG tablet Commonly known as: AMBIEN Take 5-15 mg by mouth See admin instructions. Take 15 mg by mouth at bedtime and an additional 5 mg if still unable to sleep after an hour What changed: Another medication with the same name was removed. Continue taking this medication, and follow the directions you see here.            Durable Medical Equipment  (From admission, onward)  Start     Ordered   06/18/19 0916  For home use only DME Nebulizer machine  Once    Question Answer Comment  Patient needs a nebulizer to treat with the following condition COPD with acute exacerbation ([place])   Length of Need 6 Months      06/18/19 0915         Allergies  Allergen Reactions   Penicillins Swelling and Other (See Comments)    TONGUE SWELLING  PATIENT HAD A PCN REACTION WITH IMMEDIATE RASH, FACIAL/TONGUE/THROAT SWELLING, SOB, OR LIGHTHEADEDNESS WITH HYPOTENSION:  #  #  #  YES  #  #  #   Has patient had a PCN reaction causing severe rash involving mucus membranes or skin necrosis: No Has patient had a PCN reaction that required hospitalization: No Has patient had a PCN reaction occurring within the last 10 years: No    Statins     muscl pain   Lyrica [Pregabalin] Other (See Comments)    DRY MOUTH      The results of significant diagnostics from this hospitalization (including imaging, microbiology, ancillary and laboratory) are listed below for reference.    Significant Diagnostic Studies: Ct Angio Chest Pe W And/or Wo Contrast  Result Date: 06/17/2019 CLINICAL DATA:  Shortness of breath EXAM: CT ANGIOGRAPHY CHEST WITH CONTRAST TECHNIQUE: Multidetector CT imaging of the chest was performed using the standard protocol during bolus administration of intravenous contrast. Multiplanar CT image reconstructions and MIPs were obtained to evaluate the  vascular anatomy. CONTRAST:  33mL OMNIPAQUE IOHEXOL 350 MG/ML SOLN COMPARISON:  Chest radiograph June 17, 2019 FINDINGS: Cardiovascular: There is no demonstrable pulmonary embolus. The ascending thoracic aorta measures 4.0 x 4.0 cm in transverse diameter. Aorta is mildly tortuous. There are scattered foci of calcification in the visualized great vessels. There are foci of aortic atherosclerosis. No dissection is evident within the aorta. There are foci of coronary artery calcification. There is no appreciable pericardial effusion or pericardial thickening. Mediastinum/Nodes: Thyroid appears unremarkable. There are scattered subcentimeter mediastinal lymph nodes. There is no adenopathy by size criteria evident. There is no appreciable esophageal lesion. Lungs/Pleura: There is underlying centrilobular emphysematous change. There is atelectatic change in the posterior right base with mild consolidation superimposed in this area. There is mild lower lobe bronchiectatic change. There is peribronchial thickening centrally, slightly more on the right than on the left. There is no appreciable pleural effusion. Upper Abdomen: Visualized upper abdominal structures appear unremarkable except for atherosclerotic calcification in the upper abdominal aorta. Musculoskeletal: There are foci of anterior wedging of the T5 and T8 vertebral bodies, age uncertain. No blastic or lytic bone lesions are evident. There are no appreciable chest wall lesions. Review of the MIP images confirms the above findings. IMPRESSION: 1.  No demonstrable pulmonary embolus. 2. Prominence of the ascending thoracic aorta, measuring 4.0 x 4.0 cm in transverse diameter. No dissection evident. Foci of aortic atherosclerosis noted as well as foci of great vessel and coronary artery calcification. Recommend annual imaging followup by CTA or MRA. This recommendation follows 2010 ACCF/AHA/AATS/ACR/ASA/SCA/SCAI/SIR/STS/SVM Guidelines for the Diagnosis and  Management of Patients with Thoracic Aortic Disease. Circulation. 2010; 121: T267-T245. Aortic aneurysm NOS (ICD10-I71.9). 3. Centrilobular emphysematous change. Atelectasis with mild associated pneumonia posterior right base. Areas of central bronchitis, slightly more on the right than on the left, as well as mild lower lobe bronchiectatic change bilaterally. 4.  No demonstrable adenopathy. Aortic Atherosclerosis (ICD10-I70.0) and Emphysema (ICD10-J43.9). Electronically Signed   By: Lowella Grip  III M.D.   On: 06/17/2019 15:22   Dg Chest Portable 1 View  Result Date: 06/17/2019 CLINICAL DATA:  Shortness of breath and cough for the past 2 days. EXAM: PORTABLE CHEST 1 VIEW COMPARISON:  Chest x-ray dated July 27, 2018. FINDINGS: The heart size and mediastinal contours are within normal limits. Atherosclerotic calcification of the aortic arch. Normal pulmonary vascularity. The lungs remain hyperinflated with emphysematous changes and mildly coarsened interstitial markings. No focal consolidation, pleural effusion, or pneumothorax. No acute osseous abnormality. IMPRESSION: 1. No active disease. 2. COPD. Electronically Signed   By: Titus Dubin M.D.   On: 06/17/2019 12:50    Microbiology: Recent Results (from the past 240 hour(s))  SARS Coronavirus 2 (CEPHEID- Performed in [place] hospital lab), Hosp Order     Status: None   Collection Time: 06/17/19 12:17 PM   Specimen: Nasopharyngeal Swab  Result Value Ref Range Status   SARS Coronavirus 2 NEGATIVE NEGATIVE Final    Comment: (NOTE) If result is NEGATIVE SARS-CoV-2 target nucleic acids are NOT DETECTED. The SARS-CoV-2 RNA is generally detectable in upper and lower  respiratory specimens during the acute phase of infection. The lowest  concentration of SARS-CoV-2 viral copies this assay can detect is 250  copies / mL. A negative result does not preclude SARS-CoV-2 infection  and should not be used as the sole basis for treatment or other   patient management decisions.  A negative result may occur with  improper specimen collection / handling, submission of specimen other  than nasopharyngeal swab, presence of viral mutation(s) within the  areas targeted by this assay, and inadequate number of viral copies  (<250 copies / mL). A negative result must be combined with clinical  observations, patient history, and epidemiological information. If result is POSITIVE SARS-CoV-2 target nucleic acids are DETECTED. The SARS-CoV-2 RNA is generally detectable in upper and lower  respiratory specimens dur ing the acute phase of infection.  Positive  results are indicative of active infection with SARS-CoV-2.  Clinical  correlation with patient history and other diagnostic information is  necessary to determine patient infection status.  Positive results do  not rule out bacterial infection or co-infection with other viruses. If result is PRESUMPTIVE POSTIVE SARS-CoV-2 nucleic acids MAY BE PRESENT.   A presumptive positive result was obtained on the submitted specimen  and confirmed on repeat testing.  While 2019 novel coronavirus  (SARS-CoV-2) nucleic acids may be present in the submitted sample  additional confirmatory testing may be necessary for epidemiological  and / or clinical management purposes  to differentiate between  SARS-CoV-2 and other Sarbecovirus currently known to infect humans.  If clinically indicated additional testing with an alternate test  methodology [phone]) is advised. The SARS-CoV-2 RNA is generally  detectable in upper and lower respiratory sp ecimens during the acute  phase of infection. The expected result is Negative. Fact Sheet for Patients:  StrictlyIdeas.no Fact Sheet for Healthcare Providers: BankingDealers.co.za This test is not yet approved or cleared by the [place] FDA and has been authorized for detection and/or diagnosis of SARS-CoV-2  by FDA under an Emergency Use Authorization (EUA).  This EUA will remain in effect (meaning this test can be used) for the duration of the COVID-19 declaration under Section 564(b)(1) of the Act, 21 U.S.C. section 360bbb-3(b)(1), unless the authorization is terminated or revoked sooner. Performed at [facility], [place] [address], [place] [address]      Labs: Basic Metabolic Panel: Recent Labs  Lab 06/17/19 1207 06/18/19 0517  NA 138 136  K 3.8 4.1  CL 104 102  CO2 22 20*  GLUCOSE 116* 126*  BUN 5* 11  CREATININE 0.87 0.92  CALCIUM 9.9 9.9   Liver Function Tests: Recent Labs  Lab 06/18/19 0517  AST 30  ALT 25  ALKPHOS 91  BILITOT 0.9  PROT 7.4  ALBUMIN 4.1   No results for input(s): LIPASE, AMYLASE in the last 168 hours. No results for input(s): AMMONIA in the last 168 hours. CBC: Recent Labs  Lab 06/17/19 1207 06/18/19 0517  WBC 13.6* 12.0*  NEUTROABS 10.3*  --   HGB 15.3* 16.3*  HCT 44.3 47.5*  MCV 96.1 96.9  PLT 362 381   Cardiac Enzymes: No results for input(s): CKTOTAL, CKMB, CKMBINDEX, TROPONINI in the last 168 hours. BNP: BNP (last 3 results) No results for input(s): BNP in the last 8760 hours.  ProBNP (last 3 results) No results for input(s): PROBNP in the last 8760 hours.  CBG: No results for input(s): GLUCAP in the last 168 hours.     Signed:  Kayleen Memos, MD [facility] 06/18/2019, 9:35 AM

## 2019-06-24 DIAGNOSIS — M5137 Other intervertebral disc degeneration, lumbosacral region: Secondary | ICD-10-CM | POA: Diagnosis not present

## 2019-06-24 DIAGNOSIS — Z79891 Long term (current) use of opiate analgesic: Secondary | ICD-10-CM | POA: Diagnosis not present

## 2019-06-24 DIAGNOSIS — G894 Chronic pain syndrome: Secondary | ICD-10-CM | POA: Diagnosis not present

## 2019-06-24 DIAGNOSIS — M533 Sacrococcygeal disorders, not elsewhere classified: Secondary | ICD-10-CM | POA: Diagnosis not present

## 2019-06-24 DIAGNOSIS — Z79899 Other long term (current) drug therapy: Secondary | ICD-10-CM | POA: Diagnosis not present

## 2019-06-24 DIAGNOSIS — M961 Postlaminectomy syndrome, not elsewhere classified: Secondary | ICD-10-CM | POA: Diagnosis not present

## 2019-06-26 DIAGNOSIS — I10 Essential (primary) hypertension: Secondary | ICD-10-CM | POA: Diagnosis not present

## 2019-06-26 DIAGNOSIS — J449 Chronic obstructive pulmonary disease, unspecified: Secondary | ICD-10-CM | POA: Diagnosis not present

## 2019-06-26 DIAGNOSIS — F329 Major depressive disorder, single episode, unspecified: Secondary | ICD-10-CM | POA: Diagnosis not present

## 2019-06-26 DIAGNOSIS — G894 Chronic pain syndrome: Secondary | ICD-10-CM | POA: Diagnosis not present

## 2019-06-26 DIAGNOSIS — J189 Pneumonia, unspecified organism: Secondary | ICD-10-CM | POA: Diagnosis not present

## 2019-07-15 DIAGNOSIS — G4730 Sleep apnea, unspecified: Secondary | ICD-10-CM | POA: Diagnosis not present

## 2019-07-15 DIAGNOSIS — F4312 Post-traumatic stress disorder, chronic: Secondary | ICD-10-CM | POA: Diagnosis not present

## 2019-07-15 DIAGNOSIS — Z634 Disappearance and death of family member: Secondary | ICD-10-CM | POA: Diagnosis not present

## 2019-07-15 DIAGNOSIS — F341 Dysthymic disorder: Secondary | ICD-10-CM | POA: Diagnosis not present

## 2019-07-15 DIAGNOSIS — G4709 Other insomnia: Secondary | ICD-10-CM | POA: Diagnosis not present

## 2019-07-15 DIAGNOSIS — F39 Unspecified mood [affective] disorder: Secondary | ICD-10-CM | POA: Diagnosis not present

## 2019-07-24 ENCOUNTER — Other Ambulatory Visit: Payer: Self-pay | Admitting: Internal Medicine

## 2019-07-24 ENCOUNTER — Ambulatory Visit
Admission: RE | Admit: 2019-07-24 | Discharge: 2019-07-24 | Disposition: A | Discharge status: Home / Self Care | Payer: Medicare Other | Source: Ambulatory Visit | Attending: Internal Medicine | Admitting: Internal Medicine

## 2019-07-24 DIAGNOSIS — J181 Lobar pneumonia, unspecified organism: Secondary | ICD-10-CM | POA: Diagnosis not present

## 2019-07-24 DIAGNOSIS — J449 Chronic obstructive pulmonary disease, unspecified: Secondary | ICD-10-CM | POA: Diagnosis not present

## 2019-07-24 DIAGNOSIS — J189 Pneumonia, unspecified organism: Secondary | ICD-10-CM

## 2019-07-24 DIAGNOSIS — R300 Dysuria: Secondary | ICD-10-CM | POA: Diagnosis not present

## 2019-08-01 DIAGNOSIS — E039 Hypothyroidism, unspecified: Secondary | ICD-10-CM | POA: Diagnosis not present

## 2019-08-01 DIAGNOSIS — F329 Major depressive disorder, single episode, unspecified: Secondary | ICD-10-CM | POA: Diagnosis not present

## 2019-08-01 DIAGNOSIS — G459 Transient cerebral ischemic attack, unspecified: Secondary | ICD-10-CM | POA: Diagnosis not present

## 2019-08-01 DIAGNOSIS — I10 Essential (primary) hypertension: Secondary | ICD-10-CM | POA: Diagnosis not present

## 2019-08-01 DIAGNOSIS — E782 Mixed hyperlipidemia: Secondary | ICD-10-CM | POA: Diagnosis not present

## 2019-08-01 DIAGNOSIS — J449 Chronic obstructive pulmonary disease, unspecified: Secondary | ICD-10-CM | POA: Diagnosis not present

## 2019-08-01 DIAGNOSIS — D500 Iron deficiency anemia secondary to blood loss (chronic): Secondary | ICD-10-CM | POA: Diagnosis not present

## 2019-08-08 NOTE — Addendum Note (Signed)
Addended by: Harrington Challenger on: 08/08/2019 01:30 PM   Modules accepted: Orders

## 2019-08-08 NOTE — Telephone Encounter (Signed)
Able to talk to patient. She still using Repatha but not follow up Lipid panel completed in the last 8 months.  Will order Lipid panel to complete ASAP. PA renewal will be done after Lipid panel resulted.

## 2019-08-13 DIAGNOSIS — E785 Hyperlipidemia, unspecified: Secondary | ICD-10-CM | POA: Diagnosis not present

## 2019-08-13 LAB — LIPID PANEL WITH LDL/HDL RATIO
Cholesterol, Total: 205 mg/dL — ABNORMAL HIGH (ref 100–199)
HDL: 61 mg/dL (ref >39)
LDL Calculated: 84 mg/dL (ref 0–99)
LDl/HDL Ratio: 1.4 ratio (ref 0.0–3.2)
Triglycerides: 301 mg/dL — ABNORMAL HIGH (ref 0–149)
VLDL Cholesterol Cal: 60 mg/dL — ABNORMAL HIGH (ref 5–40)

## 2019-08-13 LAB — HEPATIC FUNCTION PANEL
ALT: 19 IU/L (ref 0–32)
AST: 27 IU/L (ref 0–40)
Albumin: 4.4 g/dL (ref 3.8–4.8)
Alkaline Phosphatase: 94 IU/L (ref 39–117)
Bilirubin Total: 0.5 mg/dL (ref 0.0–1.2)
Bilirubin, Direct: 0.16 mg/dL (ref 0.00–0.40)
Total Protein: 6.5 g/dL (ref 6.0–8.5)

## 2019-08-20 DIAGNOSIS — M961 Postlaminectomy syndrome, not elsewhere classified: Secondary | ICD-10-CM | POA: Diagnosis not present

## 2019-08-20 DIAGNOSIS — G039 Meningitis, unspecified: Secondary | ICD-10-CM | POA: Diagnosis not present

## 2019-08-20 DIAGNOSIS — M47817 Spondylosis without myelopathy or radiculopathy, lumbosacral region: Secondary | ICD-10-CM | POA: Diagnosis not present

## 2019-08-20 DIAGNOSIS — G894 Chronic pain syndrome: Secondary | ICD-10-CM | POA: Diagnosis not present

## 2019-08-21 DIAGNOSIS — F4312 Post-traumatic stress disorder, chronic: Secondary | ICD-10-CM | POA: Diagnosis not present

## 2019-08-21 DIAGNOSIS — F341 Dysthymic disorder: Secondary | ICD-10-CM | POA: Diagnosis not present

## 2019-08-21 DIAGNOSIS — G4730 Sleep apnea, unspecified: Secondary | ICD-10-CM | POA: Diagnosis not present

## 2019-08-21 DIAGNOSIS — G4709 Other insomnia: Secondary | ICD-10-CM | POA: Diagnosis not present

## 2019-08-22 DIAGNOSIS — B070 Plantar wart: Secondary | ICD-10-CM | POA: Diagnosis not present

## 2019-08-22 DIAGNOSIS — L570 Actinic keratosis: Secondary | ICD-10-CM | POA: Diagnosis not present

## 2019-08-22 DIAGNOSIS — L218 Other seborrheic dermatitis: Secondary | ICD-10-CM | POA: Diagnosis not present

## 2019-08-22 DIAGNOSIS — X32XXXA Exposure to sunlight, initial encounter: Secondary | ICD-10-CM | POA: Diagnosis not present

## 2019-08-22 DIAGNOSIS — Z1283 Encounter for screening for malignant neoplasm of skin: Secondary | ICD-10-CM | POA: Diagnosis not present

## 2019-08-22 DIAGNOSIS — D225 Melanocytic nevi of trunk: Secondary | ICD-10-CM | POA: Diagnosis not present

## 2019-08-25 ENCOUNTER — Telehealth: Payer: Self-pay | Admitting: Cardiology

## 2019-08-25 NOTE — Telephone Encounter (Signed)
Pt calling requesting a refill on Repatha. Pt would like a call back at [phone]. Please address

## 2019-08-27 NOTE — Telephone Encounter (Signed)
Spoke with patient, PA expired, will send in renewal in morning

## 2019-09-03 ENCOUNTER — Other Ambulatory Visit: Payer: Self-pay | Admitting: Pharmacist

## 2019-09-03 MED ORDER — REPATHA SURECLICK 140 MG/ML ~~LOC~~ SOAJ: 140.0000 mg | SUBCUTANEOUS | 11 refills | Status: DC

## 2019-09-03 NOTE — Telephone Encounter (Signed)
PA renewal approved until 08/2020. Rx sent to [facility]. Confirmed patient already picked up Rx

## 2019-09-10 DIAGNOSIS — I10 Essential (primary) hypertension: Secondary | ICD-10-CM | POA: Diagnosis not present

## 2019-09-10 DIAGNOSIS — D500 Iron deficiency anemia secondary to blood loss (chronic): Secondary | ICD-10-CM | POA: Diagnosis not present

## 2019-09-10 DIAGNOSIS — J449 Chronic obstructive pulmonary disease, unspecified: Secondary | ICD-10-CM | POA: Diagnosis not present

## 2019-09-10 DIAGNOSIS — G459 Transient cerebral ischemic attack, unspecified: Secondary | ICD-10-CM | POA: Diagnosis not present

## 2019-09-10 DIAGNOSIS — F329 Major depressive disorder, single episode, unspecified: Secondary | ICD-10-CM | POA: Diagnosis not present

## 2019-09-10 DIAGNOSIS — J441 Chronic obstructive pulmonary disease with (acute) exacerbation: Secondary | ICD-10-CM | POA: Diagnosis not present

## 2019-09-10 DIAGNOSIS — E782 Mixed hyperlipidemia: Secondary | ICD-10-CM | POA: Diagnosis not present

## 2019-09-10 DIAGNOSIS — E039 Hypothyroidism, unspecified: Secondary | ICD-10-CM | POA: Diagnosis not present

## 2019-09-17 DIAGNOSIS — Z79891 Long term (current) use of opiate analgesic: Secondary | ICD-10-CM | POA: Diagnosis not present

## 2019-09-17 DIAGNOSIS — M961 Postlaminectomy syndrome, not elsewhere classified: Secondary | ICD-10-CM | POA: Diagnosis not present

## 2019-09-17 DIAGNOSIS — G894 Chronic pain syndrome: Secondary | ICD-10-CM | POA: Diagnosis not present

## 2019-09-17 DIAGNOSIS — Z79899 Other long term (current) drug therapy: Secondary | ICD-10-CM | POA: Diagnosis not present

## 2019-09-17 DIAGNOSIS — G039 Meningitis, unspecified: Secondary | ICD-10-CM | POA: Diagnosis not present

## 2019-09-17 DIAGNOSIS — M47817 Spondylosis without myelopathy or radiculopathy, lumbosacral region: Secondary | ICD-10-CM | POA: Diagnosis not present

## 2019-09-29 DIAGNOSIS — D509 Iron deficiency anemia, unspecified: Secondary | ICD-10-CM | POA: Diagnosis not present

## 2019-10-01 DIAGNOSIS — J209 Acute bronchitis, unspecified: Secondary | ICD-10-CM | POA: Diagnosis not present

## 2019-10-01 DIAGNOSIS — J441 Chronic obstructive pulmonary disease with (acute) exacerbation: Secondary | ICD-10-CM | POA: Diagnosis not present

## 2019-10-08 DIAGNOSIS — Z23 Encounter for immunization: Secondary | ICD-10-CM | POA: Diagnosis not present

## 2019-10-10 DIAGNOSIS — H04129 Dry eye syndrome of unspecified lacrimal gland: Secondary | ICD-10-CM | POA: Diagnosis not present

## 2019-10-10 DIAGNOSIS — H02832 Dermatochalasis of right lower eyelid: Secondary | ICD-10-CM | POA: Diagnosis not present

## 2019-10-10 DIAGNOSIS — H02835 Dermatochalasis of left lower eyelid: Secondary | ICD-10-CM | POA: Diagnosis not present

## 2019-10-10 DIAGNOSIS — H02831 Dermatochalasis of right upper eyelid: Secondary | ICD-10-CM | POA: Diagnosis not present

## 2019-10-10 DIAGNOSIS — H02834 Dermatochalasis of left upper eyelid: Secondary | ICD-10-CM | POA: Diagnosis not present

## 2019-10-13 DIAGNOSIS — Z79891 Long term (current) use of opiate analgesic: Secondary | ICD-10-CM | POA: Diagnosis not present

## 2019-10-13 DIAGNOSIS — M961 Postlaminectomy syndrome, not elsewhere classified: Secondary | ICD-10-CM | POA: Diagnosis not present

## 2019-10-13 DIAGNOSIS — M47817 Spondylosis without myelopathy or radiculopathy, lumbosacral region: Secondary | ICD-10-CM | POA: Diagnosis not present

## 2019-10-13 DIAGNOSIS — G039 Meningitis, unspecified: Secondary | ICD-10-CM | POA: Diagnosis not present

## 2019-10-13 DIAGNOSIS — G894 Chronic pain syndrome: Secondary | ICD-10-CM | POA: Diagnosis not present

## 2019-10-13 DIAGNOSIS — Z79899 Other long term (current) drug therapy: Secondary | ICD-10-CM | POA: Diagnosis not present

## 2019-10-16 ENCOUNTER — Other Ambulatory Visit: Payer: Self-pay | Admitting: Internal Medicine

## 2019-10-16 DIAGNOSIS — E782 Mixed hyperlipidemia: Secondary | ICD-10-CM | POA: Diagnosis not present

## 2019-10-16 DIAGNOSIS — Z0000 Encounter for general adult medical examination without abnormal findings: Secondary | ICD-10-CM | POA: Diagnosis not present

## 2019-10-16 DIAGNOSIS — F329 Major depressive disorder, single episode, unspecified: Secondary | ICD-10-CM | POA: Diagnosis not present

## 2019-10-16 DIAGNOSIS — Z23 Encounter for immunization: Secondary | ICD-10-CM | POA: Diagnosis not present

## 2019-10-16 DIAGNOSIS — Z1231 Encounter for screening mammogram for malignant neoplasm of breast: Secondary | ICD-10-CM

## 2019-10-16 DIAGNOSIS — I10 Essential (primary) hypertension: Secondary | ICD-10-CM | POA: Diagnosis not present

## 2019-10-16 DIAGNOSIS — J449 Chronic obstructive pulmonary disease, unspecified: Secondary | ICD-10-CM | POA: Diagnosis not present

## 2019-10-16 DIAGNOSIS — Z1389 Encounter for screening for other disorder: Secondary | ICD-10-CM | POA: Diagnosis not present

## 2019-11-04 DIAGNOSIS — Z411 Encounter for cosmetic surgery: Secondary | ICD-10-CM | POA: Insufficient documentation

## 2019-11-06 DIAGNOSIS — F341 Dysthymic disorder: Secondary | ICD-10-CM | POA: Diagnosis not present

## 2019-11-06 DIAGNOSIS — G4709 Other insomnia: Secondary | ICD-10-CM | POA: Diagnosis not present

## 2019-11-06 DIAGNOSIS — G4730 Sleep apnea, unspecified: Secondary | ICD-10-CM | POA: Diagnosis not present

## 2019-11-06 DIAGNOSIS — F39 Unspecified mood [affective] disorder: Secondary | ICD-10-CM | POA: Diagnosis not present

## 2019-11-06 DIAGNOSIS — F4312 Post-traumatic stress disorder, chronic: Secondary | ICD-10-CM | POA: Diagnosis not present

## 2019-11-11 DIAGNOSIS — M961 Postlaminectomy syndrome, not elsewhere classified: Secondary | ICD-10-CM | POA: Diagnosis not present

## 2019-11-11 DIAGNOSIS — Z79891 Long term (current) use of opiate analgesic: Secondary | ICD-10-CM | POA: Diagnosis not present

## 2019-11-11 DIAGNOSIS — M5137 Other intervertebral disc degeneration, lumbosacral region: Secondary | ICD-10-CM | POA: Diagnosis not present

## 2019-11-11 DIAGNOSIS — M79606 Pain in leg, unspecified: Secondary | ICD-10-CM | POA: Diagnosis not present

## 2019-11-11 DIAGNOSIS — G894 Chronic pain syndrome: Secondary | ICD-10-CM | POA: Diagnosis not present

## 2019-11-11 DIAGNOSIS — Z79899 Other long term (current) drug therapy: Secondary | ICD-10-CM | POA: Diagnosis not present

## 2019-12-05 ENCOUNTER — Ambulatory Visit
Admission: RE | Admit: 2019-12-05 | Discharge: 2019-12-05 | Disposition: A | Discharge status: Home / Self Care | Payer: Medicare Other | Source: Ambulatory Visit | Attending: Internal Medicine | Admitting: Internal Medicine

## 2019-12-05 ENCOUNTER — Other Ambulatory Visit: Payer: Self-pay

## 2019-12-05 DIAGNOSIS — Z1231 Encounter for screening mammogram for malignant neoplasm of breast: Secondary | ICD-10-CM

## 2019-12-08 ENCOUNTER — Telehealth: Payer: Self-pay

## 2019-12-08 NOTE — Telephone Encounter (Signed)
Pt called complaining of pain in her foot. Advised her since it has been 2 years since her last ov that she would need to call her pcp and be referred again as a new patient.   She states that she has a call into them and will wait to hear back.   Brenda Carney [name]

## 2019-12-09 DIAGNOSIS — M79604 Pain in right leg: Secondary | ICD-10-CM | POA: Diagnosis not present

## 2019-12-09 DIAGNOSIS — M5137 Other intervertebral disc degeneration, lumbosacral region: Secondary | ICD-10-CM | POA: Diagnosis not present

## 2019-12-09 DIAGNOSIS — M961 Postlaminectomy syndrome, not elsewhere classified: Secondary | ICD-10-CM | POA: Diagnosis not present

## 2019-12-09 DIAGNOSIS — G894 Chronic pain syndrome: Secondary | ICD-10-CM | POA: Diagnosis not present

## 2019-12-09 DIAGNOSIS — M79606 Pain in leg, unspecified: Secondary | ICD-10-CM | POA: Diagnosis not present

## 2019-12-10 ENCOUNTER — Ambulatory Visit (HOSPITAL_COMMUNITY)
Admission: RE | Admit: 2019-12-10 | Discharge: 2019-12-10 | Disposition: A | Discharge status: Home / Self Care | Payer: Medicare Other | Source: Ambulatory Visit | Attending: Internal Medicine | Admitting: Internal Medicine

## 2019-12-10 ENCOUNTER — Other Ambulatory Visit: Payer: Self-pay

## 2019-12-10 ENCOUNTER — Other Ambulatory Visit (HOSPITAL_COMMUNITY): Payer: Self-pay | Admitting: Internal Medicine

## 2019-12-10 DIAGNOSIS — R52 Pain, unspecified: Secondary | ICD-10-CM | POA: Insufficient documentation

## 2019-12-10 NOTE — Progress Notes (Signed)
Right lower extremity venous duplex has been completed. Preliminary results can be found in CV Proc through chart review.  Results were given to [facility] at Dr. Quentin Cornwall office.  12/10/19 1:23 PM Brenda Carney RVT

## 2019-12-22 ENCOUNTER — Ambulatory Visit
Admission: RE | Admit: 2019-12-22 | Discharge: 2019-12-22 | Disposition: A | Discharge status: Home / Self Care | Payer: Medicare Other | Source: Ambulatory Visit | Attending: Internal Medicine | Admitting: Internal Medicine

## 2019-12-22 ENCOUNTER — Other Ambulatory Visit: Payer: Self-pay | Admitting: Internal Medicine

## 2019-12-22 DIAGNOSIS — R1031 Right lower quadrant pain: Secondary | ICD-10-CM

## 2019-12-31 ENCOUNTER — Ambulatory Visit: Payer: Medicare Other | Admitting: Orthopaedic Surgery

## 2019-12-31 DIAGNOSIS — Z1331 Encounter for screening for depression: Secondary | ICD-10-CM | POA: Diagnosis not present

## 2019-12-31 DIAGNOSIS — Z8711 Personal history of peptic ulcer disease: Secondary | ICD-10-CM | POA: Diagnosis not present

## 2019-12-31 DIAGNOSIS — M25551 Pain in right hip: Secondary | ICD-10-CM | POA: Diagnosis not present

## 2019-12-31 DIAGNOSIS — I739 Peripheral vascular disease, unspecified: Secondary | ICD-10-CM | POA: Diagnosis not present

## 2019-12-31 DIAGNOSIS — G894 Chronic pain syndrome: Secondary | ICD-10-CM | POA: Diagnosis not present

## 2019-12-31 DIAGNOSIS — I10 Essential (primary) hypertension: Secondary | ICD-10-CM | POA: Diagnosis not present

## 2019-12-31 DIAGNOSIS — F331 Major depressive disorder, recurrent, moderate: Secondary | ICD-10-CM | POA: Diagnosis not present

## 2020-01-01 ENCOUNTER — Encounter: Payer: Self-pay | Admitting: Orthopaedic Surgery

## 2020-01-01 ENCOUNTER — Ambulatory Visit (INDEPENDENT_AMBULATORY_CARE_PROVIDER_SITE_OTHER): Payer: Medicare Other | Admitting: Orthopaedic Surgery

## 2020-01-01 ENCOUNTER — Other Ambulatory Visit: Payer: Self-pay

## 2020-01-01 VITALS — Ht 65.0 in | Wt 153.0 lb

## 2020-01-01 DIAGNOSIS — M25551 Pain in right hip: Secondary | ICD-10-CM

## 2020-01-01 DIAGNOSIS — S72001A Fracture of unspecified part of neck of right femur, initial encounter for closed fracture: Secondary | ICD-10-CM

## 2020-01-01 NOTE — Progress Notes (Signed)
Office Visit Note   Patient: Brenda Carney           Date of Birth: 1951-09-08           MRN: [id number] Visit Date: 01/01/2020              Requested by: Leeroy Cha, MD [address] [place],  [place] [address] PCP: Leeroy Cha, MD   Assessment & Plan: Visit Diagnoses:  1. Pain in right hip     Plan: Due to the acute nature of her right hip pain and her osteopenic bone I am concerned of an occult fracture.  I want her to stay off of her hip and offloaded using a cane in the opposite side.  I believe this warrants a more urgent MRI of her right hip to rule out a fracture or AVN.  We will work on getting this set up.  All question concerns were answered addressed.  She agrees with proceeding with this as well.  Follow-Up Instructions: No follow-ups on file.  After MRI of right hip  Orders:  No orders of the defined types were placed in this encounter.  No orders of the defined types were placed in this encounter.     Procedures: No procedures performed   Clinical Data: No additional findings.   Subjective: Chief Complaint  Patient presents with  . Right Hip - Pain  The patient is a very pleasant 69 year old female sent from Dr. Inda Merlin to evaluate and treat acute right hip pain that came on all of a sudden.  She denies any specific injury.  She is now having to walk with a cane and has severe pain in her groin.  She walks with a limp as well.  She does have a history of COPD and osteopenia.  She had a history of back surgery as well.  She denies any long-term treatment with steroids.  She has never injured this hip before or had surgery on it.  Her left hip does not hurt and her right knee does not hurt.  HPI  Review of Systems She currently denies any headache, chest pain, shortness of breath, fever, chills, nausea, vomiting  Objective: Vital Signs: Ht 5\' 5"  (1.651 m)   Wt 153 lb (69.4 kg)   BMI 25.46 kg/m   Physical Exam She is  alert and orient x3 and in no acute distress Ortho Exam Examination of her right hip shows severe pain with internal and external rotation of the hip.  There is no blocks to rotation but is very painful.  Her left hip exam is benign.  Both her knees are normal on exam. Specialty Comments:  No specialty comments available.  Imaging: No results found. 2 views of the right hip that are independently reviewed that accompany her show no acute findings.  I am concerned there is some irregularity in the femoral head suggesting avascular necrosis.  PMFS History: Patient Active Problem List   Diagnosis Date Noted  . CAP (community acquired pneumonia) 06/17/2019  . Leukocytosis 06/17/2019  . Hypoxia   . Sepsis ([place]) 04/29/2018  . Hematemesis 04/29/2018  . Hypokalemia 04/29/2018  . AKI (acute kidney injury) ([place]) 04/29/2018  . Acute respiratory failure with hypoxia ([place]) 04/29/2018  . Thoracic aortic ectasia ([place])   . Sleep apnea   . Pneumonia   . Migraine headache   . Lumbosacral spondylosis   . Hx of multiple concussions   . History of kidney stones   .  History of IBS   . GERD (gastroesophageal reflux disease)   . Exercise-induced asthma with acute exacerbation   . Depression   . COPD with acute exacerbation ([place])   . Chronic lower back pain   . Cervical spondylosis   . Cerebral aneurysm   . Arthritis   . Anemia   . Coronary artery disease, non-occlusive 06/28/2017  . Critical lower limb ischemia 05/14/2017  . Preoperative cardiovascular examination   . PAD (peripheral artery disease) ([place])   . S/P lumbar spinal fusion 09/30/2014  . Essential hypertension   . Hyperlipidemia with target LDL less than 70   . Cerebrovascular disease   . Headache(784.0) 10/07/2013  . TIA (transient ischemic attack) 09/05/2013  . Tobacco abuse 09/05/2013   Past Medical History:  Diagnosis Date  . Anemia    as a younger woman  . Arthritis    "thumbs; hips" (05/14/2017)  . 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 Cerebrovascular disease    Carotid dopplers which showed no significant increase in velocities, extremely minimal on the left, antegrade vertebral bilaterally.  . Cervical spondylosis   . Chronic lower back pain   . COPD (chronic obstructive pulmonary disease) ([place])    "little bit" (05/14/2017)  . Coronary artery disease, non-occlusive    Coronary calcium scoring: June 2018: Score 1106. 98 percentile for age -[place]  . Depression   . Exercise-induced asthma with acute exacerbation    "only in very hot weather" (05/14/2017)  . GERD (gastroesophageal reflux disease)   . [id number])    "monthly" (05/14/2017)  . History of IBS    resovlved  . History of kidney stones   . 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 Hyperlipidemia with target LDL less than 70    Mostly statin intolerant,. Currently on Livalo 4 mg  . Hypertension, benign   . Lumbosacral spondylosis   . Migraine headache    "were monthly; none recently" (05/14/2017)  . PAD (peripheral artery disease) (H[facility])    Status post right above-the-knee-below the knee popliteal artery bypass; postop right ABI 0.96.  Marland Kitchen Pneumonia "several times"  . Sleep apnea    does not wear CPAP; "think it was medication related" (05/14/2017)  . Thoracic aortic ectasia (H[facility])    ~40 mm on Coronary Calcium Score CT - recommend f/u CTA or MRA  . TIA (transient ischemic attack) 09/2011   "several"    Family History  Problem Relation Age of Onset  . Stroke Father   . Heart attack Mother   . Stroke Mother     Past Surgical History:  Procedure Laterality Date  . ABDOMINAL AORTOGRAM W/LOWER EXTREMITY N/A 05/14/2017   Procedure: Abdominal Aortogram w/Lower Extremity;  Surgeon: Conrad [place], MD;  Location: [facility];  Service: Cardiovascular;  Laterality: N/A;  . ACROMIO-CLAVICULAR JOINT REPAIR Left 1990s   "I think it was  called an AC joint removal"  . BACK SURGERY    . BYPASS GRAFT POPLITEAL TO POPLITEAL Right 05/15/2017   Procedure: BYPASS GRAFT ABOVE KNEE POPLITEAL TO BELOW KNEE POPLITEAL ARTERY;  Surgeon: Conrad Asbury, MD;  Location: [facility];  Service: Vascular;  Laterality: Right;  . ESOPHAGOGASTRODUODENOSCOPY (EGD) WITH PROPOFOL N/A 04/29/2018   Procedure: ESOPHAGOGASTRODUODENOSCOPY (EGD) WITH PROPOFOL;  Surgeon: Otis Brace, MD;  Location: [facility];  Service: Gastroenterology;  Laterality: N/A;  . FOOT FRACTURE SURGERY Bilateral  multiple procedures  . FRACTURE SURGERY    . MAXIMUM ACCESS (MAS)POSTERIOR LUMBAR INTERBODY FUSION (PLIF) 1 LEVEL N/A 09/30/2014   Procedure: FOR MAXIMUM ACCESS (MAS) POSTERIOR LUMBAR INTERBODY FUSION (PLIF) 1 LEVEL;  Surgeon: Eustace Moore, MD;  Location: [facility];  Service: Neurosurgery;  Laterality: N/A;  FOR MAXIMUM ACCESS (MAS) POSTERIOR LUMBAR INTERBODY FUSION (PLIF) 1 LEVEL LUMBAR 4-5  . MULTIPLE TOOTH EXTRACTIONS Right 04/2017  . NM MYOVIEW LTD  06/2017    Normal EF, 66%. No ST changes. Normal study. LOW RISK.  Marland Kitchen TRANSTHORACIC ECHOCARDIOGRAM  09/07/2013   Done to evaluate TIA: Normal LV size and function. EF 55-60%. GR 1 DD. Mild left atrial dilation. Mildly calcified mitral leaflets. Otherwise normal.   Social History   Occupational History  . Occupation: Horse Breeder   Tobacco Use  . Smoking status: Light Tobacco Smoker    Packs/day: 0.02    Years: 36.00    Pack years: 0.72    Types: Cigarettes    Last attempt to quit: 04/24/2017    Years since quitting: 2.6  . Smokeless tobacco: Never Used  Substance and Sexual Activity  . Alcohol use: Yes    Alcohol/week: 3.0 standard drinks    Types: 3 Glasses of wine per week  . Drug use: No  . Sexual activity: Never

## 2020-01-02 ENCOUNTER — Ambulatory Visit: Payer: Medicare Other | Admitting: Cardiology

## 2020-01-03 ENCOUNTER — Ambulatory Visit (HOSPITAL_BASED_OUTPATIENT_CLINIC_OR_DEPARTMENT_OTHER)
Admission: RE | Admit: 2020-01-03 | Discharge: 2020-01-03 | Disposition: A | Discharge status: Home / Self Care | Payer: Medicare Other | Source: Ambulatory Visit | Attending: Orthopaedic Surgery | Admitting: Orthopaedic Surgery

## 2020-01-03 ENCOUNTER — Other Ambulatory Visit: Payer: Self-pay

## 2020-01-03 DIAGNOSIS — S72001A Fracture of unspecified part of neck of right femur, initial encounter for closed fracture: Secondary | ICD-10-CM | POA: Insufficient documentation

## 2020-01-06 ENCOUNTER — Other Ambulatory Visit: Payer: Self-pay

## 2020-01-06 ENCOUNTER — Ambulatory Visit (INDEPENDENT_AMBULATORY_CARE_PROVIDER_SITE_OTHER): Payer: Medicare Other | Admitting: Orthopaedic Surgery

## 2020-01-06 ENCOUNTER — Encounter: Payer: Self-pay | Admitting: Orthopaedic Surgery

## 2020-01-06 ENCOUNTER — Other Ambulatory Visit: Payer: Self-pay | Admitting: Physician Assistant

## 2020-01-06 DIAGNOSIS — M87051 Idiopathic aseptic necrosis of right femur: Secondary | ICD-10-CM | POA: Insufficient documentation

## 2020-01-06 NOTE — Progress Notes (Signed)
Office Visit Note   Patient: Brenda Carney           Date of Birth: 1951/12/25           MRN: [id number] Visit Date: 01/06/2020              Requested by: Leeroy Cha, MD [address] [place],  [place] [address] PCP: Leeroy Cha, MD   Assessment & Plan: Visit Diagnoses:  1. Avascular necrosis of bone of right hip ([place])     Plan: I did explain in detail to her what her diagnosis is with avascular necrosis.  I showed her hip model and went over her MRI.  I have recommended a more urgent total hip arthroplasty given her femoral head collapse.  She will just use her cane for now and try to minimize her activities with her right hip.  I went over hip replacement surgery in detail showing her hip model.  I gave her handout about as well.  We talked about the risk and benefits of surgery.  We talked about what to expect with her interoperative and postoperative course.  All questions and concerns were answered and addressed as appropriate as possible.  We will work on getting her set up for surgery in the next week.  Follow-Up Instructions: Return for 2 weeks post-op.   Orders:  No orders of the defined types were placed in this encounter.  No orders of the defined types were placed in this encounter.     Procedures: No procedures performed   Clinical Data: No additional findings.   Subjective: Chief Complaint  Patient presents with  . Right Hip - Follow-up  The patient comes in today to go over an MRI of her right hip.  She had acute flareup of pain of her right hip and difficulty with ambulating.  I was concerned on plain films about the possibility of avascular necrosis.  Given her clinical exam and her pain I feel that it was urgent to obtain an MRI.  She does report pain in both hips with the right hip more severe than left.  Her MRI does show severe avascular necrosis with femoral head collapse on the right side.  I am recommending a more  urgent total hip replacement.  I talked to her in length about avascular necrosis.  It is of uncertain etiology based on talking with her.  Her biggest comorbidity is COPD with asthma.  HPI  Review of Systems She reports severe right hip pain and moderate left hip pain.  She denies any headache, chest pain, shortness of breath, fever, chills, nausea, vomiting  Objective: Vital Signs: There were no vitals taken for this visit.  Physical Exam She is alert and oriented in no acute distress but obvious discomfort she is ambulate with a cane. Ortho Exam Examination of her right hip shows severe pain with attempts of internal and external rotation.  There is moderate pain with her left hip with rotation. Specialty Comments:  No specialty comments available.  Imaging: No results found. The MRI is reviewed with her of her right hip.  It shows severe avascular necrosis with edema in the femoral head and neck.  There is evidence of femoral head collapse.  There is a large joint effusion as well.  There is evidence of avascular crest of the left hip as well.  PMFS History: Patient Active Problem List   Diagnosis Date Noted  . Avascular necrosis of bone of right  hip ([place]) 01/06/2020  . CAP (community acquired pneumonia) 06/17/2019  . Leukocytosis 06/17/2019  . Hypoxia   . Sepsis ([place]) 04/29/2018  . Hematemesis 04/29/2018  . Hypokalemia 04/29/2018  . AKI (acute kidney injury) ([place]) 04/29/2018  . Acute respiratory failure with hypoxia ([place]) 04/29/2018  . Thoracic aortic ectasia ([place])   . Sleep apnea   . Pneumonia   . Migraine headache   . Lumbosacral spondylosis   . Hx of multiple concussions   . History of kidney stones   . History of IBS   . GERD (gastroesophageal reflux disease)   . Exercise-induced asthma with acute exacerbation   . Depression   . COPD with acute exacerbation ([place])   . Chronic lower back pain   . Cervical spondylosis   . Cerebral aneurysm   . Arthritis   .  Anemia   . Coronary artery disease, non-occlusive 06/28/2017  . Critical lower limb ischemia 05/14/2017  . Preoperative cardiovascular examination   . PAD (peripheral artery disease) ([place])   . S/P lumbar spinal fusion 09/30/2014  . Essential hypertension   . Hyperlipidemia with target LDL less than 70   . Cerebrovascular disease   . Headache(784.0) 10/07/2013  . TIA (transient ischemic attack) 09/05/2013  . Tobacco abuse 09/05/2013   Past Medical History:  Diagnosis Date  . Anemia    as a younger woman  . Arthritis    "thumbs; hips" (05/14/2017)  . 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 Cerebrovascular disease    Carotid dopplers which showed no significant increase in velocities, extremely minimal on the left, antegrade vertebral bilaterally.  . Cervical spondylosis   . Chronic lower back pain   . COPD (chronic obstructive pulmonary disease) ([place])    "little bit" (05/14/2017)  . Coronary artery disease, non-occlusive    Coronary calcium scoring: June 2018: Score 1106. 98 percentile for age -[place]  . Depression   . Exercise-induced asthma with acute exacerbation    "only in very hot weather" (05/14/2017)  . GERD (gastroesophageal reflux disease)   . [id number])    "monthly" (05/14/2017)  . History of IBS    resovlved  . History of kidney stones   . 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 Hyperlipidemia with target LDL less than 70    Mostly statin intolerant,. Currently on Livalo 4 mg  . Hypertension, benign   . Lumbosacral spondylosis   . Migraine headache    "were monthly; none recently" (05/14/2017)  . PAD (peripheral artery disease) (HCC)    Status post right above-the-knee-below the knee popliteal artery bypass; postop right ABI 0.96.  Marland Kitchen Pneumonia "several times"  . Sleep apnea    does not wear CPAP; "think it was medication related" (05/14/2017)  . Thoracic  aortic ectasia (HCC)    ~40 mm on Coronary Calcium Score CT - recommend f/u CTA or MRA  . TIA (transient ischemic attack) 09/2011   "several"    Family History  Problem Relation Age of Onset  . Stroke Father   . Heart attack Mother   . Stroke Mother     Past Surgical History:  Procedure Laterality Date  . ABDOMINAL AORTOGRAM W/LOWER EXTREMITY N/A 05/14/2017   Procedure: Abdominal Aortogram w/Lower Extremity;  Surgeon: Conrad Tomball, MD;  Location: [facility];  Service: Cardiovascular;  Laterality: N/A;  . ACROMIO-CLAVICULAR JOINT REPAIR Left 1990s   "  I think it was called an [facility] joint removal"  . BACK SURGERY    . BYPASS GRAFT POPLITEAL TO POPLITEAL Right 05/15/2017   Procedure: BYPASS GRAFT ABOVE KNEE POPLITEAL TO BELOW KNEE POPLITEAL ARTERY;  Surgeon: Conrad Davenport, MD;  [facility];  Service: Vascular;  Laterality: Right;  . ESOPHAGOGASTRODUODENOSCOPY (EGD) WITH PROPOFOL N/A 04/29/2018   Procedure: ESOPHAGOGASTRODUODENOSCOPY (EGD) WITH PROPOFOL;  Surgeon: Otis Brace, MD;  Location: [facility];  Service: Gastroenterology;  Laterality: N/A;  . FOOT FRACTURE SURGERY Bilateral    multiple procedures  . FRACTURE SURGERY    . MAXIMUM ACCESS (MAS)POSTERIOR LUMBAR INTERBODY FUSION (PLIF) 1 LEVEL N/A 09/30/2014   Procedure: FOR MAXIMUM ACCESS (MAS) POSTERIOR LUMBAR INTERBODY FUSION (PLIF) 1 LEVEL;  Surgeon: Eustace Moore, MD;  Location: [facility];  Service: Neurosurgery;  Laterality: N/A;  FOR MAXIMUM ACCESS (MAS) POSTERIOR LUMBAR INTERBODY FUSION (PLIF) 1 LEVEL LUMBAR 4-5  . MULTIPLE TOOTH EXTRACTIONS Right 04/2017  . NM MYOVIEW LTD  06/2017    Normal EF, 66%. No ST changes. Normal study. LOW RISK.  Marland Kitchen TRANSTHORACIC ECHOCARDIOGRAM  09/07/2013   Done to evaluate TIA: Normal LV size and function. EF 55-60%. GR 1 DD. Mild left atrial dilation. Mildly calcified mitral leaflets. Otherwise normal.   Social History   Occupational History  . Occupation: Horse Breeder     Tobacco Use  . Smoking status: Light Tobacco Smoker    Packs/day: 0.02    Years: 36.00    Pack years: 0.72    Types: Cigarettes    Last attempt to quit: 04/24/2017    Years since quitting: 2.7  . Smokeless tobacco: Never Used  Substance and Sexual Activity  . Alcohol use: Yes    Alcohol/week: 3.0 standard drinks    Types: 3 Glasses of wine per week  . Drug use: No  . Sexual activity: Never

## 2020-01-07 ENCOUNTER — Telehealth: Payer: Self-pay | Admitting: *Deleted

## 2020-01-07 NOTE — Telephone Encounter (Signed)
   [facility] Pre-operative Risk Assessment    Request for surgical clearance:  1. What type of surgery is being performed? RIGHT TOTAL HIP ARTHROPLASTY   2. When is this surgery scheduled? 01/16/20   3. What type of clearance is required (medical clearance vs. Pharmacy clearance to hold med vs. Both)? MEDICAL  4. Are there any medications that need to be held prior to surgery and how long? NONE LISTED   5. Practice name and name of physician performing surgery? [facility]; DR. Jean Rosenthal   6. What is your office phone number [phone]    7.   What is your office fax number [phone] ATTN: SHERRI  8.   Anesthesia type (None, local, MAC, general) ? SPINAL   Julaine Hua 01/07/2020, 9:18 AM  _________________________________________________________________   (provider comments below)

## 2020-01-07 NOTE — Telephone Encounter (Signed)
Patient has appointment with Dr. Ellyn Hack tomorrow at 4:00pm so pre-op assessment can be addressed at that time. Will route request form to Dr. Ellyn Hack so that he is aware.

## 2020-01-08 ENCOUNTER — Ambulatory Visit (INDEPENDENT_AMBULATORY_CARE_PROVIDER_SITE_OTHER): Payer: Medicare Other | Admitting: Cardiology

## 2020-01-08 ENCOUNTER — Encounter: Payer: Self-pay | Admitting: Cardiology

## 2020-01-08 ENCOUNTER — Other Ambulatory Visit: Payer: Self-pay

## 2020-01-08 VITALS — BP 119/88 | HR 92 | Temp 97.3°F | Ht 65.0 in | Wt 152.6 lb

## 2020-01-08 DIAGNOSIS — I2510 Atherosclerotic heart disease of native coronary artery without angina pectoris: Secondary | ICD-10-CM

## 2020-01-08 DIAGNOSIS — Z01810 Encounter for preprocedural cardiovascular examination: Secondary | ICD-10-CM

## 2020-01-08 DIAGNOSIS — I77810 Thoracic aortic ectasia: Secondary | ICD-10-CM | POA: Diagnosis not present

## 2020-01-08 DIAGNOSIS — E785 Hyperlipidemia, unspecified: Secondary | ICD-10-CM | POA: Diagnosis not present

## 2020-01-08 DIAGNOSIS — I679 Cerebrovascular disease, unspecified: Secondary | ICD-10-CM

## 2020-01-08 DIAGNOSIS — I10 Essential (primary) hypertension: Secondary | ICD-10-CM | POA: Diagnosis not present

## 2020-01-08 NOTE — Assessment & Plan Note (Signed)
History of TIAs.  No recurrent symptoms.  She probably should be on daily aspirin which she is not taking.  It would be okay to hold that for surgery. This does give her 1 preoperative risk point.  Despite that she is on aggressive medical management with blood pressure & lipid control along with smoking cessation.

## 2020-01-08 NOTE — Patient Instructions (Signed)
Medication Instructions:   NO CHANGES *If you need a refill on your cardiac medications before your next appointment, please call your pharmacy*  Lab Work: NOT NEEDED  Testing/Procedures: NOT NEEDED Follow-Up: At [facility], you and your health needs are our priority.  As part of our continuing mission to provide you with exceptional heart care, we have created designated Provider Care Teams.  These Care Teams include your primary Cardiologist (physician) and Advanced Practice Providers (APPs -  Physician Assistants and Nurse Practitioners) who all work together to provide you with the care you need, when you need it.  Your next appointment:   12 month(s)  The format for your next appointment:   In Person  Provider:   Glenetta Hew, MD  Other Instructions CLEARANCE FOR SURGERY , MAAY HOLD ASPIRIN FOR SURGERY IF NEEDED

## 2020-01-08 NOTE — Assessment & Plan Note (Signed)
Can discuss follow-up imaging next year.

## 2020-01-08 NOTE — Assessment & Plan Note (Signed)
She is no longer on Livalo.  She is on Repatha.  Her LDL had been 45, most recent check was little higher.  At this point I think we will just simply continue with Repatha which needs to be refilled.  If lipid levels do go up on follow-up checks, may want to consider an additional agent such as Nexletol.

## 2020-01-08 NOTE — Progress Notes (Signed)
Primary Care Provider: Leeroy Cha, MD Cardiologist: Glenetta Hew, MD Orthopedic Surgeon: Margaretha Sheffield, MD  Clinic Note: Chief Complaint  Patient presents with  . Follow-up    No complaints from cardiac standpoint  . Pre-op Exam    Staged bilateral hip surgery    HPI:    Brenda Carney is a 69 y.o. female with significant CORONARY ARTERY CALCIFICATION noted on CORONARY CTA (CORONARY CALCIUM SCORE > 1000, nonischemic nuclear stress test), and significant hyperlipidemia now on Repatha who is being seen today for DELAYED FOLLOW-UP AND PREOPERATIVE CARDIOVASCULAR EVALUATION at the request of Varadarajan, Rupashree,*and Dr. Jean Rosenthal.  Brenda Carney was last seen on January 21, 2018.  She was doing very well from cardiac standpoint.  Excited about her lipid results.  She had almost fully quit smoking. (She subsequently did quit later on that year).  Recent Hospitalizations: None  Reviewed  CV studies:    The following studies were reviewed today: (if available, images/films reviewed: From Epic Chart or Care Everywhere) . None:   Interval History:   Brenda Carney returns here today stating that she is doing very well Brenda Carney standpoint.  The only issue is that she is now is essentially immobilized by hip pain.  It is tender screeching halt and she is barely able to walk without excruciating pain.  This is been for the last month or 2, prior to that she was able to get around and do what she wanted to do albeit limited by hip pain.  She had not had any chest pain or pressure with rest or exertion.  No exertional dyspnea.  Easily able to do more than 6-8 METS.  She has not had any heart failure or angina symptoms. Although she had a history of TIA, she is not had any further TIA or amaurosis fugax symptoms.   CV Review of Symptoms (Summary): no chest pain or dyspnea on exertion negative for - edema, irregular heartbeat, orthopnea, palpitations,  paroxysmal nocturnal dyspnea, rapid heart rate, shortness of breath or Syncope/near syncope, TIA/amaurosis fugax, claudication.  Rapid irregular heartbeats. For the last couple months she has been extremely limited to start activity based on her  The patient does not have symptoms concerning for COVID-19 infection (fever, chills, cough, or new shortness of breath).  The patient is practicing social distancing & masking when out - esp groceries (infrequent)   REVIEWED OF SYSTEMS   A comprehensive ROS was performed. Review of Systems  Constitutional: Negative for malaise/fatigue and weight loss.  HENT: Negative for congestion and nosebleeds.   Respiratory: Positive for cough (A little bit, but less and less the father she gets from smoking.). Negative for shortness of breath and wheezing.   Gastrointestinal: Negative for blood in stool, diarrhea, heartburn and melena.  Genitourinary: Negative for hematuria.  Musculoskeletal: Positive for joint pain (Significant bilateral hip pain now immobilizing.).  Neurological: Negative for dizziness, focal weakness (More limited by pain than weakness.) and weakness.  Psychiatric/Behavioral: Negative for depression and memory loss. The patient does not have insomnia.    I have reviewed and (if needed) personally updated the patient's problem list, medications, allergies, past medical and surgical history, social and family history.   PAST MEDICAL HISTORY   Past Medical History:  Diagnosis Date  . Anemia    as a younger woman  . Arthritis    "thumbs; hips" (05/14/2017)  . 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  Cerebrovascular disease    Carotid dopplers which showed no significant increase in velocities, extremely minimal on the left, antegrade vertebral bilaterally.  . Cervical spondylosis   . Chronic lower back pain   . COPD (chronic obstructive pulmonary disease) ([place])    "little  bit" (05/14/2017)  . Coronary artery disease, non-occlusive    Coronary calcium scoring: June 2018: Score 1106. 98 percentile for age -[place]  . Depression   . Exercise-induced asthma with acute exacerbation    "only in very hot weather" (05/14/2017)  . GERD (gastroesophageal reflux disease)   . [id number])    "monthly" (05/14/2017)  . History of IBS    resovlved  . History of kidney stones   . 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 Hyperlipidemia with target LDL less than 70    Mostly statin intolerant,. Currently on Livalo 4 mg  . Hypertension, benign   . Lumbosacral spondylosis   . Migraine headache    "were monthly; none recently" (05/14/2017)  . PAD (peripheral artery disease) (HCC)    Status post right above-the-knee-below the knee popliteal artery bypass; postop right ABI 0.96.  Marland Kitchen Pneumonia "several times"  . Sleep apnea    does not wear CPAP; "think it was medication related" (05/14/2017)  . Thoracic aortic ectasia (HCC)    ~40 mm on Coronary Calcium Score CT - recommend f/u CTA or MRA  . TIA (transient ischemic attack) 09/2011   "several"    PAST SURGICAL HISTORY   Past Surgical History:  Procedure Laterality Date  . ABDOMINAL AORTOGRAM W/LOWER EXTREMITY N/A 05/14/2017   Procedure: Abdominal Aortogram w/Lower Extremity;  Surgeon: Conrad Cadillac, MD;  Location: [facility];  Service: Cardiovascular;  Laterality: N/A;  . ACROMIO-CLAVICULAR JOINT REPAIR Left 1990s   "I think it was called an AC joint removal"  . BACK SURGERY    . BYPASS GRAFT POPLITEAL TO POPLITEAL Right 05/15/2017   Procedure: BYPASS GRAFT ABOVE KNEE POPLITEAL TO BELOW KNEE POPLITEAL ARTERY;  Surgeon: Conrad Lake Ketchum, MD;  [facility];  Service: Vascular;  Laterality: Right;  . ESOPHAGOGASTRODUODENOSCOPY (EGD) WITH PROPOFOL N/A 04/29/2018   Procedure: ESOPHAGOGASTRODUODENOSCOPY (EGD) WITH PROPOFOL;  Surgeon: Otis Brace, MD;  Location: [facility];   Service: Gastroenterology;  Laterality: N/A;  . FOOT FRACTURE SURGERY Bilateral    multiple procedures  . FRACTURE SURGERY    . MAXIMUM ACCESS (MAS)POSTERIOR LUMBAR INTERBODY FUSION (PLIF) 1 LEVEL N/A 09/30/2014   Procedure: FOR MAXIMUM ACCESS (MAS) POSTERIOR LUMBAR INTERBODY FUSION (PLIF) 1 LEVEL;  Surgeon: Eustace Moore, MD;  Location: [facility];  Service: Neurosurgery;  Laterality: N/A;  FOR MAXIMUM ACCESS (MAS) POSTERIOR LUMBAR INTERBODY FUSION (PLIF) 1 LEVEL LUMBAR 4-5  . MULTIPLE TOOTH EXTRACTIONS Right 04/2017  . NM MYOVIEW LTD  06/2017    Normal EF, 66%. No ST changes. Normal study. LOW RISK.  Marland Kitchen TRANSTHORACIC ECHOCARDIOGRAM  09/07/2013   Done to evaluate TIA: Normal LV size and function. EF 55-60%. GR 1 DD. Mild left atrial dilation. Mildly calcified mitral leaflets. Otherwise normal.    MEDICATIONS/ALLERGIES   Current Meds  Medication Sig  . albuterol (PROVENTIL) (2.5 MG/3ML) 0.083% nebulizer solution Take 3 mLs (2.5 mg total) by nebulization 2 (two) times daily as needed for wheezing or shortness of breath.  Marland Kitchen albuterol (VENTOLIN HFA) 108 (90 Base) MCG/ACT inhaler Inhale 2 puffs into the lungs every 4 (four) hours as needed for wheezing or shortness of breath.  Marland Kitchen  ALPRAZolam (XANAX) 1 MG tablet Take 1 mg by mouth at bedtime as needed for anxiety.  Marland Kitchen amLODipine (NORVASC) 10 MG tablet Take 1 tablet (10 mg total) by mouth daily.  Jearl Klinefelter ELLIPTA 62.5-25 MCG/INH AEPB Inhale 2 puffs into the lungs daily as needed.  . ARIPiprazole (ABILIFY) 2 MG tablet Take 2 mg by mouth daily.  Marland Kitchen b complex vitamins tablet Take 1 tablet by mouth daily.  . budesonide-formoterol (SYMBICORT) 160-4.5 MCG/ACT inhaler Inhale 2 puffs into the lungs 2 (two) times daily.  Marland Kitchen buPROPion (WELLBUTRIN XL) 150 MG 24 hr tablet Take 150 mg by mouth daily.  . Calcium Carb-Cholecalciferol (CALTRATE 600+D3 PO) Take 1 tablet by mouth daily before breakfast.  . desvenlafaxine (PRISTIQ) 100 MG 24 hr tablet Take 100 mg by  mouth daily.  . Estradiol 10 MCG TABS vaginal tablet Place 10 mcg vaginally 2 (two) times a week.  . Evolocumab (REPATHA SURECLICK) [id number] MG/ML SOAJ Inject 140 mg into the skin every 14 (fourteen) days.  . fluticasone (FLONASE) 50 MCG/ACT nasal spray Place 2 sprays into both nostrils daily.   . methocarbamol (ROBAXIN) 750 MG tablet Take 1 tablet (750 mg total) by mouth 3 (three) times daily as needed for muscle spasms.  . Multiple Vitamins-Calcium (ONE-A-DAY WOMENS FORMULA) TABS Take 1 tablet by mouth daily with breakfast.  . mupirocin ointment (BACTROBAN) 2 % Apply 1 application topically 2 (two) times daily as needed (for facial irritation).   . pantoprazole (PROTONIX) 40 MG tablet Take 1 tablet (40 mg total) by mouth 2 (two) times daily. (Patient taking differently: Take 40 mg by mouth daily before breakfast. )  . Propylene Glycol-Glycerin (SOOTHE OP) Place 1 drop into both eyes 3 (three) times daily as needed (for dry eyes).   Marland Kitchen zolpidem (AMBIEN) 10 MG tablet Take 5-15 mg by mouth See admin instructions. Take 15 mg by mouth at bedtime and an additional 5 mg if still unable to sleep after an hour    Allergies  Allergen Reactions  . Penicillins Swelling and Other (See Comments)    TONGUE SWELLING  PATIENT HAD A PCN REACTION WITH IMMEDIATE RASH, FACIAL/TONGUE/THROAT SWELLING, SOB, OR LIGHTHEADEDNESS WITH HYPOTENSION:  #  #  #  YES  #  #  #   Has patient had a PCN reaction causing severe rash involving mucus membranes or skin necrosis: No Has patient had a PCN reaction that required hospitalization: No Has patient had a PCN reaction occurring within the last 10 years: No   . Statins     muscl pain  . Lyrica [Pregabalin] Other (See Comments)    DRY MOUTH     SOCIAL HISTORY/FAMILY HISTORY   Social History   Tobacco Use  . Smoking status: Heavy Tobacco Smoker    Packs/day: 0.02    Years: 36.00    Pack years: 0.72    Types: Cigarettes    Last attempt to quit: 04/24/2017    Years since  quitting: 2.7  . Smokeless tobacco: Never Used  Substance Use Topics  . Alcohol use: Yes    Alcohol/week: 3.0 standard drinks    Types: 3 Glasses of wine per week  . Drug use: No   Social History   Social History Narrative   Patient lives at home with her husband [name]. Patient has no children.    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    Patient has BA degree.    Smokes 2-3 cigarettes /day -- former  heavy smoker (failed "quitting") -- now says she has truly "QUIT" - however now is been going back and forth between quitting and cheating and probably smokes anywhere from 0-3 cigarettes a day.    Family History family history includes Heart attack in her mother; Stroke in her father and mother.   OBJCTIVE -PE, EKG, labs   Wt Readings from Last 3 Encounters:  01/08/20 152 lb 9.6 oz (69.2 kg)  01/01/20 153 lb (69.4 kg)  06/17/19 153 lb 14.1 oz (69.8 kg)    Physical Exam: BP 119/88   Pulse 92   Temp (!) 97.3 F (36.3 C)   Ht 5\' 5"  (1.651 m)   Wt 152 lb 9.6 oz (69.2 kg)   SpO2 95%   BMI 25.39 kg/m  Physical Exam  Constitutional: She is oriented to person, place, and time. She appears well-developed and well-nourished. No distress.  Pleasant woman who appears slightly older than stated age.  Otherwise well-groomed  HENT:  Head: Normocephalic and atraumatic.  Eyes: Pupils are equal, round, and reactive to light. EOM are normal.  Neck: No JVD present. No thyromegaly present.  Cardiovascular: Normal rate, regular rhythm, normal heart sounds and intact distal pulses.  No extrasystoles are present. PMI is not displaced. Exam reveals decreased pulses (Mildly decreased pedal pulses but otherwise palpable.). Exam reveals no gallop and no friction rub.  No murmur (Cannot exclude very soft 1/6 SEM at RUSB.) heard. Pulmonary/Chest: Effort normal and breath sounds normal. No respiratory distress. She has no wheezes. She has no rales.  Abdominal: Soft. Bowel sounds  are normal. She exhibits no distension. There is no abdominal tenderness.  Musculoskeletal:        General: No edema. Normal range of motion.     Cervical back: Normal range of motion and neck supple.     Comments: Significant antalgic gait walking with a cane.  Neurological: She is alert and oriented to person, place, and time.  Psychiatric: She has a normal mood and affect. Her behavior is normal. Judgment and thought content normal.  Vitals reviewed.    Adult ECG Report  Rate: 92;  Rhythm: normal sinus rhythm and Cannot exclude septal infarct, age-indeterminate.  Otherwise normal axis, intervals and durations.  Borderline voltage.;   Narrative Interpretation: No change in previous EKG, rate may be a little faster.  Recent Labs:   Lab Results  Component Value Date   CHOL 205 (H) 08/13/2019   HDL 61 08/13/2019   LDLCALC 84 08/13/2019   LDLDIRECT 186 (H) 03/30/2015   TRIG 301 (H) 08/13/2019   CHOLHDL 5.3 05/15/2017   Lab Results  Component Value Date   CREATININE 0.92 06/18/2019   BUN 11 06/18/2019   NA 136 06/18/2019   K 4.1 06/18/2019   CL 102 06/18/2019   CO2 20 (L) 06/18/2019    ASSESSMENT/PLAN    Problem List Items Addressed This Visit    Coronary artery disease, non-occlusive (Chronic)    Significant elevated coronary calcium score indicates clear-cut CAD, however nonischemic Myoview would argue against any occlusive disease.   She IS NOT having any active angina symptoms.  Plan for now is aggressive restrict modification.  Continue Repatha  On amlodipine -> if blood pressure would tolerate in the future, could consider beta-blocker, but not on for now.  Smoking cessation congratulated.       Relevant Orders   EKG 12-Lead   Hyperlipidemia with target LDL less than 70 (Chronic)    She is no longer on Livalo.  She is on Repatha.  Her LDL had been 45, most recent check was little higher.  At this point I think we will just simply continue with Repatha which  needs to be refilled.  If lipid levels do go up on follow-up checks, may want to consider an additional agent such as Nexletol.      Essential hypertension (Chronic)    Blood pressure looks pretty good today on amlodipine.      Cerebrovascular disease (Chronic)    History of TIAs.  No recurrent symptoms.  She probably should be on daily aspirin which she is not taking.  It would be okay to hold that for surgery. This does give her 1 preoperative risk point.  Despite that she is on aggressive medical management with blood pressure & lipid control along with smoking cessation.      Thoracic aortic ectasia (HCC) (Chronic)    Can discuss follow-up imaging next year.      Preoperative cardiovascular examination - Primary    Brenda Carney has been very stable from a cardiac standpoint.  She is on aggressive risk factor modification with blood pressure control and lipid control.  Her lipids have been dramatically improved with the initiation of Repatha although still not below 70 which would be our target.  She does have significant coronary artery calcification noted on CT scan but had a negative Myoview and is now over 2 years of being asymptomatic since that test. She has normal ejection fraction and no heart failure symptoms.  She is nondiabetic and has normal renal function.  PREOPERATIVE CARDIAC RISK ASSESSMENT   Revised Cardiac Risk Index:  High Risk Surgery: no; intermediate risk  Defined as Intraperitoneal, intrathoracic or suprainguinal vascular  Active CAD: NO; no angina.  Is on amlodipine as antianginal and blood pressure control medicine.  CHF: no; normal EF, no symptoms.  Cerebrovascular Disease: yes; History of TIA  Diabetes: no; On Insulin: no  CKD (Cr >~ 2): no; 0.92  Total: 1 Estimated Risk of Adverse Outcome: Low to intermediate risk Estimated Risk of MI, PE, VF/VT (Cardiac Arrest), Complete Heart Block: Roughly 3% based on history of TIA AND INTERMEDIATE RISK  SURGERY   ACC/AHA Guidelines for "Clearance":  Step 1 - Need for Emergency Surgery: No: Elective  If Yes - go straight to OR with perioperative surveillance  Step 2 - Active Cardiac Conditions (Unstable Angina, Decompensated HF, Significant  Arrhytmias - Complete HB, Mobitz II, Symptomatic VT or SVT, Severe Aortic Stenosis - mean gradient > 40 mmHg, Valve area < 1.0 cm2):   No: Has never had symptoms of unstable angina  If Yes - Evaluate & Treat per ACC/AHA Guidelines  Step 3 -  Low Risk Surgery: No: Intermediate risk  If Yes --> proceed to OR  If No --> Step 4  Step 4 - Functional Capacity >= 4 METS without symptoms: Yes, prior to debilitating hip pain  If Yes --> proceed to OR  If No --> Step 5  Step 5 --  Clinical Risk Factors (CRF)   3 or more: No: 1  If Yes -- assess Surgical Risk, --   (High Risk Non-cardiac), Intraabdominal or thoracic vascular surgery consider testing if it will change management.  Intermediate Risk: Proceed to OR with HR control, or consider testing if it will change management  1-2 or more CRFs: Yes  If Yes -- assess Surgical Risk, --   (High Risk Non-cardiac), Intraabdominal or thoracic vascular surgery --> Proceed to OR, or consider testing  if it will change management.  Intermediate Risk: Proceed to OR with HR control, or consider testing if it will change management  The patient is scheduled for intermediate risk surgery and is not having any active cardiac symptoms.  She is on aggressive medical management.  Although beta-blocker would help preoperative risk, the proximity to the planned surgery would make it less than favorable to initiate beta-blocker at this point.  Would recommend proceeding to the OR without any further cardiac evaluation.  Cardiology will be on standby if necessary in order to be any symptoms.  She will be considered a LOW TO INTERMEDIATE RISK PATIENT (based on history of TIA) for INTERMEDIATE RISK SURGERY         Relevant Orders   EKG 12-Lead       COVID-19 Education: The signs and symptoms of COVID-19 were discussed with the patient and how to seek care for testing (follow up with PCP or arrange E-visit).   The importance of social distancing was discussed today.  I spent a  of 97minutes with the patient and chart review. >  50% of the time was spent in direct patient consultation.  Additional time spent with chart review (studies, outside notes, etc): 8 Total Time: 29 min   Current medicines are reviewed at length with the patient today.  (+/- concerns) would be okay to hold aspirin (although she has not taking daily) for surgery.   Patient Instructions / Medication Changes & Studies & Tests Ordered   Patient Instructions  Medication Instructions:   NO CHANGES *If you need a refill on your cardiac medications before your next appointment, please call your pharmacy*  Lab Work: NOT NEEDED  Testing/Procedures: NOT NEEDED Follow-Up: At [facility], you and your health needs are our priority.  As part of our continuing mission to provide you with exceptional heart care, we have created designated Provider Care Teams.  These Care Teams include your primary Cardiologist (physician) and Advanced Practice Providers (APPs -  Physician Assistants and Nurse Practitioners) who all work together to provide you with the care you need, when you need it.  Your next appointment:   12 month(s)  The format for your next appointment:   In Person  Provider:   Glenetta Hew, MD  Other Instructions CLEARANCE FOR SURGERY , [place] HOLD ASPIRIN FOR SURGERY IF NEEDED    Studies Ordered:   Orders Placed This Encounter  Procedures  . EKG 12-Lead     Glenetta Hew, M.D., M.S. Interventional Cardiologist   Pager # [phone] Phone # [phone] [address] [place], [place] [address]   Thank you for choosing Heartcare at [facility]!!

## 2020-01-08 NOTE — Assessment & Plan Note (Signed)
Brenda Carney has been very stable from a cardiac standpoint.  She is on aggressive risk factor modification with blood pressure control and lipid control.  Her lipids have been dramatically improved with the initiation of Repatha although still not below 70 which would be our target.  She does have significant coronary artery calcification noted on CT scan but had a negative Myoview and is now over 2 years of being asymptomatic since that test. She has normal ejection fraction and no heart failure symptoms.  She is nondiabetic and has normal renal function.  PREOPERATIVE CARDIAC RISK ASSESSMENT   Revised Cardiac Risk Index:  High Risk Surgery: no; intermediate risk  Defined as Intraperitoneal, intrathoracic or suprainguinal vascular  Active CAD: NO; no angina.  Is on amlodipine as antianginal and blood pressure control medicine.  CHF: no; normal EF, no symptoms.  Cerebrovascular Disease: yes; History of TIA  Diabetes: no; On Insulin: no  CKD (Cr >~ 2): no; 0.92  Total: 1 Estimated Risk of Adverse Outcome: Low to intermediate risk Estimated Risk of MI, PE, VF/VT (Cardiac Arrest), Complete Heart Block: Roughly 3% based on history of TIA AND INTERMEDIATE RISK SURGERY   ACC/AHA Guidelines for "Clearance":  Step 1 - Need for Emergency Surgery: No: Elective  If Yes - go straight to OR with perioperative surveillance  Step 2 - Active Cardiac Conditions (Unstable Angina, Decompensated HF, Significant  Arrhytmias - Complete HB, Mobitz II, Symptomatic VT or SVT, Severe Aortic Stenosis - mean gradient > 40 mmHg, Valve area < 1.0 cm2):   No: Has never had symptoms of unstable angina  If Yes - Evaluate & Treat per ACC/AHA Guidelines  Step 3 -  Low Risk Surgery: No: Intermediate risk  If Yes --> proceed to OR  If No --> Step 4  Step 4 - Functional Capacity >= 4 METS without symptoms: Yes, prior to debilitating hip pain  If Yes --> proceed to OR  If No --> Step 5  Step 5 --  Clinical  Risk Factors (CRF)   3 or more: No: 1  If Yes -- assess Surgical Risk, --   (High Risk Non-cardiac), Intraabdominal or thoracic vascular surgery consider testing if it will change management.  Intermediate Risk: Proceed to OR with HR control, or consider testing if it will change management  1-2 or more CRFs: Yes  If Yes -- assess Surgical Risk, --   (High Risk Non-cardiac), Intraabdominal or thoracic vascular surgery --> Proceed to OR, or consider testing if it will change management.  Intermediate Risk: Proceed to OR with HR control, or consider testing if it will change management  The patient is scheduled for intermediate risk surgery and is not having any active cardiac symptoms.  She is on aggressive medical management.  Although beta-blocker would help preoperative risk, the proximity to the planned surgery would make it less than favorable to initiate beta-blocker at this point.  Would recommend proceeding to the OR without any further cardiac evaluation.  Cardiology will be on standby if necessary in order to be any symptoms.  She will be considered a LOW TO INTERMEDIATE RISK PATIENT (based on history of TIA) for INTERMEDIATE RISK SURGERY

## 2020-01-08 NOTE — Assessment & Plan Note (Signed)
Significant elevated coronary calcium score indicates clear-cut CAD, however nonischemic Myoview would argue against any occlusive disease.   She IS NOT having any active angina symptoms.  Plan for now is aggressive restrict modification.  Continue Repatha  On amlodipine -> if blood pressure would tolerate in the future, could consider beta-blocker, but not on for now.  Smoking cessation congratulated.

## 2020-01-08 NOTE — Assessment & Plan Note (Signed)
Blood pressure looks pretty good today on amlodipine.

## 2020-01-09 NOTE — Telephone Encounter (Signed)
   Primary Cardiologist: Glenetta Hew, MD  Chart reviewed as part of pre-operative protocol coverage. Per Dr. Ellyn Hack, based on ACC/AHA guidelines, Brenda Carney would be at acceptable risk for the planned procedure without further cardiovascular testing.   I will route this recommendation to the requesting party via Epic fax function and remove from pre-op pool.  Please call with questions.  Abigail Butts, PA-C 01/09/2020, 8:43 AM

## 2020-01-13 ENCOUNTER — Other Ambulatory Visit (HOSPITAL_COMMUNITY)
Admission: RE | Admit: 2020-01-13 | Discharge: 2020-01-13 | Disposition: A | Discharge status: Home / Self Care | Payer: Medicare Other | Source: Ambulatory Visit | Attending: Orthopaedic Surgery | Admitting: Orthopaedic Surgery

## 2020-01-13 DIAGNOSIS — Z01812 Encounter for preprocedural laboratory examination: Secondary | ICD-10-CM | POA: Diagnosis not present

## 2020-01-13 DIAGNOSIS — Z20822 Contact with and (suspected) exposure to covid-19: Secondary | ICD-10-CM | POA: Diagnosis not present

## 2020-01-14 LAB — NOVEL CORONAVIRUS, NAA (HOSP ORDER, SEND-OUT TO REF LAB; TAT 18-24 HRS): SARS-CoV-2, NAA: NOT DETECTED

## 2020-01-14 NOTE — Patient Instructions (Signed)
DUE TO COVID-19 ONLY ONE VISITOR IS ALLOWED TO COME WITH YOU AND STAY IN THE WAITING ROOM ONLY DURING PRE OP AND PROCEDURE DAY OF SURGERY. THE 1 VISITOR MAY VISIT WITH YOU AFTER SURGERY IN YOUR PRIVATE ROOM DURING VISITING HOURS ONLY!  YOU NEED TO HAVE A COVID 19 TEST ON_______ @_______ , THIS TEST MUST BE DONE BEFORE SURGERY, COME  [place], [place] , [address].  ([place]) ONCE YOUR COVID TEST IS COMPLETED, PLEASE BEGIN THE QUARANTINE INSTRUCTIONS AS OUTLINED IN YOUR HANDOUT.                Brenda Carney    Your procedure is scheduled on: 01/16/20   Report to [facility] Main  Entrance   Report to admitting at   8:30 AM     Call this number if you have problems the morning of surgery [place], NO CHEWING GUM [place].   Do not eat food After Midnight  . YOU MAY HAVE CLEAR LIQUIDS FROM MIDNIGHT UNTIL 8:00 AM.    CLEAR LIQUID DIET   Foods Allowed                                                                     Foods Excluded  Coffee and tea, regular and decaf                             liquids that you cannot  Plain Jell-O any favor except red or purple                                           see through such as: Fruit ices (not with fruit pulp)                                     milk, soups, orange juice  Iced Popsicles                                    All solid food Carbonated beverages, regular and diet                                    Cranberry, grape and apple juices Sports drinks like Gatorade Lightly seasoned clear broth or consume(fat free) Sugar, honey syrup   At 8:00 AM Please finish the prescribed Pre-Surgery  drink.   Nothing by mouth after you finish the  drink !    Take these medicines the morning of surgery with A SIP OF WATER:  Wellbutrin, Pristiq, Abilify, Norvasc, Protonix, use your inhalers and bring them with you to the  hospital  You may not have any metal on your body including hair pins and              piercings  Do not wear jewelry, make-up, lotions, powders or perfumes, deodorant             Do not wear nail polish on your fingernails.  Do not shave  48 hours prior to surgery.            Do not bring valuables to the hospital. [place].  Contacts, dentures or bridgework may not be worn into surgery.      Patients discharged the day of surgery will not be allowed to drive home.   IF YOU ARE HAVING SURGERY AND GOING HOME THE SAME DAY, YOU MUST HAVE AN ADULT TO DRIVE YOU HOME AND BE WITH YOU FOR 24 HOURS.   YOU MAY GO HOME BY TAXI OR UBER OR ORTHERWISE, BUT AN ADULT MUST ACCOMPANY YOU HOME AND STAY WITH YOU FOR 24 HOURS.  Name and phone number of your driver:  Special Instructions: N/A              Please read over the following fact sheets you were given: _____________________________________________________________________             [facility] - Preparing for Surgery  Before surgery, you can play an important role.   Because skin is not sterile, your skin needs to be as free of germs as possible.   You can reduce the number of germs on your skin by washing with CHG (chlorahexidine gluconate) soap before surgery.   CHG is an antiseptic cleaner which kills germs and bonds with the skin to continue killing germs even after washing. Please DO NOT use if you have an allergy to CHG or antibacterial soaps .  If your skin becomes reddened/irritated stop using the CHG and inform your nurse when you arrive at Short Stay. Do not shave (including legs and underarms) for at least 48 hours prior to the first CHG shower.    Please follow these instructions carefully:  1.  Shower with CHG Soap the night before surgery and the  morning of Surgery.  2.  If you choose to wash your hair, wash your hair first as usual with  your  normal  shampoo.  3.  After you shampoo, rinse your hair and body thoroughly to remove the  shampoo.                                        4.  Use CHG as you would any other liquid soap.  You can apply chg directly  to the skin and wash                       Gently with a scrungie or clean washcloth.  5.  Apply the CHG Soap to your body ONLY FROM THE NECK DOWN.   Do not use on face/ open                           Wound or open sores. Avoid contact with eyes, ears mouth and genitals (private parts).  Wash face,  Genitals (private parts) with your normal soap.             6.  Wash thoroughly, paying special attention to the area where your surgery  will be performed.  7.  Thoroughly rinse your body with warm water from the neck down.  8.  DO NOT shower/wash with your normal soap after using and rinsing off  the CHG Soap.             9.  Pat yourself dry with a clean towel.            10.  Wear clean pajamas.            11.  Place clean sheets on your bed the night of your first shower and do not  sleep with pets. Day of Surgery : Do not apply any lotions/deodorants the morning of surgery.  Please wear clean clothes to the hospital/surgery center.  FAILURE TO FOLLOW THESE INSTRUCTIONS MAY RESULT IN THE CANCELLATION OF YOUR SURGERY PATIENT SIGNATURE_________________________________  NURSE SIGNATURE__________________________________  ___  Incentive Spirometer  An incentive spirometer is a tool that can help keep your lungs clear and active. This tool measures how well you are filling your lungs with each breath. Taking long deep breaths may help reverse or decrease the chance of developing breathing (pulmonary) problems (especially infection) following:  A long period of time when you are unable to move or be active. BEFORE THE PROCEDURE   If the spirometer includes an indicator to show your best effort, your nurse or respiratory therapist will set it to a desired  goal.  If possible, sit up straight or lean slightly forward. Try not to slouch.  Hold the incentive spirometer in an upright position. INSTRUCTIONS FOR USE  1. Sit on the edge of your bed if possible, or sit up as far as you can in bed or on a chair. 2. Hold the incentive spirometer in an upright position. 3. Breathe out normally. 4. Place the mouthpiece in your mouth and seal your lips tightly around it. 5. Breathe in slowly and as deeply as possible, raising the piston or the ball toward the top of the column. 6. Hold your breath for 3-5 seconds or for as long as possible. Allow the piston or ball to fall to the bottom of the column. 7. Remove the mouthpiece from your mouth and breathe out normally. 8. Rest for a few seconds and repeat Steps 1 through 7 at least 10 times every 1-2 hours when you are awake. Take your time and take a few normal breaths between deep breaths. 9. The spirometer may include an indicator to show your best effort. Use the indicator as a goal to work toward during each repetition. 10. After each set of 10 deep breaths, practice coughing to be sure your lungs are clear. If you have an incision (the cut made at the time of surgery), support your incision when coughing by placing a pillow or rolled up towels firmly against it. Once you are able to get out of bed, walk around indoors and cough well. You may stop using the incentive spirometer when instructed by your caregiver.  RISKS AND COMPLICATIONS  Take your time so you do not get dizzy or light-headed.  If you are in pain, you may need to take or ask for pain medication before doing incentive spirometry. It is harder to take a deep breath if you are having pain. AFTER USE  Rest  and breathe slowly and easily.  It can be helpful to keep track of a log of your progress. Your caregiver can provide you with a simple table to help with this. If you are using the spirometer at home, follow these instructions: [place] IF:   You are having difficultly using the spirometer.  You have trouble using the spirometer as often as instructed.  Your pain medication is not giving enough relief while using the spirometer.  You develop fever of 100.5 F (38.1 C) or higher. SEEK IMMEDIATE MEDICAL CARE IF:   You cough up bloody sputum that had not been present before.  You develop fever of 102 F (38.9 C) or greater.  You develop worsening pain at or near the incision site. MAKE SURE YOU:   Understand these instructions.  Will watch your condition.  Will get help right away if you are not doing well or get worse. Document Released: 04/23/2007 Document Revised: 03/04/2012 Document Reviewed: 06/24/2007 [facility] Patient Information 2014 [employer], [place].   ________________________________________________________________________ _____________________________________________________________________

## 2020-01-15 ENCOUNTER — Telehealth: Payer: Self-pay | Admitting: *Deleted

## 2020-01-15 ENCOUNTER — Encounter (HOSPITAL_COMMUNITY): Payer: Self-pay

## 2020-01-15 ENCOUNTER — Other Ambulatory Visit: Payer: Self-pay

## 2020-01-15 ENCOUNTER — Other Ambulatory Visit (HOSPITAL_COMMUNITY): Payer: Medicare Other

## 2020-01-15 ENCOUNTER — Encounter (HOSPITAL_COMMUNITY)
Admission: RE | Admit: 2020-01-15 | Discharge: 2020-01-15 | Disposition: A | Discharge status: Home / Self Care | Payer: Medicare Other | Source: Ambulatory Visit | Attending: Orthopaedic Surgery | Admitting: Orthopaedic Surgery

## 2020-01-15 DIAGNOSIS — J449 Chronic obstructive pulmonary disease, unspecified: Secondary | ICD-10-CM | POA: Insufficient documentation

## 2020-01-15 DIAGNOSIS — I10 Essential (primary) hypertension: Secondary | ICD-10-CM | POA: Insufficient documentation

## 2020-01-15 DIAGNOSIS — I739 Peripheral vascular disease, unspecified: Secondary | ICD-10-CM | POA: Insufficient documentation

## 2020-01-15 DIAGNOSIS — I77810 Thoracic aortic ectasia: Secondary | ICD-10-CM | POA: Insufficient documentation

## 2020-01-15 DIAGNOSIS — I2510 Atherosclerotic heart disease of native coronary artery without angina pectoris: Secondary | ICD-10-CM | POA: Insufficient documentation

## 2020-01-15 DIAGNOSIS — Z79899 Other long term (current) drug therapy: Secondary | ICD-10-CM | POA: Insufficient documentation

## 2020-01-15 DIAGNOSIS — M47812 Spondylosis without myelopathy or radiculopathy, cervical region: Secondary | ICD-10-CM | POA: Insufficient documentation

## 2020-01-15 DIAGNOSIS — G4730 Sleep apnea, unspecified: Secondary | ICD-10-CM | POA: Insufficient documentation

## 2020-01-15 DIAGNOSIS — M47817 Spondylosis without myelopathy or radiculopathy, lumbosacral region: Secondary | ICD-10-CM | POA: Insufficient documentation

## 2020-01-15 DIAGNOSIS — Z01812 Encounter for preprocedural laboratory examination: Secondary | ICD-10-CM | POA: Insufficient documentation

## 2020-01-15 DIAGNOSIS — M879 Osteonecrosis, unspecified: Secondary | ICD-10-CM | POA: Insufficient documentation

## 2020-01-15 DIAGNOSIS — F17200 Nicotine dependence, unspecified, uncomplicated: Secondary | ICD-10-CM | POA: Insufficient documentation

## 2020-01-15 DIAGNOSIS — F329 Major depressive disorder, single episode, unspecified: Secondary | ICD-10-CM | POA: Insufficient documentation

## 2020-01-15 DIAGNOSIS — Z8673 Personal history of transient ischemic attack (TIA), and cerebral infarction without residual deficits: Secondary | ICD-10-CM | POA: Insufficient documentation

## 2020-01-15 DIAGNOSIS — E785 Hyperlipidemia, unspecified: Secondary | ICD-10-CM | POA: Insufficient documentation

## 2020-01-15 DIAGNOSIS — K219 Gastro-esophageal reflux disease without esophagitis: Secondary | ICD-10-CM | POA: Insufficient documentation

## 2020-01-15 DIAGNOSIS — M1389 Other specified arthritis, multiple sites: Secondary | ICD-10-CM | POA: Insufficient documentation

## 2020-01-15 LAB — BASIC METABOLIC PANEL
Anion gap: 11 (ref 5–15)
BUN: 11 mg/dL (ref 8–23)
CO2: 24 mmol/L (ref 22–32)
Calcium: 9.3 mg/dL (ref 8.9–10.3)
Chloride: 102 mmol/L (ref 98–111)
Creatinine, Ser: 0.88 mg/dL (ref 0.44–1.00)
GFR calc Af Amer: >60 mL/min (ref >60)
GFR calc non Af Amer: >60 mL/min (ref >60)
Glucose, Bld: 98 mg/dL (ref 70–99)
Potassium: 3.2 mmol/L — ABNORMAL LOW (ref 3.5–5.1)
Sodium: 137 mmol/L (ref 135–145)

## 2020-01-15 LAB — CBC
HCT: 44.5 % (ref 36.0–46.0)
Hemoglobin: 15.5 g/dL — ABNORMAL HIGH (ref 12.0–15.0)
MCH: 33.9 pg (ref 26.0–34.0)
MCHC: 34.8 g/dL (ref 30.0–36.0)
MCV: 97.4 fL (ref 80.0–100.0)
Platelets: 423 10*3/uL — ABNORMAL HIGH (ref 150–400)
RBC: 4.57 MIL/uL (ref 3.87–5.11)
RDW: 12.6 % (ref 11.5–15.5)
WBC: 11.7 10*3/uL — ABNORMAL HIGH (ref 4.0–10.5)
nRBC: 0 % (ref 0.0–0.2)

## 2020-01-15 LAB — SURGICAL PCR SCREEN
MRSA, PCR: NEGATIVE
Staphylococcus aureus: NEGATIVE

## 2020-01-15 NOTE — Progress Notes (Signed)
Anesthesia Chart Review   Case: [id number] Date/Time: 01/16/20 1045   Procedure: RIGHT TOTAL HIP ARTHROPLASTY ANTERIOR APPROACH (Right Hip)   Anesthesia type: Spinal   Pre-op diagnosis: avascular necrosis right hip   Location: [facility] ROOM 10 / [facility]   Surgeons: Mcarthur Rossetti, MD      DISCUSSION:69 y.o. heavy smoker (0.72 pack years) with h/o TIA, HTN, GERD, COPD, HLD, non-occlusive CAD, PAD, asthma, sleep apnea, right hip AVN scheduled for above procedure 01/16/20 with Dr. Jean Rosenthal.  Pt seen by cardiologist, Dr. Glenetta Hew, 01/08/20 for preoperative evaluation.  Per OV note, "The patient is scheduled for intermediate risk surgery and is not having any active cardiac symptoms.  She is on aggressive medical management.  Although beta-blocker would help preoperative risk, the proximity to the planned surgery would make it less than favorable to initiate beta-blocker at this point.  Would recommend proceeding to the OR without any further cardiac evaluation.  Cardiology will be on standby if necessary in order to be any symptoms."  Anticipate pt can proceed with planned procedure barring acute status change.   VS: BP (!) 115/94   Pulse 97   Temp 37 C (Oral)   Resp 16   Ht 5\' 5"  (1.651 m)   Wt 68 kg   SpO2 98%   BMI 24.96 kg/m   PROVIDERS: Leeroy Cha, MD is PCP   Glenetta Hew, MD is Cardiologist  LABS: Labs reviewed: Acceptable for surgery. (all labs ordered are listed, but only abnormal results are displayed)  Labs Reviewed  BASIC METABOLIC PANEL - Abnormal; Notable for the following components:      Result Value   Potassium 3.2 (*)    All other components within normal limits  CBC - Abnormal; Notable for the following components:   WBC 11.7 (*)    Hemoglobin 15.5 (*)    Platelets 423 (*)    All other components within normal limits  SURGICAL PCR SCREEN     IMAGES:   EKG: 01/08/20 Rate 89 bpm Normal sinus rhythm  Septal infarct, age  undetermined   CV: Myocardial Perfusion Imaging 07/17/2017   Nuclear stress EF: 66%.  The left ventricular ejection fraction is hyperdynamic (>65%).  There was no ST segment deviation noted during stress.  The study is normal.  This is a low risk study.   Low risk stress nuclear study with normal perfusion and normal left ventricular regional and global systolic function.  Past Medical History:  Diagnosis Date  . Arthritis    "thumbs; hips" (05/14/2017)  . 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 Cerebrovascular disease    Carotid dopplers which showed no significant increase in velocities, extremely minimal on the left, antegrade vertebral bilaterally.  . Cervical spondylosis   . Chronic lower back pain   . COPD (chronic obstructive pulmonary disease) ([place])    "little bit" (05/14/2017)  . Coronary artery disease, non-occlusive    Coronary calcium scoring: June 2018: Score 1106. 98 percentile for age -[place]  . Depression   . Exercise-induced asthma with acute exacerbation    "only in very hot weather" (05/14/2017)  . GERD (gastroesophageal reflux disease)   . History of IBS    resovlved  . History of kidney stones   . 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 Hyperlipidemia with target LDL less than 70    Mostly statin intolerant,.  Currently on Livalo 4 mg  . Hypertension, benign   . Lumbosacral spondylosis   . PAD (peripheral artery disease) (HCC)    Status post right above-the-knee-below the knee popliteal artery bypass; postop right ABI 0.96.  Marland Kitchen Pneumonia 2020  . Sleep apnea    does not wear CPAP; "think it was medication related" (05/14/2017)  . Stroke [facility]) 2010   TIA  . Thoracic aortic ectasia (HCC)    ~40 mm on Coronary Calcium Score CT - recommend f/u CTA or MRA  . TIA (transient ischemic attack) 09/2011   "several"    Past Surgical History:  Procedure  Laterality Date  . ABDOMINAL AORTOGRAM W/LOWER EXTREMITY N/A 05/14/2017   Procedure: Abdominal Aortogram w/Lower Extremity;  Surgeon: Conrad Maben, MD;  Location: [place] CV LAB;  Service: Cardiovascular;  Laterality: N/A;  . ACROMIO-CLAVICULAR JOINT REPAIR Left 1990s   "I think it was called an AC joint removal"  . BACK SURGERY    . BYPASS GRAFT POPLITEAL TO POPLITEAL Right 05/15/2017   Procedure: BYPASS GRAFT ABOVE KNEE POPLITEAL TO BELOW KNEE POPLITEAL ARTERY;  Surgeon: Conrad [place], MD;  Location: [facility] OR;  Service: Vascular;  Laterality: Right;  . ESOPHAGOGASTRODUODENOSCOPY (EGD) WITH PROPOFOL N/A 04/29/2018   Procedure: ESOPHAGOGASTRODUODENOSCOPY (EGD) WITH PROPOFOL;  Surgeon: Otis Brace, MD;  Location: [place];  Service: Gastroenterology;  Laterality: N/A;  . FOOT FRACTURE SURGERY Bilateral    multiple procedures  . FRACTURE SURGERY    . MAXIMUM ACCESS (MAS)POSTERIOR LUMBAR INTERBODY FUSION (PLIF) 1 LEVEL N/A 09/30/2014   Procedure: FOR MAXIMUM ACCESS (MAS) POSTERIOR LUMBAR INTERBODY FUSION (PLIF) 1 LEVEL;  Surgeon: Eustace Moore, MD;  Location: [place] NEURO ORS;  Service: Neurosurgery;  Laterality: N/A;  FOR MAXIMUM ACCESS (MAS) POSTERIOR LUMBAR INTERBODY FUSION (PLIF) 1 LEVEL LUMBAR 4-5  . MULTIPLE TOOTH EXTRACTIONS Right 04/2017  . NM MYOVIEW LTD  06/2017    Normal EF, 66%. No ST changes. Normal study. LOW RISK.  Marland Kitchen TRANSTHORACIC ECHOCARDIOGRAM  09/07/2013   Done to evaluate TIA: Normal LV size and function. EF 55-60%. GR 1 DD. Mild left atrial dilation. Mildly calcified mitral leaflets. Otherwise normal.    MEDICATIONS: . albuterol (PROVENTIL) (2.5 MG/3ML) 0.083% nebulizer solution  . albuterol (VENTOLIN HFA) 108 (90 Base) MCG/ACT inhaler  . ALPRAZolam (XANAX) 1 MG tablet  . amLODipine (NORVASC) 10 MG tablet  . ANORO ELLIPTA 62.5-25 MCG/INH AEPB  . ARIPiprazole (ABILIFY) 2 MG tablet  . ascorbic acid (VITAMIN C) 500 MG tablet  . b complex vitamins tablet  .  budesonide-formoterol (SYMBICORT) 160-4.5 MCG/ACT inhaler  . buPROPion (WELLBUTRIN XL) 150 MG 24 hr tablet  . Calcium Carb-Cholecalciferol (CALTRATE 600+D3 PO)  . desvenlafaxine (PRISTIQ) 100 MG 24 hr tablet  . Estradiol 10 MCG TABS vaginal tablet  . Evolocumab (REPATHA SURECLICK) [id number] MG/ML SOAJ  . methocarbamol (ROBAXIN) 750 MG tablet  . Multiple Vitamins-Calcium (ONE-A-DAY WOMENS FORMULA) TABS  . pantoprazole (PROTONIX) 40 MG tablet  . Propylene Glycol-Glycerin (SOOTHE OP)  . tiotropium (SPIRIVA HANDIHALER) 18 MCG inhalation capsule  . zolpidem (AMBIEN) 10 MG tablet   No current facility-administered medications for this encounter.    Maia Plan [facility] Pre-Surgical Testing [phone] 01/15/20  2:10 PM

## 2020-01-15 NOTE — Progress Notes (Signed)
PCP - Dr. Charmaine Downs Cardiologist -   Chest x-ray - 07/24/19 EKG - 01/08/20 Stress Test - 07/17/17 ECHO - 2014 Cardiac Cath -  No  Sleep Study - no CPAP -   Fasting Blood Sugar - NA Checks Blood Sugar _____ times a day  Blood Thinner Instructions:NA Aspirin Instructions: Last Dose:  Anesthesia review:   Patient denies shortness of breath, fever, cough and chest pain at PAT appointment yes  Patient verbalized understanding of instructions that were given to them at the PAT appointment. Patient was also instructed that they will need to review over the PAT instructions again at home before surgery. yes

## 2020-01-15 NOTE — Care Plan (Signed)
RNCM call to patient to discuss her upcoming Right THA with Dr. Ninfa Linden on 01/16/20. Patient is an Ortho bundle patient through [facility] and reviewed this with her. Patient has a paid CG that will be helping her at home after a brief hospital stay. She has an elevated toilet seat, stool for the shower, and cane. She will need a FWW. This has been ordered through [place]. Anticipate home health therapy will be needed after short hospital stay. Choice provided and referral made to [facility]. Will continue to follow for any further CM needs. Please contact Jamse Arn, RN, BSN, CCM for any questions or needs. [phone].

## 2020-01-15 NOTE — Telephone Encounter (Signed)
Ortho bundle pre-op call completed. 

## 2020-01-16 ENCOUNTER — Inpatient Hospital Stay (HOSPITAL_COMMUNITY)
Admission: RE | Admit: 2020-01-16 | Discharge: 2020-01-18 | DRG: 470 | Disposition: A | Discharge status: Home Health | Payer: Medicare Other | Attending: Orthopaedic Surgery | Admitting: Orthopaedic Surgery

## 2020-01-16 ENCOUNTER — Encounter (HOSPITAL_COMMUNITY)
Admission: RE | Disposition: A | Discharge status: Home / Self Care | Payer: Self-pay | Source: Home / Self Care | Attending: Orthopaedic Surgery

## 2020-01-16 ENCOUNTER — Ambulatory Visit (HOSPITAL_COMMUNITY): Payer: Medicare Other | Admitting: Physician Assistant

## 2020-01-16 ENCOUNTER — Ambulatory Visit (HOSPITAL_COMMUNITY): Payer: Medicare Other

## 2020-01-16 ENCOUNTER — Telehealth (HOSPITAL_COMMUNITY): Payer: Self-pay | Admitting: *Deleted

## 2020-01-16 ENCOUNTER — Observation Stay (HOSPITAL_COMMUNITY): Payer: Medicare Other

## 2020-01-16 ENCOUNTER — Encounter (HOSPITAL_COMMUNITY): Payer: Self-pay | Admitting: Orthopaedic Surgery

## 2020-01-16 DIAGNOSIS — F17210 Nicotine dependence, cigarettes, uncomplicated: Secondary | ICD-10-CM | POA: Diagnosis present

## 2020-01-16 DIAGNOSIS — M879 Osteonecrosis, unspecified: Secondary | ICD-10-CM | POA: Diagnosis not present

## 2020-01-16 DIAGNOSIS — E785 Hyperlipidemia, unspecified: Secondary | ICD-10-CM | POA: Diagnosis not present

## 2020-01-16 DIAGNOSIS — I2510 Atherosclerotic heart disease of native coronary artery without angina pectoris: Secondary | ICD-10-CM | POA: Diagnosis present

## 2020-01-16 DIAGNOSIS — Z8249 Family history of ischemic heart disease and other diseases of the circulatory system: Secondary | ICD-10-CM

## 2020-01-16 DIAGNOSIS — M47817 Spondylosis without myelopathy or radiculopathy, lumbosacral region: Secondary | ICD-10-CM | POA: Diagnosis present

## 2020-01-16 DIAGNOSIS — Z880 Allergy status to penicillin: Secondary | ICD-10-CM

## 2020-01-16 DIAGNOSIS — Z419 Encounter for procedure for purposes other than remedying health state, unspecified: Secondary | ICD-10-CM

## 2020-01-16 DIAGNOSIS — Z8673 Personal history of transient ischemic attack (TIA), and cerebral infarction without residual deficits: Secondary | ICD-10-CM | POA: Diagnosis not present

## 2020-01-16 DIAGNOSIS — J449 Chronic obstructive pulmonary disease, unspecified: Secondary | ICD-10-CM | POA: Diagnosis not present

## 2020-01-16 DIAGNOSIS — Z79899 Other long term (current) drug therapy: Secondary | ICD-10-CM

## 2020-01-16 DIAGNOSIS — I671 Cerebral aneurysm, nonruptured: Secondary | ICD-10-CM | POA: Diagnosis present

## 2020-01-16 DIAGNOSIS — M47812 Spondylosis without myelopathy or radiculopathy, cervical region: Secondary | ICD-10-CM | POA: Diagnosis present

## 2020-01-16 DIAGNOSIS — J45909 Unspecified asthma, uncomplicated: Secondary | ICD-10-CM | POA: Diagnosis not present

## 2020-01-16 DIAGNOSIS — I10 Essential (primary) hypertension: Secondary | ICD-10-CM | POA: Diagnosis not present

## 2020-01-16 DIAGNOSIS — G8929 Other chronic pain: Secondary | ICD-10-CM | POA: Diagnosis present

## 2020-01-16 DIAGNOSIS — D649 Anemia, unspecified: Secondary | ICD-10-CM | POA: Diagnosis present

## 2020-01-16 DIAGNOSIS — I77810 Thoracic aortic ectasia: Secondary | ICD-10-CM | POA: Diagnosis present

## 2020-01-16 DIAGNOSIS — G4730 Sleep apnea, unspecified: Secondary | ICD-10-CM | POA: Diagnosis present

## 2020-01-16 DIAGNOSIS — Z8619 Personal history of other infectious and parasitic diseases: Secondary | ICD-10-CM

## 2020-01-16 DIAGNOSIS — Z20822 Contact with and (suspected) exposure to covid-19: Secondary | ICD-10-CM | POA: Diagnosis not present

## 2020-01-16 DIAGNOSIS — M87051 Idiopathic aseptic necrosis of right femur: Secondary | ICD-10-CM

## 2020-01-16 DIAGNOSIS — J441 Chronic obstructive pulmonary disease with (acute) exacerbation: Secondary | ICD-10-CM | POA: Diagnosis not present

## 2020-01-16 DIAGNOSIS — Z96641 Presence of right artificial hip joint: Secondary | ICD-10-CM | POA: Diagnosis not present

## 2020-01-16 DIAGNOSIS — I739 Peripheral vascular disease, unspecified: Secondary | ICD-10-CM | POA: Diagnosis not present

## 2020-01-16 DIAGNOSIS — Z7951 Long term (current) use of inhaled steroids: Secondary | ICD-10-CM

## 2020-01-16 DIAGNOSIS — K219 Gastro-esophageal reflux disease without esophagitis: Secondary | ICD-10-CM | POA: Diagnosis present

## 2020-01-16 DIAGNOSIS — Z471 Aftercare following joint replacement surgery: Secondary | ICD-10-CM | POA: Diagnosis not present

## 2020-01-16 DIAGNOSIS — Z888 Allergy status to other drugs, medicaments and biological substances status: Secondary | ICD-10-CM

## 2020-01-16 DIAGNOSIS — F329 Major depressive disorder, single episode, unspecified: Secondary | ICD-10-CM | POA: Diagnosis not present

## 2020-01-16 DIAGNOSIS — M1990 Unspecified osteoarthritis, unspecified site: Secondary | ICD-10-CM | POA: Diagnosis present

## 2020-01-16 DIAGNOSIS — Z823 Family history of stroke: Secondary | ICD-10-CM

## 2020-01-16 HISTORY — PX: TOTAL HIP ARTHROPLASTY: SHX124

## 2020-01-16 SURGERY — ARTHROPLASTY, HIP, TOTAL, ANTERIOR APPROACH
Anesthesia: Spinal | Site: Hip | Laterality: Right

## 2020-01-16 MED ORDER — ALPRAZOLAM 1 MG PO TABS
1.0000 mg | ORAL_TABLET | Freq: Every evening | ORAL | Status: DC | PRN
  Administered 2020-01-16 – 2020-01-17 (×2): 1 mg via ORAL
  Filled 2020-01-16 (×2): qty 1

## 2020-01-16 MED ORDER — CLINDAMYCIN PHOSPHATE 900 MG/50ML IV SOLN
900.0000 mg | INTRAVENOUS | Status: AC
  Administered 2020-01-16: 900 mg via INTRAVENOUS
  Filled 2020-01-16: qty 50

## 2020-01-16 MED ORDER — CHLORHEXIDINE GLUCONATE 4 % EX LIQD: 60.0000 mL | Freq: Once | CUTANEOUS | Status: DC

## 2020-01-16 MED ORDER — ONDANSETRON HCL 4 MG/2ML IJ SOLN: 4.0000 mg | Freq: Four times a day (QID) | INTRAMUSCULAR | Status: DC | PRN

## 2020-01-16 MED ORDER — ALBUTEROL SULFATE HFA 108 (90 BASE) MCG/ACT IN AERS: 2.0000 | INHALATION_SPRAY | RESPIRATORY_TRACT | Status: DC | PRN

## 2020-01-16 MED ORDER — TIOTROPIUM BROMIDE MONOHYDRATE 18 MCG IN CAPS: 18.0000 ug | ORAL_CAPSULE | Freq: Every day | RESPIRATORY_TRACT | Status: DC

## 2020-01-16 MED ORDER — METOCLOPRAMIDE HCL 5 MG PO TABS: 5.0000 mg | ORAL_TABLET | Freq: Three times a day (TID) | ORAL | Status: DC | PRN

## 2020-01-16 MED ORDER — CLINDAMYCIN PHOSPHATE 600 MG/50ML IV SOLN
600.0000 mg | Freq: Four times a day (QID) | INTRAVENOUS | Status: AC
  Administered 2020-01-16 (×2): 600 mg via INTRAVENOUS
  Filled 2020-01-16 (×2): qty 50

## 2020-01-16 MED ORDER — STERILE WATER FOR IRRIGATION IR SOLN
Status: DC | PRN
  Administered 2020-01-16 (×2): 1000 mL

## 2020-01-16 MED ORDER — DEXMEDETOMIDINE HCL IN NACL 200 MCG/50ML IV SOLN
INTRAVENOUS | Status: DC | PRN
  Administered 2020-01-16 (×5): 4 ug via INTRAVENOUS

## 2020-01-16 MED ORDER — UMECLIDINIUM-VILANTEROL 62.5-25 MCG/INH IN AEPB: 2.0000 | INHALATION_SPRAY | Freq: Every day | RESPIRATORY_TRACT | Status: DC | PRN

## 2020-01-16 MED ORDER — 0.9 % SODIUM CHLORIDE (POUR BTL) OPTIME
TOPICAL | Status: DC | PRN
  Administered 2020-01-16: 1000 mL

## 2020-01-16 MED ORDER — ONDANSETRON HCL 4 MG/2ML IJ SOLN
INTRAMUSCULAR | Status: DC | PRN
  Administered 2020-01-16: 4 mg via INTRAVENOUS

## 2020-01-16 MED ORDER — BUPROPION HCL ER (XL) 150 MG PO TB24
150.0000 mg | ORAL_TABLET | Freq: Every day | ORAL | Status: DC
  Administered 2020-01-17 – 2020-01-18 (×2): 150 mg via ORAL
  Filled 2020-01-16 (×2): qty 1

## 2020-01-16 MED ORDER — ALBUMIN HUMAN 5 % IV SOLN: 12.5000 g | Freq: Once | INTRAVENOUS | Status: AC

## 2020-01-16 MED ORDER — FENTANYL CITRATE (PF) 100 MCG/2ML IJ SOLN
INTRAMUSCULAR | Status: AC
  Administered 2020-01-16: 25 ug via INTRAVENOUS
  Filled 2020-01-16: qty 2

## 2020-01-16 MED ORDER — PROPOFOL 10 MG/ML IV BOLUS
INTRAVENOUS | Status: DC | PRN
  Administered 2020-01-16 (×2): 10 mg via INTRAVENOUS

## 2020-01-16 MED ORDER — POVIDONE-IODINE 10 % EX SWAB
2.0000 "application " | Freq: Once | CUTANEOUS | Status: AC
  Administered 2020-01-16: 2 via TOPICAL

## 2020-01-16 MED ORDER — AMLODIPINE BESYLATE 10 MG PO TABS
10.0000 mg | ORAL_TABLET | Freq: Every day | ORAL | Status: DC
  Administered 2020-01-17 – 2020-01-18 (×2): 10 mg via ORAL
  Filled 2020-01-16 (×2): qty 1

## 2020-01-16 MED ORDER — MIDAZOLAM HCL 2 MG/2ML IJ SOLN
INTRAMUSCULAR | Status: DC | PRN
  Administered 2020-01-16 (×2): 1 mg via INTRAVENOUS

## 2020-01-16 MED ORDER — PHENYLEPHRINE HCL (PRESSORS) 10 MG/ML IV SOLN
INTRAVENOUS | Status: DC | PRN
  Administered 2020-01-16 (×2): 100 ug via INTRAVENOUS

## 2020-01-16 MED ORDER — VENLAFAXINE HCL ER 150 MG PO CP24
150.0000 mg | ORAL_CAPSULE | Freq: Every day | ORAL | Status: DC
  Administered 2020-01-17 – 2020-01-18 (×2): 150 mg via ORAL
  Filled 2020-01-16 (×2): qty 1

## 2020-01-16 MED ORDER — SODIUM CHLORIDE 0.9 % IR SOLN
Status: DC | PRN
  Administered 2020-01-16: 1000 mL

## 2020-01-16 MED ORDER — ACETAMINOPHEN 325 MG PO TABS: 325.0000 mg | ORAL_TABLET | Freq: Four times a day (QID) | ORAL | Status: DC | PRN

## 2020-01-16 MED ORDER — PROPOFOL 500 MG/50ML IV EMUL
INTRAVENOUS | Status: DC | PRN
  Administered 2020-01-16: 50 ug/kg/min via INTRAVENOUS

## 2020-01-16 MED ORDER — FENTANYL CITRATE (PF) 100 MCG/2ML IJ SOLN
INTRAMUSCULAR | Status: DC | PRN
  Administered 2020-01-16 (×2): 50 ug via INTRAVENOUS

## 2020-01-16 MED ORDER — METOCLOPRAMIDE HCL 5 MG/ML IJ SOLN: 5.0000 mg | Freq: Three times a day (TID) | INTRAMUSCULAR | Status: DC | PRN

## 2020-01-16 MED ORDER — PROMETHAZINE HCL 25 MG/ML IJ SOLN: 6.2500 mg | INTRAMUSCULAR | Status: DC | PRN

## 2020-01-16 MED ORDER — DIPHENHYDRAMINE HCL 12.5 MG/5ML PO ELIX: 12.5000 mg | ORAL_SOLUTION | ORAL | Status: DC | PRN

## 2020-01-16 MED ORDER — PANTOPRAZOLE SODIUM 40 MG PO TBEC
40.0000 mg | DELAYED_RELEASE_TABLET | Freq: Every day | ORAL | Status: DC
  Administered 2020-01-17 – 2020-01-18 (×2): 40 mg via ORAL
  Filled 2020-01-16 (×2): qty 1

## 2020-01-16 MED ORDER — ALUM & MAG HYDROXIDE-SIMETH 200-200-20 MG/5ML PO SUSP: 30.0000 mL | ORAL | Status: DC | PRN

## 2020-01-16 MED ORDER — METHOCARBAMOL 500 MG IVPB - SIMPLE MED
500.0000 mg | Freq: Four times a day (QID) | INTRAVENOUS | Status: DC | PRN
  Filled 2020-01-16: qty 50

## 2020-01-16 MED ORDER — FENTANYL CITRATE (PF) 100 MCG/2ML IJ SOLN
INTRAMUSCULAR | Status: AC
  Filled 2020-01-16: qty 2

## 2020-01-16 MED ORDER — POLYETHYLENE GLYCOL 3350 17 G PO PACK: 17.0000 g | PACK | Freq: Every day | ORAL | Status: DC | PRN

## 2020-01-16 MED ORDER — B COMPLEX-C PO TABS
1.0000 | ORAL_TABLET | Freq: Every day | ORAL | Status: DC
  Administered 2020-01-17 – 2020-01-18 (×2): 1 via ORAL
  Filled 2020-01-16 (×2): qty 1

## 2020-01-16 MED ORDER — TRANEXAMIC ACID-NACL 1000-0.7 MG/100ML-% IV SOLN
1000.0000 mg | INTRAVENOUS | Status: AC
  Administered 2020-01-16: 1000 mg via INTRAVENOUS
  Filled 2020-01-16: qty 100

## 2020-01-16 MED ORDER — ACETAMINOPHEN 500 MG PO TABS
1000.0000 mg | ORAL_TABLET | Freq: Once | ORAL | Status: AC
  Administered 2020-01-16: 1000 mg via ORAL
  Filled 2020-01-16: qty 2

## 2020-01-16 MED ORDER — DEXMEDETOMIDINE HCL IN NACL 200 MCG/50ML IV SOLN
INTRAVENOUS | Status: AC
  Filled 2020-01-16: qty 50

## 2020-01-16 MED ORDER — ASCORBIC ACID 500 MG PO TABS
500.0000 mg | ORAL_TABLET | Freq: Every day | ORAL | Status: DC
  Administered 2020-01-17 – 2020-01-18 (×2): 500 mg via ORAL
  Filled 2020-01-16 (×2): qty 1

## 2020-01-16 MED ORDER — DEXAMETHASONE SODIUM PHOSPHATE 10 MG/ML IJ SOLN
INTRAMUSCULAR | Status: DC | PRN
  Administered 2020-01-16: 8 mg via INTRAVENOUS

## 2020-01-16 MED ORDER — FENTANYL CITRATE (PF) 100 MCG/2ML IJ SOLN
25.0000 ug | INTRAMUSCULAR | Status: DC | PRN
  Administered 2020-01-16: 25 ug via INTRAVENOUS

## 2020-01-16 MED ORDER — ONDANSETRON HCL 4 MG PO TABS: 4.0000 mg | ORAL_TABLET | Freq: Four times a day (QID) | ORAL | Status: DC | PRN

## 2020-01-16 MED ORDER — OXYCODONE HCL 5 MG PO TABS
10.0000 mg | ORAL_TABLET | ORAL | Status: DC | PRN
  Administered 2020-01-16 – 2020-01-17 (×2): 10 mg via ORAL
  Filled 2020-01-16: qty 2

## 2020-01-16 MED ORDER — BUPIVACAINE IN DEXTROSE 0.75-8.25 % IT SOLN
INTRATHECAL | Status: DC | PRN
  Administered 2020-01-16: 1.6 mL via INTRATHECAL

## 2020-01-16 MED ORDER — HYDROMORPHONE HCL 1 MG/ML IJ SOLN: 0.5000 mg | INTRAMUSCULAR | Status: DC | PRN

## 2020-01-16 MED ORDER — SODIUM CHLORIDE 0.9 % IV SOLN: INTRAVENOUS | Status: DC

## 2020-01-16 MED ORDER — PHENYLEPHRINE HCL-NACL 10-0.9 MG/250ML-% IV SOLN
INTRAVENOUS | Status: DC | PRN
  Administered 2020-01-16: 10 ug/min via INTRAVENOUS

## 2020-01-16 MED ORDER — ALBUMIN HUMAN 5 % IV SOLN
INTRAVENOUS | Status: AC
  Administered 2020-01-16: 12.5 g via INTRAVENOUS
  Filled 2020-01-16: qty 250

## 2020-01-16 MED ORDER — ASPIRIN 81 MG PO CHEW
81.0000 mg | CHEWABLE_TABLET | Freq: Two times a day (BID) | ORAL | Status: DC
  Administered 2020-01-16 – 2020-01-18 (×4): 81 mg via ORAL
  Filled 2020-01-16 (×4): qty 1

## 2020-01-16 MED ORDER — PROPOFOL 10 MG/ML IV BOLUS
INTRAVENOUS | Status: AC
  Filled 2020-01-16: qty 20

## 2020-01-16 MED ORDER — ALBUTEROL SULFATE (2.5 MG/3ML) 0.083% IN NEBU: 2.5000 mg | INHALATION_SOLUTION | Freq: Four times a day (QID) | RESPIRATORY_TRACT | Status: DC | PRN

## 2020-01-16 MED ORDER — METHOCARBAMOL 500 MG PO TABS
500.0000 mg | ORAL_TABLET | Freq: Four times a day (QID) | ORAL | Status: DC | PRN
  Administered 2020-01-16 – 2020-01-18 (×4): 500 mg via ORAL
  Filled 2020-01-16 (×4): qty 1

## 2020-01-16 MED ORDER — METHOCARBAMOL 500 MG IVPB - SIMPLE MED
INTRAVENOUS | Status: AC
  Administered 2020-01-16: 500 mg via INTRAVENOUS
  Filled 2020-01-16: qty 50

## 2020-01-16 MED ORDER — OXYCODONE HCL 5 MG PO TABS
5.0000 mg | ORAL_TABLET | ORAL | Status: DC | PRN
  Administered 2020-01-16 (×2): 5 mg via ORAL
  Administered 2020-01-17 (×2): 10 mg via ORAL
  Administered 2020-01-17: 5 mg via ORAL
  Administered 2020-01-18 (×2): 10 mg via ORAL
  Filled 2020-01-16 (×4): qty 2
  Filled 2020-01-16 (×2): qty 1
  Filled 2020-01-16: qty 2
  Filled 2020-01-16: qty 1

## 2020-01-16 MED ORDER — DOCUSATE SODIUM 100 MG PO CAPS
100.0000 mg | ORAL_CAPSULE | Freq: Two times a day (BID) | ORAL | Status: DC
  Administered 2020-01-16 – 2020-01-18 (×4): 100 mg via ORAL
  Filled 2020-01-16 (×4): qty 1

## 2020-01-16 MED ORDER — ARIPIPRAZOLE 2 MG PO TABS
2.0000 mg | ORAL_TABLET | Freq: Every day | ORAL | Status: DC
  Administered 2020-01-17 – 2020-01-18 (×2): 2 mg via ORAL
  Filled 2020-01-16 (×2): qty 1

## 2020-01-16 MED ORDER — LACTATED RINGERS IV SOLN: INTRAVENOUS | Status: DC

## 2020-01-16 MED ORDER — MIDAZOLAM HCL 2 MG/2ML IJ SOLN
INTRAMUSCULAR | Status: AC
  Filled 2020-01-16: qty 2

## 2020-01-16 MED ORDER — PHENOL 1.4 % MT LIQD: 1.0000 | OROMUCOSAL | Status: DC | PRN

## 2020-01-16 MED ORDER — MENTHOL 3 MG MT LOZG: 1.0000 | LOZENGE | OROMUCOSAL | Status: DC | PRN

## 2020-01-16 MED ORDER — GABAPENTIN 100 MG PO CAPS
100.0000 mg | ORAL_CAPSULE | Freq: Three times a day (TID) | ORAL | Status: DC
  Administered 2020-01-16 – 2020-01-18 (×5): 100 mg via ORAL
  Filled 2020-01-16 (×5): qty 1

## 2020-01-16 SURGICAL SUPPLY — 42 items
BAG ZIPLOCK 12X15 (MISCELLANEOUS) IMPLANT
BENZOIN TINCTURE PRP APPL 2/3 (GAUZE/BANDAGES/DRESSINGS) IMPLANT
BLADE SAW SGTL 18X1.27X75 (BLADE) ×2 IMPLANT
BLADE SAW SGTL 18X1.27X75MM (BLADE) ×1
CLOSURE WOUND 1/2 X4 (GAUZE/BANDAGES/DRESSINGS)
COVER PERINEAL POST (MISCELLANEOUS) ×3 IMPLANT
COVER SURGICAL LIGHT HANDLE (MISCELLANEOUS) ×3 IMPLANT
COVER WAND RF STERILE (DRAPES) ×3 IMPLANT
CUP SECTOR GRIPTON 50MM (Cup) ×3 IMPLANT
DRAPE STERI IOBAN 125X83 (DRAPES) ×3 IMPLANT
DRAPE U-SHAPE 47X51 STRL (DRAPES) ×6 IMPLANT
DRESSING AQUACEL AG SP 3.5X6 (GAUZE/BANDAGES/DRESSINGS) ×1 IMPLANT
DRSG AQUACEL AG ADV 3.5X10 (GAUZE/BANDAGES/DRESSINGS) ×3 IMPLANT
DRSG AQUACEL AG SP 3.5X6 (GAUZE/BANDAGES/DRESSINGS) ×3
DURAPREP 26ML APPLICATOR (WOUND CARE) ×3 IMPLANT
ELECT REM PT RETURN 15FT ADLT (MISCELLANEOUS) ×3 IMPLANT
GAUZE XEROFORM 1X8 LF (GAUZE/BANDAGES/DRESSINGS) ×3 IMPLANT
GLOVE BIO SURGEON STRL SZ7.5 (GLOVE) ×3 IMPLANT
GLOVE BIOGEL PI IND STRL 8 (GLOVE) ×2 IMPLANT
GLOVE BIOGEL PI INDICATOR 8 (GLOVE) ×4
GLOVE ECLIPSE 8.0 STRL XLNG CF (GLOVE) ×3 IMPLANT
GOWN STRL REUS W/TWL XL LVL3 (GOWN DISPOSABLE) ×6 IMPLANT
HANDPIECE INTERPULSE COAX TIP (DISPOSABLE) ×2
HEAD FEM STD 32X+1 STRL (Hips) ×3 IMPLANT
HOLDER FOLEY CATH W/STRAP (MISCELLANEOUS) ×3 IMPLANT
KIT TURNOVER KIT A (KITS) IMPLANT
LINER ACETABULAR 32X50 (Liner) ×3 IMPLANT
PACK ANTERIOR HIP CUSTOM (KITS) ×3 IMPLANT
PENCIL SMOKE EVACUATOR (MISCELLANEOUS) IMPLANT
SET HNDPC FAN SPRY TIP SCT (DISPOSABLE) ×1 IMPLANT
STAPLER VISISTAT 35W (STAPLE) IMPLANT
STEM CORAIL KA12 (Stem) ×3 IMPLANT
STRIP CLOSURE SKIN 1/2X4 (GAUZE/BANDAGES/DRESSINGS) IMPLANT
SUT ETHIBOND NAB CT1 #1 30IN (SUTURE) ×3 IMPLANT
SUT ETHILON 2 0 PS N (SUTURE) IMPLANT
SUT MNCRL AB 4-0 PS2 18 (SUTURE) IMPLANT
SUT VIC AB 0 CT1 36 (SUTURE) ×3 IMPLANT
SUT VIC AB 1 CT1 36 (SUTURE) ×3 IMPLANT
SUT VIC AB 2-0 CT1 27 (SUTURE) ×4
SUT VIC AB 2-0 CT1 TAPERPNT 27 (SUTURE) ×2 IMPLANT
TRAY FOLEY MTR SLVR 16FR STAT (SET/KITS/TRAYS/PACK) IMPLANT
YANKAUER SUCT BULB TIP 10FT TU (MISCELLANEOUS) ×3 IMPLANT

## 2020-01-16 NOTE — Brief Op Note (Signed)
01/16/2020  12:44 PM  PATIENT:  Brenda Carney  69 y.o. female  PRE-OPERATIVE DIAGNOSIS:  Avascular necrosis right hip  POST-OPERATIVE DIAGNOSIS:  Avascular necrosis right hip  PROCEDURE:  Procedure(s): RIGHT TOTAL HIP ARTHROPLASTY ANTERIOR APPROACH (Right)  SURGEON:  Surgeon(s) and Role:    Mcarthur Rossetti, MD - Primary  PHYSICIAN ASSISTANT:  Benita Stabile, PA-C  ANESTHESIA:   spinal  EBL:  200 mL   COUNTS:  YES  DICTATION: .Other Dictation: Dictation Number [id number]  PLAN OF CARE: Admit for overnight observation  PATIENT DISPOSITION:  PACU - hemodynamically stable.   Delay start of Pharmacological VTE agent (>24hrs) due to surgical blood loss or risk of bleeding: no

## 2020-01-16 NOTE — Evaluation (Signed)
Physical Therapy Evaluation Patient Details Name: Brenda Carney MRN: [id number] DOB: Mar 24, 1951 Today's Date: 01/16/2020   History of Present Illness  Pt s/p R THR and with hx of CVA, PAD, CAD, COPD and lumbar fusion  Clinical Impression  Pt s/p R THR and presents with decreased R LE strength/ROM and post op pain limiting functional mobility.  Pt should progress to dc home with assist of friend and HHPT follow up.    Follow Up Recommendations Home health PT;Follow surgeon's recommendation for DC plan and follow-up therapies    Equipment Recommendations  Rolling walker with 5" wheels    Recommendations for Other Services       Precautions / Restrictions Precautions Precautions: Fall Restrictions Weight Bearing Restrictions: No Other Position/Activity Restrictions: WBAT      Mobility  Bed Mobility Overal bed mobility: Needs Assistance Bed Mobility: Supine to Sit     Supine to sit: Min assist     General bed mobility comments: increased time with cues for sequence and use of L LE to self assist  Transfers Overall transfer level: Needs assistance Equipment used: Rolling walker (2 wheeled) Transfers: Sit to/from Stand Sit to Stand: Min assist         General transfer comment: cues for LE management and use of UEs to self assist  Ambulation/Gait Ambulation/Gait assistance: Min assist Gait Distance (Feet): 35 Feet Assistive device: Rolling walker (2 wheeled) Gait Pattern/deviations: Step-to pattern;Decreased step length - right;Decreased step length - left;Shuffle;Trunk flexed Gait velocity: decr   General Gait Details: cues for sequence, posture, and position from [employer]            Wheelchair Mobility    Modified Rankin (Stroke Patients Only)       Balance Overall balance assessment: Needs assistance Sitting-balance support: No upper extremity supported;Feet supported Sitting balance-Leahy Scale: Fair     Standing balance support:  Bilateral upper extremity supported Standing balance-Leahy Scale: Poor                               Pertinent Vitals/Pain Pain Assessment: 0-10 Pain Score: 6  Pain Location: R hip Pain Descriptors / Indicators: Aching;Sore Pain Intervention(s): Limited activity within patient's tolerance;Monitored during session;Premedicated before session;Ice applied    Home Living Family/patient expects to be discharged to:: Private residence Living Arrangements: Alone Available Help at Discharge: Friend(s) Type of Home: House Home Access: Stairs to enter Entrance Stairs-Rails: None Entrance Stairs-Number of Steps: 2 Home Layout: Able to live on main level with bedroom/bathroom Home Equipment: Cane - single point;Bedside commode;Shower seat      Prior Function Level of Independence: Independent;Independent with assistive device(s)         Comments: used cane as needed     Hand Dominance   Dominant Hand: Right    Extremity/Trunk Assessment   Upper Extremity Assessment Upper Extremity Assessment: Overall WFL for tasks assessed    Lower Extremity Assessment Lower Extremity Assessment: RLE deficits/detail    Cervical / Trunk Assessment Cervical / Trunk Assessment: Normal  Communication   Communication: No difficulties  Cognition Arousal/Alertness: Awake/alert Behavior During Therapy: WFL for tasks assessed/performed Overall Cognitive Status: Within Functional Limits for tasks assessed                                        General Comments  Exercises     Assessment/Plan    PT Assessment Patient needs continued PT services  PT Problem List Decreased strength;Decreased range of motion;Decreased activity tolerance;Decreased balance;Decreased mobility;Decreased knowledge of use of DME;Pain       PT Treatment Interventions DME instruction;Gait training;Stair training;Functional mobility training;Therapeutic activities;Therapeutic  exercise;Patient/family education    PT Goals (Current goals can be found in the Care Plan section)  Acute Rehab PT Goals Patient Stated Goal: Regain IND PT Goal Formulation: With patient Time For Goal Achievement: 01/23/20 Potential to Achieve Goals: Good    Frequency 7X/week   Barriers to discharge        Co-evaluation               AM-PAC PT "6 Clicks" Mobility  Outcome Measure Help needed turning from your back to your side while in a flat bed without using bedrails?: A Little Help needed moving from lying on your back to sitting on the side of a flat bed without using bedrails?: A Little Help needed moving to and from a bed to a chair (including a wheelchair)?: A Little Help needed standing up from a chair using your arms (e.g., wheelchair or bedside chair)?: A Little Help needed to walk in hospital room?: A Little Help needed climbing 3-5 steps with a railing? : A Lot 6 Click Score: 17    End of Session Equipment Utilized During Treatment: Gait belt Activity Tolerance: Patient tolerated treatment well Patient left: in chair;with call bell/phone within reach Nurse Communication: Mobility status PT Visit Diagnosis: Difficulty in walking, not elsewhere classified (R26.2)    Time: 1750-1815 PT Time Calculation (min) (ACUTE ONLY): 25 min   Charges:   PT Evaluation $PT Eval Low Complexity: 1 Low PT Treatments $Gait Training: 8-22 mins        Natalia Pager [phone] Office [phone]   Valin Massie 01/16/2020, 6:30 PM

## 2020-01-16 NOTE — Anesthesia Procedure Notes (Signed)
Spinal  Patient location during procedure: OR Start time: 01/16/2020 11:40 AM End time: 01/16/2020 11:43 AM Staffing Performed: anesthesiologist  Anesthesiologist: Catalina Gravel, MD Preanesthetic Checklist Completed: patient identified, IV checked, risks and benefits discussed, surgical consent, monitors and equipment checked, pre-op evaluation and timeout performed Spinal Block Patient position: sitting Prep: DuraPrep and site prepped and draped Patient monitoring: continuous pulse ox and blood pressure Approach: midline Location: L3-4 Injection technique: single-shot Needle Needle type: Pencan  Needle gauge: 24 G Additional Notes Functioning IV was confirmed and monitors were applied. Sterile prep and drape, including hand hygiene, mask and sterile gloves were used. The patient was positioned and the spine was prepped. The skin was anesthetized with lidocaine.  Free flow of clear CSF was obtained prior to injecting local anesthetic into the CSF.  The spinal needle aspirated freely following injection.  The needle was carefully withdrawn.  The patient tolerated the procedure well. Consent was obtained prior to procedure with all questions answered and concerns addressed. Risks including but not limited to bleeding, infection, nerve damage, paralysis, failed block, inadequate analgesia, allergic reaction, high spinal, itching and headache were discussed and the patient wished to proceed.   Hoy Morn, MD

## 2020-01-16 NOTE — Care Plan (Signed)
Ortho Bundle Case Management Note  Patient Details  Name: Brenda Carney MRN: [id number] Date of Birth: September 10, 1951    RNCM call to patient to discuss her upcoming Right THA with Dr. Ninfa Linden on 01/16/20. Patient is an Ortho bundle patient through [facility] and reviewed this with her. Patient has a paid CG that will be helping her at home after a brief hospital stay. She has an elevated toilet seat, stool for the shower, and cane. She will need a FWW. This has been ordered through [facility]. Anticipate home health therapy will be needed after short hospital stay. Choice provided and referral made to [facility]. Will continue to follow for any further CM needs. Please contact Jamse Arn, RN, BSN, CCM for any questions or needs. [phone]               DME Arranged:  [place] Rile rolling DME Agency:  [employer]  HH Arranged:  PT [place] Agency:  [facility] (now [facility])  Additional Comments: Please contact me with any questions of if this plan should need to change.  Jamse Arn, RN, BSN, [employer]  [phone] 01/16/2020, 11:52 AM

## 2020-01-16 NOTE — Anesthesia Postprocedure Evaluation (Signed)
Anesthesia Post Note  Patient: Brenda Carney  Procedure(s) Performed: RIGHT TOTAL HIP ARTHROPLASTY ANTERIOR APPROACH (Right Hip)     Patient location during evaluation: PACU Anesthesia Type: Spinal Level of consciousness: oriented, awake and alert and awake Pain management: pain level controlled Vital Signs Assessment: post-procedure vital signs reviewed and stable Respiratory status: spontaneous breathing, respiratory function stable, patient connected to nasal cannula oxygen and nonlabored ventilation Cardiovascular status: blood pressure returned to baseline and stable Postop Assessment: no headache, no backache, no apparent nausea or vomiting, patient able to bend at knees and spinal receding Anesthetic complications: no    Last Vitals:  Vitals:   01/16/20 1415 01/16/20 1430  BP: 112/75 111/89  Pulse: 70 70  Resp: 15 19  Temp:  (!) 36.4 C  SpO2: 98% 100%    Last Pain:  Vitals:   01/16/20 1430  TempSrc:   PainSc: Asleep                 Catalina Gravel

## 2020-01-16 NOTE — Transfer of Care (Signed)
Immediate Anesthesia Transfer of Care Note  Patient: Brenda Carney  Procedure(s) Performed: RIGHT TOTAL HIP ARTHROPLASTY ANTERIOR APPROACH (Right Hip)  Patient Location: PACU  Anesthesia Type:MAC and Spinal  Level of Consciousness: awake, alert  and patient cooperative  Airway & Oxygen Therapy: Patient Spontanous Breathing and Patient connected to face mask oxygen  Post-op Assessment: Report given to RN and Post -op Vital signs reviewed and stable  Post vital signs: Reviewed and stable  Last Vitals:  Vitals Value Taken Time  BP 107/72 01/16/20 1332  Temp 36.4 C 01/16/20 1300  Pulse 73 01/16/20 1327  Resp 10 01/16/20 1332  SpO2 95 % 01/16/20 1327  Vitals shown include unvalidated device data.  Last Pain:  Vitals:   01/16/20 1300  TempSrc:   PainSc: 2       Patients Stated Pain Goal: 3 (59/97/74 1423)  Complications: No apparent anesthesia complications

## 2020-01-16 NOTE — Anesthesia Preprocedure Evaluation (Addendum)
Anesthesia Evaluation  Patient identified by MRN, date of birth, ID band Patient awake    Reviewed: Allergy & Precautions, NPO status , Patient's Chart, lab work & pertinent test results  Airway Mallampati: II  TM Distance: >3 FB Neck ROM: Full    Dental  (+) Teeth Intact, Dental Advisory Given, Implants   Pulmonary asthma , sleep apnea , COPD,  COPD inhaler, Patient abstained from smoking., former smoker,    Pulmonary exam normal breath sounds clear to auscultation       Cardiovascular hypertension, Pt. 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  Reproductive/Obstetrics                           Anesthesia Physical Anesthesia Plan  ASA: III  Anesthesia Plan: Spinal   Post-op Pain Management:    Induction: Intravenous  PONV Risk Score and Plan: 1 and Propofol infusion, Treatment may vary due to age or medical condition and Ondansetron  Airway Management Planned: Natural Airway and Nasal Cannula  Additional Equipment:   Intra-op Plan:   Post-operative Plan:   Informed Consent: I have reviewed the patients History and Physical, chart, labs and discussed the procedure including the risks, benefits and alternatives for the proposed anesthesia with the patient or authorized representative who has  indicated his/her understanding and acceptance.     Dental advisory given  Plan Discussed with: CRNA  Anesthesia Plan Comments:         Anesthesia Quick Evaluation

## 2020-01-16 NOTE — H&P (Signed)
TOTAL HIP ADMISSION H&P  Patient is admitted for right total hip arthroplasty.  Subjective:  Chief Complaint: right hip pain  HPI: Brenda Carney, 69 y.o. female, has a history of pain and functional disability in the right hip(s) due to avascular necrosis and patient has failed non-surgical conservative treatments for greater than 12 weeks to include NSAID's and/or analgesics, use of assistive devices and activity modification.  Onset of symptoms was abrupt starting 1 years ago with rapidlly worsening course since that time.The patient noted no past surgery on the right hip(s).  Patient currently rates pain in the right hip at 10 out of 10 with activity. Patient has night pain, worsening of pain with activity and weight bearing, trendelenberg gait, pain that interfers with activities of daily living and pain with passive range of motion. Patient has evidence of subchondral cysts and AVN with impending femoral head collapse by imaging studies. This condition presents safety issues increasing the risk of falls.  There is no current active infection.  Patient Active Problem List   Diagnosis Date Noted  . Avascular necrosis of bone of right hip ([place]) 01/06/2020  . CAP (community acquired pneumonia) 06/17/2019  . Leukocytosis 06/17/2019  . Hypoxia   . Sepsis ([place]) 04/29/2018  . Hematemesis 04/29/2018  . Hypokalemia 04/29/2018  . AKI (acute kidney injury) ([place]) 04/29/2018  . Acute respiratory failure with hypoxia ([place]) 04/29/2018  . Thoracic aortic ectasia ([place])   . Sleep apnea   . Pneumonia   . Migraine headache   . Lumbosacral spondylosis   . Hx of multiple concussions   . History of kidney stones   . History of IBS   . GERD (gastroesophageal reflux disease)   . Exercise-induced asthma with acute exacerbation   . Depression   . COPD with acute exacerbation ([place])   . Chronic lower back pain   . Cervical spondylosis   . Cerebral aneurysm   . Arthritis   . Anemia   . Coronary artery  disease, non-occlusive 06/28/2017  . Critical lower limb ischemia 05/14/2017  . Preoperative cardiovascular examination   . PAD (peripheral artery disease) ([place])   . S/P lumbar spinal fusion 09/30/2014  . Essential hypertension   . Hyperlipidemia with target LDL less than 70   . Cerebrovascular disease   . Headache(784.0) 10/07/2013  . TIA (transient ischemic attack) 09/05/2013  . Tobacco abuse 09/05/2013   Past Medical History:  Diagnosis Date  . Arthritis    "thumbs; hips" (05/14/2017)  . 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 Cerebrovascular disease    Carotid dopplers which showed no significant increase in velocities, extremely minimal on the left, antegrade vertebral bilaterally.  . Cervical spondylosis   . Chronic lower back pain   . COPD (chronic obstructive pulmonary disease) ([place])    "little bit" (05/14/2017)  . Coronary artery disease, non-occlusive    Coronary calcium scoring: June 2018: Score 1106. 98 percentile for age -White  . Depression   . Exercise-induced asthma with acute exacerbation    "only in very hot weather" (05/14/2017)  . GERD (gastroesophageal reflux disease)   . History of IBS    resovlved  . History of kidney stones   . 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 Hyperlipidemia with target LDL less than 70    Mostly statin intolerant,. Currently on Livalo 4 mg  . Hypertension, benign   .  Lumbosacral spondylosis   . PAD (peripheral artery disease) ([facility])    Status post right above-the-knee-below the knee popliteal artery bypass; postop right ABI 0.96.  Marland Kitchen Pneumonia 2020  . Sleep apnea    does not wear CPAP; "think it was medication related" (05/14/2017)  . Stroke [facility]) 2010   TIA  . Thoracic aortic ectasia ([facility])    ~40 mm on Coronary Calcium Score CT - recommend f/u CTA or MRA  . TIA (transient ischemic attack) 09/2011   "several"    Past  Surgical History:  Procedure Laterality Date  . ABDOMINAL AORTOGRAM W/LOWER EXTREMITY N/A 05/14/2017   Procedure: Abdominal Aortogram w/Lower Extremity;  Surgeon: Conrad Los Altos, MD;  Location: [facility];  Service: Cardiovascular;  Laterality: N/A;  . ACROMIO-CLAVICULAR JOINT REPAIR Left 1990s   "I think it was called an AC joint removal"  . BACK SURGERY    . BYPASS GRAFT POPLITEAL TO POPLITEAL Right 05/15/2017   Procedure: BYPASS GRAFT ABOVE KNEE POPLITEAL TO BELOW KNEE POPLITEAL ARTERY;  Surgeon: Conrad Monroe, MD;  [facility];  Service: Vascular;  Laterality: Right;  . ESOPHAGOGASTRODUODENOSCOPY (EGD) WITH PROPOFOL N/A 04/29/2018   Procedure: ESOPHAGOGASTRODUODENOSCOPY (EGD) WITH PROPOFOL;  Surgeon: Otis Brace, MD;  Location: [place];  Service: Gastroenterology;  Laterality: N/A;  . FOOT FRACTURE SURGERY Bilateral    multiple procedures  . FRACTURE SURGERY    . MAXIMUM ACCESS (MAS)POSTERIOR LUMBAR INTERBODY FUSION (PLIF) 1 LEVEL N/A 09/30/2014   Procedure: FOR MAXIMUM ACCESS (MAS) POSTERIOR LUMBAR INTERBODY FUSION (PLIF) 1 LEVEL;  Surgeon: Eustace Moore, MD;  Location: [facility];  Service: Neurosurgery;  Laterality: N/A;  FOR MAXIMUM ACCESS (MAS) POSTERIOR LUMBAR INTERBODY FUSION (PLIF) 1 LEVEL LUMBAR 4-5  . MULTIPLE TOOTH EXTRACTIONS Right 04/2017  . NM MYOVIEW LTD  06/2017    Normal EF, 66%. No ST changes. Normal study. LOW RISK.  Marland Kitchen TRANSTHORACIC ECHOCARDIOGRAM  09/07/2013   Done to evaluate TIA: Normal LV size and function. EF 55-60%. GR 1 DD. Mild left atrial dilation. Mildly calcified mitral leaflets. Otherwise normal.    No current facility-administered medications for this encounter.   Current Outpatient Medications  Medication Sig Dispense Refill Last Dose  . albuterol (PROVENTIL) (2.5 MG/3ML) 0.083% nebulizer solution Take 3 mLs (2.5 mg total) by nebulization 2 (two) times daily as needed for wheezing or shortness of breath. 75 mL 0   . albuterol  (VENTOLIN HFA) 108 (90 Base) MCG/ACT inhaler Inhale 2 puffs into the lungs every 4 (four) hours as needed for wheezing or shortness of breath.     . ALPRAZolam (XANAX) 1 MG tablet Take 1 mg by mouth at bedtime as needed for anxiety.     Marland Kitchen amLODipine (NORVASC) 10 MG tablet Take 1 tablet (10 mg total) by mouth daily. 30 tablet 0   . ANORO ELLIPTA 62.5-25 MCG/INH AEPB Inhale 2 puffs into the lungs daily as needed (wheezing).      . ARIPiprazole (ABILIFY) 2 MG tablet Take 2 mg by mouth daily.     Marland Kitchen ascorbic acid (VITAMIN C) 500 MG tablet Take 500 mg by mouth daily.     Marland Kitchen b complex vitamins tablet Take 1 tablet by mouth daily.     Marland Kitchen buPROPion (WELLBUTRIN XL) 150 MG 24 hr tablet Take 150 mg by mouth daily.  1   . Calcium Carb-Cholecalciferol (CALTRATE 600+D3 PO) Take 1 tablet by mouth daily before breakfast.     . desvenlafaxine (PRISTIQ) 100 MG 24 hr  tablet Take 100 mg by mouth daily.     . Estradiol 10 MCG TABS vaginal tablet Place 10 mcg vaginally 2 (two) times a week.     . Evolocumab (REPATHA SURECLICK) [id number] MG/ML SOAJ Inject 140 mg into the skin every 14 (fourteen) days. 2 mL 11   . Multiple Vitamins-Calcium (ONE-A-DAY WOMENS FORMULA) TABS Take 1 tablet by mouth daily with breakfast.     . pantoprazole (PROTONIX) 40 MG tablet Take 1 tablet (40 mg total) by mouth 2 (two) times daily. (Patient taking differently: Take 40 mg by mouth daily before breakfast. ) 60 tablet 0   . Propylene Glycol-Glycerin (SOOTHE OP) Place 1 drop into both eyes 3 (three) times daily as needed (for dry eyes).      Marland Kitchen zolpidem (AMBIEN) 10 MG tablet Take 5-15 mg by mouth See admin instructions. Take 15 mg by mouth at bedtime and an additional 5 mg if still unable to sleep after an hour     . budesonide-formoterol (SYMBICORT) 160-4.5 MCG/ACT inhaler Inhale 2 puffs into the lungs 2 (two) times daily. (Patient not taking: Reported on 01/09/2020) 1 Inhaler 12 Not Taking at Unknown time  . methocarbamol (ROBAXIN) 750 MG tablet Take 1  tablet (750 mg total) by mouth 3 (three) times daily as needed for muscle spasms. (Patient not taking: Reported on 01/09/2020) 20 tablet 0 Not Taking at Unknown time  . tiotropium (SPIRIVA HANDIHALER) 18 MCG inhalation capsule Place 1 capsule (18 mcg total) into inhaler and inhale daily. 30 capsule 2    Allergies  Allergen Reactions  . Penicillins Swelling and Other (See Comments)    TONGUE SWELLING  PATIENT HAD A PCN REACTION WITH IMMEDIATE RASH, FACIAL/TONGUE/THROAT SWELLING, SOB, OR LIGHTHEADEDNESS WITH HYPOTENSION:  #  #  #  YES  #  #  #   Has patient had a PCN reaction causing severe rash involving mucus membranes or skin necrosis: No Has patient had a PCN reaction that required hospitalization: No Has patient had a PCN reaction occurring within the last 10 years: No   . Statins     muscle pain  . Lyrica [Pregabalin] Other (See Comments)    DRY MOUTH    Social History   Tobacco Use  . Smoking status: Heavy Tobacco Smoker    Packs/day: 0.02    Years: 36.00    Pack years: 0.72    Types: Cigarettes    Last attempt to quit: 04/24/2017    Years since quitting: 2.7  . Smokeless tobacco: Never Used  Substance Use Topics  . Alcohol use: Yes    Alcohol/week: 3.0 standard drinks    Types: 3 Glasses of wine per week    Family History  Problem Relation Age of Onset  . Stroke Father   . Heart attack Mother   . Stroke Mother      Review of Systems  Musculoskeletal: Positive for joint swelling.  All other systems reviewed and are negative.   Objective:  Physical Exam  Constitutional: She is oriented to person, place, and time. She appears well-developed and well-nourished.  HENT:  Head: Normocephalic and atraumatic.  Eyes: Pupils are equal, round, and reactive to light. EOM are normal.  Cardiovascular: Normal rate.  Respiratory: Effort normal.  GI: Soft.  Musculoskeletal:     Cervical back: Normal range of motion and neck supple.     Right hip: Tenderness and bony  tenderness present. Decreased range of motion. Decreased strength.  Neurological: She is alert and oriented to  person, place, and time.  Skin: Skin is warm and dry.  Psychiatric: She has a normal mood and affect.    Vital signs in last 24 hours: Temp:  [98.6 F (37 C)] 98.6 F (37 C) (01/21 1335) Pulse Rate:  [97] 97 (01/21 1335) Resp:  [16] 16 (01/21 1335) BP: (115)/(94) 115/94 (01/21 1335) SpO2:  [98 %] 98 % (01/21 1335) Weight:  [68 kg] 68 kg (01/21 1335)  Labs:   Estimated body mass index is 24.96 kg/m as calculated from the following:   Height as of 01/15/20: 5\' 5"  (1.651 m).   Weight as of 01/15/20: 68 kg.   Imaging Review Plain radiographs demonstrate severe avascular necrosis of the right hip(s). The bone quality appears to be good for age and reported activity level.      Assessment/Plan:  End AVN, right hip(s)  The patient history, physical examination, clinical judgement of the provider and imaging studies are consistent with end stage AVN of the right hip(s) and total hip arthroplasty is deemed medically necessary. The treatment options including medical management, injection therapy, arthroscopy and arthroplasty were discussed at length. The risks and benefits of total hip arthroplasty were presented and reviewed. The risks due to aseptic loosening, infection, stiffness, dislocation/subluxation,  thromboembolic complications and other imponderables were discussed.  The patient acknowledged the explanation, agreed to proceed with the plan and consent was signed. Patient is being admitted for inpatient treatment for surgery, pain control, PT, OT, prophylactic antibiotics, VTE prophylaxis, progressive ambulation and ADL's and discharge planning.The patient is planning to be discharged home with home health services

## 2020-01-17 LAB — BASIC METABOLIC PANEL
Anion gap: 10 (ref 5–15)
BUN: 9 mg/dL (ref 8–23)
CO2: 23 mmol/L (ref 22–32)
Calcium: 8.6 mg/dL — ABNORMAL LOW (ref 8.9–10.3)
Chloride: 105 mmol/L (ref 98–111)
Creatinine, Ser: 0.68 mg/dL (ref 0.44–1.00)
GFR calc Af Amer: >60 mL/min (ref >60)
GFR calc non Af Amer: >60 mL/min (ref >60)
Glucose, Bld: 111 mg/dL — ABNORMAL HIGH (ref 70–99)
Potassium: 3.2 mmol/L — ABNORMAL LOW (ref 3.5–5.1)
Sodium: 138 mmol/L (ref 135–145)

## 2020-01-17 LAB — CBC
HCT: 38.5 % (ref 36.0–46.0)
Hemoglobin: 13.3 g/dL (ref 12.0–15.0)
MCH: 33.7 pg (ref 26.0–34.0)
MCHC: 34.5 g/dL (ref 30.0–36.0)
MCV: 97.5 fL (ref 80.0–100.0)
Platelets: 381 10*3/uL (ref 150–400)
RBC: 3.95 MIL/uL (ref 3.87–5.11)
RDW: 12.3 % (ref 11.5–15.5)
WBC: 12.8 10*3/uL — ABNORMAL HIGH (ref 4.0–10.5)
nRBC: 0 % (ref 0.0–0.2)

## 2020-01-17 MED ORDER — ASPIRIN 81 MG PO CHEW: 81.0000 mg | CHEWABLE_TABLET | Freq: Two times a day (BID) | ORAL | 0 refills | Status: DC

## 2020-01-17 MED ORDER — OXYCODONE HCL 5 MG PO TABS: 5.0000 mg | ORAL_TABLET | ORAL | 0 refills | Status: DC | PRN

## 2020-01-17 MED ORDER — METHOCARBAMOL 500 MG PO TABS: 500.0000 mg | ORAL_TABLET | Freq: Four times a day (QID) | ORAL | 0 refills | Status: DC | PRN

## 2020-01-17 NOTE — Progress Notes (Signed)
Physical Therapy Treatment Patient Details Name: Brenda Carney MRN: [id number] DOB: 1951/05/01 Today's Date: 01/17/2020    History of Present Illness Pt s/p R THR and with hx of CVA, PAD, CAD, COPD and lumbar fusion    PT Comments    Pt motivated and progressing with mobility but pain ltd this am 7/10 - RN aware.   Follow Up Recommendations  Home health PT;Follow surgeon's recommendation for DC plan and follow-up therapies     Equipment Recommendations  Rolling walker with 5" wheels    Recommendations for Other Services       Precautions / Restrictions Precautions Precautions: Fall Restrictions Weight Bearing Restrictions: No Other Position/Activity Restrictions: WBAT    Mobility  Bed Mobility Overal bed mobility: Needs Assistance Bed Mobility: Supine to Sit     Supine to sit: Min guard     General bed mobility comments: cues for sequence, pt self assisting R LE with L LE  Transfers Overall transfer level: Needs assistance Equipment used: Rolling walker (2 wheeled) Transfers: Sit to/from Stand Sit to Stand: Min guard         General transfer comment: cues for LE management and use of UEs to self assist  Ambulation/Gait Ambulation/Gait assistance: Min assist;Min guard Gait Distance (Feet): 58 Feet Assistive device: Rolling walker (2 wheeled) Gait Pattern/deviations: Step-to pattern;Decreased step length - right;Decreased step length - left;Shuffle;Trunk flexed Gait velocity: decr   General Gait Details: cues for sequence, posture, and position from [employer]             Wheelchair Mobility    Modified Rankin (Stroke Patients Only)       Balance Overall balance assessment: Needs assistance Sitting-balance support: No upper extremity supported;Feet supported Sitting balance-Leahy Scale: Fair     Standing balance support: Bilateral upper extremity supported Standing balance-Leahy Scale: Poor                               Cognition Arousal/Alertness: Awake/alert Behavior During Therapy: WFL for tasks assessed/performed Overall Cognitive Status: Within Functional Limits for tasks assessed                                        Exercises Total Joint Exercises Ankle Circles/Pumps: AROM;Both;15 reps;Supine Quad Sets: AROM;Both;10 reps;Supine Heel Slides: AAROM;Right;15 reps;Supine Hip ABduction/ADduction: AAROM;Right;15 reps;Supine    General Comments        Pertinent Vitals/Pain Pain Assessment: 0-10 Pain Score: 7  Pain Location: R hip Pain Descriptors / Indicators: Aching;Sore Pain Intervention(s): Monitored during session;Limited activity within patient's tolerance;Premedicated before session;Ice applied    Home Living                      Prior Function            PT Goals (current goals can now be found in the care plan section) Acute Rehab PT Goals Patient Stated Goal: Regain IND PT Goal Formulation: With patient Time For Goal Achievement: 01/23/20 Potential to Achieve Goals: Good Progress towards PT goals: Progressing toward goals    Frequency    7X/week      PT Plan Current plan remains appropriate    Co-evaluation              AM-PAC PT "6 Clicks" Mobility   Outcome Measure  Help needed turning from  your back to your side while in a flat bed without using bedrails?: A Little Help needed moving from lying on your back to sitting on the side of a flat bed without using bedrails?: A Little Help needed moving to and from a bed to a chair (including a wheelchair)?: A Little Help needed standing up from a chair using your arms (e.g., wheelchair or bedside chair)?: A Little Help needed to walk in hospital room?: A Little Help needed climbing 3-5 steps with a railing? : A Lot 6 Click Score: 17    End of Session Equipment Utilized During Treatment: Gait belt Activity Tolerance: Patient tolerated treatment well Patient left: in chair;with call  bell/phone within reach Nurse Communication: Mobility status PT Visit Diagnosis: Difficulty in walking, not elsewhere classified (R26.2)     Time: [id number] PT Time Calculation (min) (ACUTE ONLY): 29 min  Charges:  $Gait Training: 8-22 mins $Therapeutic Exercise: 8-22 mins                     [place] Pager [phone] Office [phone]    Rose Hippler 01/17/2020, 11:25 AM

## 2020-01-17 NOTE — Discharge Instructions (Signed)

## 2020-01-17 NOTE — Plan of Care (Signed)
  Problem: Clinical Measurements: Goal: Respiratory complications will improve Outcome: Progressing   Problem: Clinical Measurements: Goal: Cardiovascular complication will be avoided Outcome: Progressing   Problem: Activity: Goal: Risk for activity intolerance will decrease Outcome: Progressing   Problem: Nutrition: Goal: Adequate nutrition will be maintained Outcome: Progressing   Problem: Coping: Goal: Level of anxiety will decrease Outcome: Progressing   Problem: Elimination: Goal: Will not experience complications related to bowel motility Outcome: Progressing   

## 2020-01-17 NOTE — Op Note (Signed)
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  PROCEDURE:  Right total hip arthroplasty through direct anterior approach.  IMPLANTS:  DePuy Sector Gription acetabular component size zero, size 32+0 neutral polyethylene liner, size 12 Corail femoral component with standard offset, size 32+1 metal hip ball.  SURGEON:  Lind Guest. Ninfa Linden, MD  ASSISTANT:  Erskine Emery, PA-C  ANESTHESIA:  Spinal.  ANTIBIOTICS:  900 mg IV clindamycin.  ESTIMATED BLOOD LOSS:  200 mL.  COMPLICATIONS:  None.  INDICATIONS:  The patient is a 70 year old female, acute onset of right hip pain several months ago.  An MRI was obtained that showed avascular necrosis of both hips, worse on the right than left.  The right hip has impending femoral collapse.  She has  been ambulating with a cane and taking it easy on her hip.  Her hip pain is daily and it is severe.  It is detrimentally affecting her activities of daily living, her quality of life, and her mobility.  We have recommended total hip arthroplasty needing  to be done acutely due to the impending femoral head collapse.  I showed her a hip model in the office and explained in detail what the surgery involves.  We talked about the risk of acute blood loss anemia, nerve or vessel injury, fracture, infection,  dislocation, DVT and implant failure.  We talked about our goals being decreased pain, improve mobility and overall improve quality of life.  DESCRIPTION OF PROCEDURE:  After informed consent was obtained and appropriate right hip was marked she was brought to the operating room and sat up on a  stretcher where spinal anesthesia was then obtained.  She was then laid in supine position on a  stretcher.  I assessed her leg lengths and found them to be equal.  Foley catheter was placed.  I placed traction boots on both her feet.  Next, she was placed supine on the Hana fracture table, the perineal post in place and both legs in line skeletal  traction device and no traction applied.  Her right operative hip was then prepped and draped with DuraPrep and sterile drapes.  A timeout was called and she was identified as correct patient, correct right hip.  I then made an incision just inferior and  posterior to the anterior superior iliac spine and carried this obliquely down the leg.  We dissected down tensor fascia lata muscle.  Tensor fascia was then divided longitudinally to proceed with direct anterior approach to the hip.  We identified and  cauterized circumflex vessels and identified the hip capsule, opened the hip capsule in an L-type format finding very large joint effusion.  I placed a Cobra retractor around the medial and lateral femoral neck, using an oscillating saw to make our  femoral neck cut just proximal to the lesser trochanter and I completed this with an osteotome.  We placed a corkscrew guide in the femoral head and removed the femoral head in its entirety and there was a large area of flaking off consistent with her  end-stage avascular necrosis.  I then placed a bent Hohmann over the medial acetabular rim and removed remnants of the acetabular labrum and other debris.  We began reaming under direct  visualization from a size 43 reamer in stepwise increments up to a  size 49 with all reamers placed under direct visualization.  The last reamer was placed under direct fluoroscopy, so I could obtain my depth of reaming, my inclination, and anteversion.  I then placed the real DePuy Sector Gription acetabular component  size 50 and a 32+0 neutral polyethylene liner based on her offset and  placement of the cup.  Attention was then turned to the femur.  With the leg externally rotated to 120 degrees, extended and adducted, we replaced Mueller retractor medially and Hohman  retractor behind the greater trochanter, released lateral joint capsule and used a box-cutting osteotome to enter the femoral canal and a rongeur to lateralize then began broaching using the Corail broaching system from a size 8 going up to a size 12.   With a size 12 in place, we trialed a standard offset femoral neck and a 32+1 trial hip ball.  We reduced this in the pelvis, and assessed mechanically and radiographically and I was pleased with leg length, offset, range of motion and stability.  We  then dislocated the hip and removed the trial components.  I placed the real Corail femoral component size 12 and the real 32+1 metal hip ball again reduced this in the acetabulum and I was pleased with leg length, offset, range of motion and stability  assessed mechanically and radiographically.  I then irrigated the soft tissue with normal saline solution using pulsatile lavage.  We were able to reapproximate the joint capsule with #1 Ethibond suture.  #1 Vicryl was used to close the tensor fascia, 0  Vicryl was used to close the subcutaneous tissue, and we did place interrupted staples on the skin closure.  Xeroform and Aquacel dressing were applied.  She was taken off the Hana table and taken to recovery room in stable condition.  All final counts  were correct.  No complications noted.  Of note, Benita Stabile, PA-C did assist during the entirety of the case.  His assistance was crucial for facilitating all aspects of this case.  CN/NUANCE  D:01/16/2020 T:01/17/2020 JOB:[id number]

## 2020-01-17 NOTE — Progress Notes (Signed)
Physical Therapy Treatment Patient Details Name: Brenda Carney MRN: [id number] DOB: 1951-12-17 Today's Date: 01/17/2020    History of Present Illness Pt s/p R THR and with hx of CVA, PAD, CAD, COPD and lumbar fusion    PT Comments    Pt continues very cooperative and progressing steadily with mobility.  Pt hopeful for dc home tomorrow.   Follow Up Recommendations  Home health PT;Follow surgeon's recommendation for DC plan and follow-up therapies     Equipment Recommendations  Rolling walker with 5" wheels    Recommendations for Other Services       Precautions / Restrictions Precautions Precautions: Fall Restrictions Weight Bearing Restrictions: No Other Position/Activity Restrictions: WBAT    Mobility  Bed Mobility Overal bed mobility: Needs Assistance Bed Mobility: Supine to Sit     Supine to sit: Supervision     General bed mobility comments: cues for sequence, pt self assisting R LE with L LE  Transfers Overall transfer level: Needs assistance Equipment used: Rolling walker (2 wheeled) Transfers: Sit to/from Stand Sit to Stand: Min guard         General transfer comment: cues for LE management and use of UEs to self assist  Ambulation/Gait Ambulation/Gait assistance: Min guard Gait Distance (Feet): 95 Feet Assistive device: Rolling walker (2 wheeled) Gait Pattern/deviations: Step-to pattern;Decreased step length - right;Decreased step length - left;Shuffle;Trunk flexed Gait velocity: decr   General Gait Details: cues for sequence, posture, ER on R, to increase BOS and position from [employer] Mobility    Modified Rankin (Stroke Patients Only)       Balance Overall balance assessment: Needs assistance Sitting-balance support: No upper extremity supported;Feet supported Sitting balance-Leahy Scale: Fair     Standing balance support: Bilateral upper extremity supported Standing balance-Leahy Scale: Poor                               Cognition Arousal/Alertness: Awake/alert Behavior During Therapy: WFL for tasks assessed/performed Overall Cognitive Status: Within Functional Limits for tasks assessed                                        Exercises      General Comments        Pertinent Vitals/Pain Pain Assessment: 0-10 Pain Score: 6  Pain Location: R hip Pain Descriptors / Indicators: Aching;Sore Pain Intervention(s): Limited activity within patient's tolerance;Monitored during session;Premedicated before session;Ice applied    Home Living                      Prior Function            PT Goals (current goals can now be found in the care plan section) Acute Rehab PT Goals Patient Stated Goal: Regain IND PT Goal Formulation: With patient Time For Goal Achievement: 01/23/20 Potential to Achieve Goals: Good Progress towards PT goals: Progressing toward goals    Frequency    7X/week      PT Plan Current plan remains appropriate    Co-evaluation              AM-PAC PT "6 Clicks" Mobility   Outcome Measure  Help needed turning from your back to your side while in a flat bed without using  bedrails?: A Little Help needed moving from lying on your back to sitting on the side of a flat bed without using bedrails?: A Little Help needed moving to and from a bed to a chair (including a wheelchair)?: A Little Help needed standing up from a chair using your arms (e.g., wheelchair or bedside chair)?: A Little Help needed to walk in hospital room?: A Little Help needed climbing 3-5 steps with a railing? : A Lot 6 Click Score: 17    End of Session Equipment Utilized During Treatment: Gait belt Activity Tolerance: Patient tolerated treatment well Patient left: in bed;with call bell/phone within reach;with bed alarm set Nurse Communication: Mobility status PT Visit Diagnosis: Difficulty in walking, not elsewhere classified (R26.2)      Time: [id number] PT Time Calculation (min) (ACUTE ONLY): 23 min  Charges:  $Gait Training: 23-37 mins                     [place] Pager [phone] Office [phone]    Lewie Deman 01/17/2020, 4:53 PM

## 2020-01-17 NOTE — Progress Notes (Signed)
Subjective: 1 Day Post-Op Procedure(s) (LRB): RIGHT TOTAL HIP ARTHROPLASTY ANTERIOR APPROACH (Right) Patient reports pain as moderate.    Objective: Vital signs in last 24 hours: Temp:  [97.5 F (36.4 C)-98.4 F (36.9 C)] 98.4 F (36.9 C) (01/23 0556) Pulse Rate:  [69-105] 95 (01/23 0556) Resp:  [13-19] 17 (01/23 0556) BP: (84-150)/(61-90) 150/88 (01/23 0556) SpO2:  [90 %-100 %] 93 % (01/23 0556)  Intake/Output from previous day: 01/22 0701 - 01/23 0700 In: 3850.4 [P.O.:840; I.V.:2660.4; IV Piggyback:350] Out: [id number] [Urine:3450; Blood:200] Intake/Output this shift: Total I/O In: 603.9 [P.O.:600; I.V.:3.9] Out: 500 [Urine:500]  Recent Labs    01/15/20 1116 01/17/20 0427  HGB 15.5* 13.3   Recent Labs    01/15/20 1116 01/17/20 0427  WBC 11.7* 12.8*  RBC 4.57 3.95  HCT 44.5 38.5  PLT 423* 381   Recent Labs    01/15/20 1116 01/17/20 0427  NA 137 138  K 3.2* 3.2*  CL 102 105  CO2 24 23  BUN 11 9  CREATININE 0.88 0.68  GLUCOSE 98 111*  CALCIUM 9.3 8.6*   No results for input(s): LABPT, INR in the last 72 hours.  Sensation intact distally Intact pulses distally Dorsiflexion/Plantar flexion intact Incision: scant drainage   Assessment/Plan: 1 Day Post-Op Procedure(s) (LRB): RIGHT TOTAL HIP ARTHROPLASTY ANTERIOR APPROACH (Right) Up with therapy Discharge home with home health this afternoon vs tomorrow depending on her progress with therapy.      Mcarthur Rossetti 01/17/2020, 11:10 AM

## 2020-01-18 DIAGNOSIS — Z7951 Long term (current) use of inhaled steroids: Secondary | ICD-10-CM | POA: Diagnosis not present

## 2020-01-18 DIAGNOSIS — D649 Anemia, unspecified: Secondary | ICD-10-CM | POA: Diagnosis present

## 2020-01-18 DIAGNOSIS — G8929 Other chronic pain: Secondary | ICD-10-CM | POA: Diagnosis present

## 2020-01-18 DIAGNOSIS — K219 Gastro-esophageal reflux disease without esophagitis: Secondary | ICD-10-CM | POA: Diagnosis present

## 2020-01-18 DIAGNOSIS — M47817 Spondylosis without myelopathy or radiculopathy, lumbosacral region: Secondary | ICD-10-CM | POA: Diagnosis present

## 2020-01-18 DIAGNOSIS — I2510 Atherosclerotic heart disease of native coronary artery without angina pectoris: Secondary | ICD-10-CM | POA: Diagnosis present

## 2020-01-18 DIAGNOSIS — F329 Major depressive disorder, single episode, unspecified: Secondary | ICD-10-CM | POA: Diagnosis present

## 2020-01-18 DIAGNOSIS — Z888 Allergy status to other drugs, medicaments and biological substances status: Secondary | ICD-10-CM | POA: Diagnosis not present

## 2020-01-18 DIAGNOSIS — I77810 Thoracic aortic ectasia: Secondary | ICD-10-CM | POA: Diagnosis present

## 2020-01-18 DIAGNOSIS — Z8619 Personal history of other infectious and parasitic diseases: Secondary | ICD-10-CM | POA: Diagnosis not present

## 2020-01-18 DIAGNOSIS — I10 Essential (primary) hypertension: Secondary | ICD-10-CM | POA: Diagnosis present

## 2020-01-18 DIAGNOSIS — F17210 Nicotine dependence, cigarettes, uncomplicated: Secondary | ICD-10-CM | POA: Diagnosis present

## 2020-01-18 DIAGNOSIS — I739 Peripheral vascular disease, unspecified: Secondary | ICD-10-CM | POA: Diagnosis present

## 2020-01-18 DIAGNOSIS — M47812 Spondylosis without myelopathy or radiculopathy, cervical region: Secondary | ICD-10-CM | POA: Diagnosis present

## 2020-01-18 DIAGNOSIS — Z79899 Other long term (current) drug therapy: Secondary | ICD-10-CM | POA: Diagnosis not present

## 2020-01-18 DIAGNOSIS — G4730 Sleep apnea, unspecified: Secondary | ICD-10-CM | POA: Diagnosis present

## 2020-01-18 DIAGNOSIS — Z8673 Personal history of transient ischemic attack (TIA), and cerebral infarction without residual deficits: Secondary | ICD-10-CM | POA: Diagnosis not present

## 2020-01-18 DIAGNOSIS — E785 Hyperlipidemia, unspecified: Secondary | ICD-10-CM | POA: Diagnosis present

## 2020-01-18 DIAGNOSIS — Z20822 Contact with and (suspected) exposure to covid-19: Secondary | ICD-10-CM | POA: Diagnosis present

## 2020-01-18 DIAGNOSIS — J449 Chronic obstructive pulmonary disease, unspecified: Secondary | ICD-10-CM | POA: Diagnosis present

## 2020-01-18 DIAGNOSIS — Z880 Allergy status to penicillin: Secondary | ICD-10-CM | POA: Diagnosis not present

## 2020-01-18 DIAGNOSIS — M1990 Unspecified osteoarthritis, unspecified site: Secondary | ICD-10-CM | POA: Diagnosis present

## 2020-01-18 DIAGNOSIS — I671 Cerebral aneurysm, nonruptured: Secondary | ICD-10-CM | POA: Diagnosis present

## 2020-01-18 DIAGNOSIS — M879 Osteonecrosis, unspecified: Secondary | ICD-10-CM | POA: Diagnosis present

## 2020-01-18 NOTE — TOC Progression Note (Signed)
Transition of Care [facility]) - Progression Note    Patient Details  Name: Brenda Carney MRN: [id number] Date of Birth: 16-Sep-1951  [facility] of [facility]) CM/SW Contact  Joaquin Courts, RN Phone Number: 01/18/2020, 11:30 AM  Clinical Narrative:    [employer] to deliver rolling walker to bedside for home use.    Expected Discharge Plan: [place] Barriers to Discharge: No Barriers Identified  Expected Discharge Plan and Services Expected Discharge Plan: [place]   Discharge Planning Services: CM Consult Post Acute Care Choice: [place] arrangements for the past 2 months: Single Family Home                 DME Arranged: Walker rolling DME Agency: [employer] Date DME Agency Contacted: 01/18/20 Time DME Agency Contacted: 1129 Representative spoke with at DME Agency: [place]: PT [place]: [facility] (now [facility])         Social Determinants of Health ([place]) Interventions    Readmission Risk Interventions No flowsheet data found.

## 2020-01-18 NOTE — Progress Notes (Signed)
Physical Therapy Treatment Patient Details Name: Brenda Carney MRN: [id number] DOB: 04-05-1951 Today's Date: 01/18/2020    History of Present Illness Pt s/p R THR and with hx of CVA, PAD, CAD, COPD and lumbar fusion    PT Comments    Pt continues to progress very well. Ready for d/c from PT standpoint. Pt reports she has hired assist for a few days at home and will have [place] at d/c. RN aware of readiness  Follow Up Recommendations  Home health PT;Follow surgeon's recommendation for DC plan and follow-up therapies     Equipment Recommendations  Rolling walker with 5" wheels    Recommendations for Other Services       Precautions / Restrictions Precautions Precautions: Fall Restrictions Weight Bearing Restrictions: No Other Position/Activity Restrictions: WBAT    Mobility  Bed Mobility Overal bed mobility: Needs Assistance Bed Mobility: Supine to Sit;Sit to Supine     Supine to sit: Supervision Sit to supine: Supervision;Min guard   General bed mobility comments: cues for sequence, pt self assisting R LE with L LE or gait belt as leg lifter  Transfers Overall transfer level: Needs assistance Equipment used: Rolling walker (2 wheeled) Transfers: Sit to/from Stand Sit to Stand: Supervision         General transfer comment: cues for LE management and use of UEs to self assist  Ambulation/Gait Ambulation/Gait assistance: Min guard;Supervision Gait Distance (Feet): 100 Feet Assistive device: Rolling walker (2 wheeled) Gait Pattern/deviations: Step-to pattern;Trunk flexed;Decreased stance time - left Gait velocity: decr   General Gait Details: cues for sequence, posture,  and position from RW   Stairs Stairs: Yes Stairs assistance: Min assist Stair Management: No rails;Step to pattern;Backwards;With walker Number of Stairs: 2 General stair comments: cues for sequence and technique   Wheelchair Mobility    Modified Rankin (Stroke Patients Only)        Balance Overall balance assessment: Needs assistance Sitting-balance support: No upper extremity supported;Feet supported Sitting balance-Leahy Scale: Fair     Standing balance support: Bilateral upper extremity supported Standing balance-Leahy Scale: Poor                              Cognition Arousal/Alertness: Awake/alert Behavior During Therapy: WFL for tasks assessed/performed Overall Cognitive Status: Within Functional Limits for tasks assessed                                        Exercises Total Joint Exercises Ankle Circles/Pumps: AROM;Both;15 reps;Supine Quad Sets: AROM;Both;10 reps;Supine Heel Slides: AAROM;Right;10 reps Hip ABduction/ADduction: AAROM;Right;10 reps    General Comments        Pertinent Vitals/Pain Pain Assessment: 0-10 Pain Score: 3  Pain Location: R hip Pain Descriptors / Indicators: Aching;Sore Pain Intervention(s): Limited activity within patient's tolerance;Monitored during session;Premedicated before session;Ice applied    Home Living                      Prior Function            PT Goals (current goals can now be found in the care plan section) Acute Rehab PT Goals Patient Stated Goal: Regain IND PT Goal Formulation: With patient Time For Goal Achievement: 01/23/20 Potential to Achieve Goals: Good Progress towards PT goals: Progressing toward goals    Frequency    7X/week  PT Plan Current plan remains appropriate    Co-evaluation              AM-PAC PT "6 Clicks" Mobility   Outcome Measure  Help needed turning from your back to your side while in a flat bed without using bedrails?: A Little Help needed moving from lying on your back to sitting on the side of a flat bed without using bedrails?: A Little Help needed moving to and from a bed to a chair (including a wheelchair)?: A Little Help needed standing up from a chair using your arms (e.g., wheelchair or  bedside chair)?: A Little Help needed to walk in hospital room?: A Little Help needed climbing 3-5 steps with a railing? : A Little 6 Click Score: 18    End of Session Equipment Utilized During Treatment: Gait belt Activity Tolerance: Patient tolerated treatment well Patient left: in bed;with call bell/phone within reach;with bed alarm set Nurse Communication: Mobility status PT Visit Diagnosis: Difficulty in walking, not elsewhere classified (R26.2)     Time: [id number] PT Time Calculation (min) (ACUTE ONLY): 31 min  Charges:  $Gait Training: 8-22 mins $Therapeutic Exercise: 8-22 mins                     Baxter Flattery, PT   Acute Rehab Dept [facility]): [id number]   01/18/2020    [facility] 01/18/2020, 10:26 AM

## 2020-01-18 NOTE — Progress Notes (Signed)
Dr Louanne Skye aware via phone need official dc order. Verbal order received.

## 2020-01-18 NOTE — Plan of Care (Signed)
Pt awaiting DME to be delivered prior to dc.

## 2020-01-18 NOTE — Progress Notes (Signed)
Assessment unchanged. Pt verbalized understanding of dc instructions through teach back including meds and follow-up care. Discharged via wc to front entrance to meet ride. Accompanied by NT.

## 2020-01-18 NOTE — Progress Notes (Signed)
     Subjective: 2 Days Post-Op Procedure(s) (LRB): RIGHT TOTAL HIP ARTHROPLASTY ANTERIOR APPROACH (Right) Awake, alert and oriented x 4. No complaints. I'm ready to go home.   Patient reports pain as moderate.    Objective:   VITALS:  Temp:  [97.8 F (36.6 C)-100.3 F (37.9 C)] 100.3 F (37.9 C) (01/24 0451) Pulse Rate:  [87-106] 106 (01/24 0451) Resp:  [15-17] 17 (01/24 0451) BP: (105-145)/(91-92) 105/91 (01/24 0451) SpO2:  [89 %-96 %] 91 % (01/24 0451)  Neurologically intact ABD soft Neurovascular intact Sensation intact distally Intact pulses distally Dorsiflexion/Plantar flexion intact Incision: dressing C/D/I and no drainage   LABS Recent Labs    01/15/20 1116 01/17/20 0427  HGB 15.5* 13.3  WBC 11.7* 12.8*  PLT 423* 381   Recent Labs    01/15/20 1116 01/17/20 0427  NA 137 138  K 3.2* 3.2*  CL 102 105  CO2 24 23  BUN 11 9  CREATININE 0.88 0.68  GLUCOSE 98 111*   No results for input(s): LABPT, INR in the last 72 hours.   Assessment/Plan: 2 Days Post-Op Procedure(s) (LRB): RIGHT TOTAL HIP ARTHROPLASTY ANTERIOR APPROACH (Right)  Advance diet Up with therapy Discharge home with home health  Brenda Carney 01/18/2020, 10:26 AM

## 2020-01-19 ENCOUNTER — Encounter: Payer: Self-pay | Admitting: *Deleted

## 2020-01-19 ENCOUNTER — Telehealth: Payer: Self-pay | Admitting: *Deleted

## 2020-01-19 DIAGNOSIS — M47817 Spondylosis without myelopathy or radiculopathy, lumbosacral region: Secondary | ICD-10-CM | POA: Diagnosis not present

## 2020-01-19 DIAGNOSIS — Z7982 Long term (current) use of aspirin: Secondary | ICD-10-CM | POA: Diagnosis not present

## 2020-01-19 DIAGNOSIS — K219 Gastro-esophageal reflux disease without esophagitis: Secondary | ICD-10-CM | POA: Diagnosis not present

## 2020-01-19 DIAGNOSIS — G43909 Migraine, unspecified, not intractable, without status migrainosus: Secondary | ICD-10-CM | POA: Diagnosis not present

## 2020-01-19 DIAGNOSIS — J449 Chronic obstructive pulmonary disease, unspecified: Secondary | ICD-10-CM | POA: Diagnosis not present

## 2020-01-19 DIAGNOSIS — Z87442 Personal history of urinary calculi: Secondary | ICD-10-CM | POA: Diagnosis not present

## 2020-01-19 DIAGNOSIS — J45990 Exercise induced bronchospasm: Secondary | ICD-10-CM | POA: Diagnosis not present

## 2020-01-19 DIAGNOSIS — D649 Anemia, unspecified: Secondary | ICD-10-CM | POA: Diagnosis not present

## 2020-01-19 DIAGNOSIS — Z96641 Presence of right artificial hip joint: Secondary | ICD-10-CM | POA: Diagnosis not present

## 2020-01-19 DIAGNOSIS — I77810 Thoracic aortic ectasia: Secondary | ICD-10-CM | POA: Diagnosis not present

## 2020-01-19 DIAGNOSIS — Z7951 Long term (current) use of inhaled steroids: Secondary | ICD-10-CM | POA: Diagnosis not present

## 2020-01-19 DIAGNOSIS — I2510 Atherosclerotic heart disease of native coronary artery without angina pectoris: Secondary | ICD-10-CM | POA: Diagnosis not present

## 2020-01-19 DIAGNOSIS — G8929 Other chronic pain: Secondary | ICD-10-CM | POA: Diagnosis not present

## 2020-01-19 DIAGNOSIS — G4730 Sleep apnea, unspecified: Secondary | ICD-10-CM | POA: Diagnosis not present

## 2020-01-19 DIAGNOSIS — Z471 Aftercare following joint replacement surgery: Secondary | ICD-10-CM | POA: Diagnosis not present

## 2020-01-19 DIAGNOSIS — E785 Hyperlipidemia, unspecified: Secondary | ICD-10-CM | POA: Diagnosis not present

## 2020-01-19 DIAGNOSIS — Z8701 Personal history of pneumonia (recurrent): Secondary | ICD-10-CM | POA: Diagnosis not present

## 2020-01-19 DIAGNOSIS — Z981 Arthrodesis status: Secondary | ICD-10-CM | POA: Diagnosis not present

## 2020-01-19 DIAGNOSIS — Z8673 Personal history of transient ischemic attack (TIA), and cerebral infarction without residual deficits: Secondary | ICD-10-CM | POA: Diagnosis not present

## 2020-01-19 DIAGNOSIS — K589 Irritable bowel syndrome without diarrhea: Secondary | ICD-10-CM | POA: Diagnosis not present

## 2020-01-19 DIAGNOSIS — Z87891 Personal history of nicotine dependence: Secondary | ICD-10-CM | POA: Diagnosis not present

## 2020-01-19 DIAGNOSIS — F329 Major depressive disorder, single episode, unspecified: Secondary | ICD-10-CM | POA: Diagnosis not present

## 2020-01-19 DIAGNOSIS — M47812 Spondylosis without myelopathy or radiculopathy, cervical region: Secondary | ICD-10-CM | POA: Diagnosis not present

## 2020-01-19 DIAGNOSIS — I739 Peripheral vascular disease, unspecified: Secondary | ICD-10-CM | POA: Diagnosis not present

## 2020-01-19 DIAGNOSIS — I10 Essential (primary) hypertension: Secondary | ICD-10-CM | POA: Diagnosis not present

## 2020-01-19 NOTE — Telephone Encounter (Signed)
Ortho bundle D/C call completed. 

## 2020-01-21 ENCOUNTER — Telehealth: Payer: Self-pay

## 2020-01-21 DIAGNOSIS — J45990 Exercise induced bronchospasm: Secondary | ICD-10-CM | POA: Diagnosis not present

## 2020-01-21 DIAGNOSIS — Z471 Aftercare following joint replacement surgery: Secondary | ICD-10-CM | POA: Diagnosis not present

## 2020-01-21 DIAGNOSIS — I2510 Atherosclerotic heart disease of native coronary artery without angina pectoris: Secondary | ICD-10-CM | POA: Diagnosis not present

## 2020-01-21 DIAGNOSIS — I10 Essential (primary) hypertension: Secondary | ICD-10-CM | POA: Diagnosis not present

## 2020-01-21 DIAGNOSIS — I739 Peripheral vascular disease, unspecified: Secondary | ICD-10-CM | POA: Diagnosis not present

## 2020-01-21 DIAGNOSIS — J449 Chronic obstructive pulmonary disease, unspecified: Secondary | ICD-10-CM | POA: Diagnosis not present

## 2020-01-21 NOTE — Telephone Encounter (Signed)
FYI

## 2020-01-21 NOTE — Telephone Encounter (Signed)
FYI-  Patient wanted to let Dr. Ninfa Linden know that she had a temperature yesterday of 101.7 and that she was slightly congested, but now her temperature is normal.  Stated that incision looks good. Cb# [phone]. Please advise.

## 2020-01-23 ENCOUNTER — Telehealth: Payer: Self-pay | Admitting: *Deleted

## 2020-01-23 ENCOUNTER — Ambulatory Visit: Payer: Medicare Other

## 2020-01-23 NOTE — Telephone Encounter (Signed)
7 day Ortho bundle call completed. 

## 2020-01-26 DIAGNOSIS — J45990 Exercise induced bronchospasm: Secondary | ICD-10-CM | POA: Diagnosis not present

## 2020-01-26 DIAGNOSIS — Z471 Aftercare following joint replacement surgery: Secondary | ICD-10-CM | POA: Diagnosis not present

## 2020-01-26 DIAGNOSIS — I10 Essential (primary) hypertension: Secondary | ICD-10-CM | POA: Diagnosis not present

## 2020-01-26 DIAGNOSIS — I739 Peripheral vascular disease, unspecified: Secondary | ICD-10-CM | POA: Diagnosis not present

## 2020-01-26 DIAGNOSIS — I2510 Atherosclerotic heart disease of native coronary artery without angina pectoris: Secondary | ICD-10-CM | POA: Diagnosis not present

## 2020-01-26 DIAGNOSIS — J449 Chronic obstructive pulmonary disease, unspecified: Secondary | ICD-10-CM | POA: Diagnosis not present

## 2020-01-27 NOTE — Discharge Summary (Signed)
Patient ID: Brenda Carney MRN: [id number] DOB/AGE: June 16, 1951 69 y.o.  Admit date: 01/16/2020 Discharge date: 01/27/2020  Admission Diagnoses:  Principal Problem:   Avascular necrosis of bone of right hip [facility]) Active Problems:   Status post total replacement of right hip   Discharge Diagnoses:  Same  Past Medical History:  Diagnosis Date  . Arthritis    "thumbs; hips" (05/14/2017)  . 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 Cerebrovascular disease    Carotid dopplers which showed no significant increase in velocities, extremely minimal on the left, antegrade vertebral bilaterally.  . Cervical spondylosis   . Chronic lower back pain   . COPD (chronic obstructive pulmonary disease) ([place])    "little bit" (05/14/2017)  . Coronary artery disease, non-occlusive    Coronary calcium scoring: June 2018: Score 1106. 98 percentile for age -[place]  . Depression   . Exercise-induced asthma with acute exacerbation    "only in very hot weather" (05/14/2017)  . GERD (gastroesophageal reflux disease)   . History of IBS    resovlved  . History of kidney stones   . 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 Hyperlipidemia with target LDL less than 70    Mostly statin intolerant,. Currently on Livalo 4 mg  . Hypertension, benign   . Lumbosacral spondylosis   . PAD (peripheral artery disease) (HCC)    Status post right above-the-knee-below the knee popliteal artery bypass; postop right ABI 0.96.  Marland Kitchen Pneumonia 2020  . Sleep apnea    does not wear CPAP; "think it was medication related" (05/14/2017)  . Stroke [facility]) 2010   TIA  . Thoracic aortic ectasia (HCC)    ~40 mm on Coronary Calcium Score CT - recommend f/u CTA or MRA  . TIA (transient ischemic attack) 09/2011   "several"    Surgeries: Procedure(s): RIGHT TOTAL HIP ARTHROPLASTY ANTERIOR APPROACH on 01/16/2020   Consultants:    Discharged Condition: Improved  Hospital Course: Brenda Carney is an 69 y.o. female who was admitted 01/16/2020 for operative treatment ofAvascular necrosis of bone of right hip ([place]). Patient has severe unremitting pain that affects sleep, daily activities, and work/hobbies. After pre-op clearance the patient was taken to the operating room on 01/16/2020 and underwent  Procedure(s): RIGHT TOTAL HIP ARTHROPLASTY ANTERIOR APPROACH.    Patient was given perioperative antibiotics:  Anti-infectives (From admission, onward)   Start     Dose/Rate Route Frequency Ordered Stop   01/16/20 1800  clindamycin (CLEOCIN) IVPB 600 mg     600 mg 100 mL/hr over 30 Minutes Intravenous Every 6 hours 01/16/20 1534 01/16/20 2349   01/16/20 0915  clindamycin (CLEOCIN) IVPB 900 mg     900 mg 100 mL/hr over 30 Minutes Intravenous On call to O.R. 01/16/20 0903 01/16/20 1236       Patient was given sequential compression devices, early ambulation, and chemoprophylaxis to prevent DVT.  Patient benefited maximally from hospital stay and there were no complications.    Recent vital signs: No data found.   Recent laboratory studies: No results for input(s): WBC, HGB, HCT, PLT, NA, K, CL, CO2, BUN, CREATININE, GLUCOSE, INR, CALCIUM in the last 72 hours.  Invalid input(s): PT, 2   Discharge Medications:   Allergies as of 01/18/2020      Reactions   Penicillins Swelling, Other (See Comments)   TONGUE SWELLING PATIENT HAD A PCN REACTION  WITH IMMEDIATE RASH, FACIAL/TONGUE/THROAT SWELLING, SOB, OR LIGHTHEADEDNESS WITH HYPOTENSION:  #  #  #  YES  #  #  #   Has patient had a PCN reaction causing severe rash involving mucus membranes or skin necrosis: No Has patient had a PCN reaction that required hospitalization: No Has patient had a PCN reaction occurring within the last 10 years: No   Statins    muscle pain   Lyrica [pregabalin] Other (See Comments)   DRY MOUTH      Medication List    TAKE these  medications   ALPRAZolam 1 MG tablet Commonly known as: XANAX Take 1 mg by mouth at bedtime as needed for anxiety.   amLODipine 10 MG tablet Commonly known as: NORVASC Take 1 tablet (10 mg total) by mouth daily.   Anoro Ellipta 62.5-25 MCG/INH Aepb Generic drug: umeclidinium-vilanterol Inhale 2 puffs into the lungs daily as needed (wheezing).   ARIPiprazole 2 MG tablet Commonly known as: ABILIFY Take 2 mg by mouth daily.   ascorbic acid 500 MG tablet Commonly known as: VITAMIN C Take 500 mg by mouth daily.   aspirin 81 MG chewable tablet Chew 1 tablet (81 mg total) by mouth 2 (two) times daily.   b complex vitamins tablet Take 1 tablet by mouth daily.   budesonide-formoterol 160-4.5 MCG/ACT inhaler Commonly known as: Symbicort Inhale 2 puffs into the lungs 2 (two) times daily.   buPROPion 150 MG 24 hr tablet Commonly known as: WELLBUTRIN XL Take 150 mg by mouth daily.   CALTRATE 600+D3 PO Take 1 tablet by mouth daily before breakfast.   desvenlafaxine 100 MG 24 hr tablet Commonly known as: PRISTIQ Take 100 mg by mouth daily.   Estradiol 10 MCG Tabs vaginal tablet Place 10 mcg vaginally 2 (two) times a week.   methocarbamol 750 MG tablet Commonly known as: ROBAXIN Take 1 tablet (750 mg total) by mouth 3 (three) times daily as needed for muscle spasms. What changed: Another medication with the same name was added. Make sure you understand how and when to take each.   methocarbamol 500 MG tablet Commonly known as: ROBAXIN Take 1 tablet (500 mg total) by mouth every 6 (six) hours as needed for muscle spasms. What changed: You were already taking a medication with the same name, and this prescription was added. Make sure you understand how and when to take each.   One-A-Day Womens Formula Tabs Take 1 tablet by mouth daily with breakfast.   oxyCODONE 5 MG immediate release tablet Commonly known as: Oxy IR/ROXICODONE Take 1-2 tablets (5-10 mg total) by mouth every  4 (four) hours as needed for moderate pain (pain score 4-6).   pantoprazole 40 MG tablet Commonly known as: PROTONIX Take 1 tablet (40 mg total) by mouth 2 (two) times daily. What changed: when to take this   Repatha SureClick [id number] MG/ML [place] Generic drug: Evolocumab Inject 140 mg into the skin every 14 (fourteen) days.   SOOTHE OP Place 1 drop into both eyes 3 (three) times daily as needed (for dry eyes).   tiotropium 18 MCG inhalation capsule Commonly known as: Spiriva HandiHaler Place 1 capsule (18 mcg total) into inhaler and inhale daily.   Ventolin HFA 108 (90 Base) MCG/ACT inhaler Generic drug: albuterol Inhale 2 puffs into the lungs every 4 (four) hours as needed for wheezing or shortness of breath.   albuterol (2.5 MG/3ML) 0.083% nebulizer solution Commonly known as: PROVENTIL Take 3 mLs (2.5 mg total) by nebulization 2 (  two) times daily as needed for wheezing or shortness of breath.   zolpidem 10 MG tablet Commonly known as: AMBIEN Take 5-15 mg by mouth See admin instructions. Take 15 mg by mouth at bedtime and an additional 5 mg if still unable to sleep after an hour       Diagnostic Studies: DG Pelvis Portable  Result Date: 01/16/2020 CLINICAL DATA:  69 year old female with EXAM: PORTABLE PELVIS 1-2 VIEWS COMPARISON:  12/22/2019 FINDINGS: Interval right hip arthroplasty. Gas in the surgical bed with surgical staples in the soft tissues. Components are congruent. No fracture identified. Mild degenerative changes of the left hip. Vascular calcifications IMPRESSION: Early postoperative changes of right hip arthroplasty. Atherosclerosis Electronically Signed   By: Corrie Mckusick D.O.   On: 01/16/2020 13:48   MR Hip Right w/o contrast  Result Date: 01/03/2020 CLINICAL DATA:  Right hip pain, difficulty weight-bearing and decreased range of motion for 6 weeks. No known injury. EXAM: MR OF THE RIGHT HIP WITHOUT CONTRAST TECHNIQUE: Multiplanar, multisequence MR imaging was  performed. No intravenous contrast was administered. COMPARISON:  Plain films of the right hip 12/22/2019. FINDINGS: Bones: The patient has extensive avascular necrosis of the femoral heads bilaterally, worse on the right, where there is secondary intense marrow edema in the femoral head and neck. The anterior, superior aspect of the articular surface of the right femoral head is flattened. No edema about the left hip is identified. Postoperative change lower lumbar spine noted. SI joints and symphysis pubis are unremarkable. Articular cartilage and labrum Articular cartilage: Marked cartilage thinning is seen in the right hip. Labrum: There is degenerative tearing of the anterior and superior right labrum Joint or bursal effusion Joint effusion: Moderate right hip joint effusion and synovitis. No left effusion. Bursae: Negative. Muscles and tendons Muscles and tendons:  Intact. Other findings Miscellaneous: Imaged intrapelvic contents demonstrate postoperative change of hysterectomy. A few sigmoid diverticula are noted. IMPRESSION: Right much worse than left avascular necrosis of the femoral heads. There is intense marrow edema in the right femoral head and neck consistent with secondary stress change, a right hip joint effusion and synovitis and right hip osteoarthritis with tearing of the anterior and superior labrum. Electronically Signed   By: Inge Rise M.D.   On: 01/03/2020 11:44   DG C-Arm 1-60 Min-No Report  Result Date: 01/16/2020 Fluoroscopy was utilized by the requesting physician.  No radiographic interpretation.   DG HIP OPERATIVE UNILAT WITH PELVIS RIGHT  Result Date: 01/16/2020 CLINICAL DATA:  Total right hip replacement. EXAM: OPERATIVE RIGHT HIP (WITH PELVIS IF PERFORMED) 4 VIEWS TECHNIQUE: Fluoroscopic spot image(s) were submitted for interpretation post-operatively. COMPARISON:  MRI 01/03/2020.  Right hip 12/22/2019. FINDINGS: Total right hip replacement with anatomic alignment.  Hardware intact. Peripheral vascular calcification. IMPRESSION: Total right replacement with anatomic alignment. Electronically Signed   By: Marcello Moores  Register   On: 01/16/2020 13:00    Disposition: Discharge disposition: 01-Home or Self Care         Follow-up Information    Mcarthur Rossetti, MD. Go on 01/29/2020.   Specialty: Orthopedic Surgery Why: at 1:15 pm for your post-op appointment with Dr. Ninfa Linden. Contact information: [place] [address] [phone]        [facility] Follow up.   Specialty: Home Health Services Why: You have been approved for 5 home health physical therapy visits. Someone from the agency will be in contact with you after discharge to arrange your first home health therapy visit. Contact  information: [address] [place] [address] [phone]            Signed: Mcarthur Rossetti 01/27/2020, 11:52 AM

## 2020-01-28 DIAGNOSIS — J449 Chronic obstructive pulmonary disease, unspecified: Secondary | ICD-10-CM | POA: Diagnosis not present

## 2020-01-28 DIAGNOSIS — I739 Peripheral vascular disease, unspecified: Secondary | ICD-10-CM | POA: Diagnosis not present

## 2020-01-28 DIAGNOSIS — I10 Essential (primary) hypertension: Secondary | ICD-10-CM | POA: Diagnosis not present

## 2020-01-28 DIAGNOSIS — I2510 Atherosclerotic heart disease of native coronary artery without angina pectoris: Secondary | ICD-10-CM | POA: Diagnosis not present

## 2020-01-28 DIAGNOSIS — J45990 Exercise induced bronchospasm: Secondary | ICD-10-CM | POA: Diagnosis not present

## 2020-01-28 DIAGNOSIS — Z471 Aftercare following joint replacement surgery: Secondary | ICD-10-CM | POA: Diagnosis not present

## 2020-01-29 ENCOUNTER — Ambulatory Visit (INDEPENDENT_AMBULATORY_CARE_PROVIDER_SITE_OTHER): Payer: Medicare Other | Admitting: Orthopaedic Surgery

## 2020-01-29 ENCOUNTER — Other Ambulatory Visit: Payer: Self-pay

## 2020-01-29 ENCOUNTER — Telehealth: Payer: Self-pay | Admitting: *Deleted

## 2020-01-29 ENCOUNTER — Encounter: Payer: Self-pay | Admitting: Orthopaedic Surgery

## 2020-01-29 DIAGNOSIS — Z96641 Presence of right artificial hip joint: Secondary | ICD-10-CM

## 2020-01-29 NOTE — Progress Notes (Signed)
The patient is 13 days status post a right total hip arthroplasty.  This was to treat avascular necrosis.  She does have AVN of the left hip.  She understands that she will likely need to have this replaced in the near future.  She says overall she is doing much better than what she was before surgery.  She says her range of motion and strength have improved.  Home therapy is working with her.  She is already not taking any pain medication.  She has been on aspirin twice a day.  On exam she does have a moderate seroma site did drain about 40 cc of fluid off of her hip incision.  The staples look great so remove the staples in place Steri-Strips.  Overall her leg lengths appear near equal.  She is able to move both hips well.  Her right hip hurts a little bit her left hip does hurt with rotation with known AVN.  She will continue to be slow with her activities and mobility.  She will continue her cane.  I would like to see her back in 4 weeks to see how she is doing overall from a mobility standpoint.  At that point we can start talking about when to proceed with a left total hip arthroplasty.

## 2020-01-29 NOTE — Care Plan (Signed)
RNCM met with patient in office for her 2 week post-op visit with Dr. Ninfa Linden. She is ambulating with a cane and reports she is doing well and pleased with results after surgery so far. She has completed 4/5 of her HHPT visits and will have 1 last visit and then be done. She is aware of how to contact CM with any questions or needs. Will continue to follow along for any CM needs.

## 2020-01-29 NOTE — Telephone Encounter (Signed)
14 day Ortho bundle in person visit completed.

## 2020-01-30 ENCOUNTER — Ambulatory Visit: Payer: Medicare Other | Attending: Internal Medicine

## 2020-01-30 DIAGNOSIS — Z23 Encounter for immunization: Secondary | ICD-10-CM | POA: Insufficient documentation

## 2020-01-30 NOTE — Progress Notes (Signed)
   Covid-19 Vaccination Clinic  Name:  Brenda Carney    MRN: [id number] DOB: [birth year]  01/30/2020  Brenda Carney was observed post Covid-19 immunization for 15 minutes without incidence. She was provided with Vaccine Information Sheet and instruction to access the V-Safe system.   Brenda Carney was instructed to call 911 with any severe reactions post vaccine: Marland Kitchen Difficulty breathing  . Swelling of your face and throat  . A fast heartbeat  . A bad rash all over your body  . Dizziness and weakness    Immunizations Administered    Name Date Dose VIS Date Route   Pfizer COVID-19 Vaccine 01/30/2020  9:26 AM 0.3 mL 12/05/2019 Intramuscular   Manufacturer: [place]   Lot: [id number]   [place]: [id number]

## 2020-02-02 DIAGNOSIS — I739 Peripheral vascular disease, unspecified: Secondary | ICD-10-CM | POA: Diagnosis not present

## 2020-02-02 DIAGNOSIS — J449 Chronic obstructive pulmonary disease, unspecified: Secondary | ICD-10-CM | POA: Diagnosis not present

## 2020-02-02 DIAGNOSIS — Z471 Aftercare following joint replacement surgery: Secondary | ICD-10-CM | POA: Diagnosis not present

## 2020-02-02 DIAGNOSIS — J45990 Exercise induced bronchospasm: Secondary | ICD-10-CM | POA: Diagnosis not present

## 2020-02-02 DIAGNOSIS — I2510 Atherosclerotic heart disease of native coronary artery without angina pectoris: Secondary | ICD-10-CM | POA: Diagnosis not present

## 2020-02-02 DIAGNOSIS — I10 Essential (primary) hypertension: Secondary | ICD-10-CM | POA: Diagnosis not present

## 2020-02-03 ENCOUNTER — Ambulatory Visit: Payer: Medicare Other

## 2020-02-10 DIAGNOSIS — G4709 Other insomnia: Secondary | ICD-10-CM | POA: Diagnosis not present

## 2020-02-10 DIAGNOSIS — F341 Dysthymic disorder: Secondary | ICD-10-CM | POA: Diagnosis not present

## 2020-02-10 DIAGNOSIS — G4730 Sleep apnea, unspecified: Secondary | ICD-10-CM | POA: Diagnosis not present

## 2020-02-10 DIAGNOSIS — F4312 Post-traumatic stress disorder, chronic: Secondary | ICD-10-CM | POA: Diagnosis not present

## 2020-02-16 ENCOUNTER — Telehealth: Payer: Self-pay | Admitting: *Deleted

## 2020-02-16 DIAGNOSIS — I2510 Atherosclerotic heart disease of native coronary artery without angina pectoris: Secondary | ICD-10-CM

## 2020-02-16 DIAGNOSIS — E785 Hyperlipidemia, unspecified: Secondary | ICD-10-CM

## 2020-02-16 DIAGNOSIS — I739 Peripheral vascular disease, unspecified: Secondary | ICD-10-CM

## 2020-02-16 NOTE — Telephone Encounter (Signed)
Lab slip  And letter mailed  cmp , lipid

## 2020-02-16 NOTE — Telephone Encounter (Addendum)
-----   Message from Raiford Simmonds, RN sent at 08/26/2019  3:44 PM EDT ----- RECHECK LIPIDS , CMP  IN 6 MONTH  WILL NEED TO DO ORDER THAT TIME  FEB 2021  WILL MAIL LETTER IN @[name]  [id number] or Feb

## 2020-02-17 ENCOUNTER — Telehealth: Payer: Self-pay | Admitting: *Deleted

## 2020-02-17 NOTE — Telephone Encounter (Signed)
30 day Ortho bundle call completed.

## 2020-02-18 DIAGNOSIS — M5137 Other intervertebral disc degeneration, lumbosacral region: Secondary | ICD-10-CM | POA: Diagnosis not present

## 2020-02-18 DIAGNOSIS — M961 Postlaminectomy syndrome, not elsewhere classified: Secondary | ICD-10-CM | POA: Diagnosis not present

## 2020-02-18 DIAGNOSIS — M533 Sacrococcygeal disorders, not elsewhere classified: Secondary | ICD-10-CM | POA: Diagnosis not present

## 2020-02-18 DIAGNOSIS — Z471 Aftercare following joint replacement surgery: Secondary | ICD-10-CM | POA: Diagnosis not present

## 2020-02-18 DIAGNOSIS — G894 Chronic pain syndrome: Secondary | ICD-10-CM | POA: Diagnosis not present

## 2020-02-24 ENCOUNTER — Ambulatory Visit: Payer: Medicare Other | Attending: Internal Medicine

## 2020-02-24 DIAGNOSIS — L821 Other seborrheic keratosis: Secondary | ICD-10-CM | POA: Diagnosis not present

## 2020-02-24 DIAGNOSIS — Z23 Encounter for immunization: Secondary | ICD-10-CM | POA: Insufficient documentation

## 2020-02-24 DIAGNOSIS — L638 Other alopecia areata: Secondary | ICD-10-CM | POA: Diagnosis not present

## 2020-02-24 NOTE — Progress Notes (Signed)
   Covid-19 Vaccination Clinic  Name:  SELINA PALU    MRN: [id number] DOB: [birth year]  02/24/2020  Ms. Goosby was observed post Covid-19 immunization for 15 minutes without incident. She was provided with Vaccine Information Sheet and instruction to access the V-Safe system.   Ms. Washer was instructed to call 911 with any severe reactions post vaccine: Marland Kitchen Difficulty breathing  . Swelling of face and throat  . A fast heartbeat  . A bad rash all over body  . Dizziness and weakness   Immunizations Administered    Name Date Dose VIS Date Route   Pfizer COVID-19 Vaccine 02/24/2020  9:24 AM 0.3 mL 12/05/2019 Intramuscular   Manufacturer: [place]   Lot: [id number]   [place]: [id number]

## 2020-02-26 ENCOUNTER — Other Ambulatory Visit: Payer: Self-pay

## 2020-02-26 ENCOUNTER — Encounter: Payer: Self-pay | Admitting: Orthopaedic Surgery

## 2020-02-26 ENCOUNTER — Ambulatory Visit (INDEPENDENT_AMBULATORY_CARE_PROVIDER_SITE_OTHER): Payer: Medicare Other | Admitting: Orthopaedic Surgery

## 2020-02-26 ENCOUNTER — Ambulatory Visit (INDEPENDENT_AMBULATORY_CARE_PROVIDER_SITE_OTHER): Payer: Medicare Other

## 2020-02-26 DIAGNOSIS — Z96641 Presence of right artificial hip joint: Secondary | ICD-10-CM | POA: Diagnosis not present

## 2020-02-26 DIAGNOSIS — M25551 Pain in right hip: Secondary | ICD-10-CM

## 2020-02-26 NOTE — Progress Notes (Signed)
The patient is now almost 6 weeks status post a right total hip arthroplasty.  She says she keeps having a sensation like the hip keeps coming out of place.  On exam when I have her lay in a supine position there is a ligament discrepancy but her right operative side is actually much longer than the left.  When I put her through internal X rotation and shock the hip it does not feel unstable and I cannot elicit any signs or symptoms of the hip subluxating but she feels like that is the case several times a day.  It does not come out of place formally.  X-rays of the pelvis and right hip show a well-seated implant with no complicating features.  She actually has more offset on that side as well as a longer length.  I gave her reassurance that with hip strengthening and time this should hopefully resolve.  I would like to send her to [employer] for an insert for her left shoe that would hopefully improve her leg length.  All question concerns were answered addressed.  We will see her back in 4 to 5 weeks to see how she is doing overall but no x-rays are needed.  Obviously if the hip comes out of place we will have to address things accordingly.  Right now and cannot recommend anything operative based on her x-ray findings and exam today.  All I can do is give her reassurance that the hip is intact and in place.  Her previous MRI does show signs of avascular necrosis on the left side and certainly will likely need a left hip replacement at some point.  Again we will reevaluate her at her next appointment.

## 2020-03-16 DIAGNOSIS — F341 Dysthymic disorder: Secondary | ICD-10-CM | POA: Diagnosis not present

## 2020-03-16 DIAGNOSIS — G4730 Sleep apnea, unspecified: Secondary | ICD-10-CM | POA: Diagnosis not present

## 2020-03-16 DIAGNOSIS — G4709 Other insomnia: Secondary | ICD-10-CM | POA: Diagnosis not present

## 2020-03-16 DIAGNOSIS — F4312 Post-traumatic stress disorder, chronic: Secondary | ICD-10-CM | POA: Diagnosis not present

## 2020-03-18 ENCOUNTER — Ambulatory Visit (INDEPENDENT_AMBULATORY_CARE_PROVIDER_SITE_OTHER): Payer: Medicare Other | Admitting: Orthopaedic Surgery

## 2020-03-18 ENCOUNTER — Other Ambulatory Visit: Payer: Self-pay

## 2020-03-18 ENCOUNTER — Encounter: Payer: Self-pay | Admitting: Orthopaedic Surgery

## 2020-03-18 DIAGNOSIS — Z79891 Long term (current) use of opiate analgesic: Secondary | ICD-10-CM | POA: Diagnosis not present

## 2020-03-18 DIAGNOSIS — G894 Chronic pain syndrome: Secondary | ICD-10-CM | POA: Diagnosis not present

## 2020-03-18 DIAGNOSIS — M961 Postlaminectomy syndrome, not elsewhere classified: Secondary | ICD-10-CM | POA: Diagnosis not present

## 2020-03-18 DIAGNOSIS — M533 Sacrococcygeal disorders, not elsewhere classified: Secondary | ICD-10-CM | POA: Diagnosis not present

## 2020-03-18 DIAGNOSIS — M87059 Idiopathic aseptic necrosis of unspecified femur: Secondary | ICD-10-CM | POA: Diagnosis not present

## 2020-03-18 DIAGNOSIS — Z79899 Other long term (current) drug therapy: Secondary | ICD-10-CM | POA: Diagnosis not present

## 2020-03-18 DIAGNOSIS — Z96641 Presence of right artificial hip joint: Secondary | ICD-10-CM

## 2020-03-18 NOTE — Progress Notes (Signed)
The patient is now 9 weeks status post a right total hip arthroplasty secondary to avascular necrosis of the right hip.  She does have AVN of the left hip.  She is having some left hip pain and at some point is wanting to proceed with a left total hip arthroplasty.  However, she still occasionally has symptoms that the right operative hip is popping out of joint.  She says that he has a feeling when she is bending.  However on exam I cannot replicate that or have her replicated.  I scrutinized the x-rays from interoperative and postoperative and examined her thoroughly and I do not feel any instability of that hip at all.  My thoughts are this may be related to needing to really strengthen the muscles around her right hip.  I talked to her in length about this.  I do feel it is worth setting her up for outpatient physical therapy and she actually broached this idea as well.  We will set her up for outpatient physical therapy to hopefully strengthen both hips and this will then help [place] prepare her for surgery in the coming months on her left hip.  She agrees with this treatment plan.  We will work on setting her up for outpatient physical therapy and see her back in 4 weeks from now to see how she is doing overall.  At that visit I would like a standing low AP pelvis.

## 2020-03-19 ENCOUNTER — Other Ambulatory Visit: Payer: Self-pay

## 2020-03-19 DIAGNOSIS — Z96641 Presence of right artificial hip joint: Secondary | ICD-10-CM

## 2020-03-23 DIAGNOSIS — E782 Mixed hyperlipidemia: Secondary | ICD-10-CM | POA: Diagnosis not present

## 2020-03-23 DIAGNOSIS — F329 Major depressive disorder, single episode, unspecified: Secondary | ICD-10-CM | POA: Diagnosis not present

## 2020-03-23 DIAGNOSIS — D500 Iron deficiency anemia secondary to blood loss (chronic): Secondary | ICD-10-CM | POA: Diagnosis not present

## 2020-03-23 DIAGNOSIS — I10 Essential (primary) hypertension: Secondary | ICD-10-CM | POA: Diagnosis not present

## 2020-03-23 DIAGNOSIS — J449 Chronic obstructive pulmonary disease, unspecified: Secondary | ICD-10-CM | POA: Diagnosis not present

## 2020-03-23 DIAGNOSIS — J441 Chronic obstructive pulmonary disease with (acute) exacerbation: Secondary | ICD-10-CM | POA: Diagnosis not present

## 2020-03-23 DIAGNOSIS — G459 Transient cerebral ischemic attack, unspecified: Secondary | ICD-10-CM | POA: Diagnosis not present

## 2020-03-25 ENCOUNTER — Encounter: Payer: Self-pay | Admitting: Physical Therapy

## 2020-03-25 ENCOUNTER — Ambulatory Visit: Payer: Medicare Other | Attending: Orthopaedic Surgery | Admitting: Physical Therapy

## 2020-03-25 ENCOUNTER — Other Ambulatory Visit: Payer: Self-pay

## 2020-03-25 DIAGNOSIS — M25552 Pain in left hip: Secondary | ICD-10-CM | POA: Insufficient documentation

## 2020-03-25 DIAGNOSIS — M25651 Stiffness of right hip, not elsewhere classified: Secondary | ICD-10-CM | POA: Diagnosis not present

## 2020-03-25 DIAGNOSIS — M6281 Muscle weakness (generalized): Secondary | ICD-10-CM | POA: Diagnosis not present

## 2020-03-25 DIAGNOSIS — R2689 Other abnormalities of gait and mobility: Secondary | ICD-10-CM | POA: Insufficient documentation

## 2020-03-25 NOTE — Therapy (Signed)
[place], [place], [address] Phone: [phone]   Fax:  [phone]  Physical Therapy Evaluation  Patient Details  Name: Brenda Carney MRN: [id number] Date of Birth: 1951/03/04 Referring Provider (PT): Jean Rosenthal MD   Encounter Date: 03/25/2020  PT End of Session - 03/25/20 1602    Visit Number  1    Number of Visits  13    Date for PT Re-Evaluation  05/06/20    Authorization Type  MCR: KX mod at 15th visit    Progress Note Due on Visit  10    PT Start Time  1547    Activity Tolerance  Patient tolerated treatment well    Behavior During Therapy  [facility] for tasks assessed/performed       Past Medical History:  Diagnosis Date  . Arthritis    "thumbs; hips" (05/14/2017)  . 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 Cerebrovascular disease    Carotid dopplers which showed no significant increase in velocities, extremely minimal on the left, antegrade vertebral bilaterally.  . Cervical spondylosis   . Chronic lower back pain   . COPD (chronic obstructive pulmonary disease) ([place])    "little bit" (05/14/2017)  . Coronary artery disease, non-occlusive    Coronary calcium scoring: June 2018: Score 1106. 98 percentile for age -[place]  . Depression   . Exercise-induced asthma with acute exacerbation    "only in very hot weather" (05/14/2017)  . GERD (gastroesophageal reflux disease)   . History of IBS    resovlved  . History of kidney stones   . 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 Hyperlipidemia with target LDL less than 70    Mostly statin intolerant,. Currently on Livalo 4 mg  . Hypertension, benign   . Lumbosacral spondylosis   . PAD (peripheral artery disease) (HCC)    Status post right above-the-knee-below the knee popliteal artery bypass; postop right ABI 0.96.  Marland Kitchen Pneumonia 2020  . Sleep  apnea    does not wear CPAP; "think it was medication related" (05/14/2017)  . Stroke [facility]) 2010   TIA  . Thoracic aortic ectasia (HCC)    ~40 mm on Coronary Calcium Score CT - recommend f/u CTA or MRA  . TIA (transient ischemic attack) 09/2011   "several"    Past Surgical History:  Procedure Laterality Date  . ABDOMINAL AORTOGRAM W/LOWER EXTREMITY N/A 05/14/2017   Procedure: Abdominal Aortogram w/Lower Extremity;  Surgeon: Conrad Maybeury, MD;  Location: [facility];  Service: Cardiovascular;  Laterality: N/A;  . ACROMIO-CLAVICULAR JOINT REPAIR Left 1990s   "I think it was called an AC joint removal"  . BACK SURGERY    . BYPASS GRAFT POPLITEAL TO POPLITEAL Right 05/15/2017   Procedure: BYPASS GRAFT ABOVE KNEE POPLITEAL TO BELOW KNEE POPLITEAL ARTERY;  Surgeon: Conrad , MD;  Location: [facility] OR;  Service: Vascular;  Laterality: Right;  . ESOPHAGOGASTRODUODENOSCOPY (EGD) WITH PROPOFOL N/A 04/29/2018   Procedure: ESOPHAGOGASTRODUODENOSCOPY (EGD) WITH PROPOFOL;  Surgeon: Otis Brace, MD;  Location: [facility];  Service: Gastroenterology;  Laterality: N/A;  . FOOT FRACTURE SURGERY Bilateral    multiple procedures  . FRACTURE SURGERY    . MAXIMUM ACCESS (MAS)POSTERIOR LUMBAR INTERBODY FUSION (PLIF) 1 LEVEL N/A 09/30/2014   Procedure: FOR MAXIMUM ACCESS (MAS) POSTERIOR LUMBAR INTERBODY FUSION (PLIF) 1 LEVEL;  Surgeon: Eustace Moore,  MD;  Location: [facility];  Service: Neurosurgery;  Laterality: N/A;  FOR MAXIMUM ACCESS (MAS) POSTERIOR LUMBAR INTERBODY FUSION (PLIF) 1 LEVEL LUMBAR 4-5  . MULTIPLE TOOTH EXTRACTIONS Right 04/2017  . NM MYOVIEW LTD  06/2017    Normal EF, 66%. No ST changes. Normal study. LOW RISK.  Marland Kitchen TOTAL HIP ARTHROPLASTY Right 01/16/2020   Procedure: RIGHT TOTAL HIP ARTHROPLASTY ANTERIOR APPROACH;  Surgeon: Mcarthur Rossetti, MD;  Location: [facility];  Service: Orthopedics;  Laterality: Right;  . TRANSTHORACIC ECHOCARDIOGRAM  09/07/2013   Done to evaluate TIA:  Normal LV size and function. EF 55-60%. GR 1 DD. Mild left atrial dilation. Mildly calcified mitral leaflets. Otherwise normal.    There were no vitals filed for this visit.   Subjective Assessment - 03/25/20 1554    Subjective  pt is a 69 y.o F s/p R total hip replacement on 01/16/2020. Since the surgery she did have HHPT for about 10 visits. She reported initally she was worried it felt like the hip was subluxating but was assured by the MD that it was stable. since the surgery she repots she is getting better. pt reports that she has incresaed pain in the L hip which she is planning to get it done after she is done with PT for the R hip.    Limitations  Standing    How long can you sit comfortably?  unlimited    How long can you stand comfortably?  unlimited    How long can you walk comfortably?  unlimited    Diagnostic tests  x-ray 02/26/2020    Patient Stated Goals  to increase strength, reduce popping/ subluxating sesnation    Currently in Pain?  Yes    Pain Score  0-No pain   5/10 pain the L hip.   Pain Location  Hip    Pain Orientation  Right;Anterior;Lateral    Pain Descriptors / Indicators  Aching;Other (Comment)   popping   Pain Type  Surgical pain    Pain Onset  More than a month ago    Aggravating Factors   N/A    Pain Relieving Factors  N/A    Effect of Pain on Daily Activities  limited more in the L hip regarding walking/ standing         [facility] PT Assessment - 03/25/20 1606      Assessment   Medical Diagnosis  Status post right hip replacement     Referring Provider (PT)  Jean Rosenthal MD    Onset Date/Surgical Date  01/16/20    Hand Dominance  Right    Next MD Visit  04/15/2020    Prior Therapy  yes      Precautions   Precautions  None      Restrictions   Weight Bearing Restrictions  No      Balance Screen   Has the patient fallen in the past 6 months  No      [place] residence    Living Arrangements  Alone     Available Help at Discharge  Family    Type of [place] to enter    Entrance Stairs-Number of Steps  [place]  One level    [place] - 2 wheels;Cane - single point      Prior Function   Level of  Independence  Independent with basic ADLs    Vocation  Retired    Leisure  visiting friends, Traveling, horses,       Cognition   Overall Cognitive Status  Within Functional Limits for tasks assessed      Observation/Other Assessments   Focus on Therapeutic Outcomes (FOTO)   51% limited   predicted 46% limited     Posture/Postural Control   Posture/Postural Control  Postural limitations    Postural Limitations  Forward head;Rounded Shoulders      ROM / Strength   AROM / PROM / Strength  AROM;Strength;PROM      AROM   AROM Assessment Site  Hip    Right/Left Hip  Right;Left    Right Hip Extension  3   utilized trunk rotation to assist    Right Hip Flexion  122    Left Hip Extension  5   utilized trunk rotation to assist    Left Hip Flexion  118      PROM   PROM Assessment Site  Hip      Strength   Strength Assessment Site  Hip;Knee    Right/Left Hip  Right;Left    Right Hip Flexion  4-/5    Right Hip Extension  3+/5    Right Hip ABduction  4-/5    Right Hip ADduction  4/5    Left Hip Flexion  4-/5    Left Hip Extension  3+/5    Left Hip ABduction  4-/5    Left Hip ADduction  4/5    Right/Left Knee  Right;Left    Right Knee Flexion  5/5    Right Knee Extension  5/5    Left Knee Flexion  5/5    Left Knee Extension  5/5      Palpation   Palpation comment  TTP along the surgical scar, and along the iliopsoas and rectus femoris      Ambulation/Gait   Ambulation/Gait  Yes    Gait Pattern  Step-through pattern;Decreased stride length;Antalgic;Decreased stance time - left;Decreased step length - right                Objective measurements completed on examination: See above  findings.      [facility] Adult PT Treatment/Exercise - 03/25/20 1606      Exercises   Exercises  Knee/Hip      Knee/Hip Exercises: Stretches   Hip Flexor Stretch  2 reps;30 seconds   standing with glute set            PT Education - 03/25/20 1603    Education Details  evaluation findings, POC, goals, HEP with proper form/ rationale.    Person(s) Educated  Patient    Methods  Explanation;Verbal cues;Handout    Comprehension  Verbalized understanding;Verbal cues required       PT Short Term Goals - 03/25/20 1729      PT SHORT TERM GOAL #1   Title  pt to be I with inital HEP    Time  4    Period  Weeks    Status  New    Target Date  04/22/20      PT SHORT TERM GOAL #2   Title  pt to report reduced popping sensation to ,</= 2 x a week to promote improvement in condition    Time  4    Period  Weeks    Status  New    Target Date  04/22/20  PT Long Term Goals - 03/25/20 1730      PT LONG TERM GOAL #1   Title  increase R gross hip strength to >/= 4+/5 to promote hip stability    Time  8    Period  Weeks    Status  New    Target Date  05/20/20      PT LONG TERM GOAL #2   Title  increase R hip extension to >/= 6 degrees to promote efficient gait pattern    Time  8    Period  Weeks    Status  New    Target Date  05/20/20      PT LONG TERM GOAL #3   Title  pt to be able to walk >/= 1 mile with no report of pain or limtations per the R hip for functional endurance    Time  6    Period  Weeks    Status  New    Target Date  05/06/20      PT LONG TERM GOAL #4   Title  Increase FOTO score to </= 44% limited to demo improvement in function    Time  8    Period  Weeks    Status  New    Target Date  05/20/20      PT LONG TERM GOAL #5   Title  pt to be I with all HEP as of last visit to maintain and progress current level of function    Time  8    Period  Weeks    Status  New    Target Date  05/20/20             Plan - 03/25/20 1604     Clinical Impression Statement  pt presents to OPPT s/p R THA on 01/16/2020. pt estimated having 10 home health PT following surgery before arriving to outpatient PT. she noted concern regarding snapping/ popping in the hip with trunk flexion, which appears to be more from hip flexor stiffness. She demosntrates functional hip ROM except for hip extension secondary to glute weakness and hip flexor stiffness. TTP along the R hip flexors and iliopsoas. she would benefit from physical therapy to reduce R hip stiffness, increase general hip trength and maximize her function by addressing the deficits listed.    Personal Factors and Comorbidities  Comorbidity 2    Comorbidities  hx of depression, TIA    Stability/Clinical Decision Making  Stable/Uncomplicated    Clinical Decision Making  Low    Rehab Potential  Good    PT Frequency  1x / week    PT Duration  8 weeks    PT Treatment/Interventions  ADLs/Self Care Home Management;Cryotherapy;Electrical Stimulation;Iontophoresis 4mg /ml Dexamethasone;Moist Heat;Ultrasound;Therapeutic activities;Therapeutic exercise;Neuromuscular re-education;Manual techniques;Passive range of motion;Taping;Patient/family education;Dry needling    PT Next Visit Plan  review / update HEP PRn, STW along R hip flexors, Gross hip strength with emphasis on hip extension. stair training,    PT Home Exercise Plan  [phone] - hip flexor stretch (supine /standing), bridges, sidelying hip abduction, lower trunk rotation.    Consulted and Agree with Plan of Care  Patient       Patient will benefit from skilled therapeutic intervention in order to improve the following deficits and impairments:  Improper body mechanics, Increased muscle spasms, Decreased strength, Pain, Postural dysfunction, Decreased activity tolerance, Decreased endurance, Decreased balance, Decreased range of motion  Visit Diagnosis: Stiffness of right hip, not elsewhere classified  Pain in  left hip  Muscle weakness  (generalized)  Other abnormalities of gait and mobility     Problem List Patient Active Problem List   Diagnosis Date Noted  . Status post total replacement of right hip 01/16/2020  . Avascular necrosis of bone of right hip ([place]) 01/06/2020  . CAP (community acquired pneumonia) 06/17/2019  . Leukocytosis 06/17/2019  . Hypoxia   . Sepsis ([place]) 04/29/2018  . Hematemesis 04/29/2018  . Hypokalemia 04/29/2018  . AKI (acute kidney injury) ([place]) 04/29/2018  . Acute respiratory failure with hypoxia ([place]) 04/29/2018  . Thoracic aortic ectasia ([place])   . Sleep apnea   . Pneumonia   . Migraine headache   . Lumbosacral spondylosis   . Hx of multiple concussions   . History of kidney stones   . History of IBS   . GERD (gastroesophageal reflux disease)   . Exercise-induced asthma with acute exacerbation   . Depression   . COPD with acute exacerbation ([place])   . Chronic lower back pain   . Cervical spondylosis   . Cerebral aneurysm   . Arthritis   . Anemia   . Coronary artery disease, non-occlusive 06/28/2017  . Critical lower limb ischemia 05/14/2017  . Preoperative cardiovascular examination   . PAD (peripheral artery disease) ([place])   . S/P lumbar spinal fusion 09/30/2014  . Essential hypertension   . Hyperlipidemia with target LDL less than 70   . Cerebrovascular disease   . Headache(784.0) 10/07/2013  . TIA (transient ischemic attack) 09/05/2013  . Tobacco abuse 09/05/2013   [place]arr [place] PT, DPT, LAT, ATC  03/25/20  5:41 PM      [facility] [address] [place], [place], [address] Phone: [phone]   Fax:  [phone]  Name: Brenda Carney MRN: [id number] Date of Birth: 07-25-1951

## 2020-03-29 ENCOUNTER — Ambulatory Visit: Payer: Medicare Other | Admitting: Orthopaedic Surgery

## 2020-03-30 ENCOUNTER — Ambulatory Visit: Payer: Medicare Other | Admitting: Physical Therapy

## 2020-04-07 ENCOUNTER — Ambulatory Visit: Payer: Medicare Other | Admitting: Physical Therapy

## 2020-04-07 ENCOUNTER — Encounter: Payer: Self-pay | Admitting: Physical Therapy

## 2020-04-07 ENCOUNTER — Other Ambulatory Visit: Payer: Self-pay

## 2020-04-07 DIAGNOSIS — M6281 Muscle weakness (generalized): Secondary | ICD-10-CM

## 2020-04-07 DIAGNOSIS — M25651 Stiffness of right hip, not elsewhere classified: Secondary | ICD-10-CM

## 2020-04-07 DIAGNOSIS — R2689 Other abnormalities of gait and mobility: Secondary | ICD-10-CM | POA: Diagnosis not present

## 2020-04-07 DIAGNOSIS — M25552 Pain in left hip: Secondary | ICD-10-CM

## 2020-04-07 NOTE — Therapy (Signed)
[place], [place], [address] Phone: [phone]   Fax:  [phone]  Physical Therapy Treatment  Patient Details  Name: Brenda Carney MRN: [id number] Date of Birth: 1951-04-19 Referring Provider (PT): Jean Rosenthal MD   Encounter Date: 04/07/2020  PT End of Session - 04/07/20 0932    Visit Number  2    Number of Visits  13    Date for PT Re-Evaluation  05/06/20    Authorization Type  MCR: KX mod at 15th visit    PT Start Time  0927    PT Stop Time  1013    PT Time Calculation (min)  46 min       Past Medical History:  Diagnosis Date  . Arthritis    "thumbs; hips" (05/14/2017)  . 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 Cerebrovascular disease    Carotid dopplers which showed no significant increase in velocities, extremely minimal on the left, antegrade vertebral bilaterally.  . Cervical spondylosis   . Chronic lower back pain   . COPD (chronic obstructive pulmonary disease) ([place])    "little bit" (05/14/2017)  . Coronary artery disease, non-occlusive    Coronary calcium scoring: June 2018: Score 1106. 98 percentile for age -[place]  . Depression   . Exercise-induced asthma with acute exacerbation    "only in very hot weather" (05/14/2017)  . GERD (gastroesophageal reflux disease)   . History of IBS    resovlved  . History of kidney stones   . 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 Hyperlipidemia with target LDL less than 70    Mostly statin intolerant,. Currently on Livalo 4 mg  . Hypertension, benign   . Lumbosacral spondylosis   . PAD (peripheral artery disease) (HCC)    Status post right above-the-knee-below the knee popliteal artery bypass; postop right ABI 0.96.  Marland Kitchen Pneumonia 2020  . Sleep apnea    does not wear CPAP; "think it was medication related" (05/14/2017)  . Stroke [facility])  2010   TIA  . Thoracic aortic ectasia (HCC)    ~40 mm on Coronary Calcium Score CT - recommend f/u CTA or MRA  . TIA (transient ischemic attack) 09/2011   "several"    Past Surgical History:  Procedure Laterality Date  . ABDOMINAL AORTOGRAM W/LOWER EXTREMITY N/A 05/14/2017   Procedure: Abdominal Aortogram w/Lower Extremity;  Surgeon: Conrad Konterra, MD;  Location: [facility];  Service: Cardiovascular;  Laterality: N/A;  . ACROMIO-CLAVICULAR JOINT REPAIR Left 1990s   "I think it was called an AC joint removal"  . BACK SURGERY    . BYPASS GRAFT POPLITEAL TO POPLITEAL Right 05/15/2017   Procedure: BYPASS GRAFT ABOVE KNEE POPLITEAL TO BELOW KNEE POPLITEAL ARTERY;  Surgeon: Conrad [place], MD;  Location[facility];  Service: Vascular;  Laterality: Right;  . ESOPHAGOGASTRODUODENOSCOPY (EGD) WITH PROPOFOL N/A 04/29/2018   Procedure: ESOPHAGOGASTRODUODENOSCOPY (EGD) WITH PROPOFOL;  Surgeon: Otis Brace, MD;  Location: [facility];  Service: Gastroenterology;  Laterality: N/A;  . FOOT FRACTURE SURGERY Bilateral    multiple procedures  . FRACTURE SURGERY    . MAXIMUM ACCESS (MAS)POSTERIOR LUMBAR INTERBODY FUSION (PLIF) 1 LEVEL N/A 09/30/2014   Procedure: FOR MAXIMUM ACCESS (MAS) POSTERIOR LUMBAR INTERBODY FUSION (PLIF) 1 LEVEL;  Surgeon: Eustace Moore, MD;  Location: [facility];  Service: Neurosurgery;  Laterality: N/A;  FOR MAXIMUM ACCESS (MAS) POSTERIOR LUMBAR INTERBODY FUSION (PLIF) 1 LEVEL LUMBAR 4-5  . MULTIPLE TOOTH EXTRACTIONS Right 04/2017  . NM MYOVIEW LTD  06/2017    Normal EF, 66%. No ST changes. Normal study. LOW RISK.  Marland Kitchen TOTAL HIP ARTHROPLASTY Right 01/16/2020   Procedure: RIGHT TOTAL HIP ARTHROPLASTY ANTERIOR APPROACH;  Surgeon: Mcarthur Rossetti, MD;  [facility];  Service: Orthopedics;  Laterality: Right;  . TRANSTHORACIC ECHOCARDIOGRAM  09/07/2013   Done to evaluate TIA: Normal LV size and function. EF 55-60%. GR 1 DD. Mild left atrial dilation. Mildly calcified  mitral leaflets. Otherwise normal.    There were no vitals filed for this visit.  Subjective Assessment - 04/07/20 0931    Subjective  I have 6 steps outside and a basement in my new home. I am going to renovate it before I move in.    Currently in Pain?  No/denies                       [facility] Adult PT Treatment/Exercise - 04/07/20 0001      Knee/Hip Exercises: Stretches   Hip Flexor Stretch Limitations  supine with leg off table     Other Knee/Hip Stretches  lower trunk rotation     Other Knee/Hip Stretches  single knee to  chest , cues for gentle stretch       Knee/Hip Exercises: Aerobic   Nustep  L3 LE x 5 minutes       Knee/Hip Exercises: Supine   Hip Adduction Isometric  20 reps    Hip Adduction Isometric Limitations  5 sec     Bridges  10 reps    Bridges Limitations  pain     Other Supine Knee/Hip Exercises  pelvic tilit x15     Other Supine Knee/Hip Exercises  red band clam x 20 , cues for eccenric control       Knee/Hip Exercises: Sidelying   Hip ABduction  10 reps    Hip ABduction Limitations  2 sets     Clams  unable - fatigued from hip abduction reps       Knee/Hip Exercises: Prone   Straight Leg Raises  10 reps    Straight Leg Raises Limitations  alternating, with tactile cues to prevent complensation       Manual Therapy   Manual therapy comments  Massage roller and manual Soft tissue work to right thigh              PT Education - 04/07/20 1040    Education Details  HEP    Person(s) Educated  Patient    Methods  Explanation;Handout    Comprehension  Verbalized understanding       PT Short Term Goals - 03/25/20 1729      PT SHORT TERM GOAL #1   Title  pt to be I with inital HEP    Time  4    Period  Weeks    Status  New    Target Date  04/22/20      PT SHORT TERM GOAL #2   Title  pt to report reduced popping sensation to ,</= 2 x a week to promote improvement in condition    Time  4    Period  Weeks    Status  New     Target Date  04/22/20        PT Long Term Goals - 03/25/20 1730  PT LONG TERM GOAL #1   Title  increase R gross hip strength to >/= 4+/5 to promote hip stability    Time  8    Period  Weeks    Status  New    Target Date  05/20/20      PT LONG TERM GOAL #2   Title  increase R hip extension to >/= 6 degrees to promote efficient gait pattern    Time  8    Period  Weeks    Status  New    Target Date  05/20/20      PT LONG TERM GOAL #3   Title  pt to be able to walk >/= 1 mile with no report of pain or limtations per the R hip for functional endurance    Time  6    Period  Weeks    Status  New    Target Date  05/06/20      PT LONG TERM GOAL #4   Title  Increase FOTO score to </= 44% limited to demo improvement in function    Time  8    Period  Weeks    Status  New    Target Date  05/20/20      PT LONG TERM GOAL #5   Title  pt to be I with all HEP as of last visit to maintain and progress current level of function    Time  8    Period  Weeks    Status  New    Target Date  05/20/20            Plan - 04/07/20 1042    Clinical Impression Statement  Pt arrives reporting inconsistent HEP. Time spent with review of HEP and progresion. Soft tissue work at end of session to decrease tension in hip flexors. She reported feeling less tension afterward. She declined modalities and was encouraged to use ice or heat at home for DOMS.    PT Next Visit Plan  review / update HEP PRn, STW along R hip flexors, Gross hip strength with emphasis on hip extension. stair training,    PT Home Exercise Plan  [phone] - hip flexor stretch (supine /standing), bridges, sidelying hip abduction, lower trunk rotation. added supine clam red band, prone hip extension.       Patient will benefit from skilled therapeutic intervention in order to improve the following deficits and impairments:  Improper body mechanics, Increased muscle spasms, Decreased strength, Pain, Postural dysfunction,  Decreased activity tolerance, Decreased endurance, Decreased balance, Decreased range of motion  Visit Diagnosis: Pain in left hip  Stiffness of right hip, not elsewhere classified  Muscle weakness (generalized)  Other abnormalities of gait and mobility     Problem List Patient Active Problem List   Diagnosis Date Noted  . Status post total replacement of right hip 01/16/2020  . Avascular necrosis of bone of right hip ([place]) 01/06/2020  . CAP (community acquired pneumonia) 06/17/2019  . Leukocytosis 06/17/2019  . Hypoxia   . Sepsis ([place]) 04/29/2018  . Hematemesis 04/29/2018  . Hypokalemia 04/29/2018  . AKI (acute kidney injury) ([place]) 04/29/2018  . Acute respiratory failure with hypoxia ([place]) 04/29/2018  . Thoracic aortic ectasia ([place])   . Sleep apnea   . Pneumonia   . Migraine headache   . Lumbosacral spondylosis   . Hx of multiple concussions   . History of kidney stones   . History of IBS   . GERD (gastroesophageal reflux  disease)   . Exercise-induced asthma with acute exacerbation   . Depression   . COPD with acute exacerbation ([place])   . Chronic lower back pain   . Cervical spondylosis   . Cerebral aneurysm   . Arthritis   . Anemia   . Coronary artery disease, non-occlusive 06/28/2017  . Critical lower limb ischemia 05/14/2017  . Preoperative cardiovascular examination   . PAD (peripheral artery disease) ([place])   . S/P lumbar spinal fusion 09/30/2014  . Essential hypertension   . Hyperlipidemia with target LDL less than 70   . Cerebrovascular disease   . Headache(784.0) 10/07/2013  . TIA (transient ischemic attack) 09/05/2013  . Tobacco abuse 09/05/2013    Dorene Ar, PTA 04/07/2020, 10:46 AM  [facility] [address] [place], [place], [address] Phone: [phone]   Fax:  [phone]  Name: IONA BOSIER MRN: [id number] Date of Birth: February 28, 1951

## 2020-04-07 NOTE — Patient Instructions (Signed)
Access Code: [email or web address] URL: [email or web address] Date: 04/07/2020 Prepared by: Hessie Diener  Exercises Hooklying Clamshell with Resistance - 2 x daily - 7 x weekly - 2 sets - 10 reps - 5 hold Prone Hip Extension - 2 x daily - 7 x weekly - 2 sets - 10 reps - 5 hold

## 2020-04-13 DIAGNOSIS — M5137 Other intervertebral disc degeneration, lumbosacral region: Secondary | ICD-10-CM | POA: Diagnosis not present

## 2020-04-13 DIAGNOSIS — M533 Sacrococcygeal disorders, not elsewhere classified: Secondary | ICD-10-CM | POA: Diagnosis not present

## 2020-04-13 DIAGNOSIS — M87059 Idiopathic aseptic necrosis of unspecified femur: Secondary | ICD-10-CM | POA: Diagnosis not present

## 2020-04-13 DIAGNOSIS — G894 Chronic pain syndrome: Secondary | ICD-10-CM | POA: Diagnosis not present

## 2020-04-14 ENCOUNTER — Other Ambulatory Visit: Payer: Self-pay

## 2020-04-14 ENCOUNTER — Ambulatory Visit: Payer: Medicare Other | Admitting: Physical Therapy

## 2020-04-14 ENCOUNTER — Encounter: Payer: Self-pay | Admitting: Physical Therapy

## 2020-04-14 DIAGNOSIS — R2689 Other abnormalities of gait and mobility: Secondary | ICD-10-CM

## 2020-04-14 DIAGNOSIS — M25651 Stiffness of right hip, not elsewhere classified: Secondary | ICD-10-CM

## 2020-04-14 DIAGNOSIS — M6281 Muscle weakness (generalized): Secondary | ICD-10-CM | POA: Diagnosis not present

## 2020-04-14 DIAGNOSIS — M25552 Pain in left hip: Secondary | ICD-10-CM | POA: Diagnosis not present

## 2020-04-14 NOTE — Therapy (Signed)
[place], [place], [address] Phone: [phone]   Fax:  [phone]  Physical Therapy Treatment  Patient Details  Name: Brenda Carney MRN: [id number] Date of Birth: Jun 24, 1951 Referring Provider (PT): Jean Rosenthal MD   Encounter Date: 04/14/2020  PT End of Session - 04/14/20 1107    Visit Number  3    Number of Visits  13    Date for PT Re-Evaluation  05/06/20    Authorization Type  MCR: KX mod at 15th visit    PT Start Time  1102    PT Stop Time  1145    PT Time Calculation (min)  43 min       Past Medical History:  Diagnosis Date  . Arthritis    "thumbs; hips" (05/14/2017)  . 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 Cerebrovascular disease    Carotid dopplers which showed no significant increase in velocities, extremely minimal on the left, antegrade vertebral bilaterally.  . Cervical spondylosis   . Chronic lower back pain   . COPD (chronic obstructive pulmonary disease) ([place])    "little bit" (05/14/2017)  . Coronary artery disease, non-occlusive    Coronary calcium scoring: June 2018: Score 1106. 98 percentile for age -[place]  . Depression   . Exercise-induced asthma with acute exacerbation    "only in very hot weather" (05/14/2017)  . GERD (gastroesophageal reflux disease)   . History of IBS    resovlved  . History of kidney stones   . 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 Hyperlipidemia with target LDL less than 70    Mostly statin intolerant,. Currently on Livalo 4 mg  . Hypertension, benign   . Lumbosacral spondylosis   . PAD (peripheral artery disease) (HCC)    Status post right above-the-knee-below the knee popliteal artery bypass; postop right ABI 0.96.  Marland Kitchen Pneumonia 2020  . Sleep apnea    does not wear CPAP; "think it was medication related" (05/14/2017)  . Stroke [facility])  2010   TIA  . Thoracic aortic ectasia (HCC)    ~40 mm on Coronary Calcium Score CT - recommend f/u CTA or MRA  . TIA (transient ischemic attack) 09/2011   "several"    Past Surgical History:  Procedure Laterality Date  . ABDOMINAL AORTOGRAM W/LOWER EXTREMITY N/A 05/14/2017   Procedure: Abdominal Aortogram w/Lower Extremity;  Surgeon: Conrad Union, MD;  Location: [facility];  Service: Cardiovascular;  Laterality: N/A;  . ACROMIO-CLAVICULAR JOINT REPAIR Left 1990s   "I think it was called an AC joint removal"  . BACK SURGERY    . BYPASS GRAFT POPLITEAL TO POPLITEAL Right 05/15/2017   Procedure: BYPASS GRAFT ABOVE KNEE POPLITEAL TO BELOW KNEE POPLITEAL ARTERY;  Surgeon: Conrad Hubbard, MD;  Location: [facility];  Service: Vascular;  Laterality: Right;  . ESOPHAGOGASTRODUODENOSCOPY (EGD) WITH PROPOFOL N/A 04/29/2018   Procedure: ESOPHAGOGASTRODUODENOSCOPY (EGD) WITH PROPOFOL;  Surgeon: Otis Brace, MD;  Location: [facility];  Service: Gastroenterology;  Laterality: N/A;  . FOOT FRACTURE SURGERY Bilateral    multiple procedures  . FRACTURE SURGERY    . MAXIMUM ACCESS (MAS)POSTERIOR LUMBAR INTERBODY FUSION (PLIF) 1 LEVEL N/A 09/30/2014   Procedure: FOR MAXIMUM ACCESS (MAS) POSTERIOR LUMBAR INTERBODY FUSION (PLIF) 1 LEVEL;  Surgeon: Eustace Moore, MD;  Location: [facility] [facility];  Service: Neurosurgery;  Laterality: N/A;  FOR MAXIMUM ACCESS (MAS) POSTERIOR LUMBAR INTERBODY FUSION (PLIF) 1 LEVEL LUMBAR 4-5  . MULTIPLE TOOTH EXTRACTIONS Right 04/2017  . NM MYOVIEW LTD  06/2017    Normal EF, 66%. No ST changes. Normal study. LOW RISK.  Marland Kitchen TOTAL HIP ARTHROPLASTY Right 01/16/2020   Procedure: RIGHT TOTAL HIP ARTHROPLASTY ANTERIOR APPROACH;  Surgeon: Mcarthur Rossetti, MD;  Location: [facility];  Service: Orthopedics;  Laterality: Right;  . TRANSTHORACIC ECHOCARDIOGRAM  09/07/2013   Done to evaluate TIA: Normal LV size and function. EF 55-60%. GR 1 DD. Mild left atrial dilation. Mildly calcified  mitral leaflets. Otherwise normal.    There were no vitals filed for this visit.  Subjective Assessment - 04/14/20 1105    Subjective  My hip is hurting today. 7/10. I just took a pain pill. It has not kicked in yet.    Currently in Pain?  Yes    Pain Score  7     Pain Location  Hip    Pain Orientation  Right;Anterior;Posterior;Lateral    Pain Descriptors / Indicators  Aching    Aggravating Factors   moving, lifting, loading, unloading    Pain Relieving Factors  pain meds, rest                       [facility] Adult PT Treatment/Exercise - 04/14/20 0001      Knee/Hip Exercises: Stretches   Hip Flexor Stretch  2 reps;30 seconds   standing with glute set   Hip Flexor Stretch Limitations  also supine with leg off table       Knee/Hip Exercises: Aerobic   Nustep  L3 LE x 6 minutes       Knee/Hip Exercises: Standing   Functional Squat Limitations  holding back of chair , glueal squeeze on extension     Other Standing Knee Exercises  standing hip abduction x 10  each , hip extension x 10       Knee/Hip Exercises: Supine   Quad Sets  10 reps    Bridges  20 reps    Bridges with Clamshell  10 reps   green   Straight Leg Raises  10 reps    Other Supine Knee/Hip Exercises  pelvic tilit x15     Other Supine Knee/Hip Exercises  green  band clam x 20 , cues for eccenric control       Knee/Hip Exercises: Sidelying   Hip ABduction  10 reps    Hip ABduction Limitations  2 sets     Clams  x 10       Manual Therapy   Manual therapy comments  Rock blade and manual Soft tissue work to right thigh                PT Short Term Goals - 03/25/20 1729      PT SHORT TERM GOAL #1   Title  pt to be I with inital HEP    Time  4    Period  Weeks    Status  New    Target Date  04/22/20      PT SHORT TERM GOAL #2   Title  pt to report reduced popping sensation to ,</= 2 x a week to promote improvement in condition    Time  4    Period  Weeks    Status  New    Target  Date  04/22/20        PT Long Term Goals -  03/25/20 1730      PT LONG TERM GOAL #1   Title  increase R gross hip strength to >/= 4+/5 to promote hip stability    Time  8    Period  Weeks    Status  New    Target Date  05/20/20      PT LONG TERM GOAL #2   Title  increase R hip extension to >/= 6 degrees to promote efficient gait pattern    Time  8    Period  Weeks    Status  New    Target Date  05/20/20      PT LONG TERM GOAL #3   Title  pt to be able to walk >/= 1 mile with no report of pain or limtations per the R hip for functional endurance    Time  6    Period  Weeks    Status  New    Target Date  05/06/20      PT LONG TERM GOAL #4   Title  Increase FOTO score to </= 44% limited to demo improvement in function    Time  8    Period  Weeks    Status  New    Target Date  05/20/20      PT LONG TERM GOAL #5   Title  pt to be I with all HEP as of last visit to maintain and progress current level of function    Time  8    Period  Weeks    Status  New    Target Date  05/20/20            Plan - 04/14/20 1109    Clinical Impression Statement  pt reports increased pain today due to lifting and loading/unloading while moving into and renovating a home. Increased standing therex today with good tolerance. She was able to perform side clam today.    PT Next Visit Plan  review / update HEP PRn, STW along R hip flexors, Gross hip strength with emphasis on hip extension. stair training,    PT Home Exercise Plan  [phone] - hip flexor stretch (supine /standing), bridges, sidelying hip abduction, lower trunk rotation. added supine clam red band, prone hip extension.       Patient will benefit from skilled therapeutic intervention in order to improve the following deficits and impairments:  Improper body mechanics, Increased muscle spasms, Decreased strength, Pain, Postural dysfunction, Decreased activity tolerance, Decreased endurance, Decreased balance, Decreased range of  motion  Visit Diagnosis: Pain in left hip  Stiffness of right hip, not elsewhere classified  Muscle weakness (generalized)  Other abnormalities of gait and mobility     Problem List Patient Active Problem List   Diagnosis Date Noted  . Status post total replacement of right hip 01/16/2020  . Avascular necrosis of bone of right hip ([place]) 01/06/2020  . CAP (community acquired pneumonia) 06/17/2019  . Leukocytosis 06/17/2019  . Hypoxia   . Sepsis ([place]) 04/29/2018  . Hematemesis 04/29/2018  . Hypokalemia 04/29/2018  . AKI (acute kidney injury) ([place]) 04/29/2018  . Acute respiratory failure with hypoxia ([place]) 04/29/2018  . Thoracic aortic ectasia ([place])   . Sleep apnea   . Pneumonia   . Migraine headache   . Lumbosacral spondylosis   . Hx of multiple concussions   . History of kidney stones   . History of IBS   . GERD (gastroesophageal reflux disease)   . Exercise-induced asthma with acute  exacerbation   . Depression   . COPD with acute exacerbation ([place])   . Chronic lower back pain   . Cervical spondylosis   . Cerebral aneurysm   . Arthritis   . Anemia   . Coronary artery disease, non-occlusive 06/28/2017  . Critical lower limb ischemia 05/14/2017  . Preoperative cardiovascular examination   . PAD (peripheral artery disease) ([place])   . S/P lumbar spinal fusion 09/30/2014  . Essential hypertension   . Hyperlipidemia with target LDL less than 70   . Cerebrovascular disease   . Headache(784.0) 10/07/2013  . TIA (transient ischemic attack) 09/05/2013  . Tobacco abuse 09/05/2013    Dorene Ar, PTA 04/14/2020, 12:33 PM  [facility] - [facility] [address] [place], [place], [address] Phone: [phone]   Fax:  [phone]  Name: Brenda Carney MRN: [id number] Date of Birth: October 12, 1951

## 2020-04-15 ENCOUNTER — Encounter: Payer: Self-pay | Admitting: Orthopaedic Surgery

## 2020-04-15 ENCOUNTER — Ambulatory Visit (INDEPENDENT_AMBULATORY_CARE_PROVIDER_SITE_OTHER): Payer: Medicare Other | Admitting: Orthopaedic Surgery

## 2020-04-15 ENCOUNTER — Ambulatory Visit (INDEPENDENT_AMBULATORY_CARE_PROVIDER_SITE_OTHER): Payer: Medicare Other

## 2020-04-15 DIAGNOSIS — M87052 Idiopathic aseptic necrosis of left femur: Secondary | ICD-10-CM | POA: Diagnosis not present

## 2020-04-15 DIAGNOSIS — Z96641 Presence of right artificial hip joint: Secondary | ICD-10-CM

## 2020-04-15 DIAGNOSIS — I2510 Atherosclerotic heart disease of native coronary artery without angina pectoris: Secondary | ICD-10-CM | POA: Diagnosis not present

## 2020-04-15 NOTE — Progress Notes (Signed)
Office Visit Note   Patient: Brenda Carney           Date of Birth: [birth year]           MRN: [id number] Visit Date: 04/15/2020              Requested by: Leeroy Cha, MD [address] [place],  [place] [address] PCP: Leeroy Cha, MD   Assessment & Plan: Visit Diagnoses:  1. Status post right hip replacement   2. Avascular necrosis of bone of left hip (HCC)     Plan: Given the known avascular necrosis of her left hip femoral head and her worsening pain we are recommending a total hip arthroplasty on the left side.  Having had this done on the right side she is fully aware of what the surgery involves as well as the risk and benefits.  We had a long thorough discussion about this and she does wish to proceed with a left total hip arthroplasty in the near future which I feel is warranted secondary to her pain and her known significant avascular necrosis.  Follow-Up Instructions: Return for 2 weeks post-op.   Orders:  Orders Placed This Encounter  Procedures  . XR Pelvis 1-2 Views   No orders of the defined types were placed in this encounter.     Procedures: No procedures performed   Clinical Data: No additional findings.   Subjective: Chief Complaint  Patient presents with  . Right Hip - Routine Post Op  The patient is just over 3 months status post a right total hip arthroplasty secondary to severe avascular necrosis.  The MRI of her hips also showed avascular process of her left hip.  There was no femoral head collapse but there was a large area avascular process.  At this point her left hip pain is significant.  She is wishing to proceed with a left total hip arthroplasty given the success of her right hip.  Her right hip does stay a little bit painful to her.  She still says that she feels like the hip subluxes but I can have her elicit that on my physical exam in the 3 months seen her.  The hip is never come out of place.  She walks  without assistive device and appears very comfortable.  Her leg lengths are equal.  HPI  Review of Systems She currently denies any headache, chest pain, shortness of breath, fever, chills, nausea, vomiting  Objective: Vital Signs: There were no vitals taken for this visit.  Physical Exam She is alert and orient x3 and in no acute distress Ortho Exam Examination of her right hip shows that it moves fluidly and smoothly.  Her left hip has severe pain with internal and external rotation. Specialty Comments:  No specialty comments available.  Imaging: XR Pelvis 1-2 Views  Result Date: 04/15/2020 A low AP pelvis shows a right total hip arthroplasty with no complicating features.  The left hip femoral head shows subtle signs of avascular necrosis.  The MRI of her pelvis from several months ago does show significant avascular necrosis over a wide area of the left femoral head.  PMFS History: Patient Active Problem List   Diagnosis Date Noted  . Avascular necrosis of bone of left hip ([place]) 04/15/2020  . Status post total replacement of right hip 01/16/2020  . Avascular necrosis of bone of right hip ([place]) 01/06/2020  . CAP (community acquired pneumonia) 06/17/2019  . Leukocytosis 06/17/2019  .  Hypoxia   . Sepsis ([place]) 04/29/2018  . Hematemesis 04/29/2018  . Hypokalemia 04/29/2018  . AKI (acute kidney injury) ([place]) 04/29/2018  . Acute respiratory failure with hypoxia ([place]) 04/29/2018  . Thoracic aortic ectasia ([place])   . Sleep apnea   . Pneumonia   . Migraine headache   . Lumbosacral spondylosis   . Hx of multiple concussions   . History of kidney stones   . History of IBS   . GERD (gastroesophageal reflux disease)   . Exercise-induced asthma with acute exacerbation   . Depression   . COPD with acute exacerbation ([place])   . Chronic lower back pain   . Cervical spondylosis   . Cerebral aneurysm   . Arthritis   . Anemia   . Coronary artery disease, non-occlusive 06/28/2017    . Critical lower limb ischemia 05/14/2017  . Preoperative cardiovascular examination   . PAD (peripheral artery disease) ([place])   . S/P lumbar spinal fusion 09/30/2014  . Essential hypertension   . Hyperlipidemia with target LDL less than 70   . Cerebrovascular disease   . Headache(784.0) 10/07/2013  . TIA (transient ischemic attack) 09/05/2013  . Tobacco abuse 09/05/2013   Past Medical History:  Diagnosis Date  . Arthritis    "thumbs; hips" (05/14/2017)  . 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 Cerebrovascular disease    Carotid dopplers which showed no significant increase in velocities, extremely minimal on the left, antegrade vertebral bilaterally.  . Cervical spondylosis   . Chronic lower back pain   . COPD (chronic obstructive pulmonary disease) ([place])    "little bit" (05/14/2017)  . Coronary artery disease, non-occlusive    Coronary calcium scoring: June 2018: Score 1106. 98 percentile for age -[place]  . Depression   . Exercise-induced asthma with acute exacerbation    "only in very hot weather" (05/14/2017)  . GERD (gastroesophageal reflux disease)   . History of IBS    resovlved  . History of kidney stones   . 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 Hyperlipidemia with target LDL less than 70    Mostly statin intolerant,. Currently on Livalo 4 mg  . Hypertension, benign   . Lumbosacral spondylosis   . PAD (peripheral artery disease) (HCC)    Status post right above-the-knee-below the knee popliteal artery bypass; postop right ABI 0.96.  Marland Kitchen Pneumonia 2020  . Sleep apnea    does not wear CPAP; "think it was medication related" (05/14/2017)  . Stroke [facility]) 2010   TIA  . Thoracic aortic ectasia (HCC)    ~40 mm on Coronary Calcium Score CT - recommend f/u CTA or MRA  . TIA (transient ischemic attack) 09/2011   "several"    Family History  Problem Relation Age of Onset   . Stroke Father   . Heart attack Mother   . Stroke Mother     Past Surgical History:  Procedure Laterality Date  . ABDOMINAL AORTOGRAM W/LOWER EXTREMITY N/A 05/14/2017   Procedure: Abdominal Aortogram w/Lower Extremity;  Surgeon: Conrad [place], MD;  Location: [facility];  Service: Cardiovascular;  Laterality: N/A;  . ACROMIO-CLAVICULAR JOINT REPAIR Left 1990s   "I think it was called an AC joint removal"  . BACK SURGERY    . BYPASS GRAFT POPLITEAL TO POPLITEAL Right 05/15/2017   Procedure: BYPASS GRAFT ABOVE KNEE POPLITEAL TO BELOW KNEE POPLITEAL ARTERY;  Surgeon: Conrad [place], MD;  Location[facility];  Service: Vascular;  Laterality: Right;  . ESOPHAGOGASTRODUODENOSCOPY (EGD) WITH PROPOFOL N/A 04/29/2018   Procedure: ESOPHAGOGASTRODUODENOSCOPY (EGD) WITH PROPOFOL;  Surgeon: Otis Brace, MD;  Location: [facility];  Service: Gastroenterology;  Laterality: N/A;  . FOOT FRACTURE SURGERY Bilateral    multiple procedures  . FRACTURE SURGERY    . MAXIMUM ACCESS (MAS)POSTERIOR LUMBAR INTERBODY FUSION (PLIF) 1 LEVEL N/A 09/30/2014   Procedure: FOR MAXIMUM ACCESS (MAS) POSTERIOR LUMBAR INTERBODY FUSION (PLIF) 1 LEVEL;  Surgeon: Eustace Moore, MD;  Location: [place] [facility];  Service: Neurosurgery;  Laterality: N/A;  FOR MAXIMUM ACCESS (MAS) POSTERIOR LUMBAR INTERBODY FUSION (PLIF) 1 LEVEL LUMBAR 4-5  . MULTIPLE TOOTH EXTRACTIONS Right 04/2017  . NM MYOVIEW LTD  06/2017    Normal EF, 66%. No ST changes. Normal study. LOW RISK.  Marland Kitchen TOTAL HIP ARTHROPLASTY Right 01/16/2020   Procedure: RIGHT TOTAL HIP ARTHROPLASTY ANTERIOR APPROACH;  Surgeon: Mcarthur Rossetti, MD;  [facility];  Service: Orthopedics;  Laterality: Right;  . TRANSTHORACIC ECHOCARDIOGRAM  09/07/2013   Done to evaluate TIA: Normal LV size and function. EF 55-60%. GR 1 DD. Mild left atrial dilation. Mildly calcified mitral leaflets. Otherwise normal.   Social History   Occupational History  . Occupation: Horse Breeder    Tobacco Use  . Smoking status: Heavy Tobacco Smoker    Packs/day: 0.02    Years: 36.00    Pack years: 0.72    Types: Cigarettes    Last attempt to quit: 04/24/2017    Years since quitting: 2.9  . Smokeless tobacco: Never Used  Substance and Sexual Activity  . Alcohol use: Yes    Alcohol/week: 3.0 standard drinks    Types: 3 Glasses of wine per week  . Drug use: No  . Sexual activity: Never

## 2020-04-16 ENCOUNTER — Other Ambulatory Visit: Payer: Self-pay

## 2020-04-20 ENCOUNTER — Telehealth: Payer: Self-pay | Admitting: *Deleted

## 2020-04-20 NOTE — Care Plan (Signed)
Ortho bundle pre-op call to patient to discuss her upcoming Left-THA with Dr. Ninfa Linden on 04/30/20. Patient has recently had her Right THA done with Dr. Ninfa Linden in January of this year. Reviewed that CM will be following along again for any needs related to her upcoming surgery. She is agreeable. Anticipates no HHPT will be needed after a short hospital stay as she is familiar with all home exercises. She has a cane, FWW and states she does not have a 3in1 or BSC. Will order this through [facility] prior to surgery for delivery to the hospital prior to discharge. Will continue to monitor for needs.

## 2020-04-20 NOTE — Telephone Encounter (Signed)
Ortho bundle pre-op call completed for Left THA. Previous Right THA done in January of this year and just completed 90 day call to patient as well.

## 2020-04-20 NOTE — Telephone Encounter (Signed)
90 day Ortho bundle call completed. 

## 2020-04-21 ENCOUNTER — Other Ambulatory Visit: Payer: Self-pay

## 2020-04-21 ENCOUNTER — Ambulatory Visit: Payer: Medicare Other | Admitting: Physical Therapy

## 2020-04-21 ENCOUNTER — Encounter: Payer: Self-pay | Admitting: Physical Therapy

## 2020-04-21 DIAGNOSIS — R2689 Other abnormalities of gait and mobility: Secondary | ICD-10-CM | POA: Diagnosis not present

## 2020-04-21 DIAGNOSIS — M6281 Muscle weakness (generalized): Secondary | ICD-10-CM | POA: Diagnosis not present

## 2020-04-21 DIAGNOSIS — M25552 Pain in left hip: Secondary | ICD-10-CM | POA: Diagnosis not present

## 2020-04-21 DIAGNOSIS — M25651 Stiffness of right hip, not elsewhere classified: Secondary | ICD-10-CM

## 2020-04-21 NOTE — Therapy (Signed)
[place], [place], [address] Phone: [phone]   Fax:  [phone]  Physical Therapy Treatment / discharge  Patient Details  Name: Brenda Carney MRN: [id number] Date of Birth: 04/10/1951 Referring Provider (PT): Jean Rosenthal MD   Encounter Date: 04/21/2020  PT End of Session - 04/21/20 1058    Visit Number  4    Number of Visits  13    Date for PT Re-Evaluation  05/06/20    Authorization Type  MCR: KX mod at 15th visit    Progress Note Due on Visit  10    PT Start Time  1102    PT Stop Time  1130    PT Time Calculation (min)  28 min    Activity Tolerance  Patient tolerated treatment well    Behavior During Therapy  [facility] for tasks assessed/performed       Past Medical History:  Diagnosis Date  . Arthritis    "thumbs; hips" (05/14/2017)  . 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 Cerebrovascular disease    Carotid dopplers which showed no significant increase in velocities, extremely minimal on the left, antegrade vertebral bilaterally.  . Cervical spondylosis   . Chronic lower back pain   . COPD (chronic obstructive pulmonary disease) ([place])    "little bit" (05/14/2017)  . Coronary artery disease, non-occlusive    Coronary calcium scoring: June 2018: Score 1106. 98 percentile for age -[place]  . Depression   . Exercise-induced asthma with acute exacerbation    "only in very hot weather" (05/14/2017)  . GERD (gastroesophageal reflux disease)   . History of IBS    resovlved  . History of kidney stones   . 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 Hyperlipidemia with target LDL less than 70    Mostly statin intolerant,. Currently on Livalo 4 mg  . Hypertension, benign   . Lumbosacral spondylosis   . PAD (peripheral artery disease) (HCC)    Status post right above-the-knee-below the knee  popliteal artery bypass; postop right ABI 0.96.  Marland Kitchen Pneumonia 2020  . Sleep apnea    does not wear CPAP; "think it was medication related" (05/14/2017)  . Stroke [facility]) 2010   TIA  . Thoracic aortic ectasia (HCC)    ~40 mm on Coronary Calcium Score CT - recommend f/u CTA or MRA  . TIA (transient ischemic attack) 09/2011   "several"    Past Surgical History:  Procedure Laterality Date  . ABDOMINAL AORTOGRAM W/LOWER EXTREMITY N/A 05/14/2017   Procedure: Abdominal Aortogram w/Lower Extremity;  Surgeon: Conrad Anderson, MD;  Location: [place] [facility];  Service: Cardiovascular;  Laterality: N/A;  . ACROMIO-CLAVICULAR JOINT REPAIR Left 1990s   "I think it was called an AC joint removal"  . BACK SURGERY    . BYPASS GRAFT POPLITEAL TO POPLITEAL Right 05/15/2017   Procedure: BYPASS GRAFT ABOVE KNEE POPLITEAL TO BELOW KNEE POPLITEAL ARTERY;  Surgeon: Conrad Floris, MD;  [facility] OR;  Service: Vascular;  Laterality: Right;  . ESOPHAGOGASTRODUODENOSCOPY (EGD) WITH PROPOFOL N/A 04/29/2018   Procedure: ESOPHAGOGASTRODUODENOSCOPY (EGD) WITH PROPOFOL;  Surgeon: Otis Brace, MD;  Location: [place];  Service: Gastroenterology;  Laterality: N/A;  . FOOT FRACTURE SURGERY Bilateral    multiple procedures  . FRACTURE SURGERY    . MAXIMUM ACCESS (MAS)POSTERIOR LUMBAR INTERBODY FUSION (PLIF) 1 LEVEL N/A  09/30/2014   Procedure: FOR MAXIMUM ACCESS (MAS) POSTERIOR LUMBAR INTERBODY FUSION (PLIF) 1 LEVEL;  Surgeon: Eustace Moore, MD;  Location: [place] [facility];  Service: Neurosurgery;  Laterality: N/A;  FOR MAXIMUM ACCESS (MAS) POSTERIOR LUMBAR INTERBODY FUSION (PLIF) 1 LEVEL LUMBAR 4-5  . MULTIPLE TOOTH EXTRACTIONS Right 04/2017  . NM MYOVIEW LTD  06/2017    Normal EF, 66%. No ST changes. Normal study. LOW RISK.  Marland Kitchen TOTAL HIP ARTHROPLASTY Right 01/16/2020   Procedure: RIGHT TOTAL HIP ARTHROPLASTY ANTERIOR APPROACH;  Surgeon: Mcarthur Rossetti, MD;  Location: [facility];  Service: Orthopedics;  Laterality:  Right;  . TRANSTHORACIC ECHOCARDIOGRAM  09/07/2013   Done to evaluate TIA: Normal LV size and function. EF 55-60%. GR 1 DD. Mild left atrial dilation. Mildly calcified mitral leaflets. Otherwise normal.    There were no vitals filed for this visit.  Subjective Assessment - 04/21/20 1105    Subjective  "My R hip is much better, previously it was still doing pretty but got some popping"    Currently in Pain?  Yes    Pain Score  0-No pain   L hip is 6-7/10. last took meds for pain 1 hour before   Pain Location  Hip    Pain Orientation  Right;Left    Pain Descriptors / Indicators  Aching    Pain Type  Chronic pain    Pain Onset  More than a month ago    Pain Frequency  Intermittent    Aggravating Factors   moving, lifting,         [facility] PT Assessment - 04/21/20 0001      Assessment   Medical Diagnosis  Status post right hip replacement     Referring Provider (PT)  Jean Rosenthal MD      AROM   Right Hip Extension  3      Strength   Right Hip Flexion  4/5    Right Hip Extension  4-/5                   [facility] Adult PT Treatment/Exercise - 04/21/20 0001      Knee/Hip Exercises: Stretches   Other Knee/Hip Stretches  warrior pose seated 2 x 30 for R hip              PT Education - 04/21/20 1138    Education Details  Reviewed HEP and discussed importance of consistency with stretching and strenghtening. updated HEP for warrior pose in chair to promote stretching of the pectineus. reviewed muscle anatomy of the anterior hip which could contribute to popping. Discussed POC since she is getting her L hip replaced on 5/7.    Person(s) Educated  Patient    Methods  Explanation;Verbal cues;Handout    Comprehension  Verbalized understanding;Verbal cues required       PT Short Term Goals - 04/21/20 1120      PT SHORT TERM GOAL #1   Title  pt to be I with inital HEP    Period  Weeks    Status  Achieved      PT SHORT TERM GOAL #2   Title  pt to report  reduced popping sensation to ,</= 2 x a week to promote improvement in condition    Baseline  04/21/2020  happens about 1 x a day    Period  Weeks    Status  Partially Met        PT Long Term Goals - 04/21/20 1121  PT LONG TERM GOAL #1   Title  increase R gross hip strength to >/= 4+/5 to promote hip stability    Baseline  4/5    Period  Weeks    Status  Partially Met      PT LONG TERM GOAL #2   Title  increase R hip extension to >/= 6 degrees to promote efficient gait pattern    Baseline  4 degrees of extension    Period  Weeks    Status  Partially Met      PT LONG TERM GOAL #3   Title  pt to be able to walk >/= 1 mile with no report of pain or limtations per the R hip for functional endurance    Period  Weeks    Status  Achieved      PT LONG TERM GOAL #4   Title  Increase FOTO score to </= 44% limited to demo improvement in function    Period  Weeks    Status  Achieved      PT LONG TERM GOAL #5   Title  pt to be I with all HEP as of last visit to maintain and progress current level of function    Period  Weeks            Plan - 04/21/20 1140    Clinical Impression Statement  Brenda Carney has made great progress with phyiscla therapy increasing hip ROM and strength and additionaly reportd significantly reduced "subluxation" sensation. She met or partially met all goals today. she continues to report mild intermittnet soreness and popping that occurs 1-2 x a day. pt is scheduled for Pre-admit testing on 4/30 and is planning to have her L hip replaced on 5/7, and due to COVID restrictions regarding quarantine following pre-admit testing plan to discharge her from PT today.    PT Next Visit Plan  D/C    PT Home Exercise Plan  [phone] - hip flexor stretch (supine /standing), bridges, sidelying hip abduction, lower trunk rotation. added supine clam red band, prone hip extension. seated warrior pose.    Consulted and Agree with Plan of Care  Patient       Patient will  benefit from skilled therapeutic intervention in order to improve the following deficits and impairments:  Improper body mechanics, Increased muscle spasms, Decreased strength, Pain, Postural dysfunction, Decreased activity tolerance, Decreased endurance, Decreased balance, Decreased range of motion  Visit Diagnosis: Pain in left hip  Stiffness of right hip, not elsewhere classified  Muscle weakness (generalized)  Other abnormalities of gait and mobility     Problem List Patient Active Problem List   Diagnosis Date Noted  . Avascular necrosis of bone of left hip ([place]) 04/15/2020  . Status post total replacement of right hip 01/16/2020  . Avascular necrosis of bone of right hip ([place]) 01/06/2020  . CAP (community acquired pneumonia) 06/17/2019  . Leukocytosis 06/17/2019  . Hypoxia   . Sepsis ([place]) 04/29/2018  . Hematemesis 04/29/2018  . Hypokalemia 04/29/2018  . AKI (acute kidney injury) ([place]) 04/29/2018  . Acute respiratory failure with hypoxia ([place]) 04/29/2018  . Thoracic aortic ectasia ([place])   . Sleep apnea   . Pneumonia   . Migraine headache   . Lumbosacral spondylosis   . Hx of multiple concussions   . History of kidney stones   . History of IBS   . GERD (gastroesophageal reflux disease)   . Exercise-induced asthma with acute exacerbation   . Depression   .  COPD with acute exacerbation ([place])   . Chronic lower back pain   . Cervical spondylosis   . Cerebral aneurysm   . Arthritis   . Anemia   . Coronary artery disease, non-occlusive 06/28/2017  . Critical lower limb ischemia 05/14/2017  . Preoperative cardiovascular examination   . PAD (peripheral artery disease) ([place])   . S/P lumbar spinal fusion 09/30/2014  . Essential hypertension   . Hyperlipidemia with target LDL less than 70   . Cerebrovascular disease   . Headache(784.0) 10/07/2013  . TIA (transient ischemic attack) 09/05/2013  . Tobacco abuse 09/05/2013    [place] 04/21/2020, 11:44  AM  [facility] [address] [place], [place], [address] Phone: [phone]   Fax:  [phone]  Name: Brenda Carney MRN: [id number] Date of Birth: 14-May-1951      PHYSICAL THERAPY DISCHARGE SUMMARY  Visits from Start of Care: 4  Current functional level related to goals / functional outcomes: See goals, FOTO 35% limited   Remaining deficits: Intermittent R hip soreness and popping 1 x day. Pt notes majority of her pain/ stiffness and weakness are related to the L hip   Education / Equipment: HEP, theraband, posture, muscle anatomy.   Plan: Patient agrees to discharge.  Patient goals were partially met. Patient is being discharged due to being pleased with the current functional level.  ????? and is planning to get L hip replaced and has pre-admit testing on 4/30 and current covid restrictions she must quarantine until the surgery.            Kiam Bransfield PT, DPT, LAT, ATC  04/21/20  11:46 AM

## 2020-04-22 DIAGNOSIS — M87059 Idiopathic aseptic necrosis of unspecified femur: Secondary | ICD-10-CM | POA: Diagnosis not present

## 2020-04-22 DIAGNOSIS — I10 Essential (primary) hypertension: Secondary | ICD-10-CM | POA: Diagnosis not present

## 2020-04-22 DIAGNOSIS — I719 Aortic aneurysm of unspecified site, without rupture: Secondary | ICD-10-CM | POA: Diagnosis not present

## 2020-04-22 DIAGNOSIS — E782 Mixed hyperlipidemia: Secondary | ICD-10-CM | POA: Diagnosis not present

## 2020-04-22 DIAGNOSIS — G894 Chronic pain syndrome: Secondary | ICD-10-CM | POA: Diagnosis not present

## 2020-04-22 DIAGNOSIS — I739 Peripheral vascular disease, unspecified: Secondary | ICD-10-CM | POA: Diagnosis not present

## 2020-04-22 DIAGNOSIS — J449 Chronic obstructive pulmonary disease, unspecified: Secondary | ICD-10-CM | POA: Diagnosis not present

## 2020-04-22 NOTE — Patient Instructions (Addendum)
DUE TO COVID-19 ONLY ONE VISITOR IS ALLOWED TO COME WITH YOU AND STAY IN THE WAITING ROOM ONLY DURING PRE OP AND PROCEDURE DAY OF SURGERY. THE 2 VISITORS MAY VISIT WITH YOU AFTER SURGERY IN YOUR PRIVATE ROOM DURING VISITING HOURS ONLY!  YOU NEED TO HAVE A COVID 19 TEST ON_5/4______ @__10 :30_____, THIS TEST MUST BE DONE BEFORE SURGERY, COME  [address], [place] , [address].  ([place]) ONCE YOUR COVID TEST IS COMPLETED, PLEASE BEGIN THE QUARANTINE INSTRUCTIONS AS OUTLINED IN YOUR HANDOUT.                Brenda Carney    Your procedure is scheduled on: 04/30/20   Report to [facility] Main  Entrance   Report to admitting at  9:45 AM     Call this number if you have problems the morning of surgery [phone]    Remember: Do not eat food or drink liquids :After Midnight.   BRUSH YOUR TEETH MORNING OF SURGERY AND RINSE YOUR MOUTH OUT, NO CHEWING GUM CANDY OR MINTS.   Do not eat food After Midnight.        Take these medicines the morning of surgery with A SIP OF WATER:  Wellbutrin, Pristiq, Abilify, Amlodipine, use eye drops, use inhalers and bring them with you to the hospital                                 You may not have any metal on your body including hair pins and              piercings  Do not wear jewelry, make-up, lotions, powders or perfumes, deodorant             Do not wear nail polish on your fingernails.  Do not shave  48 hours prior to surgery.  .   Do not bring valuables to the hospital. [place].  Contacts, dentures or bridgework may not be worn into surgery.        Name and phone number of your driver:  Special Instructions: N/A              Please read over the following fact sheets you were given: _____________________________________________________________________             [facility] - Preparing for Surgery Before surgery, you can play an important role.    Because skin is not sterile, your skin needs to be as free of germs as possible.   You can reduce the number of germs on your skin by washing with CHG (chlorahexidine gluconate) soap before surgery.   CHG is an antiseptic cleaner which kills germs and bonds with the skin to continue killing germs even after washing. Please DO NOT use if you have an allergy to CHG or antibacterial soaps.   If your skin becomes reddened/irritated stop using the CHG and inform your nurse when you arrive at Short Stay. Do not shave (including legs and underarms) for at least 48 hours prior to the first CHG shower.    Please follow these instructions carefully:  1.  Shower with CHG Soap the night before surgery and the  morning of Surgery.  2.  If you choose to wash your hair, wash your hair first as usual with your  normal  shampoo.  3.  After you shampoo, rinse your hair and body thoroughly to remove the  shampoo.                                        4.  Use CHG as you would any other liquid soap.  You can apply chg directly  to the skin and wash                       Gently with a scrungie or clean washcloth.  5.  Apply the CHG Soap to your body ONLY FROM THE NECK DOWN.   Do not use on face/ open                           Wound or open sores. Avoid contact with eyes, ears mouth and genitals (private parts).                       Wash face,  Genitals (private parts) with your normal soap.             6.  Wash thoroughly, paying special attention to the area where your surgery  will be performed.  7.  Thoroughly rinse your body with warm water from the neck down.  8.  DO NOT shower/wash with your normal soap after using and rinsing off  the CHG Soap.             9.  Pat yourself dry with a clean towel.            10.  Wear clean pajamas.            11.  Place clean sheets on your bed the night of your first shower and do not  sleep with pets. Day of Surgery : Do not apply any lotions/deodorants the morning of  surgery.  Please wear clean clothes to the hospital/surgery center.  FAILURE TO FOLLOW THESE INSTRUCTIONS MAY RESULT IN THE CANCELLATION OF YOUR SURGERY PATIENT SIGNATURE_________________________________  NURSE SIGNATURE__________________________________  ________________________________________________________________________   Adam [place]ix  An incentive spirometer is a tool that can help keep your lungs clear and active. This tool measures how well you are filling your lungs with each breath. Taking long deep breaths may help reverse or decrease the chance of developing breathing (pulmonary) problems (especially infection) following:  A long period of time when you are unable to move or be active. BEFORE THE PROCEDURE   If the spirometer includes an indicator to show your best effort, your nurse or respiratory therapist will set it to a desired goal.  If possible, sit up straight or lean slightly forward. Try not to slouch.  Hold the incentive spirometer in an upright position. INSTRUCTIONS FOR USE  1. Sit on the edge of your bed if possible, or sit up as far as you can in bed or on a chair. 2. Hold the incentive spirometer in an upright position. 3. Breathe out normally. 4. Place the mouthpiece in your mouth and seal your lips tightly around it. 5. Breathe in slowly and as deeply as possible, raising the piston or the ball toward the top of the column. 6. Hold your breath for 3-5 seconds or for as long as possible. Allow the piston or ball to fall to the bottom of the  column. 7. Remove the mouthpiece from your mouth and breathe out normally. 8. Rest for a few seconds and repeat Steps 1 through 7 at least 10 times every 1-2 hours when you are awake. Take your time and take a few normal breaths between deep breaths. 9. The spirometer may include an indicator to show your best effort. Use the indicator as a goal to work toward during each repetition. 10. After each set of 10  deep breaths, practice coughing to be sure your lungs are clear. If you have an incision (the cut made at the time of surgery), support your incision when coughing by placing a pillow or rolled up towels firmly against it. Once you are able to get out of bed, walk around indoors and cough well. You may stop using the incentive spirometer when instructed by your caregiver.  RISKS AND COMPLICATIONS  Take your time so you do not get dizzy or light-headed.  If you are in pain, you may need to take or ask for pain medication before doing incentive spirometry. It is harder to take a deep breath if you are having pain. AFTER USE  Rest and breathe slowly and easily.  It can be helpful to keep track of a log of your progress. Your caregiver can provide you with a simple table to help with this. If you are using the spirometer at home, follow these instructions: [place] IF:   You are having difficultly using the spirometer.  You have trouble using the spirometer as often as instructed.  Your pain medication is not giving enough relief while using the spirometer.  You develop fever of 100.5 F (38.1 C) or higher. SEEK IMMEDIATE MEDICAL CARE IF:   You cough up bloody sputum that had not been present before.  You develop fever of 102 F (38.9 C) or greater.  You develop worsening pain at or near the incision site. MAKE SURE YOU:   Understand these instructions.  Will watch your condition.  Will get help right away if you are not doing well or get worse. Document Released: 04/23/2007 Document Revised: 03/04/2012 Document Reviewed: 06/24/2007 [facility] Patient Information 2014 [place], [place].   ________________________________________________________________________

## 2020-04-23 ENCOUNTER — Other Ambulatory Visit: Payer: Self-pay

## 2020-04-23 ENCOUNTER — Encounter (HOSPITAL_COMMUNITY)
Admission: RE | Admit: 2020-04-23 | Discharge: 2020-04-23 | Disposition: A | Discharge status: Home / Self Care | Payer: Medicare Other | Source: Ambulatory Visit | Attending: Orthopaedic Surgery | Admitting: Orthopaedic Surgery

## 2020-04-23 ENCOUNTER — Encounter (HOSPITAL_COMMUNITY): Payer: Self-pay

## 2020-04-23 DIAGNOSIS — I2510 Atherosclerotic heart disease of native coronary artery without angina pectoris: Secondary | ICD-10-CM | POA: Insufficient documentation

## 2020-04-23 DIAGNOSIS — I10 Essential (primary) hypertension: Secondary | ICD-10-CM | POA: Diagnosis not present

## 2020-04-23 DIAGNOSIS — J449 Chronic obstructive pulmonary disease, unspecified: Secondary | ICD-10-CM | POA: Diagnosis not present

## 2020-04-23 DIAGNOSIS — G4730 Sleep apnea, unspecified: Secondary | ICD-10-CM | POA: Insufficient documentation

## 2020-04-23 DIAGNOSIS — Z01812 Encounter for preprocedural laboratory examination: Secondary | ICD-10-CM | POA: Diagnosis not present

## 2020-04-23 DIAGNOSIS — F17210 Nicotine dependence, cigarettes, uncomplicated: Secondary | ICD-10-CM | POA: Insufficient documentation

## 2020-04-23 DIAGNOSIS — E785 Hyperlipidemia, unspecified: Secondary | ICD-10-CM | POA: Insufficient documentation

## 2020-04-23 DIAGNOSIS — Z8673 Personal history of transient ischemic attack (TIA), and cerebral infarction without residual deficits: Secondary | ICD-10-CM | POA: Insufficient documentation

## 2020-04-23 LAB — SURGICAL PCR SCREEN
MRSA, PCR: NEGATIVE
Staphylococcus aureus: NEGATIVE

## 2020-04-23 LAB — BASIC METABOLIC PANEL
Anion gap: 11 (ref 5–15)
BUN: 11 mg/dL (ref 8–23)
CO2: 25 mmol/L (ref 22–32)
Calcium: 9.3 mg/dL (ref 8.9–10.3)
Chloride: 102 mmol/L (ref 98–111)
Creatinine, Ser: 0.89 mg/dL (ref 0.44–1.00)
GFR calc Af Amer: >60 mL/min (ref >60)
GFR calc non Af Amer: >60 mL/min (ref >60)
Glucose, Bld: 100 mg/dL — ABNORMAL HIGH (ref 70–99)
Potassium: 3.2 mmol/L — ABNORMAL LOW (ref 3.5–5.1)
Sodium: 138 mmol/L (ref 135–145)

## 2020-04-23 LAB — CBC
HCT: 43 % (ref 36.0–46.0)
Hemoglobin: 14.8 g/dL (ref 12.0–15.0)
MCH: 33 pg (ref 26.0–34.0)
MCHC: 34.4 g/dL (ref 30.0–36.0)
MCV: 96 fL (ref 80.0–100.0)
Platelets: 385 10*3/uL (ref 150–400)
RBC: 4.48 MIL/uL (ref 3.87–5.11)
RDW: 13.4 % (ref 11.5–15.5)
WBC: 10.1 10*3/uL (ref 4.0–10.5)
nRBC: 0 % (ref 0.0–0.2)

## 2020-04-23 NOTE — Progress Notes (Signed)
PCP - Dr. Fara Olden Cardiologist - Dr. Ellyn Hack  Chest x-ray - 07/24/19 EKG - 01/08/20 Stress Test -2018  ECHO - 2014 Cardiac Cath - no  Sleep Study - yes CPAP - no  Fasting Blood Sugar - NA Checks Blood Sugar _____ times a day  Blood Thinner Instructions:ASA 81 Aspirin Instructions:none Last Dose:04/25/20  Anesthesia review:   Patient denies shortness of breath, fever, cough and chest pain at PAT appointment yes  Patient verbalized understanding of instructions that were given to them at the PAT appointment. Patient was also instructed that they will need to review over the PAT instructions again at home before surgery. yes

## 2020-04-24 LAB — ABO/RH: ABO/RH(D): A POS

## 2020-04-26 ENCOUNTER — Other Ambulatory Visit: Payer: Self-pay | Admitting: Physician Assistant

## 2020-04-27 ENCOUNTER — Other Ambulatory Visit (HOSPITAL_COMMUNITY)
Admission: RE | Admit: 2020-04-27 | Discharge: 2020-04-27 | Disposition: A | Discharge status: Home / Self Care | Payer: Medicare Other | Source: Ambulatory Visit | Attending: Orthopaedic Surgery | Admitting: Orthopaedic Surgery

## 2020-04-27 DIAGNOSIS — Z01812 Encounter for preprocedural laboratory examination: Secondary | ICD-10-CM | POA: Diagnosis not present

## 2020-04-27 DIAGNOSIS — Z20822 Contact with and (suspected) exposure to covid-19: Secondary | ICD-10-CM | POA: Diagnosis not present

## 2020-04-27 LAB — SARS CORONAVIRUS 2 (TAT 6-24 HRS): SARS Coronavirus 2: NEGATIVE

## 2020-04-29 NOTE — Progress Notes (Signed)
Pt called about time change.

## 2020-04-29 NOTE — H&P (Signed)
TOTAL HIP ADMISSION H&P  Patient is admitted for left total hip arthroplasty.  Subjective:  Chief Complaint: left hip pain  HPI: Brenda Carney, 69 y.o. female, has a history of pain and functional disability in the left hip(s) due to avascular necrosis and patient has failed non-surgical conservative treatments for greater than 12 weeks to include NSAID's and/or analgesics, flexibility and strengthening excercises, supervised PT with diminished ADL's post treatment, use of assistive devices and activity modification.  Onset of symptoms was abrupt starting 1 years ago with rapidlly worsening course since that time.The patient noted no past surgery on the left hip(s).  Patient currently rates pain in the left hip at 10 out of 10 with activity. Patient has night pain, worsening of pain with activity and weight bearing, pain that interfers with activities of daily living and pain with passive range of motion. Patient has evidence of subchondral cysts and joint space narrowing by imaging studies. This condition presents safety issues increasing the risk of falls.  There is no current active infection.  Patient Active Problem List   Diagnosis Date Noted  . Avascular necrosis of bone of left hip ([place]) 04/15/2020  . Status post total replacement of right hip 01/16/2020  . Avascular necrosis of bone of right hip ([place]) 01/06/2020  . CAP (community acquired pneumonia) 06/17/2019  . Leukocytosis 06/17/2019  . Hypoxia   . Sepsis ([place]) 04/29/2018  . Hematemesis 04/29/2018  . Hypokalemia 04/29/2018  . AKI (acute kidney injury) ([place]) 04/29/2018  . Acute respiratory failure with hypoxia ([place]) 04/29/2018  . Thoracic aortic ectasia ([place])   . Sleep apnea   . Pneumonia   . Migraine headache   . Lumbosacral spondylosis   . Hx of multiple concussions   . History of kidney stones   . History of IBS   . GERD (gastroesophageal reflux disease)   . Exercise-induced asthma with acute exacerbation   .  Depression   . COPD with acute exacerbation ([place])   . Chronic lower back pain   . Cervical spondylosis   . Cerebral aneurysm   . Arthritis   . Anemia   . Coronary artery disease, non-occlusive 06/28/2017  . Critical lower limb ischemia 05/14/2017  . Preoperative cardiovascular examination   . PAD (peripheral artery disease) ([place])   . S/P lumbar spinal fusion 09/30/2014  . Essential hypertension   . Hyperlipidemia with target LDL less than 70   . Cerebrovascular disease   . Headache(784.0) 10/07/2013  . TIA (transient ischemic attack) 09/05/2013  . Tobacco abuse 09/05/2013   Past Medical History:  Diagnosis Date  . Arthritis    "thumbs; hips" (05/14/2017)  . 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 Cerebrovascular disease    Carotid dopplers which showed no significant increase in velocities, extremely minimal on the left, antegrade vertebral bilaterally.  . Cervical spondylosis   . Chronic lower back pain   . COPD (chronic obstructive pulmonary disease) ([place])    "little bit" (05/14/2017)  . Coronary artery disease, non-occlusive    Coronary calcium scoring: June 2018: Score 1106. 98 percentile for age -[place]  . Depression   . Exercise-induced asthma with acute exacerbation    "only in very hot weather" (05/14/2017)  . GERD (gastroesophageal reflux disease)   . History of IBS    resovlved  . 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 Hyperlipidemia with target LDL  less than 70    Mostly statin intolerant,. Currently on Livalo 4 mg  . Hypertension, benign   . Lumbosacral spondylosis   . PAD (peripheral artery disease) ([facility])    Status post right above-the-knee-below the knee popliteal artery bypass; postop right ABI 0.96.  Marland Kitchen Pneumonia 2020  . Sleep apnea    does not wear CPAP; "think it was medication related" (05/14/2017)  . Stroke [facility]) 2010   TIA  . Thoracic aortic ectasia  (H[facility])    ~40 mm on Coronary Calcium Score CT - recommend f/u CTA or MRA  . TIA (transient ischemic attack) 09/2011   "several"    Past Surgical History:  Procedure Laterality Date  . ABDOMINAL AORTOGRAM W/LOWER EXTREMITY N/A 05/14/2017   Procedure: Abdominal Aortogram w/Lower Extremity;  Surgeon: Conrad L[place]onville, MD;  Location: [facility];  Service: Cardiovascular;  Laterality: N/A;  . ACROMIO-CLAVICULAR JOINT REPAIR Left 1990s   "I think it was called an AC joint removal"  . BACK SURGERY    . BYPASS GRAFT POPLITEAL TO POPLITEAL Right 05/15/2017   Procedure: BYPASS GRAFT ABOVE KNEE POPLITEAL TO BELOW KNEE POPLITEAL ARTERY;  Surgeon: Conrad Manchester, MD;  Location: [facility] OR;  Service: Vascular;  Laterality: Right;  . ESOPHAGOGASTRODUODENOSCOPY (EGD) WITH PROPOFOL N/A 04/29/2018   Procedure: ESOPHAGOGASTRODUODENOSCOPY (EGD) WITH PROPOFOL;  Surgeon: Otis Brace, MD;  Location: [facility];  Service: Gastroenterology;  Laterality: N/A;  . FOOT FRACTURE SURGERY Bilateral    multiple procedures  . FRACTURE SURGERY    . JOINT REPLACEMENT    . MAXIMUM ACCESS (MAS)POSTERIOR LUMBAR INTERBODY FUSION (PLIF) 1 LEVEL N/A 09/30/2014   Procedure: FOR MAXIMUM ACCESS (MAS) POSTERIOR LUMBAR INTERBODY FUSION (PLIF) 1 LEVEL;  Surgeon: Eustace Moore, MD;  Location: [facility];  Service: Neurosurgery;  Laterality: N/A;  FOR MAXIMUM ACCESS (MAS) POSTERIOR LUMBAR INTERBODY FUSION (PLIF) 1 LEVEL LUMBAR 4-5  . MULTIPLE TOOTH EXTRACTIONS Right 04/2017  . NM MYOVIEW LTD  06/2017    Normal EF, 66%. No ST changes. Normal study. LOW RISK.  Marland Kitchen TOTAL HIP ARTHROPLASTY Right 01/16/2020   Procedure: RIGHT TOTAL HIP ARTHROPLASTY ANTERIOR APPROACH;  Surgeon: Mcarthur Rossetti, MD;  Location: [facility];  Service: Orthopedics;  Laterality: Right;  . TRANSTHORACIC ECHOCARDIOGRAM  09/07/2013   Done to evaluate TIA: Normal LV size and function. EF 55-60%. GR 1 DD. Mild left atrial dilation. Mildly calcified mitral leaflets.  Otherwise normal.    No current facility-administered medications for this encounter.   Current Outpatient Medications  Medication Sig Dispense Refill Last Dose  . albuterol (PROVENTIL) (2.5 MG/3ML) 0.083% nebulizer solution Take 3 mLs (2.5 mg total) by nebulization 2 (two) times daily as needed for wheezing or shortness of breath. 75 mL 0   . albuterol (VENTOLIN HFA) 108 (90 Base) MCG/ACT inhaler Inhale 2 puffs into the lungs every 4 (four) hours as needed for wheezing or shortness of breath.     Marland Kitchen amLODipine (NORVASC) 10 MG tablet Take 1 tablet (10 mg total) by mouth daily. 30 tablet 0   . ANORO ELLIPTA 62.5-25 MCG/INH AEPB Inhale 2 puffs into the lungs daily as needed (wheezing).      . ARIPiprazole (ABILIFY) 2 MG tablet Take 2 mg by mouth daily.     . Ascorbic Acid (VITAMIN C) 1000 MG tablet Take 1,000 mg by mouth daily.     Marland Kitchen aspirin 81 MG chewable tablet Chew 1 tablet (81 mg total) by mouth 2 (two) times daily. (Patient taking differently:  Chew 81 mg by mouth daily. ) 30 tablet 0   . b complex vitamins tablet Take 1 tablet by mouth daily.     . Biotin w/ Vitamins C & E (HAIR/SKIN/NAILS PO) Take 1 tablet by mouth daily. Viviscal     . buPROPion (WELLBUTRIN XL) 150 MG 24 hr tablet Take 150 mg by mouth daily.  1   . Calcium Carb-Cholecalciferol (CALTRATE 600+D3 PO) Take 1 tablet by mouth daily before breakfast.     . desvenlafaxine (PRISTIQ) 100 MG 24 hr tablet Take 100 mg by mouth daily.     . Estradiol 10 MCG TABS vaginal tablet Place 10 mcg vaginally 2 (two) times a week.     . Evolocumab (REPATHA SURECLICK) [id number] MG/ML SOAJ Inject 140 mg into the skin every 14 (fourteen) days. 2 mL 11   . lisinopril (ZESTRIL) 10 MG tablet Take 10 mg by mouth daily.     . Multiple Vitamin (MULTIVITAMIN WITH MINERALS) TABS tablet Take 1 tablet by mouth daily. One-A-Day [place]     . oxyCODONE-acetaminophen (PERCOCET) 7.5-325 MG tablet Take 1 tablet by mouth 4 (four) times daily as needed for pain.     .  prazosin (MINIPRESS) 1 MG capsule Take 1 mg by mouth at bedtime.     Marland Kitchen Propylene Glycol-Glycerin (SOOTHE OP) Place 1 drop into both eyes 3 (three) times daily as needed (for dry eyes).      . traZODone (DESYREL) 50 MG tablet Take 50 mg by mouth at bedtime.     . Vitamin D3 (VITAMIN D) 25 MCG tablet Take 1 Units by mouth daily.     Marland Kitchen zolpidem (AMBIEN) 10 MG tablet Take 10 mg by mouth at bedtime. Take 15 mg by mouth at bedtime and an additional 5 mg if still unable to sleep after an hour      . oxyCODONE (OXY IR/ROXICODONE) 5 MG immediate release tablet Take 1-2 tablets (5-10 mg total) by mouth every 4 (four) hours as needed for moderate pain (pain score 4-6). (Patient not taking: Reported on 04/20/2020) 30 tablet 0 Not Taking at Unknown time   Allergies  Allergen Reactions  . Penicillins Swelling and Other (See Comments)    TONGUE SWELLING  PATIENT HAD A PCN REACTION WITH IMMEDIATE RASH, FACIAL/TONGUE/THROAT SWELLING, SOB, OR LIGHTHEADEDNESS WITH HYPOTENSION:  #  #  #  YES  #  #  #   Has patient had a PCN reaction causing severe rash involving mucus membranes or skin necrosis: No Has patient had a PCN reaction that required hospitalization: No Has patient had a PCN reaction occurring within the last 10 years: No   . Statins     muscle pain  . Lyrica [Pregabalin] Other (See Comments)    DRY MOUTH    Social History   Tobacco Use  . Smoking status: Heavy Tobacco Smoker    Packs/day: 0.02    Years: 36.00    Pack years: 0.72    Types: Cigarettes    Last attempt to quit: 04/24/2017    Years since quitting: 3.0  . Smokeless tobacco: Never Used  Substance Use Topics  . Alcohol use: Yes    Alcohol/week: 3.0 standard drinks    Types: 3 Glasses of wine per week    Family History  Problem Relation Age of Onset  . Stroke Father   . Heart attack Mother   . Stroke Mother      Review of Systems  All other systems reviewed and are  negative.   Objective:  Physical Exam  Constitutional:  She is oriented to person, place, and time. She appears well-developed and well-nourished.  HENT:  Head: Normocephalic and atraumatic.  Eyes: Pupils are equal, round, and reactive to light. EOM are normal.  Cardiovascular: Normal rate.  Respiratory: Effort normal.  GI: Soft.  Musculoskeletal:     Cervical back: Normal range of motion and neck supple.     Left hip: Tenderness and bony tenderness present. Decreased strength.  Neurological: She is alert and oriented to person, place, and time.  Skin: Skin is warm and dry.  Psychiatric: She has a normal mood and affect.    Vital signs in last 24 hours:    Labs:   Estimated body mass index is 23.89 kg/m as calculated from the following:   Height as of 04/23/20: 5\' 5"  (1.651 m).   Weight as of 04/23/20: 65.1 kg.   Imaging Review Plain radiographs demonstrate severe avascular necrosis the left hip(s). The bone quality appears to be good for age and reported activity level.      Assessment/Plan:  End stage avascular necosis, left hip(s)  The patient history, physical examination, clinical judgement of the provider and imaging studies are consistent with end stage AVN of the left hip(s) and total hip arthroplasty is deemed medically necessary. The treatment options including medical management, injection therapy, arthroscopy and arthroplasty were discussed at length. The risks and benefits of total hip arthroplasty were presented and reviewed. The risks due to aseptic loosening, infection, stiffness, dislocation/subluxation,  thromboembolic complications and other imponderables were discussed.  The patient acknowledged the explanation, agreed to proceed with the plan and consent was signed. Patient is being admitted for inpatient treatment for surgery, pain control, PT, OT, prophylactic antibiotics, VTE prophylaxis, progressive ambulation and ADL's and discharge planning.The patient is planning to be discharged home with home health  services

## 2020-04-30 ENCOUNTER — Encounter (HOSPITAL_COMMUNITY)
Admission: RE | Disposition: A | Discharge status: Home Health | Payer: Self-pay | Source: Home / Self Care | Attending: Orthopaedic Surgery

## 2020-04-30 ENCOUNTER — Ambulatory Visit (HOSPITAL_COMMUNITY): Payer: Medicare Other

## 2020-04-30 ENCOUNTER — Observation Stay (HOSPITAL_COMMUNITY): Payer: Medicare Other

## 2020-04-30 ENCOUNTER — Encounter (HOSPITAL_COMMUNITY): Payer: Self-pay | Admitting: Orthopaedic Surgery

## 2020-04-30 ENCOUNTER — Ambulatory Visit (HOSPITAL_COMMUNITY): Payer: Medicare Other | Admitting: Certified Registered"

## 2020-04-30 ENCOUNTER — Observation Stay (HOSPITAL_COMMUNITY)
Admission: RE | Admit: 2020-04-30 | Discharge: 2020-05-01 | Disposition: A | Discharge status: Home Health | Payer: Medicare Other | Attending: Orthopaedic Surgery | Admitting: Orthopaedic Surgery

## 2020-04-30 ENCOUNTER — Ambulatory Visit (HOSPITAL_COMMUNITY): Payer: Medicare Other | Admitting: Physician Assistant

## 2020-04-30 ENCOUNTER — Other Ambulatory Visit: Payer: Self-pay

## 2020-04-30 DIAGNOSIS — K589 Irritable bowel syndrome without diarrhea: Secondary | ICD-10-CM | POA: Insufficient documentation

## 2020-04-30 DIAGNOSIS — M1990 Unspecified osteoarthritis, unspecified site: Secondary | ICD-10-CM | POA: Diagnosis not present

## 2020-04-30 DIAGNOSIS — I77810 Thoracic aortic ectasia: Secondary | ICD-10-CM | POA: Insufficient documentation

## 2020-04-30 DIAGNOSIS — M87052 Idiopathic aseptic necrosis of left femur: Secondary | ICD-10-CM | POA: Diagnosis not present

## 2020-04-30 DIAGNOSIS — Z7982 Long term (current) use of aspirin: Secondary | ICD-10-CM | POA: Insufficient documentation

## 2020-04-30 DIAGNOSIS — E119 Type 2 diabetes mellitus without complications: Secondary | ICD-10-CM | POA: Insufficient documentation

## 2020-04-30 DIAGNOSIS — M879 Osteonecrosis, unspecified: Principal | ICD-10-CM | POA: Insufficient documentation

## 2020-04-30 DIAGNOSIS — E785 Hyperlipidemia, unspecified: Secondary | ICD-10-CM | POA: Insufficient documentation

## 2020-04-30 DIAGNOSIS — M545 Low back pain: Secondary | ICD-10-CM | POA: Diagnosis not present

## 2020-04-30 DIAGNOSIS — Z79899 Other long term (current) drug therapy: Secondary | ICD-10-CM | POA: Insufficient documentation

## 2020-04-30 DIAGNOSIS — F17210 Nicotine dependence, cigarettes, uncomplicated: Secondary | ICD-10-CM | POA: Insufficient documentation

## 2020-04-30 DIAGNOSIS — I10 Essential (primary) hypertension: Secondary | ICD-10-CM | POA: Insufficient documentation

## 2020-04-30 DIAGNOSIS — I2510 Atherosclerotic heart disease of native coronary artery without angina pectoris: Secondary | ICD-10-CM | POA: Insufficient documentation

## 2020-04-30 DIAGNOSIS — G43909 Migraine, unspecified, not intractable, without status migrainosus: Secondary | ICD-10-CM | POA: Diagnosis not present

## 2020-04-30 DIAGNOSIS — K219 Gastro-esophageal reflux disease without esophagitis: Secondary | ICD-10-CM | POA: Diagnosis not present

## 2020-04-30 DIAGNOSIS — Z419 Encounter for procedure for purposes other than remedying health state, unspecified: Secondary | ICD-10-CM

## 2020-04-30 DIAGNOSIS — G8929 Other chronic pain: Secondary | ICD-10-CM | POA: Insufficient documentation

## 2020-04-30 DIAGNOSIS — Z471 Aftercare following joint replacement surgery: Secondary | ICD-10-CM | POA: Diagnosis not present

## 2020-04-30 DIAGNOSIS — Z981 Arthrodesis status: Secondary | ICD-10-CM | POA: Insufficient documentation

## 2020-04-30 DIAGNOSIS — Z96642 Presence of left artificial hip joint: Secondary | ICD-10-CM | POA: Diagnosis not present

## 2020-04-30 DIAGNOSIS — G4730 Sleep apnea, unspecified: Secondary | ICD-10-CM | POA: Diagnosis not present

## 2020-04-30 DIAGNOSIS — F329 Major depressive disorder, single episode, unspecified: Secondary | ICD-10-CM | POA: Diagnosis not present

## 2020-04-30 DIAGNOSIS — J449 Chronic obstructive pulmonary disease, unspecified: Secondary | ICD-10-CM | POA: Diagnosis not present

## 2020-04-30 DIAGNOSIS — M25552 Pain in left hip: Secondary | ICD-10-CM | POA: Diagnosis not present

## 2020-04-30 DIAGNOSIS — Z96641 Presence of right artificial hip joint: Secondary | ICD-10-CM | POA: Insufficient documentation

## 2020-04-30 DIAGNOSIS — I639 Cerebral infarction, unspecified: Secondary | ICD-10-CM | POA: Diagnosis not present

## 2020-04-30 HISTORY — PX: TOTAL HIP ARTHROPLASTY: SHX124

## 2020-04-30 LAB — TYPE AND SCREEN
ABO/RH(D): A POS
Antibody Screen: NEGATIVE

## 2020-04-30 SURGERY — ARTHROPLASTY, HIP, TOTAL, ANTERIOR APPROACH
Anesthesia: Monitor Anesthesia Care | Site: Hip | Laterality: Left

## 2020-04-30 MED ORDER — MIDAZOLAM HCL 2 MG/2ML IJ SOLN
INTRAMUSCULAR | Status: DC | PRN
  Administered 2020-04-30 (×2): 1 mg via INTRAVENOUS

## 2020-04-30 MED ORDER — MENTHOL 3 MG MT LOZG: 1.0000 | LOZENGE | OROMUCOSAL | Status: DC | PRN

## 2020-04-30 MED ORDER — OXYCODONE HCL 5 MG PO TABS
5.0000 mg | ORAL_TABLET | ORAL | Status: DC | PRN
  Administered 2020-04-30 – 2020-05-01 (×4): 10 mg via ORAL
  Filled 2020-04-30 (×4): qty 2

## 2020-04-30 MED ORDER — AMLODIPINE BESYLATE 10 MG PO TABS
10.0000 mg | ORAL_TABLET | Freq: Every day | ORAL | Status: DC
  Administered 2020-04-30 – 2020-05-01 (×2): 10 mg via ORAL
  Filled 2020-04-30 (×2): qty 1

## 2020-04-30 MED ORDER — BUPIVACAINE IN DEXTROSE 0.75-8.25 % IT SOLN
INTRATHECAL | Status: DC | PRN
  Administered 2020-04-30: 1.7 mL via INTRATHECAL

## 2020-04-30 MED ORDER — SODIUM CHLORIDE 0.9 % IV SOLN: INTRAVENOUS | Status: DC

## 2020-04-30 MED ORDER — FENTANYL CITRATE (PF) 100 MCG/2ML IJ SOLN: 25.0000 ug | INTRAMUSCULAR | Status: DC | PRN

## 2020-04-30 MED ORDER — METHOCARBAMOL 500 MG PO TABS
500.0000 mg | ORAL_TABLET | Freq: Four times a day (QID) | ORAL | Status: DC | PRN
  Administered 2020-04-30: 23:00:00 500 mg via ORAL
  Filled 2020-04-30: qty 1

## 2020-04-30 MED ORDER — ADULT MULTIVITAMIN W/MINERALS CH
1.0000 | ORAL_TABLET | Freq: Every day | ORAL | Status: DC
  Administered 2020-04-30 – 2020-05-01 (×2): 1 via ORAL
  Filled 2020-04-30 (×2): qty 1

## 2020-04-30 MED ORDER — LISINOPRIL 10 MG PO TABS
10.0000 mg | ORAL_TABLET | Freq: Every day | ORAL | Status: DC
  Administered 2020-04-30 – 2020-05-01 (×2): 10 mg via ORAL
  Filled 2020-04-30 (×2): qty 1

## 2020-04-30 MED ORDER — PHENYLEPHRINE HCL-NACL 10-0.9 MG/250ML-% IV SOLN
INTRAVENOUS | Status: DC | PRN
Start: 2020-04-30 — End: 2020-04-30
  Administered 2020-04-30: 20 ug/min via INTRAVENOUS

## 2020-04-30 MED ORDER — FENTANYL CITRATE (PF) 100 MCG/2ML IJ SOLN
INTRAMUSCULAR | Status: AC
  Filled 2020-04-30: qty 2

## 2020-04-30 MED ORDER — ACETAMINOPHEN 160 MG/5ML PO SOLN: 325.0000 mg | ORAL | Status: DC | PRN

## 2020-04-30 MED ORDER — PANTOPRAZOLE SODIUM 40 MG PO TBEC
40.0000 mg | DELAYED_RELEASE_TABLET | Freq: Every day | ORAL | Status: DC
  Administered 2020-04-30 – 2020-05-01 (×2): 40 mg via ORAL
  Filled 2020-04-30 (×2): qty 1

## 2020-04-30 MED ORDER — LIDOCAINE 2% (20 MG/ML) 5 ML SYRINGE
INTRAMUSCULAR | Status: AC
  Filled 2020-04-30: qty 5

## 2020-04-30 MED ORDER — DEXAMETHASONE SODIUM PHOSPHATE 10 MG/ML IJ SOLN
INTRAMUSCULAR | Status: DC | PRN
  Administered 2020-04-30: 8 mg via INTRAVENOUS

## 2020-04-30 MED ORDER — OXYCODONE HCL 5 MG/5ML PO SOLN: 5.0000 mg | Freq: Once | ORAL | Status: DC | PRN

## 2020-04-30 MED ORDER — VITAMIN D3 25 MCG PO TABS: 1.0000 [IU] | ORAL_TABLET | Freq: Every day | ORAL | Status: DC

## 2020-04-30 MED ORDER — METOCLOPRAMIDE HCL 5 MG PO TABS: 5.0000 mg | ORAL_TABLET | Freq: Three times a day (TID) | ORAL | Status: DC | PRN

## 2020-04-30 MED ORDER — VITAMIN D 25 MCG (1000 UNIT) PO TABS
500.0000 [IU] | ORAL_TABLET | Freq: Every day | ORAL | Status: DC
  Administered 2020-04-30 – 2020-05-01 (×2): 500 [IU] via ORAL
  Filled 2020-04-30 (×2): qty 1

## 2020-04-30 MED ORDER — LACTATED RINGERS IV SOLN: INTRAVENOUS | Status: DC

## 2020-04-30 MED ORDER — ZOLPIDEM TARTRATE 5 MG PO TABS: 5.0000 mg | ORAL_TABLET | Freq: Every evening | ORAL | Status: DC | PRN

## 2020-04-30 MED ORDER — ARIPIPRAZOLE 2 MG PO TABS
2.0000 mg | ORAL_TABLET | Freq: Every day | ORAL | Status: DC
  Administered 2020-04-30 – 2020-05-01 (×2): 2 mg via ORAL
  Filled 2020-04-30 (×2): qty 1

## 2020-04-30 MED ORDER — ACETAMINOPHEN 325 MG PO TABS: 325.0000 mg | ORAL_TABLET | Freq: Four times a day (QID) | ORAL | Status: DC | PRN

## 2020-04-30 MED ORDER — ONDANSETRON HCL 4 MG/2ML IJ SOLN
INTRAMUSCULAR | Status: AC
  Filled 2020-04-30: qty 2

## 2020-04-30 MED ORDER — PROPOFOL 10 MG/ML IV BOLUS
INTRAVENOUS | Status: AC
  Filled 2020-04-30: qty 20

## 2020-04-30 MED ORDER — ONDANSETRON HCL 4 MG PO TABS: 4.0000 mg | ORAL_TABLET | Freq: Four times a day (QID) | ORAL | Status: DC | PRN

## 2020-04-30 MED ORDER — POLYETHYLENE GLYCOL 3350 17 G PO PACK: 17.0000 g | PACK | Freq: Every day | ORAL | Status: DC | PRN

## 2020-04-30 MED ORDER — BUPROPION HCL ER (XL) 150 MG PO TB24
150.0000 mg | ORAL_TABLET | Freq: Every day | ORAL | Status: DC
  Administered 2020-04-30 – 2020-05-01 (×2): 150 mg via ORAL
  Filled 2020-04-30 (×2): qty 1

## 2020-04-30 MED ORDER — DEXAMETHASONE SODIUM PHOSPHATE 10 MG/ML IJ SOLN
INTRAMUSCULAR | Status: AC
  Filled 2020-04-30: qty 1

## 2020-04-30 MED ORDER — 0.9 % SODIUM CHLORIDE (POUR BTL) OPTIME
TOPICAL | Status: DC | PRN
  Administered 2020-04-30: 1000 mL

## 2020-04-30 MED ORDER — ALBUTEROL SULFATE (2.5 MG/3ML) 0.083% IN NEBU: 2.5000 mg | INHALATION_SOLUTION | Freq: Two times a day (BID) | RESPIRATORY_TRACT | Status: DC | PRN

## 2020-04-30 MED ORDER — PROPOFOL 10 MG/ML IV BOLUS
INTRAVENOUS | Status: DC | PRN
  Administered 2020-04-30: 20 mg via INTRAVENOUS

## 2020-04-30 MED ORDER — ONDANSETRON HCL 4 MG/2ML IJ SOLN: 4.0000 mg | Freq: Once | INTRAMUSCULAR | Status: DC | PRN

## 2020-04-30 MED ORDER — TRANEXAMIC ACID-NACL 1000-0.7 MG/100ML-% IV SOLN
1000.0000 mg | INTRAVENOUS | Status: AC
  Administered 2020-04-30: 1000 mg via INTRAVENOUS
  Filled 2020-04-30: qty 100

## 2020-04-30 MED ORDER — OXYCODONE HCL 5 MG PO TABS: 5.0000 mg | ORAL_TABLET | Freq: Once | ORAL | Status: DC | PRN

## 2020-04-30 MED ORDER — HYDROMORPHONE HCL 1 MG/ML IJ SOLN: 0.5000 mg | INTRAMUSCULAR | Status: DC | PRN

## 2020-04-30 MED ORDER — MIDAZOLAM HCL 2 MG/2ML IJ SOLN
INTRAMUSCULAR | Status: AC
  Filled 2020-04-30: qty 2

## 2020-04-30 MED ORDER — GABAPENTIN 100 MG PO CAPS
100.0000 mg | ORAL_CAPSULE | Freq: Three times a day (TID) | ORAL | Status: DC
  Administered 2020-04-30 – 2020-05-01 (×3): 100 mg via ORAL
  Filled 2020-04-30 (×3): qty 1

## 2020-04-30 MED ORDER — METOCLOPRAMIDE HCL 5 MG/ML IJ SOLN: 5.0000 mg | Freq: Three times a day (TID) | INTRAMUSCULAR | Status: DC | PRN

## 2020-04-30 MED ORDER — ACETAMINOPHEN 325 MG PO TABS: 325.0000 mg | ORAL_TABLET | ORAL | Status: DC | PRN

## 2020-04-30 MED ORDER — ASPIRIN 81 MG PO CHEW
81.0000 mg | CHEWABLE_TABLET | Freq: Two times a day (BID) | ORAL | Status: DC
  Administered 2020-04-30 – 2020-05-01 (×2): 81 mg via ORAL
  Filled 2020-04-30 (×2): qty 1

## 2020-04-30 MED ORDER — CLINDAMYCIN PHOSPHATE 900 MG/50ML IV SOLN
900.0000 mg | INTRAVENOUS | Status: AC
  Administered 2020-04-30: 900 mg via INTRAVENOUS
  Filled 2020-04-30: qty 50

## 2020-04-30 MED ORDER — SODIUM CHLORIDE 0.9 % IR SOLN
Status: DC | PRN
  Administered 2020-04-30: 3000 mL

## 2020-04-30 MED ORDER — ONDANSETRON HCL 4 MG/2ML IJ SOLN: 4.0000 mg | Freq: Four times a day (QID) | INTRAMUSCULAR | Status: DC | PRN

## 2020-04-30 MED ORDER — ALUM & MAG HYDROXIDE-SIMETH 200-200-20 MG/5ML PO SUSP: 30.0000 mL | ORAL | Status: DC | PRN

## 2020-04-30 MED ORDER — POVIDONE-IODINE 10 % EX SWAB
2.0000 "application " | Freq: Once | CUTANEOUS | Status: AC
  Administered 2020-04-30: 2 via TOPICAL

## 2020-04-30 MED ORDER — ALBUTEROL SULFATE HFA 108 (90 BASE) MCG/ACT IN AERS: 2.0000 | INHALATION_SPRAY | RESPIRATORY_TRACT | Status: DC | PRN

## 2020-04-30 MED ORDER — PHENOL 1.4 % MT LIQD: 1.0000 | OROMUCOSAL | Status: DC | PRN

## 2020-04-30 MED ORDER — VENLAFAXINE HCL ER 150 MG PO CP24
150.0000 mg | ORAL_CAPSULE | Freq: Every day | ORAL | Status: DC
  Administered 2020-05-01: 10:00:00 150 mg via ORAL
  Filled 2020-04-30: qty 1

## 2020-04-30 MED ORDER — ONDANSETRON HCL 4 MG/2ML IJ SOLN
INTRAMUSCULAR | Status: DC | PRN
  Administered 2020-04-30: 4 mg via INTRAVENOUS

## 2020-04-30 MED ORDER — B COMPLEX-C PO TABS
1.0000 | ORAL_TABLET | Freq: Every day | ORAL | Status: DC
  Administered 2020-04-30 – 2020-05-01 (×2): 1 via ORAL
  Filled 2020-04-30 (×2): qty 1

## 2020-04-30 MED ORDER — PROPOFOL 500 MG/50ML IV EMUL
INTRAVENOUS | Status: DC | PRN
  Administered 2020-04-30: 75 ug/kg/min via INTRAVENOUS

## 2020-04-30 MED ORDER — DOCUSATE SODIUM 100 MG PO CAPS
100.0000 mg | ORAL_CAPSULE | Freq: Two times a day (BID) | ORAL | Status: DC
  Administered 2020-04-30 – 2020-05-01 (×2): 100 mg via ORAL
  Filled 2020-04-30 (×2): qty 1

## 2020-04-30 MED ORDER — PROPOFOL 1000 MG/100ML IV EMUL
INTRAVENOUS | Status: AC
  Filled 2020-04-30: qty 100

## 2020-04-30 MED ORDER — DIPHENHYDRAMINE HCL 12.5 MG/5ML PO ELIX: 12.5000 mg | ORAL_SOLUTION | ORAL | Status: DC | PRN

## 2020-04-30 MED ORDER — UMECLIDINIUM-VILANTEROL 62.5-25 MCG/INH IN AEPB
2.0000 | INHALATION_SPRAY | Freq: Every day | RESPIRATORY_TRACT | Status: DC | PRN
  Filled 2020-04-30: qty 14

## 2020-04-30 MED ORDER — METHOCARBAMOL 500 MG IVPB - SIMPLE MED
500.0000 mg | Freq: Four times a day (QID) | INTRAVENOUS | Status: DC | PRN
  Filled 2020-04-30: qty 50

## 2020-04-30 MED ORDER — PRAZOSIN HCL 1 MG PO CAPS
1.0000 mg | ORAL_CAPSULE | Freq: Every day | ORAL | Status: DC
  Administered 2020-04-30: 1 mg via ORAL
  Filled 2020-04-30: qty 1

## 2020-04-30 MED ORDER — FENTANYL CITRATE (PF) 100 MCG/2ML IJ SOLN
INTRAMUSCULAR | Status: DC | PRN
  Administered 2020-04-30 (×2): 50 ug via INTRAVENOUS

## 2020-04-30 MED ORDER — MEPERIDINE HCL 50 MG/ML IJ SOLN: 6.2500 mg | INTRAMUSCULAR | Status: DC | PRN

## 2020-04-30 MED ORDER — B COMPLEX PO TABS: 1.0000 | ORAL_TABLET | Freq: Every day | ORAL | Status: DC

## 2020-04-30 MED ORDER — ASCORBIC ACID 500 MG PO TABS
1000.0000 mg | ORAL_TABLET | Freq: Every day | ORAL | Status: DC
  Administered 2020-04-30 – 2020-05-01 (×2): 1000 mg via ORAL
  Filled 2020-04-30 (×2): qty 2

## 2020-04-30 MED ORDER — CLINDAMYCIN PHOSPHATE 600 MG/50ML IV SOLN
600.0000 mg | Freq: Four times a day (QID) | INTRAVENOUS | Status: AC
  Administered 2020-04-30 (×2): 600 mg via INTRAVENOUS
  Filled 2020-04-30 (×2): qty 50

## 2020-04-30 MED ORDER — OXYCODONE HCL 5 MG PO TABS: 10.0000 mg | ORAL_TABLET | ORAL | Status: DC | PRN

## 2020-04-30 MED ORDER — KETOROLAC TROMETHAMINE 15 MG/ML IJ SOLN
7.5000 mg | Freq: Four times a day (QID) | INTRAMUSCULAR | Status: DC
  Administered 2020-04-30 – 2020-05-01 (×3): 7.5 mg via INTRAVENOUS
  Filled 2020-04-30 (×3): qty 1

## 2020-04-30 SURGICAL SUPPLY — 40 items
BAG ZIPLOCK 12X15 (MISCELLANEOUS) IMPLANT
BENZOIN TINCTURE PRP APPL 2/3 (GAUZE/BANDAGES/DRESSINGS) IMPLANT
BLADE SAW SGTL 18X1.27X75 (BLADE) ×2 IMPLANT
COVER PERINEAL POST (MISCELLANEOUS) ×2 IMPLANT
COVER SURGICAL LIGHT HANDLE (MISCELLANEOUS) ×2 IMPLANT
COVER WAND RF STERILE (DRAPES) ×2 IMPLANT
CUP SECTOR GRIPTON 50MM (Cup) ×1 IMPLANT
DRAPE STERI IOBAN 125X83 (DRAPES) ×2 IMPLANT
DRAPE U-SHAPE 47X51 STRL (DRAPES) ×4 IMPLANT
DRESSING AQUACEL AG SP 3.5X10 (GAUZE/BANDAGES/DRESSINGS) IMPLANT
DRSG AQUACEL AG ADV 3.5X10 (GAUZE/BANDAGES/DRESSINGS) ×1 IMPLANT
DRSG AQUACEL AG SP 3.5X10 (GAUZE/BANDAGES/DRESSINGS) ×2
DURAPREP 26ML APPLICATOR (WOUND CARE) ×2 IMPLANT
ELECT REM PT RETURN 15FT ADLT (MISCELLANEOUS) ×2 IMPLANT
GAUZE XEROFORM 1X8 LF (GAUZE/BANDAGES/DRESSINGS) ×2 IMPLANT
GLOVE BIO SURGEON STRL SZ7.5 (GLOVE) ×2 IMPLANT
GLOVE BIOGEL PI IND STRL 8 (GLOVE) ×2 IMPLANT
GLOVE BIOGEL PI INDICATOR 8 (GLOVE) ×2
GLOVE ECLIPSE 8.0 STRL XLNG CF (GLOVE) ×2 IMPLANT
GOWN STRL REUS W/TWL XL LVL3 (GOWN DISPOSABLE) ×4 IMPLANT
HANDPIECE INTERPULSE COAX TIP (DISPOSABLE) ×2
HEAD FEM STD 32X+1 STRL (Hips) ×1 IMPLANT
HOLDER FOLEY CATH W/STRAP (MISCELLANEOUS) ×2 IMPLANT
KIT TURNOVER KIT A (KITS) IMPLANT
LINER ACETABULAR 32X50 (Liner) ×1 IMPLANT
PACK ANTERIOR HIP CUSTOM (KITS) ×2 IMPLANT
PENCIL SMOKE EVACUATOR (MISCELLANEOUS) IMPLANT
SET HNDPC FAN SPRY TIP SCT (DISPOSABLE) ×1 IMPLANT
STAPLER VISISTAT 35W (STAPLE) ×1 IMPLANT
STEM CORAIL KA12 (Stem) ×1 IMPLANT
STRIP CLOSURE SKIN 1/2X4 (GAUZE/BANDAGES/DRESSINGS) IMPLANT
SUT ETHIBOND NAB CT1 #1 30IN (SUTURE) ×2 IMPLANT
SUT ETHILON 2 0 PS N (SUTURE) IMPLANT
SUT MNCRL AB 4-0 PS2 18 (SUTURE) IMPLANT
SUT VIC AB 0 CT1 36 (SUTURE) ×2 IMPLANT
SUT VIC AB 1 CT1 36 (SUTURE) ×2 IMPLANT
SUT VIC AB 2-0 CT1 27 (SUTURE) ×4
SUT VIC AB 2-0 CT1 TAPERPNT 27 (SUTURE) ×2 IMPLANT
TRAY FOLEY MTR SLVR 16FR STAT (SET/KITS/TRAYS/PACK) IMPLANT
YANKAUER SUCT BULB TIP 10FT TU (MISCELLANEOUS) ×2 IMPLANT

## 2020-04-30 NOTE — Brief Op Note (Signed)
04/30/2020  12:22 PM  PATIENT:  Brenda Carney  69 y.o. female  PRE-OPERATIVE DIAGNOSIS:  Avascular Necrosis Left Hip  POST-OPERATIVE DIAGNOSIS:  Avascular Necrosis Left Hip  PROCEDURE:  Procedure(s): LEFT TOTAL HIP ARTHROPLASTY ANTERIOR APPROACH (Left)  SURGEON:  Surgeon(s) and Role:    Mcarthur Rossetti, MD - Primary  PHYSICIAN ASSISTANT: Benita Stabile, PA-C  ANESTHESIA:   spinal  EBL:  50 mL   COUNTS:  YES  DICTATION: .Other Dictation: Dictation Number [id number]  PLAN OF CARE: Admit for overnight observation  PATIENT DISPOSITION:  PACU - hemodynamically stable.   Delay start of Pharmacological VTE agent (>24hrs) due to surgical blood loss or risk of bleeding: no

## 2020-04-30 NOTE — Care Plan (Signed)
Ortho Bundle Case Management Note  Patient Details  Name: Brenda Carney MRN: [id number] Date of Birth: [birth year]  Ortho bundle pre-op call to patient to discuss her upcoming Left-THA with Dr. Ninfa Linden on 04/30/20. Patient has recently had her Right THA done with Dr. Ninfa Linden in January of this year. Reviewed that CM will be following along again for any needs related to her upcoming surgery as she is an Ortho bundle patient through [facility]. She is agreeable. Anticipates no HHPT will be needed after a short hospital stay as she is familiar with all home exercises. She has a cane, FWW and states she does not have a 3in1 or BSC. Order placed with [facility] for [facility]. Patient has hired a CG for help after surgery.  Will continue to follow for any needs.  DME Arranged:  3-N-1 DME Agency:  [facility]  HH Arranged:    [place] Agency:  (Patient verbalized she does not wish for any HHPT due to previously having hip replacement and aware of home exercises)  Additional Comments: Please contact me with any questions of if this plan should need to change.  Jamse Arn, RN, BSN, [employer]  [phone] 04/30/2020, 11:48 AM

## 2020-04-30 NOTE — Anesthesia Postprocedure Evaluation (Signed)
Anesthesia Post Note  Patient: ANALIZ TVEDT  Procedure(s) Performed: LEFT TOTAL HIP ARTHROPLASTY ANTERIOR APPROACH (Left Hip)     Patient location during evaluation: PACU Anesthesia Type: MAC Level of consciousness: oriented and awake and alert Pain management: pain level controlled Vital Signs Assessment: post-procedure vital signs reviewed and stable Respiratory status: spontaneous breathing, respiratory function stable and patient connected to nasal cannula oxygen Cardiovascular status: blood pressure returned to baseline and stable Postop Assessment: no headache, no backache and no apparent nausea or vomiting Anesthetic complications: no    Last Vitals:  Vitals:   04/30/20 1443 04/30/20 1543  BP: (!) 149/95 (!) 142/88  Pulse: 86 96  Resp: 17 18  Temp: 36.7 C 36.7 C  SpO2: 100% 96%    Last Pain:  Vitals:   04/30/20 1315  TempSrc:   PainSc: Asleep                 Jevaeh Shams

## 2020-04-30 NOTE — Interval H&P Note (Signed)
History and Physical Interval Note:  The patient understands she is here today for a left total hip arthroplasty to treat hip pain from avascular necrosis.  Having had surgery done recently on her right hip she is fully aware what the surgery involves as well as the risk and benefits.  There has been no acute change in her medical status.  See recent H&P.  The left hip is been marked and informed consent is obtained.  04/30/2020 8:39 AM  Brenda Carney  has presented today for surgery, with the diagnosis of Avascular Necrosis Left Hip.  The various methods of treatment have been discussed with the patient and family. After consideration of risks, benefits and other options for treatment, the patient has consented to  Procedure(s): LEFT TOTAL HIP ARTHROPLASTY ANTERIOR APPROACH (Left) as a surgical intervention.  The patient's history has been reviewed, patient examined, no change in status, stable for surgery.  I have reviewed the patient's chart and labs.  Questions were answered to the patient's satisfaction.     Mcarthur Rossetti

## 2020-04-30 NOTE — Plan of Care (Signed)
  Problem: Education: Goal: Knowledge of the prescribed therapeutic regimen will improve Outcome: Progressing Goal: Understanding of discharge needs will improve Outcome: Progressing Goal: Individualized Educational Video(s) Outcome: Progressing   Problem: Activity: Goal: Ability to avoid complications of mobility impairment will improve Outcome: Progressing Goal: Ability to tolerate increased activity will improve Outcome: Progressing   Problem: Clinical Measurements: Goal: Postoperative complications will be avoided or minimized Outcome: Progressing   Problem: Pain Management: Goal: Pain level will decrease with appropriate interventions Outcome: Progressing   Problem: Skin Integrity: Goal: Will show signs of wound healing Outcome: Progressing   Problem: Education: Goal: Knowledge of the prescribed therapeutic regimen will improve Outcome: Progressing Goal: Understanding of discharge needs will improve Outcome: Progressing Goal: Individualized Educational Video(s) Outcome: Progressing   Problem: Activity: Goal: Ability to avoid complications of mobility impairment will improve Outcome: Progressing Goal: Ability to tolerate increased activity will improve Outcome: Progressing   Problem: Clinical Measurements: Goal: Postoperative complications will be avoided or minimized Outcome: Progressing   Problem: Pain Management: Goal: Pain level will decrease with appropriate interventions Outcome: Progressing   Problem: Skin Integrity: Goal: Will show signs of wound healing Outcome: Progressing

## 2020-04-30 NOTE — Evaluation (Signed)
Physical Therapy Evaluation Patient Details Name: Brenda Carney MRN: [id number] DOB: 05-06-1951 Today's Date: 04/30/2020   History of Present Illness  Pt is 69 yo female s/p L anterior THA on 04/30/20.  Pt with PMH including AVN hip, R THA, GERD, depression, COPD, low back pain, cerebral aneurysm, arthritis, anemia, CAD, lower limb ischemia, lumbar spinal fusion, PAD, TIA (see H and P for full PMH)  Clinical Impression  Pt is s/p L anteriorTHA resulting in the deficits listed below (see PT Problem List). Pt making excellent progress for DOS.  Required supv to min guard for transfers with min cues for safety and transfer techniques.  Pt with excellent rehab potential. Pt will benefit from skilled PT to increase their independence and safety with mobility to allow discharge to the venue listed below.      Follow Up Recommendations Follow surgeon's recommendation for DC plan and follow-up therapies    Equipment Recommendations  None recommended by PT    Recommendations for Other Services       Precautions / Restrictions Precautions Precautions: Fall Restrictions Weight Bearing Restrictions: Yes      Mobility  Bed Mobility Overal bed mobility: Needs Assistance Bed Mobility: Supine to Sit;Sit to Supine     Supine to sit: Min guard        Transfers Overall transfer level: Needs assistance Equipment used: Rolling walker (2 wheeled) Transfers: Sit to/from Stand Sit to Stand: Min guard         General transfer comment: cues for safe hand placement  Ambulation/Gait Ambulation/Gait assistance: Min guard Gait Distance (Feet): 200 Feet Assistive device: Rolling walker (2 wheeled) Gait Pattern/deviations: Step-through pattern;Antalgic;Decreased stance time - left Gait velocity: decreased   General Gait Details: mild decrease in stance time on L  Stairs            Wheelchair Mobility    Modified Rankin (Stroke Patients Only)       Balance Overall balance  assessment: Needs assistance   Sitting balance-Leahy Scale: Normal     Standing balance support: Bilateral upper extremity supported Standing balance-Leahy Scale: Fair                               Pertinent Vitals/Pain Pain Assessment: 0-10 Pain Score: 5  Pain Location: L hip Pain Descriptors / Indicators: Discomfort;Sore Pain Intervention(s): Limited activity within patient's tolerance;Monitored during session;Premedicated before session;Repositioned;Relaxation;Ice applied    Home Living Family/patient expects to be discharged to:: Private residence Living Arrangements: Alone Available Help at Discharge: Friend(s)(has friends to assist for the next few days) Type of Home: House Home Access: Stairs to enter Entrance Stairs-Rails: None Entrance Stairs-Number of Steps: 2 Home Layout: Able to live on main level with bedroom/bathroom;Multi-level Home Equipment: Bedside commode;Shower seat;Walker - 2 wheels;Cane - single point      Prior Function Level of Independence: Independent;Needs assistance   Gait / Transfers Assistance Needed: Could ambulate in community  ADL's / Homemaking Assistance Needed: reports independent with ADLs, IADLs, and driving        Hand Dominance        Extremity/Trunk Assessment   Upper Extremity Assessment Upper Extremity Assessment: Overall WFL for tasks assessed    Lower Extremity Assessment Lower Extremity Assessment: LLE deficits/detail;RLE deficits/detail RLE Deficits / Details: WFL LLE Deficits / Details: ROM WFL; MMT ankle 5/5, hip and knee 3/5 not further tested    Cervical / Trunk Assessment Cervical / Trunk Assessment: Normal  Communication   Communication: No difficulties  Cognition Arousal/Alertness: Awake/alert Behavior During Therapy: WFL for tasks assessed/performed Overall Cognitive Status: Within Functional Limits for tasks assessed                                        General  Comments General comments (skin integrity, edema, etc.): VSS    Exercises Total Joint Exercises Ankle Circles/Pumps: AROM;Both;10 reps;Seated Quad Sets: AROM;Both;10 reps;Seated Heel Slides: AAROM;Left;5 reps;Supine Hip ABduction/ADduction: AAROM;Left;5 reps;Supine   Assessment/Plan    PT Assessment Patient needs continued PT services  PT Problem List Decreased strength;Decreased mobility;Decreased safety awareness;Decreased range of motion;Decreased knowledge of precautions;Decreased activity tolerance;Decreased balance;Decreased knowledge of use of DME;Pain       PT Treatment Interventions DME instruction;Therapeutic activities;Gait training;Therapeutic exercise;Patient/family education;Stair training;Balance training;Functional mobility training    PT Goals (Current goals can be found in the Care Plan section)  Acute Rehab PT Goals Patient Stated Goal: return home PT Goal Formulation: With patient Time For Goal Achievement: 05/14/20 Potential to Achieve Goals: Good    Frequency 7X/week   Barriers to discharge        Co-evaluation               AM-PAC PT "6 Clicks" Mobility  Outcome Measure Help needed turning from your back to your side while in a flat bed without using bedrails?: None Help needed moving from lying on your back to sitting on the side of a flat bed without using bedrails?: None Help needed moving to and from a bed to a chair (including a wheelchair)?: None Help needed standing up from a chair using your arms (e.g., wheelchair or bedside chair)?: None Help needed to walk in hospital room?: None Help needed climbing 3-5 steps with a railing? : A Little 6 Click Score: 23    End of Session Equipment Utilized During Treatment: Gait belt Activity Tolerance: Patient tolerated treatment well Patient left: with chair alarm set;in chair;with call bell/phone within reach Nurse Communication: Mobility status PT Visit Diagnosis: Other abnormalities of gait  and mobility (R26.89);Muscle weakness (generalized) (M62.81)    Time: [id number] PT Time Calculation (min) (ACUTE ONLY): 30 min   Charges:   PT Evaluation $PT Eval Low Complexity: 1 Low PT Treatments $Therapeutic Exercise: 8-22 mins        Maggie Font, PT Acute Rehab Services Pager [phone] [facility] [phone] [facility] [phone]   Karlton Lemon 04/30/2020, 6:02 PM

## 2020-04-30 NOTE — Anesthesia Procedure Notes (Signed)
Spinal  Patient location during procedure: OR Start time: 04/30/2020 11:15 AM End time: 04/30/2020 11:24 AM Staffing Anesthesiologist: Janeece Riggers, MD Preanesthetic Checklist Completed: patient identified, IV checked, site marked, risks and benefits discussed, surgical consent, monitors and equipment checked, pre-op evaluation and timeout performed Spinal Block Patient position: sitting Prep: DuraPrep Patient monitoring: heart rate, cardiac monitor, continuous pulse ox and blood pressure Approach: midline Location: L2-3 Injection technique: single-shot Needle Needle type: Sprotte  Needle gauge: 22 G Needle length: 9 cm Assessment Sensory level: T4 Events: paresthesia Additional Notes Multiple attempts, Changed to one level up  +CSF /  DOSE /+CSF  Transient paraesthesia resolved

## 2020-04-30 NOTE — Transfer of Care (Signed)
Immediate Anesthesia Transfer of Care Note  Patient: Brenda Carney  Procedure(s) Performed: LEFT TOTAL HIP ARTHROPLASTY ANTERIOR APPROACH (Left Hip)  Patient Location: PACU  Anesthesia Type:Spinal  Level of Consciousness: awake, alert  and oriented  Airway & Oxygen Therapy: Patient Spontanous Breathing and Patient connected to face mask oxygen  Post-op Assessment: Report given to RN and Post -op Vital signs reviewed and stable  Post vital signs: Reviewed and stable  Last Vitals:  Vitals Value Taken Time  BP    Temp    Pulse    Resp    SpO2      Last Pain:  Vitals:   04/30/20 0920  TempSrc:   PainSc: 3       Patients Stated Pain Goal: 3 ([id number])  Complications: No apparent anesthesia complications

## 2020-04-30 NOTE — Op Note (Signed)
NAMEMCKENDRA, KOFOED MEDICAL RECORD [id number] ACCOUNT [id number] DATE OF BIRTH:May 27, 1951 FACILITY: [facility] LOCATION: [facility] PHYSICIAN:Carroll Lingelbach Kerry Fort, MD  OPERATIVE REPORT  DATE OF PROCEDURE:  04/30/2020  PREOPERATIVE DIAGNOSIS:  Avascular necrosis, left hip.  POSTOPERATIVE DIAGNOSIS:  Avascular necrosis, left hip.  PROCEDURE:  Left total hip arthroplasty through direct anterior approach.  IMPLANTS:  DePuy Sector Gription acetabular component size 50, size 32+0 neutral polyethylene liner, size 12 Corail femoral component with standard offset, size 32+1 metal hip ball.  SURGEON:  Lind Guest. Ninfa Linden, MD  ASSISTANT:  Erskine Emery, PA-C  ANESTHESIA:  Spinal.  ANTIBIOTICS:  900 mg IV clindamycin.  ESTIMATED BLOOD LOSS:  50 mL.  COMPLICATIONS:  None.  INDICATIONS:  The patient is a 69 year old female well known to me.  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  In January of this year I did replace her right hip successfully through direct anterior approach.  She has significant hip pain in the  left hip with obvious radiographic evidence on plain films and MRI of avascular necrosis of her left hip.  At this point, with her daily hip pain she does wish to proceed with a total hip arthroplasty on the left side given the success of her right hip  surgery as well as the worsening of her avascular necrosis of her left hip.  Having had the surgery before, she is fully aware of the risk of acute blood loss anemia, nerve or vessel injury, fracture, infection, dislocation, DVT and implant failure.  She  understands our goals are to decrease pain, improve mobility, and overall improve quality of life.  DESCRIPTION OF PROCEDURE:  After informed consent was obtained and appropriate left hip was marked she was brought to the operating room, sat up on a stretcher where spinal anesthesia was then obtained.  She was then laid in the supine position on a  stretcher.  Foley  catheter was placed.  I was able to get a good assessment of her leg length and then placed traction boots on both her feet.  Next, she was placed supine on the Hana fracture table, the perineal post in place and both legs in line  skeletal traction device and no traction applied.  Her left operative hip was prepped and draped with DuraPrep and sterile drapes.  A timeout was called and she was identified as correct patient, correct left hip.  I then made an incision just inferior  and posterior to the anterior iliac spine and carried this obliquely down the leg.  We dissected down tensor fascia lata muscle.  Tensor fascia was then divided longitudinally to proceed with direct anterior approach to the hip.  We identified and  cauterized circumflex vessels.  I then identified the hip capsule, opened the hip capsule in an L-type format, finding moderate joint effusion consistent with AVN.  We placed curved retractors around the medial and lateral femoral neck and made our  femoral neck cut with an oscillating saw and completed this with an osteotome.  We placed a corkscrew guide in the femoral head and removed the femoral head in its entirety and found the cartilage to be fibrinated and no osteoarthritis, but definitely  avascular necrosis.  We then cleaned the remnants of the acetabular labrum and other debris around the rim of the acetabulum.  I placed a bent Hohmann over the medial acetabular rim and then began reaming under direct visualization from a size 44 reamer  in stepwise increments to a size 49.  With  all reamers under direct visualization, the last reamer was placed under direct fluoroscopy so I could obtain my depth of reaming by inclination and anteversion.  I then placed the real DePuy Sector Gription  acetabular component size 50 and a 32+0 neutral polyethylene liner.  Attention was then turned to the femur.  With the leg externally rotated to 120 degrees, extended and adducted, we replaced the  Mueller retractor medially and Hohmann retractor above  the greater trochanter, released lateral joint capsule and used a box-cutting osteotome to enter the femoral canal and a rongeur to lateralize.  Then began broaching using the Corail broaching system from a size 8 going up to a size 12.  With the size 12  in place, we trialed a standard offset femoral neck and a 32+1 trial hip ball.  We brought the leg back over and up with traction and internal rotation, reducing the pelvis.  We were pleased with leg length, offset, range of motion and stability  assessed radiographically and clinically.  We then dislocated the hip and removed the trial components.  I placed the real Corail femoral component with standard offset size 12 and the real 32+1 metal hip ball and again reduced this in the acetabulum.   We were pleased with stability.  We then irrigated the soft tissue with normal saline solution using pulsatile lavage.  We assessed our pelvis again and femur with C-arm.  We then closed the joint capsule with interrupted #1 Ethibond suture, followed by  closing the tensor fascia with #1 Vicryl.  0 Vicryl was used to close deep tissue, 2-0 Vicryl was used to close the subcutaneous tissue.  The skin was reapproximated with staples.  Xeroform and Aquacel dressing was applied.  She was taken off the Hana  table and taken to recovery room in stable condition.  All final counts were correct.  There were no complications noted.  Of note, Benita Stabile, PA-C did assist during the entire case and his assistance was crucial for facilitating all aspects of this  case.  CN/NUANCE  D:04/30/2020 T:04/30/2020 JOB:[id number]

## 2020-04-30 NOTE — Anesthesia Procedure Notes (Signed)
Procedure Name: MAC Date/Time: 04/30/2020 11:10 AM Performed by: Eben Burow, CRNA Pre-anesthesia Checklist: Patient identified, Emergency Drugs available, Suction available, Patient being monitored and Timeout performed Oxygen Delivery Method: Simple face mask Dental Injury: Teeth and Oropharynx as per pre-operative assessment

## 2020-04-30 NOTE — Anesthesia Preprocedure Evaluation (Addendum)
Anesthesia Evaluation  Patient identified by MRN, date of birth, ID band Patient awake    Reviewed: Allergy & Precautions, NPO status , Patient's Chart, lab work & pertinent test results  Airway Mallampati: II  TM Distance: >3 FB Neck ROM: Full    Dental  (+) Teeth Intact, Dental Advisory Given, Implants   Pulmonary asthma , sleep apnea , COPD,  COPD inhaler, Current Smoker and Patient abstained from smoking., former smoker,    Pulmonary exam normal breath sounds clear to auscultation       Cardiovascular hypertension, Pt. on medications + CAD and + Peripheral Vascular Disease  Normal cardiovascular exam Rhythm:Regular Rate:Normal  Nuclear stress EF: 66%.  The left ventricular ejection fraction is hyperdynamic (>65%).  There was no ST segment deviation noted during stress.  The study is normal.  This is a low risk study.   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  Reproductive/Obstetrics                            Anesthesia Physical  Anesthesia Plan  ASA: III  Anesthesia Plan: Spinal and MAC   Post-op Pain Management:    Induction: Intravenous  PONV Risk Score and Plan: 1 and Propofol infusion, Treatment may vary due to age or medical condition and Ondansetron  Airway Management Planned: Natural Airway and Nasal Cannula  Additional Equipment:   Intra-op Plan:   Post-operative Plan:   Informed  Consent: I have reviewed the patients History and Physical, chart, labs and discussed the procedure including the risks, benefits and alternatives for the proposed anesthesia with the patient or authorized representative who has indicated his/her understanding and acceptance.     Dental advisory given  Plan Discussed with: CRNA and Anesthesiologist  Anesthesia Plan Comments:         Anesthesia Quick Evaluation

## 2020-05-01 DIAGNOSIS — M1990 Unspecified osteoarthritis, unspecified site: Secondary | ICD-10-CM | POA: Diagnosis not present

## 2020-05-01 DIAGNOSIS — J449 Chronic obstructive pulmonary disease, unspecified: Secondary | ICD-10-CM | POA: Diagnosis not present

## 2020-05-01 DIAGNOSIS — M879 Osteonecrosis, unspecified: Secondary | ICD-10-CM | POA: Diagnosis not present

## 2020-05-01 DIAGNOSIS — M545 Low back pain: Secondary | ICD-10-CM | POA: Diagnosis not present

## 2020-05-01 DIAGNOSIS — G8929 Other chronic pain: Secondary | ICD-10-CM | POA: Diagnosis not present

## 2020-05-01 DIAGNOSIS — I2510 Atherosclerotic heart disease of native coronary artery without angina pectoris: Secondary | ICD-10-CM | POA: Diagnosis not present

## 2020-05-01 LAB — BASIC METABOLIC PANEL
Anion gap: 8 (ref 5–15)
BUN: 13 mg/dL (ref 8–23)
CO2: 25 mmol/L (ref 22–32)
Calcium: 8.6 mg/dL — ABNORMAL LOW (ref 8.9–10.3)
Chloride: 103 mmol/L (ref 98–111)
Creatinine, Ser: 0.85 mg/dL (ref 0.44–1.00)
GFR calc Af Amer: >60 mL/min (ref >60)
GFR calc non Af Amer: >60 mL/min (ref >60)
Glucose, Bld: 128 mg/dL — ABNORMAL HIGH (ref 70–99)
Potassium: 3.1 mmol/L — ABNORMAL LOW (ref 3.5–5.1)
Sodium: 136 mmol/L (ref 135–145)

## 2020-05-01 LAB — CBC
HCT: 37.5 % (ref 36.0–46.0)
Hemoglobin: 12.8 g/dL (ref 12.0–15.0)
MCH: 33 pg (ref 26.0–34.0)
MCHC: 34.1 g/dL (ref 30.0–36.0)
MCV: 96.6 fL (ref 80.0–100.0)
Platelets: 309 10*3/uL (ref 150–400)
RBC: 3.88 MIL/uL (ref 3.87–5.11)
RDW: 13.5 % (ref 11.5–15.5)
WBC: 12.1 10*3/uL — ABNORMAL HIGH (ref 4.0–10.5)
nRBC: 0 % (ref 0.0–0.2)

## 2020-05-01 MED ORDER — ASPIRIN 81 MG PO CHEW: 81.0000 mg | CHEWABLE_TABLET | Freq: Two times a day (BID) | ORAL | 0 refills | Status: DC

## 2020-05-01 MED ORDER — OXYCODONE HCL 5 MG PO TABS: 5.0000 mg | ORAL_TABLET | ORAL | 0 refills | Status: DC | PRN

## 2020-05-01 MED ORDER — METHOCARBAMOL 500 MG PO TABS: 500.0000 mg | ORAL_TABLET | Freq: Four times a day (QID) | ORAL | 1 refills | Status: DC | PRN

## 2020-05-01 NOTE — Discharge Instructions (Signed)

## 2020-05-01 NOTE — Progress Notes (Signed)
Physical Therapy Treatment Patient Details Name: Brenda Carney MRN: [id number] DOB: Apr 18, 1951 Today's Date: 05/01/2020    History of Present Illness Pt is 69 yo female s/p L anterior THA on 04/30/20.  Pt with PMH including AVN hip, R THA, GERD, depression, COPD, low back pain, cerebral aneurysm, arthritis, anemia, CAD, lower limb ischemia, lumbar spinal fusion, PAD, TIA (see H and P for full PMH)    PT Comments    Pt mobilizing well with PT, ambulating great hallway distance with min cuing for form and safety with use of RW. Pt proficiently navigated steps with use of RW, PT educating pt on the importance of her friend assisting her with stabilizing RW, pt understands. Pt performed THA exercises well, and will have HHPT to continue to progress mobility. No further acute PT needs at this time, pt appropriate to d/c from mobility standpoint. All education completed.    Follow Up Recommendations  Follow surgeon's recommendation for DC plan and follow-up therapies     Equipment Recommendations  None recommended by PT    Recommendations for Other Services       Precautions / Restrictions Precautions Precautions: Fall Restrictions Weight Bearing Restrictions: Yes LLE Weight Bearing: Weight bearing as tolerated    Mobility  Bed Mobility Overal bed mobility: Needs Assistance Bed Mobility: Supine to Sit     Supine to sit: Supervision     General bed mobility comments: supervision for safety, increased time and effort to initially progress LLE to EOB.  Transfers Overall transfer level: Needs assistance Equipment used: Rolling walker (2 wheeled) Transfers: Sit to/from Stand Sit to Stand: Supervision         General transfer comment: for safety, increased time to rise secondary to L hip discomfort.  Ambulation/Gait Ambulation/Gait assistance: Supervision Gait Distance (Feet): 200 Feet(+100 to walk to rehab gym) Assistive device: Rolling walker (2 wheeled) Gait  Pattern/deviations: Step-through pattern;Antalgic;Decreased stance time - left;Trunk flexed Gait velocity: decr   General Gait Details: supervision for safety, verbal cuing for upright posture, placement in RW, step-over-step gait.   Stairs Stairs: Yes Stairs assistance: Min guard Stair Management: No rails;Step to pattern;Forwards;With walker Number of Stairs: 4 General stair comments: min guard for safety, bracing front of RW (instructed pt that caregiver should do the same). Verbal cuing for bracing RW against step, sequencing (up with good leg, down with bad leg).   Wheelchair Mobility    Modified Rankin (Stroke Patients Only)       Balance Overall balance assessment: Needs assistance   Sitting balance-Leahy Scale: Good     Standing balance support: Bilateral upper extremity supported Standing balance-Leahy Scale: Poor Standing balance comment: reliant on externa support                            Cognition Arousal/Alertness: Awake/alert Behavior During Therapy: WFL for tasks assessed/performed Overall Cognitive Status: Within Functional Limits for tasks assessed                                        Exercises Total Joint Exercises Ankle Circles/Pumps: AROM;Both;10 reps;Seated Quad Sets: AROM;10 reps;Seated;Left Heel Slides: AAROM;Left;10 reps;Seated Hip ABduction/ADduction: AAROM;Left;10 reps;Seated Long Arc Quad: AROM;Left;10 reps;Seated    General Comments        Pertinent Vitals/Pain Pain Assessment: Faces Pain Score: 3  Pain Location: L hip Pain Descriptors / Indicators: Discomfort;Sore Pain  Intervention(s): Limited activity within patient's tolerance;Monitored during session;Repositioned    Home Living                      Prior Function            PT Goals (current goals can now be found in the care plan section) Acute Rehab PT Goals Patient Stated Goal: return home PT Goal Formulation: With  patient Time For Goal Achievement: 05/14/20 Potential to Achieve Goals: Good Progress towards PT goals: Progressing toward goals    Frequency    7X/week      PT Plan Current plan remains appropriate    Co-evaluation              AM-PAC PT "6 Clicks" Mobility   Outcome Measure  Help needed turning from your back to your side while in a flat bed without using bedrails?: None Help needed moving from lying on your back to sitting on the side of a flat bed without using bedrails?: None Help needed moving to and from a bed to a chair (including a wheelchair)?: None Help needed standing up from a chair using your arms (e.g., wheelchair or bedside chair)?: None Help needed to walk in hospital room?: None Help needed climbing 3-5 steps with a railing? : A Little 6 Click Score: 23    End of Session Equipment Utilized During Treatment: Gait belt Activity Tolerance: Patient tolerated treatment well Patient left: with chair alarm set;in chair;with call bell/phone within reach Nurse Communication: Mobility status PT Visit Diagnosis: Other abnormalities of gait and mobility (R26.89);Muscle weakness (generalized) (M62.81)     Time: [id number] PT Time Calculation (min) (ACUTE ONLY): 30 min  Charges:  $Gait Training: 8-22 mins $Therapeutic Exercise: 8-22 mins                     Amayah Staheli E, PT Acute Rehabilitation Services Pager [phone]  Office [phone]   Maleny Candy D Elonda Husky 05/01/2020, 9:49 AM

## 2020-05-01 NOTE — TOC Progression Note (Signed)
Transition of Care [facility]) - Progression Note    Patient Details  Name: Brenda Carney MRN: [id number] Date of Birth: 1951/10/24  [facility] CM/SW Contact  Joaquin Courts, RN Phone Number: 05/01/2020, 11:45 AM  Clinical Narrative:   Patient set up with [facility] for [place], reports has RW and declines 3in1.     Expected Discharge Plan: [place] Barriers to Discharge: No Barriers Identified  Expected Discharge Plan and Services Expected Discharge Plan: [place]   Discharge Planning Services: CM Consult Post Acute Care Choice: [place] arrangements for the past 2 months: Single Family Home Expected Discharge Date: 05/01/20               DME Arranged: 3-N-1 DME Agency: [employer]       HH Arranged: PT HH Agency: [facility] (formerly [employer])     Representative spoke with at [place]: pre arranged in MD office   Social Determinants of Health (SDOH) Interventions    Readmission Risk Interventions No flowsheet data found.

## 2020-05-01 NOTE — Progress Notes (Signed)
Subjective: 1 Day Post-Op Procedure(s) (LRB): LEFT TOTAL HIP ARTHROPLASTY ANTERIOR APPROACH (Left) Patient reports pain as moderate.    Objective: Vital signs in last 24 hours: Temp:  [97.4 F (36.3 C)-98.7 F (37.1 C)] 97.8 F (36.6 C) (05/08 0924) Pulse Rate:  [67-104] 79 (05/08 0924) Resp:  [12-23] 18 (05/08 0924) BP: (92-149)/(63-95) 124/83 (05/08 0924) SpO2:  [75 %-100 %] 98 % (05/08 0924)  Intake/Output from previous day: 05/07 0701 - 05/08 0700 In: 2812.5 [P.O.:720; I.V.:1892.5; IV Piggyback:200] Out: 2750 [Urine:2700; Blood:50] Intake/Output this shift: No intake/output data recorded.  Recent Labs    05/01/20 0330  HGB 12.8   Recent Labs    05/01/20 0330  WBC 12.1*  RBC 3.88  HCT 37.5  PLT 309   Recent Labs    05/01/20 0330  NA 136  K 3.1*  CL 103  CO2 25  BUN 13  CREATININE 0.85  GLUCOSE 128*  CALCIUM 8.6*   No results for input(s): LABPT, INR in the last 72 hours.  Sensation intact distally Intact pulses distally Dorsiflexion/Plantar flexion intact Incision: dressing C/D/I   Assessment/Plan: 1 Day Post-Op Procedure(s) (LRB): LEFT TOTAL HIP ARTHROPLASTY ANTERIOR APPROACH (Left) Up with therapy Discharge home with home health    Patient's anticipated LOS is less than 2 midnights, meeting these requirements: - Younger than 23 - Lives within 1 hour of care - Has a competent adult at home to recover with post-op recover - NO history of  - Chronic pain requiring opiods  - Diabetes  - Coronary Artery Disease  - Heart failure  - Heart attack  - Stroke  - DVT/VTE  - Cardiac arrhythmia  - Respiratory Failure/COPD  - Renal failure  - Anemia  - Advanced Liver disease       Mcarthur Rossetti 05/01/2020, 9:51 AM

## 2020-05-01 NOTE — Discharge Summary (Signed)
Patient ID: Brenda Carney MRN: [id number] DOB/AGE: 69-Jul-1952 69 y.o.  Admit date: 04/30/2020 Discharge date: 05/01/2020  Admission Diagnoses:  Principal Problem:   Avascular necrosis of bone of left hip ([place]) Active Problems:   Status post total replacement of left hip   Discharge Diagnoses:  Same  Past Medical History:  Diagnosis Date  . Arthritis    "thumbs; hips" (05/14/2017)  . 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 Cerebrovascular disease    Carotid dopplers which showed no significant increase in velocities, extremely minimal on the left, antegrade vertebral bilaterally.  . Cervical spondylosis   . Chronic lower back pain   . COPD (chronic obstructive pulmonary disease) ([place])    "little bit" (05/14/2017)  . Coronary artery disease, non-occlusive    Coronary calcium scoring: June 2018: Score 1106. 98 percentile for age -[place]  . Depression   . Exercise-induced asthma with acute exacerbation    "only in very hot weather" (05/14/2017)  . GERD (gastroesophageal reflux disease)   . History of IBS    resovlved  . 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 Hyperlipidemia with target LDL less than 70    Mostly statin intolerant,. Currently on Livalo 4 mg  . Hypertension, benign   . Lumbosacral spondylosis   . PAD (peripheral artery disease) (HCC)    Status post right above-the-knee-below the knee popliteal artery bypass; postop right ABI 0.96.  [facility] Kitchen Pneumonia 2020  . Sleep apnea    does not wear CPAP; "think it was medication related" (05/14/2017)  . Stroke [facility]) 2010   TIA  . Thoracic aortic ectasia (HCC)    ~40 mm on Coronary Calcium Score CT - recommend f/u CTA or MRA  . TIA (transient ischemic attack) 09/2011   "several"    Surgeries: Procedure(s): LEFT TOTAL HIP ARTHROPLASTY ANTERIOR APPROACH on 04/30/2020   Consultants:   Discharged Condition: Improved  Hospital  Course: LAROSE CHAPNICK is an 69 y.o. female who was admitted 04/30/2020 for operative treatment ofAvascular necrosis of bone of left hip ([place]). Patient has severe unremitting pain that affects sleep, daily activities, and work/hobbies. After pre-op clearance the patient was taken to the operating room on 04/30/2020 and underwent  Procedure(s): LEFT TOTAL HIP ARTHROPLASTY ANTERIOR APPROACH.    Patient was given perioperative antibiotics:  Anti-infectives (From admission, onward)   Start     Dose/Rate Route Frequency Ordered Stop   04/30/20 1800  clindamycin (CLEOCIN) IVPB 600 mg     600 mg 100 mL/hr over 30 Minutes Intravenous Every 6 hours 04/30/20 1425 05/01/20 0007   04/30/20 0900  clindamycin (CLEOCIN) IVPB 900 mg     900 mg 100 mL/hr over 30 Minutes Intravenous On call to O.R. 04/30/20 0849 04/30/20 1124       Patient was given sequential compression devices, early ambulation, and chemoprophylaxis to prevent DVT.  Patient benefited maximally from hospital stay and there were no complications.    Recent vital signs:  Patient Vitals for the past 24 hrs:  BP Temp Temp src Pulse Resp SpO2  05/01/20 0924 124/83 97.8 F (36.6 C) Oral 79 18 98 %  05/01/20 0509 126/76 98.1 F (36.7 C) Oral 82 16 96 %  05/01/20 0041 102/76 98.5 F (36.9 C) Oral 88 16 93 %  04/30/20 2131 106/76 98.2 F (36.8 C) Oral 90 18 93 %  04/30/20 1800 128/88 98.7 F (  37.1 C) Oral (!) 101 18 94 %  04/30/20 1652 (!) 131/92 98.7 F (37.1 C) Oral (!) 104 18 93 %  04/30/20 1543 (!) 142/88 98 F (36.7 C) -- 96 18 96 %  04/30/20 1443 (!) 149/95 98 F (36.7 C) -- 86 17 100 %  04/30/20 1310 111/70 -- -- 67 18 95 %  04/30/20 1305 105/73 -- -- 69 (!) 23 (!) 75 %  04/30/20 1250 111/72 -- -- -- 12 --  04/30/20 1245 92/63 (!) 97.4 F (36.3 C) -- 73 18 100 %     Recent laboratory studies:  Recent Labs    05/01/20 0330  WBC 12.1*  HGB 12.8  HCT 37.5  PLT 309  NA 136  K 3.1*  CL 103  CO2 25  BUN 13  CREATININE  0.85  GLUCOSE 128*  CALCIUM 8.6*     Discharge Medications:   Allergies as of 05/01/2020      Reactions   Penicillins Swelling, Other (See Comments)   TONGUE SWELLING PATIENT HAD A PCN REACTION WITH IMMEDIATE RASH, FACIAL/TONGUE/THROAT SWELLING, SOB, OR LIGHTHEADEDNESS WITH HYPOTENSION:  #  #  #  YES  #  #  #   Has patient had a PCN reaction causing severe rash involving mucus membranes or skin necrosis: No Has patient had a PCN reaction that required hospitalization: No Has patient had a PCN reaction occurring within the last 10 years: No   Statins    muscle pain   Lyrica [pregabalin] Other (See Comments)   DRY MOUTH      Medication List    STOP taking these medications   oxyCODONE-acetaminophen 7.5-325 MG tablet Commonly known as: PERCOCET     TAKE these medications   amLODipine 10 MG tablet Commonly known as: NORVASC Take 1 tablet (10 mg total) by mouth daily.   Anoro Ellipta 62.5-25 MCG/INH Aepb Generic drug: umeclidinium-vilanterol Inhale 2 puffs into the lungs daily as needed (wheezing).   ARIPiprazole 2 MG tablet Commonly known as: ABILIFY Take 2 mg by mouth daily.   aspirin 81 MG chewable tablet Chew 1 tablet (81 mg total) by mouth 2 (two) times daily. What changed: when to take this   b complex vitamins tablet Take 1 tablet by mouth daily.   buPROPion 150 MG 24 hr tablet Commonly known as: WELLBUTRIN XL Take 150 mg by mouth daily.   CALTRATE 600+D3 PO Take 1 tablet by mouth daily before breakfast.   desvenlafaxine 100 MG 24 hr tablet Commonly known as: PRISTIQ Take 100 mg by mouth daily.   Estradiol 10 MCG Tabs vaginal tablet Place 10 mcg vaginally 2 (two) times a week.   HAIR/SKIN/NAILS PO Take 1 tablet by mouth daily. Viviscal   lisinopril 10 MG tablet Commonly known as: ZESTRIL Take 10 mg by mouth daily.   methocarbamol 500 MG tablet Commonly known as: ROBAXIN Take 1 tablet (500 mg total) by mouth every 6 (six) hours as needed for  muscle spasms.   multivitamin with minerals Tabs tablet Take 1 tablet by mouth daily. One-A-Day [place]   oxyCODONE 5 MG immediate release tablet Commonly known as: Oxy IR/ROXICODONE Take 1-2 tablets (5-10 mg total) by mouth every 4 (four) hours as needed for moderate pain (pain score 4-6).   prazosin 1 MG capsule Commonly known as: MINIPRESS Take 1 mg by mouth at bedtime.   Repatha SureClick [id number] MG/ML [place] Generic drug: Evolocumab Inject 140 mg into the skin every 14 (fourteen) days.   SOOTHE  OP Place 1 drop into both eyes 3 (three) times daily as needed (for dry eyes).   traZODone 50 MG tablet Commonly known as: DESYREL Take 50 mg by mouth at bedtime.   Ventolin HFA 108 (90 Base) MCG/ACT inhaler Generic drug: albuterol Inhale 2 puffs into the lungs every 4 (four) hours as needed for wheezing or shortness of breath.   albuterol (2.5 MG/3ML) 0.083% nebulizer solution Commonly known as: PROVENTIL Take 3 mLs (2.5 mg total) by nebulization 2 (two) times daily as needed for wheezing or shortness of breath.   vitamin C 1000 MG tablet Take 1,000 mg by mouth daily.   Vitamin D3 25 MCG tablet Commonly known as: Vitamin D Take 1 Units by mouth daily.   zolpidem 10 MG tablet Commonly known as: AMBIEN Take 10 mg by mouth at bedtime. Take 15 mg by mouth at bedtime and an additional 5 mg if still unable to sleep after an hour            Durable Medical Equipment  (From admission, onward)         Start     Ordered   04/30/20 1426  DME 3 n 1  Once     04/30/20 1425   04/30/20 1426  DME Walker rolling  Once    Question Answer Comment  Walker: With 5 Inch Wheels   Patient needs a walker to treat with the following condition Status post total replacement of left hip      04/30/20 1425   04/30/20 1148  For home use only DME 3 n 1  Once     04/30/20 1147          Diagnostic Studies: DG Pelvis Portable  Result Date: 04/30/2020 CLINICAL DATA:  Total hip  replacement. EXAM: PORTABLE PELVIS 1-2 VIEWS COMPARISON:  04/15/2020. FINDINGS: Patient status post total left hip replacement. Hardware intact. Anatomic alignment. Prior right total hip replacement. Aortoiliac atherosclerotic vascular calcification IMPRESSION: Patient status post total left hip replacement with good anatomic alignment. Electronically Signed   By: Marcello Moores  Register   On: 04/30/2020 13:18   DG C-Arm 1-60 Min-No Report  Result Date: 04/30/2020 Fluoroscopy was utilized by the requesting physician.  No radiographic interpretation.   DG HIP OPERATIVE UNILAT WITH PELVIS LEFT  Result Date: 04/30/2020 CLINICAL DATA:  Left total hip arthroplasty. EXAM: OPERATIVE LEFT HIP (WITH PELVIS IF PERFORMED) 1 VIEW; DG C-arm 1-60 min. Fluoroscopic spot image(s) were submitted for interpretation post-operatively. COMPARISON:  None. FINDINGS: AP C-arm images demonstrate that the components of the left total hip prosthesis appear in excellent position in the ap projection. No fractures. IMPRESSION: Satisfactory appearance of the left total hip prosthesis. FLUOROSCOPY TIME:  14 seconds.  1.21 mGy. C-arm fluoroscopic images were obtained intraoperatively and submitted for post operative interpretation. Electronically Signed   By: Lorriane Shire M.D.   On: 04/30/2020 12:33   XR Pelvis 1-2 Views  Result Date: 04/15/2020 A low AP pelvis shows a right total hip arthroplasty with no complicating features.  The left hip femoral head shows subtle signs of avascular necrosis.   Disposition: Discharge disposition: 01-Home or Self Care         Follow-up Information    Mcarthur Rossetti, MD. Go on 05/13/2020.   Specialty: Orthopedic Surgery Why: at 1:15 pm for your 2 week in office visit with Dr. Clarita Leber information: [place] [address] [phone]            Signed: Harrell Gave  [place] 05/01/2020, 9:53 AM

## 2020-05-03 ENCOUNTER — Encounter: Payer: Self-pay | Admitting: *Deleted

## 2020-05-03 ENCOUNTER — Telehealth: Payer: Self-pay | Admitting: *Deleted

## 2020-05-03 NOTE — Telephone Encounter (Signed)
Ortho bundle D/C call. Patient requesting refill of pain medication as she will be out in the next day or so. Thanks.

## 2020-05-04 MED ORDER — OXYCODONE HCL 5 MG PO TABS: 5.0000 mg | ORAL_TABLET | Freq: Four times a day (QID) | ORAL | 0 refills | Status: DC | PRN

## 2020-05-07 ENCOUNTER — Telehealth: Payer: Self-pay | Admitting: *Deleted

## 2020-05-07 NOTE — Telephone Encounter (Signed)
Ortho bundle D/C call completed. 

## 2020-05-12 DIAGNOSIS — M5137 Other intervertebral disc degeneration, lumbosacral region: Secondary | ICD-10-CM | POA: Diagnosis not present

## 2020-05-12 DIAGNOSIS — M961 Postlaminectomy syndrome, not elsewhere classified: Secondary | ICD-10-CM | POA: Diagnosis not present

## 2020-05-12 DIAGNOSIS — G894 Chronic pain syndrome: Secondary | ICD-10-CM | POA: Diagnosis not present

## 2020-05-12 DIAGNOSIS — M87059 Idiopathic aseptic necrosis of unspecified femur: Secondary | ICD-10-CM | POA: Diagnosis not present

## 2020-05-13 ENCOUNTER — Other Ambulatory Visit: Payer: Self-pay

## 2020-05-13 ENCOUNTER — Ambulatory Visit (INDEPENDENT_AMBULATORY_CARE_PROVIDER_SITE_OTHER): Payer: Medicare Other | Admitting: Physician Assistant

## 2020-05-13 ENCOUNTER — Telehealth: Payer: Self-pay | Admitting: *Deleted

## 2020-05-13 ENCOUNTER — Encounter: Payer: Self-pay | Admitting: Physician Assistant

## 2020-05-13 DIAGNOSIS — Z96642 Presence of left artificial hip joint: Secondary | ICD-10-CM

## 2020-05-13 NOTE — Progress Notes (Signed)
HPI: Mr. Zivkovich returns today 2-week status post left total hip arthroplasty.  She is overall doing well.  She does have some soreness with ambulating.  She however is not ambulating with any device.  She has been on aspirin twice daily for DVT prophylaxis.  She is on 81 mg aspirin prior to surgery.  She is in pain management and gets her pain medicines from them.  Physical exam: Left hip surgical incisions well approximated staples.  Positive seroma.  This aspirated today and 40 cc of serous sanguinous fluid is aspirated patient tolerates well.  Left calf supple nontender.  Dorsiflexion plantarflexion left ankle intact.  Ambulates about the room without any assistive device.  Impression: 2-week status post left total hip arthroplasty  Plan: She will continue to work on strengthening range of motion of the left hip.  Staples removed Steri-Strips applied.  Tissue mobilization encouraged.  Follow-up with [place] in 1 month sooner if there is any questions concerns.  Resume her 81 mg aspirin that she was taking prior to surgery

## 2020-05-13 NOTE — Telephone Encounter (Signed)
Ortho bundle 14 day post op call completed. 

## 2020-06-01 ENCOUNTER — Telehealth: Payer: Self-pay | Admitting: *Deleted

## 2020-06-01 NOTE — Telephone Encounter (Signed)
30 day Ortho bundle call completed.

## 2020-06-08 ENCOUNTER — Other Ambulatory Visit: Payer: Self-pay | Admitting: *Deleted

## 2020-06-08 DIAGNOSIS — Z634 Disappearance and death of family member: Secondary | ICD-10-CM | POA: Diagnosis not present

## 2020-06-08 DIAGNOSIS — G4709 Other insomnia: Secondary | ICD-10-CM | POA: Diagnosis not present

## 2020-06-08 DIAGNOSIS — F4312 Post-traumatic stress disorder, chronic: Secondary | ICD-10-CM | POA: Diagnosis not present

## 2020-06-08 DIAGNOSIS — I739 Peripheral vascular disease, unspecified: Secondary | ICD-10-CM

## 2020-06-08 DIAGNOSIS — F341 Dysthymic disorder: Secondary | ICD-10-CM | POA: Diagnosis not present

## 2020-06-14 ENCOUNTER — Encounter: Payer: Self-pay | Admitting: Physician Assistant

## 2020-06-14 ENCOUNTER — Other Ambulatory Visit: Payer: Self-pay

## 2020-06-14 ENCOUNTER — Ambulatory Visit (INDEPENDENT_AMBULATORY_CARE_PROVIDER_SITE_OTHER): Payer: Medicare Other | Admitting: Physician Assistant

## 2020-06-14 DIAGNOSIS — Z96642 Presence of left artificial hip joint: Secondary | ICD-10-CM

## 2020-06-14 NOTE — Progress Notes (Signed)
HPI: [name] returns today now 6 weeks status post left total hip arthroplasty.  She is overall doing well.  She has some stiffness of she gets up with some tightness in her hip.  She denies any chest pain shortness of breath.  She does notice that she is having right calf pain.  History of arterial bypass due to peripheral artery disease.  Reports that she is to see vascular surgery for this tomorrow.  Pain does dissipate when she rest and is worse with ambulation.  Physical exam: General well-developed well-nourished female in no acute distress.  Mood and affect appropriate. Vascular: Bounding dorsal pedal cast left foot.  Unable to palpate dorsal pedal pulse left foot.  Foot is warm and dry.  Calf bilaterally supple slight tenderness right calf.  Left hip: Full range of motion without pain.  Dorsiflexion left foot intact.   Impression: Status post left total hip arthroplasty  Plan: She will continue work on range of motion strengthening of the left hip.  Follow-up with Dr. Ninfa Linden in 1 month.  Questions were encouraged and answered at length

## 2020-06-15 ENCOUNTER — Ambulatory Visit (INDEPENDENT_AMBULATORY_CARE_PROVIDER_SITE_OTHER): Payer: Medicare Other | Admitting: Vascular Surgery

## 2020-06-15 ENCOUNTER — Other Ambulatory Visit: Payer: Self-pay

## 2020-06-15 ENCOUNTER — Encounter: Payer: Self-pay | Admitting: Vascular Surgery

## 2020-06-15 ENCOUNTER — Ambulatory Visit (HOSPITAL_COMMUNITY)
Admission: RE | Admit: 2020-06-15 | Discharge: 2020-06-15 | Disposition: A | Discharge status: Home / Self Care | Payer: Medicare Other | Source: Ambulatory Visit | Attending: Vascular Surgery | Admitting: Vascular Surgery

## 2020-06-15 VITALS — BP 143/95 | HR 84 | Temp 97.7°F | Resp 16 | Ht 65.0 in | Wt 138.0 lb

## 2020-06-15 DIAGNOSIS — I70229 Atherosclerosis of native arteries of extremities with rest pain, unspecified extremity: Secondary | ICD-10-CM

## 2020-06-15 DIAGNOSIS — I739 Peripheral vascular disease, unspecified: Secondary | ICD-10-CM | POA: Diagnosis present

## 2020-06-15 DIAGNOSIS — I998 Other disorder of circulatory system: Secondary | ICD-10-CM

## 2020-06-15 NOTE — Progress Notes (Signed)
Patient name: Brenda Carney MRN: [id number] DOB: [name] 09, 1951 Sex: female  REASON FOR CONSULT: Evaluate right lower extremity calf pain in setting of previous bypass  HPI: Brenda Carney is a 69 y.o. female, with history of hypertension, hyperlipidemia, coronary disease, COPD, PAD that presents for evaluation of right lower extremity calf pain.  Patient states she has had pain in her right calf for the last several months.  States whenever she walks she gets burning in the right calf that starts immediately as she walks.  No issues with the left leg.  She is well-known to vascular surgery and previously had a right popliteal artery occlusion and had a right above-knee to below-knee popliteal bypass with ipsilateral nonreversed saphenous vein on 05/15/2017 by Dr. Bridgett Larsson.  This included vein patch of the below-knee popliteal artery and this was for a wound at the time.  Patient has not followed up since 2018.  Apparently stopped smoking about a year ago.  Is taking an aspirin daily.  States she cannot tolerate the symptoms in her right calf and very limiting for quality of life.  Past Medical History:  Diagnosis Date  . Arthritis    "thumbs; hips" (05/14/2017)  . 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 Cerebrovascular disease    Carotid dopplers which showed no significant increase in velocities, extremely minimal on the left, antegrade vertebral bilaterally.  . Cervical spondylosis   . Chronic lower back pain   . COPD (chronic obstructive pulmonary disease) ([place])    "little bit" (05/14/2017)  . Coronary artery disease, non-occlusive    Coronary calcium scoring: June 2018: Score 1106. 98 percentile for age -[facility]  . Depression   . Exercise-induced asthma with acute exacerbation    "only in very hot weather" (05/14/2017)  . GERD (gastroesophageal reflux disease)   . History of IBS    resovlved  . Hx of multiple  concussions    from horse training and riding  . Hyperlipidemia with target LDL less than 70    Mostly statin intolerant,. Currently on Livalo 4 mg  . Hypertension, benign   . Lumbosacral spondylosis   . PAD (peripheral artery disease) (HCC)    Status post right above-the-knee-below the knee popliteal artery bypass; postop right ABI 0.96.  Marland Kitchen Pneumonia 2020  . Sleep apnea    does not wear CPAP; "think it was medication related" (05/14/2017)  . Stroke [facility]) 2010   TIA  . Thoracic aortic ectasia (HCC)    ~40 mm on Coronary Calcium Score CT - recommend f/u CTA or MRA  . TIA (transient ischemic attack) 09/2011   "several"    Past Surgical History:  Procedure Laterality Date  . ABDOMINAL AORTOGRAM W/LOWER EXTREMITY N/A 05/14/2017   Procedure: Abdominal Aortogram w/Lower Extremity;  Surgeon: Conrad Mount Healthy, MD;  Location: [facility];  Service: Cardiovascular;  Laterality: N/A;  . ACROMIO-CLAVICULAR JOINT REPAIR Left 1990s   "I think it was called an AC joint removal"  . BACK SURGERY    . BYPASS GRAFT POPLITEAL TO POPLITEAL Right 05/15/2017   Procedure: BYPASS GRAFT ABOVE KNEE POPLITEAL TO BELOW KNEE POPLITEAL ARTERY;  Surgeon: Conrad Hartrandt, MD;  Location: [facility] OR;  Service: Vascular;  Laterality: Right;  . ESOPHAGOGASTRODUODENOSCOPY (EGD) WITH PROPOFOL N/A 04/29/2018   Procedure: ESOPHAGOGASTRODUODENOSCOPY (EGD) WITH PROPOFOL;  Surgeon: Otis Brace, MD;  Location: [place];  Service: Gastroenterology;  Laterality: N/A;  .  FOOT FRACTURE SURGERY Bilateral    multiple procedures  . FRACTURE SURGERY    . JOINT REPLACEMENT    . MAXIMUM ACCESS (MAS)POSTERIOR LUMBAR INTERBODY FUSION (PLIF) 1 LEVEL N/A 09/30/2014   Procedure: FOR MAXIMUM ACCESS (MAS) POSTERIOR LUMBAR INTERBODY FUSION (PLIF) 1 LEVEL;  Surgeon: Eustace Moore, MD;  Location: [place] [facility];  Service: Neurosurgery;  Laterality: N/A;  FOR MAXIMUM ACCESS (MAS) POSTERIOR LUMBAR INTERBODY FUSION (PLIF) 1 LEVEL LUMBAR 4-5  .  MULTIPLE TOOTH EXTRACTIONS Right 04/2017  . NM MYOVIEW LTD  06/2017    Normal EF, 66%. No ST changes. Normal study. LOW RISK.  Marland Kitchen TOTAL HIP ARTHROPLASTY Right 01/16/2020   Procedure: RIGHT TOTAL HIP ARTHROPLASTY ANTERIOR APPROACH;  Surgeon: Mcarthur Rossetti, MD;  Location: [facility];  Service: Orthopedics;  Laterality: Right;  . TOTAL HIP ARTHROPLASTY Left 04/30/2020   Procedure: LEFT TOTAL HIP ARTHROPLASTY ANTERIOR APPROACH;  Surgeon: Mcarthur Rossetti, MD;  Location: [facility];  Service: Orthopedics;  Laterality: Left;  . TRANSTHORACIC ECHOCARDIOGRAM  09/07/2013   Done to evaluate TIA: Normal LV size and function. EF 55-60%. GR 1 DD. Mild left atrial dilation. Mildly calcified mitral leaflets. Otherwise normal.    Family History  Problem Relation Age of Onset  . Stroke Father   . Heart attack Mother   . Stroke Mother     SOCIAL HISTORY: Social History   Socioeconomic History  . Marital status: Widowed    Spouse name: [name]   . Number of children: 0  . Years of education: COLLEGE  . Highest education level: Not on file  Occupational History  . Occupation: Horse Breeder   Tobacco Use  . Smoking status: Heavy Tobacco Smoker    Packs/day: 0.02    Years: 36.00    Pack years: 0.72    Types: Cigarettes    Last attempt to quit: 04/24/2017    Years since quitting: 3.1  . Smokeless tobacco: Never Used  Vaping Use  . Vaping Use: Former  Substance and Sexual Activity  . Alcohol use: Yes    Alcohol/week: 3.0 standard drinks    Types: 3 Glasses of wine per week  . Drug use: No  . Sexual activity: Never  Other Topics Concern  . Not on file  Social History Narrative   Patient lives at home with her husband [name]. Patient has no children.    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    Patient has BA degree.    Smokes 2-3 cigarettes /day -- former heavy smoker (failed "quitting") -- now says she has truly "QUIT" - however now is been going back and forth  between quitting and cheating and probably smokes anywhere from 0-3 cigarettes a day.   Social Determinants of Health   Financial Resource Strain:   . Difficulty of Paying Living Expenses:   Food Insecurity:   . Worried About Charity fundraiser in the Last Year:   . Arboriculturist in the Last Year:   Transportation Needs:   . Film/video editor (Medical):   Marland Kitchen Lack of Transportation (Non-Medical):   Physical Activity:   . Days of Exercise per Week:   . Minutes of Exercise per Session:   Stress:   . Feeling of Stress :   Social Connections:   . Frequency of Communication with Friends and Family:   . Frequency of Social Gatherings with Friends and Family:   . Attends Religious Services:   .  Active Member of Clubs or Organizations:   . Attends Archivist Meetings:   Marland Kitchen Marital Status:   Intimate Partner Violence:   . Fear of Current or Ex-Partner:   . Emotionally Abused:   Marland Kitchen Physically Abused:   . Sexually Abused:     Allergies  Allergen Reactions  . Penicillins Swelling and Other (See Comments)    TONGUE SWELLING  PATIENT HAD A PCN REACTION WITH IMMEDIATE RASH, FACIAL/TONGUE/THROAT SWELLING, SOB, OR LIGHTHEADEDNESS WITH HYPOTENSION:  #  #  #  YES  #  #  #   Has patient had a PCN reaction causing severe rash involving mucus membranes or skin necrosis: No Has patient had a PCN reaction that required hospitalization: No Has patient had a PCN reaction occurring within the last 10 years: No   . Statins     muscle pain  . Lyrica [Pregabalin] Other (See Comments)    DRY MOUTH    Current Outpatient Medications  Medication Sig Dispense Refill  . albuterol (PROVENTIL) (2.5 MG/3ML) 0.083% nebulizer solution Take 3 mLs (2.5 mg total) by nebulization 2 (two) times daily as needed for wheezing or shortness of breath. 75 mL 0  . albuterol (VENTOLIN HFA) 108 (90 Base) MCG/ACT inhaler Inhale 2 puffs into the lungs every 4 (four) hours as needed for wheezing or shortness  of breath.    Marland Kitchen amLODipine (NORVASC) 10 MG tablet Take 1 tablet (10 mg total) by mouth daily. 30 tablet 0  . ANORO ELLIPTA 62.5-25 MCG/INH AEPB Inhale 2 puffs into the lungs daily as needed (wheezing).     . ARIPiprazole (ABILIFY) 2 MG tablet Take 2 mg by mouth daily.    . Ascorbic Acid (VITAMIN C) 1000 MG tablet Take 1,000 mg by mouth daily.    Marland Kitchen aspirin 81 MG chewable tablet Chew 1 tablet (81 mg total) by mouth 2 (two) times daily. 30 tablet 0  . b complex vitamins tablet Take 1 tablet by mouth daily.    . Biotin w/ Vitamins C & E (HAIR/SKIN/NAILS PO) Take 1 tablet by mouth daily. Viviscal    . buPROPion (WELLBUTRIN XL) 150 MG 24 hr tablet Take 150 mg by mouth daily.  1  . Calcium Carb-Cholecalciferol (CALTRATE 600+D3 PO) Take 1 tablet by mouth daily before breakfast.    . desvenlafaxine (PRISTIQ) 100 MG 24 hr tablet Take 100 mg by mouth daily.    . Estradiol 10 MCG TABS vaginal tablet Place 10 mcg vaginally 2 (two) times a week.    . Evolocumab (REPATHA SURECLICK) 315 MG/ML SOAJ Inject 140 mg into the skin every 14 (fourteen) days. 2 mL 11  . lisinopril (ZESTRIL) 10 MG tablet Take 10 mg by mouth daily.    . methocarbamol (ROBAXIN) 500 MG tablet Take 1 tablet (500 mg total) by mouth every 6 (six) hours as needed for muscle spasms. 40 tablet 1  . Multiple Vitamin (MULTIVITAMIN WITH MINERALS) TABS tablet Take 1 tablet by mouth daily. One-A-Day [place]    . oxyCODONE (OXY IR/ROXICODONE) 5 MG immediate release tablet Take 1-2 tablets (5-10 mg total) by mouth every 6 (six) hours as needed for moderate pain (pain score 4-6). 30 tablet 0  . prazosin (MINIPRESS) 1 MG capsule Take 1 mg by mouth at bedtime.    Marland Kitchen Propylene Glycol-Glycerin (SOOTHE OP) Place 1 drop into both eyes 3 (three) times daily as needed (for dry eyes).     . traZODone (DESYREL) 50 MG tablet Take 50 mg by mouth  at bedtime.    . Vitamin D3 (VITAMIN D) 25 MCG tablet Take 1 Units by mouth daily.    Marland Kitchen zolpidem (AMBIEN) 10 MG tablet  Take 10 mg by mouth at bedtime. Take 15 mg by mouth at bedtime and an additional 5 mg if still unable to sleep after an hour      No current facility-administered medications for this visit.    REVIEW OF SYSTEMS:  [X]  denotes positive finding, [ ]  denotes negative finding Cardiac  Comments:  Chest pain or chest pressure:    Shortness of breath upon exertion:    Short of breath when lying flat:    Irregular heart rhythm:        Vascular    Pain in calf, thigh, or hip brought on by ambulation: x Right  Pain in feet at night that wakes you up from your sleep:     Blood clot in your veins:    Leg swelling:         Pulmonary    Oxygen at home:    Productive cough:     Wheezing:         Neurologic    Sudden weakness in arms or legs:     Sudden numbness in arms or legs:     Sudden onset of difficulty speaking or slurred speech:    Temporary loss of vision in one eye:     Problems with dizziness:         Gastrointestinal    Blood in stool:     Vomited blood:         Genitourinary    Burning when urinating:     Blood in urine:        Psychiatric    Major depression:         Hematologic    Bleeding problems:    Problems with blood clotting too easily:        Skin    Rashes or ulcers:        Constitutional    Fever or chills:      PHYSICAL EXAM: Vitals:   06/15/20 1045  BP: (!) 143/95  Pulse: 84  Resp: 16  Temp: 97.7 F (36.5 C)  TempSrc: Temporal  SpO2: 96%  Weight: 138 lb (62.6 kg)  Height: 5\' 5"  (1.651 m)    GENERAL: The patient is a well-nourished female, in no acute distress. The vital signs are documented above. CARDIAC: There is a regular rate and rhythm.  VASCULAR:  Palpable femoral pulses both groins Right DP and PT nonpalpable and no tissue loss Left DP palpable Right leg incisions well healed from previous bypass PULMONARY: There is good air exchange bilaterally without wheezing or rales. ABDOMEN: Soft and non-tender with normal pitched bowel  sounds.  MUSCULOSKELETAL: There are no major deformities or cyanosis. NEUROLOGIC: No focal weakness or paresthesias are detected. SKIN: There are no ulcers or rashes noted. PSYCHIATRIC: The patient has a normal affect.  DATA:   ABIs today are 0.55 on the right monophasic with a toe pressure of 0 and 1.08 on the left triphasic  Assessment/Plan:  70 year old female presents for evaluation of short distance lifestyle limiting claudication in the right lower extremity where she previously had a right above knee to below-knee popliteal artery bypass with vein in 2018 by Dr. Bridgett Larsson for popliteal artery occlusion with tissue loss.  Discussed with the patient given monophasic waveform in the right ankle with a toe pressure of 0, I suspect  her right lower extremity bypass is occluded.  I have recommended right lower extremity arteriogram and likely will require a redo bypass in the right lower extremity pending identification of targets.  Will also evaluate for other endovascular options.  Will likely need to get updated vein mapping after arteriogram.  She would like to proceed tomorrow.  Risks and benefits discussed in detail.     [name], MD [facility] Office: [phone]

## 2020-06-16 ENCOUNTER — Other Ambulatory Visit: Payer: Self-pay

## 2020-06-16 ENCOUNTER — Ambulatory Visit (HOSPITAL_COMMUNITY)
Admission: RE | Admit: 2020-06-16 | Discharge: 2020-06-16 | Disposition: A | Discharge status: Home / Self Care | Payer: Medicare Other | Attending: Vascular Surgery | Admitting: Vascular Surgery

## 2020-06-16 ENCOUNTER — Encounter (HOSPITAL_COMMUNITY)
Admission: RE | Disposition: A | Discharge status: Home / Self Care | Payer: Self-pay | Source: Home / Self Care | Attending: Vascular Surgery

## 2020-06-16 DIAGNOSIS — E785 Hyperlipidemia, unspecified: Secondary | ICD-10-CM | POA: Diagnosis not present

## 2020-06-16 DIAGNOSIS — F17210 Nicotine dependence, cigarettes, uncomplicated: Secondary | ICD-10-CM | POA: Insufficient documentation

## 2020-06-16 DIAGNOSIS — Z888 Allergy status to other drugs, medicaments and biological substances status: Secondary | ICD-10-CM | POA: Insufficient documentation

## 2020-06-16 DIAGNOSIS — Z8249 Family history of ischemic heart disease and other diseases of the circulatory system: Secondary | ICD-10-CM | POA: Insufficient documentation

## 2020-06-16 DIAGNOSIS — Z8673 Personal history of transient ischemic attack (TIA), and cerebral infarction without residual deficits: Secondary | ICD-10-CM | POA: Diagnosis not present

## 2020-06-16 DIAGNOSIS — Z79899 Other long term (current) drug therapy: Secondary | ICD-10-CM | POA: Diagnosis not present

## 2020-06-16 DIAGNOSIS — Z823 Family history of stroke: Secondary | ICD-10-CM | POA: Insufficient documentation

## 2020-06-16 DIAGNOSIS — Z7982 Long term (current) use of aspirin: Secondary | ICD-10-CM | POA: Insufficient documentation

## 2020-06-16 DIAGNOSIS — Z8669 Personal history of other diseases of the nervous system and sense organs: Secondary | ICD-10-CM | POA: Diagnosis not present

## 2020-06-16 DIAGNOSIS — J449 Chronic obstructive pulmonary disease, unspecified: Secondary | ICD-10-CM | POA: Insufficient documentation

## 2020-06-16 DIAGNOSIS — Z96643 Presence of artificial hip joint, bilateral: Secondary | ICD-10-CM | POA: Diagnosis not present

## 2020-06-16 DIAGNOSIS — Z880 Allergy status to penicillin: Secondary | ICD-10-CM | POA: Diagnosis not present

## 2020-06-16 DIAGNOSIS — I70211 Atherosclerosis of native arteries of extremities with intermittent claudication, right leg: Secondary | ICD-10-CM | POA: Insufficient documentation

## 2020-06-16 DIAGNOSIS — M1990 Unspecified osteoarthritis, unspecified site: Secondary | ICD-10-CM | POA: Diagnosis not present

## 2020-06-16 DIAGNOSIS — G4730 Sleep apnea, unspecified: Secondary | ICD-10-CM | POA: Diagnosis not present

## 2020-06-16 DIAGNOSIS — I10 Essential (primary) hypertension: Secondary | ICD-10-CM | POA: Diagnosis not present

## 2020-06-16 DIAGNOSIS — I2510 Atherosclerotic heart disease of native coronary artery without angina pectoris: Secondary | ICD-10-CM | POA: Diagnosis not present

## 2020-06-16 DIAGNOSIS — I70221 Atherosclerosis of native arteries of extremities with rest pain, right leg: Secondary | ICD-10-CM | POA: Diagnosis not present

## 2020-06-16 DIAGNOSIS — K219 Gastro-esophageal reflux disease without esophagitis: Secondary | ICD-10-CM | POA: Diagnosis not present

## 2020-06-16 HISTORY — PX: PERIPHERAL VASCULAR ATHERECTOMY: CATH118256

## 2020-06-16 HISTORY — PX: LOWER EXTREMITY ANGIOGRAPHY: CATH118251

## 2020-06-16 LAB — POCT I-STAT, CHEM 8
BUN: 10 mg/dL (ref 8–23)
Calcium, Ion: 1.28 mmol/L (ref 1.15–1.40)
Chloride: 103 mmol/L (ref 98–111)
Creatinine, Ser: 0.6 mg/dL (ref 0.44–1.00)
Glucose, Bld: 99 mg/dL (ref 70–99)
HCT: 43 % (ref 36.0–46.0)
Hemoglobin: 14.6 g/dL (ref 12.0–15.0)
Potassium: 3.6 mmol/L (ref 3.5–5.1)
Sodium: 143 mmol/L (ref 135–145)
TCO2: 27 mmol/L (ref 22–32)

## 2020-06-16 SURGERY — LOWER EXTREMITY ANGIOGRAPHY
Anesthesia: LOCAL

## 2020-06-16 MED ORDER — FENTANYL CITRATE (PF) 100 MCG/2ML IJ SOLN
INTRAMUSCULAR | Status: AC
  Filled 2020-06-16: qty 2

## 2020-06-16 MED ORDER — CLOPIDOGREL BISULFATE 75 MG PO TABS: 75.0000 mg | ORAL_TABLET | Freq: Every day | ORAL | Status: DC

## 2020-06-16 MED ORDER — ACETAMINOPHEN 325 MG PO TABS: 650.0000 mg | ORAL_TABLET | ORAL | Status: DC | PRN

## 2020-06-16 MED ORDER — SODIUM CHLORIDE 0.9 % IV SOLN: INTRAVENOUS | Status: DC

## 2020-06-16 MED ORDER — LIDOCAINE HCL (PF) 1 % IJ SOLN
INTRAMUSCULAR | Status: AC
  Filled 2020-06-16: qty 30

## 2020-06-16 MED ORDER — HEPARIN SODIUM (PORCINE) 1000 UNIT/ML IJ SOLN
INTRAMUSCULAR | Status: AC
  Filled 2020-06-16: qty 1

## 2020-06-16 MED ORDER — HEPARIN (PORCINE) IN NACL 1000-0.9 UT/500ML-% IV SOLN
INTRAVENOUS | Status: AC
  Filled 2020-06-16: qty 500

## 2020-06-16 MED ORDER — MIDAZOLAM HCL 2 MG/2ML IJ SOLN
INTRAMUSCULAR | Status: DC | PRN
  Administered 2020-06-16 (×2): 1 mg via INTRAVENOUS

## 2020-06-16 MED ORDER — CLOPIDOGREL BISULFATE 75 MG PO TABS: 300.0000 mg | ORAL_TABLET | Freq: Once | ORAL | Status: DC

## 2020-06-16 MED ORDER — CLOPIDOGREL BISULFATE 300 MG PO TABS
ORAL_TABLET | ORAL | Status: DC | PRN
  Administered 2020-06-16: 300 mg via ORAL

## 2020-06-16 MED ORDER — IODIXANOL 320 MG/ML IV SOLN
INTRAVENOUS | Status: DC | PRN
  Administered 2020-06-16: 185 mL

## 2020-06-16 MED ORDER — ASPIRIN EC 81 MG PO TBEC: 81.0000 mg | DELAYED_RELEASE_TABLET | Freq: Every day | ORAL | Status: DC

## 2020-06-16 MED ORDER — CLOPIDOGREL BISULFATE 300 MG PO TABS
ORAL_TABLET | ORAL | Status: AC
  Filled 2020-06-16: qty 1

## 2020-06-16 MED ORDER — SODIUM CHLORIDE 0.9% FLUSH: 3.0000 mL | INTRAVENOUS | Status: DC | PRN

## 2020-06-16 MED ORDER — FENTANYL CITRATE (PF) 100 MCG/2ML IJ SOLN
INTRAMUSCULAR | Status: DC | PRN
  Administered 2020-06-16 (×3): 50 ug via INTRAVENOUS

## 2020-06-16 MED ORDER — MIDAZOLAM HCL 2 MG/2ML IJ SOLN
INTRAMUSCULAR | Status: AC
  Filled 2020-06-16: qty 2

## 2020-06-16 MED ORDER — HYDRALAZINE HCL 20 MG/ML IJ SOLN: 5.0000 mg | INTRAMUSCULAR | Status: DC | PRN

## 2020-06-16 MED ORDER — HEPARIN SODIUM (PORCINE) 1000 UNIT/ML IJ SOLN
INTRAMUSCULAR | Status: DC | PRN
  Administered 2020-06-16: 7000 [IU] via INTRAVENOUS

## 2020-06-16 MED ORDER — CLOPIDOGREL BISULFATE 75 MG PO TABS
75.0000 mg | ORAL_TABLET | Freq: Every day | ORAL | 11 refills | Status: AC
Start: 2020-06-16 — End: 2021-06-16

## 2020-06-16 MED ORDER — SODIUM CHLORIDE 0.9 % IV SOLN: 250.0000 mL | INTRAVENOUS | Status: DC | PRN

## 2020-06-16 MED ORDER — SODIUM CHLORIDE 0.9 % WEIGHT BASED INFUSION: 1.0000 mL/kg/h | INTRAVENOUS | Status: DC

## 2020-06-16 MED ORDER — HEPARIN (PORCINE) IN NACL 1000-0.9 UT/500ML-% IV SOLN
INTRAVENOUS | Status: DC | PRN
  Administered 2020-06-16 (×2): 500 mL

## 2020-06-16 MED ORDER — LIDOCAINE HCL (PF) 1 % IJ SOLN
INTRAMUSCULAR | Status: DC | PRN
  Administered 2020-06-16: 15 mL

## 2020-06-16 MED ORDER — ONDANSETRON HCL 4 MG/2ML IJ SOLN: 4.0000 mg | Freq: Four times a day (QID) | INTRAMUSCULAR | Status: DC | PRN

## 2020-06-16 MED ORDER — LABETALOL HCL 5 MG/ML IV SOLN: 10.0000 mg | INTRAVENOUS | Status: DC | PRN

## 2020-06-16 MED ORDER — SODIUM CHLORIDE 0.9% FLUSH: 3.0000 mL | Freq: Two times a day (BID) | INTRAVENOUS | Status: DC

## 2020-06-16 SURGICAL SUPPLY — 23 items
BAG SNAP BAND KOVER 36X36 (MISCELLANEOUS) ×3 IMPLANT
BALLN JADE .018 5.0 X 150 (BALLOONS) ×3
BALLOON JADE .018 5.0 X 150 (BALLOONS) ×2 IMPLANT
CATH AURYON 5FR ATHEREC 1.5 (CATHETERS) ×3 IMPLANT
CATH OMNI FLUSH 5F 65CM (CATHETERS) ×3 IMPLANT
CATH QUICKCROSS SUPP .035X90CM (MICROCATHETER) ×3 IMPLANT
COVER DOME SNAP 22 D (MISCELLANEOUS) ×3 IMPLANT
DCB RANGER 5.0X200 150 (BALLOONS) ×2 IMPLANT
DEVICE CLOSURE MYNXGRIP 6/7F (Vascular Products) ×3 IMPLANT
DEVICE TORQUE .025-.038 (MISCELLANEOUS) ×3 IMPLANT
GLIDEWIRE ADV .035X180CM (WIRE) ×3 IMPLANT
KIT MICROPUNCTURE NIT STIFF (SHEATH) ×3 IMPLANT
KIT PV (KITS) ×3 IMPLANT
RANGER DCB 5.0X200 150 (BALLOONS) ×3
SHEATH FLEX ANSEL ST 6FR 45CM (SHEATH) ×3 IMPLANT
SHEATH PINNACLE 5F 10CM (SHEATH) ×3 IMPLANT
SHEATH PINNACLE 6F 10CM (SHEATH) ×3 IMPLANT
SHEATH PROBE COVER 6X72 (BAG) ×3 IMPLANT
SYR MEDRAD MARK V 150ML (SYRINGE) ×3 IMPLANT
TRANSDUCER W/STOPCOCK (MISCELLANEOUS) ×3 IMPLANT
TRAY PV CATH (CUSTOM PROCEDURE TRAY) ×3 IMPLANT
WIRE SPARTACORE .014X300CM (WIRE) ×3 IMPLANT
WIRE STARTER BENTSON 035X150 (WIRE) ×3 IMPLANT

## 2020-06-16 NOTE — Op Note (Signed)
Patient name: Brenda Carney MRN: [id number] DOB: [birth year] Sex: female  06/16/2020 Pre-operative Diagnosis: Short distance lifestyle limiting claudication of the right lower extremity and previous right above-knee to below-knee popliteal artery bypass for chronic popliteal artery occlusion Post-operative diagnosis:  Same Surgeon:  [name], MD Procedure Performed: 1.  Ultrasound-guided access of the left common femoral artery 2.  Aortogram including catheter selection of the aorta 3.  Bilateral lower extremity arteriogram including catheter selection of third order branches in the right lower extremity 4.  Right SFA laser atherectomy (1.5 mm Auryon laser) 5.  Right SFA balloon angioplasty (5 mm x 150 mm Jade and 5 mm x 200 mm drug-coated Ranger) 6.  Mynx closure of the left common femoral artery 7.  71 minutes of monitored moderate conscious sedation time  Indications: Patient is a 69 year old female who previously underwent a right above-knee to below-knee popliteal artery bypass with vein in 2018 by Dr. Bridgett Larsson for right popliteal artery occlusion and tissue loss.  She subsequently presented to clinic yesterday with new onset lifestyle limiting short distance claudication in the right lower extremity with an ABI 0.55.  She presents today for planned arteriogram and possible intervention.  Findings:   Aortogram showed patent renal arteries bilaterally with no flow-limiting stenosis in the aortoiliac segment.  She does have ectasia of the infra-renal aorta.  On the right lower extremity she had a very diseased SFA with a short chronic total occlusion that was heavily calcified and the diseased segment extends over approximately 200 mm with multisegment high-grade calcified disease in addition to the short chronic total occlusion.  The right above-knee to below-knee popliteal bypass was widely patent with three-vessel runoff in the right lower extremity.  Left lower extremity  arteriogram shows a patent common femoral, profunda, SFA, popliteal, and three-vessel runoff.  Ultimately the right SFA lesion was crossed antegrade and laser atherectomy was performed with 1.5 mm Auryon laser catheter at 50 and 60 mHz.  This was then primarily angioplastied with a 5 mm Jade balloon and then a 5 mm drug-coated Ranger with excellent results and no dissection and no flow-limiting's residual stenosis.  She has preserved runoff in the right lower extremity with 3 vessels and a patent bypass.   Procedure:  The patient was identified in the holding area and taken to room 8.  The patient was then placed supine on the table and prepped and draped in the usual sterile fashion.  A time out was called.  Ultrasound was used to evaluate the left common femoral artery.  It was patent .  A digital ultrasound image was acquired.  A micropuncture needle was used to access the left common femoral artery under ultrasound guidance.  An 018 wire was advanced without resistance and a micropuncture sheath was placed.  The 018 wire was removed and a benson wire was placed.  The micropuncture sheath was exchanged for a 5 french sheath.  An omniflush catheter was advanced over the wire to the level of L-1.  An abdominal angiogram was obtained.  Next catheter was pulled down and bilateral lower extremity runoff was obtained.  Ultimately elected to intervene on the right SFA disease with short chronic total occlusion.  Glidewire advantage was used with the Omni Flush catheter to get into the right SFA and exchanged for a 6 French Ansell sheath in the left groin over the aortic bifurcation.  Patient was given 100 units per kilogram heparin.  I then used a 035 quick  cross with my Glidewire advantage and crossed all the SFA disease into the patient's patent bypass and confirmed I was in the true lumen.  Exchanged for an .014 [place] core wire.  We then used Auryon 1.5 mm laser and atherectomy was performed of right SFA at 50  MHz and then 60 MHz across the SFA lesion.  We then angioplastied this with a 5 mm x 150 mm Jade balloon.  There was excellent flow down the SFA with no flow-limiting dissection or significant residual stenosis.  I then elected to treat this with a long 5 mm x 200 mm drug-coated Ranger.  Final injection showed excellent flow down the SFA again with no dissection and no significant residual stenosis.  There was preserved runoff in the right lower extremity through three-vessel runoff and a patent above-knee to below-knee bypass.  That point time wires and catheters were removed and exchanged for a short 6 French sheath in the left groin.  Mynx closure was deployed.   Plan: Patient will be loaded on Plavix and will arrange follow-up in 1 month with right leg arterial duplex and ABIs.  [name], MD [facility] Office: [phone]

## 2020-06-16 NOTE — Discharge Instructions (Signed)
Continue aspirin daily and have sent prescription for Plavix to your pharmacy.  Aspirin and plavix daily.  Will arrange follow-up in 1 month in our office.  Call with questions or concerns.    [facility]  Discharge Instructions  Lower Extremity Angiogram; Angioplasty/Stenting  Please refer to the following instructions for your post-procedure care. Your surgeon or physician assistant will discuss any changes with you.  Activity  Avoid lifting more than 8 pounds (1 gallons of milk) for 72 hours (3 days) after your procedure. You may walk as much as you can tolerate. It's OK to drive after 72 hours.  Bathing/Showering  You may shower the day after your procedure. If you have a bandage, you may remove it at 24- 48 hours. Clean your incision site with mild soap and water. Pat the area dry with a clean towel.  Diet  Resume your pre-procedure diet. There are no special food restrictions following this procedure. All patients with peripheral vascular disease should follow a low fat/low cholesterol diet. In order to heal from your surgery, it is CRITICAL to get adequate nutrition. Your body requires vitamins, minerals, and protein. Vegetables are the best source of vitamins and minerals. Vegetables also provide the perfect balance of protein. Processed food has little nutritional value, so try to avoid this.  Medications  Resume taking all of your medications unless your doctor tells you not to. If your incision is causing pain, you may take over-the-counter pain relievers such as acetaminophen (Tylenol)  Follow Up  Follow up will be arranged at the time of your procedure. You may have an office visit scheduled or may be scheduled for surgery. Ask your surgeon if you have any questions.  Please call us immediately for any of the following conditions: .Severe or worsening pain your legs or feet at rest or with walking. .Increased pain, redness, drainage at  your groin puncture site. .Fever of 101 degrees or higher. .If you have any mild or slow bleeding from your puncture site: lie down, apply firm constant pressure over the area with a piece of gauze or a clean wash cloth for 30 minutes- no peeking!, call 911 right away if you are still bleeding after 30 minutes, or if the bleeding is heavy and unmanageable.  Reduce your risk factors of vascular disease:  Stop smoking. If you would like help call [facility] at 1-800-QUIT-NOW (765) 322-7247) or [place] at [phone]. Manage your cholesterol Maintain a desired weight Control your diabetes Keep your blood pressure down  If you have any questions, please call the office at [phone]

## 2020-06-16 NOTE — H&P (Signed)
History and Physical Interval Note:  06/16/2020 12:05 PM  Brenda Carney  has presented today for surgery, with the diagnosis of pad.  The various methods of treatment have been discussed with the patient and family. After consideration of risks, benefits and other options for treatment, the patient has consented to  Procedure(s): LOWER EXTREMITY ANGIOGRAPHY (N/A) as a surgical intervention.  The patient's history has been reviewed, patient examined, no change in status, stable for surgery.  I have reviewed the patient's chart and labs.  Questions were answered to the patient's satisfaction.    Lower extremity arteriogram - previous right leg bypass now with short distance claudication and ABi 0.55  Brenda Carney  Patient name: Brenda Carney        MRN: [id number]        DOB: 1951-11-05          Sex: female  REASON FOR CONSULT: Evaluate right lower extremity calf pain in setting of previous bypass  HPI: Brenda Carney is a 69 y.o. female, with history of hypertension, hyperlipidemia, coronary disease, COPD, PAD that presents for evaluation of right lower extremity calf pain.  Patient states she has had pain in her right calf for the last several months.  States whenever she walks she gets burning in the right calf that starts immediately as she walks.  No issues with the left leg.  She is well-known to vascular surgery and previously had a right popliteal artery occlusion and had a right above-knee to below-knee popliteal bypass with ipsilateral nonreversed saphenous vein on 05/15/2017 by Dr. Bridgett Larsson.  This included vein patch of the below-knee popliteal artery and this was for a wound at the time.  Patient has not followed up since 2018.  Apparently stopped smoking about a year ago.  Is taking an aspirin daily.  States she cannot tolerate the symptoms in her right calf and very limiting for quality of life.      Past Medical History:  Diagnosis Date  . Arthritis    "thumbs; hips"  (05/14/2017)  . 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 Cerebrovascular disease    Carotid dopplers which showed no significant increase in velocities, extremely minimal on the left, antegrade vertebral bilaterally.  . Cervical spondylosis   . Chronic lower back pain   . COPD (chronic obstructive pulmonary disease) ([place])    "little bit" (05/14/2017)  . Coronary artery disease, non-occlusive    Coronary calcium scoring: June 2018: Score 1106. 98 percentile for age -[place]  . Depression   . Exercise-induced asthma with acute exacerbation    "only in very hot weather" (05/14/2017)  . GERD (gastroesophageal reflux disease)   . History of IBS    resovlved  . 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 Hyperlipidemia with target LDL less than 70    Mostly statin intolerant,. Currently on Livalo 4 mg  . Hypertension, benign   . Lumbosacral spondylosis   . PAD (peripheral artery disease) (HCC)    Status post right above-the-knee-below the knee popliteal artery bypass; postop right ABI 0.96.  Marland Kitchen Pneumonia 2020  . Sleep apnea    does not wear CPAP; "think it was medication related" (05/14/2017)  . Stroke [facility]) 2010   TIA  . Thoracic aortic ectasia (HCC)    ~40 mm on Coronary Calcium Score CT - recommend f/u CTA or MRA  .  TIA (transient ischemic attack) 09/2011   "several"         Past Surgical History:  Procedure Laterality Date  . ABDOMINAL AORTOGRAM W/LOWER EXTREMITY N/A 05/14/2017   Procedure: Abdominal Aortogram w/Lower Extremity;  Surgeon: Conrad Charenton, MD;  Location: [place] [facility];  Service: Cardiovascular;  Laterality: N/A;  . ACROMIO-CLAVICULAR JOINT REPAIR Left 1990s   "I think it was called an AC joint removal"  . BACK SURGERY    . BYPASS GRAFT POPLITEAL TO POPLITEAL Right 05/15/2017   Procedure: BYPASS GRAFT ABOVE KNEE POPLITEAL TO BELOW  KNEE POPLITEAL ARTERY;  Surgeon: Conrad , MD;  Location: [facility];  Service: Vascular;  Laterality: Right;  . ESOPHAGOGASTRODUODENOSCOPY (EGD) WITH PROPOFOL N/A 04/29/2018   Procedure: ESOPHAGOGASTRODUODENOSCOPY (EGD) WITH PROPOFOL;  Surgeon: Otis Brace, MD;  Location: [place];  Service: Gastroenterology;  Laterality: N/A;  . FOOT FRACTURE SURGERY Bilateral    multiple procedures  . FRACTURE SURGERY    . JOINT REPLACEMENT    . MAXIMUM ACCESS (MAS)POSTERIOR LUMBAR INTERBODY FUSION (PLIF) 1 LEVEL N/A 09/30/2014   Procedure: FOR MAXIMUM ACCESS (MAS) POSTERIOR LUMBAR INTERBODY FUSION (PLIF) 1 LEVEL;  Surgeon: Eustace Moore, MD;  Location: [place] [facility];  Service: Neurosurgery;  Laterality: N/A;  FOR MAXIMUM ACCESS (MAS) POSTERIOR LUMBAR INTERBODY FUSION (PLIF) 1 LEVEL LUMBAR 4-5  . MULTIPLE TOOTH EXTRACTIONS Right 04/2017  . NM MYOVIEW LTD  06/2017    Normal EF, 66%. No ST changes. Normal study. LOW RISK.  Marland Kitchen TOTAL HIP ARTHROPLASTY Right 01/16/2020   Procedure: RIGHT TOTAL HIP ARTHROPLASTY ANTERIOR APPROACH;  Surgeon: Mcarthur Rossetti, MD;  Location: [facility];  Service: Orthopedics;  Laterality: Right;  . TOTAL HIP ARTHROPLASTY Left 04/30/2020   Procedure: LEFT TOTAL HIP ARTHROPLASTY ANTERIOR APPROACH;  Surgeon: Mcarthur Rossetti, MD;  Location: [facility];  Service: Orthopedics;  Laterality: Left;  . TRANSTHORACIC ECHOCARDIOGRAM  09/07/2013   Done to evaluate TIA: Normal LV size and function. EF 55-60%. GR 1 DD. Mild left atrial dilation. Mildly calcified mitral leaflets. Otherwise normal.         Family History  Problem Relation Age of Onset  . Stroke Father   . Heart attack Mother   . Stroke Mother     SOCIAL HISTORY: Social History        Socioeconomic History  . Marital status: Widowed    Spouse name: [name]   . Number of children: 0  . Years of education: COLLEGE  . Highest education level: Not on file  Occupational History  .  Occupation: Horse Breeder   Tobacco Use  . Smoking status: Heavy Tobacco Smoker    Packs/day: 0.02    Years: 36.00    Pack years: 0.72    Types: Cigarettes    Last attempt to quit: 04/24/2017    Years since quitting: 3.1  . Smokeless tobacco: Never Used  Vaping Use  . Vaping Use: Former  Substance and Sexual Activity  . Alcohol use: Yes    Alcohol/week: 3.0 standard drinks    Types: 3 Glasses of wine per week  . Drug use: No  . Sexual activity: Never  Other Topics Concern  . Not on file  Social History Narrative   Patient lives at home with her husband [name]. Patient has no children.    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    Patient has BA degree.    Smokes 2-3 cigarettes /day -- former heavy smoker (  failed "quitting") -- now says she has truly "QUIT" - however now is been going back and forth between quitting and cheating and probably smokes anywhere from 0-3 cigarettes a day.   Social Determinants of Health      Financial Resource Strain:   . Difficulty of Paying Living Expenses:   Food Insecurity:   . Worried About Charity fundraiser in the Last Year:   . Arboriculturist in the Last Year:   Transportation Needs:   . Film/video editor (Medical):   Marland Kitchen Lack of Transportation (Non-Medical):   Physical Activity:   . Days of Exercise per Week:   . Minutes of Exercise per Session:   Stress:   . Feeling of Stress :   Social Connections:   . Frequency of Communication with Friends and Family:   . Frequency of Social Gatherings with Friends and Family:   . Attends Religious Services:   . Active Member of Clubs or Organizations:   . Attends Archivist Meetings:   Marland Kitchen Marital Status:   Intimate Partner Violence:   . Fear of Current or Ex-Partner:   . Emotionally Abused:   Marland Kitchen Physically Abused:   . Sexually Abused:          Allergies  Allergen Reactions  . Penicillins Swelling and Other (See Comments)     TONGUE SWELLING  PATIENT HAD A PCN REACTION WITH IMMEDIATE RASH, FACIAL/TONGUE/THROAT SWELLING, SOB, OR LIGHTHEADEDNESS WITH HYPOTENSION:  #  #  #  YES  #  #  #   Has patient had a PCN reaction causing severe rash involving mucus membranes or skin necrosis: No Has patient had a PCN reaction that required hospitalization: No Has patient had a PCN reaction occurring within the last 10 years: No   . Statins     muscle pain  . Lyrica [Pregabalin] Other (See Comments)    DRY MOUTH          Current Outpatient Medications  Medication Sig Dispense Refill  . albuterol (PROVENTIL) (2.5 MG/3ML) 0.083% nebulizer solution Take 3 mLs (2.5 mg total) by nebulization 2 (two) times daily as needed for wheezing or shortness of breath. 75 mL 0  . albuterol (VENTOLIN HFA) 108 (90 Base) MCG/ACT inhaler Inhale 2 puffs into the lungs every 4 (four) hours as needed for wheezing or shortness of breath.    Marland Kitchen amLODipine (NORVASC) 10 MG tablet Take 1 tablet (10 mg total) by mouth daily. 30 tablet 0  . ANORO ELLIPTA 62.5-25 MCG/INH AEPB Inhale 2 puffs into the lungs daily as needed (wheezing).     . ARIPiprazole (ABILIFY) 2 MG tablet Take 2 mg by mouth daily.    . Ascorbic Acid (VITAMIN C) 1000 MG tablet Take 1,000 mg by mouth daily.    Marland Kitchen aspirin 81 MG chewable tablet Chew 1 tablet (81 mg total) by mouth 2 (two) times daily. 30 tablet 0  . b complex vitamins tablet Take 1 tablet by mouth daily.    . Biotin w/ Vitamins C & E (HAIR/SKIN/NAILS PO) Take 1 tablet by mouth daily. Viviscal    . buPROPion (WELLBUTRIN XL) 150 MG 24 hr tablet Take 150 mg by mouth daily.  1  . Calcium Carb-Cholecalciferol (CALTRATE 600+D3 PO) Take 1 tablet by mouth daily before breakfast.    . desvenlafaxine (PRISTIQ) 100 MG 24 hr tablet Take 100 mg by mouth daily.    . Estradiol 10 MCG TABS vaginal tablet Place 10 mcg vaginally 2 (  two) times a week.    . Evolocumab (REPATHA SURECLICK) 856 MG/ML SOAJ Inject 140 mg  into the skin every 14 (fourteen) days. 2 mL 11  . lisinopril (ZESTRIL) 10 MG tablet Take 10 mg by mouth daily.    . methocarbamol (ROBAXIN) 500 MG tablet Take 1 tablet (500 mg total) by mouth every 6 (six) hours as needed for muscle spasms. 40 tablet 1  . Multiple Vitamin (MULTIVITAMIN WITH MINERALS) TABS tablet Take 1 tablet by mouth daily. One-A-Day [place]    . oxyCODONE (OXY IR/ROXICODONE) 5 MG immediate release tablet Take 1-2 tablets (5-10 mg total) by mouth every 6 (six) hours as needed for moderate pain (pain score 4-6). 30 tablet 0  . prazosin (MINIPRESS) 1 MG capsule Take 1 mg by mouth at bedtime.    Marland Kitchen Propylene Glycol-Glycerin (SOOTHE OP) Place 1 drop into both eyes 3 (three) times daily as needed (for dry eyes).     . traZODone (DESYREL) 50 MG tablet Take 50 mg by mouth at bedtime.    . Vitamin D3 (VITAMIN D) 25 MCG tablet Take 1 Units by mouth daily.    Marland Kitchen zolpidem (AMBIEN) 10 MG tablet Take 10 mg by mouth at bedtime. Take 15 mg by mouth at bedtime and an additional 5 mg if still unable to sleep after an hour      No current facility-administered medications for this visit.    REVIEW OF SYSTEMS:  [X]  denotes positive finding, [ ]  denotes negative finding Cardiac  Comments:  Chest pain or chest pressure:    Shortness of breath upon exertion:    Short of breath when lying flat:    Irregular heart rhythm:        Vascular    Pain in calf, thigh, or hip brought on by ambulation: x Right  Pain in feet at night that wakes you up from your sleep:     Blood clot in your veins:    Leg swelling:         Pulmonary    Oxygen at home:    Productive cough:     Wheezing:         Neurologic    Sudden weakness in arms or legs:     Sudden numbness in arms or legs:     Sudden onset of difficulty speaking or slurred speech:    Temporary loss of vision in one eye:     Problems with dizziness:         Gastrointestinal     Blood in stool:     Vomited blood:         Genitourinary    Burning when urinating:     Blood in urine:        Psychiatric    Major depression:         Hematologic    Bleeding problems:    Problems with blood clotting too easily:        Skin    Rashes or ulcers:        Constitutional    Fever or chills:      PHYSICAL EXAM:    Vitals:   06/15/20 1045  BP: (!) 143/95  Pulse: 84  Resp: 16  Temp: 97.7 F (36.5 C)  TempSrc: Temporal  SpO2: 96%  Weight: 138 lb (62.6 kg)  Height: 5\' 5"  (1.651 m)    GENERAL: The patient is a well-nourished female, in no acute distress. The vital signs are documented above. CARDIAC:  There is a regular rate and rhythm.  VASCULAR:  Palpable femoral pulses both groins Right DP and PT nonpalpable and no tissue loss Left DP palpable Right leg incisions well healed from previous bypass PULMONARY: There is good air exchange bilaterally without wheezing or rales. ABDOMEN: Soft and non-tender with normal pitched bowel sounds.  MUSCULOSKELETAL: There are no major deformities or cyanosis. NEUROLOGIC: No focal weakness or paresthesias are detected. SKIN: There are no ulcers or rashes noted. PSYCHIATRIC: The patient has a normal affect.  DATA:   ABIs today are 0.55 on the right monophasic with a toe pressure of 0 and 1.08 on the left triphasic  Assessment/Plan:  69 year old female presents for evaluation of short distance lifestyle limiting claudication in the right lower extremity where she previously had a right above knee to below-knee popliteal artery bypass with vein in 2018 by Dr. Bridgett Larsson for popliteal artery occlusion with tissue loss.  Discussed with the patient given monophasic waveform in the right ankle with a toe pressure of 0, I suspect her right lower extremity bypass is occluded.  I have recommended right lower extremity arteriogram and likely will require a redo bypass in the  right lower extremity pending identification of targets.  Will also evaluate for other endovascular options.  Will likely need to get updated vein mapping after arteriogram.  She would like to proceed tomorrow.  Risks and benefits discussed in detail.     Brenda Heck, MD [facility] Office: [phone]

## 2020-06-17 ENCOUNTER — Encounter (HOSPITAL_COMMUNITY): Payer: Self-pay | Admitting: Vascular Surgery

## 2020-06-22 DIAGNOSIS — G459 Transient cerebral ischemic attack, unspecified: Secondary | ICD-10-CM | POA: Diagnosis not present

## 2020-06-22 DIAGNOSIS — J449 Chronic obstructive pulmonary disease, unspecified: Secondary | ICD-10-CM | POA: Diagnosis not present

## 2020-06-22 DIAGNOSIS — J441 Chronic obstructive pulmonary disease with (acute) exacerbation: Secondary | ICD-10-CM | POA: Diagnosis not present

## 2020-06-22 DIAGNOSIS — E782 Mixed hyperlipidemia: Secondary | ICD-10-CM | POA: Diagnosis not present

## 2020-06-22 DIAGNOSIS — I10 Essential (primary) hypertension: Secondary | ICD-10-CM | POA: Diagnosis not present

## 2020-06-22 DIAGNOSIS — F329 Major depressive disorder, single episode, unspecified: Secondary | ICD-10-CM | POA: Diagnosis not present

## 2020-06-22 DIAGNOSIS — D500 Iron deficiency anemia secondary to blood loss (chronic): Secondary | ICD-10-CM | POA: Diagnosis not present

## 2020-07-01 ENCOUNTER — Other Ambulatory Visit (HOSPITAL_COMMUNITY): Payer: Self-pay | Admitting: Internal Medicine

## 2020-07-01 ENCOUNTER — Other Ambulatory Visit: Payer: Self-pay | Admitting: Internal Medicine

## 2020-07-01 DIAGNOSIS — R1012 Left upper quadrant pain: Secondary | ICD-10-CM

## 2020-07-01 DIAGNOSIS — R0989 Other specified symptoms and signs involving the circulatory and respiratory systems: Secondary | ICD-10-CM

## 2020-07-02 ENCOUNTER — Encounter (HOSPITAL_COMMUNITY): Payer: Self-pay

## 2020-07-02 ENCOUNTER — Ambulatory Visit (HOSPITAL_COMMUNITY)
Admission: RE | Admit: 2020-07-02 | Discharge: 2020-07-02 | Disposition: A | Discharge status: Home / Self Care | Payer: Medicare Other | Source: Ambulatory Visit | Attending: Internal Medicine | Admitting: Internal Medicine

## 2020-07-02 ENCOUNTER — Other Ambulatory Visit: Payer: Self-pay

## 2020-07-02 DIAGNOSIS — R1012 Left upper quadrant pain: Secondary | ICD-10-CM | POA: Diagnosis not present

## 2020-07-02 DIAGNOSIS — R0989 Other specified symptoms and signs involving the circulatory and respiratory systems: Secondary | ICD-10-CM | POA: Diagnosis present

## 2020-07-02 MED ORDER — IOHEXOL 350 MG/ML SOLN
100.0000 mL | Freq: Once | INTRAVENOUS | Status: AC | PRN
  Administered 2020-07-02: 100 mL via INTRAVENOUS

## 2020-07-02 MED ORDER — SODIUM CHLORIDE (PF) 0.9 % IJ SOLN
INTRAMUSCULAR | Status: AC
  Filled 2020-07-02: qty 50

## 2020-07-12 ENCOUNTER — Ambulatory Visit (INDEPENDENT_AMBULATORY_CARE_PROVIDER_SITE_OTHER): Payer: Medicare Other | Admitting: Orthopaedic Surgery

## 2020-07-12 ENCOUNTER — Other Ambulatory Visit: Payer: Self-pay

## 2020-07-12 ENCOUNTER — Encounter: Payer: Self-pay | Admitting: Orthopaedic Surgery

## 2020-07-12 VITALS — Ht 65.0 in | Wt 136.0 lb

## 2020-07-12 DIAGNOSIS — Z96641 Presence of right artificial hip joint: Secondary | ICD-10-CM

## 2020-07-12 DIAGNOSIS — Z96642 Presence of left artificial hip joint: Secondary | ICD-10-CM

## 2020-07-12 NOTE — Progress Notes (Signed)
The patient is 10 weeks status post a left total hip arthroplasty in several months status post a right hip being replaced.  She is 43 and very active.  She is walking without an assistive device.  She is doing well and has no complaints.  On exam she walks with a limp.  Her leg lengths are equal.  She tolerates me easily putting both hips through internal and external rotation.  This point we do not need to see her back until 6 months from now.  At that visit I like a standing low AP pelvis and lateral of both hips.  If she has any issues before then she will let us know.  All questions and concerns were answered and addressed.

## 2020-07-14 DIAGNOSIS — M533 Sacrococcygeal disorders, not elsewhere classified: Secondary | ICD-10-CM | POA: Diagnosis not present

## 2020-07-14 DIAGNOSIS — M87059 Idiopathic aseptic necrosis of unspecified femur: Secondary | ICD-10-CM | POA: Diagnosis not present

## 2020-07-14 DIAGNOSIS — G894 Chronic pain syndrome: Secondary | ICD-10-CM | POA: Diagnosis not present

## 2020-07-14 DIAGNOSIS — M5137 Other intervertebral disc degeneration, lumbosacral region: Secondary | ICD-10-CM | POA: Diagnosis not present

## 2020-07-20 NOTE — Progress Notes (Signed)
April 24, 2019-scanned labs Na+ 137, K+ 3.3 (L), Cl- 105, HCO3-24, BUN 7, Cr 0.7, Glu 105, Ca2+ 9.1; AST 20, ALT 12, AlkP 80  TC 154, TG 143, HDL 57, LDL 68   Labs compared to August 2020-LDL is down from 84-68, TC down from 205 down to 154. -->  Repatha seems to be working very well.  Otherwise besides potassium being little low, labs look pretty good.  Glucose a little high but normal kidney and liver function.  Continue current meds.   Glenetta Hew, MD

## 2020-07-22 ENCOUNTER — Other Ambulatory Visit: Payer: Self-pay

## 2020-07-22 DIAGNOSIS — I739 Peripheral vascular disease, unspecified: Secondary | ICD-10-CM

## 2020-08-02 DIAGNOSIS — E782 Mixed hyperlipidemia: Secondary | ICD-10-CM | POA: Diagnosis not present

## 2020-08-02 DIAGNOSIS — I10 Essential (primary) hypertension: Secondary | ICD-10-CM | POA: Diagnosis not present

## 2020-08-02 DIAGNOSIS — J449 Chronic obstructive pulmonary disease, unspecified: Secondary | ICD-10-CM | POA: Diagnosis not present

## 2020-08-02 DIAGNOSIS — J441 Chronic obstructive pulmonary disease with (acute) exacerbation: Secondary | ICD-10-CM | POA: Diagnosis not present

## 2020-08-02 DIAGNOSIS — G459 Transient cerebral ischemic attack, unspecified: Secondary | ICD-10-CM | POA: Diagnosis not present

## 2020-08-02 DIAGNOSIS — D500 Iron deficiency anemia secondary to blood loss (chronic): Secondary | ICD-10-CM | POA: Diagnosis not present

## 2020-08-02 DIAGNOSIS — F329 Major depressive disorder, single episode, unspecified: Secondary | ICD-10-CM | POA: Diagnosis not present

## 2020-08-03 ENCOUNTER — Ambulatory Visit (HOSPITAL_COMMUNITY)
Admission: RE | Admit: 2020-08-03 | Discharge: 2020-08-03 | Disposition: A | Discharge status: Home / Self Care | Payer: Medicare Other | Source: Ambulatory Visit | Attending: Vascular Surgery | Admitting: Vascular Surgery

## 2020-08-03 ENCOUNTER — Ambulatory Visit (INDEPENDENT_AMBULATORY_CARE_PROVIDER_SITE_OTHER)
Admission: RE | Admit: 2020-08-03 | Discharge: 2020-08-03 | Disposition: A | Discharge status: Home / Self Care | Payer: Medicare Other | Source: Ambulatory Visit | Attending: Vascular Surgery | Admitting: Vascular Surgery

## 2020-08-03 ENCOUNTER — Other Ambulatory Visit: Payer: Self-pay

## 2020-08-03 ENCOUNTER — Encounter: Payer: Self-pay | Admitting: Vascular Surgery

## 2020-08-03 ENCOUNTER — Ambulatory Visit (INDEPENDENT_AMBULATORY_CARE_PROVIDER_SITE_OTHER): Payer: Medicare Other | Admitting: Vascular Surgery

## 2020-08-03 VITALS — BP 135/88 | HR 78 | Temp 97.6°F | Resp 20 | Ht 65.0 in | Wt 139.0 lb

## 2020-08-03 DIAGNOSIS — I739 Peripheral vascular disease, unspecified: Secondary | ICD-10-CM

## 2020-08-03 NOTE — Progress Notes (Signed)
Patient name: Brenda Carney MRN: [id number] DOB: Apr 22, 1951 Sex: female  REASON FOR VISIT: Postop check after right SFA laser atherectomy and balloon angioplasty for SFA occlusion in the setting of previous right above-knee to below-knee popliteal artery bypass  HPI: Brenda Carney is a 69 y.o. female with multiple medical problems as noted below who presents for postop check.  She recently underwent right SFA laser atherectomy with balloon angioplasty including drug-coated balloon on 06/16/2020.  This is for recurrent claudication in the setting of previous right above-knee to below-knee pop bypass with vein by Dr. Bridgett Larsson in 2018.  On arteriogram she was found to have an occluded SFA proximal to the bypass and the bypass remained patent.  Ultimately on follow-up today she states her right leg symptoms have completely resolved.  She remains on aspirin and Plavix.  Past Medical History:  Diagnosis Date  . Arthritis    "thumbs; hips" (05/14/2017)  . 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 Cerebrovascular disease    Carotid dopplers which showed no significant increase in velocities, extremely minimal on the left, antegrade vertebral bilaterally.  . Cervical spondylosis   . Chronic lower back pain   . COPD (chronic obstructive pulmonary disease) ([place])    "little bit" (05/14/2017)  . Coronary artery disease, non-occlusive    Coronary calcium scoring: June 2018: Score 1106. 98 percentile for age -[place]  . Depression   . Exercise-induced asthma with acute exacerbation    "only in very hot weather" (05/14/2017)  . GERD (gastroesophageal reflux disease)   . History of IBS    resovlved  . 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 Hyperlipidemia with target LDL less than 70    Mostly statin intolerant,. Currently on Livalo 4 mg  . Hypertension, benign   . Lumbosacral spondylosis   . PAD  (peripheral artery disease) (HCC)    Status post right above-the-knee-below the knee popliteal artery bypass; postop right ABI 0.96.  Marland Kitchen Pneumonia 2020  . Sleep apnea    does not wear CPAP; "think it was medication related" (05/14/2017)  . Stroke [facility]) 2010   TIA  . Thoracic aortic ectasia (HCC)    ~40 mm on Coronary Calcium Score CT - recommend f/u CTA or MRA  . TIA (transient ischemic attack) 09/2011   "several"    Past Surgical History:  Procedure Laterality Date  . ABDOMINAL AORTOGRAM W/LOWER EXTREMITY N/A 05/14/2017   Procedure: Abdominal Aortogram w/Lower Extremity;  Surgeon: Conrad Gassaway, MD;  Location: [facility];  Service: Cardiovascular;  Laterality: N/A;  . ACROMIO-CLAVICULAR JOINT REPAIR Left 1990s   "I think it was called an AC joint removal"  . BACK SURGERY    . BYPASS GRAFT POPLITEAL TO POPLITEAL Right 05/15/2017   Procedure: BYPASS GRAFT ABOVE KNEE POPLITEAL TO BELOW KNEE POPLITEAL ARTERY;  Surgeon: Conrad Southport, MD;  Location: [facility];  Service: Vascular;  Laterality: Right;  . ESOPHAGOGASTRODUODENOSCOPY (EGD) WITH PROPOFOL N/A 04/29/2018   Procedure: ESOPHAGOGASTRODUODENOSCOPY (EGD) WITH PROPOFOL;  Surgeon: Otis Brace, MD;  Location: [facility];  Service: Gastroenterology;  Laterality: N/A;  . FOOT FRACTURE SURGERY Bilateral    multiple procedures  . FRACTURE SURGERY    . JOINT REPLACEMENT    . LOWER EXTREMITY ANGIOGRAPHY N/A 06/16/2020   Procedure: LOWER EXTREMITY ANGIOGRAPHY;  Surgeon: [name], MD;  Location: [place] [facility];  Service: Cardiovascular;  Laterality: N/A;  . MAXIMUM ACCESS (MAS)POSTERIOR LUMBAR INTERBODY FUSION (PLIF) 1 LEVEL N/A 09/30/2014   Procedure: FOR MAXIMUM ACCESS (MAS) POSTERIOR LUMBAR INTERBODY FUSION (PLIF) 1 LEVEL;  Surgeon: Eustace Moore, MD;  Location: [place] [facility];  Service: Neurosurgery;  Laterality: N/A;  FOR MAXIMUM ACCESS (MAS) POSTERIOR LUMBAR INTERBODY FUSION (PLIF) 1 LEVEL LUMBAR 4-5  . MULTIPLE TOOTH  EXTRACTIONS Right 04/2017  . NM MYOVIEW LTD  06/2017    Normal EF, 66%. No ST changes. Normal study. LOW RISK.  Marland Kitchen PERIPHERAL VASCULAR ATHERECTOMY Left 06/16/2020   Procedure: PERIPHERAL VASCULAR ATHERECTOMY;  Surgeon: [name], MD;  Location: [facility];  Service: Cardiovascular;  Laterality: Left;  SFA  . TOTAL HIP ARTHROPLASTY Right 01/16/2020   Procedure: RIGHT TOTAL HIP ARTHROPLASTY ANTERIOR APPROACH;  Surgeon: Mcarthur Rossetti, MD;  Location: [facility];  Service: Orthopedics;  Laterality: Right;  . TOTAL HIP ARTHROPLASTY Left 04/30/2020   Procedure: LEFT TOTAL HIP ARTHROPLASTY ANTERIOR APPROACH;  Surgeon: Mcarthur Rossetti, MD;  Location: [facility];  Service: Orthopedics;  Laterality: Left;  . TRANSTHORACIC ECHOCARDIOGRAM  09/07/2013   Done to evaluate TIA: Normal LV size and function. EF 55-60%. GR 1 DD. Mild left atrial dilation. Mildly calcified mitral leaflets. Otherwise normal.    Family History  Problem Relation Age of Onset  . Stroke Father   . Heart attack Mother   . Stroke Mother     SOCIAL HISTORY: Social History   Tobacco Use  . Smoking status: Heavy Tobacco Smoker    Packs/day: 0.02    Years: 36.00    Pack years: 0.72    Types: Cigarettes    Last attempt to quit: 04/24/2017    Years since quitting: 3.2  . Smokeless tobacco: Never Used  Substance Use Topics  . Alcohol use: Yes    Alcohol/week: 3.0 standard drinks    Types: 3 Glasses of wine per week    Allergies  Allergen Reactions  . Penicillins Swelling and Other (See Comments)    TONGUE SWELLING  PATIENT HAD A PCN REACTION WITH IMMEDIATE RASH, FACIAL/TONGUE/THROAT SWELLING, SOB, OR LIGHTHEADEDNESS WITH HYPOTENSION:  #  #  #  YES  #  #  #   Has patient had a PCN reaction causing severe rash involving mucus membranes or skin necrosis: No Has patient had a PCN reaction that required hospitalization: No Has patient had a PCN reaction occurring within the last 10 years: No   . Statins      muscle pain  . Lyrica [Pregabalin] Other (See Comments)    DRY MOUTH    Current Outpatient Medications  Medication Sig Dispense Refill  . albuterol (VENTOLIN HFA) 108 (90 Base) MCG/ACT inhaler Inhale 2 puffs into the lungs every 4 (four) hours as needed for wheezing or shortness of breath.    Marland Kitchen amLODipine (NORVASC) 10 MG tablet Take 1 tablet (10 mg total) by mouth daily. 30 tablet 0  . ANORO ELLIPTA 62.5-25 MCG/INH AEPB Inhale 2 puffs into the lungs daily as needed (wheezing).     . ARIPiprazole (ABILIFY) 2 MG tablet Take 2 mg by mouth daily.    Marland Kitchen aspirin EC 81 MG tablet Take 81 mg by mouth daily. Swallow whole.    Marland Kitchen buPROPion (WELLBUTRIN XL) 150 MG 24 hr tablet Take 150 mg by mouth daily.  1  . clopidogrel (PLAVIX) 75 MG tablet Take 1 tablet (75 mg total) by mouth daily. 30 tablet 11  . desvenlafaxine (PRISTIQ)  100 MG 24 hr tablet Take 100 mg by mouth daily.    . Estradiol 10 MCG TABS vaginal tablet Place 10 mcg vaginally once a week.     . Evolocumab (REPATHA SURECLICK) 557 MG/ML SOAJ Inject 140 mg into the skin every 14 (fourteen) days. 2 mL 11  . lisinopril (ZESTRIL) 10 MG tablet Take 10 mg by mouth daily.    . methocarbamol (ROBAXIN) 500 MG tablet Take 1 tablet (500 mg total) by mouth every 6 (six) hours as needed for muscle spasms. 40 tablet 1  . Multiple Vitamin (MULTIVITAMIN WITH MINERALS) TABS tablet Take 1 tablet by mouth daily. One-A-Day [place]    . prazosin (MINIPRESS) 1 MG capsule Take 1 mg by mouth at bedtime.    . traZODone (DESYREL) 50 MG tablet Take 50 mg by mouth at bedtime.    Marland Kitchen zolpidem (AMBIEN) 10 MG tablet Take 10 mg by mouth at bedtime.     Marland Kitchen oxyCODONE (OXY IR/ROXICODONE) 5 MG immediate release tablet Take 1-2 tablets (5-10 mg total) by mouth every 6 (six) hours as needed for moderate pain (pain score 4-6). (Patient not taking: Reported on 08/03/2020) 30 tablet 0   No current facility-administered medications for this visit.    REVIEW OF SYSTEMS:  [X]   denotes positive finding, [ ]  denotes negative finding Cardiac  Comments:  Chest pain or chest pressure:    Shortness of breath upon exertion:    Short of breath when lying flat:    Irregular heart rhythm:        Vascular    Pain in calf, thigh, or hip brought on by ambulation:    Pain in feet at night that wakes you up from your sleep:     Blood clot in your veins:    Leg swelling:         Pulmonary    Oxygen at home:    Productive cough:     Wheezing:         Neurologic    Sudden weakness in arms or legs:     Sudden numbness in arms or legs:     Sudden onset of difficulty speaking or slurred speech:    Temporary loss of vision in one eye:     Problems with dizziness:         Gastrointestinal    Blood in stool:     Vomited blood:         Genitourinary    Burning when urinating:     Blood in urine:        Psychiatric    Major depression:         Hematologic    Bleeding problems:    Problems with blood clotting too easily:        Skin    Rashes or ulcers:        Constitutional    Fever or chills:      PHYSICAL EXAM: Vitals:   08/03/20 1551  BP: 135/88  Pulse: 78  Resp: 20  Temp: 97.6 F (36.4 C)  SpO2: 95%  Weight: 139 lb (63 kg)  Height: 5\' 5"  (1.651 m)    GENERAL: The patient is a well-nourished female, in no acute distress. The vital signs are documented above. CARDIAC: There is a regular rate and rhythm.  VASCULAR:  Right femoral pulse palpable Left femoral pulse palpable, no hematoma Right PT palpable   DATA:   ABIs today are 1.03 on the right biphasic and 1.09  on the left triphasic  Right lower extremity arterial duplex shows a triphasic waveforms throughout the SFA and a patent bypass graft with no stenosis  Assessment/Plan:  69 year old female who recently underwent right SFA laser atherectomy with balloon angioplasty including drug-coated balloon on 06/16/2020 for SFA occlusion proximal to above-knee to below-knee popliteal artery  bypass with vein done by Dr. Bridgett Larsson in 2018.  Ultimately her claudication symptoms have now completely resolved.  She has palpable posterior tibial pulse on exam.  Her ABIs are near normal now and she has no evidence of restenosis in the right leg.  Instructed that she stay on aspirin Plavix for now.  Will arrange follow-up in 6 months with right leg arterial duplex and ABIs.   [name], MD [facility] Office: [phone]

## 2020-08-04 ENCOUNTER — Other Ambulatory Visit: Payer: Self-pay | Admitting: *Deleted

## 2020-08-04 DIAGNOSIS — I739 Peripheral vascular disease, unspecified: Secondary | ICD-10-CM

## 2020-08-05 ENCOUNTER — Telehealth: Payer: Self-pay | Admitting: *Deleted

## 2020-08-05 NOTE — Telephone Encounter (Signed)
Ortho bundle 90 day call completed. 

## 2020-08-06 ENCOUNTER — Telehealth: Payer: Self-pay | Admitting: Cardiology

## 2020-08-06 NOTE — Telephone Encounter (Signed)
New Message:    She is calling to give prior authorization on pt's Repatha. It is good from 812-21 to 08-05-21.

## 2020-08-09 NOTE — Telephone Encounter (Signed)
Documented into [id number] pharmd spreadsheet

## 2020-08-13 DIAGNOSIS — G894 Chronic pain syndrome: Secondary | ICD-10-CM | POA: Diagnosis not present

## 2020-08-13 DIAGNOSIS — M87059 Idiopathic aseptic necrosis of unspecified femur: Secondary | ICD-10-CM | POA: Diagnosis not present

## 2020-08-13 DIAGNOSIS — M533 Sacrococcygeal disorders, not elsewhere classified: Secondary | ICD-10-CM | POA: Diagnosis not present

## 2020-08-13 DIAGNOSIS — M5137 Other intervertebral disc degeneration, lumbosacral region: Secondary | ICD-10-CM | POA: Diagnosis not present

## 2020-09-10 DIAGNOSIS — M47817 Spondylosis without myelopathy or radiculopathy, lumbosacral region: Secondary | ICD-10-CM | POA: Diagnosis not present

## 2020-09-10 DIAGNOSIS — Z79899 Other long term (current) drug therapy: Secondary | ICD-10-CM | POA: Diagnosis not present

## 2020-09-10 DIAGNOSIS — G894 Chronic pain syndrome: Secondary | ICD-10-CM | POA: Diagnosis not present

## 2020-09-10 DIAGNOSIS — Z79891 Long term (current) use of opiate analgesic: Secondary | ICD-10-CM | POA: Diagnosis not present

## 2020-09-10 DIAGNOSIS — M87052 Idiopathic aseptic necrosis of left femur: Secondary | ICD-10-CM | POA: Diagnosis not present

## 2020-09-10 DIAGNOSIS — M961 Postlaminectomy syndrome, not elsewhere classified: Secondary | ICD-10-CM | POA: Diagnosis not present

## 2020-09-13 ENCOUNTER — Other Ambulatory Visit: Payer: Self-pay | Admitting: Cardiology

## 2020-09-27 DIAGNOSIS — Z23 Encounter for immunization: Secondary | ICD-10-CM | POA: Diagnosis not present

## 2020-10-05 DIAGNOSIS — E782 Mixed hyperlipidemia: Secondary | ICD-10-CM | POA: Diagnosis not present

## 2020-10-05 DIAGNOSIS — G459 Transient cerebral ischemic attack, unspecified: Secondary | ICD-10-CM | POA: Diagnosis not present

## 2020-10-05 DIAGNOSIS — I10 Essential (primary) hypertension: Secondary | ICD-10-CM | POA: Diagnosis not present

## 2020-10-05 DIAGNOSIS — J449 Chronic obstructive pulmonary disease, unspecified: Secondary | ICD-10-CM | POA: Diagnosis not present

## 2020-10-05 DIAGNOSIS — J441 Chronic obstructive pulmonary disease with (acute) exacerbation: Secondary | ICD-10-CM | POA: Diagnosis not present

## 2020-10-06 DIAGNOSIS — R82998 Other abnormal findings in urine: Secondary | ICD-10-CM | POA: Diagnosis not present

## 2020-10-12 DIAGNOSIS — I77810 Thoracic aortic ectasia: Secondary | ICD-10-CM | POA: Diagnosis not present

## 2020-10-12 DIAGNOSIS — I739 Peripheral vascular disease, unspecified: Secondary | ICD-10-CM | POA: Diagnosis not present

## 2020-10-12 DIAGNOSIS — I2510 Atherosclerotic heart disease of native coronary artery without angina pectoris: Secondary | ICD-10-CM | POA: Diagnosis not present

## 2020-10-12 DIAGNOSIS — M4692 Unspecified inflammatory spondylopathy, cervical region: Secondary | ICD-10-CM | POA: Diagnosis not present

## 2020-10-12 DIAGNOSIS — Z0000 Encounter for general adult medical examination without abnormal findings: Secondary | ICD-10-CM | POA: Diagnosis not present

## 2020-10-12 DIAGNOSIS — M542 Cervicalgia: Secondary | ICD-10-CM | POA: Diagnosis not present

## 2020-10-12 DIAGNOSIS — M8589 Other specified disorders of bone density and structure, multiple sites: Secondary | ICD-10-CM | POA: Diagnosis not present

## 2020-10-12 DIAGNOSIS — E782 Mixed hyperlipidemia: Secondary | ICD-10-CM | POA: Diagnosis not present

## 2020-10-12 DIAGNOSIS — I719 Aortic aneurysm of unspecified site, without rupture: Secondary | ICD-10-CM | POA: Diagnosis not present

## 2020-10-12 DIAGNOSIS — F331 Major depressive disorder, recurrent, moderate: Secondary | ICD-10-CM | POA: Diagnosis not present

## 2020-10-12 DIAGNOSIS — I10 Essential (primary) hypertension: Secondary | ICD-10-CM | POA: Diagnosis not present

## 2020-10-12 DIAGNOSIS — H16223 Keratoconjunctivitis sicca, not specified as Sjogren's, bilateral: Secondary | ICD-10-CM | POA: Diagnosis not present

## 2020-10-12 DIAGNOSIS — I700 Atherosclerosis of aorta: Secondary | ICD-10-CM | POA: Diagnosis not present

## 2020-10-12 DIAGNOSIS — J449 Chronic obstructive pulmonary disease, unspecified: Secondary | ICD-10-CM | POA: Diagnosis not present

## 2020-10-22 DIAGNOSIS — G039 Meningitis, unspecified: Secondary | ICD-10-CM | POA: Diagnosis not present

## 2020-10-22 DIAGNOSIS — G894 Chronic pain syndrome: Secondary | ICD-10-CM | POA: Diagnosis not present

## 2020-10-22 DIAGNOSIS — M961 Postlaminectomy syndrome, not elsewhere classified: Secondary | ICD-10-CM | POA: Diagnosis not present

## 2020-10-22 DIAGNOSIS — M542 Cervicalgia: Secondary | ICD-10-CM | POA: Diagnosis not present

## 2020-11-01 DIAGNOSIS — M5030 Other cervical disc degeneration, unspecified cervical region: Secondary | ICD-10-CM | POA: Diagnosis not present

## 2020-11-01 DIAGNOSIS — M542 Cervicalgia: Secondary | ICD-10-CM | POA: Diagnosis not present

## 2020-11-24 DIAGNOSIS — G039 Meningitis, unspecified: Secondary | ICD-10-CM | POA: Diagnosis not present

## 2020-11-24 DIAGNOSIS — M542 Cervicalgia: Secondary | ICD-10-CM | POA: Diagnosis not present

## 2020-11-24 DIAGNOSIS — G894 Chronic pain syndrome: Secondary | ICD-10-CM | POA: Diagnosis not present

## 2020-11-24 DIAGNOSIS — M47817 Spondylosis without myelopathy or radiculopathy, lumbosacral region: Secondary | ICD-10-CM | POA: Diagnosis not present

## 2020-11-30 DIAGNOSIS — M5030 Other cervical disc degeneration, unspecified cervical region: Secondary | ICD-10-CM | POA: Diagnosis not present

## 2020-12-28 DIAGNOSIS — M5137 Other intervertebral disc degeneration, lumbosacral region: Secondary | ICD-10-CM | POA: Diagnosis not present

## 2020-12-28 DIAGNOSIS — M961 Postlaminectomy syndrome, not elsewhere classified: Secondary | ICD-10-CM | POA: Diagnosis not present

## 2020-12-28 DIAGNOSIS — G894 Chronic pain syndrome: Secondary | ICD-10-CM | POA: Diagnosis not present

## 2020-12-28 DIAGNOSIS — M87059 Idiopathic aseptic necrosis of unspecified femur: Secondary | ICD-10-CM | POA: Diagnosis not present

## 2021-01-12 ENCOUNTER — Encounter: Payer: Self-pay | Admitting: Orthopaedic Surgery

## 2021-01-12 ENCOUNTER — Ambulatory Visit (INDEPENDENT_AMBULATORY_CARE_PROVIDER_SITE_OTHER): Payer: Medicare Other | Admitting: Orthopaedic Surgery

## 2021-01-12 ENCOUNTER — Ambulatory Visit (INDEPENDENT_AMBULATORY_CARE_PROVIDER_SITE_OTHER): Payer: Medicare Other

## 2021-01-12 DIAGNOSIS — Z96641 Presence of right artificial hip joint: Secondary | ICD-10-CM

## 2021-01-12 DIAGNOSIS — Z96642 Presence of left artificial hip joint: Secondary | ICD-10-CM

## 2021-01-12 NOTE — Progress Notes (Signed)
The patient is now 1 year status post a right total hip arthroplasty and 93-month status post a left total hip arthroplasty.  She is 70 years old and very active.  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  She said the left hip still hurts her some but she reports good range of motion and strength of both hips.  She walks without a limp.  She does not need an assistive device to get around.  On examination her leg lengths are equal.  She tolerates me easily putting both hips through full motion.  An AP pelvis and lateral both hips show well-seated implants with no complicating features.  There is no evidence of loosening.  At this point follow-up can be as needed.  I gave her reassurance that things should continue to improve with time.  She understands that if she has any issues with either hip she does should not hesitate to give [place] a call.  All questions and concerns were answered and addressed.

## 2021-01-14 ENCOUNTER — Telehealth: Payer: Self-pay | Admitting: *Deleted

## 2021-01-14 NOTE — Telephone Encounter (Signed)
1 year Ortho bundle call completed for Right THA on 01/14/21.

## 2021-01-25 DIAGNOSIS — M25559 Pain in unspecified hip: Secondary | ICD-10-CM | POA: Diagnosis not present

## 2021-01-25 DIAGNOSIS — M87059 Idiopathic aseptic necrosis of unspecified femur: Secondary | ICD-10-CM | POA: Diagnosis not present

## 2021-01-25 DIAGNOSIS — M961 Postlaminectomy syndrome, not elsewhere classified: Secondary | ICD-10-CM | POA: Diagnosis not present

## 2021-01-25 DIAGNOSIS — G894 Chronic pain syndrome: Secondary | ICD-10-CM | POA: Diagnosis not present

## 2021-01-27 IMAGING — CT CT CTA ABD/PEL W/CM AND/OR W/O CM
3 of 12 series · 11 of 46 positions shown, 16 images · IV contrast (OMNIPAQUE)
Comparison: 01/10/2010

CLINICAL DATA: Left upper quadrant abdominal pain, recent bright
red bloody stools

EXAM:
CT ANGIOGRAPHY ABDOMEN AND PELVIS WITH CONTRAST AND WITHOUT CONTRAST
TECHNIQUE: Multidetector CT imaging of the abdomen and pelvis was performed
using the standard protocol during bolus administration of
intravenous contrast. Multiplanar reconstructed images and MIPs were
obtained and reviewed to evaluate the vascular anatomy.
CONTRAST:  100mL OMNIPAQUE IOHEXOL 350 MG/ML SOLN

[Series 5: arterial · axial · arterial · 0.75mm/px · z∈[-481,-187]mm · 6 of 207 slices shown]
[im 30/207  soft-tissue]
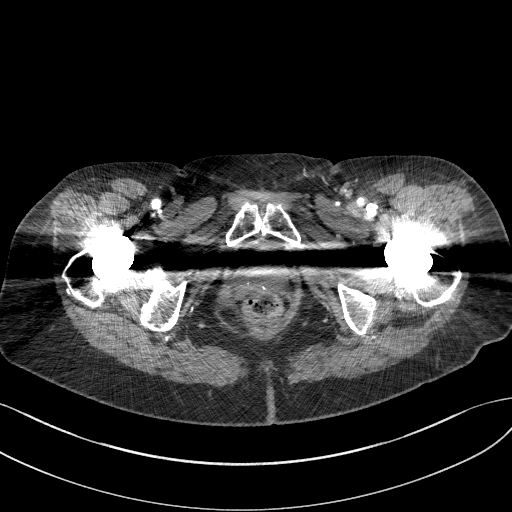
[im 59/207  soft-tissue]
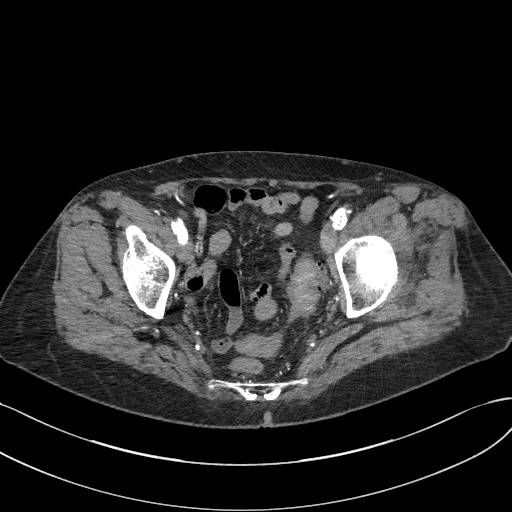
[im 89/207  soft-tissue]
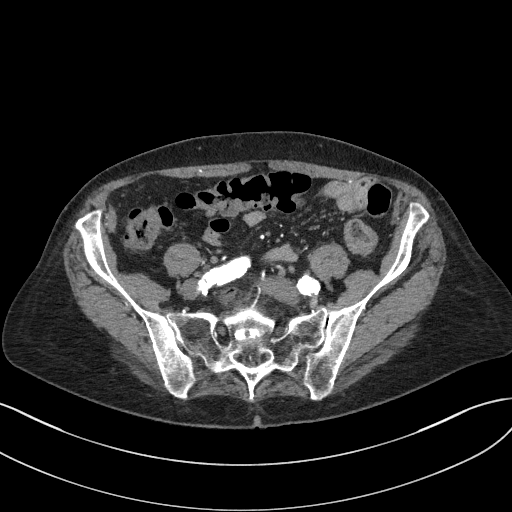
[im 118/207  soft-tissue]
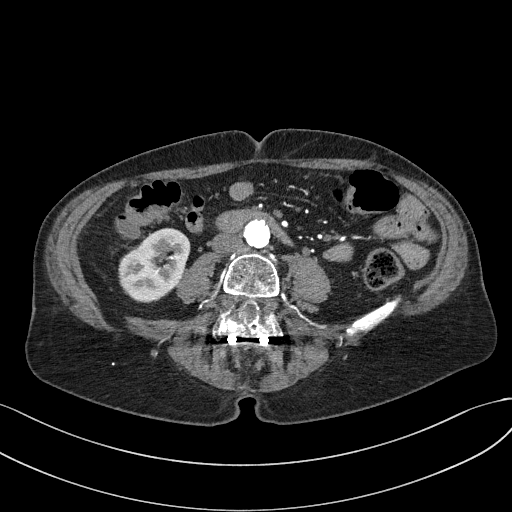
[im 148/207  soft-tissue]
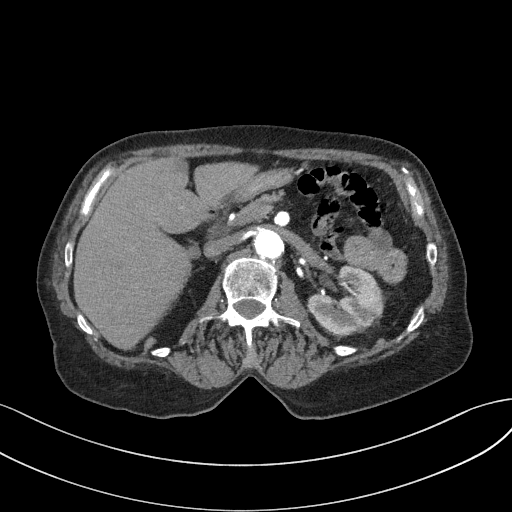
[im 177/207  soft-tissue]
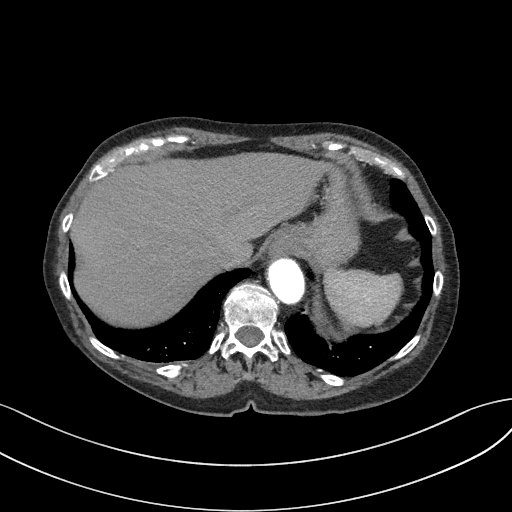

[Series 8: portal venous · axial · portal-venous · 0.75mm/px · z∈[-537,-127]mm · 3 of 83 slices shown, 7 images]
[im 1/83  soft-tissue]
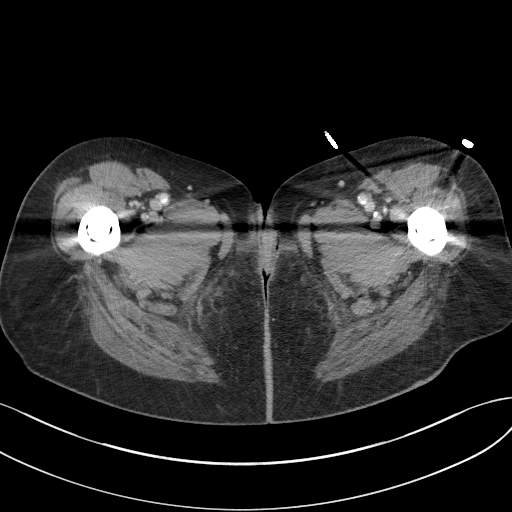
[im 1/83  lung]
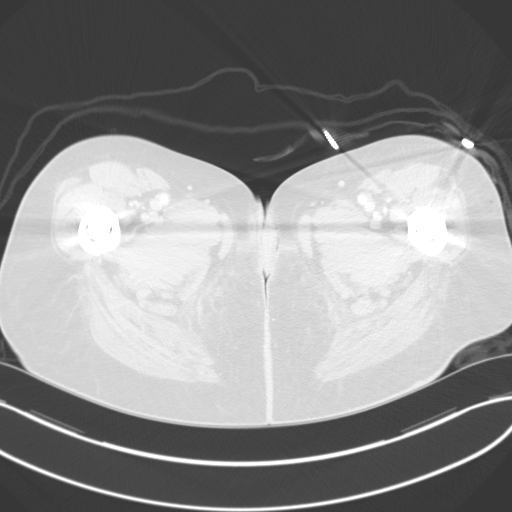
[im 1/83  bone]
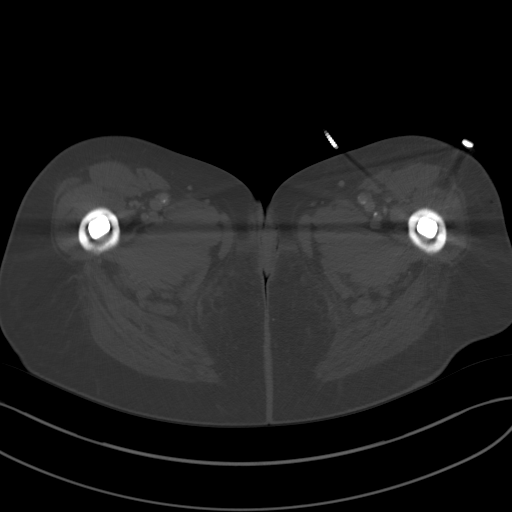
[im 42/83  soft-tissue]
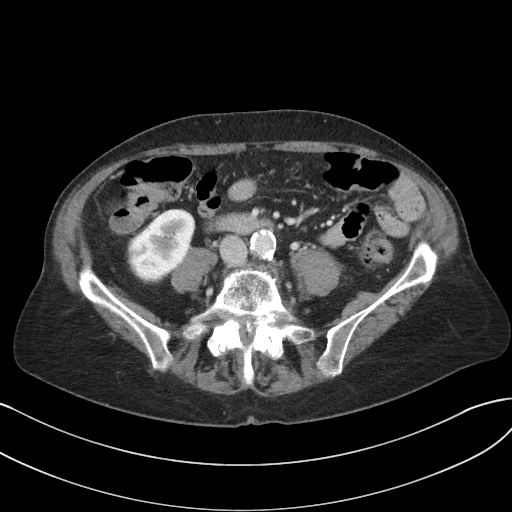
[im 42/83  lung]
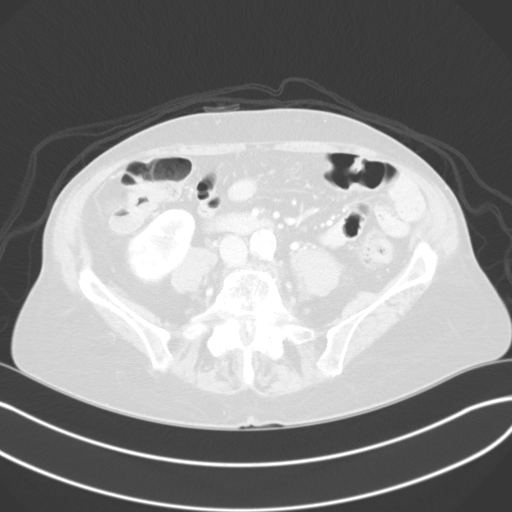
[im 83/83  soft-tissue]
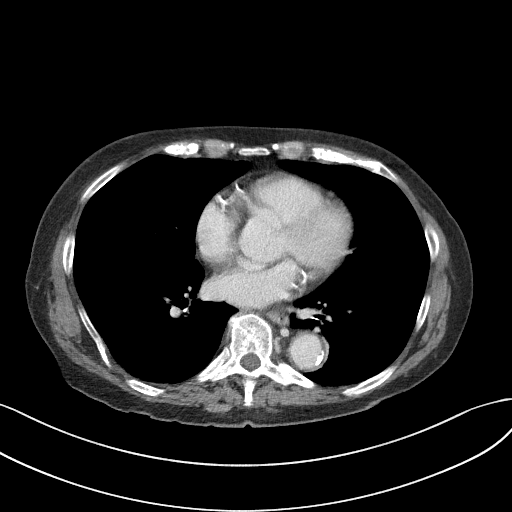
[im 83/83  lung]
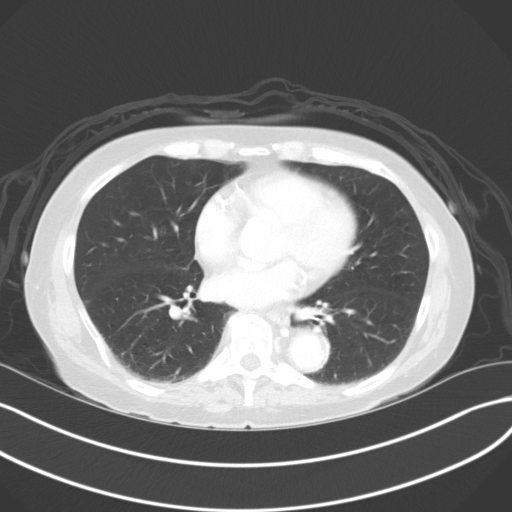

[Series 12: coronal art · coronal · 0.87mm/px · 2 of 105 slices shown, 3 images]
[im 35/105  soft-tissue]
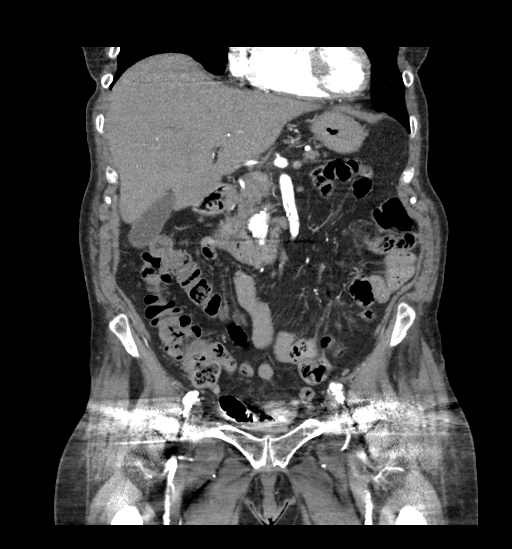
[im 35/105  bone]
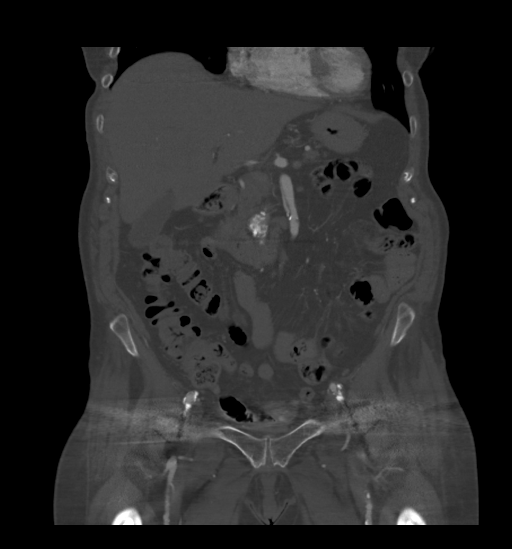
[im 70/105  soft-tissue]
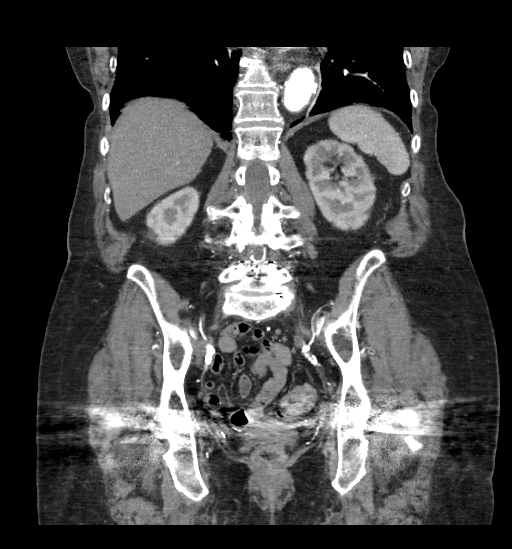

[11 of 46 positions shown; findings below may reference images not displayed]

FINDINGS: VASCULAR

Aorta: Atherosclerosis of the aorta with mild fusiform aneurysmal
dilatation of the infrarenal aorta, maximal diameter 3 cm. No acute
dissection or occlusive process.

Celiac: Widely patent origin including its branches. No active
arterial GI bleeding in the celiac territory.

SMA: Atherosclerotic origin but remains patent. Proximal SMA trunk
demonstrates a small intimal flap/dissection along the medial wall,
images 59 through 74 series 5. This has a chronic appearance. No
associated significant wall thickening or thrombus formation.
Atherosclerotic changes noted. No evidence of peripheral branch
thromboembolic process.

No active arterial bleeding in the SMA vascular territory.

Renals: Atherosclerotic origins but remain patent. Patent accessory
renal artery to the right kidney lower pole noted.

IMA: Remains patent off the distal aorta including its branches.

No active arterial GI bleeding in the IMA vascular territory.

Inflow: Heavily calcified atherosclerotic iliac vasculature with
tortuosity but no significant occlusive process or acute vascular
finding. No dissection or significant aneurysm. No significant
inflow disease by CT.

Proximal Outflow: Atherosclerotic changes of the common femoral,
proximal profunda femoral, proximal superficial femoral arteries
without visualized occlusion.

Veins: No Berubin Anto process.

Review of the MIP images confirms the above findings.

NON-VASCULAR

Lower chest: Minor basilar atelectasis dependently normal heart
size. No pericardial or pleural effusion.

Hepatobiliary: Focal fatty infiltration of the liver along the
falciform ligament anteriorly. No other significant hepatic
abnormality. Gallbladder and biliary system unremarkable. Common
bile duct nondilated.

Pancreas: Unremarkable. No pancreatic ductal dilatation or
surrounding inflammatory changes.

Spleen: Normal in size without focal abnormality.

Adrenals/Urinary Tract: Stable adrenal thickening and benign right
adrenal calcifications.

Left kidney mid pole posterior cyst measures 2.1 cm. No other
significant renal abnormality. No hydronephrosis or renal
obstruction. No hydroureter or ureteral calculus. Bladder is
collapsed.

Artifact through the lower pelvis because of the bilateral hip
replacements.

Stomach/Bowel: Negative for bowel obstruction, significant
dilatation, ileus, or free air. Normal appearing appendix in the
right hemipelvis. Sigmoid diverticulosis noted without acute
inflammatory process.

No free fluid, fluid collection, hemorrhage, hematoma, abscess or
ascites.

Lymphatic: No bulky adenopathy.

Reproductive: Pelvic anatomy obscured by hip replacement hardware.
No free fluid.

Other: Intact abdominal wall.  No hernia.

Musculoskeletal: Lumbar fusion hardware noted. Bilateral hip
arthroplasties. Lower lumbar facet arthropathy. Old posteroinferior
right rib fractures with deformity.
IMPRESSION: VASCULAR

No active arterial bleeding by CTA.

Aortoiliac atherosclerosis without occlusive process. Infrarenal
aneurysmal dilatation of the aorta maximal diameter 3 cm.

Recommend followup by US in 3 years. This recommendation follows ACR
consensus guidelines: White Paper of the ACR Incidental Findings

Chronic nonocclusive SMA main trunk dissection noted without
evidence of thromboembolic process. Mesenteric vasculature all
remains patent.

NON-VASCULAR

No other acute intra-abdominal or pelvic finding by CT.

Diverticulosis without acute inflammatory process

## 2021-02-03 DIAGNOSIS — Z1212 Encounter for screening for malignant neoplasm of rectum: Secondary | ICD-10-CM | POA: Diagnosis not present

## 2021-02-04 DIAGNOSIS — H5203 Hypermetropia, bilateral: Secondary | ICD-10-CM | POA: Diagnosis not present

## 2021-02-04 DIAGNOSIS — H524 Presbyopia: Secondary | ICD-10-CM | POA: Diagnosis not present

## 2021-02-04 DIAGNOSIS — H04123 Dry eye syndrome of bilateral lacrimal glands: Secondary | ICD-10-CM | POA: Diagnosis not present

## 2021-02-04 DIAGNOSIS — H2513 Age-related nuclear cataract, bilateral: Secondary | ICD-10-CM | POA: Diagnosis not present

## 2021-02-16 DIAGNOSIS — L570 Actinic keratosis: Secondary | ICD-10-CM | POA: Diagnosis not present

## 2021-02-16 DIAGNOSIS — L821 Other seborrheic keratosis: Secondary | ICD-10-CM | POA: Diagnosis not present

## 2021-02-16 DIAGNOSIS — D225 Melanocytic nevi of trunk: Secondary | ICD-10-CM | POA: Diagnosis not present

## 2021-02-16 DIAGNOSIS — X32XXXD Exposure to sunlight, subsequent encounter: Secondary | ICD-10-CM | POA: Diagnosis not present

## 2021-02-22 DIAGNOSIS — M87059 Idiopathic aseptic necrosis of unspecified femur: Secondary | ICD-10-CM | POA: Diagnosis not present

## 2021-02-22 DIAGNOSIS — M25559 Pain in unspecified hip: Secondary | ICD-10-CM | POA: Diagnosis not present

## 2021-02-22 DIAGNOSIS — G894 Chronic pain syndrome: Secondary | ICD-10-CM | POA: Diagnosis not present

## 2021-02-22 DIAGNOSIS — M961 Postlaminectomy syndrome, not elsewhere classified: Secondary | ICD-10-CM | POA: Diagnosis not present

## 2021-02-24 DIAGNOSIS — F341 Dysthymic disorder: Secondary | ICD-10-CM | POA: Diagnosis not present

## 2021-02-24 DIAGNOSIS — F4312 Post-traumatic stress disorder, chronic: Secondary | ICD-10-CM | POA: Diagnosis not present

## 2021-02-24 DIAGNOSIS — F39 Unspecified mood [affective] disorder: Secondary | ICD-10-CM | POA: Diagnosis not present

## 2021-03-04 DIAGNOSIS — H04123 Dry eye syndrome of bilateral lacrimal glands: Secondary | ICD-10-CM | POA: Diagnosis not present

## 2021-03-04 DIAGNOSIS — H00015 Hordeolum externum left lower eyelid: Secondary | ICD-10-CM | POA: Diagnosis not present

## 2021-03-09 DIAGNOSIS — M47812 Spondylosis without myelopathy or radiculopathy, cervical region: Secondary | ICD-10-CM | POA: Diagnosis not present

## 2021-03-22 DIAGNOSIS — G894 Chronic pain syndrome: Secondary | ICD-10-CM | POA: Diagnosis not present

## 2021-03-22 DIAGNOSIS — M5137 Other intervertebral disc degeneration, lumbosacral region: Secondary | ICD-10-CM | POA: Diagnosis not present

## 2021-03-22 DIAGNOSIS — M87059 Idiopathic aseptic necrosis of unspecified femur: Secondary | ICD-10-CM | POA: Diagnosis not present

## 2021-03-22 DIAGNOSIS — M961 Postlaminectomy syndrome, not elsewhere classified: Secondary | ICD-10-CM | POA: Diagnosis not present

## 2021-04-12 DIAGNOSIS — M791 Myalgia, unspecified site: Secondary | ICD-10-CM | POA: Diagnosis not present

## 2021-04-12 DIAGNOSIS — J449 Chronic obstructive pulmonary disease, unspecified: Secondary | ICD-10-CM | POA: Diagnosis not present

## 2021-04-12 DIAGNOSIS — R634 Abnormal weight loss: Secondary | ICD-10-CM | POA: Diagnosis not present

## 2021-04-12 DIAGNOSIS — I739 Peripheral vascular disease, unspecified: Secondary | ICD-10-CM | POA: Diagnosis not present

## 2021-04-12 DIAGNOSIS — Z87891 Personal history of nicotine dependence: Secondary | ICD-10-CM | POA: Diagnosis not present

## 2021-04-12 DIAGNOSIS — I719 Aortic aneurysm of unspecified site, without rupture: Secondary | ICD-10-CM | POA: Diagnosis not present

## 2021-04-12 DIAGNOSIS — I700 Atherosclerosis of aorta: Secondary | ICD-10-CM | POA: Diagnosis not present

## 2021-04-12 DIAGNOSIS — I10 Essential (primary) hypertension: Secondary | ICD-10-CM | POA: Diagnosis not present

## 2021-04-12 DIAGNOSIS — I2510 Atherosclerotic heart disease of native coronary artery without angina pectoris: Secondary | ICD-10-CM | POA: Diagnosis not present

## 2021-04-12 DIAGNOSIS — F331 Major depressive disorder, recurrent, moderate: Secondary | ICD-10-CM | POA: Diagnosis not present

## 2021-04-19 DIAGNOSIS — Z79891 Long term (current) use of opiate analgesic: Secondary | ICD-10-CM | POA: Diagnosis not present

## 2021-04-19 DIAGNOSIS — Z79899 Other long term (current) drug therapy: Secondary | ICD-10-CM | POA: Diagnosis not present

## 2021-04-19 DIAGNOSIS — M961 Postlaminectomy syndrome, not elsewhere classified: Secondary | ICD-10-CM | POA: Diagnosis not present

## 2021-04-19 DIAGNOSIS — G894 Chronic pain syndrome: Secondary | ICD-10-CM | POA: Diagnosis not present

## 2021-04-19 DIAGNOSIS — M5137 Other intervertebral disc degeneration, lumbosacral region: Secondary | ICD-10-CM | POA: Diagnosis not present

## 2021-04-19 DIAGNOSIS — R059 Cough, unspecified: Secondary | ICD-10-CM | POA: Diagnosis not present

## 2021-04-19 DIAGNOSIS — M87059 Idiopathic aseptic necrosis of unspecified femur: Secondary | ICD-10-CM | POA: Diagnosis not present

## 2021-04-26 DIAGNOSIS — B070 Plantar wart: Secondary | ICD-10-CM | POA: Diagnosis not present

## 2021-04-26 DIAGNOSIS — M79671 Pain in right foot: Secondary | ICD-10-CM | POA: Diagnosis not present

## 2021-05-03 ENCOUNTER — Inpatient Hospital Stay (HOSPITAL_COMMUNITY): Admission: RE | Admit: 2021-05-03 | Payer: Medicare Other | Source: Ambulatory Visit

## 2021-05-04 ENCOUNTER — Telehealth: Payer: Self-pay | Admitting: *Deleted

## 2021-05-04 NOTE — Telephone Encounter (Signed)
Ortho bundle 1 year call completed. ?

## 2021-05-10 DIAGNOSIS — Z23 Encounter for immunization: Secondary | ICD-10-CM | POA: Diagnosis not present

## 2021-05-24 DIAGNOSIS — M5137 Other intervertebral disc degeneration, lumbosacral region: Secondary | ICD-10-CM | POA: Diagnosis not present

## 2021-05-24 DIAGNOSIS — M87059 Idiopathic aseptic necrosis of unspecified femur: Secondary | ICD-10-CM | POA: Diagnosis not present

## 2021-05-24 DIAGNOSIS — G894 Chronic pain syndrome: Secondary | ICD-10-CM | POA: Diagnosis not present

## 2021-05-24 DIAGNOSIS — M961 Postlaminectomy syndrome, not elsewhere classified: Secondary | ICD-10-CM | POA: Diagnosis not present

## 2021-06-05 ENCOUNTER — Other Ambulatory Visit: Payer: Self-pay | Admitting: Vascular Surgery

## 2021-06-06 ENCOUNTER — Other Ambulatory Visit: Payer: Self-pay

## 2021-06-06 MED ORDER — CLOPIDOGREL BISULFATE 75 MG PO TABS
75.0000 mg | ORAL_TABLET | Freq: Every day | ORAL | 0 refills | Status: DC
Start: 2021-06-06 — End: 2021-08-11

## 2021-06-09 ENCOUNTER — Telehealth: Payer: Self-pay | Admitting: Hematology and Oncology

## 2021-06-09 NOTE — Telephone Encounter (Signed)
Scheduled appt per 6/15 referral. Pt aware.

## 2021-06-13 DIAGNOSIS — R634 Abnormal weight loss: Secondary | ICD-10-CM | POA: Diagnosis not present

## 2021-06-13 DIAGNOSIS — Z1211 Encounter for screening for malignant neoplasm of colon: Secondary | ICD-10-CM | POA: Diagnosis not present

## 2021-06-13 DIAGNOSIS — Z8719 Personal history of other diseases of the digestive system: Secondary | ICD-10-CM | POA: Diagnosis not present

## 2021-06-13 DIAGNOSIS — Z8679 Personal history of other diseases of the circulatory system: Secondary | ICD-10-CM | POA: Diagnosis not present

## 2021-06-13 DIAGNOSIS — K2270 Barrett's esophagus without dysplasia: Secondary | ICD-10-CM | POA: Diagnosis not present

## 2021-06-22 NOTE — Progress Notes (Signed)
[facility] CONSULT NOTE  Patient Care Team: Michael Boston, MD as PCP - General (Internal Medicine) Leonie Man, MD as PCP - Cardiology (Cardiology) Mcarthur Rossetti, MD as Consulting Physician (Orthopedic Surgery)  CHIEF COMPLAINTS/PURPOSE OF CONSULTATION:  Thrombocytopenia  HISTORY OF PRESENTING ILLNESS:  Brenda Carney 70 y.o. female is referred here by Dr. Cristie Hem, MD for evaluation and management of thrombocytopenia and recent weight loss. The patient presents to the clinic for initial evaluation.  Patient has had chronic back pain issues.  She was also suffering from upper respiratory infection when she had the blood work showing elevated platelet count.  I reviewed her records extensively and collaborated the history with the patient.   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   Cervical spondylosis    Chronic lower back pain    COPD (chronic obstructive pulmonary disease) (HCC)    "little bit" (05/14/2017)   Coronary artery disease, non-occlusive    Coronary calcium scoring: June 2018: Score 1106. 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 Currently on Livalo 4 mg   Hypertension, benign    Lumbosacral spondylosis    PAD (peripheral artery disease) (HCC)    Status post right above-the-knee-below the knee  popliteal artery bypass; postop right ABI 0.96.   Pneumonia 2020   Sleep apnea    does not wear CPAP; "think it was medication related" (05/14/2017)   Stroke [facility]) 2010   TIA   Thoracic aortic ectasia (HCC)    ~40 mm on Coronary Calcium Score CT - recommend f/u CTA or MRA   TIA (transient ischemic attack) 09/2011   "several"    SURGICAL HISTORY: Past Surgical History:  Procedure Laterality Date   ABDOMINAL AORTOGRAM W/LOWER EXTREMITY N/A 05/14/2017   Procedure: Abdominal Aortogram w/Lower Extremity;  Surgeon: Conrad Chanhassen, MD;  Location: [place] [facility];  Service: Cardiovascular;  Laterality: N/A;   ACROMIO-CLAVICULAR JOINT REPAIR Left 1990s   "I think it was called an [facility] joint removal"   BACK SURGERY     BYPASS GRAFT POPLITEAL TO POPLITEAL Right 05/15/2017   Procedure: BYPASS GRAFT ABOVE KNEE POPLITEAL TO BELOW KNEE POPLITEAL ARTERY;  Surgeon: Conrad North Adams, MD;  Location: [facility] OR;  Service: Vascular;  Laterality: Right;   ESOPHAGOGASTRODUODENOSCOPY (EGD) WITH PROPOFOL N/A 04/29/2018   Procedure: ESOPHAGOGASTRODUODENOSCOPY (EGD) WITH PROPOFOL;  Surgeon: Otis Brace, MD;  Location: [facility];  Service: Gastroenterology;  Laterality: N/A;   FOOT FRACTURE SURGERY Bilateral    multiple procedures   FRACTURE SURGERY     JOINT REPLACEMENT     LOWER EXTREMITY ANGIOGRAPHY N/A 06/16/2020   Procedure: LOWER EXTREMITY ANGIOGRAPHY;  Surgeon: [name], MD;  Location: [place] [facility];  Service: Cardiovascular;  Laterality: N/A;   MAXIMUM ACCESS (MAS)POSTERIOR LUMBAR INTERBODY FUSION (PLIF)  1 LEVEL N/A 09/30/2014   Procedure: FOR MAXIMUM ACCESS (MAS) POSTERIOR LUMBAR INTERBODY FUSION (PLIF) 1 LEVEL;  Surgeon: Eustace Moore, MD;  Location: [place] [facility];  Service: Neurosurgery;  Laterality: N/A;  FOR MAXIMUM ACCESS (MAS) POSTERIOR LUMBAR INTERBODY FUSION (PLIF) 1 LEVEL LUMBAR 4-5   MULTIPLE TOOTH EXTRACTIONS Right 04/2017   NM MYOVIEW LTD  06/2017    Normal EF, 66%. No ST  changes. Normal study. LOW RISK.   PERIPHERAL VASCULAR ATHERECTOMY Left 06/16/2020   Procedure: PERIPHERAL VASCULAR ATHERECTOMY;  Surgeon: [name], MD;  Location: [place] [facility];  Service: Cardiovascular;  Laterality: Left;  SFA   TOTAL HIP ARTHROPLASTY Right 01/16/2020   Procedure: RIGHT TOTAL HIP ARTHROPLASTY ANTERIOR APPROACH;  Surgeon: Mcarthur Rossetti, MD;  Location: [facility];  Service: Orthopedics;  Laterality: Right;   TOTAL HIP ARTHROPLASTY Left 04/30/2020   Procedure: LEFT TOTAL HIP ARTHROPLASTY ANTERIOR APPROACH;  Surgeon: Mcarthur Rossetti, MD;  Location: [facility];  Service: Orthopedics;  Laterality: Left;   TRANSTHORACIC ECHOCARDIOGRAM  09/07/2013   Done to evaluate TIA: Normal LV size and function. EF 55-60%. GR 1 DD. Mild left atrial dilation. Mildly calcified mitral leaflets. Otherwise normal.    SOCIAL HISTORY: Social History   Socioeconomic History   Marital status: Widowed    Spouse name: [name]    Number of children: 0   Years of education: COLLEGE   Highest education level: Not on file  Occupational History   Occupation: Horse Breeder   Tobacco Use   Smoking status: Heavy Smoker    Packs/day: 0.02    Years: 36.00    Pack years: 0.72    Types: Cigarettes    Last attempt to quit: 04/24/2017    Years since quitting: 4.1   Smokeless tobacco: Never  Vaping Use   Vaping Use: Former  Substance and Sexual Activity   Alcohol use: Yes    Alcohol/week: 3.0 standard drinks    Types: 3 Glasses of wine per week   Drug use: No   Sexual activity: Never  Other Topics Concern   Not on file  Social History Narrative   Patient lives at home with her husband [name]. Patient has no children.    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    Patient has BA degree.    Smokes 2-3 cigarettes /day -- former heavy smoker (failed "quitting") -- now says she has truly "QUIT" - however now is been going back and forth between quitting and cheating  and probably smokes anywhere from 0-3 cigarettes a day.   Social Determinants of Health   Financial Resource Strain: Not on file  Food Insecurity: Not on file  Transportation Needs: Not on file  Physical Activity: Not on file  Stress: Not on file  Social Connections: Not on file  Intimate Partner Violence: Not on file    FAMILY HISTORY: Family History  Problem Relation Age of Onset   Stroke Father    Heart attack Mother    Stroke Mother     ALLERGIES:  is allergic to penicillins, statins, and lyrica [pregabalin].  MEDICATIONS:  Current Outpatient Medications  Medication Sig Dispense Refill   REPATHA SURECLICK 373 MG/ML SOAJ INJECT CONTENTS OF 1 PEN SUBCUTANEOUSLY EVERY 14 DAYS. 2 mL 11   albuterol (VENTOLIN HFA) 108 (90 Base) MCG/ACT inhaler Inhale 2 puffs into the lungs every 4 (four) hours as needed for wheezing or shortness of breath.     amLODipine (  NORVASC) 10 MG tablet Take 1 tablet (10 mg total) by mouth daily. 30 tablet 0   ANORO ELLIPTA 62.5-25 MCG/INH AEPB Inhale 2 puffs into the lungs daily as needed (wheezing).      ARIPiprazole (ABILIFY) 2 MG tablet Take 2 mg by mouth daily.     aspirin EC 81 MG tablet Take 81 mg by mouth daily. Swallow whole.     buPROPion (WELLBUTRIN XL) 150 MG 24 hr tablet Take 150 mg by mouth daily.  1   clopidogrel (PLAVIX) 75 MG tablet Take 1 tablet (75 mg total) by mouth daily. 30 tablet 0   desvenlafaxine (PRISTIQ) 100 MG 24 hr tablet Take 100 mg by mouth daily.     Estradiol 10 MCG TABS vaginal tablet Place 10 mcg vaginally once a week.      lisinopril (ZESTRIL) 10 MG tablet Take 10 mg by mouth daily.     methocarbamol (ROBAXIN) 500 MG tablet Take 1 tablet (500 mg total) by mouth every 6 (six) hours as needed for muscle spasms. 40 tablet 1   Multiple Vitamin (MULTIVITAMIN WITH MINERALS) TABS tablet Take 1 tablet by mouth daily. One-A-Day [place]     oxyCODONE (OXY IR/ROXICODONE) 5 MG immediate release tablet Take 1-2 tablets (5-10 mg  total) by mouth every 6 (six) hours as needed for moderate pain (pain score 4-6). (Patient not taking: Reported on 08/03/2020) 30 tablet 0   prazosin (MINIPRESS) 1 MG capsule Take 1 mg by mouth at bedtime.     traZODone (DESYREL) 50 MG tablet Take 50 mg by mouth at bedtime.     zolpidem (AMBIEN) 10 MG tablet Take 10 mg by mouth at bedtime.      No current facility-administered medications for this visit.    REVIEW OF SYSTEMS:   Constitutional: Denies fevers, chills or abnormal night sweats   All other systems were reviewed with the patient and are negative.  PHYSICAL EXAMINATION: ECOG PERFORMANCE STATUS: 1 - Symptomatic but completely ambulatory  Vitals:   06/23/21 1247  BP: (!) 156/95  Pulse: 90  Resp: 18  Temp: 97.7 F (36.5 C)  SpO2: 93%   Filed Weights   06/23/21 1247  Weight: 126 lb 8 oz (57.4 kg)     LABORATORY DATA:  I have reviewed the data as listed Lab Results  Component Value Date   WBC 10.7 (H) 06/23/2021   HGB 15.4 (H) 06/23/2021   HCT 43.5 06/23/2021   MCV 97.1 06/23/2021   PLT 322 06/23/2021   Lab Results  Component Value Date   NA 143 06/16/2020   K 3.6 06/16/2020   CL 103 06/16/2020   CO2 25 05/01/2020    RADIOGRAPHIC STUDIES: I have personally reviewed the radiological reports and agreed with the findings in the report.  ASSESSMENT AND PLAN:  Thrombocytosis Lab review: 05/01/2020: WBC 12.1, hemoglobin 12.8, platelets 319 04/19/2021: WBC 10.87, hemoglobin 15.8, platelets 558 06/23/2021: WBC 10.7, hemoglobin 15.4, platelets 322  Differential diagnosis 1. Primary thrombocytosis: Related to myeloproliferative disorders of the bone marrow especially essential thrombocytosis and CML. I would like to send for BCR-ABL as well as JAK-2 mutation testings. Patient understands that JAK2 mutation is only present in 50% of essential thrombocytosis so the test is advantageous only if it is positive. If it is negative, it does not rule out. 2.  Secondary/reactive thrombocytosis Different causes including infections, inflammation, iron deficiency.   Based on normalization of blood counts are suspect that the patient's cause of thrombocytosis is reactive in  nature.  Most likely related to chronic inflammation and the most recent upper respiratory infection.  Since the platelet count has normalized, I do not recommend doing any additional work-ups like JAK2 mutation testing or for further evaluation for CML or other myeloproliferative neoplasms. We would be happy to see her back if the platelet counts increase or if she has some other hematological problems.  Return to clinic on an as-needed basis.  All questions were answered. The patient knows to call the clinic with any problems, questions or concerns.    Harriette Ohara, MD  I, Reinaldo Raddle, am acting as scribe for Dr. Nicholas Lose, MD.

## 2021-06-23 ENCOUNTER — Other Ambulatory Visit: Payer: Self-pay

## 2021-06-23 ENCOUNTER — Inpatient Hospital Stay: Payer: Medicare Other | Attending: Hematology and Oncology

## 2021-06-23 ENCOUNTER — Inpatient Hospital Stay (HOSPITAL_BASED_OUTPATIENT_CLINIC_OR_DEPARTMENT_OTHER): Payer: Medicare Other | Admitting: Hematology and Oncology

## 2021-06-23 DIAGNOSIS — I2510 Atherosclerotic heart disease of native coronary artery without angina pectoris: Secondary | ICD-10-CM | POA: Diagnosis not present

## 2021-06-23 DIAGNOSIS — F17210 Nicotine dependence, cigarettes, uncomplicated: Secondary | ICD-10-CM | POA: Insufficient documentation

## 2021-06-23 DIAGNOSIS — J449 Chronic obstructive pulmonary disease, unspecified: Secondary | ICD-10-CM | POA: Insufficient documentation

## 2021-06-23 DIAGNOSIS — Z8249 Family history of ischemic heart disease and other diseases of the circulatory system: Secondary | ICD-10-CM

## 2021-06-23 DIAGNOSIS — Z79899 Other long term (current) drug therapy: Secondary | ICD-10-CM

## 2021-06-23 DIAGNOSIS — G894 Chronic pain syndrome: Secondary | ICD-10-CM | POA: Diagnosis not present

## 2021-06-23 DIAGNOSIS — D75839 Thrombocytosis, unspecified: Secondary | ICD-10-CM

## 2021-06-23 DIAGNOSIS — M961 Postlaminectomy syndrome, not elsewhere classified: Secondary | ICD-10-CM | POA: Diagnosis not present

## 2021-06-23 DIAGNOSIS — M5137 Other intervertebral disc degeneration, lumbosacral region: Secondary | ICD-10-CM | POA: Diagnosis not present

## 2021-06-23 DIAGNOSIS — Z7982 Long term (current) use of aspirin: Secondary | ICD-10-CM | POA: Diagnosis not present

## 2021-06-23 DIAGNOSIS — M87059 Idiopathic aseptic necrosis of unspecified femur: Secondary | ICD-10-CM | POA: Diagnosis not present

## 2021-06-23 LAB — CBC WITH DIFFERENTIAL (CANCER CENTER ONLY)
Abs Immature Granulocytes: 0.03 10*3/uL (ref 0.00–0.07)
Basophils Absolute: 0.1 10*3/uL (ref 0.0–0.1)
Basophils Relative: 1 %
Eosinophils Absolute: 0.2 10*3/uL (ref 0.0–0.5)
Eosinophils Relative: 2 %
HCT: 43.5 % (ref 36.0–46.0)
Hemoglobin: 15.4 g/dL — ABNORMAL HIGH (ref 12.0–15.0)
Immature Granulocytes: 0 %
Lymphocytes Relative: 32 %
Lymphs Abs: 3.5 10*3/uL (ref 0.7–4.0)
MCH: 34.4 pg — ABNORMAL HIGH (ref 26.0–34.0)
MCHC: 35.4 g/dL (ref 30.0–36.0)
MCV: 97.1 fL (ref 80.0–100.0)
Monocytes Absolute: 0.5 10*3/uL (ref 0.1–1.0)
Monocytes Relative: 5 %
Neutro Abs: 6.5 10*3/uL (ref 1.7–7.7)
Neutrophils Relative %: 60 %
Platelet Count: 322 10*3/uL (ref 150–400)
RBC: 4.48 MIL/uL (ref 3.87–5.11)
RDW: 12.9 % (ref 11.5–15.5)
WBC Count: 10.7 10*3/uL — ABNORMAL HIGH (ref 4.0–10.5)
nRBC: 0 % (ref 0.0–0.2)

## 2021-06-23 NOTE — Assessment & Plan Note (Signed)
Lab review: 05/01/2020: WBC 12.1, hemoglobin 12.8, platelets 319 04/19/2021: WBC 10.87, hemoglobin 15.8, platelets 558  Differential diagnosis 1. Primary thrombocytosis: Related to myeloproliferative disorders of the bone marrow especially essential thrombocytosis and CML. I would like to send for BCR-ABL as well as JAK-2 dictation testings. Patient understands that JAK2 mutation is only present in 50% of essential thrombocytosis so the test is advantageous only if it is positive. If it is negative, it does not rule out. 2. Secondary/reactive thrombocytosis Different causes including infections, inflammation, iron deficiency.  I would like to send out for C-reactive protein, iron studies with ferritin to complete the workup.  Treatment options: 1. If it is primary essential thrombocytosis, treatment would depend on platelet count level as well as history of thrombosis. A. For low risk patients, (platelet counts less than 1000 and no history of blood clots) the treatment would be with aspirin therapy B. for high risk patients(platelet counts greater than 1000/history of blood clot) the treatment would be platelet lowering therapy with aspirin 2. Treatment of secondary thrombocytosis would be to treat underlying cause. There would not be any risk of thrombosis with secondary thrombocytosis.  Return to clinic in 2 weeks with a MyChart virtual visit to discuss the results of these tests.

## 2021-07-07 DIAGNOSIS — F341 Dysthymic disorder: Secondary | ICD-10-CM | POA: Diagnosis not present

## 2021-07-07 DIAGNOSIS — F4312 Post-traumatic stress disorder, chronic: Secondary | ICD-10-CM | POA: Diagnosis not present

## 2021-07-07 DIAGNOSIS — Z634 Disappearance and death of family member: Secondary | ICD-10-CM | POA: Diagnosis not present

## 2021-07-07 DIAGNOSIS — G4709 Other insomnia: Secondary | ICD-10-CM | POA: Diagnosis not present

## 2021-07-08 DIAGNOSIS — Z20822 Contact with and (suspected) exposure to covid-19: Secondary | ICD-10-CM | POA: Diagnosis not present

## 2021-07-18 DIAGNOSIS — K209 Esophagitis, unspecified without bleeding: Secondary | ICD-10-CM | POA: Diagnosis not present

## 2021-07-18 DIAGNOSIS — K2970 Gastritis, unspecified, without bleeding: Secondary | ICD-10-CM | POA: Diagnosis not present

## 2021-07-18 DIAGNOSIS — K219 Gastro-esophageal reflux disease without esophagitis: Secondary | ICD-10-CM | POA: Diagnosis not present

## 2021-07-18 DIAGNOSIS — K2980 Duodenitis without bleeding: Secondary | ICD-10-CM | POA: Diagnosis not present

## 2021-07-18 DIAGNOSIS — Z1211 Encounter for screening for malignant neoplasm of colon: Secondary | ICD-10-CM | POA: Diagnosis not present

## 2021-07-18 DIAGNOSIS — K648 Other hemorrhoids: Secondary | ICD-10-CM | POA: Diagnosis not present

## 2021-07-18 DIAGNOSIS — D127 Benign neoplasm of rectosigmoid junction: Secondary | ICD-10-CM | POA: Diagnosis not present

## 2021-07-18 DIAGNOSIS — K2270 Barrett's esophagus without dysplasia: Secondary | ICD-10-CM | POA: Diagnosis not present

## 2021-07-18 DIAGNOSIS — K2289 Other specified disease of esophagus: Secondary | ICD-10-CM | POA: Diagnosis not present

## 2021-07-18 DIAGNOSIS — K5730 Diverticulosis of large intestine without perforation or abscess without bleeding: Secondary | ICD-10-CM | POA: Diagnosis not present

## 2021-07-18 DIAGNOSIS — K635 Polyp of colon: Secondary | ICD-10-CM | POA: Diagnosis not present

## 2021-07-20 ENCOUNTER — Other Ambulatory Visit: Payer: Self-pay | Admitting: Vascular Surgery

## 2021-07-20 NOTE — Telephone Encounter (Signed)
Please make appt

## 2021-07-22 DIAGNOSIS — K219 Gastro-esophageal reflux disease without esophagitis: Secondary | ICD-10-CM | POA: Diagnosis not present

## 2021-07-22 DIAGNOSIS — K635 Polyp of colon: Secondary | ICD-10-CM | POA: Diagnosis not present

## 2021-07-28 DIAGNOSIS — M79606 Pain in leg, unspecified: Secondary | ICD-10-CM | POA: Diagnosis not present

## 2021-07-28 DIAGNOSIS — M533 Sacrococcygeal disorders, not elsewhere classified: Secondary | ICD-10-CM | POA: Diagnosis not present

## 2021-07-28 DIAGNOSIS — Z79891 Long term (current) use of opiate analgesic: Secondary | ICD-10-CM | POA: Diagnosis not present

## 2021-07-28 DIAGNOSIS — M47817 Spondylosis without myelopathy or radiculopathy, lumbosacral region: Secondary | ICD-10-CM | POA: Diagnosis not present

## 2021-07-28 DIAGNOSIS — M961 Postlaminectomy syndrome, not elsewhere classified: Secondary | ICD-10-CM | POA: Diagnosis not present

## 2021-07-28 DIAGNOSIS — M5137 Other intervertebral disc degeneration, lumbosacral region: Secondary | ICD-10-CM | POA: Diagnosis not present

## 2021-07-28 DIAGNOSIS — G894 Chronic pain syndrome: Secondary | ICD-10-CM | POA: Diagnosis not present

## 2021-07-28 DIAGNOSIS — M87059 Idiopathic aseptic necrosis of unspecified femur: Secondary | ICD-10-CM | POA: Diagnosis not present

## 2021-07-28 DIAGNOSIS — M25559 Pain in unspecified hip: Secondary | ICD-10-CM | POA: Diagnosis not present

## 2021-07-28 DIAGNOSIS — M5459 Other low back pain: Secondary | ICD-10-CM | POA: Diagnosis not present

## 2021-07-28 DIAGNOSIS — Z79899 Other long term (current) drug therapy: Secondary | ICD-10-CM | POA: Diagnosis not present

## 2021-08-11 ENCOUNTER — Other Ambulatory Visit: Payer: Self-pay

## 2021-08-11 DIAGNOSIS — I739 Peripheral vascular disease, unspecified: Secondary | ICD-10-CM

## 2021-08-11 MED ORDER — CLOPIDOGREL BISULFATE 75 MG PO TABS: 75.0000 mg | ORAL_TABLET | Freq: Every day | ORAL | 0 refills | Status: DC

## 2021-09-02 ENCOUNTER — Ambulatory Visit (INDEPENDENT_AMBULATORY_CARE_PROVIDER_SITE_OTHER): Payer: Medicare Other | Admitting: Cardiology

## 2021-09-02 ENCOUNTER — Encounter: Payer: Self-pay | Admitting: Cardiology

## 2021-09-02 ENCOUNTER — Other Ambulatory Visit: Payer: Self-pay

## 2021-09-02 VITALS — BP 140/90 | HR 88 | Ht 65.0 in | Wt 125.4 lb

## 2021-09-02 DIAGNOSIS — E785 Hyperlipidemia, unspecified: Secondary | ICD-10-CM

## 2021-09-02 DIAGNOSIS — Z720 Tobacco use: Secondary | ICD-10-CM | POA: Diagnosis not present

## 2021-09-02 DIAGNOSIS — I10 Essential (primary) hypertension: Secondary | ICD-10-CM

## 2021-09-02 DIAGNOSIS — I77810 Thoracic aortic ectasia: Secondary | ICD-10-CM | POA: Diagnosis not present

## 2021-09-02 DIAGNOSIS — R7301 Impaired fasting glucose: Secondary | ICD-10-CM | POA: Diagnosis not present

## 2021-09-02 DIAGNOSIS — E876 Hypokalemia: Secondary | ICD-10-CM | POA: Diagnosis not present

## 2021-09-02 DIAGNOSIS — I679 Cerebrovascular disease, unspecified: Secondary | ICD-10-CM

## 2021-09-02 DIAGNOSIS — R0600 Dyspnea, unspecified: Secondary | ICD-10-CM

## 2021-09-02 DIAGNOSIS — I2510 Atherosclerotic heart disease of native coronary artery without angina pectoris: Secondary | ICD-10-CM

## 2021-09-02 DIAGNOSIS — R0609 Other forms of dyspnea: Secondary | ICD-10-CM

## 2021-09-02 MED ORDER — METOPROLOL TARTRATE 100 MG PO TABS
100.0000 mg | ORAL_TABLET | Freq: Once | ORAL | 0 refills | Status: DC
Start: 2021-09-02 — End: 2021-09-22

## 2021-09-02 NOTE — Patient Instructions (Addendum)
Medication Instructions:   SEE INSTRUCTION BELOW - METOPROLOL 100 MG ONE TIME ONLY   *If you need a refill on your cardiac medications before your next appointment, please call your pharmacy*   Lab Work: LIPID CMP [id number]  If you have labs (blood work) drawn today and your tests are completely normal, you will receive your results only by: [place] (if you have MyChart) OR A paper copy in the mail If you have any lab test that is abnormal or we need to change your treatment, we will call you to review the results.   Testing/Procedures: WILL BE SCHEDULE AFTER PRIOR AUTHORIZATION FROM INSURANCE.  AT [place]  Your physician has requested that you have cardiac CTA. Cardiac computed tomography (CT) is a painless test that uses an x-ray machine to take clear, detailed pictures of your heart.  Please follow instruction sheet as given.     Follow-Up: At [facility], you and your health needs are our priority.  As part of our continuing mission to provide you with exceptional heart care, we have created designated Provider Care Teams.  These Care Teams include your primary Cardiologist (physician) and Advanced Practice Providers (APPs -  Physician Assistants and Nurse Practitioners) who all work together to provide you with the care you need, when you need it.     Your next appointment:   2 month(s)  The format for your next appointment:   In Person  Provider:   Glenetta Hew, MD   Other Instructions    Your cardiac CT will be scheduled at the below location:   [facility] [address] [place], [place] [address] [phone]    please arrive at the [facility] main entrance (entrance A) of [facility] 30 minutes prior to test start time. Proceed to the [facility] Radiology Department (first floor) to check-in and test prep.    Please follow these instructions carefully (unless otherwise directed):  DO LABS ONE  WEEK PRIOR TO TEST   On the Night Before the Test: Be sure to Drink plenty of water. Do not consume any caffeinated/decaffeinated beverages or chocolate 12 hours prior to your test. Do not take any antihistamines 12 hours prior to your test.   On the Day of the Test: Drink plenty of water until 1 hour prior to the test. Do not eat any food 4 hours prior to the test. You may take your regular medications prior to the test.  Take metoprolol (Lopressor) 100 MG  two hours prior to test. FEMALES- please wear underwire-free bra if available, avoid dresses & tight clothing      After the Test: Drink plenty of water. After receiving IV contrast, you may experience a mild flushed feeling. This is normal. On occasion, you may experience a mild rash up to 24 hours after the test. This is not dangerous. If this occurs, you can take Benadryl 25 mg and increase your fluid intake. If you experience trouble breathing, this can be serious. If it is severe call 911 IMMEDIATELY. If it is mild, please call our office.  Please allow 2-4 weeks for scheduling of routine cardiac CTs. Some insurance companies require a pre-authorization which may delay scheduling of this test.   For non-scheduling related questions, please contact the cardiac imaging nurse navigator should you have any questions/concerns: Marchia Bond, Cardiac Imaging Nurse Navigator Gordy Clement, Cardiac Imaging Nurse Navigator [facility] Direct Office Dial: [phone]  For scheduling needs, including cancellations and rescheduling, please call [place], [phone].

## 2021-09-02 NOTE — Progress Notes (Signed)
Primary Care Provider: Michael Boston, MD Cardiologist: Glenetta Hew, MD Electrophysiologist: None  Clinic Note: Chief Complaint  Patient presents with   Follow-up    Delayed.   Shortness of Breath    Notable exertional dyspnea not associated with chest pain but some tightness.   Peripheral artery disease    Still has some claudication, but much better since intervention     ===================================  ASSESSMENT/PLAN   Problem List Items Addressed This Visit       Cardiology Problems   Coronary artery disease, non-occlusive (Chronic)    She was evaluated quite a while ago for ischemia with a Myoview stress test was nonischemic.  She does however have pretty significant PAD.  With her now having worsening dyspnea, and suggestion of aortic calcification, we will proceed with coronary CTA.  This will allow [place] to see both anatomy and then with FFR CT, we can check for physiology.  Plan: Continue clopidogrel and amlodipine along with lisinopril.  Not currently on beta-blocker, however may need to consider pending results of upcoming studies.  Has not been on beta-blocker because of COPD, would probably choose bisoprolol.  Will check coronary CT angiogram and CT FFR to exclude coronary ischemia.      Relevant Orders   EKG 12-Lead (Completed)   Lipid panel (Completed)   Comprehensive metabolic panel (Completed)   Hemoglobin A1c (Completed)   CT CORONARY MORPH W/CTA COR W/SCORE W/CA W/CM &/OR WO/CM (Completed)   Hyperlipidemia with target LDL less than 70 (Chronic)    Now on Repatha.  Lipids have been pretty well controlled and due to be rechecked in about a month -> we will go ahead and order labs for now along with chemistry and hemoglobin A1c.  She was intolerant of statins including Livalo.        Relevant Orders   Lipid panel (Completed)   Comprehensive metabolic panel (Completed)   Hemoglobin A1c (Completed)   CT CORONARY MORPH W/CTA COR W/SCORE W/CA W/CM  &/OR WO/CM (Completed)   Essential hypertension (Chronic)    Today.  On pretty high-dose amlodipine and lisinopril.  Pending results of CT scan, would probably consider beta-blocker such as bisoprolol.      Relevant Orders   EKG 12-Lead (Completed)   Comprehensive metabolic panel (Completed)   Hemoglobin A1c (Completed)   CT CORONARY MORPH W/CTA COR W/SCORE W/CA W/CM &/OR WO/CM (Completed)   Cerebrovascular disease (Chronic)    She is on baseline clopidogrel for protection, no longer on aspirin.  No recurrent TIA type symptoms.      Thoracic aortic ectasia (HCC) - Primary (Chronic)    Recommended to have a CT of the chest for follow-up.  With her now having exertional dyspnea, we can reassess with a coronary CTA to evaluate for ischemia based on the extent of PAD and now having progressive exertional dyspnea.  Plan: Coronary CT angiogram (with attention paid to ascending aorta)      Relevant Orders   EKG 12-Lead (Completed)   Comprehensive metabolic panel (Completed)   Hemoglobin A1c (Completed)   CT CORONARY MORPH W/CTA COR W/SCORE W/CA W/CM &/OR WO/CM (Completed)     Other   DOE (dyspnea on exertion)    She is not very active, and she has easy fatigue.  I think dyspnea can be combination of both inactivity, limited mobility with claudication, and lack of interest.  I am a little concerned for some of the time she has tightness in her chest, and therefore  I think with the extent of PAD that she has that we should evaluate for CAD.  Plan: Coronary CT angiogram with possible FFR. ->  She will need metoprolol to take for the procedure. In preparation for treatment more aggressively: Check lipid panel CMP and hemoglobin A1c in the next few weeks.      Relevant Orders   EKG 12-Lead (Completed)   Lipid panel (Completed)   Comprehensive metabolic panel (Completed)   Hemoglobin A1c (Completed)   CT CORONARY MORPH W/CTA COR W/SCORE W/CA W/CM &/OR WO/CM (Completed)   Hypokalemia     Probably lack of dietary intake, but may need to supplement.  Can reassess with pre-CT labs.      Tobacco abuse (Chronic)    Almost ready to fully quit.  I discussed how important it is based on her other underlying factors.      Relevant Orders   EKG 12-Lead (Completed)   Comprehensive metabolic panel (Completed)   Hemoglobin A1c (Completed)   CT CORONARY MORPH W/CTA COR W/SCORE W/CA W/CM &/OR WO/CM (Completed)   Other Visit Diagnoses     Impaired fasting glucose       Relevant Orders   Hemoglobin A1c (Completed)       ===================================  HPI:    Brenda Carney is a 70 y.o. female with a PMH Notable for Coronary Calcification -> Coronary Calcium Score > 1000, Nonischemic Nuclear Stress Test), PAD (history of R femoropopliteal bypass and right SFA PTA), HTN, HLD (on Repatha) & Centilobular Emphysema, Former Heavy Smoker (quit last yr)who presents today for essentially 68-monthfollow-up.  JSHAQUERA SANDSTEDTwas last seen on January 08, 2020 for preop evaluation.  Very stable from a cardiac standpoint.  Major issue was hip pain limiting her walking.  Prior to that was able to do 6-8 METS without or dyspnea.  Recent Hospitalizations: n/a  Reviewed  CV studies:    The following studies were reviewed today: (if available, images/films reviewed: From Epic Chart or Care Everywhere) 06/16/2020: Lower extremity arterial angiogram -> right SFA laser atherectomy and balloon angioplasty.  Interval History:   JKADIEDRA JARZABEKpresents now for almost a year and a half follow-up.  I have not seen her since her lower extremity angiogram and angioplasty.  She said that initially after having this procedure she felt much better as far far claudication.  She is also had bilateral hip surgeries done back in 2021.  She was walking quite a bit at the time postop, but now is gotten herself quite out of shape.  Really she is limited by leg pain with walking however she also notes that  she can get short of breath pretty easily (if she goes shopping and does not push a cart, she will definitely get short of breath.  It is hard for her to tell if it is exertional dyspnea or leg pain that limits her.  She does not really notice any "chest pain" as she would describe it, but does occasionally feel some tightness when she tried to catch her breath.  She does indicate that she is quite deconditioned and does have shortness of breath related to COPD but it seems like she is getting more progressive exertional dyspnea.  Otherwise she denies any heart failure symptoms of PND, orthopnea or edema.  Besides a few rare skipped beats, no real rapid irregular heartbeats palpitations.  He has been a long-term smoker and thinks that some of her dyspnea is related to smoking, but that seems  actually have stabilized.  She stopped smoking few years ago, and really does not notice things have improved very much.  CV Review of Symptoms (Summary) Cardiovascular ROS: positive for - dyspnea on exertion, shortness of breath, and exertional dyspnea is often associated with some tightness thought to be related to just shortness of breath.-Resolved at rest.  Occasional palpitations.,  But nothing prolonged.  Bilateral foot/lower leg claudication negative for - irregular heartbeat, orthopnea, palpitations, paroxysmal nocturnal dyspnea, or syncope/near syncope or TIA/amaurosis fugax, claudication  REVIEWED OF SYSTEMS   Review of Systems  Constitutional:  Positive for malaise/fatigue (Exercise intolerance.  Says she is out of shape) and weight loss (Not really eating as well as she was.  Had initially tried to lose weight, but now trying to stabilize.).  HENT:  Positive for hearing loss and tinnitus (Mild). Negative for congestion.   Respiratory:  Positive for cough (Morning cough is now left productive.  Less frequent.) and shortness of breath (Per HPI). Negative for sputum production.   Cardiovascular:   Positive for leg swelling.       Per HPI  Gastrointestinal:  Negative for constipation.  Genitourinary:  Negative for hematuria and urgency.  Musculoskeletal:  Negative for back pain and myalgias.  Neurological:  Positive for dizziness and headaches (Off and on.). Negative for focal weakness.  Psychiatric/Behavioral:  Negative for depression and memory loss. The patient is nervous/anxious. The patient does not have insomnia.     I have reviewed and (if needed) personally updated the patient's problem list, medications, allergies, past medical and surgical history, social and family history.   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   Cervical spondylosis    Chronic lower back pain    COPD (chronic obstructive pulmonary disease) (HCC)    "little bit" (05/14/2017)   Coronary artery disease, non-occlusive    Coronary calcium scoring: June 2018: Score 1106. 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   Pneumonia 2020   Sleep apnea    does not wear CPAP; "think it was medication related" (05/14/2017)   Stroke [facility]) 2010   TIA   Thoracic  aortic ectasia (HCC)    ~40 mm on Coronary Calcium Score CT - recommend f/u CTA or MRA   TIA (transient ischemic attack) 09/2011   "several"    PAST SURGICAL HISTORY   Past Surgical History:  Procedure Laterality Date   ABDOMINAL AORTOGRAM W/LOWER EXTREMITY N/A 05/14/2017   Procedure: Abdominal Aortogram w/Lower Extremity;  Surgeon: Conrad Church Hill, MD;  Location: [place] [facility];  Service: Cardiovascular;  Laterality: N/A;  ACROMIO-CLAVICULAR JOINT REPAIR Left 1990s   "I think it was called an [facility] joint removal"   BACK SURGERY     BYPASS GRAFT POPLITEAL TO POPLITEAL Right 05/15/2017   Procedure: BYPASS GRAFT ABOVE KNEE POPLITEAL TO BELOW KNEE POPLITEAL ARTERY;  Surgeon: Conrad Le Grand, MD;  Location[facility];  Service: Vascular;  Laterality: Right;   ESOPHAGOGASTRODUODENOSCOPY (EGD) WITH PROPOFOL N/A 04/29/2018   Procedure: ESOPHAGOGASTRODUODENOSCOPY (EGD) WITH PROPOFOL;  Surgeon: Otis Brace, MD;  Location: [place];  Service: Gastroenterology;  Laterality: N/A;   FOOT FRACTURE SURGERY Bilateral    multiple procedures   FRACTURE SURGERY     JOINT REPLACEMENT     LOWER EXTREMITY ANGIOGRAPHY N/A 06/16/2020   Procedure: LOWER EXTREMITY ANGIOGRAPHY;  Surgeon: [name], MD;  Location: [place] CV LAB;  Service: Cardiovascular;  Laterality: N/A;   MAXIMUM ACCESS (MAS)POSTERIOR LUMBAR INTERBODY FUSION (PLIF) 1 LEVEL N/A 09/30/2014   Procedure: FOR MAXIMUM ACCESS (MAS) POSTERIOR LUMBAR INTERBODY FUSION (PLIF) 1 LEVEL;  Surgeon: Eustace Moore, MD;  Location: [place] [facility];  Service: Neurosurgery;  Laterality: N/A;  FOR MAXIMUM ACCESS (MAS) POSTERIOR LUMBAR INTERBODY FUSION (PLIF) 1 LEVEL LUMBAR 4-5   MULTIPLE TOOTH EXTRACTIONS Right 04/2017   NM MYOVIEW LTD  06/2017    Normal EF, 66%. No ST changes. Normal study. LOW RISK.   PERIPHERAL VASCULAR ATHERECTOMY Left 06/16/2020   Procedure: PERIPHERAL VASCULAR ATHERECTOMY;  Surgeon: [name], MD;  Location: [place] CV  LAB;  Service: Cardiovascular;  Laterality: Left;  SFA   TOTAL HIP ARTHROPLASTY Right 01/16/2020   Procedure: RIGHT TOTAL HIP ARTHROPLASTY ANTERIOR APPROACH;  Surgeon: Mcarthur Rossetti, MD;  Location: [facility];  Service: Orthopedics;  Laterality: Right;   TOTAL HIP ARTHROPLASTY Left 04/30/2020   Procedure: LEFT TOTAL HIP ARTHROPLASTY ANTERIOR APPROACH;  Surgeon: Mcarthur Rossetti, MD;  Location: [facility];  Service: Orthopedics;  Laterality: Left;   TRANSTHORACIC ECHOCARDIOGRAM  09/07/2013   Done to evaluate TIA: Normal LV size and function. EF 55-60%. GR 1 DD. Mild left atrial dilation. Mildly calcified mitral leaflets. Otherwise normal.   5/22/021: Right AK Pop- BK Pop bypass with ipsilateral Non-reversed SVG, also Vein patch of BK Pop A for a post-procedure wound.  Stopped Smoking in 2020  Immunization History  Administered Date(s) Administered   PFIZER(Purple Top)SARS-COV-2 Vaccination 01/30/2020, 02/24/2020    MEDICATIONS/ALLERGIES   Current Meds  Medication Sig   albuterol (VENTOLIN HFA) 108 (90 Base) MCG/ACT inhaler Inhale 2 puffs into the lungs every 4 (four) hours as needed for wheezing or shortness of breath.   amLODipine (NORVASC) 10 MG tablet Take 1 tablet (10 mg total) by mouth daily.   ANORO ELLIPTA 62.5-25 MCG/INH AEPB Inhale 2 puffs into the lungs daily as needed (wheezing).    ARIPiprazole (ABILIFY) 2 MG tablet Take 2 mg by mouth daily.   aspirin EC 81 MG tablet Take 81 mg by mouth daily. Swallow whole.   buPROPion (WELLBUTRIN XL) 150 MG 24 hr tablet Take 150 mg by mouth daily.   clopidogrel (PLAVIX) 75 MG tablet Take 1 tablet (75 mg total) by mouth daily.   desvenlafaxine (PRISTIQ) 100 MG 24 hr tablet Take 100 mg by mouth daily.   Estradiol 10 MCG TABS vaginal tablet Place 10 mcg vaginally once a week.    lisinopril (ZESTRIL) 10 MG tablet Take 10 mg by mouth daily.   methocarbamol (ROBAXIN) 500 MG tablet Take 1 tablet (500 mg total) by mouth every 6 (six) hours as  needed for muscle spasms.   metoprolol tartrate (LOPRESSOR) 100 MG tablet Take 1 tablet (100 mg total) by mouth once for 1 dose. HOUR PRIOR TO HAVING CORONARY CTA   Multiple Vitamin (MULTIVITAMIN WITH MINERALS) TABS tablet Take 1 tablet by mouth daily. One-A-Day [place]   oxyCODONE (OXY IR/ROXICODONE) 5 MG immediate release tablet Take 1-2 tablets (5-10 mg total) by mouth every 6 (six) hours as needed for moderate pain (pain score 4-6).   prazosin (MINIPRESS) 1 MG capsule Take 1 mg by mouth at bedtime.   traZODone (DESYREL) 50 MG tablet Take 50 mg by mouth at bedtime.   zolpidem (AMBIEN) 10 MG tablet Take 10 mg by mouth at bedtime.    [DISCONTINUED] REPATHA SURECLICK [id number] MG/ML SOAJ INJECT CONTENTS OF 1 PEN SUBCUTANEOUSLY EVERY 14 DAYS.    Allergies  Allergen Reactions   Penicillins Swelling and Other (See Comments)    TONGUE SWELLING  PATIENT HAD A PCN REACTION WITH IMMEDIATE RASH, FACIAL/TONGUE/THROAT SWELLING, SOB, OR LIGHTHEADEDNESS WITH HYPOTENSION:  #  #  #  YES  #  #  #   Has patient had a PCN reaction causing severe rash involving mucus membranes or skin necrosis: No Has patient had a PCN reaction that required hospitalization: No Has patient had a PCN reaction occurring within the last 10 years: No    Statins     muscle pain   Lyrica [Pregabalin] Other (See Comments)    DRY MOUTH    SOCIAL HISTORY/FAMILY HISTORY   Reviewed in Epic:  Pertinent findings:  Social History   Tobacco Use   Smoking status: Heavy Smoker    Packs/day: 0.02    Years: 36.00    Pack years: 0.72    Types: Cigarettes    Last attempt to quit: 04/24/2017    Years since quitting: 4.3   Smokeless tobacco: Never  Vaping Use   Vaping Use: Former  Substance Use Topics   Alcohol use: Yes    Alcohol/week: 3.0 standard drinks    Types: 3 Glasses of wine per week   Drug use: No   Social History   Social History Narrative   Patient lives at home with her husband [name]. Patient has no children.     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    Patient has BA degree.    Smokes 1*-2 cigarettes /day, but not every day-- former heavy smoker (failed "quitting") -- now says she has truly "QUIT" - however now is been going back and forth between quitting and cheating and probably smokes anywhere from 0-3 cigarettes a day.    OBJCTIVE -PE, EKG, labs   Wt Readings from Last 3 Encounters:  09/02/21 125 lb 6.4 oz (56.9 kg)  06/23/21 126 lb 8 oz (57.4 kg)  08/03/20 139 lb (63 kg)    Physical Exam: BP 140/90 (BP Location: Right Arm)   Pulse 88   Ht '5\' 5"'$  (1.651 m)   Wt 125 lb 6.4 oz (56.9 kg)   SpO2 93%   BMI 20.87 kg/m  Physical Exam Constitutional:      General: She is not in acute distress.    Appearance: Normal appearance. She is normal weight. She is not ill-appearing or toxic-appearing.  HENT:     Head: Normocephalic and atraumatic.  Cardiovascular:     Rate and Rhythm: Normal rate and regular rhythm.     Chest Wall: PMI is not displaced.     Pulses: Normal pulses.  Heart sounds: Normal heart sounds, S1 normal and S2 normal. No murmur heard.   No friction rub. No gallop.  Pulmonary:     Effort: Pulmonary effort is normal. No respiratory distress.     Breath sounds: Normal breath sounds. No rhonchi (No true rhonchi, but mild diffuse basal crackles without rales.).  Abdominal:     General: Abdomen is flat. Bowel sounds are normal. There is no distension.     Palpations: Abdomen is soft. There is mass.     Hernia: A hernia is present.  Musculoskeletal:        General: No swelling.  Skin:    General: Skin is warm and dry.     Coloration: Skin is not pale.     Findings: No rash.  Neurological:     General: No focal deficit present.     Mental Status: She is alert and oriented to person, place, and time.     Gait: Gait (Somewhat slow gait.) normal.  Psychiatric:        Mood and Affect: Mood normal.        Behavior: Behavior normal.        Judgment:  Judgment normal.     Adult ECG Report  Rate: 88 ;  Rhythm:  Bilateral atrial enlargement.  Cannot exclude septal MI, age-indeterminate. ;   Narrative Interpretation: Stable EKG  Recent Labs:   10/05/2020: TC 150, TG 121, HDL 73, LDL 55.;  K+ 4.2, TSH 0.47.  Lab Results  Component Value Date   CHOL 205 (H) 08/13/2019   HDL 61 08/13/2019   LDLCALC 84 08/13/2019   LDLDIRECT 186 (H) 03/30/2015   TRIG 301 (H) 08/13/2019   CHOLHDL 5.3 05/15/2017   Lab Results  Component Value Date   CREATININE 0.60 06/16/2020   BUN 10 06/16/2020   NA 143 06/16/2020   K 3.6 06/16/2020   CL 103 06/16/2020   CO2 25 05/01/2020   CBC Latest Ref Rng & Units 06/23/2021 06/16/2020 05/01/2020  WBC 4.0 - 10.5 K/uL 10.7(H) - 12.1(H)  Hemoglobin 12.0 - 15.0 g/dL 15.4(H) 14.6 12.8  Hematocrit 36.0 - 46.0 % 43.5 43.0 37.5  Platelets 150 - 400 K/uL 322 - 309    Lab Results  Component Value Date   HGBA1C 5.6 09/05/2013   Lab Results  Component Value Date   TSH 0.735 06/11/2013    ==================================================  COVID-19 Education: The signs and symptoms of COVID-19 were discussed with the patient and how to seek care for testing (follow up with PCP or arrange E-visit).    I spent a total of 29  minutes with the patient spent in direct patient consultation.  Additional time spent with chart review  / charting (studies, outside notes, etc): 15 min Total Time: 44 min  Current medicines are reviewed at length with the patient today.  (+/- concerns) n/a  This visit occurred during the SARS-CoV-2 public health emergency.  Safety protocols were in place, including screening questions prior to the visit, additional usage of staff PPE, and extensive cleaning of exam room while observing appropriate contact time as indicated for disinfecting solutions.  Notice: This dictation was prepared with Dragon dictation along with smaller phrase technology. Any transcriptional errors that result from  this process are unintentional and may not be corrected upon review.  Patient Instructions / Medication Changes & Studies & Tests Ordered   Patient Instructions  Medication Instructions:   SEE INSTRUCTION BELOW - METOPROLOL 100 MG ONE TIME ONLY   *If  you need a refill on your cardiac medications before your next appointment, please call your pharmacy*   Lab Work: LIPID CMP HGBA1C  If you have labs (blood work) drawn today and your tests are completely normal, you will receive your results only by: [place] (if you have MyChart) OR A paper copy in the mail If you have any lab test that is abnormal or we need to change your treatment, we will call you to review the results.   Testing/Procedures: WILL BE SCHEDULE AFTER PRIOR AUTHORIZATION FROM INSURANCE.  AT [place]  Your physician has requested that you have cardiac CTA. Cardiac computed tomography (CT) is a painless test that uses an x-ray machine to take clear, detailed pictures of your heart.  Please follow instruction sheet as given.     Follow-Up: At [facility], you and your health needs are our priority.  As part of our continuing mission to provide you with exceptional heart care, we have created designated Provider Care Teams.  These Care Teams include your primary Cardiologist (physician) and Advanced Practice Providers (APPs -  Physician Assistants and Nurse Practitioners) who all work together to provide you with the care you need, when you need it.     Your next appointment:   2 month(s)  The format for your next appointment:   In Person  Provider:   Glenetta Hew, MD   Other Instructions    Your cardiac CT will be scheduled at the below location:   [facility] [address] [place], [place] [address] [phone]    please arrive at the [facility] main entrance (entrance A) of [facility] 30 minutes prior to test start time. Proceed to the  [facility] Radiology Department (first floor) to check-in and test prep.    Please follow these instructions carefully (unless otherwise directed):  DO LABS ONE WEEK PRIOR TO TEST   On the Night Before the Test: Be sure to Drink plenty of water. Do not consume any caffeinated/decaffeinated beverages or chocolate 12 hours prior to your test. Do not take any antihistamines 12 hours prior to your test.   On the Day of the Test: Drink plenty of water until 1 hour prior to the test. Do not eat any food 4 hours prior to the test. You may take your regular medications prior to the test.  Take metoprolol (Lopressor) 100 MG  two hours prior to test. FEMALES- please wear underwire-free bra if available, avoid dresses & tight clothing      After the Test: Drink plenty of water. After receiving IV contrast, you may experience a mild flushed feeling. This is normal. On occasion, you may experience a mild rash up to 24 hours after the test. This is not dangerous. If this occurs, you can take Benadryl 25 mg and increase your fluid intake. If you experience trouble breathing, this can be serious. If it is severe call 911 IMMEDIATELY. If it is mild, please call our office.  Please allow 2-4 weeks for scheduling of routine cardiac CTs. Some insurance companies require a pre-authorization which may delay scheduling of this test.   For non-scheduling related questions, please contact the cardiac imaging nurse navigator should you have any questions/concerns: Marchia Bond, Cardiac Imaging Nurse Navigator Gordy Clement, Cardiac Imaging Nurse Navigator [facility] Direct Office Dial: [phone]   For scheduling needs, including cancellations and rescheduling, please call [place], [phone].    Studies Ordered:  Orders Placed This Encounter  Procedures   CT CORONARY MORPH W/CTA COR W/SCORE W/CA W/CM &/OR WO/CM   Lipid panel   Comprehensive metabolic panel    Hemoglobin A1c   EKG 12-Lead      Glenetta Hew, M.D., M.S. Interventional Cardiologist   Pager # [phone] Phone # [phone] [address] [place], [place] [address]   Thank you for choosing [facility] at [facility]!!

## 2021-09-05 ENCOUNTER — Other Ambulatory Visit: Payer: Self-pay | Admitting: Cardiology

## 2021-09-06 ENCOUNTER — Encounter (HOSPITAL_COMMUNITY): Payer: Self-pay

## 2021-09-06 ENCOUNTER — Telehealth (HOSPITAL_COMMUNITY): Payer: Self-pay | Admitting: *Deleted

## 2021-09-06 NOTE — Telephone Encounter (Signed)
Reaching out to patient to offer assistance regarding upcoming cardiac imaging study; pt verbalizes understanding of appt date/time, parking situation and where to check in, pre-test NPO status and medications ordered, and verified current allergies; name and call back number provided for further questions should they arise  Gordy Clement RN [facility] [phone] office [phone] cell  Patient aware to get blood work prior to appointment. Patient to take '100mg'$  metoprolol tartrate two hours prior to cardiac CT scan.

## 2021-09-08 DIAGNOSIS — R0600 Dyspnea, unspecified: Secondary | ICD-10-CM | POA: Diagnosis not present

## 2021-09-08 DIAGNOSIS — M87059 Idiopathic aseptic necrosis of unspecified femur: Secondary | ICD-10-CM | POA: Diagnosis not present

## 2021-09-08 DIAGNOSIS — R7301 Impaired fasting glucose: Secondary | ICD-10-CM | POA: Diagnosis not present

## 2021-09-08 DIAGNOSIS — M5137 Other intervertebral disc degeneration, lumbosacral region: Secondary | ICD-10-CM | POA: Diagnosis not present

## 2021-09-08 DIAGNOSIS — I2510 Atherosclerotic heart disease of native coronary artery without angina pectoris: Secondary | ICD-10-CM | POA: Diagnosis not present

## 2021-09-08 DIAGNOSIS — I10 Essential (primary) hypertension: Secondary | ICD-10-CM | POA: Diagnosis not present

## 2021-09-08 DIAGNOSIS — I77810 Thoracic aortic ectasia: Secondary | ICD-10-CM | POA: Diagnosis not present

## 2021-09-08 DIAGNOSIS — G894 Chronic pain syndrome: Secondary | ICD-10-CM | POA: Diagnosis not present

## 2021-09-08 DIAGNOSIS — M47817 Spondylosis without myelopathy or radiculopathy, lumbosacral region: Secondary | ICD-10-CM | POA: Diagnosis not present

## 2021-09-08 DIAGNOSIS — E785 Hyperlipidemia, unspecified: Secondary | ICD-10-CM | POA: Diagnosis not present

## 2021-09-08 DIAGNOSIS — Z720 Tobacco use: Secondary | ICD-10-CM | POA: Diagnosis not present

## 2021-09-09 ENCOUNTER — Other Ambulatory Visit: Payer: Self-pay

## 2021-09-09 ENCOUNTER — Ambulatory Visit (HOSPITAL_COMMUNITY)
Admission: RE | Admit: 2021-09-09 | Discharge: 2021-09-09 | Disposition: A | Discharge status: Home / Self Care | Payer: Medicare Other | Source: Ambulatory Visit | Attending: Cardiology | Admitting: Cardiology

## 2021-09-09 DIAGNOSIS — R0600 Dyspnea, unspecified: Secondary | ICD-10-CM | POA: Insufficient documentation

## 2021-09-09 DIAGNOSIS — I2510 Atherosclerotic heart disease of native coronary artery without angina pectoris: Secondary | ICD-10-CM

## 2021-09-09 DIAGNOSIS — I77810 Thoracic aortic ectasia: Secondary | ICD-10-CM

## 2021-09-09 DIAGNOSIS — I10 Essential (primary) hypertension: Secondary | ICD-10-CM | POA: Diagnosis not present

## 2021-09-09 DIAGNOSIS — Z720 Tobacco use: Secondary | ICD-10-CM | POA: Diagnosis not present

## 2021-09-09 DIAGNOSIS — E785 Hyperlipidemia, unspecified: Secondary | ICD-10-CM | POA: Diagnosis not present

## 2021-09-09 DIAGNOSIS — R0609 Other forms of dyspnea: Secondary | ICD-10-CM

## 2021-09-09 LAB — COMPREHENSIVE METABOLIC PANEL
ALT: 15 IU/L (ref 0–32)
AST: 20 IU/L (ref 0–40)
Albumin/Globulin Ratio: 2.3 — ABNORMAL HIGH (ref 1.2–2.2)
Albumin: 4.5 g/dL (ref 3.8–4.8)
Alkaline Phosphatase: 93 IU/L (ref 44–121)
BUN/Creatinine Ratio: 10 — ABNORMAL LOW (ref 12–28)
BUN: 7 mg/dL — ABNORMAL LOW (ref 8–27)
Bilirubin Total: 0.5 mg/dL (ref 0.0–1.2)
CO2: 22 mmol/L (ref 20–29)
Calcium: 9.6 mg/dL (ref 8.7–10.3)
Chloride: 98 mmol/L (ref 96–106)
Creatinine, Ser: 0.73 mg/dL (ref 0.57–1.00)
Globulin, Total: 2 g/dL (ref 1.5–4.5)
Glucose: 103 mg/dL — ABNORMAL HIGH (ref 65–99)
Potassium: 3.5 mmol/L (ref 3.5–5.2)
Sodium: 138 mmol/L (ref 134–144)
Total Protein: 6.5 g/dL (ref 6.0–8.5)
eGFR: 88 mL/min/{1.73_m2} (ref >59)

## 2021-09-09 LAB — HEMOGLOBIN A1C
Est. average glucose Bld gHb Est-mCnc: 108 mg/dL
Hgb A1c MFr Bld: 5.4 % (ref 4.8–5.6)

## 2021-09-09 LAB — LIPID PANEL
Chol/HDL Ratio: 1.9 ratio (ref 0.0–4.4)
Cholesterol, Total: 153 mg/dL (ref 100–199)
HDL: 79 mg/dL (ref >39)
LDL Chol Calc (NIH): 54 mg/dL (ref 0–99)
Triglycerides: 116 mg/dL (ref 0–149)
VLDL Cholesterol Cal: 20 mg/dL (ref 5–40)

## 2021-09-09 MED ORDER — IOHEXOL 350 MG/ML SOLN
100.0000 mL | Freq: Once | INTRAVENOUS | Status: AC | PRN
  Administered 2021-09-09: 100 mL via INTRAVENOUS

## 2021-09-09 MED ORDER — NITROGLYCERIN 0.4 MG SL SUBL
SUBLINGUAL_TABLET | SUBLINGUAL | Status: AC
  Filled 2021-09-09: qty 2

## 2021-09-09 MED ORDER — NITROGLYCERIN 0.4 MG SL SUBL
0.8000 mg | SUBLINGUAL_TABLET | Freq: Once | SUBLINGUAL | Status: AC
  Administered 2021-09-09: 0.8 mg via SUBLINGUAL

## 2021-09-10 ENCOUNTER — Other Ambulatory Visit (HOSPITAL_COMMUNITY): Payer: Self-pay | Admitting: Emergency Medicine

## 2021-09-10 DIAGNOSIS — R931 Abnormal findings on diagnostic imaging of heart and coronary circulation: Secondary | ICD-10-CM

## 2021-09-10 DIAGNOSIS — R079 Chest pain, unspecified: Secondary | ICD-10-CM

## 2021-09-12 ENCOUNTER — Encounter: Payer: Self-pay | Admitting: Cardiology

## 2021-09-12 ENCOUNTER — Ambulatory Visit (HOSPITAL_COMMUNITY)
Admission: RE | Admit: 2021-09-12 | Discharge: 2021-09-12 | Disposition: A | Discharge status: Home / Self Care | Payer: Medicare Other | Source: Ambulatory Visit | Attending: Cardiology | Admitting: Cardiology

## 2021-09-12 DIAGNOSIS — Z23 Encounter for immunization: Secondary | ICD-10-CM | POA: Diagnosis not present

## 2021-09-12 DIAGNOSIS — I2510 Atherosclerotic heart disease of native coronary artery without angina pectoris: Secondary | ICD-10-CM | POA: Diagnosis not present

## 2021-09-12 DIAGNOSIS — R931 Abnormal findings on diagnostic imaging of heart and coronary circulation: Secondary | ICD-10-CM

## 2021-09-12 NOTE — Assessment & Plan Note (Signed)
Today.  On pretty high-dose amlodipine and lisinopril.  Pending results of CT scan, would probably consider beta-blocker such as bisoprolol.

## 2021-09-12 NOTE — Assessment & Plan Note (Signed)
She was evaluated quite a while ago for ischemia with a Myoview stress test was nonischemic.  She does however have pretty significant PAD.  With her now having worsening dyspnea, and suggestion of aortic calcification, we will proceed with coronary CTA.  This will allow [place] to see both anatomy and then with FFR CT, we can check for physiology.  Plan: Continue clopidogrel and amlodipine along with lisinopril.  Not currently on beta-blocker, however may need to consider pending results of upcoming studies.  Has not been on beta-blocker because of COPD, would probably choose bisoprolol.   Will check coronary CT angiogram and CT FFR to exclude coronary ischemia.

## 2021-09-12 NOTE — Assessment & Plan Note (Signed)
She is on baseline clopidogrel for protection, no longer on aspirin.  No recurrent TIA type symptoms.

## 2021-09-12 NOTE — Assessment & Plan Note (Signed)
Probably lack of dietary intake, but may need to supplement.  Can reassess with pre-CT labs.

## 2021-09-12 NOTE — Assessment & Plan Note (Signed)
She is not very active, and she has easy fatigue.  I think dyspnea can be combination of both inactivity, limited mobility with claudication, and lack of interest.  I am a little concerned for some of the time she has tightness in her chest, and therefore I think with the extent of PAD that she has that we should evaluate for CAD.  Plan: Coronary CT angiogram with possible FFR. ->  She will need metoprolol to take for the procedure. In preparation for treatment more aggressively: Check lipid panel CMP and hemoglobin A1c in the next few weeks.

## 2021-09-12 NOTE — Assessment & Plan Note (Signed)
Recommended to have a CT of the chest for follow-up.  With her now having exertional dyspnea, we can reassess with a coronary CTA to evaluate for ischemia based on the extent of PAD and now having progressive exertional dyspnea.  Plan: Coronary CT angiogram (with attention paid to ascending aorta)

## 2021-09-12 NOTE — Assessment & Plan Note (Signed)
Almost ready to fully quit.  I discussed how important it is based on her other underlying factors.

## 2021-09-12 NOTE — Assessment & Plan Note (Signed)
Now on Repatha.  Lipids have been pretty well controlled and due to be rechecked in about a month -> we will go ahead and order labs for now along with chemistry and hemoglobin A1c.  She was intolerant of statins including Livalo.

## 2021-09-22 ENCOUNTER — Ambulatory Visit (INDEPENDENT_AMBULATORY_CARE_PROVIDER_SITE_OTHER): Payer: Medicare Other | Admitting: Cardiology

## 2021-09-22 ENCOUNTER — Other Ambulatory Visit: Payer: Self-pay

## 2021-09-22 ENCOUNTER — Other Ambulatory Visit: Payer: Self-pay | Admitting: *Deleted

## 2021-09-22 ENCOUNTER — Encounter: Payer: Self-pay | Admitting: Cardiology

## 2021-09-22 VITALS — BP 134/88 | HR 50 | Resp 95 | Ht 65.0 in | Wt 122.6 lb

## 2021-09-22 DIAGNOSIS — I739 Peripheral vascular disease, unspecified: Secondary | ICD-10-CM | POA: Diagnosis not present

## 2021-09-22 DIAGNOSIS — E785 Hyperlipidemia, unspecified: Secondary | ICD-10-CM | POA: Diagnosis not present

## 2021-09-22 DIAGNOSIS — I77810 Thoracic aortic ectasia: Secondary | ICD-10-CM | POA: Diagnosis not present

## 2021-09-22 DIAGNOSIS — R0600 Dyspnea, unspecified: Secondary | ICD-10-CM | POA: Diagnosis not present

## 2021-09-22 DIAGNOSIS — R931 Abnormal findings on diagnostic imaging of heart and coronary circulation: Secondary | ICD-10-CM

## 2021-09-22 DIAGNOSIS — R0609 Other forms of dyspnea: Secondary | ICD-10-CM

## 2021-09-22 DIAGNOSIS — I10 Essential (primary) hypertension: Secondary | ICD-10-CM | POA: Diagnosis not present

## 2021-09-22 DIAGNOSIS — I2510 Atherosclerotic heart disease of native coronary artery without angina pectoris: Secondary | ICD-10-CM

## 2021-09-22 LAB — CBC
Hematocrit: 50 % — ABNORMAL HIGH (ref 34.0–46.6)
Hemoglobin: 17.1 g/dL — ABNORMAL HIGH (ref 11.1–15.9)
MCH: 33.9 pg — ABNORMAL HIGH (ref 26.6–33.0)
MCHC: 34.2 g/dL (ref 31.5–35.7)
MCV: 99 fL — ABNORMAL HIGH (ref 79–97)
Platelets: 385 10*3/uL (ref 150–450)
RBC: 5.04 x10E6/uL (ref 3.77–5.28)
RDW: 12.2 % (ref 11.7–15.4)
WBC: 10.2 10*3/uL (ref 3.4–10.8)

## 2021-09-22 NOTE — Assessment & Plan Note (Addendum)
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  With progressive exertional dyspnea, but this is potentially an anginal equivalent.  We discussed options of just dressing titration of medical management versus considering catheterization and PCI.  After a prolonged discussion, we determined to proceed with cardiac catheterization and possible PCI of the LCx CX-LM. She is already on aspirin and Plavix as well as a statin. Not on beta-blocker because of bradycardia, but is on amlodipine 10 mg daily and daily.  Told to hold the lisinopril the morning of-take it at nighttime and take the amlodipine the morning of the procedure.  I explained the heart catheterization in detail, and explained all the risks, benefits and alternatives.  See consent information below.

## 2021-09-22 NOTE — Assessment & Plan Note (Signed)
CTA did not show any evidence of enlarged aorta.

## 2021-09-22 NOTE — Assessment & Plan Note (Signed)
She clearly is deconditioned and some of her dyspnea is related to that as well as her underlying lung disease, however with the symptoms of tightness in her chest and progressively worsening dyspnea, there is concern that it is potentially ischemic in nature.  CT FFR was positive for Lesion.  Plan: Proceed with cardiac catheterization and possible PCI.

## 2021-09-22 NOTE — Patient Instructions (Addendum)
Medication Instructions:  No changes   *If you need a refill on your cardiac medications before your next appointment, please call your pharmacy*   Lab Work: CBC today   If you have labs (blood work) drawn today and your tests are completely normal, you will receive your results only by: [place] (if you have MyChart) OR A paper copy in the mail If you have any lab test that is abnormal or we need to change your treatment, we will call you to review the results.   Testing/Procedures:  Will be schedule at [facility] cath lab [place] has requested that you have a cardiac catheterization. Cardiac catheterization is used to diagnose and/or treat various heart conditions. Doctors may recommend this procedure for a number of different reasons. The most common reason is to evaluate chest pain. Chest pain can be a symptom of coronary artery disease (CAD), and cardiac catheterization can show whether plaque is narrowing or blocking your heart's arteries. This procedure is also used to evaluate the valves, as well as measure the blood flow and oxygen levels in different parts of your heart. For further information please visit [email or web address]. Please follow instruction sheet, as given.   Follow-Up: At [facility], you and your health needs are our priority.  As part of our continuing mission to provide you with exceptional heart care, we have created designated Provider Care Teams.  These Care Teams include your primary Cardiologist (physician) and Advanced Practice Providers (APPs -  Physician Assistants and Nurse Practitioners) who all work together to provide you with the care you need, when you need it.     Your next appointment:     Keep appointment on NOV 8 , 2022  The format for your next appointment:   In Person  Provider:   Glenetta Hew, MD   Other Instructions    [address] [place] [address] Dept: [phone] Loc: [phone]  JANYIAH SILVERI  09/22/2021  You are scheduled for a Cardiac Catheterization on Friday, October 7 with Dr. Glenetta Hew.  1. Please arrive at the [facility] (Main Entrance A) at [facility]: [address] [place], [place] [address] at 10:00 AM (This time is two hours before your procedure to ensure your preparation). Free valet parking service is available.   Special note: Every effort is made to have your procedure done on time. Please understand that emergencies sometimes delay scheduled procedures.  2. Diet: Do not eat solid foods after midnight.  The patient may have clear liquids until 5am upon the day of the procedure.  3. Labs: You will need to have blood drawn on [facility] [address], [place]  Open: 8am - 5pm (Lunch 12:30 - 1:30)   Phone: [phone], September 29 at . You do not need to be fasting.  4. Medication instructions in preparation for your procedure:   Contrast Allergy: No   Stop taking, Lisinopril (Zestril or Prinivil) Friday, October 7,- ONLY  On the morning of your procedure, take your Aspirin 81 mg and Plavix/Clopidogrel 75 mg and any morning medicines NOT listed above.  You may use sips of water.  5. Plan for one night stay--bring personal belongings. 6. Bring a current list of your medications and current insurance cards. 7. You MUST have a responsible person to drive you home. 8. Someone MUST  be with you the first 24 hours after you arrive home or your discharge will be delayed. 9. Please wear clothes that are easy to get on and off and wear slip-on shoes.  Thank you for allowing [place] to care for you!   -- [facility]

## 2021-09-22 NOTE — H&P (View-Only) (Signed)
Primary Care Provider: Michael Boston, MD Cardiologist: Glenetta Hew, MD Electrophysiologist: None  Clinic Note: Chief Complaint  Patient presents with   2 Week Follow-up    Test results   Shortness of Breath    Exertional.   Coronary Artery Disease    Positive CTA FFR     ===================================  ASSESSMENT/PLAN   Problem List Items Addressed This Visit       Cardiology Problems   Hyperlipidemia with target LDL less than 70 (Chronic)    She is currently on Repatha and her lipids are well controlled based on last readings.  History of statin myopathy.      Essential hypertension (Chronic)    On amlodipine and lisinopril.  Not on beta-blocker because of bradycardia.  Blood pressure is borderline today.  We can reassess in the.  She does have room to increase lisinopril little bit, but has had some low pressures in the past.  Glenetta Hew, MD       PAD (peripheral artery disease) ([place]) (Chronic)    Followed by vascular surgery.      Thoracic aortic ectasia (HCC) - Primary (Chronic)    CTA did not show any evidence of enlarged aorta.        Other   DOE (dyspnea on exertion)    She clearly is deconditioned and some of her dyspnea is related to that as well as her underlying lung disease, however with the symptoms of tightness in her chest and progressively worsening dyspnea, there is concern that it is potentially ischemic in nature.  CT FFR was positive for Lesion.  Plan: Proceed with cardiac catheterization and possible PCI.      Relevant Orders   CBC   LEFT HEART CATHETERIZATION WITH CORONARY ANGIOGRAM   Abnormal cardiac CT angiography    Cardiac CTA shows FFR positive lesion in the major OM branch.  With progressive exertional dyspnea, but this is potentially an anginal equivalent.  We discussed options of just dressing titration of medical management versus considering catheterization and PCI.  After a prolonged discussion, we  determined to proceed with cardiac catheterization and possible PCI of the LCx CX-LM. She is already on aspirin and Plavix as well as a statin. Not on beta-blocker because of bradycardia, but is on amlodipine 10 mg daily and daily.  Told to hold the lisinopril the morning of-take it at nighttime and take the amlodipine the morning of the procedure.  I explained the heart catheterization in detail, and explained all the risks, benefits and alternatives.  See consent information below.      Relevant Orders   CBC   LEFT HEART CATHETERIZATION WITH CORONARY ANGIOGRAM   ===================================  HPI:    Brenda Carney is a 70 y.o. female with a PMH Notable for Coronary Calcification -> Coronary Calcium Score > 1000, Nonischemic Nuclear Stress Test), PAD (history of R femoropopliteal bypass and right SFA PTA), HTN, HLD (on Repatha) & Centilobular Emphysema, Former Heavy Smoker (quit last yr)who presents today to discuss results of her Coronary CT Angiogram with positive CT FFR of the OM1.  January 08, 2020 for preop evaluation.  Very stable from a cardiac standpoint.  Major issue was hip pain limiting her walking.  Prior to that was able to do 6-8 METS without or dyspnea.  Brenda Carney was last seen on September 02, 2021 for roughly 51-month follow-up.  She noted that initially after her lower extremity angioplasty, she did much better further claudication symptoms.  She also had hip surgeries.  What she noted though was that over the last few months she has been getting much more short of breath with less activity than she had been in the past.  She did not necessarily note exertional chest pain but does have occasional tightness in the chest when she is trying to catch her breath.  She has baseline dyspnea from COPD, but the exertional dyspnea is Progressively worse.  Therefore chose to evaluate with a Cardiac CT Angiogram  Recent Hospitalizations: n/a  Reviewed  CV studies:     The following studies were reviewed today: (if available, images/films reviewed: From Epic Chart or Care Everywhere) 06/16/2020: Lower extremity arterial angiogram -> right SFA laser atherectomy and balloon angioplasty. Coronary CTA 09/09/2021: Calcium score 1613.  Moderate RCA, LAD disease.  FFR normal.  Significant (> 70%) mid OM disease (FFR abnormal 0.75).  Severe mitral annular calcification and diffuse aortic atherosclerosis. (LAD 507, LCx 268, RCA 838)  Interval History:   Brenda Carney presents to discuss results of her study.  She seems to be a relatively poor historian not quite understanding the reasoning behind what was ordered and what it entailed.  This is despite the fact that I went and did quite a bit of detail explaining the Coronary CTA to her.  She still has exertional dyspnea, but cannot remember if she mentioned in the MI chest pain or not.  She does not think she has chest pain per se but acknowledges that when she is short of breath there is some tightness in her chest.  No resting dyspnea and besides her baseline COPD.  No prolonged irregular heartbeats or palpitations, just simply a few skipped beats here and there.  CV Review of Symptoms (Summary) Cardiovascular ROS: positive for - dyspnea on exertion, shortness of breath, and some chest tightness associated with dyspnea that resolved at rest.Occasional palpitations.,  But nothing prolonged.  Bilateral foot/lower leg claudication negative for - edema, irregular heartbeat, orthopnea, palpitations, paroxysmal nocturnal dyspnea, rapid heart rate, or syncope/near syncope or TIA/amaurosis fugax, claudication  REVIEWED OF SYSTEMS   Review of Systems  Constitutional:  Positive for malaise/fatigue (She does acknowledge deconditioning with exercise intolerance and appeared quite out of shape.) and weight loss (She actually was initially trying to lose weight, but now just trying to stabilize.  She has not notably lost weight  since 2018.Marland Kitchen).  HENT:  Positive for hearing loss and tinnitus (Mild). Negative for congestion.   Respiratory:  Positive for cough (Morning cough is now left productive.  Less frequent.) and shortness of breath (Exertional dyspnea Per HPI.  Otherwise has baseline dyspnea.). Negative for sputum production.   Cardiovascular:  Positive for claudication. Negative for leg swelling.       Per HPI  Gastrointestinal:  Negative for constipation.  Genitourinary:  Negative for hematuria and urgency.  Musculoskeletal:  Negative for back pain and myalgias.  Neurological:  Positive for dizziness and headaches (Off and on.). Negative for focal weakness.  Psychiatric/Behavioral:  Negative for depression and memory loss. The patient is nervous/anxious. The patient does not have insomnia.     I have reviewed and (if needed) personally updated the patient's problem list, medications, allergies, past medical and surgical history, social and family history.   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   Cervical spondylosis    Chronic lower back pain    COPD (chronic obstructive pulmonary disease) (HCC)    "little bit" (05/14/2017)   Coronary artery disease, non-occlusive    Coronary calcium scoring: June 2018: Score 1106. 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   Pneumonia 2020   Sleep apnea    does not wear CPAP; "think it was medication related" (05/14/2017)   Stroke [facility]) 2010   TIA   Thoracic aortic ectasia (HCC)    ~40 mm on Coronary Calcium Score CT - recommend f/u CTA or MRA   TIA (transient ischemic attack) 09/2011   "several"    PAST SURGICAL HISTORY   Past Surgical History:  Procedure Laterality Date   ABDOMINAL AORTOGRAM W/LOWER EXTREMITY N/A 05/14/2017   Procedure: Abdominal Aortogram w/Lower Extremity;  Surgeon: Conrad McDermitt, MD;  Location: [place] [facility];  Service: Cardiovascular;  Laterality: N/A;   ACROMIO-CLAVICULAR JOINT REPAIR Left 1990s   "I think it was called an [facility] joint removal"   BACK SURGERY     BYPASS GRAFT POPLITEAL TO POPLITEAL Right 05/15/2017   Procedure: BYPASS GRAFT ABOVE KNEE POPLITEAL TO BELOW KNEE POPLITEAL ARTERY;  Surgeon: Conrad Carrier, MD;  Location: [facility] OR;  Service: Vascular;  Laterality: Right;   ESOPHAGOGASTRODUODENOSCOPY (EGD) WITH PROPOFOL N/A 04/29/2018   Procedure: ESOPHAGOGASTRODUODENOSCOPY (EGD) WITH PROPOFOL;  Surgeon: Otis Brace, MD;  Location: [place];  Service: Gastroenterology;  Laterality: N/A;   FOOT FRACTURE SURGERY Bilateral    multiple procedures   FRACTURE SURGERY     JOINT REPLACEMENT     LOWER EXTREMITY ANGIOGRAPHY N/A 06/16/2020   Procedure: LOWER EXTREMITY ANGIOGRAPHY;  Surgeon: [name], MD;  Location: [place] [facility];  Service: Cardiovascular;  Laterality: N/A;   MAXIMUM ACCESS (MAS)POSTERIOR LUMBAR INTERBODY FUSION (PLIF) 1 LEVEL N/A 09/30/2014   Procedure: FOR MAXIMUM ACCESS (MAS) POSTERIOR LUMBAR INTERBODY FUSION (PLIF) 1 LEVEL;  Surgeon: Eustace Moore, MD;  Location: [place] [facility];  Service: Neurosurgery;  Laterality: N/A;  FOR MAXIMUM  ACCESS (MAS) POSTERIOR LUMBAR INTERBODY FUSION (PLIF) 1 LEVEL LUMBAR 4-5   MULTIPLE TOOTH EXTRACTIONS Right 04/2017   NM MYOVIEW LTD  06/2017    Normal EF, 66%. No ST changes. Normal study. LOW RISK.   PERIPHERAL VASCULAR ATHERECTOMY Left 06/16/2020   Procedure: PERIPHERAL VASCULAR ATHERECTOMY;  Surgeon: [name], MD;  Location: [place] [facility];  Service: Cardiovascular;  Laterality: Left;  SFA   TOTAL HIP ARTHROPLASTY Right 01/16/2020   Procedure: RIGHT TOTAL HIP ARTHROPLASTY ANTERIOR APPROACH;  Surgeon: Mcarthur Rossetti, MD;  Location: [facility];  Service: Orthopedics;  Laterality: Right;   TOTAL HIP ARTHROPLASTY Left 04/30/2020   Procedure: LEFT TOTAL HIP ARTHROPLASTY ANTERIOR APPROACH;  Surgeon:  Mcarthur Rossetti, MD;  Location: [facility];  Service: Orthopedics;  Laterality: Left;   TRANSTHORACIC ECHOCARDIOGRAM  09/07/2013   Done to evaluate TIA: Normal LV size and function. EF 55-60%. GR 1 DD. Mild left atrial dilation. Mildly calcified mitral leaflets. Otherwise normal.   5/22/021: Right AK Pop- BK Pop bypass with ipsilateral Non-reversed SVG, also Vein patch of BK Pop A for a post-procedure wound.  Stopped Smoking in 2020  Immunization History  Administered Date(s) Administered   PFIZER(Purple Top)SARS-COV-2 Vaccination 01/30/2020, 02/24/2020    MEDICATIONS/ALLERGIES   Current Meds  Medication Sig   albuterol (VENTOLIN HFA) 108 (90 Base) MCG/ACT inhaler Inhale 2 puffs into the lungs every 4 (four) hours as needed for wheezing or shortness of breath.   amLODipine (NORVASC) 10 MG tablet Take 1 tablet (10 mg total) by mouth daily.   ANORO ELLIPTA 62.5-25 MCG/INH AEPB Inhale 2 puffs into the lungs daily as needed (wheezing).    ARIPiprazole (ABILIFY) 2 MG tablet Take 2 mg by mouth daily.   aspirin EC 81 MG tablet Take 81 mg by mouth daily. Swallow whole.   buPROPion (WELLBUTRIN XL) 150 MG 24 hr tablet Take 150 mg by mouth daily.   clopidogrel (PLAVIX) 75 MG tablet  Take 1 tablet (75 mg total) by mouth daily.   desvenlafaxine (PRISTIQ) 100 MG 24 hr tablet Take 100 mg by mouth daily.   Estradiol 10 MCG TABS vaginal tablet Place 10 mcg vaginally once a week.    lisinopril (ZESTRIL) 10 MG tablet Take 10 mg by mouth daily.   methocarbamol (ROBAXIN) 500 MG tablet Take 1 tablet (500 mg total) by mouth every 6 (six) hours as needed for muscle spasms.   Multiple Vitamin (MULTIVITAMIN WITH MINERALS) TABS tablet Take 1 tablet by mouth daily. One-A-Day [place]   oxyCODONE (OXY IR/ROXICODONE) 5 MG immediate release tablet Take 1-2 tablets (5-10 mg total) by mouth every 6 (six) hours as needed for moderate pain (pain score 4-6).   pantoprazole (PROTONIX) 40 MG tablet Take 40 mg by mouth every morning.   prazosin (MINIPRESS) 1 MG capsule Take 1 mg by mouth at bedtime.   REPATHA SURECLICK 546 MG/ML SOAJ INJECT CONTENTS OF 1 PEN SUBCUTANEOUSLY EVERY 14 DAYS.   traZODone (DESYREL) 50 MG tablet Take 50 mg by mouth at bedtime.   zolpidem (AMBIEN) 10 MG tablet Take 10 mg by mouth at bedtime.     Allergies  Allergen Reactions   Penicillins Swelling and Other (See Comments)    TONGUE SWELLING  PATIENT HAD A PCN REACTION WITH IMMEDIATE RASH, FACIAL/TONGUE/THROAT SWELLING, SOB, OR LIGHTHEADEDNESS WITH HYPOTENSION:  #  #  #  YES  #  #  #   Has patient had a PCN reaction causing severe rash involving mucus membranes or skin necrosis: No Has patient had a PCN reaction that required hospitalization: No Has patient had a PCN reaction occurring within the last 10 years: No    Statins     muscle pain   Lyrica [Pregabalin] Other (See Comments)    DRY MOUTH    SOCIAL HISTORY/FAMILY HISTORY   Reviewed in Epic:  Pertinent findings:  Social History   Tobacco Use   Smoking status: Heavy Smoker    Packs/day: 0.02    Years: 36.00    Pack years: 0.72    Types: Cigarettes    Last attempt to quit: 04/24/2017    Years since quitting: 4.4   Smokeless tobacco: Never  Vaping  Use   Vaping  Use: Former  Substance Use Topics   Alcohol use: Yes    Alcohol/week: 3.0 standard drinks    Types: 3 Glasses of wine per week   Drug use: No   Social History   Social History Narrative   Patient lives at home with her husband [name]. Patient has no children.    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    Patient has BA degree.    Smokes 1*-2 cigarettes /day, but not every day-- former heavy smoker (failed "quitting") -- now says she has truly "QUIT" - however now is been going back and forth between quitting and cheating and probably smokes anywhere from 0-3 cigarettes a day.    OBJCTIVE -PE, EKG, labs   Wt Readings from Last 3 Encounters:  09/22/21 122 lb 9.6 oz (55.6 kg)  09/02/21 125 lb 6.4 oz (56.9 kg)  06/23/21 126 lb 8 oz (57.4 kg)    Physical Exam: BP 134/88   Pulse (!) 50   Resp (!) 95   Ht 5\' 5"  (1.651 m)   Wt 122 lb 9.6 oz (55.6 kg)   BMI 20.40 kg/m  Physical Exam Constitutional:      General: She is not in acute distress.    Appearance: Normal appearance. She is normal weight. She is not ill-appearing (Relatively healthy-appearing.  Has the appearance of a long-term smoker) or toxic-appearing.     Comments: Well-groomed.  Still smells of cigarette smoke although she says that she has quit  HENT:     Head: Normocephalic and atraumatic.  Neck:     Vascular: No carotid bruit.  Cardiovascular:     Rate and Rhythm: Normal rate and regular rhythm.     Chest Wall: PMI is not displaced.     Pulses: Normal pulses.     Heart sounds: Normal heart sounds, S1 normal and S2 normal. No murmur heard.   No friction rub. No gallop.  Pulmonary:     Effort: Pulmonary effort is normal. No respiratory distress.     Breath sounds: Normal breath sounds. No wheezing, rhonchi (No true rhonchi, but mild diffuse basal crackles without rales.) or rales.  Musculoskeletal:        General: No swelling. Normal range of motion.     Cervical back: Normal  range of motion and neck supple.  Skin:    General: Skin is warm and dry.     Findings: No rash.  Neurological:     General: No focal deficit present.     Mental Status: She is alert and oriented to person, place, and time.     Cranial Nerves: No cranial nerve deficit.     Gait: Gait (Somewhat slow gait.) normal.  Psychiatric:        Mood and Affect: Mood normal.        Behavior: Behavior normal.        Judgment: Judgment normal.     Comments: Relatively poor historian.     Adult ECG Report Not checked today  Recent Labs:   10/05/2020: TC 150, TG 121, HDL 73, LDL 55.;  K+ 4.2, TSH 0.47. Lab Results  Component Value Date   CREATININE 0.73 09/08/2021   BUN 7 (L) 09/08/2021   NA 138 09/08/2021   K 3.5 09/08/2021   CL 98 09/08/2021   CO2 22 09/08/2021   Lab Results  Component Value Date   WBC 10.7 (H) 06/23/2021   HGB 15.4 (H) 06/23/2021   HCT  43.5 06/23/2021   MCV 97.1 06/23/2021   PLT 322 06/23/2021     ==================================================  COVID-19 Education: The signs and symptoms of COVID-19 were discussed with the patient and how to seek care for testing (follow up with PCP or arrange E-visit).    I spent a total of 34  minutes with the patient spent in direct patient consultation.  Additional time spent with chart review  / charting (studies, outside notes, etc): 16 min Total Time: 50 min  Current medicines are reviewed at length with the patient today.  (+/- concerns) nn/a  This visit occurred during the SARS-CoV-2 public health emergency.  Safety protocols were in place, including screening questions prior to the visit, additional usage of staff PPE, and extensive cleaning of exam room while observing appropriate contact time as indicated for disinfecting solutions.  Notice: This dictation was prepared with Dragon dictation along with smaller phrase technology. Any transcriptional errors that result from this process are unintentional and may  not be corrected upon review.  Patient Instructions / Medication Changes & Studies & Tests Ordered   Patient Instructions  Medication Instructions:  No changes   *If you need a refill on your cardiac medications before your next appointment, please call your pharmacy*   Lab Work: CBC today   If you have labs (blood work) drawn today and your tests are completely normal, you will receive your results only by: MyChart Message (if you have MyChart) OR A paper copy in the mail If you have any lab test that is abnormal or we need to change your treatment, we will call you to review the results.   Testing/Procedures:  Will be schedule at [place] cath lab [place] has requested that you have a cardiac catheterization. Cardiac catheterization is used to diagnose and/or treat various heart conditions. Doctors may recommend this procedure for a number of different reasons. The most common reason is to evaluate chest pain. Chest pain can be a symptom of coronary artery disease (CAD), and cardiac catheterization can show whether plaque is narrowing or blocking your heart's arteries. This procedure is also used to evaluate the valves, as well as measure the blood flow and oxygen levels in different parts of your heart. For further information please visit [email or web address].[email or web address]. Please follow instruction sheet, as given.   Follow-Up: At [facility], you and your health needs are our priority.  As part of our continuing mission to provide you with exceptional heart care, we have created designated Provider Care Teams.  These Care Teams include your primary Cardiologist (physician) and Advanced Practice Providers (APPs -  Physician Assistants and Nurse Practitioners) who all work together to provide you with the care you need, when you need it.     Your next appointment:     Keep appointment on NOV 8 , 2022  The format for your next  appointment:   In Person  Provider:   Glenetta Hew, MD   Other Instructions    [place] [address] Dept: [phone] Loc: [phone]  BETHANNE MULE  09/22/2021  You are scheduled for a Cardiac Catheterization on Friday, October 7 with Dr. Glenetta Hew.  1. Please arrive at the [facility] (Main Entrance A) at [facility]: [address] [place], [place] [address] at 10:00 AM (This time is two hours before your procedure  to ensure your preparation). Free valet parking service is available.   Special note: Every effort is made to have your procedure done on time. Please understand that emergencies sometimes delay scheduled procedures.  2. Diet: Do not eat solid foods after midnight.  The patient may have clear liquids until 5am upon the day of the procedure.  3. Labs: You will need to have blood drawn on [facility] [address], [place]  Open: 8am - 5pm (Lunch 12:30 - 1:30)   Phone: [phone], September 29 at . You do not need to be fasting.  4. Medication instructions in preparation for your procedure:   Contrast Allergy: No   Stop taking, Lisinopril (Zestril or Prinivil) Friday, October 7,- ONLY  On the morning of your procedure, take your Aspirin 81 mg and Plavix/Clopidogrel 75 mg and any morning medicines NOT listed above.  You may use sips of water.  5. Plan for one night stay--bring personal belongings. 6. Bring a current list of your medications and current insurance cards. 7. You MUST have a responsible person to drive you home. 8. Someone MUST be with you the first 24 hours after you arrive home or your discharge will be delayed. 9. Please wear clothes that are easy to get on and off and wear slip-on shoes.  Thank you for allowing [place] to care for you!   -- [facility] services   Studies  Ordered:   Orders Placed This Encounter  Procedures   CBC   LEFT HEART CATHETERIZATION WITH CORONARY Illene Silver, M.D., M.S. Interventional Cardiologist   Pager # [phone] Phone # [phone] [address] [place],  [address]   Thank you for choosing Heartcare at [facility]!!

## 2021-09-22 NOTE — Assessment & Plan Note (Signed)
She is currently on Repatha and her lipids are well controlled based on last readings.  History of statin myopathy.

## 2021-09-22 NOTE — Assessment & Plan Note (Signed)
On amlodipine and lisinopril.  Not on beta-blocker because of bradycardia.  Blood pressure is borderline today.  We can reassess in the.  She does have room to increase lisinopril little bit, but has had some low pressures in the past.  Glenetta Hew, MD

## 2021-09-22 NOTE — Progress Notes (Signed)
Primary Care Provider: Michael Boston, MD Cardiologist: Glenetta Hew, MD Electrophysiologist: None  Clinic Note: Chief Complaint  Patient presents with   2 Week Follow-up    Test results   Shortness of Breath    Exertional.   Coronary Artery Disease    Positive CTA FFR     ===================================  ASSESSMENT/PLAN   Problem List Items Addressed This Visit       Cardiology Problems   Hyperlipidemia with target LDL less than 70 (Chronic)    She is currently on Repatha and her lipids are well controlled based on last readings.  History of statin myopathy.      Essential hypertension (Chronic)    On amlodipine and lisinopril.  Not on beta-blocker because of bradycardia.  Blood pressure is borderline today.  We can reassess in the.  She does have room to increase lisinopril little bit, but has had some low pressures in the past.  Glenetta Hew, MD       PAD (peripheral artery disease) ([place]) (Chronic)    Followed by vascular surgery.      Thoracic aortic ectasia (HCC) - Primary (Chronic)    CTA did not show any evidence of enlarged aorta.        Other   DOE (dyspnea on exertion)    She clearly is deconditioned and some of her dyspnea is related to that as well as her underlying lung disease, however with the symptoms of tightness in her chest and progressively worsening dyspnea, there is concern that it is potentially ischemic in nature.  CT FFR was positive for Lesion.  Plan: Proceed with cardiac catheterization and possible PCI.      Relevant Orders   CBC   LEFT HEART CATHETERIZATION WITH CORONARY ANGIOGRAM   Abnormal cardiac CT angiography    Cardiac CTA shows FFR positive lesion in the major OM branch.  With progressive exertional dyspnea, but this is potentially an anginal equivalent.  We discussed options of just dressing titration of medical management versus considering catheterization and PCI.  After a prolonged discussion, we  determined to proceed with cardiac catheterization and possible PCI of the LCx CX-LM. She is already on aspirin and Plavix as well as a statin. Not on beta-blocker because of bradycardia, but is on amlodipine 10 mg daily and daily.  Told to hold the lisinopril the morning of-take it at nighttime and take the amlodipine the morning of the procedure.  I explained the heart catheterization in detail, and explained all the risks, benefits and alternatives.  See consent information below.      Relevant Orders   CBC   LEFT HEART CATHETERIZATION WITH CORONARY ANGIOGRAM   ===================================  HPI:    Brenda Carney is a 70 y.o. female with a PMH Notable for Coronary Calcification -> Coronary Calcium Score > 1000, Nonischemic Nuclear Stress Test), PAD (history of R femoropopliteal bypass and right SFA PTA), HTN, HLD (on Repatha) & Centilobular Emphysema, Former Heavy Smoker (quit last yr)who presents today to discuss results of her Coronary CT Angiogram with positive CT FFR of the OM1.  January 08, 2020 for preop evaluation.  Very stable from a cardiac standpoint.  Major issue was hip pain limiting her walking.  Prior to that was able to do 6-8 METS without or dyspnea.  OUIDA ABEYTA was last seen on September 02, 2021 for roughly 23-month follow-up.  She noted that initially after her lower extremity angioplasty, she did much better further claudication symptoms.  She also had hip surgeries.  What she noted though was that over the last few months she has been getting much more short of breath with less activity than she had been in the past.  She did not necessarily note exertional chest pain but does have occasional tightness in the chest when she is trying to catch her breath.  She has baseline dyspnea from COPD, but the exertional dyspnea is Progressively worse.  Therefore chose to evaluate with a Cardiac CT Angiogram  Recent Hospitalizations: n/a  Reviewed  CV studies:     The following studies were reviewed today: (if available, images/films reviewed: From Epic Chart or Care Everywhere) 06/16/2020: Lower extremity arterial angiogram -> right SFA laser atherectomy and balloon angioplasty. Coronary CTA 09/09/2021: Calcium score 1613.  Moderate RCA, LAD disease.  FFR normal.  Significant (> 70%) mid OM disease (FFR abnormal 0.75).  Severe mitral annular calcification and diffuse aortic atherosclerosis. (LAD 507, LCx 268, RCA 838)  Interval History:   Brenda Carney presents to discuss results of her study.  She seems to be a relatively poor historian not quite understanding the reasoning behind what was ordered and what it entailed.  This is despite the fact that I went and did quite a bit of detail explaining the Coronary CTA to her.  She still has exertional dyspnea, but cannot remember if she mentioned in the MI chest pain or not.  She does not think she has chest pain per se but acknowledges that when she is short of breath there is some tightness in her chest.  No resting dyspnea and besides her baseline COPD.  No prolonged irregular heartbeats or palpitations, just simply a few skipped beats here and there.  CV Review of Symptoms (Summary) Cardiovascular ROS: positive for - dyspnea on exertion, shortness of breath, and some chest tightness associated with dyspnea that resolved at rest.Occasional palpitations.,  But nothing prolonged.  Bilateral foot/lower leg claudication negative for - edema, irregular heartbeat, orthopnea, palpitations, paroxysmal nocturnal dyspnea, rapid heart rate, or syncope/near syncope or TIA/amaurosis fugax, claudication  REVIEWED OF SYSTEMS   Review of Systems  Constitutional:  Positive for malaise/fatigue (She does acknowledge deconditioning with exercise intolerance and appeared quite out of shape.) and weight loss (She actually was initially trying to lose weight, but now just trying to stabilize.  She has not notably lost weight  since 2018.Marland Kitchen).  HENT:  Positive for hearing loss and tinnitus (Mild). Negative for congestion.   Respiratory:  Positive for cough (Morning cough is now left productive.  Less frequent.) and shortness of breath (Exertional dyspnea Per HPI.  Otherwise has baseline dyspnea.). Negative for sputum production.   Cardiovascular:  Positive for claudication. Negative for leg swelling.       Per HPI  Gastrointestinal:  Negative for constipation.  Genitourinary:  Negative for hematuria and urgency.  Musculoskeletal:  Negative for back pain and myalgias.  Neurological:  Positive for dizziness and headaches (Off and on.). Negative for focal weakness.  Psychiatric/Behavioral:  Negative for depression and memory loss. The patient is nervous/anxious. The patient does not have insomnia.     I have reviewed and (if needed) personally updated the patient's problem list, medications, allergies, past medical and surgical history, social and family history.   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   Cervical spondylosis    Chronic lower back pain    COPD (chronic obstructive pulmonary disease) (HCC)    "little bit" (05/14/2017)   Coronary artery disease, non-occlusive    Coronary calcium scoring: June 2018: Score 1106. 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   Pneumonia 2020   Sleep apnea    does not wear CPAP; "think it was medication related" (05/14/2017)   Stroke [facility]) 2010   TIA   Thoracic aortic ectasia (HCC)    ~40 mm on Coronary Calcium Score CT - recommend f/u CTA or MRA   TIA (transient ischemic attack) 09/2011   "several"    PAST SURGICAL HISTORY   Past Surgical History:  Procedure Laterality Date   ABDOMINAL AORTOGRAM W/LOWER EXTREMITY N/A 05/14/2017   Procedure: Abdominal Aortogram w/Lower Extremity;  Surgeon: Conrad Warsaw, MD;  Location: [facility];  Service: Cardiovascular;  Laterality: N/A;   ACROMIO-CLAVICULAR JOINT REPAIR Left 1990s   "I think it was called an [facility] joint removal"   BACK SURGERY     BYPASS GRAFT POPLITEAL TO POPLITEAL Right 05/15/2017   Procedure: BYPASS GRAFT ABOVE KNEE POPLITEAL TO BELOW KNEE POPLITEAL ARTERY;  Surgeon: Conrad Sattley, MD;  Location: [facility] OR;  Service: Vascular;  Laterality: Right;   ESOPHAGOGASTRODUODENOSCOPY (EGD) WITH PROPOFOL N/A 04/29/2018   Procedure: ESOPHAGOGASTRODUODENOSCOPY (EGD) WITH PROPOFOL;  Surgeon: Otis Brace, MD;  Location: [facility];  Service: Gastroenterology;  Laterality: N/A;   FOOT FRACTURE SURGERY Bilateral    multiple procedures   FRACTURE SURGERY     JOINT REPLACEMENT     LOWER EXTREMITY ANGIOGRAPHY N/A 06/16/2020   Procedure: LOWER EXTREMITY ANGIOGRAPHY;  Surgeon: [name], MD;  Location: [place] [facility];  Service: Cardiovascular;  Laterality: N/A;   MAXIMUM ACCESS (MAS)POSTERIOR LUMBAR INTERBODY FUSION (PLIF) 1 LEVEL N/A 09/30/2014   Procedure: FOR MAXIMUM ACCESS (MAS) POSTERIOR LUMBAR INTERBODY FUSION (PLIF) 1 LEVEL;  Surgeon: Eustace Moore, MD;  Location: [facility] [facility];  Service: Neurosurgery;  Laterality: N/A;  FOR MAXIMUM  ACCESS (MAS) POSTERIOR LUMBAR INTERBODY FUSION (PLIF) 1 LEVEL LUMBAR 4-5   MULTIPLE TOOTH EXTRACTIONS Right 04/2017   NM MYOVIEW LTD  06/2017    Normal EF, 66%. No ST changes. Normal study. LOW RISK.   PERIPHERAL VASCULAR ATHERECTOMY Left 06/16/2020   Procedure: PERIPHERAL VASCULAR ATHERECTOMY;  Surgeon: [name], MD;  Location: [place] [facility];  Service: Cardiovascular;  Laterality: Left;  SFA   TOTAL HIP ARTHROPLASTY Right 01/16/2020   Procedure: RIGHT TOTAL HIP ARTHROPLASTY ANTERIOR APPROACH;  Surgeon: Mcarthur Rossetti, MD;  Location: [facility];  Service: Orthopedics;  Laterality: Right;   TOTAL HIP ARTHROPLASTY Left 04/30/2020   Procedure: LEFT TOTAL HIP ARTHROPLASTY ANTERIOR APPROACH;  Surgeon:  Mcarthur Rossetti, MD;  Location: [facility];  Service: Orthopedics;  Laterality: Left;   TRANSTHORACIC ECHOCARDIOGRAM  09/07/2013   Done to evaluate TIA: Normal LV size and function. EF 55-60%. GR 1 DD. Mild left atrial dilation. Mildly calcified mitral leaflets. Otherwise normal.   5/22/021: Right AK Pop- BK Pop bypass with ipsilateral Non-reversed SVG, also Vein patch of BK Pop A for a post-procedure wound.  Stopped Smoking in 2020  Immunization History  Administered Date(s) Administered   PFIZER(Purple Top)SARS-COV-2 Vaccination 01/30/2020, 02/24/2020    MEDICATIONS/ALLERGIES   Current Meds  Medication Sig   albuterol (VENTOLIN HFA) 108 (90 Base) MCG/ACT inhaler Inhale 2 puffs into the lungs every 4 (four) hours as needed for wheezing or shortness of breath.   amLODipine (NORVASC) 10 MG tablet Take 1 tablet (10 mg total) by mouth daily.   ANORO ELLIPTA 62.5-25 MCG/INH AEPB Inhale 2 puffs into the lungs daily as needed (wheezing).    ARIPiprazole (ABILIFY) 2 MG tablet Take 2 mg by mouth daily.   aspirin EC 81 MG tablet Take 81 mg by mouth daily. Swallow whole.   buPROPion (WELLBUTRIN XL) 150 MG 24 hr tablet Take 150 mg by mouth daily.   clopidogrel (PLAVIX) 75 MG tablet  Take 1 tablet (75 mg total) by mouth daily.   desvenlafaxine (PRISTIQ) 100 MG 24 hr tablet Take 100 mg by mouth daily.   Estradiol 10 MCG TABS vaginal tablet Place 10 mcg vaginally once a week.    lisinopril (ZESTRIL) 10 MG tablet Take 10 mg by mouth daily.   methocarbamol (ROBAXIN) 500 MG tablet Take 1 tablet (500 mg total) by mouth every 6 (six) hours as needed for muscle spasms.   Multiple Vitamin (MULTIVITAMIN WITH MINERALS) TABS tablet Take 1 tablet by mouth daily. One-A-Day [place]   oxyCODONE (OXY IR/ROXICODONE) 5 MG immediate release tablet Take 1-2 tablets (5-10 mg total) by mouth every 6 (six) hours as needed for moderate pain (pain score 4-6).   pantoprazole (PROTONIX) 40 MG tablet Take 40 mg by mouth every morning.   prazosin (MINIPRESS) 1 MG capsule Take 1 mg by mouth at bedtime.   REPATHA SURECLICK 007 MG/ML SOAJ INJECT CONTENTS OF 1 PEN SUBCUTANEOUSLY EVERY 14 DAYS.   traZODone (DESYREL) 50 MG tablet Take 50 mg by mouth at bedtime.   zolpidem (AMBIEN) 10 MG tablet Take 10 mg by mouth at bedtime.     Allergies  Allergen Reactions   Penicillins Swelling and Other (See Comments)    TONGUE SWELLING  PATIENT HAD A PCN REACTION WITH IMMEDIATE RASH, FACIAL/TONGUE/THROAT SWELLING, SOB, OR LIGHTHEADEDNESS WITH HYPOTENSION:  #  #  #  YES  #  #  #   Has patient had a PCN reaction causing severe rash involving mucus membranes or skin necrosis: No Has patient had a PCN reaction that required hospitalization: No Has patient had a PCN reaction occurring within the last 10 years: No    Statins     muscle pain   Lyrica [Pregabalin] Other (See Comments)    DRY MOUTH    SOCIAL HISTORY/FAMILY HISTORY   Reviewed in Epic:  Pertinent findings:  Social History   Tobacco Use   Smoking status: Heavy Smoker    Packs/day: 0.02    Years: 36.00    Pack years: 0.72    Types: Cigarettes    Last attempt to quit: 04/24/2017    Years since quitting: 4.4   Smokeless tobacco: Never  Vaping  Use   Vaping  Use: Former  Substance Use Topics   Alcohol use: Yes    Alcohol/week: 3.0 standard drinks    Types: 3 Glasses of wine per week   Drug use: No   Social History   Social History Narrative   Patient lives at home with her husband [name]. Patient has no children.    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    Patient has BA degree.    Smokes 1*-2 cigarettes /day, but not every day-- former heavy smoker (failed "quitting") -- now says she has truly "QUIT" - however now is been going back and forth between quitting and cheating and probably smokes anywhere from 0-3 cigarettes a day.    OBJCTIVE -PE, EKG, labs   Wt Readings from Last 3 Encounters:  09/22/21 122 lb 9.6 oz (55.6 kg)  09/02/21 125 lb 6.4 oz (56.9 kg)  06/23/21 126 lb 8 oz (57.4 kg)    Physical Exam: BP 134/88   Pulse (!) 50   Resp (!) 95   Ht 5\' 5"  (1.651 m)   Wt 122 lb 9.6 oz (55.6 kg)   BMI 20.40 kg/m  Physical Exam Constitutional:      General: She is not in acute distress.    Appearance: Normal appearance. She is normal weight. She is not ill-appearing (Relatively healthy-appearing.  Has the appearance of a long-term smoker) or toxic-appearing.     Comments: Well-groomed.  Still smells of cigarette smoke although she says that she has quit  HENT:     Head: Normocephalic and atraumatic.  Neck:     Vascular: No carotid bruit.  Cardiovascular:     Rate and Rhythm: Normal rate and regular rhythm.     Chest Wall: PMI is not displaced.     Pulses: Normal pulses.     Heart sounds: Normal heart sounds, S1 normal and S2 normal. No murmur heard.   No friction rub. No gallop.  Pulmonary:     Effort: Pulmonary effort is normal. No respiratory distress.     Breath sounds: Normal breath sounds. No wheezing, rhonchi (No true rhonchi, but mild diffuse basal crackles without rales.) or rales.  Musculoskeletal:        General: No swelling. Normal range of motion.     Cervical back: Normal  range of motion and neck supple.  Skin:    General: Skin is warm and dry.     Findings: No rash.  Neurological:     General: No focal deficit present.     Mental Status: She is alert and oriented to person, place, and time.     Cranial Nerves: No cranial nerve deficit.     Gait: Gait (Somewhat slow gait.) normal.  Psychiatric:        Mood and Affect: Mood normal.        Behavior: Behavior normal.        Judgment: Judgment normal.     Comments: Relatively poor historian.     Adult ECG Report Not checked today  Recent Labs:   10/05/2020: TC 150, TG 121, HDL 73, LDL 55.;  K+ 4.2, TSH 0.47. Lab Results  Component Value Date   CREATININE 0.73 09/08/2021   BUN 7 (L) 09/08/2021   NA 138 09/08/2021   K 3.5 09/08/2021   CL 98 09/08/2021   CO2 22 09/08/2021   Lab Results  Component Value Date   WBC 10.7 (H) 06/23/2021   HGB 15.4 (H) 06/23/2021   HCT  43.5 06/23/2021   MCV 97.1 06/23/2021   PLT 322 06/23/2021     ==================================================  COVID-19 Education: The signs and symptoms of COVID-19 were discussed with the patient and how to seek care for testing (follow up with PCP or arrange E-visit).    I spent a total of 34  minutes with the patient spent in direct patient consultation.  Additional time spent with chart review  / charting (studies, outside notes, etc): 16 min Total Time: 50 min  Current medicines are reviewed at length with the patient today.  (+/- concerns) nn/a  This visit occurred during the SARS-CoV-2 public health emergency.  Safety protocols were in place, including screening questions prior to the visit, additional usage of staff PPE, and extensive cleaning of exam room while observing appropriate contact time as indicated for disinfecting solutions.  Notice: This dictation was prepared with Dragon dictation along with smaller phrase technology. Any transcriptional errors that result from this process are unintentional and may  not be corrected upon review.  Patient Instructions / Medication Changes & Studies & Tests Ordered   Patient Instructions  Medication Instructions:  No changes   *If you need a refill on your cardiac medications before your next appointment, please call your pharmacy*   Lab Work: CBC today   If you have labs (blood work) drawn today and your tests are completely normal, you will receive your results only by: MyChart Message (if you have MyChart) OR A paper copy in the mail If you have any lab test that is abnormal or we need to change your treatment, we will call you to review the results.   Testing/Procedures:  Will be schedule at [facility] cath lab [place] has requested that you have a cardiac catheterization. Cardiac catheterization is used to diagnose and/or treat various heart conditions. Doctors may recommend this procedure for a number of different reasons. The most common reason is to evaluate chest pain. Chest pain can be a symptom of coronary artery disease (CAD), and cardiac catheterization can show whether plaque is narrowing or blocking your heart's arteries. This procedure is also used to evaluate the valves, as well as measure the blood flow and oxygen levels in different parts of your heart. For further information please visit [email or web address].[email or web address]. Please follow instruction sheet, as given.   Follow-Up: At [facility], you and your health needs are our priority.  As part of our continuing mission to provide you with exceptional heart care, we have created designated Provider Care Teams.  These Care Teams include your primary Cardiologist (physician) and Advanced Practice Providers (APPs -  Physician Assistants and Nurse Practitioners) who all work together to provide you with the care you need, when you need it.     Your next appointment:     Keep appointment on NOV 8 , 2022  The format for your next  appointment:   In Person  Provider:   Glenetta Hew, MD   Other Instructions    [place] [address] [place] [address] Dept: [phone] Loc: [phone]  CHOSEN GARRON  09/22/2021  You are scheduled for a Cardiac Catheterization on Friday, October 7 with Dr. Glenetta Hew.  1. Please arrive at the [facility] (Main Entrance A) at [facility]: [address] [place], [place] [address] at 10:00 AM (This time is two hours before your procedure  to ensure your preparation). Free valet parking service is available.   Special note: Every effort is made to have your procedure done on time. Please understand that emergencies sometimes delay scheduled procedures.  2. Diet: Do not eat solid foods after midnight.  The patient may have clear liquids until 5am upon the day of the procedure.  3. Labs: You will need to have blood drawn on [facility] [address], [place]  Open: 8am - 5pm (Lunch 12:30 - 1:30)   Phone: [phone], September 29 at . You do not need to be fasting.  4. Medication instructions in preparation for your procedure:   Contrast Allergy: No   Stop taking, Lisinopril (Zestril or Prinivil) Friday, October 7,- ONLY  On the morning of your procedure, take your Aspirin 81 mg and Plavix/Clopidogrel 75 mg and any morning medicines NOT listed above.  You may use sips of water.  5. Plan for one night stay--bring personal belongings. 6. Bring a current list of your medications and current insurance cards. 7. You MUST have a responsible person to drive you home. 8. Someone MUST be with you the first 24 hours after you arrive home or your discharge will be delayed. 9. Please wear clothes that are easy to get on and off and wear slip-on shoes.  Thank you for allowing [place] to care for you!   -- [facility]   Studies  Ordered:   Orders Placed This Encounter  Procedures   CBC   LEFT HEART CATHETERIZATION WITH CORONARY Illene Silver, M.D., M.S. Interventional Cardiologist   Pager # [phone] Phone # [phone] [address] [place], [place] [address]   Thank you for choosing Heartcare at [facility]!!

## 2021-09-22 NOTE — Progress Notes (Signed)
Orders placed for left heart cath.

## 2021-09-22 NOTE — Assessment & Plan Note (Signed)
Followed by vascular surgery. 

## 2021-09-27 ENCOUNTER — Ambulatory Visit (INDEPENDENT_AMBULATORY_CARE_PROVIDER_SITE_OTHER)
Admission: RE | Admit: 2021-09-27 | Discharge: 2021-09-27 | Disposition: A | Discharge status: Home / Self Care | Payer: Medicare Other | Source: Ambulatory Visit | Attending: Vascular Surgery | Admitting: Vascular Surgery

## 2021-09-27 ENCOUNTER — Ambulatory Visit (INDEPENDENT_AMBULATORY_CARE_PROVIDER_SITE_OTHER): Payer: Medicare Other | Admitting: Vascular Surgery

## 2021-09-27 ENCOUNTER — Encounter: Payer: Self-pay | Admitting: Vascular Surgery

## 2021-09-27 ENCOUNTER — Other Ambulatory Visit: Payer: Self-pay

## 2021-09-27 ENCOUNTER — Ambulatory Visit (HOSPITAL_COMMUNITY)
Admission: RE | Admit: 2021-09-27 | Discharge: 2021-09-27 | Disposition: A | Discharge status: Home / Self Care | Payer: Medicare Other | Source: Ambulatory Visit | Attending: Vascular Surgery | Admitting: Vascular Surgery

## 2021-09-27 VITALS — BP 139/87 | HR 74 | Temp 98.5°F | Resp 20 | Ht 65.0 in | Wt 122.0 lb

## 2021-09-27 DIAGNOSIS — I70229 Atherosclerosis of native arteries of extremities with rest pain, unspecified extremity: Secondary | ICD-10-CM | POA: Diagnosis not present

## 2021-09-27 DIAGNOSIS — I739 Peripheral vascular disease, unspecified: Secondary | ICD-10-CM | POA: Insufficient documentation

## 2021-09-27 NOTE — Progress Notes (Signed)
Patient name: Brenda Carney MRN: [id number] DOB: [birth year] Sex: female  REASON FOR VISIT: 63-month follow-up for surveillance of right leg bypass and previous SFA intervention  HPI: Brenda Carney is a 70 y.o. female with multiple medical problems as noted below who presents for 24-month follow-up of her right leg bypass and most recent right SFA intervention.  She most recently underwent right SFA laser atherectomy with balloon angioplasty including drug-coated balloon on 06/16/2020.  This is for recurrent claudication in the setting of previous right above-knee to below-knee pop bypass with vein by Dr. Bridgett Larsson in 2018.  On arteriogram she was found to have an occluded SFA proximal to the bypass and the bypass remained patent.    She reports no significant issues today.  Not having any recurrent leg claudication.  She stopped smoking last year.  Taking her aspirin Plavix.  Past Medical History:  Diagnosis Date   Arthritis    "thumbs; hips" (05/14/2017)   Cerebral aneurysm    Right ophthalmic artery, left callosal marginal anterior cerebral artery branch   Cerebrovascular disease    Carotid dopplers which showed no significant increase in velocities, extremely minimal on the left, antegrade vertebral bilaterally.   Cervical spondylosis    Chronic lower back pain    COPD (chronic obstructive pulmonary disease) (HCC)    "little bit" (05/14/2017)   Coronary artery disease, non-occlusive    Coronary calcium scoring: June 2018: Score 1106. 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 Currently on Livalo 4 mg   Hypertension, benign    Lumbosacral spondylosis    PAD (peripheral  artery disease) (HCC)    Status post right above-the-knee-below the knee popliteal artery bypass; postop right ABI 0.96.   Pneumonia 2020   Sleep apnea    does not wear CPAP; "think it was medication related" (05/14/2017)   Stroke [facility]) 2010   TIA   Thoracic aortic ectasia (HCC)    ~40 mm on Coronary Calcium Score CT - recommend f/u CTA or MRA   TIA (transient ischemic attack) 09/2011   "several"    Past Surgical History:  Procedure Laterality Date   ABDOMINAL AORTOGRAM W/LOWER EXTREMITY N/A 05/14/2017   Procedure: Abdominal Aortogram w/Lower Extremity;  Surgeon: Conrad Stover, MD;  Location: [place] [facility];  Service: Cardiovascular;  Laterality: N/A;   ACROMIO-CLAVICULAR JOINT REPAIR Left 1990s   "I think it was called an [facility] joint removal"   BACK SURGERY     BYPASS GRAFT POPLITEAL TO POPLITEAL Right 05/15/2017   Procedure: BYPASS GRAFT ABOVE KNEE POPLITEAL TO BELOW KNEE POPLITEAL ARTERY;  Surgeon: Conrad Fairbury, MD;  Location: [facility] OR;  Service: Vascular;  Laterality: Right;   ESOPHAGOGASTRODUODENOSCOPY (EGD) WITH PROPOFOL N/A 04/29/2018   Procedure: ESOPHAGOGASTRODUODENOSCOPY (EGD) WITH PROPOFOL;  Surgeon: Otis Brace, MD;  Location: [place];  Service: Gastroenterology;  Laterality: N/A;   FOOT FRACTURE SURGERY Bilateral    multiple procedures   FRACTURE SURGERY     JOINT REPLACEMENT     LOWER EXTREMITY ANGIOGRAPHY N/A 06/16/2020   Procedure: LOWER EXTREMITY ANGIOGRAPHY;  Surgeon: [name], MD;  Location:  [place] INVASIVE CV LAB;  Service: Cardiovascular;  Laterality: N/A;   MAXIMUM ACCESS (MAS)POSTERIOR LUMBAR INTERBODY FUSION (PLIF) 1 LEVEL N/A 09/30/2014   Procedure: FOR MAXIMUM ACCESS (MAS) POSTERIOR LUMBAR INTERBODY FUSION (PLIF) 1 LEVEL;  Surgeon: Eustace Moore, MD;  Location: [place] [facility];  Service: Neurosurgery;  Laterality: N/A;  FOR MAXIMUM ACCESS (MAS) POSTERIOR LUMBAR INTERBODY FUSION (PLIF) 1 LEVEL LUMBAR 4-5   MULTIPLE TOOTH EXTRACTIONS Right 04/2017   NM  MYOVIEW LTD  06/2017    Normal EF, 66%. No ST changes. Normal study. LOW RISK.   PERIPHERAL VASCULAR ATHERECTOMY Left 06/16/2020   Procedure: PERIPHERAL VASCULAR ATHERECTOMY;  Surgeon: [name], MD;  Location: [place] [facility];  Service: Cardiovascular;  Laterality: Left;  SFA   TOTAL HIP ARTHROPLASTY Right 01/16/2020   Procedure: RIGHT TOTAL HIP ARTHROPLASTY ANTERIOR APPROACH;  Surgeon: Mcarthur Rossetti, MD;  Location: [facility];  Service: Orthopedics;  Laterality: Right;   TOTAL HIP ARTHROPLASTY Left 04/30/2020   Procedure: LEFT TOTAL HIP ARTHROPLASTY ANTERIOR APPROACH;  Surgeon: Mcarthur Rossetti, MD;  Location: [facility];  Service: Orthopedics;  Laterality: Left;   TRANSTHORACIC ECHOCARDIOGRAM  09/07/2013   Done to evaluate TIA: Normal LV size and function. EF 55-60%. GR 1 DD. Mild left atrial dilation. Mildly calcified mitral leaflets. Otherwise normal.    Family History  Problem Relation Age of Onset   Stroke Father    Heart attack Mother    Stroke Mother     SOCIAL HISTORY: Social History   Tobacco Use   Smoking status: Former    Packs/day: 0.02    Years: 36.00    Pack years: 0.72    Types: Cigarettes    Quit date: 04/24/2017    Years since quitting: 4.4   Smokeless tobacco: Never  Substance Use Topics   Alcohol use: Yes    Alcohol/week: 3.0 standard drinks    Types: 3 Glasses of wine per week    Allergies  Allergen Reactions   Penicillins Swelling and Other (See Comments)    TONGUE SWELLING  PATIENT HAD A PCN REACTION WITH IMMEDIATE RASH, FACIAL/TONGUE/THROAT SWELLING, SOB, OR LIGHTHEADEDNESS WITH HYPOTENSION:  #  #  #  YES  #  #  #   Has patient had a PCN reaction causing severe rash involving mucus membranes or skin necrosis: No Has patient had a PCN reaction that required hospitalization: No Has patient had a PCN reaction occurring within the last 10 years: No    Statins     muscle pain   Lyrica [Pregabalin] Other (See Comments)    DRY MOUTH     Current Outpatient Medications  Medication Sig Dispense Refill   albuterol (VENTOLIN HFA) 108 (90 Base) MCG/ACT inhaler Inhale 2 puffs into the lungs every 4 (four) hours as needed for wheezing or shortness of breath.     amLODipine (NORVASC) 10 MG tablet Take 1 tablet (10 mg total) by mouth daily. 30 tablet 0   ANORO ELLIPTA 62.5-25 MCG/INH AEPB Inhale 2 puffs into the lungs daily as needed (wheezing).      ARIPiprazole (ABILIFY) 2 MG tablet Take 2 mg by mouth daily.     aspirin EC 81 MG tablet Take 81 mg by mouth daily. Swallow whole.     buPROPion (WELLBUTRIN XL) 150 MG 24 hr tablet Take 150 mg by mouth daily.  1   Calcium Carbonate-Vitamin D (CALCIUM-VITAMIN D3 PO) Take 1 tablet by mouth daily. 1200 mg / 5000 units  Carboxymethylcellulose Sodium (THERATEARS) 0.25 % SOLN Place 1 drop into both eyes 4 (four) times daily as needed (dry eye).     clopidogrel (PLAVIX) 75 MG tablet Take 1 tablet (75 mg total) by mouth daily. 90 tablet 0   desvenlafaxine (PRISTIQ) 100 MG 24 hr tablet Take 100 mg by mouth daily.     Estradiol 10 MCG TABS vaginal tablet Place 10 mcg vaginally once a week.      lisinopril (ZESTRIL) 10 MG tablet Take 10 mg by mouth daily.     Multiple Vitamin (MULTIVITAMIN WITH MINERALS) TABS tablet Take 1 tablet by mouth daily. One-A-Day [place]     naphazoline-pheniramine (VISINE) 0.025-0.3 % ophthalmic solution Place 1 drop into both eyes 4 (four) times daily as needed (Dry eye).     oxyCODONE (OXY IR/ROXICODONE) 5 MG immediate release tablet Take 1-2 tablets (5-10 mg total) by mouth every 6 (six) hours as needed for moderate pain (pain score 4-6). (Patient taking differently: Take 10 mg by mouth in the morning, at noon, in the evening, and at bedtime.) 30 tablet 0   pantoprazole (PROTONIX) 40 MG tablet Take 40 mg by mouth every morning.     Propylene Glycol (SYSTANE COMPLETE) 0.6 % SOLN Place 1 drop into both eyes 4 (four) times daily as needed (Dry eye).     REPATHA  SURECLICK 893 MG/ML SOAJ INJECT CONTENTS OF 1 PEN SUBCUTANEOUSLY EVERY 14 DAYS. 2 mL 11   traZODone (DESYREL) 50 MG tablet Take 50 mg by mouth at bedtime.     zolpidem (AMBIEN) 10 MG tablet Take 10 mg by mouth at bedtime.      No current facility-administered medications for this visit.    REVIEW OF SYSTEMS:  [X]  denotes positive finding, [ ]  denotes negative finding Cardiac  Comments:  Chest pain or chest pressure:    Shortness of breath upon exertion:    Short of breath when lying flat:    Irregular heart rhythm:        Vascular    Pain in calf, thigh, or hip brought on by ambulation:    Pain in feet at night that wakes you up from your sleep:     Blood clot in your veins:    Leg swelling:         Pulmonary    Oxygen at home:    Productive cough:     Wheezing:         Neurologic    Sudden weakness in arms or legs:     Sudden numbness in arms or legs:     Sudden onset of difficulty speaking or slurred speech:    Temporary loss of vision in one eye:     Problems with dizziness:         Gastrointestinal    Blood in stool:     Vomited blood:         Genitourinary    Burning when urinating:     Blood in urine:        Psychiatric    Major depression:         Hematologic    Bleeding problems:    Problems with blood clotting too easily:        Skin    Rashes or ulcers:        Constitutional    Fever or chills:      PHYSICAL EXAM: Vitals:   09/27/21 1206  BP: 139/87  Pulse: 74  Resp: 20  Temp:  98.5 F (36.9 C)  SpO2: 95%  Weight: 122 lb (55.3 kg)  Height: 5\' 5"  (1.651 m)    GENERAL: The patient is a well-nourished female, in no acute distress. The vital signs are documented above. CARDIAC: There is a regular rate and rhythm.  VASCULAR:  Bilateral femoral pulses palpable Bilateral PT palpable No lower extremity tissue loss   DATA:   ABIs today are 0.91 on the right biphasic and 0.96 on the left biphasic  Right lower extremity arterial duplex  shows a patent SFA and patent popliteal bypass.  There is an area of irregular calcified plaque in the SFA with question of near occlusion  Assessment/Plan:  70 year old female who underwent right SFA laser atherectomy with balloon angioplasty including drug-coated balloon on 06/16/2020 for SFA occlusion proximal to above-knee to below-knee popliteal artery bypass with vein done by Dr. Bridgett Larsson in 2018.   She had a nice result last year and is having no recurrent symptoms at this time.  I discussed her duplex findings today with a question of recurrent SFA plaque proximal to her bypass where we previously performed intervention.  She is having no significant right leg symptoms.  She has a palpable PT pulse at the ankle.  Her ABIs are fairly well-preserved at 0.91.  We agreed on short interval surveillance and I will see her again in 3 months with a right leg arterial duplex and ABIs.  We will delay further intervention in the Cath Lab for now.   [name], MD [facility] Office: [phone]

## 2021-09-28 ENCOUNTER — Other Ambulatory Visit: Payer: Self-pay

## 2021-09-28 DIAGNOSIS — I739 Peripheral vascular disease, unspecified: Secondary | ICD-10-CM

## 2021-09-29 ENCOUNTER — Telehealth: Payer: Self-pay | Admitting: *Deleted

## 2021-09-29 NOTE — Telephone Encounter (Signed)
Cardiac catheterization scheduled at [facility] for: Friday September 30, 2021 [facility] Main Entrance A [facility]) at: 10 AM   No solid food after midnight prior to cath, clear liquids until 5 AM day of procedure.   Usual morning medications can be taken pre-cath with sips of water including: Plavix 75 mg  Aspirin 81 mg    Confirmed patient has responsible adult to drive home post procedure and be with patient first 24 hours after arriving home.  [facility] does allow one visitor to accompany you and wait in the hospital waiting room while you are there for your procedure. You and your visitor will be asked to wear a mask once you enter the hospital.   Patient reports does not currently have any symptoms concerning for COVID-19 and no household members with COVID-19 like illness.      Reviewed procedure/mask/visitor instructions with patient.

## 2021-09-30 ENCOUNTER — Ambulatory Visit (HOSPITAL_COMMUNITY)
Admission: RE | Admit: 2021-09-30 | Discharge: 2021-09-30 | Disposition: A | Discharge status: Home / Self Care | Payer: Medicare Other | Attending: Cardiology | Admitting: Cardiology

## 2021-09-30 ENCOUNTER — Encounter (HOSPITAL_COMMUNITY)
Admission: RE | Disposition: A | Discharge status: Home / Self Care | Payer: Self-pay | Source: Home / Self Care | Attending: Cardiology

## 2021-09-30 ENCOUNTER — Other Ambulatory Visit: Payer: Self-pay

## 2021-09-30 DIAGNOSIS — I2584 Coronary atherosclerosis due to calcified coronary lesion: Secondary | ICD-10-CM | POA: Insufficient documentation

## 2021-09-30 DIAGNOSIS — I77810 Thoracic aortic ectasia: Secondary | ICD-10-CM | POA: Insufficient documentation

## 2021-09-30 DIAGNOSIS — E785 Hyperlipidemia, unspecified: Secondary | ICD-10-CM | POA: Diagnosis not present

## 2021-09-30 DIAGNOSIS — Z28311 Partially vaccinated for covid-19: Secondary | ICD-10-CM | POA: Insufficient documentation

## 2021-09-30 DIAGNOSIS — Z880 Allergy status to penicillin: Secondary | ICD-10-CM | POA: Diagnosis not present

## 2021-09-30 DIAGNOSIS — Z8673 Personal history of transient ischemic attack (TIA), and cerebral infarction without residual deficits: Secondary | ICD-10-CM | POA: Diagnosis not present

## 2021-09-30 DIAGNOSIS — Z7902 Long term (current) use of antithrombotics/antiplatelets: Secondary | ICD-10-CM | POA: Diagnosis not present

## 2021-09-30 DIAGNOSIS — R931 Abnormal findings on diagnostic imaging of heart and coronary circulation: Secondary | ICD-10-CM

## 2021-09-30 DIAGNOSIS — I739 Peripheral vascular disease, unspecified: Secondary | ICD-10-CM | POA: Insufficient documentation

## 2021-09-30 DIAGNOSIS — Z888 Allergy status to other drugs, medicaments and biological substances status: Secondary | ICD-10-CM | POA: Insufficient documentation

## 2021-09-30 DIAGNOSIS — Z79899 Other long term (current) drug therapy: Secondary | ICD-10-CM | POA: Diagnosis not present

## 2021-09-30 DIAGNOSIS — F17210 Nicotine dependence, cigarettes, uncomplicated: Secondary | ICD-10-CM | POA: Insufficient documentation

## 2021-09-30 DIAGNOSIS — I2510 Atherosclerotic heart disease of native coronary artery without angina pectoris: Secondary | ICD-10-CM

## 2021-09-30 DIAGNOSIS — R0609 Other forms of dyspnea: Secondary | ICD-10-CM | POA: Diagnosis present

## 2021-09-30 DIAGNOSIS — Z7982 Long term (current) use of aspirin: Secondary | ICD-10-CM | POA: Insufficient documentation

## 2021-09-30 DIAGNOSIS — I25118 Atherosclerotic heart disease of native coronary artery with other forms of angina pectoris: Secondary | ICD-10-CM | POA: Diagnosis not present

## 2021-09-30 DIAGNOSIS — Z955 Presence of coronary angioplasty implant and graft: Secondary | ICD-10-CM | POA: Diagnosis not present

## 2021-09-30 DIAGNOSIS — I779 Disorder of arteries and arterioles, unspecified: Secondary | ICD-10-CM | POA: Diagnosis present

## 2021-09-30 DIAGNOSIS — I119 Hypertensive heart disease without heart failure: Secondary | ICD-10-CM | POA: Insufficient documentation

## 2021-09-30 HISTORY — PX: CORONARY STENT INTERVENTION: CATH118234

## 2021-09-30 HISTORY — PX: LEFT HEART CATH AND CORONARY ANGIOGRAPHY: CATH118249

## 2021-09-30 HISTORY — PX: CORONARY ULTRASOUND/IVUS: CATH118244

## 2021-09-30 HISTORY — PX: INTRAVASCULAR ULTRASOUND/IVUS: CATH118244

## 2021-09-30 LAB — POCT ACTIVATED CLOTTING TIME
Activated Clotting Time: 283 seconds
Activated Clotting Time: 283 seconds

## 2021-09-30 SURGERY — LEFT HEART CATH AND CORONARY ANGIOGRAPHY
Anesthesia: LOCAL

## 2021-09-30 MED ORDER — HEPARIN SODIUM (PORCINE) 1000 UNIT/ML IJ SOLN
INTRAMUSCULAR | Status: AC
  Filled 2021-09-30: qty 1

## 2021-09-30 MED ORDER — VERAPAMIL HCL 2.5 MG/ML IV SOLN
INTRAVENOUS | Status: AC
  Filled 2021-09-30: qty 2

## 2021-09-30 MED ORDER — ACETAMINOPHEN 325 MG PO TABS
650.0000 mg | ORAL_TABLET | ORAL | Status: DC | PRN
Start: 2021-09-30 — End: 2021-10-01

## 2021-09-30 MED ORDER — SODIUM CHLORIDE 0.9 % IV SOLN: 250.0000 mL | INTRAVENOUS | Status: DC | PRN

## 2021-09-30 MED ORDER — HYDRALAZINE HCL 20 MG/ML IJ SOLN: 10.0000 mg | INTRAMUSCULAR | Status: DC | PRN

## 2021-09-30 MED ORDER — SODIUM CHLORIDE 0.9% FLUSH: 3.0000 mL | Freq: Two times a day (BID) | INTRAVENOUS | Status: DC

## 2021-09-30 MED ORDER — SODIUM CHLORIDE 0.9% FLUSH: 3.0000 mL | INTRAVENOUS | Status: DC | PRN

## 2021-09-30 MED ORDER — HEPARIN SODIUM (PORCINE) 1000 UNIT/ML IJ SOLN
INTRAMUSCULAR | Status: DC | PRN
  Administered 2021-09-30: 2000 [IU] via INTRAVENOUS
  Administered 2021-09-30 (×2): 3000 [IU] via INTRAVENOUS
  Administered 2021-09-30: 2000 [IU] via INTRAVENOUS

## 2021-09-30 MED ORDER — HEPARIN (PORCINE) IN NACL 1000-0.9 UT/500ML-% IV SOLN
INTRAVENOUS | Status: AC
  Filled 2021-09-30: qty 500

## 2021-09-30 MED ORDER — LABETALOL HCL 5 MG/ML IV SOLN: 10.0000 mg | INTRAVENOUS | Status: DC | PRN

## 2021-09-30 MED ORDER — FENTANYL CITRATE (PF) 100 MCG/2ML IJ SOLN
INTRAMUSCULAR | Status: DC | PRN
  Administered 2021-09-30 (×2): 25 ug via INTRAVENOUS

## 2021-09-30 MED ORDER — LIDOCAINE HCL (PF) 1 % IJ SOLN
INTRAMUSCULAR | Status: DC | PRN
  Administered 2021-09-30: 2 mL

## 2021-09-30 MED ORDER — ONDANSETRON HCL 4 MG/2ML IJ SOLN: 4.0000 mg | Freq: Four times a day (QID) | INTRAMUSCULAR | Status: DC | PRN

## 2021-09-30 MED ORDER — HEPARIN (PORCINE) IN NACL 1000-0.9 UT/500ML-% IV SOLN
INTRAVENOUS | Status: DC | PRN
  Administered 2021-09-30 (×2): 500 mL

## 2021-09-30 MED ORDER — FENTANYL CITRATE (PF) 100 MCG/2ML IJ SOLN
INTRAMUSCULAR | Status: AC
  Filled 2021-09-30: qty 2

## 2021-09-30 MED ORDER — SODIUM CHLORIDE 0.9 % IV SOLN: INTRAVENOUS | Status: DC

## 2021-09-30 MED ORDER — MORPHINE SULFATE (PF) 2 MG/ML IV SOLN: 2.0000 mg | INTRAVENOUS | Status: DC | PRN

## 2021-09-30 MED ORDER — CLOPIDOGREL BISULFATE 300 MG PO TABS
ORAL_TABLET | ORAL | Status: DC | PRN
  Administered 2021-09-30: 300 mg via ORAL

## 2021-09-30 MED ORDER — LIDOCAINE HCL (PF) 1 % IJ SOLN
INTRAMUSCULAR | Status: AC
  Filled 2021-09-30: qty 30

## 2021-09-30 MED ORDER — SODIUM CHLORIDE 0.9 % WEIGHT BASED INFUSION
3.0000 mL/kg/h | INTRAVENOUS | Status: AC
  Administered 2021-09-30: 3 mL/kg/h via INTRAVENOUS

## 2021-09-30 MED ORDER — VERAPAMIL HCL 2.5 MG/ML IV SOLN
INTRAVENOUS | Status: DC | PRN
  Administered 2021-09-30: 10 mL via INTRA_ARTERIAL

## 2021-09-30 MED ORDER — CLOPIDOGREL BISULFATE 300 MG PO TABS
ORAL_TABLET | ORAL | Status: AC
  Filled 2021-09-30: qty 1

## 2021-09-30 MED ORDER — MIDAZOLAM HCL 2 MG/2ML IJ SOLN
INTRAMUSCULAR | Status: AC
  Filled 2021-09-30: qty 2

## 2021-09-30 MED ORDER — MIDAZOLAM HCL 2 MG/2ML IJ SOLN
INTRAMUSCULAR | Status: DC | PRN
  Administered 2021-09-30: 2 mg via INTRAVENOUS

## 2021-09-30 MED ORDER — SODIUM CHLORIDE 0.9 % WEIGHT BASED INFUSION: 1.0000 mL/kg/h | INTRAVENOUS | Status: DC

## 2021-09-30 SURGICAL SUPPLY — 22 items
BALLN SAPPHIRE 2.5X20 (BALLOONS) ×2
BALLN SAPPHIRE ~~LOC~~ 4.0X12 (BALLOONS) ×1 IMPLANT
BALLOON SAPPHIRE 2.5X20 (BALLOONS) IMPLANT
CATH LAUNCHER 6FR EBU3.5 (CATHETERS) ×1 IMPLANT
CATH OPTICROSS HD (CATHETERS) ×1 IMPLANT
CATH OPTITORQUE TIG 4.0 5F (CATHETERS) ×1 IMPLANT
CATH VISTA GUIDE 6FR JR4 (CATHETERS) ×1 IMPLANT
DEVICE RAD COMP TR BAND LRG (VASCULAR PRODUCTS) ×1 IMPLANT
ELECT DEFIB PAD ADLT CADENCE (PAD) ×1 IMPLANT
GLIDESHEATH SLEND SS 6F .021 (SHEATH) ×1 IMPLANT
GUIDEWIRE INQWIRE 1.5J.035X260 (WIRE) IMPLANT
INQWIRE 1.5J .035X260CM (WIRE) ×2
KIT ENCORE 26 ADVANTAGE (KITS) ×1 IMPLANT
KIT HEART LEFT (KITS) ×2 IMPLANT
PACK CARDIAC CATHETERIZATION (CUSTOM PROCEDURE TRAY) ×2 IMPLANT
SHEATH PROBE COVER 6X72 (BAG) ×1 IMPLANT
SLED PULL BACK IVUS (MISCELLANEOUS) ×1 IMPLANT
STENT ONYX FRONTIER 3.0X34 (Permanent Stent) ×1 IMPLANT
STENT ONYX FRONTIER 3.5X30 (Permanent Stent) ×1 IMPLANT
TRANSDUCER W/STOPCOCK (MISCELLANEOUS) ×2 IMPLANT
TUBING CIL FLEX 10 FLL-RA (TUBING) ×2 IMPLANT
WIRE ASAHI PROWATER 180CM (WIRE) ×1 IMPLANT

## 2021-09-30 NOTE — Interval H&P Note (Signed)
History and Physical Interval Note:  09/30/2021 11:51 AM  Brenda Carney  has presented today for surgery, with the diagnosis of abnormal cta with DOE.  The various methods of treatment have been discussed with the patient and family. After consideration of risks, benefits and other options for treatment, the patient has consented to  Procedure(s): LEFT HEART CATH AND CORONARY ANGIOGRAPHY (N/A)  PERCUTANEOUS CORONARY INTERVENTION  as a surgical intervention.  The patient's history has been reviewed, patient examined, no change in status, stable for surgery.  I have reviewed the patient's chart and labs.  Questions were answered to the patient's satisfaction.     Cath Lab Visit (complete for each Cath Lab visit)  Clinical Evaluation Leading to the Procedure:   ACS: No.  Non-ACS:    Anginal Classification: CCS III atypical angina, manifest as dyspnea on exertion  Anti-ischemic medical therapy: Minimal Therapy (1 class of medications)  Non-Invasive Test Results: High-risk stress test findings: cardiac mortality >3%/year  Prior CABG: No previous CABG     Glenetta Hew

## 2021-09-30 NOTE — Progress Notes (Signed)
CARDIAC REHAB PHASE I   Stent education completed with pt. Pt educated on importance of ASA and Plavix. Pt given stent card along with heart healthy diet. Reviewed site care, restrictions, and exercise guidelines. Will refer to [place] Rufina Falco, RN BSN 09/30/2021 3:20 PM

## 2021-09-30 NOTE — Discharge Instructions (Signed)
Information about your medication: Plavix (anti-platelet agent)  Generic Name (Brand): clopidogrel (Plavix), once daily medication  PURPOSE: You are taking this medication along with aspirin to lower your chance of having a heart attack, stroke, or blood clots in your heart stent. These can be fatal. Plavix and aspirin help prevent platelets from sticking together and forming a clot that can block an artery or your stent.   Common SIDE EFFECTS you may experience include: bruising or bleeding more easily, shortness of breath  Do not stop taking PLAVIX without talking to the doctor who prescribes it for you. People who are treated with a stent and stop taking Plavix too soon, have a higher risk of getting a blood clot in the stent, having a heart attack, or dying. If you stop Plavix because of bleeding, or for other reasons, your risk of a heart attack or stroke may increase.   Tell all of your doctors and dentists that you are taking Plavix. They should talk to the doctor who prescribed Plavix for you before you have any surgery or invasive procedure.   Contact your health care provider if you experience: severe or uncontrollable bleeding, pink/red/brown urine, vomiting blood or vomit that looks like "coffee grounds", red or black stools (looks like tar), coughing up blood or blood clots ----------------------------------------------------------------------------------------------------------------------  

## 2021-09-30 NOTE — Research (Addendum)
Reveal Plaque Informed Consent   Subject Name: Brenda Carney  Subject met inclusion and exclusion criteria.  The informed consent form, study requirements and expectations were reviewed with the subject and questions and concerns were addressed prior to the signing of the consent form.  The subject verbalized understanding of the trial requirements.  The subject agreed to participate in the Reveal Plaque trial and signed the informed consent at 1520 on 09/30/21.  The informed consent was obtained prior to performance of any protocol-specific procedures for the subject.  A copy of the signed informed consent was given to the subject and a copy was placed in the subject's medical record.   Brenda Carney  Inclusion: _0  1. Age >18 _1  2. Clinically stable patient with known CAD. _2   3. CCTA showing stenosis in at least one major epicardial vessel of stentable/graftable diameter, in whom clinically indicated IVUS is planned within 45 days of the CCTA  and FFR CT available. _3  4. FFR CT successfully processed. _4  5. Willing to comply with protocol. _5  6. Agrees to be included in the study and able to provide written informed consent.  Exclusion: _6  1. CCTA showing no stenosis. _7  2. Uninterpretable CCTA by HeartFlow assessment, in which image quality prevents FFR CT from being processed. _8  3. Acute chest pain. _9  4. CABG prior to CCTA acquisition. _10  5. Prior history of PCI for 3 or more vessels. _11  6. MI less than 30 days prior to CCTA or between CCTA and ICA. _12  7. Suspicion of acute coronary syndrome, Acute MI or Unstable Angina. _13  8. Known complex congenital heart disease. _14  9. Tachycardia or significant arrythmia. _15  10. Subject requires an emergent procedure. _16  11. Evidence of ongoing or active clinical instability, including acute chest pain  (sudden onset), cardiogenic shock, unstable blood pressure with systolic blood pressure <30 mmHg, and severe congestive heart failure (NYHA  lll or lV) or acute  pulmonary edema.  _17  12. Any active, serious, life-threatening disease with a life expectancy of less than 2 months. _18  13. Currently enrolled in another study utilizing FFR CT or in an investigational trial that involves a non-approved cardiac drug or device.  _19  14. Persons under the protection of justice, guardianship, or curatorship.     Reason for CCTA scan: _20  Chest Pain _21  Asymptomatic/screening     _22  Prior MI     _23  Dyspnea   _24  Palpitations _25  Abnormal ECG    _26    Abnormal Stress or Perfusion   _27  Other Which CT Scanner was used?          _28  Philips Model: _29  CT 5000 Ingenuity    _30  Spectral CT 7500   _31  CT6000 iCT    _32  IQon         _33   Incisive CT    _34   Other _35  Siemens Model: _36  Somatom Definition Edge    _37  Somatom Definition Flash            _38  Somatom Force  _39  Somatom Drive   <ZSWFUXNATFTDDUKG>_2<\/RKYHCWCBJSEGBTDV>_76   Other _41  Product/process development scientist:  _42  Revolution CT  _43  CardioGraph   _44  Optima CT660  _45   Discovery HD 750   _46  Other Radiation Exposure for CCTA Only (Sum for All Coronary Series)  CT dose index (CTDI) (mGy)  19.62  Dose-length product (DLP) (mGy-cm) 270.8   _47   CCTA Image Uploaded   _48  CCTA report uploaded  _49  All subject information redacted from image and report? Subject Demographics: Age:  __70___    Year of birth:  __1952___ Birth Sex: _0  Female   _1   Female  Weight: __54_ _2 lb  _3  kg   _4  Not done Height: __65___ _5  in  _6  cm   _7  Not done  Ethnicity: _8  Not of Hispanic, Latino/a, or Spanish origin _9  Hispanic, Latino/a, or Spanish origin Specify: _10   [place] American, Chicano/a      _11  [place] Rican  _12  [place] _13 other Hispanic, Latino/a, or Spanish origin Race:  _14  White  _15  Black  _16  American [place] or [place] Native  _17  Asian:  Specify: _18  Asian [place]   _19  [place]  _20  [place]  _21  [place]   _22  Filipino  _23  Other Asian  _24  [place]   _25  Native Hawaiian or other Pacific Islander              _26  Native Hawaiian   _27  Guamanian or Chamorro  _28   Samoan   _29  Other Pacific Islander   _30  Other (specify) Medical History: Angina within the last 45 days:   _31  Yes   _32  No If yes: _33  Typical _34  Atypical  _35  Dyspnea  _36  Noncardiac pain Angina type: _37  Stable _38  Unstable _39  Silent Ischemia  _40  Unknown _41  Other Congestive Heart Failure: _42  Yes _43  No Diabetes: _44  Yes  _45  No If yes:  _46  Type l   _47  Type ll Controlled by:  _48  Insulin   _49  Oral hypoglycemic  _50  Diet  _51   unknown Hypertension:  _52  Yes  <AYTKZSWFUXNATFTD>_3<\/UKGURKYHCWCBJSEG>_31   No  (Systolic >517, diastolic >61 or req. medication) Is subject taking anti-hypertensive medication?   _54  Yes  _55   No Hyperlipidemia:   _56  Yes  _57  No Is subject taking anti-hyperlipidemic medication?    _58  Yes  _59  No Tobacco Use:  _60  Former  _61  Current  _62   Non-smoker  _63   Unknown Any nicotine use in 24 hours prior to CCTA?   _64  Yes  _65   No Family history of premature atherosclerotic disease?   _66  Yes  _67   No (Coronary artery disease, cerebrovascular disease, or peripheral vascular disease for female 2o relatives < 54 and/or female 2o relatives < 89 years old) Stroke:  _68  Yes  _69  No Transient Ischemic Attack (TIA):   _70  Yes  _71  No Peripheral Vascular Disease:  _72  Yes  _73  No Sedentary Lifestyle:    _74  Yes   _75  No Other Relevant Disease or Comorbidity:    _76  Yes  _77  No Specify: ___________________   PCI History: Does the subject have previously stented vessels?   _78  Yes  _79  No ______________ Total number of PCI procedures ______________ Date of most recent PCI ______________ Total number of stented vessels NOTE: CASS Segment Diagram is provided for reference in Appendix A.  PCI History Records: (complete a record for each stent) Stent 1:           Vessel: _80  Right coronary artery  _81  Left main artery  _82  Left circumflex artery _83  Left anterior descending artery   _84  Other________________ CASS Segment for starting location of stent: ______________________________ CASS Segment for ending location of stent:  _______________________________ Stent manufacturer: _________________________________________________ Diameter of stent (mm): _____________   Length of stent (mm): _____________ Stent 2:           Vessel: _85  Right coronary artery  _86  Left main artery  _87  Left circumflex artery _88  Left anterior descending artery   _89  Other________________ CASS Segment for starting location of stent: ______________________________ CASS Segment for  ending location of stent: _______________________________ Stent manufacturer: _________________________________________________ Diameter of stent (mm): _____________   Length of stent (mm): _____________  Coronary Tests: Any coronary tests within 90 days prior to enrollment?  _0  Yes  _1  No Invasive Coronary Angiography (ICA):    _2  Yes _3  No Most recent test date: _________________    Result: _4  Positive  _5  Negative  _6  Intermediate  _7  Unknown _8  Image uploaded       Report: _9  Uploaded  _10  N/A _11  All subject information redacted from images and reports? Exercise Tolerance Test (ETT):  _12 Yes  _13  No     Most recent test date: Click or tap to enter a date. Result:   _14  Positive    _15  Negative  _16  Intermediate  _17   Unknown _18  Image uploaded    Report:  _19  Uploaded  _20  N/A  _21   All subject information redacted from images and reports? Stress Echocardiogram: _22  Yes  _23  No   Most recent date:Click or tap to enter a date. Result:  _24  Positive  _25  Negative  _26  Intermediate  _27  Unknown _28 Image uploaded    Report:  _29  Uploaded  _30  N/A      _31  All images redacted? Nuclear Myocardial Perfusion Scan:    _32  Yes  _33  No Most recent test date: Click or tap to enter a date.  Result:   _34  Positive    _35  Negative  _36  Intermediate  _37   Unknown _38 Image uploaded    Report:  _39  Uploaded  _40  N/A      _41  All images redacted? Cardiac MRI:   _42  Yes  _43  No Most recent test date: Click or tap to enter a date.  Result:   _44  Positive    _45  Negative  _46  Intermediate  _47   Unknown _48 Image  uploaded    Report:  _49  Uploaded  _50  N/A      _51  All images redacted? Cardiac PET:    _52  Yes  _53  No Most recent test date: Click or tap to enter a date.  Result:   _54  Positive    _55  Negative  _56  Intermediate  _57   Unknown _58 Image uploaded    Report:  _59  Uploaded  _60  N/A      _61  All images redacted?  Baseline Cath Procedure: Date of procedure:09/30/2021 Blood pressure prior to procedure: 120/77  Heart Rate prior to procedure: 97 Time of arterial access: 1218 Time of first lesion treatment: 1258 Time last guide catheter removed: 1346 Aortic pressure (mean mmHg): 88 LVED pressure (mmHg): 2 Arterial access site:   _62  Radial  _63  Femoral  _64  Brachial Maximum arterial sheath size (Fr) _6__ Venous access needle size:  _65  18 G  _66  20 G  _67  4 FR  _68  5 FR  _69  6 FR  _70  Other Left ventriculography performed?  _71  Yes  _72  No If yes, when:   _73  Pre-intervention  _74  Post- intervention LVEF (%):__65_ Were there any procedural complications?      _75  Yes  _76  No _77  Dissection from wire  _78  Dissection from catheter  _79  Slow / no flow _80  Allergic reaction to medication  _81  Side branch occlusion  _82  Other  Was the FFRCT model available & used in cath lab during the procedure? _83  Y  _84  N _85  Angio image uploaded  _86  Cath report uploaded  _87  All subject information redacted from images and reports?  Intraprocedural Medications: Was adenosine administered within 24 hrs of the cath procedure?  _88   Yes  _0  No IV Adenosine start time: _______________ Adenosine dose:  _1  140 ug/kg/min  _2  Other dose  _3  None Was nitroglycerin administered within 24 hrs of the cath procedure? _4  Yes _5  No NTG dose: _____________ NTG Route: _6  IV  _7  IC  _8  Other  Intraprocedural Devices: (total number of each device used during the procedure): _____0____ Pressure wires & FFR wires ____1_____ Guidewires   (Do not include wires used before intervention and pressure/FFR wires) ____2_____ Balloon catheters ____2_____ Drug  eluting stents ____0_____ Bare metal stents _____1____ Guide catheters _____1____ Intravascular ultrasound catheters ____0_____ Intravascular ultrasound OCT catheters ___0______ Thrombectomy and atherectomy catheters ___0______ Temporary pacemakers ___0______ Intra-aortic balloon pumps (IABP) Intraprocedural Radiation exposure: Total fluoroscopy time __24.5____ (minutes) Absorbed Dose of Radiation ___307.4__   _9  mGy  _10  Gy Dose Area Product: _13___  _11  Gy*cm2  _12  cGy*cm2  _13  mGy*cm2  Other: ______________ Contrast Use: (check all that apply) _14  Ominipaque  _15  Visipaque  _16   Isovue  _17  Iomeron  _18  Ultravist   _19 Oxilan _20  Hypaque  _21  Isopaque  _22  Hexibrix  _23  OptiRay  _24  Iopamidol  Total amount of contrast used (mL): ___180_____  IVUS/OCT: Model: _25  Philips Volcano                    Catheter type:    _26  Revolution  _27  Eagle Eye Platinum  _28  Eagle Eye  P     Platinum ST          _29  Refinity ST               _30  [employer] Scientific                   Catheter type:  _31  Opticross   _32  Opticross HD  _33  Other                _34  Terumo                    Catheter type:   _35  ViewIT    _36  Intrafocus WR  Ivus Image uploaded:  _37  Yes  _38  No OCT taken?   _39  Yes   _40  No   _41  All subject information redacted from images and reports?  IVUS/OCT Records Was automatic pullback used for IVUS/OCT acquisition?   _42  Yes  _43  No      Pullback speed (mm/sec): ___0.5___      Length of pullback (mm): ___39___ Vessel 1: Image type:   _44  IVUS   _45  OCT  _46  Right coronary artery  _47  Left main artery  _48  Left circumflex artery  _49  Left anterior descending artery   _50  Other________________  Guide catheter size:  _51 4 FR  _52 5 FR  _53 6 FR  _54 7 FR  _55 8 FR  Vessel 2: Image type:   _56  IVUS   _57  OCT  _58  Right coronary artery  _59  Left main artery  _60  Left circumflex artery _61  Left anterior descending artery   _62  Other________________  Guide catheter size:  _63 4 FR  _64 5 FR  _65 6 FR  _66 7 FR  _67 8 FR Vessel  3: Image type:   _68  IVUS   _69  OCT  _70  Right coronary artery  _71  Left main artery  _72  Left circumflex artery _73  Left anterior descending artery   _74  Other________________  Guide catheter size:  _75 4 FR  _76 5 FR  _77 6 FR  _78 7 FR  _79 8 FR FFR/NHPR Records Measurement taken during cath procedure: _80   FFR  _0  DFR  _1  IFR  _2  RFR   _3 Other  Vessel 1: _4  Right coronary artery  _5  Left main artery  _6  Left circumflex artery _7  Left anterior descending artery   _8  Other________________ Contrast used?     _9  Yes   _10  No CASS Segment for starting location of guide wire: __________________________ CASS Segment for ending location of guide wire: ___________________________ Guide catheter size:  _11 4 FR  _12 5 FR  _13 6 FR  _14 7 FR  _15 8 FR FFR Value: _____________ Pd value: ______________ Pa value: ______________ NHPR value: ____________  Vessel 2: _16  Right coronary artery  _17  Left main artery  _18  Left circumflex artery _19  Left anterior descending artery   _20  Other________________ Contrast used?     _21  Yes   _22  No CASS Segment for starting location of guide wire: __________________________ CASS Segment for ending location of guide wire: ___________________________ Guide catheter size:  _23 4 FR  _24 5 FR  _25 6 FR  _26 7 FR  _27 8 FR FFR Value: _____________ Pd value: ______________ Pa value: ______________ NHPR value: ____________  Vessel 3: _28  Right coronary artery  _29  Left main artery  _30  Left circumflex artery _31  Left anterior descending artery   _32  Other________________ Contrast used?     _33  Yes   _34  No CASS Segment for starting location of guide wire: __________________________ CASS Segment for ending location of guide wire: ___________________________ Guide catheter size:  _35 4 FR  _36 5 FR  _37 6 FR  _38 7 FR  _39 8 FR FFR Value: _____________ Pd value: ______________ Pa value: ______________ NHPR value: ____________  FFR Uploads _40  Angio Image  _41  FFR Report  _42  FFR Tracings  _43  Screenshot of Angio -  starting position of FFR guidewire without contrast _44  Screenshot of Angio - starting position of FFR guidewire with contrast  NHPR Uploads _45  Angio Image  _46  NHPR Report  _47  NHPR Tracings  _48  Screenshot of Angio - starting position of NHPR guidewire without contrast _49  Screenshot of Angio - starting position of NHPR guidewire with contrast _50  All subject information redacted from images and reports?  Study completion:  Did subject complete participation in the study?    _51  Yes  _52  No If no, reason: _53  Death _54  Withdrew consent  _55  Other  _56  Screen Failure Screen Failure reason:  _57  Ivus not used  _58  Use of balloon prior   _59  Automatic pullback not used

## 2021-09-30 NOTE — Discharge Summary (Signed)
Discharge Summary for Same Day PCI   Patient ID: Brenda Carney MRN: [id number]; DOB: 04/25/1951  Admit date: 09/30/2021 Discharge date: 09/30/2021  Primary Care Provider: Michael Boston, MD  Primary Cardiologist: Glenetta Hew, MD  Primary Electrophysiologist:  None   Discharge Diagnoses    Principal Problem:   Abnormal findings diagnostic imaging of heart and coronary circulation Active Problems:   DOE (dyspnea on exertion)   Coronary artery disease  Diagnostic Studies/Procedures    Cardiac Catheterization 09/30/2021:   LESION SEGMENT #1: Prox Cx lesion is 45% stenosed with 30% stenosed side branch in 1st Mrg.  Prox Cx to Mid Cx lesion is 80% stenosed. Mid Cx lesion is 50% stenosed.   A drug-eluting stent was successfully placed (based on IVUS guidance) covering all 3 lesions segments, using a STENT ONYX FRONTIER 3.0X34.  Postdilated to 3.3 mm   Post intervention, there is a 0% residual stenosis throughout the entire segment. Post intervention, the small caliber OM1 side branch was reduced to 30% residual stenosis.   ------------------------------------------------------   LESION SEGMENT #2 prox RCA-1 lesion is 30% stenosed.  Prox RCA-2 lesion is 70% stenosed. Prox RCA to Mid RCA lesion is 90% stenosed (irregular/ulcerated plaque).   A drug-eluting stent was successfully placed covering the entire diseased segment, using a STENT ONYX FRONTIER 3.5X30.  Postdilated from 4.1 down to 3.7 mm.   Post intervention, there is a 0% residual stenosis throughout the stented segment.Marland Kitchen   -------------------------------------------------------   Dist RCA lesion is 60% stenosed.   Mild diffuse LAD disease   Mid Cx lesion is 50% stenosed.   Prox RCA to Mid RCA lesion is 90% stenosed.   -------------------------------------------------------   Post intervention, there is a 0% residual stenosis.   Post intervention, the side branch was reduced to 30% residual stenosis.   There is hyperdynamic left  ventricular systolic function.  The left ventricular ejection fraction is greater than 65% by visual estimate.   LV end diastolic pressure is normal.   SUMMARY Severe two-vessel CAD: Consistent with cardiac CTA, mid LCx focal 80% stenosis, but with IVUS there appeared to be more diffuse disease ranging from 40 to 80% with 60 to 75% circumferential mixed calcified and noncalcified plaque burden Successful IVUS guided DES PCI of the proximal to mid LCx using Onyx Frontier DES 3.0 mm x 34 mm deployed to 3.3 mm.  Reducing the stenosis to 0% with TIMI-3 flow pre and post Not seen on CTA was a proximal to mid ulcerated 70-90% irregular plaque just after the first bend that had appearance of a recently ruptured plaque.  There is downstream 60% stenosis that does not appear flow-limiting. Successful DES PCI of the proximal to mid RCA using Onyx Frontier DES 3.5 mm x 30 mm postdilated in tapered fashion from 4.16mm to 3.7 mm.  Reduced stenosis to 0% with TIMI-3 flow pre and post Normal LAD that is relatively diminutive and does not reach the apex as the PDA is wraparound. Hyperdynamic left ventricular function with normal EF and normal pressures.   RECOMMENDATIONS She is already on aspirin and Plavix.  Should be fine for same-day discharge. Continue PCSK9 inhibitor for lipids.  Statin intolerant. Continue calcium channel blocker in lieu of beta-blocker along with ACE inhibitor. Follow-up with Dr. Ellyn Hack or APP    Glenetta Hew, MD   Diagnostic Dominance: Right Intervention   _____________   History of Present Illness     Brenda Carney is a 70 y.o. female with a PMH  Notable for Coronary Calcification -> Coronary Calcium Score > 1000, Nonischemic Nuclear Stress Test), PAD (history of R femoropopliteal bypass and right SFA PTA), HTN, HLD (on Repatha) & Centilobular Emphysema, Former Heavy Smoker (quit last yr)who recently presented to the office with Dr. Ellyn Hack to discuss recent results of her  Coronary CT Angiogram with positive CT FFR of the OM1. Cardiac catheterization was arranged for further evaluation.  Hospital Course     The patient underwent cardiac cath as noted above with severe 2v CAD, IVUS guided PCI/DES of p/mLCx and PCI/DES x1 to p/mRCA. Also noted to have 70-90% irregular plaque felt to represent recently ruptured plaque with 60% stenosis downstream. Plan for DAPT with ASA/plavix for at least one year. The patient was seen by cardiac rehab while in short stay. There were no observed complications post cath. Radial cath site was re-evaluated prior to discharge and found to be stable without any complications. Instructions/precautions regarding cath site care were given prior to discharge.  [name] was seen by Dr. Ellyn Hack and determined stable for discharge home. Follow up with our office has been arranged. Medications are listed below. Pertinent changes include N/a.  _____________  Cath/PCI Registry Performance & Quality Measures: Aspirin prescribed? - Yes ADP Receptor Inhibitor (Plavix/Clopidogrel, Brilinta/Ticagrelor or Effient/Prasugrel) prescribed (includes medically managed patients)? - Yes High Intensity Statin (Lipitor 40-80mg  or Crestor 20-40mg ) prescribed? - No - on repatha For EF <40%, was ACEI/ARB prescribed? - Not Applicable (EF >/= 59%) For EF <40%, Aldosterone Antagonist (Spironolactone or Eplerenone) prescribed? - Not Applicable (EF >/= 16%) Cardiac Rehab Phase II ordered (Included Medically managed Patients)? - Yes  _____________   Discharge Vitals Blood pressure (!) 152/96, pulse 89, temperature 98.2 F (36.8 C), temperature source Oral, resp. rate 10, height 5\' 5"  (1.651 m), weight 54 kg, SpO2 97 %.  Filed Weights   09/30/21 1000  Weight: 54 kg    Last Labs & Radiologic Studies    CBC No results for input(s): WBC, NEUTROABS, HGB, HCT, MCV, PLT in the last 72 hours. Basic Metabolic Panel No results for input(s): NA, K, CL, CO2,  GLUCOSE, BUN, CREATININE, CALCIUM, MG, PHOS in the last 72 hours. Liver Function Tests No results for input(s): AST, ALT, ALKPHOS, BILITOT, PROT, ALBUMIN in the last 72 hours. No results for input(s): LIPASE, AMYLASE in the last 72 hours. High Sensitivity Troponin:   No results for input(s): TROPONINIHS in the last 720 hours.  BNP Invalid input(s): POCBNP D-Dimer No results for input(s): DDIMER in the last 72 hours. Hemoglobin A1C No results for input(s): HGBA1C in the last 72 hours. Fasting Lipid Panel No results for input(s): CHOL, HDL, LDLCALC, TRIG, CHOLHDL, LDLDIRECT in the last 72 hours. Thyroid Function Tests No results for input(s): TSH, T4TOTAL, T3FREE, THYROIDAB in the last 72 hours.  Invalid input(s): FREET3 _____________  CARDIAC CATHETERIZATION  Addendum Date: 09/30/2021     LESION SEGMENT #1: Prox Cx lesion is 45% stenosed with 30% stenosed side branch in 1st Mrg.  Prox Cx to Mid Cx lesion is 80% stenosed. Mid Cx lesion is 50% stenosed.   A drug-eluting stent was successfully placed (based on IVUS guidance) covering all 3 lesions segments, using a STENT ONYX FRONTIER 3.0X34.  Postdilated to 3.3 mm   Post intervention, there is a 0% residual stenosis throughout the entire segment. Post intervention, the small caliber OM1 side branch was reduced to 30% residual stenosis.   ------------------------------------------------------   LESION SEGMENT #2 prox RCA-1 lesion is 30% stenosed.  Prox RCA-2 lesion is 70% stenosed. Prox RCA to Mid RCA lesion is 90% stenosed (irregular/ulcerated plaque).   A drug-eluting stent was successfully placed covering the entire diseased segment, using a STENT ONYX FRONTIER 3.5X30.  Postdilated from 4.1 down to 3.7 mm.   Post intervention, there is a 0% residual stenosis throughout the stented segment.Marland Kitchen   -------------------------------------------------------   Dist RCA lesion is 60% stenosed.   Mild diffuse LAD disease   Mid Cx lesion is 50% stenosed.    Prox RCA to Mid RCA lesion is 90% stenosed.   -------------------------------------------------------   Post intervention, there is a 0% residual stenosis.   Post intervention, the side branch was reduced to 30% residual stenosis.   There is hyperdynamic left ventricular systolic function.  The left ventricular ejection fraction is greater than 65% by visual estimate.   LV end diastolic pressure is normal. SUMMARY Severe two-vessel CAD: Consistent with cardiac CTA, mid LCx focal 80% stenosis, but with IVUS there appeared to be more diffuse disease ranging from 40 to 80% with 60 to 75% circumferential mixed calcified and noncalcified plaque burden Successful IVUS guided DES PCI of the proximal to mid LCx using Onyx Frontier DES 3.0 mm x 34 mm deployed to 3.3 mm.  Reducing the stenosis to 0% with TIMI-3 flow pre and post Not seen on CTA was a proximal to mid ulcerated 70-90% irregular plaque just after the first bend that had appearance of a recently ruptured plaque.  There is downstream 60% stenosis that does not appear flow-limiting. Successful DES PCI of the proximal to mid RCA using Onyx Frontier DES 3.5 mm x 30 mm postdilated in tapered fashion from 4.82mm to 3.7 mm.  Reduced stenosis to 0% with TIMI-3 flow pre and post Normal LAD that is relatively diminutive and does not reach the apex as the PDA is wraparound. Hyperdynamic left ventricular function with normal EF and normal pressures. RECOMMENDATIONS She is already on aspirin and Plavix.  Should be fine for same-day discharge. Continue PCSK9 inhibitor for lipids.  Statin intolerant. Continue calcium channel blocker in lieu of beta-blocker along with ACE inhibitor. Follow-up with Dr. Ellyn Hack or APP Glenetta Hew, MD   Result Date: 09/30/2021   LESION SEGMENT #1: Prox Cx lesion is 45% stenosed with 30% stenosed side branch in 1st Mrg.  Prox Cx to Mid Cx lesion is 80% stenosed. Mid Cx lesion is 50% stenosed.   A drug-eluting stent was successfully placed (based  on IVUS guidance) covering all 3 lesions segments, using a STENT ONYX FRONTIER 3.0X34.  Postdilated to 3.3 mm   Post intervention, there is a 0% residual stenosis throughout the entire segment. Post intervention, the small caliber OM1 side branch was reduced to 30% residual stenosis.   ------------------------------------------------------   LESION SEGMENT #2 prox RCA-1 lesion is 30% stenosed.  Prox RCA-2 lesion is 70% stenosed. Prox RCA to Mid RCA lesion is 90% stenosed (irregular/ulcerated plaque).   A drug-eluting stent was successfully placed covering the entire diseased segment, using a STENT ONYX FRONTIER 3.5X30.  Postdilated from 4.1 down to 3.7 mm.   Post intervention, there is a 0% residual stenosis throughout the stented segment.Marland Kitchen   -------------------------------------------------------   Dist RCA lesion is 60% stenosed.   Mild diffuse LAD disease   Mid Cx lesion is 50% stenosed.   Prox RCA to Mid RCA lesion is 90% stenosed.   -------------------------------------------------------   Post intervention, there is a 0% residual stenosis.   Post intervention, the side branch was reduced  to 30% residual stenosis.   There is hyperdynamic left ventricular systolic function.  The left ventricular ejection fraction is greater than 65% by visual estimate.   LV end diastolic pressure is normal. SUMMARY Severe two-vessel CAD: Consistent with cardiac CTA, mid LCx focal 80% stenosis, but with IVUS there appeared to be more diffuse disease ranging from 40 to 80% with 60 to 75% circumferential mixed calcified and noncalcified plaque burden Successful IVUS guided DES PCI of the proximal to mid LCx using Onyx Frontier DES 3.0 mm x 34 mm deployed to 3.3 mm.  Reducing the stenosis to 0% with TIMI-3 flow pre and post Not seen on CTA was a proximal to mid ulcerated 70-90% irregular plaque just after the first bend that had appearance of a recently ruptured plaque.  There is downstream 60% stenosis that does not appear  flow-limiting. Successful DES PCI of the proximal to mid RCA using Onyx Frontier DES 3.5 mm x 30 mm postdilated in tapered fashion from 4.48mm to 3.7 mm.  Reduced stenosis to 0% with TIMI-3 flow pre and post Normal LAD that is relatively diminutive and does not reach the apex as the PDA is wraparound. Hyperdynamic left ventricular function with normal EF and normal pressures. RECOMMENDATIONS She is already on aspirin and Plavix.  Should be fine for same-day discharge. Continue PCSK9 inhibitor for lipids.  Statin intolerant. Continue calcium channel blocker in lieu of beta-blocker along with ACE inhibitor. Follow-up with Dr. Ellyn Hack or APP Glenetta Hew, MD   CT CORONARY [facility] W/CTA COR W/SCORE Lewanda Rife W/CM &/OR WO/CM  Addendum Date: 09/09/2021   ADDENDUM REPORT: 09/09/2021 20:45 EXAM: Cardiac/Coronary  CTA TECHNIQUE: The patient was scanned on a [employer]. FINDINGS: A 100 kV prospective scan was triggered in the descending thoracic aorta at 111 HU's. Axial non-contrast 3 mm slices were carried out through the heart. The data set was analyzed on a dedicated work station and scored using the [employer]. Gantry rotation speed was 250 msecs and collimation was .6 mm. 0.8 mg of sl NTG was given. The 3D data set was reconstructed in 5% intervals of the 67-82 % of the R-R cycle. Diastolic phases were analyzed on a dedicated work station using MPR, MIP and VRT modes. The patient received 80 cc of contrast. Aorta:  Normal size.  Diffuse calcifications.  No dissection. Aortic Valve:  Trileaflet.  No calcifications. Coronary Arteries:  Normal coronary origin.  Right dominance. RCA is a large dominant artery that gives rise to PDA and PLA. There is diffuse calcified plaque, with multiple areas of 25-49% stenosis. FFR is normal. Left main is a large artery that gives rise to LAD and LCX arteries. There is distal LM calcified plaque, 0-24%. LAD is a large vessel that has proximal calcified plaque 25-49%. FFR is  normal. LCX is a non-dominant artery that gives rise to one large OM1 branch . There is diffuse proximal calcified plaque with mid OM stenosis of 70% - FFR abnormal (0.75 with proximal 0.93). There is a small ramus branch. Calcium score LM: 0 LAD: 507 LCX: 268 RCA: 838 Total 1613 Percentile 99% Other findings: Normal pulmonary vein drainage into the left atrium. Normal left atrial appendage without a thrombus. Normal size of the pulmonary artery. Severe mitral annular calcification. Please see radiology report for non cardiac findings. IMPRESSION: 1. Coronary calcium score of 1613. This was 52 percentile for age and sex matched control. 2. Normal coronary origin with right dominance. 3.  Moderate RCA, LAD disease (FFR  normal). 4. Significant stenosis (>70%) of mid obtuse marginal (FFR abnormal 0.75 from 0.93). 5.  Severe mitral annular calcification. 6.  Diffuse aortic atherosclerosis. Electronically Signed   By: Candee Furbish M.D.   On: 09/09/2021 20:45   Result Date: 09/09/2021 EXAM: OVER-READ INTERPRETATION  CT CHEST The following report is an over-read performed by radiologist Dr. Abigail Miyamoto of [facility], [place] on 09/09/2021. This over-read does not include interpretation of cardiac or coronary anatomy or pathology. The coronary CTA interpretation by the cardiologist is attached. COMPARISON:  06/17/2019 FINDINGS: Vascular: Aortic atherosclerosis. The aorta measures 3.8 cm at the level of the sinuses and up to 3.5 cm in the imaged ascending segment. Tortuous descending thoracic aorta. No central pulmonary embolism, on this non-dedicated study. Mediastinum/Nodes: No imaged thoracic adenopathy. Lungs/Pleura: No pleural fluid. Left greater than right lower lobe material filling bronchi, similar to on the prior, including on 43/11. Moderate centrilobular emphysema. Bibasilar scarring. Upper Abdomen: Normal imaged portions of the liver, spleen, stomach. Musculoskeletal: Osteopenia. Remote lower right rib  fractures. A superior endplate compression deformity at T8 is unchanged. IMPRESSION: 1. No acute findings in the imaged extracardiac chest. 2. No evidence of aortic aneurysm. 3. Similar left greater than right lower lobe endobronchial material, which could represent mucoid impaction or chronic aspiration. 4. Similar T8 compression deformity. 5. Aortic Atherosclerosis (ICD10-I70.0) and Emphysema (ICD10-J43.9). Electronically Signed: By: Abigail Miyamoto M.D. On: 09/09/2021 16:14   CT CORONARY FRACTIONAL FLOW RESERVE FLUID ANALYSIS  Result Date: 09/12/2021 EXAM: CT FFR ANALYSIS FINDINGS: CT FFR analysis was performed on the original cardiac computed tomography angiogram dataset. Diagrammatic representation of the CT FFR analysis is provided in a separate PDF document in PACS. This dictation was created using the PDF document and an interactive 3D model of the results. The 3D model is not available in the EMR/PACS. Normal CT FFR range is >0.80. 1. Left Main: No significant stenosis. 2. LAD: No significant stenosis. 3. LCX: Significant stenosis (>70%) of mid obtuse marginal (FFR abnormal 0.75 from 0.93). 4. RCA: No significant stenosis. IMPRESSION: 1. Significant stenosis (>70%) of mid obtuse marginal (FFR abnormal 0.75 from 0.93). Electronically Signed   By: Candee Furbish M.D.   On: 09/12/2021 18:16   VAS [place] ABI WITH/WO TBI  Result Date: 09/27/2021  LOWER EXTREMITY DOPPLER STUDY Patient Name:  DELCIA SPITZLEY  Date of Exam:   09/27/2021 Medical Rec #: [id number]        Accession #:    [id number] Date of Birth: Jun 15, 1951        Patient Gender: F Patient Age:   55 years Exam Location:  [facility] Procedure:      VAS [place] ABI WITH/WO TBI Referring Phys: Monica Martinez --------------------------------------------------------------------------------  Indications: Claudication, peripheral artery disease, and patient indicates she              has buttock claudication. High Risk Factors: Hypertension,  hyperlipidemia.  Vascular Interventions: Right AK to BK pop bypass graft on 05/15/17.                         Right SFA laser atherectomy and SFA balloon angioplasty                         06/16/2020. Performing Technologist: Ralene Cork RVT  Examination Guidelines: A complete evaluation includes at minimum, Doppler waveform signals and systolic blood pressure reading at the level of bilateral brachial, anterior tibial,  and posterior tibial arteries, when vessel segments are accessible. Bilateral testing is considered an integral part of a complete examination. Photoelectric Plethysmograph (PPG) waveforms and toe systolic pressure readings are included as required and additional duplex testing as needed. Limited examinations for reoccurring indications may be performed as noted.  ABI Findings: +---------+------------------+-----+----------+--------+ Right    Rt Pressure (mmHg)IndexWaveform  Comment  +---------+------------------+-----+----------+--------+ Brachial 136                                       +---------+------------------+-----+----------+--------+ PTA      124               0.91 biphasic           +---------+------------------+-----+----------+--------+ DP       116               0.85 monophasic         +---------+------------------+-----+----------+--------+ Great Toe52                0.38                    +---------+------------------+-----+----------+--------+ +---------+------------------+-----+--------+-------+ Left     Lt Pressure (mmHg)IndexWaveformComment +---------+------------------+-----+--------+-------+ Brachial 136                                    +---------+------------------+-----+--------+-------+ PTA      131               0.96 biphasic        +---------+------------------+-----+--------+-------+ DP       127               0.93 biphasic        +---------+------------------+-----+--------+-------+ Great Toe83                 0.61                 +---------+------------------+-----+--------+-------+ +-------+-----------+-----------+------------+------------+ ABI/TBIToday's ABIToday's TBIPrevious ABIPrevious TBI +-------+-----------+-----------+------------+------------+ Right  0.91       0.38       1.03        0.57         +-------+-----------+-----------+------------+------------+ Left   0.96       0.61       1.09        0.73         +-------+-----------+-----------+------------+------------+  Arterial wall calcification precludes accurate ankle pressures and ABIs. Previous ABI on 08/03/20.  Summary: Right: Resting right ankle-brachial index indicates mild right lower extremity arterial disease. The right toe-brachial index is abnormal. Left: Resting left ankle-brachial index is within normal range. The left toe-brachial index is abnormal.  *See table(s) above for measurements and observations.  Electronically signed by Monica Martinez MD on 09/27/2021 at 12:39:47 PM.    Final    VAS [place] LOWER EXTREMITY ARTERIAL DUPLEX  Result Date: 09/27/2021 LOWER EXTREMITY ARTERIAL DUPLEX STUDY Patient Name:  Brenda Carney  Date of Exam:   09/27/2021 Medical Rec #: [id number]        Accession #:    [id number] Date of Birth: [birth year]        Patient Gender: F Patient Age:   8 years Exam Location:  [facility] Procedure:      VAS [place] LOWER EXTREMITY ARTERIAL DUPLEX Referring Phys: Monica Martinez --------------------------------------------------------------------------------  Indications:  Claudication, peripheral artery disease, and patient indicates she              has buttock claudication. High Risk Factors: Hypertension, hyperlipidemia.  Vascular Interventions: Right AK to BK pop bypass graft on 05/15/17.                         Right SFA laser atherectomy and SFA balloon angioplasty                         06/16/2020. Current ABI:            Right: 0.91/0.38 Left: 0.96/0.61 Performing Technologist:  Ralene Cork RVT  Examination Guidelines: A complete evaluation includes B-mode imaging, spectral Doppler, color Doppler, and power Doppler as needed of all accessible portions of each vessel. Bilateral testing is considered an integral part of a complete examination. Limited examinations for reoccurring indications may be performed as noted.  +----------+--------+-----+--------+---------+--------+ RIGHT     PSV cm/sRatioStenosisWaveform Comments +----------+--------+-----+--------+---------+--------+ CFA Distal82                   triphasic         +----------+--------+-----+--------+---------+--------+ DFA       102                  triphasic         +----------+--------+-----+--------+---------+--------+ SFA Prox  87                   triphasic         +----------+--------+-----+--------+---------+--------+ SFA Mid   41                   biphasic thumpy   +----------+--------+-----+--------+---------+--------+ SFA Distal76                   triphasic         +----------+--------+-----+--------+---------+--------+  Right Graft #1: AK to BK popliteal +------------------+--------+--------+----------+--------+                   PSV cm/sStenosisWaveform  Comments +------------------+--------+--------+----------+--------+ Inflow            55              triphasic          +------------------+--------+--------+----------+--------+ Prox Anastomosis  71              biphasic           +------------------+--------+--------+----------+--------+ Proximal Graft    30              monophasic         +------------------+--------+--------+----------+--------+ Mid Graft         25              biphasic           +------------------+--------+--------+----------+--------+ Distal Graft      24              biphasic           +------------------+--------+--------+----------+--------+ Distal Anastomosis24              monophasic          +------------------+--------+--------+----------+--------+ Outflow           54              triphasic          +------------------+--------+--------+----------+--------+  Summary: Right: Patent graft with no stenosis identified.  Irregular calcified plaque in the SFA with an area of possible near occlusion in the mid thigh.  See table(s) above for measurements and observations. Electronically signed by Monica Martinez MD on 09/27/2021 at 12:39:22 PM.    Final     Disposition   Pt is being discharged home today in good condition.  Follow-up Plans & Appointments     Follow-up Information     Deberah Pelton, NP Follow up on 10/19/2021.   Specialty: Cardiology Why: at 9:15am for your follow up appt with Dr. Allison Quarry NP Contact information: [address] [address] [place] [address] [phone]                Discharge Instructions     Amb Referral to Cardiac Rehabilitation   Complete by: As directed    Diagnosis: Coronary Stents   After initial evaluation and assessments completed: Virtual Based Care may be provided alone or in conjunction with Phase 2 Cardiac Rehab based on patient barriers.: Yes      Discharge Medications   Allergies as of 09/30/2021       Reactions   Penicillins Swelling, Other (See Comments)   TONGUE SWELLING PATIENT HAD A PCN REACTION WITH IMMEDIATE RASH, FACIAL/TONGUE/THROAT SWELLING, SOB, OR LIGHTHEADEDNESS WITH HYPOTENSION:  #  #  #  YES  #  #  #   Has patient had a PCN reaction causing severe rash involving mucus membranes or skin necrosis: No Has patient had a PCN reaction that required hospitalization: No Has patient had a PCN reaction occurring within the last 10 years: No   Statins    muscle pain   Lyrica [pregabalin] Other (See Comments)   DRY MOUTH        Medication List     TAKE these medications    albuterol 108 (90 Base) MCG/ACT inhaler Commonly known as: VENTOLIN HFA Inhale 2 puffs into the lungs every 4  (four) hours as needed for wheezing or shortness of breath.   amLODipine 10 MG tablet Commonly known as: NORVASC Take 1 tablet (10 mg total) by mouth daily.   Anoro Ellipta 62.5-25 MCG/INH Aepb Generic drug: umeclidinium-vilanterol Inhale 2 puffs into the lungs daily as needed (wheezing).   ARIPiprazole 2 MG tablet Commonly known as: ABILIFY Take 2 mg by mouth daily.   aspirin EC 81 MG tablet Take 81 mg by mouth daily. Swallow whole.   buPROPion 150 MG 24 hr tablet Commonly known as: WELLBUTRIN XL Take 150 mg by mouth daily.   CALCIUM-VITAMIN D3 PO Take 1 tablet by mouth daily. 1200 mg / 5000 units   clopidogrel 75 MG tablet Commonly known as: PLAVIX Take 1 tablet (75 mg total) by mouth daily.   desvenlafaxine 100 MG 24 hr tablet Commonly known as: PRISTIQ Take 100 mg by mouth daily.   Estradiol 10 MCG Tabs vaginal tablet Place 10 mcg vaginally once a week.   lisinopril 10 MG tablet Commonly known as: ZESTRIL Take 10 mg by mouth daily.   multivitamin with minerals Tabs tablet Take 1 tablet by mouth daily. One-A-Day New Chapter   oxyCODONE 5 MG immediate release tablet Commonly known as: Oxy IR/ROXICODONE Take 1-2 tablets (5-10 mg total) by mouth every 6 (six) hours as needed for moderate pain (pain score 4-6). What changed:  how much to take when to take this   pantoprazole 40 MG tablet Commonly known as: PROTONIX Take 40 mg by mouth every morning.   Repatha SureClick 202 MG/ML Soaj Generic drug:  Evolocumab INJECT CONTENTS OF 1 PEN SUBCUTANEOUSLY EVERY 14 DAYS.   Systane Complete 0.6 % Soln Generic drug: Propylene Glycol Place 1 drop into both eyes 4 (four) times daily as needed (Dry eye).   Theratears 0.25 % Soln Generic drug: Carboxymethylcellulose Sodium Place 1 drop into both eyes 4 (four) times daily as needed (dry eye).   traZODone 50 MG tablet Commonly known as: DESYREL Take 50 mg by mouth at bedtime.   Visine 0.025-0.3 % ophthalmic  solution Generic drug: naphazoline-pheniramine Place 1 drop into both eyes 4 (four) times daily as needed (Dry eye).   zolpidem 10 MG tablet Commonly known as: AMBIEN Take 10 mg by mouth at bedtime.        Allergies Allergies  Allergen Reactions   Penicillins Swelling and Other (See Comments)    TONGUE SWELLING  PATIENT HAD A PCN REACTION WITH IMMEDIATE RASH, FACIAL/TONGUE/THROAT SWELLING, SOB, OR LIGHTHEADEDNESS WITH HYPOTENSION:  #  #  #  YES  #  #  #   Has patient had a PCN reaction causing severe rash involving mucus membranes or skin necrosis: No Has patient had a PCN reaction that required hospitalization: No Has patient had a PCN reaction occurring within the last 10 years: No    Statins     muscle pain   Lyrica [Pregabalin] Other (See Comments)    DRY MOUTH   Outstanding Labs/Studies   N/a   Duration of Discharge Encounter   Greater than 30 minutes including physician time.  Signed, Reino Bellis, NP 09/30/2021, 7:57 PM

## 2021-10-03 ENCOUNTER — Encounter (HOSPITAL_COMMUNITY): Payer: Self-pay | Admitting: Cardiology

## 2021-10-05 ENCOUNTER — Telehealth (HOSPITAL_COMMUNITY): Payer: Self-pay

## 2021-10-05 NOTE — Telephone Encounter (Signed)
Called patient to see if she is interested in the Cardiac Rehab Program. Patient expressed interest. Explained scheduling process and went over insurance, patient verbalized understanding. Will contact patient for scheduling once f/u has been completed. 

## 2021-10-05 NOTE — Telephone Encounter (Signed)
Pt insurance is active and benefits verified through Medicare a/b Co-pay 0, DED $233/$233 met, out of pocket 0/0 met, co-insurance 20%. no pre-authorization required. Passport, 10/05/2021'@2' :07pm, REF# [id number]  2ndary insurance is active and benefits verified through [facility]. Co-pay 0, DED 0/0 met, out of pocket 0/0 met, co-insurance 0. No pre-authorization required. Passport, 10/05/2021'@2' :24pm , REF# [id number]  Will contact patient to see if she is interested in the Cardiac Rehab Program. If interested, patient will need to complete follow up appt. Once completed, patient will be contacted for scheduling upon review by the RN Navigator.

## 2021-10-11 DIAGNOSIS — E782 Mixed hyperlipidemia: Secondary | ICD-10-CM | POA: Diagnosis not present

## 2021-10-11 DIAGNOSIS — I10 Essential (primary) hypertension: Secondary | ICD-10-CM | POA: Diagnosis not present

## 2021-10-11 DIAGNOSIS — M859 Disorder of bone density and structure, unspecified: Secondary | ICD-10-CM | POA: Diagnosis not present

## 2021-10-11 DIAGNOSIS — H04123 Dry eye syndrome of bilateral lacrimal glands: Secondary | ICD-10-CM | POA: Diagnosis not present

## 2021-10-13 DIAGNOSIS — F341 Dysthymic disorder: Secondary | ICD-10-CM | POA: Diagnosis not present

## 2021-10-13 DIAGNOSIS — G4709 Other insomnia: Secondary | ICD-10-CM | POA: Diagnosis not present

## 2021-10-13 DIAGNOSIS — M5137 Other intervertebral disc degeneration, lumbosacral region: Secondary | ICD-10-CM | POA: Diagnosis not present

## 2021-10-13 DIAGNOSIS — M47817 Spondylosis without myelopathy or radiculopathy, lumbosacral region: Secondary | ICD-10-CM | POA: Diagnosis not present

## 2021-10-13 DIAGNOSIS — G894 Chronic pain syndrome: Secondary | ICD-10-CM | POA: Diagnosis not present

## 2021-10-13 DIAGNOSIS — M87059 Idiopathic aseptic necrosis of unspecified femur: Secondary | ICD-10-CM | POA: Diagnosis not present

## 2021-10-13 DIAGNOSIS — F4312 Post-traumatic stress disorder, chronic: Secondary | ICD-10-CM | POA: Diagnosis not present

## 2021-10-16 NOTE — Progress Notes (Signed)
Cardiology Clinic Note   Patient Name: Brenda Carney Date of Encounter: 10/19/2021  Primary Care Provider:  Michael Boston, MD Primary Cardiologist:  Glenetta Hew, MD  Patient Profile    Brenda Carney 70 year old female presents the clinic today for follow-up evaluation of her essential hypertension and coronary artery disease  Past Medical History    Past Medical History:  Diagnosis Date   Arthritis    "thumbs; hips" (05/14/2017)   Cerebral aneurysm    Right ophthalmic artery, left callosal marginal anterior cerebral artery branch   Cerebrovascular disease    Carotid dopplers which showed no significant increase in velocities, extremely minimal on the left, antegrade vertebral bilaterally.   Cervical spondylosis    Chronic lower back pain    COPD (chronic obstructive pulmonary disease) (HCC)    "little bit" (05/14/2017)   Coronary artery disease, non-occlusive    Coronary calcium scoring: June 2018: Score 1106. 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   Pneumonia 2020   Sleep apnea    does not wear CPAP; "think it was medication related" (05/14/2017)   Stroke [facility]) 2010   TIA   Thoracic aortic ectasia (HCC)    ~40 mm on Coronary Calcium Score CT - recommend f/u CTA or MRA   TIA (transient ischemic attack) 09/2011   "several"   Past Surgical History:  Procedure Laterality Date   ABDOMINAL AORTOGRAM W/LOWER EXTREMITY N/A 05/14/2017    Procedure: Abdominal Aortogram w/Lower Extremity;  Surgeon: Conrad Kahuku, MD;  Location: [place] CV [facility];  Service: Cardiovascular;  Laterality: N/A;   ACROMIO-CLAVICULAR JOINT REPAIR Left 1990s   "I think it was called an [facility] joint removal"   BACK SURGERY     BYPASS GRAFT POPLITEAL TO POPLITEAL Right 05/15/2017   Procedure: BYPASS GRAFT ABOVE KNEE POPLITEAL TO BELOW KNEE POPLITEAL ARTERY;  Surgeon: Conrad Cecil, MD;  Location: [place];  Service: Vascular;  Laterality: Right;   CORONARY STENT INTERVENTION N/A 09/30/2021   Procedure: CORONARY STENT INTERVENTION;  Surgeon: Leonie Man, MD;  Location: [place] [facility];  Service: Cardiovascular;  Laterality: N/A;   ESOPHAGOGASTRODUODENOSCOPY (EGD) WITH PROPOFOL N/A 04/29/2018   Procedure: ESOPHAGOGASTRODUODENOSCOPY (EGD) WITH PROPOFOL;  Surgeon: Otis Brace, MD;  Location: [place];  Service: Gastroenterology;  Laterality: N/A;   FOOT FRACTURE SURGERY Bilateral    multiple procedures   FRACTURE SURGERY     INTRAVASCULAR ULTRASOUND/IVUS N/A 09/30/2021   Procedure: Intravascular Ultrasound/IVUS;  Surgeon: Leonie Man, MD;  Location: [place] [facility];  Service: Cardiovascular;  Laterality: N/A;   JOINT REPLACEMENT     LEFT HEART CATH AND CORONARY ANGIOGRAPHY N/A 09/30/2021   Procedure: LEFT HEART CATH AND CORONARY ANGIOGRAPHY;  Surgeon: Leonie Man, MD;  Location: [place] [facility];  Service: Cardiovascular;  Laterality: N/A;   LOWER EXTREMITY ANGIOGRAPHY N/A 06/16/2020   Procedure: LOWER EXTREMITY  ANGIOGRAPHY;  Surgeon: [name], MD;  Location: [place] [facility];  Service: Cardiovascular;  Laterality: N/A;   MAXIMUM ACCESS (MAS)POSTERIOR LUMBAR INTERBODY FUSION (PLIF) 1 LEVEL N/A 09/30/2014   Procedure: FOR MAXIMUM ACCESS (MAS) POSTERIOR LUMBAR INTERBODY FUSION (PLIF) 1 LEVEL;  Surgeon: Eustace Moore, MD;  Location: [place] [facility];  Service: Neurosurgery;  Laterality: N/A;  FOR MAXIMUM ACCESS (MAS) POSTERIOR LUMBAR  INTERBODY FUSION (PLIF) 1 LEVEL LUMBAR 4-5   MULTIPLE TOOTH EXTRACTIONS Right 04/2017   NM MYOVIEW LTD  06/2017    Normal EF, 66%. No ST changes. Normal study. LOW RISK.   PERIPHERAL VASCULAR ATHERECTOMY Left 06/16/2020   Procedure: PERIPHERAL VASCULAR ATHERECTOMY;  Surgeon: [name], MD;  Location: [place] [facility];  Service: Cardiovascular;  Laterality: Left;  SFA   TOTAL HIP ARTHROPLASTY Right 01/16/2020   Procedure: RIGHT TOTAL HIP ARTHROPLASTY ANTERIOR APPROACH;  Surgeon: Mcarthur Rossetti, MD;  Location: [facility];  Service: Orthopedics;  Laterality: Right;   TOTAL HIP ARTHROPLASTY Left 04/30/2020   Procedure: LEFT TOTAL HIP ARTHROPLASTY ANTERIOR APPROACH;  Surgeon: Mcarthur Rossetti, MD;  Location: [facility];  Service: Orthopedics;  Laterality: Left;   TRANSTHORACIC ECHOCARDIOGRAM  09/07/2013   Done to evaluate TIA: Normal LV size and function. EF 55-60%. GR 1 DD. Mild left atrial dilation. Mildly calcified mitral leaflets. Otherwise normal.    Allergies  Allergies  Allergen Reactions   Penicillins Swelling and Other (See Comments)    TONGUE SWELLING  PATIENT HAD A PCN REACTION WITH IMMEDIATE RASH, FACIAL/TONGUE/THROAT SWELLING, SOB, OR LIGHTHEADEDNESS WITH HYPOTENSION:  #  #  #  YES  #  #  #   Has patient had a PCN reaction causing severe rash involving mucus membranes or skin necrosis: No Has patient had a PCN reaction that required hospitalization: No Has patient had a PCN reaction occurring within the last 10 years: No    Statins     muscle pain   Lyrica [Pregabalin] Other (See Comments)    DRY MOUTH    History of Present Illness    Brenda Carney has a PMH of elevated coronary calcium score greater than 1000, nonischemic nuclear stress test), peripheral arterial disease (history of right femoropopliteal bypass and right SFA PTA), hypertension, hyperlipidemia on Repatha, and emphysema.  She is a former heavy smoker and stopped smoking last year.  She  presented to the office to see Dr. Ellyn Hack to discuss results of her coronary CTA with positive CT FFR of OM1.  At that time cardiac catheterization was discussed and scheduled for further evaluation.  She underwent cardiac catheterization on 09/30/2021 which showed proximal circumflex 45% with a 30% stenosed first marginal branch, proximal circumflex to mid circumflex lesion 80% stenosed, mid circumflex lesion 50% stenosed.  She received PCI with DES to the lesion.  She was also noted to have proximal RCA lesion 30%, proximal RCA to lesion 70%, proximal-mid RCA lesion 90% with irregular/ulcerated plaque.  Dissection also received PCI with DES.  She was noted to have distal RCA lesion 60%, mild diffuse LAD disease, hyperdynamic left ventricular systolic function, LVEF greater than 65% and normal LVEDP.  She was discharged on amlodipine 10 mg, aspirin, Plavix, lisinopril, Protonix, and Repatha.  She presents the clinic today for follow-up evaluation states she feels well.  She reports that she has more energy and her breathing has improved.  She is planning on increasing her physical activity and would like to walk her dogs again.  We  reviewed her angiography and she expressed understanding.  Her right radial cath site is well-healed with no signs of infection.  I will continue her current medication regimen, give her the salty 6 diet sheet, have her improved physical activity/increase, and plan follow-up in 3 months.   Today she denies chest pain, shortness of breath, lower extremity edema, fatigue, palpitations, melena, hematuria, hemoptysis, diaphoresis, weakness, presyncope, syncope, orthopnea, and PND.   Home Medications    Prior to Admission medications   Medication Sig Start Date End Date Taking? Authorizing Provider  albuterol (VENTOLIN HFA) 108 (90 Base) MCG/ACT inhaler Inhale 2 puffs into the lungs every 4 (four) hours as needed for wheezing or shortness of breath.    [provider]   amLODipine (NORVASC) 10 MG tablet Take 1 tablet (10 mg total) by mouth daily. 06/18/19   Hall, Carole N, DO  ANORO ELLIPTA 62.5-25 MCG/INH AEPB Inhale 2 puffs into the lungs daily as needed (wheezing).  02/18/19   [provider]  ARIPiprazole (ABILIFY) 2 MG tablet Take 2 mg by mouth daily.    [provider]  aspirin EC 81 MG tablet Take 81 mg by mouth daily. Swallow whole.    [provider]  buPROPion (WELLBUTRIN XL) 150 MG 24 hr tablet Take 150 mg by mouth daily. 04/04/18   [provider]  Calcium Carbonate-Vitamin D (CALCIUM-VITAMIN D3 PO) Take 1 tablet by mouth daily. 1200 mg / 5000 units    [provider]  Carboxymethylcellulose Sodium (THERATEARS) 0.25 % SOLN Place 1 drop into both eyes 4 (four) times daily as needed (dry eye).    [provider]  clopidogrel (PLAVIX) 75 MG tablet Take 1 tablet (75 mg total) by mouth daily. 08/11/21   [name], MD  desvenlafaxine (PRISTIQ) 100 MG 24 hr tablet Take 100 mg by mouth daily.    [provider]  Estradiol 10 MCG TABS vaginal tablet Place 10 mcg vaginally once a week.  05/27/19   [provider]  lisinopril (ZESTRIL) 10 MG tablet Take 10 mg by mouth daily. 03/22/20   [provider]  Multiple Vitamin (MULTIVITAMIN WITH MINERALS) TABS tablet Take 1 tablet by mouth daily. One-A-Day New Chapter    [provider]  naphazoline-pheniramine (VISINE) 0.025-0.3 % ophthalmic solution Place 1 drop into both eyes 4 (four) times daily as needed (Dry eye).    [provider]  oxyCODONE (OXY IR/ROXICODONE) 5 MG immediate release tablet Take 1-2 tablets (5-10 mg total) by mouth every 6 (six) hours as needed for moderate pain (pain score 4-6). Patient taking differently: Take 10 mg by mouth in the morning, at noon, in the evening, and at bedtime. 05/04/20   Mcarthur Rossetti, MD  pantoprazole (PROTONIX) 40 MG tablet Take 40 mg by mouth every morning.  07/20/21   [provider]  Propylene Glycol (SYSTANE COMPLETE) 0.6 % SOLN Place 1 drop into both eyes 4 (four) times daily as needed (Dry eye).    [provider]  REPATHA SURECLICK 381 MG/ML SOAJ INJECT CONTENTS OF 1 PEN SUBCUTANEOUSLY EVERY 14 DAYS. 09/05/21   Leonie Man, MD  traZODone (DESYREL) 50 MG tablet Take 50 mg by mouth at bedtime. 02/10/20   [provider]  zolpidem (AMBIEN) 10 MG tablet Take 10 mg by mouth at bedtime.     [provider]    Family History    Family History  Problem Relation Age of Onset   Stroke Father  Heart attack Mother    Stroke Mother    She indicated that her mother is deceased. She indicated that her father is deceased.  Social History    Social History   Socioeconomic History   Marital status: Widowed    Spouse name: [name]    Number of children: 0   Years of education: COLLEGE   Highest education level: Not on file  Occupational History   Occupation: Horse Breeder   Tobacco Use   Smoking status: Former    Packs/day: 0.02    Years: 36.00    Pack years: 0.72    Types: Cigarettes    Quit date: 04/24/2017    Years since quitting: 4.4   Smokeless tobacco: Never  Vaping Use   Vaping Use: Former  Substance and Sexual Activity   Alcohol use: Yes    Alcohol/week: 3.0 standard drinks    Types: 3 Glasses of wine per week   Drug use: No   Sexual activity: Never  Other Topics Concern   Not on file  Social History Narrative   Patient lives at home with her husband [name]. Patient has no children.    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    Patient has BA degree.    Smokes 1*-2 cigarettes /day, but not every day-- former heavy smoker (failed "quitting") -- now says she has truly "QUIT" - however now is been going back and forth between quitting and cheating and probably smokes anywhere from 0-3 cigarettes a day.   Social Determinants of Health   Financial Resource Strain: Not  on file  Food Insecurity: Not on file  Transportation Needs: Not on file  Physical Activity: Not on file  Stress: Not on file  Social Connections: Not on file  Intimate Partner Violence: Not on file     Review of Systems    General:  No chills, fever, night sweats or weight changes.  Cardiovascular:  No chest pain, dyspnea on exertion, edema, orthopnea, palpitations, paroxysmal nocturnal dyspnea. Dermatological: No rash, lesions/masses Respiratory: No cough, dyspnea Urologic: No hematuria, dysuria Abdominal:   No nausea, vomiting, diarrhea, bright red blood per rectum, melena, or hematemesis Neurologic:  No visual changes, wkns, changes in mental status. All other systems reviewed and are otherwise negative except as noted above.  Physical Exam    VS:  BP 134/86   Pulse 95   Ht 5\' 5"  (1.651 m)   Wt 122 lb 9.6 oz (55.6 kg)   SpO2 97%   BMI 20.40 kg/m  , BMI Body mass index is 20.4 kg/m. GEN: Well nourished, well developed, in no acute distress. HEENT: normal. Neck: Supple, no JVD, carotid bruits, or masses. Cardiac: RRR, no murmurs, rubs, or gallops. No clubbing, cyanosis, edema.  Radials/DP/PT 2+ and equal bilaterally.  Respiratory:  Respirations regular and unlabored, clear to auscultation bilaterally. GI: Soft, nontender, nondistended, BS + x 4. MS: no deformity or atrophy. Skin: warm and dry, no rash. Neuro:  Strength and sensation are intact. Psych: Normal affect.  Accessory Clinical Findings    Recent Labs: 09/08/2021: ALT 15; BUN 7; Creatinine, Ser 0.73; Potassium 3.5; Sodium 138 09/22/2021: Hemoglobin 17.1; Platelets 385   Recent Lipid Panel    Component Value Date/Time   CHOL 153 09/08/2021 1334   TRIG 116 09/08/2021 1334   HDL 79 09/08/2021 1334   CHOLHDL 1.9 09/08/2021 1334   CHOLHDL 5.3 05/15/2017 0307   VLDL 34 05/15/2017 0307   LDLCALC 54  09/08/2021 1334   LDLDIRECT 186 (H) 03/30/2015 1545    ECG personally reviewed by me today-normal sinus  rhythm with sinus arrhythmia possible left atrial enlargement septal infarct undetermined age 70 bpm- No acute changes  Echocardiogram 09/06/2013  Study Conclusions   - Procedure narrative: Transthoracic echocardiography.    Technically difficult study with suboptimal image quality.  - Left ventricle: The cavity size was normal. Wall thickness    was normal. Systolic function was normal. The estimated    ejection fraction was in the range of 55% to 60%. Doppler    parameters are consistent with abnormal left ventricular    relaxation (grade 1 diastolic dysfunction). The E/e' ratio    is ~10, suggesting borderline increased LV filling    pressure.  - Mitral valve: Calcified annulus. Mildly thickened leaflets    . Trivial regurgitation.  - Left atrium: The atrium was mildly dilated. Volume 32    ml/m2.  - Atrial septum: Appears thickened. No defect or patent    foramen ovale was identified by color doppler.  - Inferior vena cava: The vessel was normal in size; the    respirophasic diameter changes were in the normal range (=    50%); findings are consistent with normal central venous    pressure.   Cardiac catheterization 09/30/2021   LESION SEGMENT #1: Prox Cx lesion is 45% stenosed with 30% stenosed side branch in 1st Mrg.  Prox Cx to Mid Cx lesion is 80% stenosed. Mid Cx lesion is 50% stenosed.   A drug-eluting stent was successfully placed (based on IVUS guidance) covering all 3 lesions segments, using a STENT ONYX FRONTIER 3.0X34.  Postdilated to 3.3 mm   Post intervention, there is a 0% residual stenosis throughout the entire segment. Post intervention, the small caliber OM1 side branch was reduced to 30% residual stenosis.   ------------------------------------------------------   LESION SEGMENT #2 prox RCA-1 lesion is 30% stenosed.  Prox RCA-2 lesion is 70% stenosed. Prox RCA to Mid RCA lesion is 90% stenosed (irregular/ulcerated plaque).   A drug-eluting stent was successfully  placed covering the entire diseased segment, using a STENT ONYX FRONTIER 3.5X30.  Postdilated from 4.1 down to 3.7 mm.   Post intervention, there is a 0% residual stenosis throughout the stented segment.Marland Kitchen   -------------------------------------------------------   Dist RCA lesion is 60% stenosed.   Mild diffuse LAD disease   Mid Cx lesion is 50% stenosed.   Prox RCA to Mid RCA lesion is 90% stenosed.   -------------------------------------------------------   Post intervention, there is a 0% residual stenosis.   Post intervention, the side branch was reduced to 30% residual stenosis.   There is hyperdynamic left ventricular systolic function.  The left ventricular ejection fraction is greater than 65% by visual estimate.   LV end diastolic pressure is normal.   SUMMARY Severe two-vessel CAD: Consistent with cardiac CTA, mid LCx focal 80% stenosis, but with IVUS there appeared to be more diffuse disease ranging from 40 to 80% with 60 to 75% circumferential mixed calcified and noncalcified plaque burden Successful IVUS guided DES PCI of the proximal to mid LCx using Onyx Frontier DES 3.0 mm x 34 mm deployed to 3.3 mm.  Reducing the stenosis to 0% with TIMI-3 flow pre and post Not seen on CTA was a proximal to mid ulcerated 70-90% irregular plaque just after the first bend that had appearance of a recently ruptured plaque.  There is downstream 60% stenosis that does not appear flow-limiting. Successful DES PCI of  the proximal to mid RCA using Onyx Frontier DES 3.5 mm x 30 mm postdilated in tapered fashion from 4.73mm to 3.7 mm.  Reduced stenosis to 0% with TIMI-3 flow pre and post Normal LAD that is relatively diminutive and does not reach the apex as the PDA is wraparound. Hyperdynamic left ventricular function with normal EF and normal pressures.   RECOMMENDATIONS She is already on aspirin and Plavix.  Should be fine for same-day discharge. Continue PCSK9 inhibitor for lipids.  Statin  intolerant. Continue calcium channel blocker in lieu of beta-blocker along with ACE inhibitor. Follow-up with Dr. Ellyn Hack or APP        Glenetta Hew, MD Diagnostic Dominance: Right Intervention    Assessment & Plan   1.  Coronary artery disease-no chest pain today.  Underwent cardiac catheterization 09/30/2021 which showed distal RCA lesion 60%, mild diffuse LAD disease, mid circumflex lesion 50% stenosed, proximal-mid RCA lesion 90% stenosed.  She received PCI with DES x2 to her circumflex and RCA.  Details above. Continue aspirin, Plavix, Repatha, amlodipine, lisinopril Heart healthy low-sodium diet-salty 6 given Increase physical activity as tolerated  Hyperlipidemia-09/08/2021: Cholesterol, Total 153; HDL 79; LDL Chol Calc (NIH) 54; Triglycerides 116 Continue aspirin, Repatha Heart healthy low-sodium high-fiber diet Increase physical activity as tolerated  Essential hypertension-BP today 134/86.  Well-controlled at home. Continue amlodipine, lisinopril Heart healthy low-sodium diet-salty 6 given Increase physical activity as tolerated  AKI-creatinine 0.73 on 09/08/2021. Follows with PCP  COPD-no increased work of breathing today. Follows with PCP  Tobacco abuse-former heavy smoker quit 2021.  Discussed importance of continued cessation. Congratulated on cessation  Disposition: Follow-up with Dr. Ellyn Hack in 3-4 months.  Jossie Ng. Raynor Calcaterra NP-C    10/19/2021, 9:26 AM [address] Office [phone] Fax [phone]  Notice: This dictation was prepared with Dragon dictation along with smaller phrase technology. Any transcriptional errors that result from this process are unintentional and may not be corrected upon review.  I spent 13 minutes examining this patient, reviewing medications, and using patient centered shared decision making involving her cardiac care.  Prior to her visit I spent greater than 20 minutes  reviewing her past medical history,  medications, and prior cardiac tests.

## 2021-10-18 DIAGNOSIS — I2510 Atherosclerotic heart disease of native coronary artery without angina pectoris: Secondary | ICD-10-CM | POA: Diagnosis not present

## 2021-10-18 DIAGNOSIS — Z1331 Encounter for screening for depression: Secondary | ICD-10-CM | POA: Diagnosis not present

## 2021-10-18 DIAGNOSIS — I719 Aortic aneurysm of unspecified site, without rupture: Secondary | ICD-10-CM | POA: Diagnosis not present

## 2021-10-18 DIAGNOSIS — M8589 Other specified disorders of bone density and structure, multiple sites: Secondary | ICD-10-CM | POA: Diagnosis not present

## 2021-10-18 DIAGNOSIS — F325 Major depressive disorder, single episode, in full remission: Secondary | ICD-10-CM | POA: Diagnosis not present

## 2021-10-18 DIAGNOSIS — Z0000 Encounter for general adult medical examination without abnormal findings: Secondary | ICD-10-CM | POA: Diagnosis not present

## 2021-10-18 DIAGNOSIS — G729 Myopathy, unspecified: Secondary | ICD-10-CM | POA: Diagnosis not present

## 2021-10-18 DIAGNOSIS — Z23 Encounter for immunization: Secondary | ICD-10-CM | POA: Diagnosis not present

## 2021-10-18 DIAGNOSIS — J449 Chronic obstructive pulmonary disease, unspecified: Secondary | ICD-10-CM | POA: Diagnosis not present

## 2021-10-18 DIAGNOSIS — I10 Essential (primary) hypertension: Secondary | ICD-10-CM | POA: Diagnosis not present

## 2021-10-18 DIAGNOSIS — E782 Mixed hyperlipidemia: Secondary | ICD-10-CM | POA: Diagnosis not present

## 2021-10-18 DIAGNOSIS — Z1339 Encounter for screening examination for other mental health and behavioral disorders: Secondary | ICD-10-CM | POA: Diagnosis not present

## 2021-10-18 DIAGNOSIS — I739 Peripheral vascular disease, unspecified: Secondary | ICD-10-CM | POA: Diagnosis not present

## 2021-10-19 ENCOUNTER — Encounter: Payer: Self-pay | Admitting: General Practice

## 2021-10-19 ENCOUNTER — Ambulatory Visit (INDEPENDENT_AMBULATORY_CARE_PROVIDER_SITE_OTHER): Payer: Medicare Other | Admitting: General Practice

## 2021-10-19 ENCOUNTER — Other Ambulatory Visit: Payer: Self-pay

## 2021-10-19 VITALS — BP 134/86 | HR 95 | Ht 65.0 in | Wt 122.6 lb

## 2021-10-19 DIAGNOSIS — J449 Chronic obstructive pulmonary disease, unspecified: Secondary | ICD-10-CM | POA: Diagnosis not present

## 2021-10-19 DIAGNOSIS — N179 Acute kidney failure, unspecified: Secondary | ICD-10-CM

## 2021-10-19 DIAGNOSIS — I2510 Atherosclerotic heart disease of native coronary artery without angina pectoris: Secondary | ICD-10-CM

## 2021-10-19 DIAGNOSIS — E785 Hyperlipidemia, unspecified: Secondary | ICD-10-CM

## 2021-10-19 DIAGNOSIS — I10 Essential (primary) hypertension: Secondary | ICD-10-CM

## 2021-10-19 DIAGNOSIS — Z720 Tobacco use: Secondary | ICD-10-CM

## 2021-10-19 NOTE — Patient Instructions (Signed)
Medication Instructions:  The current medical regimen is effective;  continue present plan and medications as directed. Please refer to the Current Medication list given to you today.   *If you need a refill on your cardiac medications before your next appointment, please call your pharmacy*  Lab Work:   Testing/Procedures:  NONE    NONE  Special Instructions PLEASE READ AND FOLLOW SALTY 6-ATTACHED-1,800mg  daily  PLEASE INCREASE PHYSICAL ACTIVITY AS TOLERATED, GOAL 150 MINUTES OF MODERATED PHYSICAL ACTIVITY WEEKLY. INCREASE SLOWLY.  PLEASE READ AND FOLLOW INCREASED FIBER DIET-ATTACHED  Follow-Up: Your next appointment:  3-4 month(s) In Person with Glenetta Hew, MD OR IF UNAVAILABLE [place], FNP-C   At [facility], you and your health needs are our priority.  As part of our continuing mission to provide you with exceptional heart care, we have created designated Provider Care Teams.  These Care Teams include your primary Cardiologist (physician) and Advanced Practice Providers (APPs -  Physician Assistants and Nurse Practitioners) who all work together to provide you with the care you need, when you need it.            6 SALTY THINGS TO AVOID     1,800MG  DAILY     High-Fiber Eating Plan Fiber, also called dietary fiber, is a type of carbohydrate. It is found foods such as fruits, vegetables, whole grains, and beans. A high-fiber diet can have many health benefits. Your health care provider may recommend a high-fiber diet to help: Prevent constipation. Fiber can make your bowel movements more regular. Lower your cholesterol. Relieve the following conditions: Inflammation of veins in the anus (hemorrhoids). Inflammation of specific areas of the digestive tract (uncomplicated diverticulosis). A problem of the large intestine, also called the colon, that sometimes causes pain and diarrhea (irritable bowel syndrome, or IBS). Prevent overeating as part of a weight-loss  plan. Prevent heart disease, type 2 diabetes, and certain cancers. What are tips for following this plan? Reading food labels  Check the nutrition facts label on food products for the amount of dietary fiber. Choose foods that have 5 grams of fiber or more per serving. The goals for recommended daily fiber intake include: Men (age 63 or younger): 34-38 g. Men (over age 72): 28-34 g. Women (age 30 or younger): 25-28 g. Women (over age 60): 22-25 g.  Shopping Choose whole fruits and vegetables instead of processed forms, such as apple juice or applesauce. Choose a wide variety of high-fiber foods such as avocados, lentils, oats, and kidney beans. Read the nutrition facts label of the foods you choose. Be aware of foods with added fiber. These foods often have high sugar and sodium amounts per serving. Cooking Use whole-grain flour for baking and cooking. Cook with brown rice instead of white rice. Meal planning Start the day with a breakfast that is high in fiber, such as a cereal that contains 5 g of fiber or more per serving. Eat breads and cereals that are made with whole-grain flour instead of refined flour or white flour. Eat brown rice, bulgur wheat, or millet instead of white rice. Use beans in place of meat in soups, salads, and pasta dishes. Be sure that half of the grains you eat each day are whole grains. General information You can get the recommended daily intake of dietary fiber by: Eating a variety of fruits, vegetables, grains, nuts, and beans. Taking a fiber supplement if you are not able to take in enough fiber in your diet. It is better to  get fiber through food than from a supplement. Gradually increase how much fiber you consume. If you increase your intake of dietary fiber too quickly, you may have bloating, cramping, or gas. Drink plenty of water to help you digest fiber. Choose high-fiber snacks, such as berries, raw vegetables, nuts, and popcorn. What foods  should I eat? Fruits Berries. Pears. Apples. Oranges. Avocado. Prunes and raisins. Dried figs. Vegetables Sweet potatoes. Spinach. Kale. Artichokes. Cabbage. Broccoli. Cauliflower. Green peas. Carrots. Squash. Grains Whole-grain breads. Multigrain cereal. Oats and oatmeal. Brown rice. Barley. Bulgur wheat. [place]. Quinoa. Bran muffins. Popcorn. Rye wafer crackers. Meats and other proteins Navy beans, kidney beans, and pinto beans. Soybeans. Split peas. Lentils. Nuts and seeds. Dairy Fiber-fortified yogurt. Beverages Fiber-fortified soy milk. Fiber-fortified orange juice. Other foods Fiber bars. The items listed above may not be a complete list of recommended foods and beverages. Contact a dietitian for more information. What foods should I avoid? Fruits Fruit juice. Cooked, strained fruit. Vegetables Fried potatoes. Canned vegetables. Well-cooked vegetables. Grains White bread. Pasta made with refined flour. White rice. Meats and other proteins Fatty cuts of meat. Fried chicken or fried fish. Dairy Milk. Yogurt. Cream cheese. Sour cream. Fats and oils Butters. Beverages Soft drinks. Other foods Cakes and pastries. The items listed above may not be a complete list of foods and beverages to avoid. Talk with your dietitian about what choices are best for you. Summary Fiber is a type of carbohydrate. It is found in foods such as fruits, vegetables, whole grains, and beans. A high-fiber diet has many benefits. It can help to prevent constipation, lower blood cholesterol, aid weight loss, and reduce your risk of heart disease, diabetes, and certain cancers. Increase your intake of fiber gradually. Increasing fiber too quickly may cause cramping, bloating, and gas. Drink plenty of water while you increase the amount of fiber you consume. The best sources of fiber include whole fruits and vegetables, whole grains, nuts, seeds, and beans. This information is not intended to replace  advice given to you by your health care provider. Make sure you discuss any questions you have with your health care provider. Document Revised: 04/15/2020 Document Reviewed: 04/15/2020 Elsevier Patient Education  2022 [employer].

## 2021-10-26 DIAGNOSIS — H903 Sensorineural hearing loss, bilateral: Secondary | ICD-10-CM | POA: Diagnosis not present

## 2021-11-01 ENCOUNTER — Ambulatory Visit: Payer: Medicare Other | Admitting: Cardiology

## 2021-11-06 ENCOUNTER — Other Ambulatory Visit: Payer: Self-pay | Admitting: Vascular Surgery

## 2021-11-11 ENCOUNTER — Telehealth (HOSPITAL_COMMUNITY): Payer: Self-pay

## 2021-11-11 NOTE — Telephone Encounter (Signed)
Called patient to see if she was interested in participating in the Cardiac Rehab Program. Patient stated yes. Patient will come in for orientation on 11/29/21 @ 930AM and will attend the 1:00PM exercise class.   Tourist information centre manager.

## 2021-11-24 ENCOUNTER — Telehealth (HOSPITAL_COMMUNITY): Payer: Self-pay

## 2021-11-24 DIAGNOSIS — M961 Postlaminectomy syndrome, not elsewhere classified: Secondary | ICD-10-CM | POA: Diagnosis not present

## 2021-11-24 DIAGNOSIS — M5137 Other intervertebral disc degeneration, lumbosacral region: Secondary | ICD-10-CM | POA: Diagnosis not present

## 2021-11-24 DIAGNOSIS — M87059 Idiopathic aseptic necrosis of unspecified femur: Secondary | ICD-10-CM | POA: Diagnosis not present

## 2021-11-24 DIAGNOSIS — M47817 Spondylosis without myelopathy or radiculopathy, lumbosacral region: Secondary | ICD-10-CM | POA: Diagnosis not present

## 2021-11-24 NOTE — Telephone Encounter (Signed)
Successful telephone encounter to patient to confirm cardiac rehab orientation appointment for Tuesday 11/29/21 at 9:30 am. Patient currently at [facility] for routine appointment and only able to complete partial health history. She will be called back to complete. She is provided directions to appointment. Patient appreciative of call.

## 2021-11-25 ENCOUNTER — Telehealth (HOSPITAL_COMMUNITY): Payer: Self-pay | Admitting: *Deleted

## 2021-11-25 NOTE — Telephone Encounter (Signed)
Spoke with [name]. Completed health history for cardiac rehab orientation on 11/29/21.Barnet Pall, RN,BSN 11/25/2021 2:38 PM

## 2021-11-29 ENCOUNTER — Encounter (HOSPITAL_COMMUNITY): Payer: Self-pay

## 2021-11-29 ENCOUNTER — Encounter (HOSPITAL_COMMUNITY)
Admission: RE | Admit: 2021-11-29 | Discharge: 2021-11-29 | Disposition: A | Discharge status: Home / Self Care | Payer: Medicare Other | Source: Ambulatory Visit | Attending: Cardiology | Admitting: Cardiology

## 2021-11-29 ENCOUNTER — Other Ambulatory Visit: Payer: Self-pay

## 2021-11-29 VITALS — BP 122/84 | HR 98 | Ht 64.0 in | Wt 124.8 lb

## 2021-11-29 DIAGNOSIS — Z955 Presence of coronary angioplasty implant and graft: Secondary | ICD-10-CM | POA: Insufficient documentation

## 2021-11-29 NOTE — Progress Notes (Signed)
Cardiac Individual Treatment Plan  Patient Details  Name: Brenda Carney MRN: [id number] Date of Birth: 02-01-1951 Referring Provider:   Flowsheet Row CARDIAC REHAB PHASE II ORIENTATION from 11/29/2021 in [place]  Referring Provider Glenetta Hew, MD       Initial Encounter Date:  [place] PHASE II ORIENTATION from 11/29/2021 in [place]  Date 11/29/21       Visit Diagnosis: 09/30/21 S/P DES RCA, CFx  Patient's Home Medications on Admission:  Current Outpatient Medications:    albuterol (VENTOLIN HFA) 108 (90 Base) MCG/ACT inhaler, Inhale 2 puffs into the lungs every 4 (four) hours as needed for wheezing or shortness of breath., Disp: , Rfl:    amLODipine (NORVASC) 10 MG tablet, Take 1 tablet (10 mg total) by mouth daily., Disp: 30 tablet, Rfl: 0   ANORO ELLIPTA 62.5-25 MCG/INH AEPB, Inhale 2 puffs into the lungs daily as needed (wheezing). , Disp: , Rfl:    Apoaequorin (PREVAGEN) 10 MG CAPS, Take 10 mg by mouth daily., Disp: , Rfl:    ARIPiprazole (ABILIFY) 2 MG tablet, Take 2 mg by mouth daily., Disp: , Rfl:    aspirin EC 81 MG tablet, Take 81 mg by mouth daily. Swallow whole., Disp: , Rfl:    buPROPion (WELLBUTRIN XL) 150 MG 24 hr tablet, Take 150 mg by mouth daily., Disp: , Rfl: 1   Calcium Carbonate-Vitamin D (CALCIUM-VITAMIN D3 PO), Take 1 tablet by mouth daily. 1200 mg / 5000 units, Disp: , Rfl:    Carboxymethylcellulose Sodium (THERATEARS) 0.25 % SOLN, Place 1 drop into both eyes 4 (four) times daily as needed (dry eye)., Disp: , Rfl:    clopidogrel (PLAVIX) 75 MG tablet, Take 1 tablet by mouth daily., Disp: 90 tablet, Rfl: 0   desvenlafaxine (PRISTIQ) 100 MG 24 hr tablet, Take 100 mg by mouth daily., Disp: , Rfl:    Estradiol 10 MCG TABS vaginal tablet, Place 10 mcg vaginally once a week. , Disp: , Rfl:    ferrous sulfate 325 (65 FE) MG tablet, Take 325 mg by mouth daily with breakfast.,  Disp: , Rfl:    Krill Oil 1000 MG CAPS, Take 1,000 mg by mouth daily., Disp: , Rfl:    lisinopril (ZESTRIL) 10 MG tablet, Take 10 mg by mouth daily., Disp: , Rfl:    Multiple Vitamin (MULTIVITAMIN WITH MINERALS) TABS tablet, Take 1 tablet by mouth daily. One-A-Day [place], Disp: , Rfl:    naphazoline-pheniramine (VISINE) 0.025-0.3 % ophthalmic solution, Place 1 drop into both eyes 4 (four) times daily as needed (Dry eye)., Disp: , Rfl:    Oxycodone HCl 10 MG TABS, Take 10 mg by mouth in the morning, at noon, in the evening, and at bedtime., Disp: , Rfl:    Propylene Glycol (SYSTANE COMPLETE) 0.6 % SOLN, Place 1 drop into both eyes 4 (four) times daily as needed (Dry eye)., Disp: , Rfl:    Propylhexedrine (BENZEDREX) INHA, Place 1 each into the nose daily., Disp: , Rfl:    REPATHA SURECLICK 413 MG/ML SOAJ, INJECT CONTENTS OF 1 PEN SUBCUTANEOUSLY EVERY 14 DAYS., Disp: 2 mL, Rfl: 11   traZODone (DESYREL) 50 MG tablet, Take 50 mg by mouth at bedtime., Disp: , Rfl:    zolpidem (AMBIEN) 10 MG tablet, Take 10 mg by mouth at bedtime. 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   Cervical spondylosis    Chronic lower back pain    COPD (chronic obstructive pulmonary disease) (HCC)    "little bit" (05/14/2017)   Coronary artery disease, non-occlusive    Coronary calcium scoring: June 2018: Score 1106. 98 percentile for age -> LOW RISK MYOVIEW   Depression    Exercise-induced asthma with acute exacerbation    "only in very hot weather" (05/14/2017)   GERD (gastroesophageal reflux disease)    History of IBS    resovlved   Hx of multiple concussions    from  horse training and riding   Hyperlipidemia with target LDL less than 70    Mostly statin intolerant,. 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   Pneumonia 2020   Sleep apnea    does not wear CPAP; "think it was medication related" (05/14/2017)   Stroke [facility]) 2010   TIA   Thoracic aortic ectasia (HCC)    ~40 mm on Coronary Calcium Score CT - recommend f/u CTA or MRA   TIA (transient ischemic attack) 09/2011   "several"    Tobacco Use: Social History   Tobacco Use  Smoking Status Some Days   Packs/day: 0.00   Years: 36.00   Pack years: 0.00   Types: Cigarettes  Smokeless Tobacco Never  Tobacco Comments   [name] says she smokes one cigarette every couple of weeks. [name] says she has a lot of friends who still smoke    Labs: Recent Review Flowsheet Data     Labs for ITP Cardiac and Pulmonary Rehab Latest Ref Rng & Units 05/15/2017 04/28/2018 08/13/2019 06/16/2020 09/08/2021   Cholestrol 100 - 199 mg/dL 251(H) - 205(H) - 153   LDLCALC 0 - 99 mg/dL 170(H) - 84 - 54   LDLDIRECT mg/dL - - - - -   HDL >39 mg/dL 47 - 61 - 79   Trlycerides 0 - 149 mg/dL 168(H) - 301(H) - 116   Hemoglobin A1c 4.8 - 5.6 % - - - - 5.4   TCO2 22 - 32 mmol/L - 23 - 27 -       Capillary Blood Glucose: Lab Results  Component Value Date   GLUCAP 109 (H) 04/30/2018   GLUCAP 110 (H) 04/29/2018   GLUCAP 107 (H) 09/05/2013   GLUCAP 110 (H) 10/01/2011     Exercise Target Goals: Exercise Program Goal: Individual exercise prescription set using results from initial 6 min walk test and THRR while considering  patient's activity barriers and safety.   Exercise Prescription Goal: Starting with aerobic activity 30 plus minutes a day, 3 days per week for initial exercise prescription. Provide home exercise prescription and guidelines that  participant acknowledges understanding prior to discharge.  Activity Barriers & Risk Stratification:  Activity Barriers & Cardiac Risk Stratification - 11/29/21 1218       Activity Barriers & Cardiac Risk Stratification   Activity Barriers Back Problems;Left Hip Replacement;Right Hip Replacement;Neck/Spine Problems;Joint Problems;Shortness of Breath;Balance Concerns    Cardiac Risk Stratification High             6  Minute Walk:  6 Minute Walk     Row Name 11/29/21 1043         6 Minute Walk   Phase Initial     Distance 1508 feet     Walk Time 6 minutes     # of Rest Breaks 0     MPH 2.86     METS 3.7     RPE 12     Perceived Dyspnea  0     VO2 Peak 12.96     Symptoms Yes (comment)     Comments 5/10 low back pain on enrty. No back pain during walk test, however 7/10 left hip pain     Resting HR 96 bpm     Resting BP 122/84     Resting Oxygen Saturation  99 %     Exercise Oxygen Saturation  during 6 min walk 98 %     Max Ex. HR 112 bpm     Max Ex. BP 138/86     2 Minute Post BP 128/84              Oxygen Initial Assessment:   Oxygen Re-Evaluation:   Oxygen Discharge (Final Oxygen Re-Evaluation):   Initial Exercise Prescription:  Initial Exercise Prescription - 11/29/21 1200       Date of Initial Exercise RX and Referring Provider   Date 11/29/21    Referring Provider Glenetta Hew, MD    Expected Discharge Date 01/27/22      NuStep   Level 2    SPM 75    Minutes 30    METs 2.5      Prescription Details   Frequency (times per week) 3    Duration Progress to 30 minutes of continuous aerobic without signs/symptoms of physical distress      Intensity   THRR 40-80% of Max Heartrate 60-120    Ratings of Perceived Exertion 11-13    Perceived Dyspnea 0-4      Progression   Progression Continue progressive overload as per policy without signs/symptoms or physical distress.      Resistance Training   Training Prescription Yes    Weight 2 lbs     Reps 10-15             Perform Capillary Blood Glucose checks as needed.  Exercise Prescription Changes:   Exercise Comments:   Exercise Goals and Review:   Exercise Goals     Row Name 11/29/21 1220             Exercise Goals   Increase Physical Activity Yes       Intervention Provide advice, education, support and counseling about physical activity/exercise needs.;Develop an individualized exercise prescription for aerobic and resistive training based on initial evaluation findings, risk stratification, comorbidities and participant's personal goals.       Expected Outcomes Short Term: Attend rehab on a regular basis to increase amount of physical activity.;Long Term: Add in home exercise to make exercise part of routine and to increase amount of physical activity.;Long Term: Exercising regularly at least 3-5 days a week.       Increase Strength and Stamina Yes       Intervention Provide advice, education, support and counseling about physical activity/exercise needs.;Develop an individualized exercise prescription for aerobic and resistive training based on initial evaluation findings, risk stratification, comorbidities and participant's personal goals.       Expected Outcomes Short Term: Increase workloads from initial exercise prescription for resistance, speed, and  METs.;Short Term: Perform resistance training exercises routinely during rehab and add in resistance training at home;Long Term: Improve cardiorespiratory fitness, muscular endurance and strength as measured by increased METs and functional capacity (6MWT)       Able to understand and use rate of perceived exertion (RPE) scale Yes       Intervention Provide education and explanation on how to use RPE scale       Expected Outcomes Short Term: Able to use RPE daily in rehab to express subjective intensity level;Long Term:  Able to use RPE to guide intensity level when exercising independently       Knowledge and  understanding of Target Heart Rate Range (THRR) Yes       Expected Outcomes Short Term: Able to state/look up THRR;Long Term: Able to use THRR to govern intensity when exercising independently;Short Term: Able to use daily as guideline for intensity in rehab       Understanding of Exercise Prescription Yes       Intervention Provide education, explanation, and written materials on patient's individual exercise prescription       Expected Outcomes Short Term: Able to explain program exercise prescription;Long Term: Able to explain home exercise prescription to exercise independently                Exercise Goals Re-Evaluation :    Discharge Exercise Prescription (Final Exercise Prescription Changes):   Nutrition:  Target Goals: Understanding of nutrition guidelines, daily intake of sodium 1500mg , cholesterol 200mg , calories 30% from fat and 7% or less from saturated fats, daily to have 5 or more servings of fruits and vegetables.  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 40 Therapeutic Level Cholesterol Diet   Picture Your Plate Scores: <20 Unhealthy dietary pattern with much room for improvement. 41-50 Dietary pattern unlikely to meet recommendations for good health and room for improvement. 51-60 More healthful dietary pattern, with some room for improvement.  >60 Healthy dietary pattern, although there may be some specific behaviors that could be improved.    Nutrition Goals  Re-Evaluation:   Nutrition Goals Discharge (Final Nutrition Goals Re-Evaluation):   Psychosocial: Target Goals: Acknowledge presence or absence of significant depression and/or stress, maximize coping skills, provide positive support system. Participant is able to verbalize types and ability to use techniques and skills needed for reducing stress and depression.  Initial Review & Psychosocial Screening:  Initial Psych Review & Screening - 11/29/21 1511       Initial Review   Current issues with History of Depression      Family Dynamics   Good Support System? Yes   [name] lives alone. [name] has friends she can depend on in the area as [name] is a widow     Barriers   Psychosocial barriers to participate in program The patient  should benefit from training in stress management and relaxation.      Screening Interventions   Interventions Encouraged to exercise;Provide feedback about the scores to participant    Expected Outcomes Short Term goal: Identification and review with participant of any Quality of Life or Depression concerns found by scoring the questionnaire.;Long Term Goal: Stressors or current issues are controlled or eliminated.;Long Term goal: The participant improves quality of Life and PHQ9 Scores as seen by post scores and/or verbalization of changes             Quality of Life Scores:  Quality of Life - 11/29/21 1210       Quality of Life   Select Quality of Life      Quality of Life Scores   Health/Function Pre 22.17 %    Socioeconomic Pre 28.63 %    Psych/Spiritual Pre 24.79 %    Family Pre --   Pt placed NA on family questions so no score was calculated   GLOBAL Pre 27.76 %            Scores of 19 and below usually indicate a poorer quality of life in these areas.  A difference of  2-3 points is a clinically meaningful difference.  A difference of 2-3 points in the total score of the Quality of Life Index has been associated with significant improvement in  overall quality of life, self-image, physical symptoms, and general health in studies assessing change in quality of life.  PHQ-9: Recent Review Flowsheet Data     Depression screen [facility] 2/9 11/29/2021   Decreased Interest 0   Down, Depressed, Hopeless 0   PHQ - 2 Score 0      Interpretation of Total Score  Total Score Depression Severity:  1-4 = Minimal depression, 5-9 = Mild depression, 10-14 = Moderate depression, 15-19 = Moderately severe depression, 20-27 = Severe depression   Psychosocial Evaluation and Intervention:   Psychosocial Re-Evaluation:   Psychosocial Discharge (Final Psychosocial Re-Evaluation):   Vocational Rehabilitation: Provide vocational rehab assistance to qualifying candidates.   Vocational Rehab Evaluation & Intervention:  Vocational Rehab - 11/29/21 1513       Initial Vocational Rehab Evaluation & Intervention   Assessment shows need for Vocational Rehabilitation No   [name] is semi retired and does not need vocational rehab at this time            Education: Education Goals: Education classes will be provided on a weekly basis, covering required topics. Participant will state understanding/return demonstration of topics presented.  Learning Barriers/Preferences:  Learning Barriers/Preferences - 11/29/21 1212       Learning Barriers/Preferences   Learning Barriers None    Learning Preferences Audio;Written Material;Computer/Internet;Group Instruction;Individual Instruction;Pictoral;Skilled Demonstration;Verbal Instruction;Video             Education Topics: Hypertension, Hypertension Reduction -Define heart disease and high blood pressure. Discus how high blood pressure affects the body and ways to reduce high blood pressure.   Exercise and Your Heart -Discuss why it is important to exercise, the FITT principles of exercise, normal and abnormal responses to exercise, and how to exercise safely.   Angina -Discuss definition of  angina, causes of angina, treatment of angina, and how to decrease risk of having angina.   Cardiac Medications -Review what the following cardiac medications are used for, how they affect the body, and side effects that may occur when taking the medications.  Medications include Aspirin, Beta blockers, calcium channel blockers, ACE Inhibitors, angiotensin receptor  blockers, diuretics, digoxin, and antihyperlipidemics.   Congestive Heart Failure -Discuss the definition of CHF, how to live with CHF, the signs and symptoms of CHF, and how keep track of weight and sodium intake.   Heart Disease and Intimacy -Discus the effect sexual activity has on the heart, how changes occur during intimacy as we age, and safety during sexual activity.   Smoking Cessation / COPD -Discuss different methods to quit smoking, the health benefits of quitting smoking, and the definition of COPD.   Nutrition I: Fats -Discuss the types of cholesterol, what cholesterol does to the heart, and how cholesterol levels can be controlled.   Nutrition II: Labels -Discuss the different components of food labels and how to read food label   Heart Parts/Heart Disease and PAD -Discuss the anatomy of the heart, the pathway of blood circulation through the heart, and these are affected by heart disease.   Stress I: Signs and Symptoms -Discuss the causes of stress, how stress may lead to anxiety and depression, and ways to limit stress.   Stress II: Relaxation -Discuss different types of relaxation techniques to limit stress.   Warning Signs of Stroke / TIA -Discuss definition of a stroke, what the signs and symptoms are of a stroke, and how to identify when someone is having stroke.   Knowledge Questionnaire Score:  Knowledge Questionnaire Score - 11/29/21 1213       Knowledge Questionnaire Score   Pre Score 23/28             Core Components/Risk Factors/Patient Goals at Admission:  Personal Goals  and Risk Factors at Admission - 11/29/21 1213       Core Components/Risk Factors/Patient Goals on Admission    Weight Management Yes;Weight Maintenance    Intervention Weight Management: Develop a combined nutrition and exercise program designed to reach desired caloric intake, while maintaining appropriate intake of nutrient and fiber, sodium and fats, and appropriate energy expenditure required for the weight goal.;Weight Management: Provide education and appropriate resources to help participant work on and attain dietary goals.    Expected Outcomes Weight Maintenance: Understanding of the daily nutrition guidelines, which includes 25-35% calories from fat, 7% or less cal from saturated fats, less than 200mg  cholesterol, less than 1.5gm of sodium, & 5 or more servings of fruits and vegetables daily    Tobacco Cessation Yes    Number of packs per day one every couple of weeks    Intervention Assist the participant in steps to quit. Provide individualized education and counseling about committing to Tobacco Cessation, relapse prevention, and pharmacological support that can be provided by physician.;Advice worker, assist with locating and accessing local/national Quit Smoking programs, and support quit date choice.    Expected Outcomes Short Term: Will demonstrate readiness to quit, by selecting a quit date.;Short Term: Will quit all tobacco product use, adhering to prevention of relapse plan.;Long Term: Complete abstinence from all tobacco products for at least 12 months from quit date.    Hypertension Yes    Intervention Provide education on lifestyle modifcations including regular physical activity/exercise, weight management, moderate sodium restriction and increased consumption of fresh fruit, vegetables, and low fat dairy, alcohol moderation, and smoking cessation.;Monitor prescription use compliance.    Expected Outcomes Short Term: Continued assessment and intervention until BP  is < 140/78mm HG in hypertensive participants. < 130/60mm HG in hypertensive participants with diabetes, heart failure or chronic kidney disease.;Long Term: Maintenance of blood pressure at goal levels.    Lipids  Yes    Intervention Provide education and support for participant on nutrition & aerobic/resistive exercise along with prescribed medications to achieve LDL 70mg , HDL >40mg .    Expected Outcomes Long Term: Cholesterol controlled with medications as prescribed, with individualized exercise RX and with personalized nutrition plan. Value goals: LDL < 70mg , HDL > 40 mg.;Short Term: Participant states understanding of desired cholesterol values and is compliant with medications prescribed. Participant is following exercise prescription and nutrition guidelines.    Personal Goal Other Yes    Personal Goal Walk with less SOB    Intervention Exercise 3x/week in the CRP2 program    Expected Outcomes Pt will have improved exercise tolerance and less SOB with walking.             Core Components/Risk Factors/Patient Goals Review:    Core Components/Risk Factors/Patient Goals at Discharge (Final Review):    ITP Comments:  ITP Comments     Row Name 11/29/21 1317           ITP Comments Dr Fransico Him MD, Medical Director                Comments: [name] attended orientation on 11/29/2021 to review rules and guidelines for program.  Completed 6 minute walk test, Intitial ITP, and exercise prescription.  VSS. Telemetry-Sinus Rhythm [name] did report having chronic left hip pain otherwise .  [name]'s left hip pain resolved with rest.Asymptomatic. Safety measures and social distancing in place per CDC guidelines.Harrell Gave RN BSN

## 2021-11-29 NOTE — Progress Notes (Signed)
Cardiac Rehab Medication Review by a Nurse  Does the patient  feel that his/her medications are working for him/her?  YES   Has the patient been experiencing any side effects to the medications prescribed?   NO  Does the patient measure his/her own blood pressure or blood glucose at home?  YES   Does the patient have any problems obtaining medications due to transportation or finances?    NO  Understanding of regimen: good Understanding of indications: good Potential of compliance: good    Nurse comments: Brenda Carney is taking her medications as prescribed and has a good understanding of what her medications are for. Brenda Carney has a BP cuff/monitor. Brenda Carney does not take her BP on a daily basis.    Brenda See Jolaine Fryberger RN 11/29/2021 1:19 PM

## 2021-12-01 DIAGNOSIS — Z20822 Contact with and (suspected) exposure to covid-19: Secondary | ICD-10-CM | POA: Diagnosis not present

## 2021-12-05 ENCOUNTER — Encounter (HOSPITAL_COMMUNITY): Payer: Medicare Other

## 2021-12-07 ENCOUNTER — Encounter (HOSPITAL_COMMUNITY): Payer: Medicare Other

## 2021-12-09 ENCOUNTER — Encounter (HOSPITAL_COMMUNITY): Payer: Medicare Other

## 2021-12-09 ENCOUNTER — Telehealth (HOSPITAL_COMMUNITY): Payer: Self-pay | Admitting: *Deleted

## 2021-12-09 NOTE — Telephone Encounter (Signed)
Left message to call cardiac rehab.Barnet Pall, RN,BSN 12/09/2021 1:03 PM

## 2021-12-12 ENCOUNTER — Encounter (HOSPITAL_COMMUNITY): Payer: Medicare Other

## 2021-12-14 ENCOUNTER — Encounter (HOSPITAL_COMMUNITY): Payer: Medicare Other

## 2021-12-14 ENCOUNTER — Encounter (HOSPITAL_COMMUNITY): Payer: Self-pay | Admitting: *Deleted

## 2021-12-14 ENCOUNTER — Telehealth (HOSPITAL_COMMUNITY): Payer: Self-pay | Admitting: *Deleted

## 2021-12-14 NOTE — Progress Notes (Signed)
Cardiac Individual Treatment Plan  Patient Details  Name: Brenda Carney MRN: [id number] Date of Birth: Apr 11, 1951 Referring Provider:   Flowsheet Row CARDIAC REHAB PHASE II ORIENTATION from 11/29/2021 in [place]  Referring Provider Glenetta Hew, MD       Initial Encounter Date:  [place] PHASE II ORIENTATION from 11/29/2021 in [place]  Date 11/29/21       Visit Diagnosis: No diagnosis found.  Patient's Home Medications on Admission:  Current Outpatient Medications:    albuterol (VENTOLIN HFA) 108 (90 Base) MCG/ACT inhaler, Inhale 2 puffs into the lungs every 4 (four) hours as needed for wheezing or shortness of breath., Disp: , Rfl:    amLODipine (NORVASC) 10 MG tablet, Take 1 tablet (10 mg total) by mouth daily., Disp: 30 tablet, Rfl: 0   ANORO ELLIPTA 62.5-25 MCG/INH AEPB, Inhale 2 puffs into the lungs daily as needed (wheezing). , Disp: , Rfl:    Apoaequorin (PREVAGEN) 10 MG CAPS, Take 10 mg by mouth daily., Disp: , Rfl:    ARIPiprazole (ABILIFY) 2 MG tablet, Take 2 mg by mouth daily., Disp: , Rfl:    aspirin EC 81 MG tablet, Take 81 mg by mouth daily. Swallow whole., Disp: , Rfl:    buPROPion (WELLBUTRIN XL) 150 MG 24 hr tablet, Take 150 mg by mouth daily., Disp: , Rfl: 1   Calcium Carbonate-Vitamin D (CALCIUM-VITAMIN D3 PO), Take 1 tablet by mouth daily. 1200 mg / 5000 units, Disp: , Rfl:    Carboxymethylcellulose Sodium (THERATEARS) 0.25 % SOLN, Place 1 drop into both eyes 4 (four) times daily as needed (dry eye)., Disp: , Rfl:    clopidogrel (PLAVIX) 75 MG tablet, Take 1 tablet by mouth daily., Disp: 90 tablet, Rfl: 0   desvenlafaxine (PRISTIQ) 100 MG 24 hr tablet, Take 100 mg by mouth daily., Disp: , Rfl:    Estradiol 10 MCG TABS vaginal tablet, Place 10 mcg vaginally once a week. , Disp: , Rfl:    ferrous sulfate 325 (65 FE) MG tablet, Take 325 mg by mouth daily with breakfast., Disp:  , Rfl:    Krill Oil 1000 MG CAPS, Take 1,000 mg by mouth daily., Disp: , Rfl:    lisinopril (ZESTRIL) 10 MG tablet, Take 10 mg by mouth daily., Disp: , Rfl:    Multiple Vitamin (MULTIVITAMIN WITH MINERALS) TABS tablet, Take 1 tablet by mouth daily. One-A-Day [place], Disp: , Rfl:    naphazoline-pheniramine (VISINE) 0.025-0.3 % ophthalmic solution, Place 1 drop into both eyes 4 (four) times daily as needed (Dry eye)., Disp: , Rfl:    Oxycodone HCl 10 MG TABS, Take 10 mg by mouth in the morning, at noon, in the evening, and at bedtime., Disp: , Rfl:    Propylene Glycol (SYSTANE COMPLETE) 0.6 % SOLN, Place 1 drop into both eyes 4 (four) times daily as needed (Dry eye)., Disp: , Rfl:    Propylhexedrine (BENZEDREX) INHA, Place 1 each into the nose daily., Disp: , Rfl:    REPATHA SURECLICK 194 MG/ML SOAJ, INJECT CONTENTS OF 1 PEN SUBCUTANEOUSLY EVERY 14 DAYS., Disp: 2 mL, Rfl: 11   traZODone (DESYREL) 50 MG tablet, Take 50 mg by mouth at bedtime., Disp: , Rfl:    zolpidem (AMBIEN) 10 MG tablet, Take 10 mg by mouth at bedtime. 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   Cervical spondylosis    Chronic lower back pain    COPD (chronic obstructive pulmonary disease) (HCC)    "little bit" (05/14/2017)   Coronary artery disease, non-occlusive    Coronary calcium scoring: June 2018: Score 1106. 98 percentile for age -> LOW RISK MYOVIEW   Depression    Exercise-induced asthma with acute exacerbation    "only in very hot weather" (05/14/2017)   GERD (gastroesophageal reflux disease)    History of IBS    resovlved   Hx of multiple concussions    from horse  training and riding   Hyperlipidemia with target LDL less than 70    Mostly statin intolerant,. 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   Pneumonia 2020   Sleep apnea    does not wear CPAP; "think it was medication related" (05/14/2017)   Stroke [facility]) 2010   TIA   Thoracic aortic ectasia (HCC)    ~40 mm on Coronary Calcium Score CT - recommend f/u CTA or MRA   TIA (transient ischemic attack) 09/2011   "several"    Tobacco Use: Social History   Tobacco Use  Smoking Status Some Days   Packs/day: 0.00   Years: 36.00   Pack years: 0.00   Types: Cigarettes  Smokeless Tobacco Never  Tobacco Comments   [name] says she smokes one cigarette every couple of weeks. [name] says she has a lot of friends who still smoke    Labs: Recent Review Flowsheet Data     Labs for ITP Cardiac and Pulmonary Rehab Latest Ref Rng & Units 05/15/2017 04/28/2018 08/13/2019 06/16/2020 09/08/2021   Cholestrol 100 - 199 mg/dL 251(H) - 205(H) - 153   LDLCALC 0 - 99 mg/dL 170(H) - 84 - 54   LDLDIRECT mg/dL - - - - -   HDL >39 mg/dL 47 - 61 - 79   Trlycerides 0 - 149 mg/dL 168(H) - 301(H) - 116   Hemoglobin A1c 4.8 - 5.6 % - - - - 5.4   TCO2 22 - 32 mmol/L - 23 - 27 -       Capillary Blood Glucose: Lab Results  Component Value Date   GLUCAP 109 (H) 04/30/2018   GLUCAP 110 (H) 04/29/2018   GLUCAP 107 (H) 09/05/2013   GLUCAP 110 (H) 10/01/2011     Exercise Target Goals: Exercise Program Goal: Individual exercise prescription set using results from initial 6 min walk test and THRR while considering  patients activity barriers and safety.   Exercise Prescription Goal: Starting with aerobic activity 30 plus minutes a day, 3 days per week for initial exercise prescription. Provide home exercise prescription and guidelines that  participant acknowledges understanding prior to discharge.  Activity Barriers & Risk Stratification:  Activity Barriers & Cardiac Risk Stratification - 11/29/21 1218       Activity Barriers & Cardiac Risk Stratification   Activity Barriers Back Problems;Left Hip Replacement;Right Hip Replacement;Neck/Spine Problems;Joint Problems;Shortness of Breath;Balance Concerns    Cardiac Risk Stratification High             6 Minute Walk:  6  Minute Walk     Row Name 11/29/21 1043         6 Minute Walk   Phase Initial     Distance 1508 feet     Walk Time 6 minutes     # of Rest Breaks 0     MPH 2.86     METS 3.7     RPE 12     Perceived Dyspnea  0     VO2 Peak 12.96     Symptoms Yes (comment)     Comments 5/10 low back pain on enrty. No back pain during walk test, however 7/10 left hip pain     Resting HR 96 bpm     Resting BP 122/84     Resting Oxygen Saturation  99 %     Exercise Oxygen Saturation  during 6 min walk 98 %     Max Ex. HR 112 bpm     Max Ex. BP 138/86     2 Minute Post BP 128/84              Oxygen Initial Assessment:   Oxygen Re-Evaluation:   Oxygen Discharge (Final Oxygen Re-Evaluation):   Initial Exercise Prescription:  Initial Exercise Prescription - 11/29/21 1200       Date of Initial Exercise RX and Referring Provider   Date 11/29/21    Referring Provider Glenetta Hew, MD    Expected Discharge Date 01/27/22      NuStep   Level 2    SPM 75    Minutes 30    METs 2.5      Prescription Details   Frequency (times per week) 3    Duration Progress to 30 minutes of continuous aerobic without signs/symptoms of physical distress      Intensity   THRR 40-80% of Max Heartrate 60-120    Ratings of Perceived Exertion 11-13    Perceived Dyspnea 0-4      Progression   Progression Continue progressive overload as per policy without signs/symptoms or physical distress.      Resistance Training   Training Prescription Yes    Weight 2 lbs     Reps 10-15             Perform Capillary Blood Glucose checks as needed.  Exercise Prescription Changes:   Exercise Comments:   Exercise Goals and Review:   Exercise Goals     Row Name 11/29/21 1220             Exercise Goals   Increase Physical Activity Yes       Intervention Provide advice, education, support and counseling about physical activity/exercise needs.;Develop an individualized exercise prescription for aerobic and resistive training based on initial evaluation findings, risk stratification, comorbidities and participant's personal goals.       Expected Outcomes Short Term: Attend rehab on a regular basis to increase amount of physical activity.;Long Term: Add in home exercise to make exercise part of routine and to increase amount of physical activity.;Long Term: Exercising regularly at least 3-5 days a week.       Increase Strength and Stamina Yes       Intervention Provide advice, education, support and counseling about physical activity/exercise needs.;Develop an individualized exercise prescription for aerobic and resistive training based on initial evaluation findings, risk stratification, comorbidities and participant's personal goals.       Expected Outcomes Short Term: Increase workloads from initial exercise prescription for resistance, speed, and METs.;Short Term: Perform resistance  training exercises routinely during rehab and add in resistance training at home;Long Term: Improve cardiorespiratory fitness, muscular endurance and strength as measured by increased METs and functional capacity (6MWT)       Able to understand and use rate of perceived exertion (RPE) scale Yes       Intervention Provide education and explanation on how to use RPE scale       Expected Outcomes Short Term: Able to use RPE daily in rehab to express subjective intensity level;Long Term:  Able to use RPE to guide intensity level when exercising independently       Knowledge and  understanding of Target Heart Rate Range (THRR) Yes       Expected Outcomes Short Term: Able to state/look up THRR;Long Term: Able to use THRR to govern intensity when exercising independently;Short Term: Able to use daily as guideline for intensity in rehab       Understanding of Exercise Prescription Yes       Intervention Provide education, explanation, and written materials on patient's individual exercise prescription       Expected Outcomes Short Term: Able to explain program exercise prescription;Long Term: Able to explain home exercise prescription to exercise independently                Exercise Goals Re-Evaluation :    Discharge Exercise Prescription (Final Exercise Prescription Changes):   Nutrition:  Target Goals: Understanding of nutrition guidelines, daily intake of sodium 1500mg , cholesterol 200mg , calories 30% from fat and 7% or less from saturated fats, daily to have 5 or more servings of fruits and vegetables.  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 40 Therapeutic Level Cholesterol Diet   Picture Your Plate Scores: <01 Unhealthy dietary pattern with much room for improvement. 41-50 Dietary pattern unlikely to meet recommendations for good health and room for improvement. 51-60 More healthful dietary pattern, with some room for improvement.  >60 Healthy dietary pattern, although there may be some specific behaviors that could be improved.    Nutrition Goals  Re-Evaluation:   Nutrition Goals Discharge (Final Nutrition Goals Re-Evaluation):   Psychosocial: Target Goals: Acknowledge presence or absence of significant depression and/or stress, maximize coping skills, provide positive support system. Participant is able to verbalize types and ability to use techniques and skills needed for reducing stress and depression.  Initial Review & Psychosocial Screening:  Initial Psych Review & Screening - 11/29/21 1511       Initial Review   Current issues with History of Depression      Family Dynamics   Good Support System? Yes   [name] lives alone. [name] has friends she can depend on in the area as [name] is a widow     Barriers   Psychosocial barriers to participate in program The patient should benefit from training  in stress management and relaxation.      Screening Interventions   Interventions Encouraged to exercise;Provide feedback about the scores to participant    Expected Outcomes Short Term goal: Identification and review with participant of any Quality of Life or Depression concerns found by scoring the questionnaire.;Long Term Goal: Stressors or current issues are controlled or eliminated.;Long Term goal: The participant improves quality of Life and PHQ9 Scores as seen by post scores and/or verbalization of changes             Quality of Life Scores:  Quality of Life - 11/29/21 1210       Quality of Life   Select Quality of Life      Quality of Life Scores   Health/Function Pre 22.17 %    Socioeconomic Pre 28.63 %    Psych/Spiritual Pre 24.79 %    Family Pre --   Pt placed NA on family questions so no score was calculated   GLOBAL Pre 27.76 %            Scores of 19 and below usually indicate a poorer quality of life in these areas.  A difference of  2-3 points is a clinically meaningful difference.  A difference of 2-3 points in the total score of the Quality of Life Index has been associated with significant improvement in  overall quality of life, self-image, physical symptoms, and general health in studies assessing change in quality of life.  PHQ-9: Recent Review Flowsheet Data     Depression screen [facility] 2/9 11/29/2021   Decreased Interest 0   Down, Depressed, Hopeless 0   PHQ - 2 Score 0      Interpretation of Total Score  Total Score Depression Severity:  1-4 = Minimal depression, 5-9 = Mild depression, 10-14 = Moderate depression, 15-19 = Moderately severe depression, 20-27 = Severe depression   Psychosocial Evaluation and Intervention:   Psychosocial Re-Evaluation:   Psychosocial Discharge (Final Psychosocial Re-Evaluation):   Vocational Rehabilitation: Provide vocational rehab assistance to qualifying candidates.   Vocational Rehab Evaluation & Intervention:  Vocational Rehab - 11/29/21 1513       Initial Vocational Rehab Evaluation & Intervention   Assessment shows need for Vocational Rehabilitation No   [name] is semi retired and does not need vocational rehab at this time            Education: Education Goals: Education classes will be provided on a weekly basis, covering required topics. Participant will state understanding/return demonstration of topics presented.  Learning Barriers/Preferences:  Learning Barriers/Preferences - 11/29/21 1212       Learning Barriers/Preferences   Learning Barriers None    Learning Preferences Audio;Written Material;Computer/Internet;Group Instruction;Individual Instruction;Pictoral;Skilled Demonstration;Verbal Instruction;Video             Education Topics: Hypertension, Hypertension Reduction -Define heart disease and high blood pressure. Discus how high blood pressure affects the body and ways to reduce high blood pressure.   Exercise and Your Heart -Discuss why it is important to exercise, the FITT principles of exercise, normal and abnormal responses to exercise, and how to exercise safely.   Angina -Discuss definition of  angina, causes of angina, treatment of angina, and how to decrease risk of having angina.   Cardiac Medications -Review what the following cardiac medications are used for, how they affect the body, and side effects that may occur when taking the medications.  Medications include Aspirin, Beta blockers, calcium channel blockers, ACE Inhibitors, angiotensin receptor blockers, diuretics, digoxin, and  antihyperlipidemics.   Congestive Heart Failure -Discuss the definition of CHF, how to live with CHF, the signs and symptoms of CHF, and how keep track of weight and sodium intake.   Heart Disease and Intimacy -Discus the effect sexual activity has on the heart, how changes occur during intimacy as we age, and safety during sexual activity.   Smoking Cessation / COPD -Discuss different methods to quit smoking, the health benefits of quitting smoking, and the definition of COPD.   Nutrition I: Fats -Discuss the types of cholesterol, what cholesterol does to the heart, and how cholesterol levels can be controlled.   Nutrition II: Labels -Discuss the different components of food labels and how to read food label   Heart Parts/Heart Disease and PAD -Discuss the anatomy of the heart, the pathway of blood circulation through the heart, and these are affected by heart disease.   Stress I: Signs and Symptoms -Discuss the causes of stress, how stress may lead to anxiety and depression, and ways to limit stress.   Stress II: Relaxation -Discuss different types of relaxation techniques to limit stress.   Warning Signs of Stroke / TIA -Discuss definition of a stroke, what the signs and symptoms are of a stroke, and how to identify when someone is having stroke.   Knowledge Questionnaire Score:  Knowledge Questionnaire Score - 11/29/21 1213       Knowledge Questionnaire Score   Pre Score 23/28             Core Components/Risk Factors/Patient Goals at Admission:  Personal Goals  and Risk Factors at Admission - 11/29/21 1213       Core Components/Risk Factors/Patient Goals on Admission    Weight Management Yes;Weight Maintenance    Intervention Weight Management: Develop a combined nutrition and exercise program designed to reach desired caloric intake, while maintaining appropriate intake of nutrient and fiber, sodium and fats, and appropriate energy expenditure required for the weight goal.;Weight Management: Provide education and appropriate resources to help participant work on and attain dietary goals.    Expected Outcomes Weight Maintenance: Understanding of the daily nutrition guidelines, which includes 25-35% calories from fat, 7% or less cal from saturated fats, less than 200mg  cholesterol, less than 1.5gm of sodium, & 5 or more servings of fruits and vegetables daily    Tobacco Cessation Yes    Number of packs per day one every couple of weeks    Intervention Assist the participant in steps to quit. Provide individualized education and counseling about committing to Tobacco Cessation, relapse prevention, and pharmacological support that can be provided by physician.;Advice worker, assist with locating and accessing local/national Quit Smoking programs, and support quit date choice.    Expected Outcomes Short Term: Will demonstrate readiness to quit, by selecting a quit date.;Short Term: Will quit all tobacco product use, adhering to prevention of relapse plan.;Long Term: Complete abstinence from all tobacco products for at least 12 months from quit date.    Hypertension Yes    Intervention Provide education on lifestyle modifcations including regular physical activity/exercise, weight management, moderate sodium restriction and increased consumption of fresh fruit, vegetables, and low fat dairy, alcohol moderation, and smoking cessation.;Monitor prescription use compliance.    Expected Outcomes Short Term: Continued assessment and intervention until BP  is < 140/46mm HG in hypertensive participants. < 130/41mm HG in hypertensive participants with diabetes, heart failure or chronic kidney disease.;Long Term: Maintenance of blood pressure at goal levels.    Lipids Yes  Intervention Provide education and support for participant on nutrition & aerobic/resistive exercise along with prescribed medications to achieve LDL 70mg , HDL >40mg .    Expected Outcomes Long Term: Cholesterol controlled with medications as prescribed, with individualized exercise RX and with personalized nutrition plan. Value goals: LDL < 70mg , HDL > 40 mg.;Short Term: Participant states understanding of desired cholesterol values and is compliant with medications prescribed. Participant is following exercise prescription and nutrition guidelines.    Personal Goal Other Yes    Personal Goal Walk with less SOB    Intervention Exercise 3x/week in the CRP2 program    Expected Outcomes Pt will have improved exercise tolerance and less SOB with walking.             Core Components/Risk Factors/Patient Goals Review:    Core Components/Risk Factors/Patient Goals at Discharge (Final Review):    ITP Comments:  ITP Comments     Row Name 11/29/21 1317 12/14/21 1418         ITP Comments Dr Fransico Him MD, Medical Director 30 Day ITP Review. [name] is to begin exercise on 12/21/21               Comments: See ITP comments.Barnet Pall, RN,BSN 12/14/2021 2:19 PM

## 2021-12-14 NOTE — Telephone Encounter (Signed)
Brenda Carney says she forgot about her exercise appointments and will be out of town until 12/21/21. Brenda Carney will start exercise next week and asked for a printout of her appointments.Barnet Pall, RN,BSN 12/14/2021 2:15 PM

## 2021-12-16 ENCOUNTER — Encounter (HOSPITAL_COMMUNITY): Payer: Medicare Other

## 2021-12-17 DIAGNOSIS — R051 Acute cough: Secondary | ICD-10-CM | POA: Diagnosis not present

## 2021-12-17 DIAGNOSIS — U071 COVID-19: Secondary | ICD-10-CM | POA: Diagnosis not present

## 2021-12-20 DIAGNOSIS — Z79899 Other long term (current) drug therapy: Secondary | ICD-10-CM | POA: Diagnosis not present

## 2021-12-20 DIAGNOSIS — Z79891 Long term (current) use of opiate analgesic: Secondary | ICD-10-CM | POA: Diagnosis not present

## 2021-12-20 DIAGNOSIS — M961 Postlaminectomy syndrome, not elsewhere classified: Secondary | ICD-10-CM | POA: Diagnosis not present

## 2021-12-20 DIAGNOSIS — M87059 Idiopathic aseptic necrosis of unspecified femur: Secondary | ICD-10-CM | POA: Diagnosis not present

## 2021-12-20 DIAGNOSIS — G894 Chronic pain syndrome: Secondary | ICD-10-CM | POA: Diagnosis not present

## 2021-12-20 DIAGNOSIS — M5137 Other intervertebral disc degeneration, lumbosacral region: Secondary | ICD-10-CM | POA: Diagnosis not present

## 2021-12-21 ENCOUNTER — Encounter (HOSPITAL_COMMUNITY)
Admission: RE | Admit: 2021-12-21 | Discharge: 2021-12-21 | Disposition: A | Discharge status: Home / Self Care | Payer: Medicare Other | Source: Ambulatory Visit | Attending: Cardiology | Admitting: Cardiology

## 2021-12-21 ENCOUNTER — Other Ambulatory Visit: Payer: Self-pay

## 2021-12-21 DIAGNOSIS — Z955 Presence of coronary angioplasty implant and graft: Secondary | ICD-10-CM | POA: Diagnosis not present

## 2021-12-21 NOTE — Progress Notes (Signed)
Daily Session Note  Patient Details  Name: Brenda Carney MRN: [id number] Date of Birth: 1951/06/26 Referring Provider:   Flowsheet Row CARDIAC REHAB PHASE II ORIENTATION from 11/29/2021 in [place]  Referring Provider Brenda Hew, MD       Encounter Date: 12/21/2021  Check In:  Session Check In - 12/21/21 1317       Check-In   Supervising physician immediately available to respond to emergencies [facility] immediately available    Physician(s) Dr. Broadus John    Location [facility]    Staff Present Brenda Pall, RN, Brenda Glazier, MS, ACSM-CEP, CCRP, Exercise Physiologist;Brenda [place] Reeves, RN, BSN;Brenda Carney BS, ACSM EP-C, Exercise Physiologist;Brenda [place], MS, ACSM CEP, Exercise Physiologist    Virtual Visit No    Medication changes reported     No    Fall or balance concerns reported    No    Tobacco Cessation No Change    Current number of cigarettes/nicotine per day     0   One cigarette every couple of weeks   Warm-up and Cool-down Performed on first and last piece of equipment    Resistance Training Performed No    VAD Patient? No    PAD/SET Patient? No      Pain Assessment   Currently in Pain? No/denies    Pain Score 0-No pain    Multiple Pain Sites No             Capillary Blood Glucose: No results found for this or any previous visit (from the past 24 hour(s)).   Exercise Prescription Changes - 12/21/21 1500       Response to Exercise   Blood Pressure (Admit) 134/84    Blood Pressure (Exercise) 140/80    Blood Pressure (Exit) 126/70    Heart Rate (Admit) 100 bpm    Heart Rate (Exercise) 117 bpm    Heart Rate (Exit) 100 bpm    Rating of Perceived Exertion (Exercise) 9    Symptoms None    Comments Pt's first day in the CRP2 program    Duration Continue with 30 min of aerobic exercise without signs/symptoms of physical distress.    Intensity THRR unchanged      Progression    Progression Continue to progress workloads to maintain intensity without signs/symptoms of physical distress.    Average METs 1.8      Resistance Training   Training Prescription No    Weight No weights on Wednesdays      Interval Training   Interval Training No      NuStep   Level 2    SPM 70    Minutes 25    METs 1.8             Social History   Tobacco Use  Smoking Status Some Days   Packs/day: 0.00   Years: 36.00   Pack years: 0.00   Types: Cigarettes  Smokeless Tobacco Never  Tobacco Comments   Brenda Carney says she smokes one cigarette every couple of weeks. Brenda Carney says she has a lot of friends who still smoke    Goals Met:  Exercise tolerated well No report of concerns or symptoms today  Goals Unmet:  Not Applicable  Comments: Brenda Carney started cardiac rehab today.  Pt tolerated light exercise without difficulty. VSS, telemetry-Sinus Rhythm, asymptomatic.  Medication list reconciled. Pt denies barriers to medicaiton compliance.  PSYCHOSOCIAL ASSESSMENT:  PHQ-0. Pt exhibits positive coping skills, hopeful outlook with supportive  family. No psychosocial needs identified at this time, no psychosocial interventions necessary.    Pt enjoys showing dogs and horses.   Pt oriented to exercise equipment and routine.    Understanding verbalized. Brenda Carney was given a printed copy of her appointments at cardiac rehab.Brenda Pall, RN,BSN 12/22/2021 10:01 AM    Dr. Fransico Carney is Medical Director for Cardiac Rehab at [facility].

## 2021-12-23 ENCOUNTER — Encounter (HOSPITAL_COMMUNITY)
Admission: RE | Admit: 2021-12-23 | Discharge: 2021-12-23 | Disposition: A | Discharge status: Home / Self Care | Payer: Medicare Other | Source: Ambulatory Visit | Attending: Cardiology | Admitting: Cardiology

## 2021-12-23 ENCOUNTER — Other Ambulatory Visit: Payer: Self-pay

## 2021-12-23 DIAGNOSIS — Z955 Presence of coronary angioplasty implant and graft: Secondary | ICD-10-CM | POA: Diagnosis not present

## 2021-12-27 ENCOUNTER — Ambulatory Visit (INDEPENDENT_AMBULATORY_CARE_PROVIDER_SITE_OTHER)
Admission: RE | Admit: 2021-12-27 | Discharge: 2021-12-27 | Disposition: A | Discharge status: Home / Self Care | Payer: Medicare Other | Source: Ambulatory Visit | Attending: Vascular Surgery | Admitting: Vascular Surgery

## 2021-12-27 ENCOUNTER — Encounter: Payer: Self-pay | Admitting: Vascular Surgery

## 2021-12-27 ENCOUNTER — Ambulatory Visit (INDEPENDENT_AMBULATORY_CARE_PROVIDER_SITE_OTHER): Payer: Medicare Other | Admitting: Vascular Surgery

## 2021-12-27 ENCOUNTER — Ambulatory Visit (HOSPITAL_COMMUNITY)
Admission: RE | Admit: 2021-12-27 | Discharge: 2021-12-27 | Disposition: A | Discharge status: Home / Self Care | Payer: Medicare Other | Source: Ambulatory Visit | Attending: Vascular Surgery | Admitting: Vascular Surgery

## 2021-12-27 ENCOUNTER — Other Ambulatory Visit: Payer: Self-pay

## 2021-12-27 VITALS — BP 139/90 | HR 85 | Temp 97.6°F | Resp 14 | Ht 65.0 in | Wt 120.0 lb

## 2021-12-27 DIAGNOSIS — I739 Peripheral vascular disease, unspecified: Secondary | ICD-10-CM | POA: Diagnosis not present

## 2021-12-27 NOTE — Progress Notes (Signed)
Patient name: Brenda Carney MRN: [id number] DOB: 10-30-1951 Sex: female  REASON FOR VISIT: 63-month follow-up for surveillance of right leg bypass and previous SFA intervention  HPI: Brenda Carney is a 71 y.o. female with multiple medical problems as noted below who presents for 74-month interval follow-up of her right leg bypass and most recent right SFA intervention.  She most recently underwent right SFA laser atherectomy with balloon angioplasty including drug-coated balloon on 06/16/2020.  This is for recurrent claudication in the setting of previous right above-knee to below-knee pop bypass with vein by Dr. Bridgett Larsson in 2018.  On arteriogram she was found to have an occluded SFA proximal to the bypass and the bypass remained patent.    We were concerned about some recurrent stenosis identified on previous duplex and have elected for short interval surveillance.  On follow-up today she reports no new issues.  No cramping in her right leg when she walks.  She is still smoking about 1 to 2 cigarettes a month.  Past Medical History:  Diagnosis Date   Arthritis    "thumbs; hips" (05/14/2017)   Cerebral aneurysm    Right ophthalmic artery, left callosal marginal anterior cerebral artery branch   Cerebrovascular disease    Carotid dopplers which showed no significant increase in velocities, extremely minimal on the left, antegrade vertebral bilaterally.   Cervical spondylosis    Chronic lower back pain    COPD (chronic obstructive pulmonary disease) (HCC)    "little bit" (05/14/2017)   Coronary artery disease, non-occlusive    Coronary calcium scoring: June 2018: Score 1106. 98 percentile for age -> LOW RISK MYOVIEW   Depression    Exercise-induced asthma with acute exacerbation    "only in very hot weather" (05/14/2017)   GERD (gastroesophageal reflux disease)    History of IBS    resovlved   Hx of multiple concussions    from horse training and riding   Hyperlipidemia with target LDL less  than 70    Mostly statin intolerant,. 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   Pneumonia 2020   Sleep apnea    does not wear CPAP; "think it was medication related" (05/14/2017)   Stroke [facility]) 2010   TIA   Thoracic aortic ectasia (HCC)    ~40 mm on Coronary Calcium Score CT - recommend f/u CTA or MRA   TIA (transient ischemic attack) 09/2011   "several"    Past Surgical History:  Procedure Laterality Date   ABDOMINAL AORTOGRAM W/LOWER EXTREMITY N/A 05/14/2017   Procedure: Abdominal Aortogram w/Lower Extremity;  Surgeon: Conrad [place], MD;  Location: [place] [facility];  Service: Cardiovascular;  Laterality: N/A;   ACROMIO-CLAVICULAR JOINT REPAIR Left 1990s   "I think it was called an [facility] joint removal"   BACK SURGERY     BYPASS GRAFT POPLITEAL TO POPLITEAL Right 05/15/2017   Procedure: BYPASS GRAFT ABOVE KNEE POPLITEAL TO BELOW KNEE POPLITEAL ARTERY;  Surgeon: Conrad Norco, MD;  Location: [place];  Service: Vascular;  Laterality: Right;   CARDIAC CATHETERIZATION     CORONARY STENT INTERVENTION N/A 09/30/2021   Procedure: CORONARY STENT INTERVENTION;  Surgeon: Leonie Man, MD;  Location: [place] [facility];  Service: Cardiovascular;  Laterality: N/A;   ESOPHAGOGASTRODUODENOSCOPY (EGD) WITH PROPOFOL N/A 04/29/2018   Procedure:  ESOPHAGOGASTRODUODENOSCOPY (EGD) WITH PROPOFOL;  Surgeon: Otis Brace, MD;  Location: [place];  Service: Gastroenterology;  Laterality: N/A;   FOOT FRACTURE SURGERY Bilateral    multiple procedures   FRACTURE SURGERY     INTRAVASCULAR ULTRASOUND/IVUS N/A 09/30/2021   Procedure: Intravascular Ultrasound/IVUS;  Surgeon: Leonie Man, MD;  Location: [place] [facility];  Service: Cardiovascular;  Laterality: N/A;   JOINT REPLACEMENT     LEFT HEART CATH AND CORONARY  ANGIOGRAPHY N/A 09/30/2021   Procedure: LEFT HEART CATH AND CORONARY ANGIOGRAPHY;  Surgeon: Leonie Man, MD;  Location: [place] [facility];  Service: Cardiovascular;  Laterality: N/A;   LOWER EXTREMITY ANGIOGRAPHY N/A 06/16/2020   Procedure: LOWER EXTREMITY ANGIOGRAPHY;  Surgeon: [name], MD;  Location: [place] [facility];  Service: Cardiovascular;  Laterality: N/A;   MAXIMUM ACCESS (MAS)POSTERIOR LUMBAR INTERBODY FUSION (PLIF) 1 LEVEL N/A 09/30/2014   Procedure: FOR MAXIMUM ACCESS (MAS) POSTERIOR LUMBAR INTERBODY FUSION (PLIF) 1 LEVEL;  Surgeon: Eustace Moore, MD;  Location: [place] [facility];  Service: Neurosurgery;  Laterality: N/A;  FOR MAXIMUM ACCESS (MAS) POSTERIOR LUMBAR INTERBODY FUSION (PLIF) 1 LEVEL LUMBAR 4-5   MULTIPLE TOOTH EXTRACTIONS Right 04/2017   NM MYOVIEW LTD  06/2017    Normal EF, 66%. No ST changes. Normal study. LOW RISK.   PERIPHERAL VASCULAR ATHERECTOMY Left 06/16/2020   Procedure: PERIPHERAL VASCULAR ATHERECTOMY;  Surgeon: [name], MD;  Location: [place] [facility];  Service: Cardiovascular;  Laterality: Left;  SFA   TOTAL HIP ARTHROPLASTY Right 01/16/2020   Procedure: RIGHT TOTAL HIP ARTHROPLASTY ANTERIOR APPROACH;  Surgeon: Mcarthur Rossetti, MD;  Location: [facility];  Service: Orthopedics;  Laterality: Right;   TOTAL HIP ARTHROPLASTY Left 04/30/2020   Procedure: LEFT TOTAL HIP ARTHROPLASTY ANTERIOR APPROACH;  Surgeon: Mcarthur Rossetti, MD;  Location: [facility];  Service: Orthopedics;  Laterality: Left;   TRANSTHORACIC ECHOCARDIOGRAM  09/07/2013   Done to evaluate TIA: Normal LV size and function. EF 55-60%. GR 1 DD. Mild left atrial dilation. Mildly calcified mitral leaflets. Otherwise normal.    Family History  Problem Relation Age of Onset   Stroke Father    Heart attack Mother    Stroke Mother     SOCIAL HISTORY: Social History   Tobacco Use   Smoking status: Some Days    Packs/day: 0.00    Years: 36.00    Pack years:  0.00    Types: Cigarettes   Smokeless tobacco: Never   Tobacco comments:    [name] says she smokes one cigarette every couple of weeks. [name] says she has a lot of friends who still smoke  Substance Use Topics   Alcohol use: Yes    Alcohol/week: 3.0 standard drinks    Types: 3 Glasses of wine per week    Allergies  Allergen Reactions   Penicillins Swelling and Other (See Comments)    TONGUE SWELLING  PATIENT HAD A PCN REACTION WITH IMMEDIATE RASH, FACIAL/TONGUE/THROAT SWELLING, SOB, OR LIGHTHEADEDNESS WITH HYPOTENSION:  #  #  #  YES  #  #  #   Has patient had a PCN reaction causing severe rash involving mucus membranes or skin necrosis: No Has patient had a PCN reaction that required hospitalization: No Has patient had a PCN reaction occurring within the last 10 years: No    Statins     muscle pain   Lyrica [Pregabalin] Other (See Comments)    DRY MOUTH    Current Outpatient Medications  Medication  Sig Dispense Refill   albuterol (VENTOLIN HFA) 108 (90 Base) MCG/ACT inhaler Inhale 2 puffs into the lungs every 4 (four) hours as needed for wheezing or shortness of breath.     amLODipine (NORVASC) 10 MG tablet Take 1 tablet (10 mg total) by mouth daily. 30 tablet 0   ANORO ELLIPTA 62.5-25 MCG/INH AEPB Inhale 2 puffs into the lungs daily as needed (wheezing).      Apoaequorin (PREVAGEN) 10 MG CAPS Take 10 mg by mouth daily.     ARIPiprazole (ABILIFY) 2 MG tablet Take 2 mg by mouth daily.     aspirin EC 81 MG tablet Take 81 mg by mouth daily. Swallow whole.     buPROPion (WELLBUTRIN XL) 150 MG 24 hr tablet Take 150 mg by mouth daily.  1   Calcium Carbonate-Vitamin D (CALCIUM-VITAMIN D3 PO) Take 1 tablet by mouth daily. 1200 mg / 5000 units     Carboxymethylcellulose Sodium (THERATEARS) 0.25 % SOLN Place 1 drop into both eyes 4 (four) times daily as needed (dry eye).     clopidogrel (PLAVIX) 75 MG tablet Take 1 tablet by mouth daily. 90 tablet 0   desvenlafaxine (PRISTIQ) 100 MG 24 hr  tablet Take 100 mg by mouth daily.     Estradiol 10 MCG TABS vaginal tablet Place 10 mcg vaginally once a week.      ferrous sulfate 325 (65 FE) MG tablet Take 325 mg by mouth daily with breakfast.     Krill Oil 1000 MG CAPS Take 1,000 mg by mouth daily.     lisinopril (ZESTRIL) 10 MG tablet Take 10 mg by mouth daily.     Multiple Vitamin (MULTIVITAMIN WITH MINERALS) TABS tablet Take 1 tablet by mouth daily. One-A-Day [place]     naphazoline-pheniramine (VISINE) 0.025-0.3 % ophthalmic solution Place 1 drop into both eyes 4 (four) times daily as needed (Dry eye).     Oxycodone HCl 10 MG TABS Take 10 mg by mouth in the morning, at noon, in the evening, and at bedtime.     Propylene Glycol (SYSTANE COMPLETE) 0.6 % SOLN Place 1 drop into both eyes 4 (four) times daily as needed (Dry eye).     Propylhexedrine (BENZEDREX) INHA Place 1 each into the nose daily.     REPATHA SURECLICK 259 MG/ML SOAJ INJECT CONTENTS OF 1 PEN SUBCUTANEOUSLY EVERY 14 DAYS. 2 mL 11   traZODone (DESYREL) 50 MG tablet Take 50 mg by mouth at bedtime.     zolpidem (AMBIEN) 10 MG tablet Take 10 mg by mouth at bedtime.      No current facility-administered medications for this visit.    REVIEW OF SYSTEMS:  [X]  denotes positive finding, [ ]  denotes negative finding Cardiac  Comments:  Chest pain or chest pressure:    Shortness of breath upon exertion:    Short of breath when lying flat:    Irregular heart rhythm:        Vascular    Pain in calf, thigh, or hip brought on by ambulation:    Pain in feet at night that wakes you up from your sleep:     Blood clot in your veins:    Leg swelling:         Pulmonary    Oxygen at home:    Productive cough:     Wheezing:         Neurologic    Sudden weakness in arms or legs:     Sudden numbness in  arms or legs:     Sudden onset of difficulty speaking or slurred speech:    Temporary loss of vision in one eye:     Problems with dizziness:         Gastrointestinal     Blood in stool:     Vomited blood:         Genitourinary    Burning when urinating:     Blood in urine:        Psychiatric    Major depression:         Hematologic    Bleeding problems:    Problems with blood clotting too easily:        Skin    Rashes or ulcers:        Constitutional    Fever or chills:      PHYSICAL EXAM: Vitals:   12/27/21 1204  BP: 139/90  Pulse: 85  Resp: 14  Temp: 97.6 F (36.4 C)  TempSrc: Temporal  SpO2: 93%  Weight: 120 lb (54.4 kg)  Height: 5\' 5"  (1.651 m)    GENERAL: The patient is a well-nourished female, in no acute distress. The vital signs are documented above. CARDIAC: There is a regular rate and rhythm.  VASCULAR:  Bilateral femoral pulses palpable Bilateral PT pulses palpable No lower extremity tissue loss Pulm: No respiratory distress.  DATA:   ABIs today are 1.06 on the right triphasic and 1.04 on the left triphasic  Right lower extremity arterial duplex shows a patent SFA and patent popliteal bypass and no stenosis identified.  Assessment/Plan:  70 year old female who underwent right SFA laser atherectomy with balloon angioplasty including drug-coated balloon on 06/16/2020 for SFA occlusion proximal to her above-knee to below-knee popliteal artery bypass with vein done by Dr. Bridgett Larsson in 2018.   There has been a question of recurrent SFA plaque proximal to her bypass in the area of previous intervention.  We have elected for short interval surveillance given no symptoms and preserved ABI's.  On follow-up today she is having no lower extremity symptoms.  She continues to have a palpable PT pulse at the ankle.  ABIs are still preserved and no stenosis identified on duplex today.  Discussed I will see her again in 6 months.  She will let me know if she has any problems.   [name], MD [facility] Office: [phone]

## 2021-12-27 NOTE — Progress Notes (Signed)
ABI and right lower extremity bypass graft duplex completed. Refer to "CV Proc" under chart review to view preliminary results.  12/27/2021 11:34 AM Kelby Aline., MHA, RVT, RDCS, RDMS

## 2021-12-28 ENCOUNTER — Other Ambulatory Visit: Payer: Self-pay

## 2021-12-28 ENCOUNTER — Encounter (HOSPITAL_COMMUNITY)
Admission: RE | Admit: 2021-12-28 | Discharge: 2021-12-28 | Disposition: A | Discharge status: Home / Self Care | Payer: Medicare Other | Source: Ambulatory Visit | Attending: Cardiology | Admitting: Cardiology

## 2021-12-28 DIAGNOSIS — I739 Peripheral vascular disease, unspecified: Secondary | ICD-10-CM

## 2021-12-28 DIAGNOSIS — Z955 Presence of coronary angioplasty implant and graft: Secondary | ICD-10-CM | POA: Insufficient documentation

## 2021-12-30 ENCOUNTER — Encounter (HOSPITAL_COMMUNITY)
Admission: RE | Admit: 2021-12-30 | Discharge: 2021-12-30 | Disposition: A | Discharge status: Home / Self Care | Payer: Medicare Other | Source: Ambulatory Visit | Attending: Cardiology | Admitting: Cardiology

## 2021-12-30 ENCOUNTER — Other Ambulatory Visit: Payer: Self-pay

## 2021-12-30 DIAGNOSIS — Z955 Presence of coronary angioplasty implant and graft: Secondary | ICD-10-CM | POA: Diagnosis not present

## 2022-01-02 ENCOUNTER — Encounter (HOSPITAL_COMMUNITY)
Admission: RE | Admit: 2022-01-02 | Discharge: 2022-01-02 | Disposition: A | Discharge status: Home / Self Care | Payer: Medicare Other | Source: Ambulatory Visit | Attending: Cardiology | Admitting: Cardiology

## 2022-01-02 ENCOUNTER — Other Ambulatory Visit: Payer: Self-pay

## 2022-01-02 DIAGNOSIS — Z955 Presence of coronary angioplasty implant and graft: Secondary | ICD-10-CM

## 2022-01-02 NOTE — Progress Notes (Addendum)
Returned call. Spoke to [employer] PA with Dr. Ellyn Hack.  Advised of pt events.  No further intervention, continue to monitor for any reoccurrence.  Advise pt to eat and drink prior to cardiac rehab. Avoid caffeine and abstain from cigarette smoking. Pt verbalized understanding. Cherre Huger, BSN Cardiac and Training and development officer

## 2022-01-02 NOTE — Progress Notes (Addendum)
Pt in today for cardiac rehab.  Pt exercising on nustep level 2.0. Pt with 23 beat run of NSVT with rate 175-200.  Pt asymptomatic bp 112/72.  Pt had coffee(caffeine) and smoked a cigarette prior to exercise.  This is unusual for her however had a busy morning prior to coming in this afternoon for Cardiac rehab. Pt admits she did take her medications but did not eat anything all day.  Called and spoke to [employer], [place].  Vin Bhagat paged, await response. Cherre Huger, BSN Cardiac and Training and development officer

## 2022-01-04 ENCOUNTER — Encounter (HOSPITAL_COMMUNITY): Payer: Medicare Other

## 2022-01-04 ENCOUNTER — Telehealth (HOSPITAL_COMMUNITY): Payer: Self-pay | Admitting: Internal Medicine

## 2022-01-04 DIAGNOSIS — H00015 Hordeolum externum left lower eyelid: Secondary | ICD-10-CM | POA: Diagnosis not present

## 2022-01-06 ENCOUNTER — Encounter (HOSPITAL_COMMUNITY): Payer: Medicare Other

## 2022-01-09 ENCOUNTER — Other Ambulatory Visit: Payer: Self-pay

## 2022-01-09 ENCOUNTER — Encounter (HOSPITAL_COMMUNITY)
Admission: RE | Admit: 2022-01-09 | Discharge: 2022-01-09 | Disposition: A | Discharge status: Home / Self Care | Payer: Medicare Other | Source: Ambulatory Visit | Attending: Cardiology | Admitting: Cardiology

## 2022-01-09 DIAGNOSIS — Z955 Presence of coronary angioplasty implant and graft: Secondary | ICD-10-CM

## 2022-01-10 NOTE — Progress Notes (Signed)
Cardiac Individual Treatment Plan  Patient Details  Name: Brenda Carney MRN: [id number] Date of Birth: [birth year] Referring Provider:   Flowsheet Row CARDIAC REHAB PHASE II ORIENTATION from 11/29/2021 in [facility]  Referring Provider Glenetta Hew, MD       Initial Encounter Date:  [place] PHASE II ORIENTATION from 11/29/2021 in [facility]  Date 11/29/21       Visit Diagnosis: 09/30/21 S/P DES RCA, CFx  Patient's Home Medications on Admission:  Current Outpatient Medications:    albuterol (VENTOLIN HFA) 108 (90 Base) MCG/ACT inhaler, Inhale 2 puffs into the lungs every 4 (four) hours as needed for wheezing or shortness of breath., Disp: , Rfl:    amLODipine (NORVASC) 10 MG tablet, Take 1 tablet (10 mg total) by mouth daily., Disp: 30 tablet, Rfl: 0   ANORO ELLIPTA 62.5-25 MCG/INH AEPB, Inhale 2 puffs into the lungs daily as needed (wheezing). , Disp: , Rfl:    Apoaequorin (PREVAGEN) 10 MG CAPS, Take 10 mg by mouth daily., Disp: , Rfl:    ARIPiprazole (ABILIFY) 2 MG tablet, Take 2 mg by mouth daily., Disp: , Rfl:    aspirin EC 81 MG tablet, Take 81 mg by mouth daily. Swallow whole., Disp: , Rfl:    buPROPion (WELLBUTRIN XL) 150 MG 24 hr tablet, Take 150 mg by mouth daily., Disp: , Rfl: 1   Calcium Carbonate-Vitamin D (CALCIUM-VITAMIN D3 PO), Take 1 tablet by mouth daily. 1200 mg / 5000 units, Disp: , Rfl:    Carboxymethylcellulose Sodium (THERATEARS) 0.25 % SOLN, Place 1 drop into both eyes 4 (four) times daily as needed (dry eye)., Disp: , Rfl:    clopidogrel (PLAVIX) 75 MG tablet, Take 1 tablet by mouth daily., Disp: 90 tablet, Rfl: 0   desvenlafaxine (PRISTIQ) 100 MG 24 hr tablet, Take 100 mg by mouth daily., Disp: , Rfl:    Estradiol 10 MCG TABS vaginal tablet, Place 10 mcg vaginally once a week. , Disp: , Rfl:    ferrous sulfate 325 (65 FE) MG tablet, Take 325 mg by mouth daily with breakfast.,  Disp: , Rfl:    Krill Oil 1000 MG CAPS, Take 1,000 mg by mouth daily., Disp: , Rfl:    lisinopril (ZESTRIL) 10 MG tablet, Take 10 mg by mouth daily., Disp: , Rfl:    Multiple Vitamin (MULTIVITAMIN WITH MINERALS) TABS tablet, Take 1 tablet by mouth daily. One-A-Day [place], Disp: , Rfl:    naphazoline-pheniramine (VISINE) 0.025-0.3 % ophthalmic solution, Place 1 drop into both eyes 4 (four) times daily as needed (Dry eye)., Disp: , Rfl:    Oxycodone HCl 10 MG TABS, Take 10 mg by mouth in the morning, at noon, in the evening, and at bedtime., Disp: , Rfl:    Propylene Glycol (SYSTANE COMPLETE) 0.6 % SOLN, Place 1 drop into both eyes 4 (four) times daily as needed (Dry eye)., Disp: , Rfl:    Propylhexedrine (BENZEDREX) INHA, Place 1 each into the nose daily., Disp: , Rfl:    REPATHA SURECLICK 678 MG/ML SOAJ, INJECT CONTENTS OF 1 PEN SUBCUTANEOUSLY EVERY 14 DAYS., Disp: 2 mL, Rfl: 11   traZODone (DESYREL) 50 MG tablet, Take 50 mg by mouth at bedtime., Disp: , Rfl:    zolpidem (AMBIEN) 10 MG tablet, Take 10 mg by mouth at bedtime. 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   Cervical spondylosis    Chronic lower back pain    COPD (chronic obstructive pulmonary disease) (HCC)    "little bit" (05/14/2017)   Coronary artery disease, non-occlusive    Coronary calcium scoring: June 2018: Score 1106. 98 percentile for age -> LOW RISK MYOVIEW   Depression    Exercise-induced asthma with acute exacerbation    "only in very hot weather" (05/14/2017)   GERD (gastroesophageal reflux disease)    History of IBS    resovlved   Hx of multiple concussions    from  horse training and riding   Hyperlipidemia with target LDL less than 70    Mostly statin intolerant,. 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   Pneumonia 2020   Sleep apnea    does not wear CPAP; "think it was medication related" (05/14/2017)   Stroke [facility]) 2010   TIA   Thoracic aortic ectasia (HCC)    ~40 mm on Coronary Calcium Score CT - recommend f/u CTA or MRA   TIA (transient ischemic attack) 09/2011   "several"    Tobacco Use: Social History   Tobacco Use  Smoking Status Some Days   Packs/day: 0.00   Years: 36.00   Pack years: 0.00   Types: Cigarettes  Smokeless Tobacco Never  Tobacco Comments   [name] says she smokes one cigarette every couple of weeks. [name] says she has a lot of friends who still smoke    Labs: Recent Review Flowsheet Data     Labs for ITP Cardiac and Pulmonary Rehab Latest Ref Rng & Units 05/15/2017 04/28/2018 08/13/2019 06/16/2020 09/08/2021   Cholestrol 100 - 199 mg/dL 251(H) - 205(H) - 153   LDLCALC 0 - 99 mg/dL 170(H) - 84 - 54   LDLDIRECT mg/dL - - - - -   HDL >39 mg/dL 47 - 61 - 79   Trlycerides 0 - 149 mg/dL 168(H) - 301(H) - 116   Hemoglobin A1c 4.8 - 5.6 % - - - - 5.4   TCO2 22 - 32 mmol/L - 23 - 27 -       Capillary Blood Glucose: Lab Results  Component Value Date   GLUCAP 109 (H) 04/30/2018   GLUCAP 110 (H) 04/29/2018   GLUCAP 107 (H) 09/05/2013   GLUCAP 110 (H) 10/01/2011     Exercise Target Goals: Exercise Program Goal: Individual exercise prescription set using results from initial 6 min walk test and THRR while considering  patients activity barriers and safety.   Exercise Prescription Goal: Starting with aerobic activity 30 plus minutes a day, 3 days per week for initial exercise prescription. Provide home exercise prescription and guidelines that  participant acknowledges understanding prior to discharge.  Activity Barriers & Risk Stratification:  Activity Barriers & Cardiac Risk Stratification - 11/29/21 1218       Activity Barriers & Cardiac Risk Stratification   Activity Barriers Back Problems;Left Hip Replacement;Right Hip Replacement;Neck/Spine Problems;Joint Problems;Shortness of Breath;Balance Concerns    Cardiac Risk Stratification High             6  Minute Walk:  6 Minute Walk     Row Name 11/29/21 1043         6 Minute Walk   Phase Initial     Distance 1508 feet     Walk Time 6 minutes     # of Rest Breaks 0     MPH 2.86     METS 3.7     RPE 12     Perceived Dyspnea  0     VO2 Peak 12.96     Symptoms Yes (comment)     Comments 5/10 low back pain on enrty. No back pain during walk test, however 7/10 left hip pain     Resting HR 96 bpm     Resting BP 122/84     Resting Oxygen Saturation  99 %     Exercise Oxygen Saturation  during 6 min walk 98 %     Max Ex. HR 112 bpm     Max Ex. BP 138/86     2 Minute Post BP 128/84              Oxygen Initial Assessment:   Oxygen Re-Evaluation:   Oxygen Discharge (Final Oxygen Re-Evaluation):   Initial Exercise Prescription:  Initial Exercise Prescription - 11/29/21 1200       Date of Initial Exercise RX and Referring Provider   Date 11/29/21    Referring Provider Glenetta Hew, MD    Expected Discharge Date 01/27/22      NuStep   Level 2    SPM 75    Minutes 30    METs 2.5      Prescription Details   Frequency (times per week) 3    Duration Progress to 30 minutes of continuous aerobic without signs/symptoms of physical distress      Intensity   THRR 40-80% of Max Heartrate 60-120    Ratings of Perceived Exertion 11-13    Perceived Dyspnea 0-4      Progression   Progression Continue progressive overload as per policy without signs/symptoms or physical distress.      Resistance Training   Training Prescription Yes    Weight 2 lbs     Reps 10-15             Perform Capillary Blood Glucose checks as needed.  Exercise Prescription Changes:   Exercise Prescription Changes     Row Name 12/21/21 1500 01/09/22 1500 01/10/22 0800         Response to Exercise   Blood Pressure (Admit) 134/84 104/60 --     Blood Pressure (Exercise) 140/80 136/84 --     Blood Pressure (Exit) 126/70 108/70 --     Heart Rate (Admit) 100 bpm 105 bpm --     Heart Rate (Exercise) 117 bpm 117 bpm --     Heart Rate (Exit) 100 bpm 101 bpm --     Rating of Perceived Exertion (Exercise) 9 11 --     Symptoms None None --     Comments Pt's first day in the CRP2 program Reviwed METs --     Duration Continue with 30 min of aerobic exercise without signs/symptoms of physical distress. Continue with 30 min of aerobic exercise without signs/symptoms of physical distress. --     Intensity THRR unchanged THRR unchanged --       Progression   Progression Continue to progress workloads to maintain intensity without signs/symptoms of physical distress. Continue to progress workloads to maintain intensity without signs/symptoms  of physical distress. --     Average METs 1.8 2 --       Resistance Training   Training Prescription No Yes --     Weight No weights on Wednesdays 2 Lbs --     Reps -- 10-15 --     Time -- 10 Minutes --       Interval Training   Interval Training No No --       NuStep   Level 2 2 --     SPM 70 80 --     Minutes 25 30 --     METs 1.8 2 --              Exercise Comments:   Exercise Comments     Row Name 12/21/21 1559 01/09/22 1500         Exercise Comments Pt's first day in the CRP2 program. No complaints with todays session. Reviewed METs. Did not increase workload. Encouraged patient to increase SPM. Will continue to monitor SPM and progress workload once she is able to keep SPM above 85.               Exercise Goals and Review:   Exercise Goals     Row Name 11/29/21 1220              Exercise Goals   Increase Physical Activity Yes       Intervention Provide advice, education, support and counseling about physical activity/exercise needs.;Develop an individualized exercise prescription for aerobic and resistive training based on initial evaluation findings, risk stratification, comorbidities and participant's personal goals.       Expected Outcomes Short Term: Attend rehab on a regular basis to increase amount of physical activity.;Long Term: Add in home exercise to make exercise part of routine and to increase amount of physical activity.;Long Term: Exercising regularly at least 3-5 days a week.       Increase Strength and Stamina Yes       Intervention Provide advice, education, support and counseling about physical activity/exercise needs.;Develop an individualized exercise prescription for aerobic and resistive training based on initial evaluation findings, risk stratification, comorbidities and participant's personal goals.       Expected Outcomes Short Term: Increase workloads from initial exercise prescription for resistance, speed, and METs.;Short Term: Perform resistance training exercises routinely during rehab and add in resistance training at home;Long Term: Improve cardiorespiratory fitness, muscular endurance and strength as measured by increased METs and functional capacity (6MWT)       Able to understand and use rate of perceived exertion (RPE) scale Yes       Intervention Provide education and explanation on how to use RPE scale       Expected Outcomes Short Term: Able to use RPE daily in rehab to express subjective intensity level;Long Term:  Able to use RPE to guide intensity level when exercising independently       Knowledge and understanding of Target Heart Rate Range (THRR) Yes       Expected Outcomes Short Term: Able to state/look up THRR;Long Term: Able to use THRR to govern intensity when exercising independently;Short Term: Able to use daily as guideline for  intensity in rehab       Understanding of Exercise Prescription Yes       Intervention Provide education, explanation, and written materials on patient's individual exercise prescription       Expected Outcomes Short Term: Able to explain program exercise prescription;Long Term: Able to  explain home exercise prescription to exercise independently                Exercise Goals Re-Evaluation :  Exercise Goals Re-Evaluation     [place] Name 12/21/21 1543             Exercise Goal Re-Evaluation   Exercise Goals Review Increase Physical Activity;Increase Strength and Stamina;Able to understand and use rate of perceived exertion (RPE) scale;Knowledge and understanding of Target Heart Rate Range (THRR);Understanding of Exercise Prescription       Comments Pt's first day in the cRP2 program. Pt understnads the exercise Rx, THRR and RPE scale.       Expected Outcomes Will continue to monitor and progress exercise workloads as tolerated.                 Discharge Exercise Prescription (Final Exercise Prescription Changes):  Exercise Prescription Changes - 01/10/22 0800       Response to Exercise   Blood Pressure (Admit) --    Blood Pressure (Exercise) --    Blood Pressure (Exit) --    Heart Rate (Admit) --    Heart Rate (Exercise) --    Heart Rate (Exit) --    Rating of Perceived Exertion (Exercise) --    Symptoms --    Comments --    Duration --    Intensity --      Progression   Progression --    Average METs --      Resistance Training   Training Prescription --    Weight --    Reps --    Time --      Interval Training   Interval Training --      NuStep   Level --    SPM --    Minutes --    METs --             Nutrition:  Target Goals: Understanding of nutrition guidelines, daily intake of sodium 1500mg , cholesterol 200mg , calories 30% from fat and 7% or less from saturated fats, daily to have 5 or more servings of fruits and  vegetables.  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 40 Therapeutic Level Cholesterol Diet   Picture Your Plate Scores: <67 Unhealthy dietary pattern with much room for improvement. 41-50 Dietary pattern unlikely to meet recommendations for good health and room for improvement. 51-60 More healthful dietary pattern, with some room for improvement.  >60 Healthy dietary pattern, although there may be some specific behaviors that could be improved.    Nutrition Goals Re-Evaluation:   Nutrition Goals Discharge (Final Nutrition Goals Re-Evaluation):   Psychosocial: Target Goals: Acknowledge presence or absence of significant depression and/or stress, maximize coping skills, provide positive support system. Participant is able to verbalize types and ability to use techniques and skills needed  for reducing stress and depression.  Initial Review & Psychosocial Screening:  Initial Psych Review & Screening - 11/29/21 1511       Initial Review   Current issues with History of Depression      Family Dynamics   Good Support System? Yes   [name] lives alone. [name] has friends she can depend on in the area as [name] is a widow     Barriers   Psychosocial barriers to participate in program The patient should benefit from training in stress management and relaxation.      Screening Interventions   Interventions Encouraged to exercise;Provide feedback about the scores to participant    Expected Outcomes Short Term  goal: Identification and review with participant of any Quality of Life or Depression concerns found by scoring the questionnaire.;Long Term Goal: Stressors or current issues are controlled or eliminated.;Long Term goal: The participant improves quality of Life and PHQ9 Scores as seen by post scores and/or verbalization of changes             Quality of Life Scores:  Quality of Life - 11/29/21 1210       Quality of Life   Select Quality of Life      Quality of Life Scores   Health/Function Pre 22.17 %    Socioeconomic Pre 28.63 %    Psych/Spiritual Pre 24.79 %    Family Pre --   Pt placed NA on family questions so no score was calculated   GLOBAL Pre 27.76 %            Scores of 19 and below usually indicate a poorer quality of life in these areas.  A difference of  2-3 points is a clinically meaningful difference.  A difference of 2-3 points in the total score of the Quality of Life Index has been associated with significant improvement in overall quality of life, self-image, physical symptoms, and general health in studies assessing change in quality of life.  PHQ-9: Recent Review Flowsheet Data     Depression screen [facility] 2/9 11/29/2021   Decreased Interest 0   Down, Depressed, Hopeless 0   PHQ - 2 Score 0      Interpretation of Total Score  Total Score Depression Severity:  1-4 = Minimal depression, 5-9 = Mild depression, 10-14 = Moderate depression, 15-19 = Moderately severe depression, 20-27 = Severe depression   Psychosocial Evaluation and Intervention:   Psychosocial Re-Evaluation:  Psychosocial Re-Evaluation     [place] Name 12/22/21 1002 01/10/22 1630           Psychosocial Re-Evaluation   Current issues with History of Depression History of Depression      Comments [name] started cardiac rehab on 12/21/21 no concerns voiced [name] has not voiced any increased psychosocial concerns or depression since participating in phase 2 cardiac rehab.      Expected Outcomes  [name]'s depression will continue to be controlled upon completion of phase 2 cardiac rehab [name]'s depression will continue to be controlled upon completion of phase 2 cardiac rehab      Interventions Encouraged to attend Cardiac Rehabilitation for the exercise Encouraged to attend Cardiac Rehabilitation for the exercise      Continue Psychosocial Services  No Follow up required No Follow up required               Psychosocial Discharge (Final Psychosocial Re-Evaluation):  Psychosocial Re-Evaluation - 01/10/22 1630       Psychosocial Re-Evaluation   Current issues with  History of Depression    Comments [name] has not voiced any increased psychosocial concerns or depression since participating in phase 2 cardiac rehab.    Expected Outcomes [name]'s depression will continue to be controlled upon completion of phase 2 cardiac rehab    Interventions Encouraged to attend Cardiac Rehabilitation for the exercise    Continue Psychosocial Services  No Follow up required             Vocational Rehabilitation: Provide vocational rehab assistance to qualifying candidates.   Vocational Rehab Evaluation & Intervention:  Vocational Rehab - 11/29/21 1513       Initial Vocational Rehab Evaluation & Intervention   Assessment shows need for Vocational Rehabilitation No   [name] is semi retired and does not need vocational rehab at this time            Education: Education Goals: Education classes will be provided on a weekly basis, covering required topics. Participant will state understanding/return demonstration of topics presented.  Learning Barriers/Preferences:  Learning Barriers/Preferences - 11/29/21 1212       Learning Barriers/Preferences   Learning Barriers None    Learning Preferences Audio;Written Material;Computer/Internet;Group Instruction;Individual Instruction;Pictoral;Skilled Demonstration;Verbal Instruction;Video             Education Topics: Hypertension, Hypertension  Reduction -Define heart disease and high blood pressure. Discus how high blood pressure affects the body and ways to reduce high blood pressure.   Exercise and Your Heart -Discuss why it is important to exercise, the FITT principles of exercise, normal and abnormal responses to exercise, and how to exercise safely.   Angina -Discuss definition of angina, causes of angina, treatment of angina, and how to decrease risk of having angina.   Cardiac Medications -Review what the following cardiac medications are used for, how they affect the body, and side effects that may occur when taking the medications.  Medications include Aspirin, Beta blockers, calcium channel blockers, ACE Inhibitors, angiotensin receptor blockers, diuretics, digoxin, and antihyperlipidemics.   Congestive Heart Failure -Discuss the definition of CHF, how to live with CHF, the signs and symptoms of CHF, and how keep track of weight and sodium intake.   Heart Disease and Intimacy -Discus the effect sexual activity has on the heart, how changes occur during intimacy as we age, and safety during sexual activity.   Smoking Cessation / COPD -Discuss different methods to quit smoking, the health benefits of quitting smoking, and the definition of COPD.   Nutrition I: Fats -Discuss the types of cholesterol, what cholesterol does to the heart, and how cholesterol levels can be controlled.   Nutrition II: Labels -Discuss the different components of food labels and how to read food label   Heart Parts/Heart Disease and PAD -Discuss the anatomy of the heart, the pathway of blood circulation through the heart, and these are affected by heart disease.   Stress I: Signs and Symptoms -Discuss the causes of stress, how stress may lead to anxiety and depression, and ways to limit stress.   Stress II: Relaxation -Discuss different types of relaxation techniques to limit stress.   Warning Signs of Stroke / TIA -Discuss  definition of a stroke, what the signs and symptoms are of a stroke, and how to identify when someone is having stroke.   Knowledge Questionnaire Score:  Knowledge Questionnaire Score - 11/29/21 1213       Knowledge Questionnaire Score   Pre Score 23/28             Core Components/Risk  Factors/Patient Goals at Admission:  Personal Goals and Risk Factors at Admission - 11/29/21 1213       Core Components/Risk Factors/Patient Goals on Admission    Weight Management Yes;Weight Maintenance    Intervention Weight Management: Develop a combined nutrition and exercise program designed to reach desired caloric intake, while maintaining appropriate intake of nutrient and fiber, sodium and fats, and appropriate energy expenditure required for the weight goal.;Weight Management: Provide education and appropriate resources to help participant work on and attain dietary goals.    Expected Outcomes Weight Maintenance: Understanding of the daily nutrition guidelines, which includes 25-35% calories from fat, 7% or less cal from saturated fats, less than 200mg  cholesterol, less than 1.5gm of sodium, & 5 or more servings of fruits and vegetables daily    Tobacco Cessation Yes    Number of packs per day one every couple of weeks    Intervention Assist the participant in steps to quit. Provide individualized education and counseling about committing to Tobacco Cessation, relapse prevention, and pharmacological support that can be provided by physician.;Advice worker, assist with locating and accessing local/national Quit Smoking programs, and support quit date choice.    Expected Outcomes Short Term: Will demonstrate readiness to quit, by selecting a quit date.;Short Term: Will quit all tobacco product use, adhering to prevention of relapse plan.;Long Term: Complete abstinence from all tobacco products for at least 12 months from quit date.    Hypertension Yes    Intervention Provide  education on lifestyle modifcations including regular physical activity/exercise, weight management, moderate sodium restriction and increased consumption of fresh fruit, vegetables, and low fat dairy, alcohol moderation, and smoking cessation.;Monitor prescription use compliance.    Expected Outcomes Short Term: Continued assessment and intervention until BP is < 140/44mm HG in hypertensive participants. < 130/31mm HG in hypertensive participants with diabetes, heart failure or chronic kidney disease.;Long Term: Maintenance of blood pressure at goal levels.    Lipids Yes    Intervention Provide education and support for participant on nutrition & aerobic/resistive exercise along with prescribed medications to achieve LDL 70mg , HDL >40mg .    Expected Outcomes Long Term: Cholesterol controlled with medications as prescribed, with individualized exercise RX and with personalized nutrition plan. Value goals: LDL < 70mg , HDL > 40 mg.;Short Term: Participant states understanding of desired cholesterol values and is compliant with medications prescribed. Participant is following exercise prescription and nutrition guidelines.    Personal Goal Other Yes    Personal Goal Walk with less SOB    Intervention Exercise 3x/week in the CRP2 program    Expected Outcomes Pt will have improved exercise tolerance and less SOB with walking.             Core Components/Risk Factors/Patient Goals Review:   Goals and Risk Factor Review     Row Name 12/22/21 1004 01/10/22 1632           Core Components/Risk Factors/Patient Goals Review   Personal Goals Review Weight Management/Obesity;Hypertension;Lipids;Tobacco Cessation Weight Management/Obesity;Hypertension;Lipids;Tobacco Cessation      Review [name] started cardiac rehab on 12/21/21 and did well with exercise. vital signs were stable. [name] has been doing well with exercise at cardiac rehab. Vital signs have been stable. No further NSVT has been noted. [name]  continues to smoke.      Expected Outcomes [name] will continue to participate in phase 2 cardiac rehab for exercise, nutrition and lifestyle modifications [name] will continue to participate in phase 2 cardiac rehab for exercise, nutrition and lifestyle  modifications               Core Components/Risk Factors/Patient Goals at Discharge (Final Review):   Goals and Risk Factor Review - 01/10/22 1632       Core Components/Risk Factors/Patient Goals Review   Personal Goals Review Weight Management/Obesity;Hypertension;Lipids;Tobacco Cessation    Review [name] has been doing well with exercise at cardiac rehab. Vital signs have been stable. No further NSVT has been noted. [name] continues to smoke.    Expected Outcomes [name] will continue to participate in phase 2 cardiac rehab for exercise, nutrition and lifestyle modifications             ITP Comments:  ITP Comments     Row Name 11/29/21 1317 12/14/21 1418 01/10/22 1630       ITP Comments Dr Fransico Him MD, Medical Director 30 Day ITP Review. [name] is to begin exercise on 12/21/21 30 Day ITP Review. [name] has good attendance and participation in phase 2 cardiac rehab.              Comments: See ITP comments.Harrell Gave RN BSN

## 2022-01-11 ENCOUNTER — Other Ambulatory Visit: Payer: Self-pay

## 2022-01-11 ENCOUNTER — Encounter (HOSPITAL_COMMUNITY)
Admission: RE | Admit: 2022-01-11 | Discharge: 2022-01-11 | Disposition: A | Discharge status: Home / Self Care | Payer: Medicare Other | Source: Ambulatory Visit | Attending: Cardiology | Admitting: Cardiology

## 2022-01-11 ENCOUNTER — Telehealth: Payer: Self-pay | Admitting: Cardiology

## 2022-01-11 DIAGNOSIS — Z955 Presence of coronary angioplasty implant and graft: Secondary | ICD-10-CM

## 2022-01-11 NOTE — Telephone Encounter (Signed)
° ° °  Reviewed rhythm strips from cardiac rehab showing sinus rhythm with rates in the 90s to low 100s at rest.  With known documented CAD, would like to start beta-blocker.  Recommendation: Reduce amlodipine dose to 5 mg Start bisoprolol 5 mg daily -> Rx #30, 11 refills  She is due to see Coletta Memos, NP in follow-up in 25 January 2015.  Can reassess heart rate range at that time.  May need to titrate based on evaluation.   Glenetta Hew, MD

## 2022-01-11 NOTE — Progress Notes (Signed)
Resting heart rate noted at 122 initially. Brenda Carney admitted to smoking a cigarette 30 minutes prior to coming to exercise. Brenda Carney says she smokes about 10 cigarettes a day. Brenda Carney started exercise after heart rate came down to 106. Sinus tach. I encouraged Brenda Carney not smoke cigarettes at least 2 hours before coming to exercise at cardiac rehab. Patient states understanding. Exit heart rate noted at 95. Will send exercise flow sheets with ECG tracings for Dr Ellyn Hack to review. Brenda Carney's resting heart rate has been noted in the low 100's prior to coming to exercise. Blood pressures have been with in normal limits. Brenda Carney proceeded to exercise at cardiac rehab without complaints or symptoms.Will continue to monitor the patient throughout  the program. Barnet Pall, RN,BSN 01/11/2022 2:29 PM

## 2022-01-13 ENCOUNTER — Other Ambulatory Visit: Payer: Self-pay

## 2022-01-13 ENCOUNTER — Encounter (HOSPITAL_COMMUNITY)
Admission: RE | Admit: 2022-01-13 | Discharge: 2022-01-13 | Disposition: A | Discharge status: Home / Self Care | Payer: Medicare Other | Source: Ambulatory Visit | Attending: Cardiology | Admitting: Cardiology

## 2022-01-13 DIAGNOSIS — Z955 Presence of coronary angioplasty implant and graft: Secondary | ICD-10-CM

## 2022-01-16 ENCOUNTER — Encounter (HOSPITAL_COMMUNITY)
Admission: RE | Admit: 2022-01-16 | Discharge: 2022-01-16 | Disposition: A | Discharge status: Home / Self Care | Payer: Medicare Other | Source: Ambulatory Visit | Attending: Cardiology | Admitting: Cardiology

## 2022-01-16 ENCOUNTER — Other Ambulatory Visit: Payer: Self-pay

## 2022-01-16 DIAGNOSIS — Z955 Presence of coronary angioplasty implant and graft: Secondary | ICD-10-CM

## 2022-01-18 ENCOUNTER — Encounter (HOSPITAL_COMMUNITY)
Admission: RE | Admit: 2022-01-18 | Discharge: 2022-01-18 | Disposition: A | Discharge status: Home / Self Care | Payer: Medicare Other | Source: Ambulatory Visit | Attending: Cardiology | Admitting: Cardiology

## 2022-01-18 ENCOUNTER — Other Ambulatory Visit: Payer: Self-pay

## 2022-01-18 DIAGNOSIS — Z955 Presence of coronary angioplasty implant and graft: Secondary | ICD-10-CM

## 2022-01-19 MED ORDER — BISOPROLOL FUMARATE 5 MG PO TABS: 5.0000 mg | ORAL_TABLET | Freq: Every day | ORAL | 6 refills | Status: DC

## 2022-01-19 MED ORDER — AMLODIPINE BESYLATE 10 MG PO TABS: 5.0000 mg | ORAL_TABLET | Freq: Every day | ORAL | 0 refills | Status: DC

## 2022-01-19 NOTE — Telephone Encounter (Signed)
Called spoke to  patient . She is aware  to decrease Amlodipine to 5 mg ( 1/2 tablet of 10 mg)  will start Bisoprolol 5 mg  new prescription sent to pharmacy .  Keep appointment with Coletta Memos NP

## 2022-01-19 NOTE — Addendum Note (Signed)
Addended by: Raiford Simmonds on: 01/19/2022 03:05 PM   Modules accepted: Orders

## 2022-01-20 ENCOUNTER — Encounter (HOSPITAL_COMMUNITY)
Admission: RE | Admit: 2022-01-20 | Discharge: 2022-01-20 | Disposition: A | Discharge status: Home / Self Care | Payer: Medicare Other | Source: Ambulatory Visit | Attending: Cardiology | Admitting: Cardiology

## 2022-01-20 ENCOUNTER — Other Ambulatory Visit: Payer: Self-pay

## 2022-01-20 DIAGNOSIS — Z955 Presence of coronary angioplasty implant and graft: Secondary | ICD-10-CM | POA: Diagnosis not present

## 2022-01-20 NOTE — Progress Notes (Signed)
Reviewed home exercise Rx with patient today. Encouraged warm-up, cool-down and stretching. Reviewed THR of 60-120 and keeping REP between 11-13. Discussed weather parameters for temperature and humidity for safe exercise outdoors. Reviewed S/S to terminate exercise and when to MD vs 911. Pt verbalized understanding of the home exercise Rx and was provided a copy.  Lesly Rubenstein MS, ACSM-CEP, CCRP

## 2022-01-23 ENCOUNTER — Telehealth (HOSPITAL_COMMUNITY): Payer: Self-pay | Admitting: Internal Medicine

## 2022-01-23 ENCOUNTER — Encounter (HOSPITAL_COMMUNITY): Payer: Medicare Other

## 2022-01-25 ENCOUNTER — Encounter (HOSPITAL_COMMUNITY)
Admission: RE | Admit: 2022-01-25 | Discharge: 2022-01-25 | Disposition: A | Discharge status: Home / Self Care | Payer: Medicare Other | Source: Ambulatory Visit | Attending: Cardiology | Admitting: Cardiology

## 2022-01-25 ENCOUNTER — Other Ambulatory Visit: Payer: Self-pay

## 2022-01-25 VITALS — Ht 64.0 in | Wt 128.1 lb

## 2022-01-25 DIAGNOSIS — Z955 Presence of coronary angioplasty implant and graft: Secondary | ICD-10-CM | POA: Insufficient documentation

## 2022-01-25 DIAGNOSIS — Z48812 Encounter for surgical aftercare following surgery on the circulatory system: Secondary | ICD-10-CM | POA: Insufficient documentation

## 2022-01-27 ENCOUNTER — Encounter (HOSPITAL_COMMUNITY)
Admission: RE | Admit: 2022-01-27 | Discharge: 2022-01-27 | Disposition: A | Discharge status: Home / Self Care | Payer: Medicare Other | Source: Ambulatory Visit | Attending: Cardiology | Admitting: Cardiology

## 2022-01-27 ENCOUNTER — Other Ambulatory Visit: Payer: Self-pay

## 2022-01-27 DIAGNOSIS — Z955 Presence of coronary angioplasty implant and graft: Secondary | ICD-10-CM | POA: Diagnosis not present

## 2022-01-27 DIAGNOSIS — Z48812 Encounter for surgical aftercare following surgery on the circulatory system: Secondary | ICD-10-CM | POA: Diagnosis not present

## 2022-01-27 NOTE — Progress Notes (Signed)
Discharge Progress Report  Patient Details  Name: Brenda Carney MRN: [id number] Date of Birth: [birth year] Referring Provider:   Flowsheet Row CARDIAC REHAB PHASE II ORIENTATION from 11/29/2021 in [facility]  Referring Provider Glenetta Hew, MD        Number of Visits: 13   Reason for Discharge:  Patient reached a stable level of exercise.  Smoking History:  Social History   Tobacco Use  Smoking Status Some Days   Packs/day: 0.00   Years: 36.00   Pack years: 0.00   Types: Cigarettes  Smokeless Tobacco Never  Tobacco Comments   Brenda Carney says she smokes one cigarette every couple of weeks. 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, CFx  ADL UCSD:   Initial Exercise Prescription:  Initial Exercise Prescription - 11/29/21 1200       Date of Initial Exercise RX and Referring Provider   Date 11/29/21    Referring Provider Glenetta Hew, MD    Expected Discharge Date 01/27/22      NuStep   Level 2    SPM 75    Minutes 30    METs 2.5      Prescription Details   Frequency (times per week) 3    Duration Progress to 30 minutes of continuous aerobic without signs/symptoms of physical distress      Intensity   THRR 40-80% of Max Heartrate 60-120    Ratings of Perceived Exertion 11-13    Perceived Dyspnea 0-4      Progression   Progression Continue progressive overload as per policy without signs/symptoms or physical distress.      Resistance Training   Training Prescription Yes    Weight 2 lbs    Reps 10-15             Discharge Exercise Prescription (Final Exercise Prescription Changes):  Exercise Prescription Changes - 01/27/22 1400       Response to Exercise   Blood Pressure (Admit) 108/64    Blood Pressure (Exercise) 104/60    Blood Pressure (Exit) 102/60    Heart Rate (Admit) 76 bpm    Heart Rate (Exercise) 89 bpm    Heart Rate (Exit) 76 bpm    Rating of Perceived  Exertion (Exercise) 11    Symptoms Right shoulder pain 7/10 (with movement)    Comments Pt graduated form the CRP2 program today.    Duration Continue with 30 min of aerobic exercise without signs/symptoms of physical distress.    Intensity THRR unchanged      Progression   Progression Continue to progress workloads to maintain intensity without signs/symptoms of physical distress.    Average METs 2.2   Pt did not use right arm     Resistance Training   Training Prescription No      Interval Training   Interval Training No      NuStep   Level 2    SPM 80    Minutes 30    METs 2.2      Home Exercise Plan   Plans to continue exercise at Home (comment)    Frequency Add 2 additional days to program exercise sessions.    Initial Home Exercises Provided 01/18/22             Functional Capacity:  6 Minute Walk     Row Name 11/29/21 1043 01/25/22 1310       6  Minute Walk   Phase Initial Discharge    Distance 1508 feet 1222 feet    Distance % Change -- -18.97 %  Pt had 7/10 left hip pain today which impacted her ability to walk at a faster pace.    Distance Feet Change -- -286 ft    Walk Time 6 minutes 6 minutes    # of Rest Breaks 0 0    MPH 2.86 2.31    METS 3.7 2.83    RPE 12 8    Perceived Dyspnea  0 0    VO2 Peak 12.96 9.89    Symptoms Yes (comment) Yes (comment)    Comments 5/10 low back pain on enrty. No back pain during walk test, however 7/10 left hip pain 7/10 left hip pain    Resting HR 96 bpm 68 bpm    Resting BP 122/84 118/64    Resting Oxygen Saturation  99 % --    Exercise Oxygen Saturation  during 6 min walk 98 % --    Max Ex. HR 112 bpm 84 bpm    Max Ex. BP 138/86 128/74    2 Minute Post BP 128/84 --             Psychological, QOL, Others - Outcomes: PHQ 2/9: Depression screen [facility] 2/9 01/27/2022 11/29/2021  Decreased Interest 0 0  Down, Depressed, Hopeless 0 0  PHQ - 2 Score 0 0  Some recent data might be hidden    Quality of Life:   Quality of Life - 01/27/22 1535       Quality of Life   Select Quality of Life      Quality of Life Scores   Health/Function Pre 22.17 %    Health/Function Post 23.11 %    Health/Function % Change 4.24 %    Socioeconomic Pre 28.63 %    Socioeconomic Post 28 %    Socioeconomic % Change  -2.2 %    Psych/Spiritual Pre 24.79 %    Psych/Spiritual Post 23.21 %    Psych/Spiritual % Change -6.37 %    Family Post --   No value   GLOBAL Pre 27.76 %    GLOBAL Post 24.48 %    GLOBAL % Change -11.82 %             Personal Goals: Goals established at orientation with interventions provided to work toward goal.  Personal Goals and Risk Factors at Admission - 11/29/21 1213       Core Components/Risk Factors/Patient Goals on Admission    Weight Management Yes;Weight Maintenance    Intervention Weight Management: Develop a combined nutrition and exercise program designed to reach desired caloric intake, while maintaining appropriate intake of nutrient and fiber, sodium and fats, and appropriate energy expenditure required for the weight goal.;Weight Management: Provide education and appropriate resources to help participant work on and attain dietary goals.    Expected Outcomes Weight Maintenance: Understanding of the daily nutrition guidelines, which includes 25-35% calories from fat, 7% or less cal from saturated fats, less than 283m cholesterol, less than 1.5gm of sodium, & 5 or more servings of fruits and vegetables daily    Tobacco Cessation Yes    Number of packs per day one every couple of weeks    Intervention Assist the participant in steps to quit. Provide individualized education and counseling about committing to Tobacco Cessation, relapse prevention, and pharmacological support that can be provided by physician.;OAdvice worker assist with locating and accessing local/national  Quit Smoking programs, and support quit date choice.    Expected Outcomes Short Term: Will  demonstrate readiness to quit, by selecting a quit date.;Short Term: Will quit all tobacco product use, adhering to prevention of relapse plan.;Long Term: Complete abstinence from all tobacco products for at least 12 months from quit date.    Hypertension Yes    Intervention Provide education on lifestyle modifcations including regular physical activity/exercise, weight management, moderate sodium restriction and increased consumption of fresh fruit, vegetables, and low fat dairy, alcohol moderation, and smoking cessation.;Monitor prescription use compliance.    Expected Outcomes Short Term: Continued assessment and intervention until BP is < 140/3m HG in hypertensive participants. < 130/836mHG in hypertensive participants with diabetes, heart failure or chronic kidney disease.;Long Term: Maintenance of blood pressure at goal levels.    Lipids Yes    Intervention Provide education and support for participant on nutrition & aerobic/resistive exercise along with prescribed medications to achieve LDL <7067mHDL >56m3m  Expected Outcomes Long Term: Cholesterol controlled with medications as prescribed, with individualized exercise RX and with personalized nutrition plan. Value goals: LDL < 70mg46mL > 40 mg.;Short Term: Participant states understanding of desired cholesterol values and is compliant with medications prescribed. Participant is following exercise prescription and nutrition guidelines.    Personal Goal Other Yes    Personal Goal Walk with less SOB    Intervention Exercise 3x/week in the CRP2 program    Expected Outcomes Pt will have improved exercise tolerance and less SOB with walking.              Personal Goals Discharge:  Goals and Risk Factor Review     Row Name 12/22/21 1004 01/10/22 1632 02/09/22 1454         Core Components/Risk Factors/Patient Goals Review   Personal Goals Review Weight Management/Obesity;Hypertension;Lipids;Tobacco Cessation Weight  Management/Obesity;Hypertension;Lipids;Tobacco Cessation Weight Management/Obesity;Hypertension;Lipids;Tobacco Cessation     Review Brenda Carney started cardiac rehab on 12/21/21 and did well with exercise. vital signs were stable. Brenda Carney has been doing well with exercise at cardiac rehab. Vital signs have been stable. No further NSVT has been noted. Brenda Carney continues to smoke. Brenda Carney completed cardiac rehab on 01/27/22     Expected Outcomes Brenda Carney will continue to participate in phase 2 cardiac rehab for exercise, nutrition and lifestyle modifications Brenda Carney will continue to participate in phase 2 cardiac rehab for exercise, nutrition and lifestyle modifications Brenda Carney will continue to  exercise,follow nutrition and lifestyle modifications upon completion of phase 2 cardiac rehab.              Exercise Goals and Review:  Exercise Goals     Row Name 11/29/21 1220             Exercise Goals   Increase Physical Activity Yes       Intervention Provide advice, education, support and counseling about physical activity/exercise needs.;Develop an individualized exercise prescription for aerobic and resistive training based on initial evaluation findings, risk stratification, comorbidities and participant's personal goals.       Expected Outcomes Short Term: Attend rehab on a regular basis to increase amount of physical activity.;Long Term: Add in home exercise to make exercise part of routine and to increase amount of physical activity.;Long Term: Exercising regularly at least 3-5 days a week.       Increase Strength and Stamina Yes       Intervention Provide advice, education, support and counseling about physical activity/exercise needs.;Develop an individualized exercise prescription for aerobic  and resistive training based on initial evaluation findings, risk stratification, comorbidities and participant's personal goals.       Expected Outcomes Short Term: Increase workloads from initial exercise prescription for  resistance, speed, and METs.;Short Term: Perform resistance training exercises routinely during rehab and add in resistance training at home;Long Term: Improve cardiorespiratory fitness, muscular endurance and strength as measured by increased METs and functional capacity (6MWT)       Able to understand and use rate of perceived exertion (RPE) scale Yes       Intervention Provide education and explanation on how to use RPE scale       Expected Outcomes Short Term: Able to use RPE daily in rehab to express subjective intensity level;Long Term:  Able to use RPE to guide intensity level when exercising independently       Knowledge and understanding of Target Heart Rate Range (THRR) Yes       Expected Outcomes Short Term: Able to state/look up THRR;Long Term: Able to use THRR to govern intensity when exercising independently;Short Term: Able to use daily as guideline for intensity in rehab       Understanding of Exercise Prescription Yes       Intervention Provide education, explanation, and written materials on patient's individual exercise prescription       Expected Outcomes Short Term: Able to explain program exercise prescription;Long Term: Able to explain home exercise prescription to exercise independently                Exercise Goals Re-Evaluation:  Exercise Goals Re-Evaluation     Row Name 12/21/21 1543 01/18/22 1500 01/20/22 1200 01/27/22 1438       Exercise Goal Re-Evaluation   Exercise Goals Review Increase Physical Activity;Increase Strength and Stamina;Able to understand and use rate of perceived exertion (RPE) scale;Knowledge and understanding of Target Heart Rate Range (THRR);Understanding of Exercise Prescription Increase Physical Activity;Increase Strength and Stamina;Able to understand and use rate of perceived exertion (RPE) scale;Knowledge and understanding of Target Heart Rate Range (THRR);Understanding of Exercise Prescription -- Increase Physical Activity;Increase  Strength and Stamina;Able to understand and use rate of perceived exertion (RPE) scale;Knowledge and understanding of Target Heart Rate Range (THRR);Understanding of Exercise Prescription    Comments Pt's first day in the cRP2 program. Pt understnads the exercise Rx, THRR and RPE scale. Reviewed Home exericse Rx and goals. Pt has purchases a recumbent stepper and will use it at home 2-3 x/week for 30 minutes in addtion to the [place] program. -- Pt graduated form the [place] program. Pt made some minimal progress with MET progression from 1.8 to a peak of 2.6. Pt plans to continue her exercise at home as she has purchased a recumbent stepper.    Expected Outcomes Will continue to monitor and progress exercise workloads as tolerated. Pt will begin to use her stepper at home. -- Pt will exercise at home.             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   Single Leg Stand 4.18 seconds   Hight fall risk  Post Biometrics - 01/25/22 1430        Post  Biometrics   Height '5\' 4"'  (1.626 m)    Weight 58.1 kg    Waist Circumference 34.25 inches    Hip Circumference 37 inches    Waist to Hip Ratio 0.93 %    BMI (Calculated) 21.98    Triceps Skinfold 9 mm    % Body Fat 30.1 %    Grip Strength 26 kg    Flexibility --   Not done   Single Leg Stand 5.43 seconds             Nutrition:   Nutrition Discharge:   Education Questionnaire Score:  Knowledge Questionnaire Score - 01/27/22 1535       Knowledge Questionnaire Score   Post Score 26/28             Goals reviewed with patient; copy given to patient.Brenda Carney graduated from cardiac rehab program on 01/222 with completion of 13  exercise  sessions in Phase II. Pt maintained good attendance and progressed nicely during her participation in rehab as evidenced by increased MET level.   Medication list reconciled. Repeat  PHQ score- 0 .  Pt has made significant lifestyle changes and should be commended for her success. Pt feels she has achieved her goals during cardiac rehab.   Pt plans to continue exercise by walking weather permitting. Brenda Carney says she has purchased a nustep for exercise. Brenda Carney continues to smoke. Brenda Carney says she smokes less since when she started the program. Brenda Carney talks to  [facility] quitline to assist with smoking cessation.  Brenda Carney did decrease her distance on her post exercise walk test as she had left hip pain that day.Barnet Pall, RN,BSN 02/09/2022 2:56 PM

## 2022-01-31 DIAGNOSIS — M87059 Idiopathic aseptic necrosis of unspecified femur: Secondary | ICD-10-CM | POA: Diagnosis not present

## 2022-01-31 DIAGNOSIS — G894 Chronic pain syndrome: Secondary | ICD-10-CM | POA: Diagnosis not present

## 2022-01-31 DIAGNOSIS — M47817 Spondylosis without myelopathy or radiculopathy, lumbosacral region: Secondary | ICD-10-CM | POA: Diagnosis not present

## 2022-01-31 DIAGNOSIS — M533 Sacrococcygeal disorders, not elsewhere classified: Secondary | ICD-10-CM | POA: Diagnosis not present

## 2022-02-05 ENCOUNTER — Other Ambulatory Visit: Payer: Self-pay | Admitting: Vascular Surgery

## 2022-02-06 ENCOUNTER — Other Ambulatory Visit: Payer: Self-pay

## 2022-02-06 DIAGNOSIS — I739 Peripheral vascular disease, unspecified: Secondary | ICD-10-CM | POA: Insufficient documentation

## 2022-02-06 DIAGNOSIS — K2100 Gastro-esophageal reflux disease with esophagitis, without bleeding: Secondary | ICD-10-CM | POA: Insufficient documentation

## 2022-02-06 DIAGNOSIS — M25551 Pain in right hip: Secondary | ICD-10-CM | POA: Insufficient documentation

## 2022-02-06 DIAGNOSIS — K2970 Gastritis, unspecified, without bleeding: Secondary | ICD-10-CM | POA: Insufficient documentation

## 2022-02-06 DIAGNOSIS — G894 Chronic pain syndrome: Secondary | ICD-10-CM | POA: Insufficient documentation

## 2022-02-06 DIAGNOSIS — K279 Peptic ulcer, site unspecified, unspecified as acute or chronic, without hemorrhage or perforation: Secondary | ICD-10-CM | POA: Insufficient documentation

## 2022-02-06 DIAGNOSIS — Z23 Encounter for immunization: Secondary | ICD-10-CM | POA: Insufficient documentation

## 2022-02-06 DIAGNOSIS — N952 Postmenopausal atrophic vaginitis: Secondary | ICD-10-CM | POA: Insufficient documentation

## 2022-02-06 DIAGNOSIS — R1030 Lower abdominal pain, unspecified: Secondary | ICD-10-CM | POA: Insufficient documentation

## 2022-02-06 DIAGNOSIS — K0510 Chronic gingivitis, plaque induced: Secondary | ICD-10-CM | POA: Insufficient documentation

## 2022-02-06 DIAGNOSIS — J441 Chronic obstructive pulmonary disease with (acute) exacerbation: Secondary | ICD-10-CM | POA: Insufficient documentation

## 2022-02-06 DIAGNOSIS — K2270 Barrett's esophagus without dysplasia: Secondary | ICD-10-CM | POA: Insufficient documentation

## 2022-02-06 DIAGNOSIS — G459 Transient cerebral ischemic attack, unspecified: Secondary | ICD-10-CM | POA: Insufficient documentation

## 2022-02-06 DIAGNOSIS — K5730 Diverticulosis of large intestine without perforation or abscess without bleeding: Secondary | ICD-10-CM | POA: Insufficient documentation

## 2022-02-06 DIAGNOSIS — Z1239 Encounter for other screening for malignant neoplasm of breast: Secondary | ICD-10-CM | POA: Insufficient documentation

## 2022-02-06 DIAGNOSIS — K2980 Duodenitis without bleeding: Secondary | ICD-10-CM | POA: Insufficient documentation

## 2022-02-06 DIAGNOSIS — D500 Iron deficiency anemia secondary to blood loss (chronic): Secondary | ICD-10-CM | POA: Insufficient documentation

## 2022-02-06 DIAGNOSIS — Z95828 Presence of other vascular implants and grafts: Secondary | ICD-10-CM | POA: Insufficient documentation

## 2022-02-06 DIAGNOSIS — F329 Major depressive disorder, single episode, unspecified: Secondary | ICD-10-CM | POA: Insufficient documentation

## 2022-02-06 DIAGNOSIS — E782 Mixed hyperlipidemia: Secondary | ICD-10-CM | POA: Insufficient documentation

## 2022-02-06 DIAGNOSIS — J449 Chronic obstructive pulmonary disease, unspecified: Secondary | ICD-10-CM | POA: Insufficient documentation

## 2022-02-06 DIAGNOSIS — G43909 Migraine, unspecified, not intractable, without status migrainosus: Secondary | ICD-10-CM | POA: Insufficient documentation

## 2022-02-06 DIAGNOSIS — K219 Gastro-esophageal reflux disease without esophagitis: Secondary | ICD-10-CM | POA: Insufficient documentation

## 2022-02-08 NOTE — Progress Notes (Signed)
Cardiology Clinic Note   Patient Name: Brenda Carney Date of Encounter: 02/09/2022  Primary Care Provider:  Michael Boston, MD Primary Cardiologist:  Glenetta Hew, MD  Patient Profile    Brenda Carney 71 year old female presents the clinic today for follow-up evaluation of her coronary artery disease.  Past Medical History    Past Medical History:  Diagnosis Date   Arthritis    "thumbs; hips" (05/14/2017)   Cerebral aneurysm    Right ophthalmic artery, left callosal marginal anterior cerebral artery branch   Cerebrovascular disease    Carotid dopplers which showed no significant increase in velocities, extremely minimal on the left, antegrade vertebral bilaterally.   Cervical spondylosis    Chronic lower back pain    COPD (chronic obstructive pulmonary disease) (HCC)    "little bit" (05/14/2017)   Coronary artery disease, non-occlusive    Coronary calcium scoring: June 2018: Score 1106. 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   Pneumonia 2020   Sleep apnea    does not wear CPAP; "think it was medication related" (05/14/2017)   Stroke [facility]) 2010   TIA   Thoracic aortic ectasia (HCC)    ~40 mm on Coronary Calcium Score CT - recommend f/u CTA or MRA   TIA (transient ischemic attack) 09/2011   "several"   Past Surgical History:  Procedure Laterality Date   ABDOMINAL AORTOGRAM W/LOWER EXTREMITY N/A 05/14/2017   Procedure: Abdominal  Aortogram w/Lower Extremity;  Surgeon: Conrad Bishop, MD;  Location: [place] [facility];  Service: Cardiovascular;  Laterality: N/A;   ACROMIO-CLAVICULAR JOINT REPAIR Left 1990s   "I think it was called an [facility] joint removal"   BACK SURGERY     BYPASS GRAFT POPLITEAL TO POPLITEAL Right 05/15/2017   Procedure: BYPASS GRAFT ABOVE KNEE POPLITEAL TO BELOW KNEE POPLITEAL ARTERY;  Surgeon: Conrad Bradford, MD;  Location: [place];  Service: Vascular;  Laterality: Right;   CARDIAC CATHETERIZATION     CORONARY STENT INTERVENTION N/A 09/30/2021   Procedure: CORONARY STENT INTERVENTION;  Surgeon: Leonie Man, MD;  Location: [place] [facility];  Service: Cardiovascular;  Laterality: N/A;   ESOPHAGOGASTRODUODENOSCOPY (EGD) WITH PROPOFOL N/A 04/29/2018   Procedure: ESOPHAGOGASTRODUODENOSCOPY (EGD) WITH PROPOFOL;  Surgeon: Otis Brace, MD;  Location: [place];  Service: Gastroenterology;  Laterality: N/A;   FOOT FRACTURE SURGERY Bilateral    multiple procedures   FRACTURE SURGERY     INTRAVASCULAR ULTRASOUND/IVUS N/A 09/30/2021   Procedure: Intravascular Ultrasound/IVUS;  Surgeon: Leonie Man, MD;  Location: [place] [facility];  Service: Cardiovascular;  Laterality: N/A;   JOINT REPLACEMENT     LEFT HEART CATH AND CORONARY ANGIOGRAPHY N/A 09/30/2021   Procedure: LEFT HEART CATH AND CORONARY ANGIOGRAPHY;  Surgeon: Leonie Man, MD;  Location: [place] [facility];  Service: Cardiovascular;  Laterality: N/A;   LOWER EXTREMITY ANGIOGRAPHY N/A 06/16/2020  Procedure: LOWER EXTREMITY ANGIOGRAPHY;  Surgeon: [name], MD;  Location: [place] [facility];  Service: Cardiovascular;  Laterality: N/A;   MAXIMUM ACCESS (MAS)POSTERIOR LUMBAR INTERBODY FUSION (PLIF) 1 LEVEL N/A 09/30/2014   Procedure: FOR MAXIMUM ACCESS (MAS) POSTERIOR LUMBAR INTERBODY FUSION (PLIF) 1 LEVEL;  Surgeon: Eustace Moore, MD;  Location: [place] [facility];  Service: Neurosurgery;  Laterality: N/A;  FOR MAXIMUM ACCESS (MAS)  POSTERIOR LUMBAR INTERBODY FUSION (PLIF) 1 LEVEL LUMBAR 4-5   MULTIPLE TOOTH EXTRACTIONS Right 04/2017   NM MYOVIEW LTD  06/2017    Normal EF, 66%. No ST changes. Normal study. LOW RISK.   PERIPHERAL VASCULAR ATHERECTOMY Left 06/16/2020   Procedure: PERIPHERAL VASCULAR ATHERECTOMY;  Surgeon: [name], MD;  Location: [place] [facility];  Service: Cardiovascular;  Laterality: Left;  SFA   TOTAL HIP ARTHROPLASTY Right 01/16/2020   Procedure: RIGHT TOTAL HIP ARTHROPLASTY ANTERIOR APPROACH;  Surgeon: Mcarthur Rossetti, MD;  Location: [facility];  Service: Orthopedics;  Laterality: Right;   TOTAL HIP ARTHROPLASTY Left 04/30/2020   Procedure: LEFT TOTAL HIP ARTHROPLASTY ANTERIOR APPROACH;  Surgeon: Mcarthur Rossetti, MD;  Location: [facility];  Service: Orthopedics;  Laterality: Left;   TRANSTHORACIC ECHOCARDIOGRAM  09/07/2013   Done to evaluate TIA: Normal LV size and function. EF 55-60%. GR 1 DD. Mild left atrial dilation. Mildly calcified mitral leaflets. Otherwise normal.    Allergies  Allergies  Allergen Reactions   Penicillins Swelling and Other (See Comments)    TONGUE SWELLING  PATIENT HAD A PCN REACTION WITH IMMEDIATE RASH, FACIAL/TONGUE/THROAT SWELLING, SOB, OR LIGHTHEADEDNESS WITH HYPOTENSION:  #  #  #  YES  #  #  #   Has patient had a PCN reaction causing severe rash involving mucus membranes or skin necrosis: No Has patient had a PCN reaction that required hospitalization: No Has patient had a PCN reaction occurring within the last 10 years: No    Nucynta Er [Tapentadol Hcl Er] Other (See Comments)   Statins     muscle pain   Statins Support [A-G Pro] Other (See Comments)   Lyrica [Pregabalin] Other (See Comments)    DRY MOUTH    History of Present Illness    Brenda Carney has a PMH of elevated coronary calcium score greater than 1000, nonischemic nuclear stress test), peripheral arterial disease (history of right femoropopliteal bypass and right SFA PTA),  hypertension, hyperlipidemia on Repatha, and emphysema.  She is a former heavy smoker and stopped smoking last year.  She presented to the office to see Dr. Ellyn Hack to discuss results of her coronary CTA with positive CT FFR of OM1.  At that time cardiac catheterization was discussed and scheduled for further evaluation.  She underwent cardiac catheterization on 09/30/2021 which showed proximal circumflex 45% with a 30% stenosed first marginal branch, proximal circumflex to mid circumflex lesion 80% stenosed, mid circumflex lesion 50% stenosed.  She received PCI with DES to the lesion.  She was also noted to have proximal RCA lesion 30%, proximal RCA to lesion 70%, proximal-mid RCA lesion 90% with irregular/ulcerated plaque.  Dissection also received PCI with DES.  She was noted to have distal RCA lesion 60%, mild diffuse LAD disease, hyperdynamic left ventricular systolic function, LVEF greater than 65% and normal LVEDP.  She was discharged on amlodipine 10 mg, aspirin, Plavix, lisinopril, Protonix, and Repatha.  She presented to the clinic 10/19/21 for follow-up evaluation stated she felt well.  She reported that she had more energy and  her breathing had improved.  She was planning on increasing her physical activity and wanted to walk her dogs again.  We reviewed her angiography and she expressed understanding.  Her right radial cath site was well-healed with no signs of infection.  I  continued her current medication regimen, gave her the salty 6 diet sheet, asked her to improve physical activity/increase, and planned follow-up in 3 months.   She presents to the clinic today for follow-up evaluation states she feels well.  She reports compliance with all of her medications.  She does note that she has had some dry mouth lately.  We reviewed her medications and it does not appear that any of her cardiac medications are contributing to her dry mouth.  She reports that she completed cardiac rehab and stays  physically active working with her 11 horses and dogs.  She plans to increase her walking.  We reviewed her lipid panel and I will repeat her fasting lipids and LFTs today.  We will continue her heart healthy low-sodium diet, current medication regimen, and have her increase her physical activity as tolerated with a goal of 150 minutes of moderate physical activity per week.  We will plan follow-up with Dr. Ellyn Hack in 6 months.  Today she denies chest pain, shortness of breath, lower extremity edema, fatigue, palpitations, melena, hematuria, hemoptysis, diaphoresis, weakness, presyncope, syncope, orthopnea, and PND.   Home Medications    Prior to Admission medications   Medication Sig Start Date End Date Taking? Authorizing Provider  albuterol (VENTOLIN HFA) 108 (90 Base) MCG/ACT inhaler Inhale 2 puffs into the lungs every 4 (four) hours as needed for wheezing or shortness of breath.    [provider]  amLODipine (NORVASC) 10 MG tablet Take 1 tablet (10 mg total) by mouth daily. 06/18/19   Hall, Carole N, DO  ANORO ELLIPTA 62.5-25 MCG/INH AEPB Inhale 2 puffs into the lungs daily as needed (wheezing).  02/18/19   [provider]  ARIPiprazole (ABILIFY) 2 MG tablet Take 2 mg by mouth daily.    [provider]  aspirin EC 81 MG tablet Take 81 mg by mouth daily. Swallow whole.    [provider]  buPROPion (WELLBUTRIN XL) 150 MG 24 hr tablet Take 150 mg by mouth daily. 04/04/18   [provider]  Calcium Carbonate-Vitamin D (CALCIUM-VITAMIN D3 PO) Take 1 tablet by mouth daily. 1200 mg / 5000 units    [provider]  Carboxymethylcellulose Sodium (THERATEARS) 0.25 % SOLN Place 1 drop into both eyes 4 (four) times daily as needed (dry eye).    [provider]  clopidogrel (PLAVIX) 75 MG tablet Take 1 tablet (75 mg total) by mouth daily. 08/11/21   [name], MD  desvenlafaxine (PRISTIQ) 100 MG 24 hr tablet Take 100 mg by mouth daily.     [provider]  Estradiol 10 MCG TABS vaginal tablet Place 10 mcg vaginally once a week.  05/27/19   [provider]  lisinopril (ZESTRIL) 10 MG tablet Take 10 mg by mouth daily. 03/22/20   [provider]  Multiple Vitamin (MULTIVITAMIN WITH MINERALS) TABS tablet Take 1 tablet by mouth daily. One-A-Day [place]    [provider]  naphazoline-pheniramine (VISINE) 0.025-0.3 % ophthalmic solution Place 1 drop into both eyes 4 (four) times daily as needed (Dry eye).    [provider]  oxyCODONE (OXY IR/ROXICODONE) 5 MG immediate release tablet Take 1-2 tablets (5-10 mg total) by mouth every 6 (six)  hours as needed for moderate pain (pain score 4-6). Patient taking differently: Take 10 mg by mouth in the morning, at noon, in the evening, and at bedtime. 05/04/20   Mcarthur Rossetti, MD  pantoprazole (PROTONIX) 40 MG tablet Take 40 mg by mouth every morning. 07/20/21   [provider]  Propylene Glycol (SYSTANE COMPLETE) 0.6 % SOLN Place 1 drop into both eyes 4 (four) times daily as needed (Dry eye).    [provider]  REPATHA SURECLICK 295 MG/ML SOAJ INJECT CONTENTS OF 1 PEN SUBCUTANEOUSLY EVERY 14 DAYS. 09/05/21   Leonie Man, MD  traZODone (DESYREL) 50 MG tablet Take 50 mg by mouth at bedtime. 02/10/20   [provider]  zolpidem (AMBIEN) 10 MG tablet Take 10 mg by mouth at bedtime.     [provider]    Family History    Family History  Problem Relation Age of Onset   Stroke Father    Heart attack Mother    Stroke Mother    She indicated that her mother is deceased. She indicated that her father is deceased.   Social History    Social History   Socioeconomic History   Marital status: Widowed    Spouse name: [name]    Number of children: 0   Years of education: COLLEGE   Highest education level: Some college, no degree  Occupational History   Occupation: Medical illustrator   Tobacco Use    Smoking status: Some Days    Packs/day: 0.00    Years: 36.00    Pack years: 0.00    Types: Cigarettes   Smokeless tobacco: Never   Tobacco comments:    [name] says she smokes one cigarette every couple of weeks. [name] says she has a lot of friends who still smoke  Vaping Use   Vaping Use: Former  Substance and Sexual Activity   Alcohol use: Yes    Alcohol/week: 3.0 standard drinks    Types: 3 Glasses of wine per week   Drug use: No   Sexual activity: Not Currently  Other Topics Concern   Not on file  Social History Narrative   Patient lives Alone is a Widow Patient has no children.    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    Patient has BA degree.    Smokes 1 cigarette every couple of weeks.   Social Determinants of Health   Financial Resource Strain: Not on file  Food Insecurity: Not on file  Transportation Needs: Not on file  Physical Activity: Not on file  Stress: Not on file  Social Connections: Not on file  Intimate Partner Violence: Not on file     Review of Systems    General:  No chills, fever, night sweats or weight changes.  Cardiovascular:  No chest pain, dyspnea on exertion, edema, orthopnea, palpitations, paroxysmal nocturnal dyspnea. Dermatological: No rash, lesions/masses Respiratory: No cough, dyspnea Urologic: No hematuria, dysuria Abdominal:   No nausea, vomiting, diarrhea, bright red blood per rectum, melena, or hematemesis Neurologic:  No visual changes, wkns, changes in mental status. All other systems reviewed and are otherwise negative except as noted above.  Physical Exam    VS:  BP 122/68    Pulse 82    Ht 5\' 5"  (1.651 m)    Wt 126 lb 3.2 oz (57.2 kg)    BMI 21.00 kg/m  , BMI Body mass index is 21 kg/m. GEN: Well nourished, well developed,  in no acute distress. HEENT: normal. Neck: Supple, no JVD, carotid bruits, or masses. Cardiac: RRR, no murmurs, rubs, or gallops. No clubbing, cyanosis, edema.  Radials/DP/PT 2+ and equal  bilaterally.  Respiratory:  Respirations regular and unlabored, clear to auscultation bilaterally. GI: Soft, nontender, nondistended, BS + x 4. MS: no deformity or atrophy. Skin: warm and dry, no rash. Neuro:  Strength and sensation are intact. Psych: Normal affect.  Accessory Clinical Findings    Recent Labs: 09/08/2021: ALT 15; BUN 7; Creatinine, Ser 0.73; Potassium 3.5; Sodium 138 09/22/2021: Hemoglobin 17.1; Platelets 385   Recent Lipid Panel    Component Value Date/Time   CHOL 153 09/08/2021 1334   TRIG 116 09/08/2021 1334   HDL 79 09/08/2021 1334   CHOLHDL 1.9 09/08/2021 1334   CHOLHDL 5.3 05/15/2017 0307   VLDL 34 05/15/2017 0307   LDLCALC 54 09/08/2021 1334   LDLDIRECT 186 (H) 03/30/2015 1545    ECG personally reviewed by me today-  EKG 10/19/2021 normal sinus rhythm with sinus arrhythmia possible left atrial enlargement septal infarct undetermined age 13 bpm- No acute changes  Echocardiogram 09/06/2013  Study Conclusions   - Procedure narrative: Transthoracic echocardiography.    Technically difficult study with suboptimal image quality.  - Left ventricle: The cavity size was normal. Wall thickness    was normal. Systolic function was normal. The estimated    ejection fraction was in the range of 55% to 60%. Doppler    parameters are consistent with abnormal left ventricular    relaxation (grade 1 diastolic dysfunction). The E/e' ratio    is ~10, suggesting borderline increased LV filling    pressure.  - Mitral valve: Calcified annulus. Mildly thickened leaflets    . Trivial regurgitation.  - Left atrium: The atrium was mildly dilated. Volume 32    ml/m2.  - Atrial septum: Appears thickened. No defect or patent    foramen ovale was identified by color doppler.  - Inferior vena cava: The vessel was normal in size; the    respirophasic diameter changes were in the normal range (=    50%); findings are consistent with normal central venous    pressure.    Cardiac catheterization 09/30/2021   LESION SEGMENT #1: Prox Cx lesion is 45% stenosed with 30% stenosed side branch in 1st Mrg.  Prox Cx to Mid Cx lesion is 80% stenosed. Mid Cx lesion is 50% stenosed.   A drug-eluting stent was successfully placed (based on IVUS guidance) covering all 3 lesions segments, using a STENT ONYX FRONTIER 3.0X34.  Postdilated to 3.3 mm   Post intervention, there is a 0% residual stenosis throughout the entire segment. Post intervention, the small caliber OM1 side branch was reduced to 30% residual stenosis.   ------------------------------------------------------   LESION SEGMENT #2 prox RCA-1 lesion is 30% stenosed.  Prox RCA-2 lesion is 70% stenosed. Prox RCA to Mid RCA lesion is 90% stenosed (irregular/ulcerated plaque).   A drug-eluting stent was successfully placed covering the entire diseased segment, using a STENT ONYX FRONTIER 3.5X30.  Postdilated from 4.1 down to 3.7 mm.   Post intervention, there is a 0% residual stenosis throughout the stented segment.Marland Kitchen   -------------------------------------------------------   Dist RCA lesion is 60% stenosed.   Mild diffuse LAD disease   Mid Cx lesion is 50% stenosed.   Prox RCA to Mid RCA lesion is 90% stenosed.   -------------------------------------------------------   Post intervention, there is a 0% residual stenosis.   Post intervention, the side branch was reduced  to 30% residual stenosis.   There is hyperdynamic left ventricular systolic function.  The left ventricular ejection fraction is greater than 65% by visual estimate.   LV end diastolic pressure is normal.   SUMMARY Severe two-vessel CAD: Consistent with cardiac CTA, mid LCx focal 80% stenosis, but with IVUS there appeared to be more diffuse disease ranging from 40 to 80% with 60 to 75% circumferential mixed calcified and noncalcified plaque burden Successful IVUS guided DES PCI of the proximal to mid LCx using Onyx Frontier DES 3.0 mm x 34 mm  deployed to 3.3 mm.  Reducing the stenosis to 0% with TIMI-3 flow pre and post Not seen on CTA was a proximal to mid ulcerated 70-90% irregular plaque just after the first bend that had appearance of a recently ruptured plaque.  There is downstream 60% stenosis that does not appear flow-limiting. Successful DES PCI of the proximal to mid RCA using Onyx Frontier DES 3.5 mm x 30 mm postdilated in tapered fashion from 4.69mm to 3.7 mm.  Reduced stenosis to 0% with TIMI-3 flow pre and post Normal LAD that is relatively diminutive and does not reach the apex as the PDA is wraparound. Hyperdynamic left ventricular function with normal EF and normal pressures.   RECOMMENDATIONS She is already on aspirin and Plavix.  Should be fine for same-day discharge. Continue PCSK9 inhibitor for lipids.  Statin intolerant. Continue calcium channel blocker in lieu of beta-blocker along with ACE inhibitor. Follow-up with Dr. Ellyn Hack or APP        Glenetta Hew, MD Diagnostic Dominance: Right Intervention    Assessment & Plan   1.  Coronary artery disease-no recent episodes of arm neck back or chest discomfort.  Underwent cardiac catheterization 09/30/2021 which showed distal RCA lesion 60%, mild diffuse LAD disease, mid circumflex lesion 50% stenosed, proximal-mid RCA lesion 90% stenosed.  She received PCI with DES x2 to her circumflex and RCA.  Details above. Continue aspirin, Plavix, Repatha, amlodipine, lisinopril Heart healthy low-sodium diet-salty 6 given Increase physical activity as tolerated-goal 150 minutes of moderate physical activity per week.  Hyperlipidemia-09/08/2021: Cholesterol, Total 153; HDL 79; LDL Chol Calc (NIH) 54; Triglycerides 116 . Continue aspirin, Repatha Heart healthy low-sodium high-fiber diet Increase physical activity as tolerated Repeat fasting lipids and LFTs at next  Essential hypertension-BP today 122/68.  Well-controlled at home. Continue amlodipine, lisinopril Heart  healthy low-sodium diet-salty 6 given Increase physical activity as tolerated  COPD-no increased work of breathing today. Follows with PCP  AKI-creatinine 0.73 on 09/08/2021. Follows with PCP  Tobacco abuse-continues to try to refrain from smoking, reports that she occasionally does smoke.  Again reviewed importance of continued cessation and congratulated.  Disposition: Follow-up with Dr. Ellyn Hack in 6 months.  Jossie Ng. Erinn Huskins NP-C    02/09/2022, 11:58 AM [place] [address] Office [phone] Fax [phone]  Notice: This dictation was prepared with Dragon dictation along with smaller phrase technology. Any transcriptional errors that result from this process are unintentional and may not be corrected upon review.  I spent 14  minutes examining this patient, reviewing medications, and using patient centered shared decision making involving her cardiac care.  Prior to her visit I spent greater than 20 minutes reviewing her past medical history,  medications, and prior cardiac tests.

## 2022-02-09 ENCOUNTER — Encounter: Payer: Self-pay | Admitting: General Practice

## 2022-02-09 ENCOUNTER — Other Ambulatory Visit: Payer: Self-pay

## 2022-02-09 ENCOUNTER — Ambulatory Visit (INDEPENDENT_AMBULATORY_CARE_PROVIDER_SITE_OTHER): Payer: Medicare Other | Admitting: General Practice

## 2022-02-09 VITALS — BP 122/68 | HR 82 | Ht 65.0 in | Wt 126.2 lb

## 2022-02-09 DIAGNOSIS — Z79899 Other long term (current) drug therapy: Secondary | ICD-10-CM | POA: Diagnosis not present

## 2022-02-09 DIAGNOSIS — I10 Essential (primary) hypertension: Secondary | ICD-10-CM | POA: Diagnosis not present

## 2022-02-09 DIAGNOSIS — J449 Chronic obstructive pulmonary disease, unspecified: Secondary | ICD-10-CM | POA: Diagnosis not present

## 2022-02-09 DIAGNOSIS — E785 Hyperlipidemia, unspecified: Secondary | ICD-10-CM

## 2022-02-09 DIAGNOSIS — Z720 Tobacco use: Secondary | ICD-10-CM

## 2022-02-09 DIAGNOSIS — I2510 Atherosclerotic heart disease of native coronary artery without angina pectoris: Secondary | ICD-10-CM | POA: Diagnosis not present

## 2022-02-09 DIAGNOSIS — N179 Acute kidney failure, unspecified: Secondary | ICD-10-CM | POA: Diagnosis not present

## 2022-02-09 NOTE — Patient Instructions (Addendum)
Medication Instructions:  The current medical regimen is effective;  continue present plan and medications as directed. Please refer to the Current Medication list given to you today.   *If you need a refill on your cardiac medications before your next appointment, please call your pharmacy*  Lab Work:   Testing/Procedures:  LIPID AND LFT TODAY NONE  Special Instructions PLEASE READ AND FOLLOW SALTY 6-ATTACHED-1,800mg  daily  PLEASE INCREASE PHYSICAL ACTIVITY AS TOLERATED YOUR GOAL IS 150 MINUTES OF MODERATE PHYSICAL ACTIVITY PER WEEK  Follow-Up: Your next appointment:  6 month(s) In Person with Glenetta Hew, MD  ONLY      Please call our office 2 months in advance to schedule this appointment  :1  At [facility], you and your health needs are our priority.  As part of our continuing mission to provide you with exceptional heart care, we have created designated Provider Care Teams.  These Care Teams include your primary Cardiologist (physician) and Advanced Practice Providers (APPs -  Physician Assistants and Nurse Practitioners) who all work together to provide you with the care you need, when you need it.            6 SALTY THINGS TO AVOID     1,800MG  DAILY

## 2022-02-10 LAB — HEPATIC FUNCTION PANEL
ALT: 13 IU/L (ref 0–32)
AST: 18 IU/L (ref 0–40)
Albumin: 4.1 g/dL (ref 3.8–4.8)
Alkaline Phosphatase: 91 IU/L (ref 44–121)
Bilirubin Total: 0.5 mg/dL (ref 0.0–1.2)
Bilirubin, Direct: 0.18 mg/dL (ref 0.00–0.40)
Total Protein: 6.1 g/dL (ref 6.0–8.5)

## 2022-02-10 LAB — LIPID PANEL
Chol/HDL Ratio: 1.8 ratio (ref 0.0–4.4)
Cholesterol, Total: 132 mg/dL (ref 100–199)
HDL: 72 mg/dL (ref >39)
LDL Chol Calc (NIH): 42 mg/dL (ref 0–99)
Triglycerides: 97 mg/dL (ref 0–149)
VLDL Cholesterol Cal: 18 mg/dL (ref 5–40)

## 2022-02-15 ENCOUNTER — Other Ambulatory Visit: Payer: Self-pay | Admitting: Internal Medicine

## 2022-02-15 DIAGNOSIS — Z1231 Encounter for screening mammogram for malignant neoplasm of breast: Secondary | ICD-10-CM

## 2022-02-16 DIAGNOSIS — F4312 Post-traumatic stress disorder, chronic: Secondary | ICD-10-CM | POA: Diagnosis not present

## 2022-02-16 DIAGNOSIS — F39 Unspecified mood [affective] disorder: Secondary | ICD-10-CM | POA: Diagnosis not present

## 2022-02-16 DIAGNOSIS — F341 Dysthymic disorder: Secondary | ICD-10-CM | POA: Diagnosis not present

## 2022-02-16 DIAGNOSIS — G4709 Other insomnia: Secondary | ICD-10-CM | POA: Diagnosis not present

## 2022-02-21 DIAGNOSIS — E782 Mixed hyperlipidemia: Secondary | ICD-10-CM | POA: Diagnosis not present

## 2022-02-21 DIAGNOSIS — I2510 Atherosclerotic heart disease of native coronary artery without angina pectoris: Secondary | ICD-10-CM | POA: Diagnosis not present

## 2022-02-21 DIAGNOSIS — I10 Essential (primary) hypertension: Secondary | ICD-10-CM | POA: Diagnosis not present

## 2022-02-21 DIAGNOSIS — Z23 Encounter for immunization: Secondary | ICD-10-CM | POA: Diagnosis not present

## 2022-02-21 DIAGNOSIS — J449 Chronic obstructive pulmonary disease, unspecified: Secondary | ICD-10-CM | POA: Diagnosis not present

## 2022-03-01 ENCOUNTER — Ambulatory Visit: Payer: Medicare Other

## 2022-03-01 ENCOUNTER — Ambulatory Visit
Admission: RE | Admit: 2022-03-01 | Discharge: 2022-03-01 | Disposition: A | Discharge status: Home / Self Care | Payer: Medicare Other | Source: Ambulatory Visit | Attending: Internal Medicine | Admitting: Internal Medicine

## 2022-03-01 DIAGNOSIS — Z1231 Encounter for screening mammogram for malignant neoplasm of breast: Secondary | ICD-10-CM

## 2022-03-08 DIAGNOSIS — M47812 Spondylosis without myelopathy or radiculopathy, cervical region: Secondary | ICD-10-CM | POA: Diagnosis not present

## 2022-03-08 DIAGNOSIS — Z79891 Long term (current) use of opiate analgesic: Secondary | ICD-10-CM | POA: Diagnosis not present

## 2022-03-08 DIAGNOSIS — M5412 Radiculopathy, cervical region: Secondary | ICD-10-CM | POA: Diagnosis not present

## 2022-03-08 DIAGNOSIS — G894 Chronic pain syndrome: Secondary | ICD-10-CM | POA: Diagnosis not present

## 2022-03-11 DIAGNOSIS — Z20822 Contact with and (suspected) exposure to covid-19: Secondary | ICD-10-CM | POA: Diagnosis not present

## 2022-03-13 DIAGNOSIS — M810 Age-related osteoporosis without current pathological fracture: Secondary | ICD-10-CM | POA: Diagnosis not present

## 2022-03-22 DIAGNOSIS — M5412 Radiculopathy, cervical region: Secondary | ICD-10-CM | POA: Diagnosis not present

## 2022-04-05 DIAGNOSIS — R059 Cough, unspecified: Secondary | ICD-10-CM | POA: Diagnosis not present

## 2022-04-05 DIAGNOSIS — Z20822 Contact with and (suspected) exposure to covid-19: Secondary | ICD-10-CM | POA: Diagnosis not present

## 2022-04-05 DIAGNOSIS — R051 Acute cough: Secondary | ICD-10-CM | POA: Diagnosis not present

## 2022-04-10 DIAGNOSIS — Z20822 Contact with and (suspected) exposure to covid-19: Secondary | ICD-10-CM | POA: Diagnosis not present

## 2022-04-12 DIAGNOSIS — M5412 Radiculopathy, cervical region: Secondary | ICD-10-CM | POA: Diagnosis not present

## 2022-04-12 DIAGNOSIS — Z79891 Long term (current) use of opiate analgesic: Secondary | ICD-10-CM | POA: Diagnosis not present

## 2022-04-19 DIAGNOSIS — Z20822 Contact with and (suspected) exposure to covid-19: Secondary | ICD-10-CM | POA: Diagnosis not present

## 2022-04-24 DIAGNOSIS — M7989 Other specified soft tissue disorders: Secondary | ICD-10-CM | POA: Diagnosis not present

## 2022-04-24 DIAGNOSIS — M79645 Pain in left finger(s): Secondary | ICD-10-CM | POA: Diagnosis not present

## 2022-04-28 DIAGNOSIS — Z87891 Personal history of nicotine dependence: Secondary | ICD-10-CM | POA: Diagnosis not present

## 2022-04-28 DIAGNOSIS — I10 Essential (primary) hypertension: Secondary | ICD-10-CM | POA: Diagnosis not present

## 2022-04-28 DIAGNOSIS — I77810 Thoracic aortic ectasia: Secondary | ICD-10-CM | POA: Diagnosis not present

## 2022-04-28 DIAGNOSIS — J449 Chronic obstructive pulmonary disease, unspecified: Secondary | ICD-10-CM | POA: Diagnosis not present

## 2022-04-28 DIAGNOSIS — M8589 Other specified disorders of bone density and structure, multiple sites: Secondary | ICD-10-CM | POA: Diagnosis not present

## 2022-04-28 DIAGNOSIS — G729 Myopathy, unspecified: Secondary | ICD-10-CM | POA: Diagnosis not present

## 2022-04-28 DIAGNOSIS — I2510 Atherosclerotic heart disease of native coronary artery without angina pectoris: Secondary | ICD-10-CM | POA: Diagnosis not present

## 2022-04-28 DIAGNOSIS — F325 Major depressive disorder, single episode, in full remission: Secondary | ICD-10-CM | POA: Diagnosis not present

## 2022-04-28 DIAGNOSIS — I719 Aortic aneurysm of unspecified site, without rupture: Secondary | ICD-10-CM | POA: Diagnosis not present

## 2022-04-28 DIAGNOSIS — I700 Atherosclerosis of aorta: Secondary | ICD-10-CM | POA: Diagnosis not present

## 2022-04-28 DIAGNOSIS — E782 Mixed hyperlipidemia: Secondary | ICD-10-CM | POA: Diagnosis not present

## 2022-04-28 DIAGNOSIS — I739 Peripheral vascular disease, unspecified: Secondary | ICD-10-CM | POA: Diagnosis not present

## 2022-04-29 DIAGNOSIS — Z20822 Contact with and (suspected) exposure to covid-19: Secondary | ICD-10-CM | POA: Diagnosis not present

## 2022-05-01 ENCOUNTER — Other Ambulatory Visit: Payer: Self-pay

## 2022-05-01 DIAGNOSIS — L089 Local infection of the skin and subcutaneous tissue, unspecified: Secondary | ICD-10-CM | POA: Diagnosis not present

## 2022-05-01 DIAGNOSIS — M810 Age-related osteoporosis without current pathological fracture: Secondary | ICD-10-CM

## 2022-05-01 DIAGNOSIS — R590 Localized enlarged lymph nodes: Secondary | ICD-10-CM | POA: Diagnosis not present

## 2022-05-01 DIAGNOSIS — Z20822 Contact with and (suspected) exposure to covid-19: Secondary | ICD-10-CM | POA: Diagnosis not present

## 2022-05-01 DIAGNOSIS — W57XXXA Bitten or stung by nonvenomous insect and other nonvenomous arthropods, initial encounter: Secondary | ICD-10-CM | POA: Diagnosis not present

## 2022-05-02 ENCOUNTER — Telehealth: Payer: Self-pay | Admitting: Pharmacy Technician

## 2022-05-02 NOTE — Telephone Encounter (Addendum)
AMGEN BIV: complete  Auth Submission: no auth needed Payer: medicare A/B & BCBS MEDICARE Medication & CPT/J Code(s) submitted: Prolia (Denosumab) [phone] Route of submission (phone, fax, portal): AMGEN PORTAL Auth type: Buy/Bill Units/visits requested: X2 DOSE Reference number Approval from: 05/10/22 to 12/10/22    Medicare and supplement coverage reviewed and auth approval extended to 12/25/23

## 2022-05-09 DIAGNOSIS — H0100B Unspecified blepharitis left eye, upper and lower eyelids: Secondary | ICD-10-CM | POA: Diagnosis not present

## 2022-05-09 DIAGNOSIS — H2513 Age-related nuclear cataract, bilateral: Secondary | ICD-10-CM | POA: Diagnosis not present

## 2022-05-09 DIAGNOSIS — H00014 Hordeolum externum left upper eyelid: Secondary | ICD-10-CM | POA: Diagnosis not present

## 2022-05-10 ENCOUNTER — Encounter: Payer: Self-pay | Admitting: Pulmonary Disease

## 2022-05-10 DIAGNOSIS — M5412 Radiculopathy, cervical region: Secondary | ICD-10-CM | POA: Diagnosis not present

## 2022-05-10 DIAGNOSIS — M542 Cervicalgia: Secondary | ICD-10-CM | POA: Diagnosis not present

## 2022-05-10 DIAGNOSIS — Z79891 Long term (current) use of opiate analgesic: Secondary | ICD-10-CM | POA: Diagnosis not present

## 2022-05-25 ENCOUNTER — Ambulatory Visit (INDEPENDENT_AMBULATORY_CARE_PROVIDER_SITE_OTHER): Payer: Medicare Other

## 2022-05-25 VITALS — BP 105/72 | HR 72 | Temp 97.6°F | Resp 16 | Ht 65.0 in | Wt 133.4 lb

## 2022-05-25 DIAGNOSIS — M810 Age-related osteoporosis without current pathological fracture: Secondary | ICD-10-CM

## 2022-05-25 MED ORDER — DENOSUMAB 60 MG/ML ~~LOC~~ SOSY
60.0000 mg | PREFILLED_SYRINGE | Freq: Once | SUBCUTANEOUS | Status: AC
  Administered 2022-05-25: 60 mg via SUBCUTANEOUS

## 2022-05-25 NOTE — Progress Notes (Signed)
Diagnosis: Osteoporosis  Provider:  Marshell Garfinkel, MD  Procedure: Injection  Prolia (Denosumab), Dose: 60 mg, Site: subcutaneous, Number of injections: 1  Discharge:  Observation period completed.  Condition: Good, Destination: Home . AVS provided to patient.   Performed by:  Adelina Mings, LPN

## 2022-06-05 ENCOUNTER — Encounter: Payer: Self-pay | Admitting: Pulmonary Disease

## 2022-06-07 DIAGNOSIS — M542 Cervicalgia: Secondary | ICD-10-CM | POA: Diagnosis not present

## 2022-06-07 DIAGNOSIS — M5412 Radiculopathy, cervical region: Secondary | ICD-10-CM | POA: Diagnosis not present

## 2022-06-07 DIAGNOSIS — Z79891 Long term (current) use of opiate analgesic: Secondary | ICD-10-CM | POA: Diagnosis not present

## 2022-06-08 ENCOUNTER — Encounter: Payer: Self-pay | Admitting: Pulmonary Disease

## 2022-06-09 ENCOUNTER — Encounter: Payer: Self-pay | Admitting: Pulmonary Disease

## 2022-06-09 ENCOUNTER — Ambulatory Visit (INDEPENDENT_AMBULATORY_CARE_PROVIDER_SITE_OTHER): Payer: Medicare Other

## 2022-06-09 ENCOUNTER — Ambulatory Visit (INDEPENDENT_AMBULATORY_CARE_PROVIDER_SITE_OTHER): Payer: Medicare Other | Admitting: Podiatry

## 2022-06-09 ENCOUNTER — Encounter: Payer: Self-pay | Admitting: Podiatry

## 2022-06-09 DIAGNOSIS — I2510 Atherosclerotic heart disease of native coronary artery without angina pectoris: Secondary | ICD-10-CM

## 2022-06-09 DIAGNOSIS — M779 Enthesopathy, unspecified: Secondary | ICD-10-CM | POA: Diagnosis not present

## 2022-06-09 DIAGNOSIS — M7672 Peroneal tendinitis, left leg: Secondary | ICD-10-CM

## 2022-06-09 MED ORDER — TRIAMCINOLONE ACETONIDE 10 MG/ML IJ SUSP
10.0000 mg | Freq: Once | INTRAMUSCULAR | Status: AC
  Administered 2022-06-09: 10 mg

## 2022-06-09 NOTE — Progress Notes (Signed)
Subjective:   Patient ID: Brenda Carney, female   DOB: 71 y.o.   MRN: [id number]   HPI Patient states she has developed a lot of pain in the outside of her left foot without remembering any kind of an injury and states it is sore to walk on and she does not currently smoke she likes to be active if possible   Review of Systems  All other systems reviewed and are negative.       Objective:  Physical Exam Vitals and nursing note reviewed.  Constitutional:      Appearance: She is well-developed.  Pulmonary:     Effort: Pulmonary effort is normal.  Musculoskeletal:        General: Normal range of motion.  Skin:    General: Skin is warm.  Neurological:     Mental Status: She is alert.     Neurovascular status intact muscle strength found to be adequate range of motion within normal limits.  Patient is noted to have inflammation pain of the lateral side of the left foot at the base of the fifth metatarsal with fluid buildup around the area no muscle strength loss or other pathology     Assessment:  Probability for peroneal tendinitis left inflammation fluid buildup     Plan:  Reviewed condition reviewed x-rays sterile prep injected this area after explaining chances of rupture and patient understands this with a steroid injection consisting of 3 mg dexamethasone Kenalog 5 mg Xylocaine advised on ice therapy anti-inflammatories reappoint to recheck if symptoms persist  X-rays indicate no signs of bony structural issues in this area or structural reactivity

## 2022-06-12 ENCOUNTER — Ambulatory Visit (INDEPENDENT_AMBULATORY_CARE_PROVIDER_SITE_OTHER): Payer: Medicare Other

## 2022-06-12 ENCOUNTER — Ambulatory Visit (INDEPENDENT_AMBULATORY_CARE_PROVIDER_SITE_OTHER): Payer: Medicare Other | Admitting: Orthopaedic Surgery

## 2022-06-12 DIAGNOSIS — Z96642 Presence of left artificial hip joint: Secondary | ICD-10-CM | POA: Diagnosis not present

## 2022-06-12 DIAGNOSIS — I2510 Atherosclerotic heart disease of native coronary artery without angina pectoris: Secondary | ICD-10-CM

## 2022-06-12 DIAGNOSIS — M7062 Trochanteric bursitis, left hip: Secondary | ICD-10-CM

## 2022-06-12 MED ORDER — LIDOCAINE HCL 1 % IJ SOLN
3.0000 mL | INTRAMUSCULAR | Status: AC | PRN
  Administered 2022-06-12: 3 mL

## 2022-06-12 MED ORDER — METHYLPREDNISOLONE ACETATE 40 MG/ML IJ SUSP
40.0000 mg | INTRAMUSCULAR | Status: AC | PRN
  Administered 2022-06-12: 40 mg via INTRA_ARTICULAR

## 2022-06-12 NOTE — Progress Notes (Signed)
+-+  Office Visit Note   Patient: Brenda Carney           Date of Birth: [birth year]           MRN: [id number] Visit Date: 06/12/2022              Requested by: Michael Boston, MD [address] [place],  [place] [address] PCP: Michael Boston, MD   Assessment & Plan: Visit Diagnoses:  1. History of left hip replacement   2. Trochanteric bursitis, left hip     Plan:   Her signs and symptoms are consistent with left trochanteric bursitis.  I did place a steroid injection over the point of maximum tenderness which she tolerated well on that left hip.  I have recommended outpatient physical therapy but she would like to rather see how this injection does for her.  All questions and concerns were answered and addressed.  Follow-up is as needed.  Follow-Up Instructions: Return if symptoms worsen or fail to improve.   Orders:  Orders Placed This Encounter  Procedures   Large Joint Inj   XR HIP UNILAT W OR W/O PELVIS 1V LEFT   No orders of the defined types were placed in this encounter.     Procedures: Large Joint Inj: L greater trochanter on 06/12/2022 10:16 AM Indications: pain and diagnostic evaluation Details: 22 G 1.5 in needle, lateral approach  Arthrogram: No  Medications: 3 mL lidocaine 1 %; 40 mg methylPREDNISolone acetate 40 MG/ML Outcome: tolerated well, no immediate complications Procedure, treatment alternatives, risks and benefits explained, specific risks discussed. Consent was given by the patient. Immediately prior to procedure a time out was called to verify the correct patient, procedure, equipment, support staff and site/side marked as required. Patient was prepped and draped in the usual sterile fashion.       Clinical Data: No additional findings.   Subjective: Chief Complaint  Patient presents  with   Left Hip - Pain  The patient comes in today with left hip pain on the lateral aspect of her left hip.  She has a history of both hips being replaced.  She is a side sleeper and very thin individual.  She cannot take anti-inflammatories since she is on Plavix.  She denies any recent injuries.  She is a very thin individual and very active for 71 years old.  HPI  Review of Systems She denies any fever, chills, nausea, vomiting  Objective: Vital Signs: There were no vitals taken for this visit.  Physical Exam She is alert and orient x3 and walks without a limp and walks without an assistive device. Ortho Exam Examination of both hips shows that moves smoothly and fluidly with no pain in the groin at all.  Her leg lengths are equal.  She has pain to palpation over the left hip trochanteric area consistent with trochanteric bursitis and IT band syndrome. Specialty Comments:  No specialty comments available.  Imaging: XR HIP UNILAT W OR W/O PELVIS 1V LEFT  Result Date:  06/12/2022 An AP pelvis and lateral left hip shows well-seated bilateral total hip arthroplasty with no complicating features.  There is no cortical irregularities around either trochanteric area.    PMFS History: Patient Active Problem List   Diagnosis Date Noted   Osteoporosis 05/01/2022   Atrophic vaginitis 02/06/2022   Diverticular disease of colon 02/06/2022   Duodenitis 02/06/2022   Gastroesophageal reflux disease with esophagitis 02/06/2022   Hx of aorto-femoral bypass 02/06/2022   Iron deficiency anemia due to chronic blood loss 02/06/2022   Lower abdominal pain 02/06/2022   Peptic ulcer disease 02/06/2022   Right hip pain 02/06/2022   Chronic pain syndrome 02/06/2022   Chronic obstructive pulmonary disease with (acute) exacerbation ([place]) 02/06/2022   Chronic obstructive pulmonary disease ([place]) 02/06/2022   Major depression 02/06/2022   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   Gastritis 02/06/2022    Gastro-esophageal reflux 02/06/2022   Gingivitis 02/06/2022   Mixed hyperlipidemia 02/06/2022   Transient ischemic attack 02/06/2022   Peripheral vascular disease ([place]) 02/06/2022   Encounter for immunization 02/06/2022   Screening for breast cancer 02/06/2022   Migraine 02/06/2022   Coronary artery disease    DOE (dyspnea on exertion) 09/02/2021   Thrombocytosis 06/23/2021   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   Avascular necrosis of bone of left hip ([place]) 04/15/2020   Status post total replacement of right hip 01/16/2020   Avascular necrosis of bone of right hip ([place]) 01/06/2020   CAP (community acquired pneumonia) 06/17/2019   Leukocytosis 06/17/2019   Hypoxia    Sepsis ([place]) 04/29/2018   Hematemesis 04/29/2018   Hypokalemia 04/29/2018   AKI (acute kidney injury) ([place]) 04/29/2018   Acute respiratory failure with hypoxia ([place]) 04/29/2018   Thoracic aortic ectasia (HCC)    Sleep apnea    Pneumonia    Migraine headache    Lumbosacral spondylosis    Hx of multiple concussions    History of kidney stones    History of IBS    GERD (gastroesophageal reflux disease)    Exercise-induced asthma with acute exacerbation    Depression    COPD with acute exacerbation (HCC)    Chronic lower back pain    Cervical spondylosis    Cerebral aneurysm    Arthritis    Anemia    Abnormal findings diagnostic imaging of heart and coronary circulation 06/28/2017   Critical lower limb ischemia ([place]) 05/14/2017   Preoperative cardiovascular examination    PAD (peripheral artery disease) (HCC)    S/P lumbar spinal fusion 09/30/2014   Essential hypertension    Hyperlipidemia with target LDL less than 70    Cerebrovascular disease    Headache(784.0) 10/07/2013   TIA (transient ischemic attack) 09/05/2013   Tobacco abuse 09/05/2013   Past Medical History:  Diagnosis Date   Arthritis    "thumbs; hips" (05/14/2017)   Cerebral aneurysm    Right ophthalmic artery, 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   Cervical spondylosis    Chronic lower back pain    COPD (chronic obstructive pulmonary disease) ([place])    "little bit" (05/14/2017)   Coronary artery disease, non-occlusive    Coronary calcium scoring: June 2018: Score 1106. 98 percentile for age -> LOW RISK MYOVIEW   Depression    Exercise-induced asthma with acute exacerbation    "only in very hot weather" (05/14/2017)  GERD (gastroesophageal reflux disease)    History of IBS    resovlved   Hx of multiple concussions    from horse training and riding   Hyperlipidemia with target LDL less than 70    Mostly statin intolerant,. 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   Pneumonia 2020   Sleep apnea    does not wear CPAP; "think it was medication related" (05/14/2017)   Stroke [facility]) 2010   TIA   Thoracic aortic ectasia (HCC)    ~40 mm on Coronary Calcium Score CT - recommend f/u CTA or MRA   TIA (transient ischemic attack) 09/2011   "several"    Family History  Problem Relation Age of Onset   Stroke Father    Heart attack Mother    Stroke Mother     Past Surgical History:  Procedure Laterality Date   ABDOMINAL AORTOGRAM W/LOWER EXTREMITY N/A 05/14/2017   Procedure: Abdominal Aortogram w/Lower Extremity;  Surgeon: Conrad Tylertown, MD;  Location: [facility];  Service: Cardiovascular;  Laterality: N/A;   ACROMIO-CLAVICULAR JOINT REPAIR Left 1990s   "I think it was called an [facility] joint removal"   BACK SURGERY     BYPASS GRAFT POPLITEAL TO POPLITEAL Right 05/15/2017   Procedure: BYPASS GRAFT ABOVE KNEE POPLITEAL TO BELOW KNEE POPLITEAL ARTERY;  Surgeon:  Conrad Troy, MD;  Location: [facility];  Service: Vascular;  Laterality: Right;   CARDIAC CATHETERIZATION     CORONARY STENT INTERVENTION N/A 09/30/2021   Procedure: CORONARY STENT INTERVENTION;  Surgeon: Leonie Man, MD;  Location: [facility];  Service: Cardiovascular;  Laterality: N/A;   ESOPHAGOGASTRODUODENOSCOPY (EGD) WITH PROPOFOL N/A 04/29/2018   Procedure: ESOPHAGOGASTRODUODENOSCOPY (EGD) WITH PROPOFOL;  Surgeon: Otis Brace, MD;  Location: [facility];  Service: Gastroenterology;  Laterality: N/A;   FOOT FRACTURE SURGERY Bilateral    multiple procedures   FRACTURE SURGERY     INTRAVASCULAR ULTRASOUND/IVUS N/A 09/30/2021   Procedure: Intravascular Ultrasound/IVUS;  Surgeon: Leonie Man, MD;  Location: [facility];  Service: Cardiovascular;  Laterality: N/A;   JOINT REPLACEMENT     LEFT HEART CATH AND CORONARY ANGIOGRAPHY N/A 09/30/2021   Procedure: LEFT HEART CATH AND CORONARY ANGIOGRAPHY;  Surgeon: Leonie Man, MD;  Location: [facility];  Service: Cardiovascular;  Laterality: N/A;   LOWER EXTREMITY ANGIOGRAPHY N/A 06/16/2020   Procedure: LOWER EXTREMITY ANGIOGRAPHY;  Surgeon: [name], MD;  Location: [facility];  Service: Cardiovascular;  Laterality: N/A;   MAXIMUM ACCESS (MAS)POSTERIOR LUMBAR INTERBODY FUSION (PLIF) 1 LEVEL N/A 09/30/2014   Procedure: FOR MAXIMUM ACCESS (MAS) POSTERIOR LUMBAR INTERBODY FUSION (PLIF) 1 LEVEL;  Surgeon: Eustace Moore, MD;  Location: [facility];  Service: Neurosurgery;  Laterality: N/A;  FOR MAXIMUM ACCESS (MAS) POSTERIOR LUMBAR INTERBODY FUSION (PLIF) 1 LEVEL LUMBAR 4-5   MULTIPLE TOOTH EXTRACTIONS Right 04/2017   NM MYOVIEW LTD  06/2017    Normal EF, 66%. No ST changes. Normal study. LOW RISK.   PERIPHERAL VASCULAR ATHERECTOMY Left 06/16/2020   Procedure: PERIPHERAL VASCULAR ATHERECTOMY;  Surgeon: [name], MD;  Location: [facility];  Service: Cardiovascular;   Laterality: Left;  SFA   TOTAL HIP ARTHROPLASTY Right 01/16/2020   Procedure: RIGHT TOTAL HIP ARTHROPLASTY ANTERIOR APPROACH;  Surgeon: Ninfa Linden,  Lind Guest, MD;  Location: [facility];  Service: Orthopedics;  Laterality: Right;   TOTAL HIP ARTHROPLASTY Left 04/30/2020   Procedure: LEFT TOTAL HIP ARTHROPLASTY ANTERIOR APPROACH;  Surgeon: Mcarthur Rossetti, MD;  Location: [facility];  Service: Orthopedics;  Laterality: Left;   TRANSTHORACIC ECHOCARDIOGRAM  09/07/2013   Done to evaluate TIA: Normal LV size and function. EF 55-60%. GR 1 DD. Mild left atrial dilation. Mildly calcified mitral leaflets. Otherwise normal.   Social History   Occupational History   Occupation: Medical illustrator   Tobacco Use   Smoking status: Some Days    Packs/day: 0.00    Years: 36.00    Total pack years: 0.00    Types: Cigarettes   Smokeless tobacco: Never   Tobacco comments:    [name] says she smokes one cigarette every couple of weeks. [name] says she has a lot of friends who still smoke  Vaping Use   Vaping Use: Former  Substance and Sexual Activity   Alcohol use: Yes    Alcohol/week: 3.0 standard drinks of alcohol    Types: 3 Glasses of wine per week   Drug use: No   Sexual activity: Not Currently

## 2022-06-20 DIAGNOSIS — F39 Unspecified mood [affective] disorder: Secondary | ICD-10-CM | POA: Diagnosis not present

## 2022-06-20 DIAGNOSIS — F4312 Post-traumatic stress disorder, chronic: Secondary | ICD-10-CM | POA: Diagnosis not present

## 2022-06-20 DIAGNOSIS — G4709 Other insomnia: Secondary | ICD-10-CM | POA: Diagnosis not present

## 2022-06-20 DIAGNOSIS — F341 Dysthymic disorder: Secondary | ICD-10-CM | POA: Diagnosis not present

## 2022-06-21 DIAGNOSIS — M25572 Pain in left ankle and joints of left foot: Secondary | ICD-10-CM | POA: Diagnosis not present

## 2022-06-21 DIAGNOSIS — W5512XA Struck by horse, initial encounter: Secondary | ICD-10-CM | POA: Diagnosis not present

## 2022-06-21 DIAGNOSIS — S9002XA Contusion of left ankle, initial encounter: Secondary | ICD-10-CM | POA: Diagnosis not present

## 2022-06-21 DIAGNOSIS — M25472 Effusion, left ankle: Secondary | ICD-10-CM | POA: Diagnosis not present

## 2022-07-05 DIAGNOSIS — M5412 Radiculopathy, cervical region: Secondary | ICD-10-CM | POA: Diagnosis not present

## 2022-07-05 DIAGNOSIS — M545 Low back pain, unspecified: Secondary | ICD-10-CM | POA: Diagnosis not present

## 2022-07-05 DIAGNOSIS — R102 Pelvic and perineal pain: Secondary | ICD-10-CM | POA: Diagnosis not present

## 2022-07-05 DIAGNOSIS — Z79891 Long term (current) use of opiate analgesic: Secondary | ICD-10-CM | POA: Diagnosis not present

## 2022-07-24 DIAGNOSIS — W57XXXA Bitten or stung by nonvenomous insect and other nonvenomous arthropods, initial encounter: Secondary | ICD-10-CM | POA: Diagnosis not present

## 2022-07-24 DIAGNOSIS — L089 Local infection of the skin and subcutaneous tissue, unspecified: Secondary | ICD-10-CM | POA: Diagnosis not present

## 2022-08-03 DIAGNOSIS — M545 Low back pain, unspecified: Secondary | ICD-10-CM | POA: Diagnosis not present

## 2022-08-03 DIAGNOSIS — M961 Postlaminectomy syndrome, not elsewhere classified: Secondary | ICD-10-CM | POA: Diagnosis not present

## 2022-08-03 DIAGNOSIS — M48062 Spinal stenosis, lumbar region with neurogenic claudication: Secondary | ICD-10-CM | POA: Diagnosis not present

## 2022-08-03 DIAGNOSIS — M5412 Radiculopathy, cervical region: Secondary | ICD-10-CM | POA: Diagnosis not present

## 2022-08-03 DIAGNOSIS — Z79891 Long term (current) use of opiate analgesic: Secondary | ICD-10-CM | POA: Diagnosis not present

## 2022-08-10 ENCOUNTER — Other Ambulatory Visit: Payer: Self-pay | Admitting: Cardiology

## 2022-08-11 ENCOUNTER — Other Ambulatory Visit: Payer: Self-pay | Admitting: Cardiology

## 2022-08-14 ENCOUNTER — Other Ambulatory Visit: Payer: Self-pay | Admitting: Cardiology

## 2022-08-15 ENCOUNTER — Other Ambulatory Visit: Payer: Self-pay | Admitting: Cardiology

## 2022-08-15 ENCOUNTER — Telehealth: Payer: Self-pay | Admitting: Cardiology

## 2022-08-15 ENCOUNTER — Other Ambulatory Visit: Payer: Self-pay | Admitting: *Deleted

## 2022-08-15 DIAGNOSIS — I739 Peripheral vascular disease, unspecified: Secondary | ICD-10-CM

## 2022-08-15 NOTE — Telephone Encounter (Signed)
*  STAT* If patient is at the pharmacy, call can be transferred to refill team.   1. Which medications need to be refilled? (please list name of each medication and dose if known) bisoprolol (ZEBETA) 5 MG tablet - Take 1 tablet (5 mg total) by mouth daily.   2. Which pharmacy/location (including street and city if local pharmacy) is medication to be sent to?[facility], [address]  3. Do they need a 30 day or 90 day supply? 90   Pt requesting a refill. Made a 52mo f/u 12/04/22 with Dr. HEllyn Hack

## 2022-08-16 ENCOUNTER — Ambulatory Visit (HOSPITAL_COMMUNITY)
Admission: RE | Admit: 2022-08-16 | Discharge: 2022-08-16 | Disposition: A | Discharge status: Home / Self Care | Payer: Medicare Other | Source: Ambulatory Visit | Attending: Vascular Surgery | Admitting: Vascular Surgery

## 2022-08-16 ENCOUNTER — Other Ambulatory Visit: Payer: Self-pay | Admitting: Cardiology

## 2022-08-16 ENCOUNTER — Ambulatory Visit (INDEPENDENT_AMBULATORY_CARE_PROVIDER_SITE_OTHER)
Admission: RE | Admit: 2022-08-16 | Discharge: 2022-08-16 | Disposition: A | Discharge status: Home / Self Care | Payer: Medicare Other | Source: Ambulatory Visit | Attending: Vascular Surgery | Admitting: Vascular Surgery

## 2022-08-16 DIAGNOSIS — I739 Peripheral vascular disease, unspecified: Secondary | ICD-10-CM

## 2022-08-16 MED ORDER — BISOPROLOL FUMARATE 5 MG PO TABS: 5.0000 mg | ORAL_TABLET | Freq: Every day | ORAL | 3 refills | Status: DC

## 2022-08-17 ENCOUNTER — Other Ambulatory Visit: Payer: Self-pay

## 2022-08-17 ENCOUNTER — Ambulatory Visit (INDEPENDENT_AMBULATORY_CARE_PROVIDER_SITE_OTHER): Payer: Medicare Other | Admitting: Physician Assistant

## 2022-08-17 ENCOUNTER — Encounter: Payer: Self-pay | Admitting: Pulmonary Disease

## 2022-08-17 VITALS — BP 111/74 | HR 73 | Temp 97.9°F | Resp 20 | Ht 65.0 in | Wt 130.2 lb

## 2022-08-17 DIAGNOSIS — I2510 Atherosclerotic heart disease of native coronary artery without angina pectoris: Secondary | ICD-10-CM

## 2022-08-17 DIAGNOSIS — I739 Peripheral vascular disease, unspecified: Secondary | ICD-10-CM | POA: Diagnosis not present

## 2022-08-17 DIAGNOSIS — M5416 Radiculopathy, lumbar region: Secondary | ICD-10-CM | POA: Diagnosis not present

## 2022-08-17 NOTE — Progress Notes (Addendum)
Office Note     CC:  follow up Requesting Provider:  Michael Boston, MD  HPI: Brenda Carney is a 71 y.o. (10-10-1951) female who presents for surveillance of PAD.  Surgical history significant for right above-the-knee to below the knee popliteal bypass with vein by Dr. Vallarie Mare in 2018.  More recently she had right SFA laser atherectomy with balloon angioplasty including drug-coated balloon on 06/16/2020 due to recurrent claudication.  She reports ongoing claudication of the right leg as well as her left leg.  She denies any rest pain or tissue loss.  Only recently, left lower extremity claudication is feeling similar to her right leg symptoms.  She is on her aspirin and Plavix daily.  She has an occasional 1 to 2 cigarettes over the course of a week.   Past Medical History:  Diagnosis Date   Arthritis    "thumbs; hips" (05/14/2017)   Cerebral aneurysm    Right ophthalmic artery, left callosal marginal anterior cerebral artery branch   Cerebrovascular disease    Carotid dopplers which showed no significant increase in velocities, extremely minimal on the left, antegrade vertebral bilaterally.   Cervical spondylosis    Chronic lower back pain    COPD (chronic obstructive pulmonary disease) (HCC)    "little bit" (05/14/2017)   Coronary artery disease, non-occlusive    Coronary calcium scoring: June 2018: Score 1106. 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    Pneumonia 2020   Sleep apnea    does not wear CPAP; "think it was medication related" (05/14/2017)   Stroke [facility] 2010   TIA   Thoracic aortic ectasia (HCC)    ~40 mm on Coronary Calcium Score CT - recommend f/u CTA or MRA   TIA (transient ischemic attack) 09/2011   "several"    Past Surgical History:  Procedure Laterality Date   ABDOMINAL AORTOGRAM W/LOWER EXTREMITY N/A 05/14/2017   Procedure: Abdominal Aortogram w/Lower Extremity;  Surgeon: Conrad [place], MD;  Location: [place] [facility];  Service: Cardiovascular;  Laterality: N/A;   ACROMIO-CLAVICULAR JOINT REPAIR Left 1990s   "I think it was called an [facility] joint removal"   BACK SURGERY     BYPASS GRAFT POPLITEAL TO POPLITEAL Right 05/15/2017   Procedure: BYPASS GRAFT ABOVE KNEE POPLITEAL TO BELOW KNEE POPLITEAL ARTERY;  Surgeon: Conrad Agu[place], MD;  Location: [place];  Service: Vascular;  Laterality: Right;   CARDIAC CATHETERIZATION     CORONARY STENT INTERVENTION N/A 09/30/2021   Procedure: CORONARY STENT INTERVENTION;  Surgeon: Leonie Man, MD;  Location: [place] [facility];  Service: Cardiovascular;  Laterality: N/A;   ESOPHAGOGASTRODUODENOSCOPY (EGD) WITH PROPOFOL N/A 04/29/2018   Procedure: ESOPHAGOGASTRODUODENOSCOPY (EGD) WITH PROPOFOL;  Surgeon: Otis Brace, MD;  Location: [place];  Service: Gastroenterology;  Laterality: N/A;   FOOT FRACTURE SURGERY Bilateral    multiple procedures   FRACTURE SURGERY  INTRAVASCULAR ULTRASOUND/IVUS N/A 09/30/2021   Procedure: Intravascular Ultrasound/IVUS;  Surgeon: Leonie Man, MD;  Location: [place] [facility];  Service: Cardiovascular;  Laterality: N/A;   JOINT REPLACEMENT     LEFT HEART CATH AND CORONARY ANGIOGRAPHY N/A 09/30/2021   Procedure: LEFT HEART CATH AND CORONARY ANGIOGRAPHY;  Surgeon: Leonie Man, MD;  Location: [place] [facility];  Service: Cardiovascular;  Laterality: N/A;   LOWER EXTREMITY ANGIOGRAPHY N/A 06/16/2020   Procedure: LOWER EXTREMITY  ANGIOGRAPHY;  Surgeon: [name], MD;  Location: [place] [facility];  Service: Cardiovascular;  Laterality: N/A;   MAXIMUM ACCESS (MAS)POSTERIOR LUMBAR INTERBODY FUSION (PLIF) 1 LEVEL N/A 09/30/2014   Procedure: FOR MAXIMUM ACCESS (MAS) POSTERIOR LUMBAR INTERBODY FUSION (PLIF) 1 LEVEL;  Surgeon: Eustace Moore, MD;  Location: [place] [facility];  Service: Neurosurgery;  Laterality: N/A;  FOR MAXIMUM ACCESS (MAS) POSTERIOR LUMBAR INTERBODY FUSION (PLIF) 1 LEVEL LUMBAR 4-5   MULTIPLE TOOTH EXTRACTIONS Right 04/2017   NM MYOVIEW LTD  06/2017    Normal EF, 66%. No ST changes. Normal study. LOW RISK.   PERIPHERAL VASCULAR ATHERECTOMY Left 06/16/2020   Procedure: PERIPHERAL VASCULAR ATHERECTOMY;  Surgeon: [name], MD;  Location: [place] [facility];  Service: Cardiovascular;  Laterality: Left;  SFA   TOTAL HIP ARTHROPLASTY Right 01/16/2020   Procedure: RIGHT TOTAL HIP ARTHROPLASTY ANTERIOR APPROACH;  Surgeon: Mcarthur Rossetti, MD;  Location: [facility];  Service: Orthopedics;  Laterality: Right;   TOTAL HIP ARTHROPLASTY Left 04/30/2020   Procedure: LEFT TOTAL HIP ARTHROPLASTY ANTERIOR APPROACH;  Surgeon: Mcarthur Rossetti, MD;  Location: [facility];  Service: Orthopedics;  Laterality: Left;   TRANSTHORACIC ECHOCARDIOGRAM  09/07/2013   Done to evaluate TIA: Normal LV size and function. EF 55-60%. GR 1 DD. Mild left atrial dilation. Mildly calcified mitral leaflets. Otherwise normal.    Social History   Socioeconomic History   Marital status: Widowed    Spouse name: Brenda Carney    Number of children: 0   Years of education: COLLEGE   Highest education level: Some college, no degree  Occupational History   Occupation: Medical illustrator   Tobacco Use   Smoking status: Some Days    Packs/day: 0.00    Years: 36.00    Total pack years: 0.00    Types: Cigarettes    Passive exposure: Never   Smokeless tobacco: Never   Tobacco comments:    [name] says she smokes one cigarette every  couple of weeks. [name] says she has a lot of friends who still smoke  Vaping Use   Vaping Use: Former  Substance and Sexual Activity   Alcohol use: Yes    Alcohol/week: 3.0 standard drinks of alcohol    Types: 3 Glasses of wine per week   Drug use: No   Sexual activity: Not Currently  Other Topics Concern   Not on file  Social History Narrative   Patient lives Alone is a Widow Patient has no children.    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    Patient has BA degree.    Smokes 1 cigarette every couple of weeks.   Social Determinants of Health   Financial Resource Strain: Not on file  Food Insecurity: Not on file  Transportation Needs: Not on file  Physical Activity: Not on file  Stress: Not on file  Social Connections: Not on file  Intimate Partner Violence: Not on file    Family History  Problem  Relation Age of Onset   Stroke Father    Heart attack Mother    Stroke Mother     Current Outpatient Medications  Medication Sig Dispense Refill   albuterol (PROVENTIL) (2.5 MG/3ML) 0.083% nebulizer solution 3 ml as needed for wheezing or shortness of breath     albuterol (VENTOLIN HFA) 108 (90 Base) MCG/ACT inhaler 2 puffs as needed     amLODipine (NORVASC) 10 MG tablet 1 tablet     ANORO ELLIPTA 62.5-25 MCG/INH AEPB Inhale 2 puffs into the lungs daily as needed (wheezing).      Apoaequorin (PREVAGEN) 10 MG CAPS Take 10 mg by mouth daily.     ARIPiprazole (ABILIFY) 2 MG tablet 1 tablet     Ascorbic Acid (VITAMIN C) 1000 MG tablet See admin instructions.     aspirin 81 MG chewable tablet 1 tablet     BINAXNOW COVID-19 AG HOME TEST KIT See admin instructions.     bisoprolol (ZEBETA) 5 MG tablet Take 1 tablet (5 mg total) by mouth daily. 90 tablet 3   buPROPion (WELLBUTRIN SR) 150 MG 12 hr tablet 1 tablet in the morning     Calcium Carbonate-Vitamin D (CALTRATE 600+D PO)      Carboxymethylcellulose Sodium (THERATEARS) 0.25 % SOLN Place 1 drop into both eyes 4  (four) times daily as needed (dry eye).     clopidogrel (PLAVIX) 75 MG tablet Take 1 tablet by mouth daily. 90 tablet 2   desvenlafaxine (PRISTIQ) 100 MG 24 hr tablet 1 tablet     Estradiol 10 MCG TABS vaginal tablet Place 10 mcg vaginally once a week.      Evolocumab (REPATHA) 140 MG/ML SOSY Inject 1 ml     fluticasone (FLONASE) 50 MCG/ACT nasal spray 1 spray in each nostril     LAGEVRIO 200 MG CAPS capsule SMARTSIG:4 Capsule(s) By Mouth Every 12 Hours     lisinopril (ZESTRIL) 10 MG tablet Take 10 mg by mouth daily.     Multiple Vitamins-Minerals (CENTRUM SILVER 50+WOMEN) TABS Take by mouth.     naphazoline-pheniramine (VISINE) 0.025-0.3 % ophthalmic solution Place 1 drop into both eyes 4 (four) times daily as needed (Dry eye).     oxyCODONE (OXY IR/ROXICODONE) 5 MG immediate release tablet Take 10 mg by mouth 4 (four) times daily.     Oxycodone HCl 10 MG TABS Take 10 mg by mouth in the morning, at noon, in the evening, and at bedtime.     prazosin (MINIPRESS) 1 MG capsule 1 capsule at bedtime     Propylene Glycol (SYSTANE COMPLETE) 0.6 % SOLN Place 1 drop into both eyes 4 (four) times daily as needed (Dry eye).     Propylene Glycol-Glycerin (SOOTHE) 0.6-0.6 % SOLN 1 drop into affected eye as needed     Propylhexedrine (BENZEDREX) INHA Place 1 each into the nose daily.     tiotropium (SPIRIVA HANDIHALER) 18 MCG inhalation capsule 1 capsule by inhaling the contents of the capsule using the HandiHaler device     tobramycin-dexamethasone (TOBRADEX) ophthalmic solution SMARTSIG:In Eye(s)     traZODone (DESYREL) 50 MG tablet 1 tablet at bedtime as needed     zolpidem (AMBIEN) 10 MG tablet Take 10 mg by mouth at bedtime.  (Patient not taking: Reported on 08/17/2022)     zolpidem (AMBIEN) 5 MG tablet 1 tablet at bedtime. (Patient not taking: Reported on 08/17/2022)     No current facility-administered medications for this visit.    Allergies  Allergen Reactions  Penicillins Swelling and Other  (See Comments)    TONGUE SWELLING  PATIENT HAD A PCN REACTION WITH IMMEDIATE RASH, FACIAL/TONGUE/THROAT SWELLING, SOB, OR LIGHTHEADEDNESS WITH HYPOTENSION:  #  #  #  YES  #  #  #   Has patient had a PCN reaction causing severe rash involving mucus membranes or skin necrosis: No Has patient had a PCN reaction that required hospitalization: No Has patient had a PCN reaction occurring within the last 10 years: No    Nucynta Er [Tapentadol Hcl Er] Other (See Comments)   Statins     muscle pain   Statins Support [A-G Pro] Other (See Comments)   Lyrica [Pregabalin] Other (See Comments)    DRY MOUTH     REVIEW OF SYSTEMS:   '[X]'  denotes positive finding, '[ ]'  denotes negative finding Cardiac  Comments:  Chest pain or chest pressure:    Shortness of breath upon exertion:    Short of breath when lying flat:    Irregular heart rhythm:        Vascular    Pain in calf, thigh, or hip brought on by ambulation:    Pain in feet at night that wakes you up from your sleep:     Blood clot in your veins:    Leg swelling:         Pulmonary    Oxygen at home:    Productive cough:     Wheezing:         Neurologic    Sudden weakness in arms or legs:     Sudden numbness in arms or legs:     Sudden onset of difficulty speaking or slurred speech:    Temporary loss of vision in one eye:     Problems with dizziness:         Gastrointestinal    Blood in stool:     Vomited blood:         Genitourinary    Burning when urinating:     Blood in urine:        Psychiatric    Major depression:         Hematologic    Bleeding problems:    Problems with blood clotting too easily:        Skin    Rashes or ulcers:        Constitutional    Fever or chills:      PHYSICAL EXAMINATION:  Vitals:   08/17/22 1056  BP: 111/74  Pulse: 73  Resp: 20  Temp: 97.9 F (36.6 C)  TempSrc: Temporal  SpO2: 96%  Weight: 130 lb 3.2 oz (59.1 kg)  Height: '5\' 5"'  (1.651 m)    General:  WDWN in NAD; vital  signs documented above Gait: Not observed HENT: WNL, normocephalic Pulmonary: normal non-labored breathing , without Rales, rhonchi,  wheezing Cardiac: regular HR Abdomen: soft, NT, no masses Skin: without rashes Vascular Exam/Pulses:  Right Left  DP absent absent  PT 1+ (weak) absent   Extremities: without ischemic changes, without Gangrene , without cellulitis; without open wounds;  Musculoskeletal: no muscle wasting or atrophy  Neurologic: A&O X 3;  No focal weakness or paresthesias are detected Psychiatric:  The pt has Normal affect.   Non-Invasive Vascular Imaging:   Right lower extremity arterial duplex demonstrates a widely patent above to below the knee popliteal bypass graft with velocities in the 20s.  Velocities are similar to previous studies.   ABI/TBIToday's ABIToday's TBIPrevious ABIPrevious TBI  +-------+-----------+-----------+------------+------------+  Right  0.87       0.54       1.06        0.54          +-------+-----------+-----------+------------+------------+  Left   0.77       0.36       1.04        0.49          +-------+-----------+-----------+------------+------------+    ASSESSMENT/PLAN:: 71 y.o. female here for follow up for surveillance of PAD   -Subjectively claudication symptoms have worsened in the right lower extremity.  She has also developed claudication symptoms in the left lower extremity which she did not have previously.  She is without rest pain or tissue loss of bilateral lower extremities.  Arterial duplex demonstrates a patent bypass graft however does have velocities in the 20s centimeters per second.  Velocities are consistent with prior studies however she also had a drop in ABI from 1.06-0.87.  She maintains bilateral femoral pulses.  She continues to take her aspirin and Plavix.  We discussed angiography given worsening symptoms.  I will discuss symptoms and imaging study results with Dr. Carlis Abbott however we will  tentatively schedule her for angiography on 08/31/2022.  I will notify the patient if plans change.  ADDENDUM 08/18/22 Case was discussed with Dr. Carlis Abbott.  Given arterial duplex findings, drop in ABI, and worsening claudication symptoms we will proceed with angiography as planned on 08/31/22.  Dagoberto Ligas, PA-C [facility] [phone]  Clinic MD:   Scot Dock

## 2022-08-31 ENCOUNTER — Other Ambulatory Visit: Payer: Self-pay

## 2022-08-31 ENCOUNTER — Encounter (HOSPITAL_COMMUNITY)
Admission: RE | Disposition: A | Discharge status: Home / Self Care | Payer: Self-pay | Source: Home / Self Care | Attending: Vascular Surgery

## 2022-08-31 ENCOUNTER — Ambulatory Visit (HOSPITAL_COMMUNITY)
Admission: RE | Admit: 2022-08-31 | Discharge: 2022-08-31 | Disposition: A | Discharge status: Home / Self Care | Payer: Medicare Other | Attending: Vascular Surgery | Admitting: Vascular Surgery

## 2022-08-31 DIAGNOSIS — I739 Peripheral vascular disease, unspecified: Secondary | ICD-10-CM | POA: Diagnosis not present

## 2022-08-31 DIAGNOSIS — F17210 Nicotine dependence, cigarettes, uncomplicated: Secondary | ICD-10-CM | POA: Diagnosis not present

## 2022-08-31 DIAGNOSIS — T82898A Other specified complication of vascular prosthetic devices, implants and grafts, initial encounter: Secondary | ICD-10-CM | POA: Diagnosis not present

## 2022-08-31 DIAGNOSIS — I70211 Atherosclerosis of native arteries of extremities with intermittent claudication, right leg: Secondary | ICD-10-CM | POA: Diagnosis not present

## 2022-08-31 DIAGNOSIS — Z7982 Long term (current) use of aspirin: Secondary | ICD-10-CM | POA: Insufficient documentation

## 2022-08-31 DIAGNOSIS — Z7902 Long term (current) use of antithrombotics/antiplatelets: Secondary | ICD-10-CM | POA: Insufficient documentation

## 2022-08-31 HISTORY — PX: ABDOMINAL AORTOGRAM W/LOWER EXTREMITY: CATH118223

## 2022-08-31 HISTORY — PX: PERIPHERAL VASCULAR INTERVENTION: CATH118257

## 2022-08-31 LAB — POCT I-STAT, CHEM 8
BUN: 11 mg/dL (ref 8–23)
Calcium, Ion: 1.23 mmol/L (ref 1.15–1.40)
Chloride: 102 mmol/L (ref 98–111)
Creatinine, Ser: 0.6 mg/dL (ref 0.44–1.00)
Glucose, Bld: 88 mg/dL (ref 70–99)
HCT: 46 % (ref 36.0–46.0)
Hemoglobin: 15.6 g/dL — ABNORMAL HIGH (ref 12.0–15.0)
Potassium: 3.9 mmol/L (ref 3.5–5.1)
Sodium: 139 mmol/L (ref 135–145)
TCO2: 26 mmol/L (ref 22–32)

## 2022-08-31 LAB — POCT ACTIVATED CLOTTING TIME
Activated Clotting Time: 203 seconds
Activated Clotting Time: 251 seconds
Activated Clotting Time: 203 seconds
Activated Clotting Time: 161 seconds

## 2022-08-31 SURGERY — ABDOMINAL AORTOGRAM W/LOWER EXTREMITY
Anesthesia: LOCAL | Laterality: Right

## 2022-08-31 MED ORDER — FENTANYL CITRATE (PF) 100 MCG/2ML IJ SOLN
INTRAMUSCULAR | Status: AC
  Filled 2022-08-31: qty 2

## 2022-08-31 MED ORDER — SODIUM CHLORIDE 0.9 % IV SOLN: INTRAVENOUS | Status: AC

## 2022-08-31 MED ORDER — SODIUM CHLORIDE 0.9 % IV SOLN: INTRAVENOUS | Status: DC

## 2022-08-31 MED ORDER — SODIUM CHLORIDE 0.9% FLUSH: 3.0000 mL | Freq: Two times a day (BID) | INTRAVENOUS | Status: DC

## 2022-08-31 MED ORDER — SODIUM CHLORIDE 0.9% FLUSH: 3.0000 mL | INTRAVENOUS | Status: DC | PRN

## 2022-08-31 MED ORDER — ONDANSETRON HCL 4 MG/2ML IJ SOLN: 4.0000 mg | Freq: Four times a day (QID) | INTRAMUSCULAR | Status: DC | PRN

## 2022-08-31 MED ORDER — FENTANYL CITRATE (PF) 100 MCG/2ML IJ SOLN
25.0000 ug | Freq: Once | INTRAMUSCULAR | Status: AC
  Administered 2022-08-31: 25 ug via INTRAVENOUS

## 2022-08-31 MED ORDER — SODIUM CHLORIDE 0.9 % IV SOLN: 250.0000 mL | INTRAVENOUS | Status: DC | PRN

## 2022-08-31 MED ORDER — ACETAMINOPHEN 325 MG PO TABS: 650.0000 mg | ORAL_TABLET | ORAL | Status: DC | PRN

## 2022-08-31 MED ORDER — MIDAZOLAM HCL 2 MG/2ML IJ SOLN
INTRAMUSCULAR | Status: AC
  Filled 2022-08-31: qty 2

## 2022-08-31 MED ORDER — LIDOCAINE HCL (PF) 1 % IJ SOLN
INTRAMUSCULAR | Status: AC
Start: 2022-08-31 — End: ?
  Filled 2022-08-31: qty 30

## 2022-08-31 MED ORDER — LABETALOL HCL 5 MG/ML IV SOLN
INTRAVENOUS | Status: AC
  Filled 2022-08-31: qty 4

## 2022-08-31 MED ORDER — IODIXANOL 320 MG/ML IV SOLN
INTRAVENOUS | Status: DC | PRN
  Administered 2022-08-31: 175 mL via INTRA_ARTERIAL

## 2022-08-31 MED ORDER — FENTANYL CITRATE (PF) 100 MCG/2ML IJ SOLN
INTRAMUSCULAR | Status: DC | PRN
  Administered 2022-08-31 (×2): 50 ug via INTRAVENOUS

## 2022-08-31 MED ORDER — HEPARIN SODIUM (PORCINE) 1000 UNIT/ML IJ SOLN
INTRAMUSCULAR | Status: DC | PRN
  Administered 2022-08-31: 6000 [IU] via INTRAVENOUS
  Administered 2022-08-31: 2500 [IU] via INTRAVENOUS

## 2022-08-31 MED ORDER — HEPARIN (PORCINE) IN NACL 1000-0.9 UT/500ML-% IV SOLN
INTRAVENOUS | Status: AC
  Filled 2022-08-31: qty 1000

## 2022-08-31 MED ORDER — LABETALOL HCL 5 MG/ML IV SOLN
10.0000 mg | INTRAVENOUS | Status: DC | PRN
  Administered 2022-08-31: 10 mg via INTRAVENOUS
  Filled 2022-08-31: qty 4

## 2022-08-31 MED ORDER — HEPARIN SODIUM (PORCINE) 1000 UNIT/ML IJ SOLN
INTRAMUSCULAR | Status: AC
  Filled 2022-08-31: qty 10

## 2022-08-31 MED ORDER — LIDOCAINE HCL (PF) 1 % IJ SOLN
INTRAMUSCULAR | Status: DC | PRN
  Administered 2022-08-31: 30 mL

## 2022-08-31 MED ORDER — HEPARIN (PORCINE) IN NACL 1000-0.9 UT/500ML-% IV SOLN
INTRAVENOUS | Status: DC | PRN
  Administered 2022-08-31 (×2): 500 mL

## 2022-08-31 MED ORDER — MIDAZOLAM HCL 2 MG/2ML IJ SOLN
INTRAMUSCULAR | Status: DC | PRN
  Administered 2022-08-31 (×2): 1 mg via INTRAVENOUS

## 2022-08-31 MED ORDER — HYDRALAZINE HCL 20 MG/ML IJ SOLN: 5.0000 mg | INTRAMUSCULAR | Status: DC | PRN

## 2022-08-31 SURGICAL SUPPLY — 20 items
BALLN MUSTANG 6X150X135 (BALLOONS) ×2
BALLOON MUSTANG 6X150X135 (BALLOONS) IMPLANT
CATH CROSS OVER TEMPO 5F (CATHETERS) IMPLANT
CATH OMNI FLUSH 5F 65CM (CATHETERS) IMPLANT
CATH QUICKCROSS SUPP .035X90CM (MICROCATHETER) IMPLANT
GLIDEWIRE ADV .035X260CM (WIRE) IMPLANT
GUIDEWIRE ANGLED .035X150CM (WIRE) IMPLANT
KIT ENCORE 26 ADVANTAGE (KITS) IMPLANT
KIT MICROPUNCTURE NIT STIFF (SHEATH) IMPLANT
KIT PV (KITS) ×2 IMPLANT
SHEATH FLEX ANSEL ANG 6F 45CM (SHEATH) IMPLANT
SHEATH PINNACLE 5F 10CM (SHEATH) IMPLANT
SHEATH PROBE COVER 6X72 (BAG) IMPLANT
STENT ELUVIA 7X100X130 (Permanent Stent) IMPLANT
STENT ELUVIA 7X150X130 (Permanent Stent) IMPLANT
SYR MEDRAD MARK 7 150ML (SYRINGE) ×2 IMPLANT
TRANSDUCER W/STOPCOCK (MISCELLANEOUS) ×2 IMPLANT
TRAY PV CATH (CUSTOM PROCEDURE TRAY) ×2 IMPLANT
WIRE BENTSON .035X145CM (WIRE) IMPLANT
WIRE TORQFLEX AUST .018X40CM (WIRE) IMPLANT

## 2022-08-31 NOTE — H&P (Signed)
History and Physical Interval Note:  08/31/2022 8:30 AM  Brenda Carney  has presented today for surgery, with the diagnosis of claudication.  The various methods of treatment have been discussed with the patient and family. After consideration of risks, benefits and other options for treatment, the patient has consented to  Procedure(s): ABDOMINAL AORTOGRAM W/LOWER EXTREMITY (N/A) as a surgical intervention.  The patient's history has been reviewed, patient examined, no change in status, stable for surgery.  I have reviewed the patient's chart and labs.  Questions were answered to the patient's satisfaction.     [name]   Office Note        CC:  follow up Requesting Provider:  Michael Boston, MD   HPI: Brenda Carney is a 71 y.o. (Jun 24, 1951) female who presents for surveillance of PAD.  Surgical history significant for right above-the-knee to below the knee popliteal bypass with vein by Dr. Vallarie Mare in 2018.  More recently she had right SFA laser atherectomy with balloon angioplasty including drug-coated balloon on 06/16/2020 due to recurrent claudication.  She reports ongoing claudication of the right leg as well as her left leg.  She denies any rest pain or tissue loss.  Only recently, left lower extremity claudication is feeling similar to her right leg symptoms.  She is on her aspirin and Plavix daily.  She has an occasional 1 to 2 cigarettes over the course of a week.         Past Medical History:  Diagnosis Date   Arthritis      "thumbs; hips" (05/14/2017)   Cerebral aneurysm      Right ophthalmic artery, left callosal marginal anterior cerebral artery branch   Cerebrovascular disease      Carotid dopplers which showed no significant increase in velocities, extremely minimal on the left, antegrade vertebral bilaterally.   Cervical spondylosis     Chronic lower back pain     COPD (chronic obstructive pulmonary disease) (HCC)      "little bit" (05/14/2017)   Coronary  artery disease, non-occlusive      Coronary calcium scoring: June 2018: Score 1106. 98 percentile for age -> LOW RISK MYOVIEW   Depression     Exercise-induced asthma with acute exacerbation      "only in very hot weather" (05/14/2017)   GERD (gastroesophageal reflux disease)     History of IBS      resovlved   Hx of multiple concussions      from horse training and riding   Hyperlipidemia with target LDL less than 70      Mostly statin intolerant,. Currently on Livalo 4 mg   Hypertension, benign     Lumbosacral spondylosis     PAD (peripheral artery disease) (HCC)      Status post right above-the-knee-below the knee popliteal artery bypass; postop right ABI 0.96.   Pneumonia 2020   Sleep apnea      does not wear CPAP; "think it was medication related" (05/14/2017)   Stroke [facility]) 2010    TIA   Thoracic aortic ectasia (HCC)      ~40 mm on Coronary Calcium Score CT - recommend f/u CTA or MRA   TIA (transient ischemic attack) 09/2011    "several"           Past Surgical History:  Procedure Laterality Date   ABDOMINAL AORTOGRAM W/LOWER EXTREMITY N/A 05/14/2017    Procedure: Abdominal Aortogram w/Lower Extremity;  Surgeon: Conrad [facility], MD;  Location:  [place] INVASIVE CV LAB;  Service: Cardiovascular;  Laterality: N/A;   ACROMIO-CLAVICULAR JOINT REPAIR Left 1990s    "I think it was called an [facility] joint removal"   BACK SURGERY       BYPASS GRAFT POPLITEAL TO POPLITEAL Right 05/15/2017    Procedure: BYPASS GRAFT ABOVE KNEE POPLITEAL TO BELOW KNEE POPLITEAL ARTERY;  Surgeon: Conrad McGregor, MD;  Location: [place];  Service: Vascular;  Laterality: Right;   CARDIAC CATHETERIZATION       CORONARY STENT INTERVENTION N/A 09/30/2021    Procedure: CORONARY STENT INTERVENTION;  Surgeon: Leonie Man, MD;  Location: [place] CV LAB;  Service: Cardiovascular;  Laterality: N/A;   ESOPHAGOGASTRODUODENOSCOPY (EGD) WITH PROPOFOL N/A 04/29/2018    Procedure: ESOPHAGOGASTRODUODENOSCOPY (EGD) WITH  PROPOFOL;  Surgeon: Otis Brace, MD;  Location: [place];  Service: Gastroenterology;  Laterality: N/A;   FOOT FRACTURE SURGERY Bilateral      multiple procedures   FRACTURE SURGERY       INTRAVASCULAR ULTRASOUND/IVUS N/A 09/30/2021    Procedure: Intravascular Ultrasound/IVUS;  Surgeon: Leonie Man, MD;  Location: [place] CV LAB;  Service: Cardiovascular;  Laterality: N/A;   JOINT REPLACEMENT       LEFT HEART CATH AND CORONARY ANGIOGRAPHY N/A 09/30/2021    Procedure: LEFT HEART CATH AND CORONARY ANGIOGRAPHY;  Surgeon: Leonie Man, MD;  Location: [place] CV LAB;  Service: Cardiovascular;  Laterality: N/A;   LOWER EXTREMITY ANGIOGRAPHY N/A 06/16/2020    Procedure: LOWER EXTREMITY ANGIOGRAPHY;  Surgeon: [name], MD;  Location: [place] CV LAB;  Service: Cardiovascular;  Laterality: N/A;   MAXIMUM ACCESS (MAS)POSTERIOR LUMBAR INTERBODY FUSION (PLIF) 1 LEVEL N/A 09/30/2014    Procedure: FOR MAXIMUM ACCESS (MAS) POSTERIOR LUMBAR INTERBODY FUSION (PLIF) 1 LEVEL;  Surgeon: Eustace Moore, MD;  Location: [place] NEURO ORS;  Service: Neurosurgery;  Laterality: N/A;  FOR MAXIMUM ACCESS (MAS) POSTERIOR LUMBAR INTERBODY FUSION (PLIF) 1 LEVEL LUMBAR 4-5   MULTIPLE TOOTH EXTRACTIONS Right 04/2017   NM MYOVIEW LTD   06/2017     Normal EF, 66%. No ST changes. Normal study. LOW RISK.   PERIPHERAL VASCULAR ATHERECTOMY Left 06/16/2020    Procedure: PERIPHERAL VASCULAR ATHERECTOMY;  Surgeon: [name], MD;  Location: [place] CV LAB;  Service: Cardiovascular;  Laterality: Left;  SFA   TOTAL HIP ARTHROPLASTY Right 01/16/2020    Procedure: RIGHT TOTAL HIP ARTHROPLASTY ANTERIOR APPROACH;  Surgeon: Mcarthur Rossetti, MD;  Location: [facility];  Service: Orthopedics;  Laterality: Right;   TOTAL HIP ARTHROPLASTY Left 04/30/2020    Procedure: LEFT TOTAL HIP ARTHROPLASTY ANTERIOR APPROACH;  Surgeon: Mcarthur Rossetti, MD;  Location: [facility];  Service: Orthopedics;   Laterality: Left;   TRANSTHORACIC ECHOCARDIOGRAM   09/07/2013    Done to evaluate TIA: Normal LV size and function. EF 55-60%. GR 1 DD. Mild left atrial dilation. Mildly calcified mitral leaflets. Otherwise normal.      Social History         Socioeconomic History   Marital status: Widowed      Spouse name: [name]    Number of children: 0   Years of education: COLLEGE   Highest education level: Some college, no degree  Occupational History   Occupation: Medical illustrator   Tobacco Use   Smoking status: Some Days      Packs/day: 0.00      Years: 36.00      Total pack years: 0.00      Types: Cigarettes  Passive exposure: Never   Smokeless tobacco: Never   Tobacco comments:      [name] says she smokes one cigarette every couple of weeks. [name] says she has a lot of friends who still smoke  Vaping Use   Vaping Use: Former  Substance and Sexual Activity   Alcohol use: Yes      Alcohol/week: 3.0 standard drinks of alcohol      Types: 3 Glasses of wine per week   Drug use: No   Sexual activity: Not Currently  Other Topics Concern   Not on file  Social History Narrative    Patient lives Alone is a Widow Patient has no children.     Patient is a Medical illustrator.     Patient is right handed.     Patient has BA degree.     Smokes 1 cigarette every couple of weeks.    Social Determinants of Health    Financial Resource Strain: Not on file  Food Insecurity: Not on file  Transportation Needs: Not on file  Physical Activity: Not on file  Stress: Not on file  Social Connections: Not on file  Intimate Partner Violence: Not on file           Family History  Problem Relation Age of Onset   Stroke Father     Heart attack Mother     Stroke Mother              Current Outpatient Medications  Medication Sig Dispense Refill   albuterol (PROVENTIL) (2.5 MG/3ML) 0.083% nebulizer solution 3 ml as needed for wheezing or shortness of breath       albuterol (VENTOLIN HFA) 108 (90 Base)  MCG/ACT inhaler 2 puffs as needed       amLODipine (NORVASC) 10 MG tablet 1 tablet       ANORO ELLIPTA 62.5-25 MCG/INH AEPB Inhale 2 puffs into the lungs daily as needed (wheezing).        Apoaequorin (PREVAGEN) 10 MG CAPS Take 10 mg by mouth daily.       ARIPiprazole (ABILIFY) 2 MG tablet 1 tablet       Ascorbic Acid (VITAMIN C) 1000 MG tablet See admin instructions.       aspirin 81 MG chewable tablet 1 tablet       BINAXNOW COVID-19 AG HOME TEST KIT See admin instructions.       bisoprolol (ZEBETA) 5 MG tablet Take 1 tablet (5 mg total) by mouth daily. 90 tablet 3   buPROPion (WELLBUTRIN SR) 150 MG 12 hr tablet 1 tablet in the morning       Calcium Carbonate-Vitamin D (CALTRATE 600+D PO)         Carboxymethylcellulose Sodium (THERATEARS) 0.25 % SOLN Place 1 drop into both eyes 4 (four) times daily as needed (dry eye).       clopidogrel (PLAVIX) 75 MG tablet Take 1 tablet by mouth daily. 90 tablet 2   desvenlafaxine (PRISTIQ) 100 MG 24 hr tablet 1 tablet       Estradiol 10 MCG TABS vaginal tablet Place 10 mcg vaginally once a week.        Evolocumab (REPATHA) 140 MG/ML SOSY Inject 1 ml       fluticasone (FLONASE) 50 MCG/ACT nasal spray 1 spray in each nostril       LAGEVRIO 200 MG CAPS capsule SMARTSIG:4 Capsule(s) By Mouth Every 12 Hours       lisinopril (ZESTRIL) 10 MG tablet Take 10 mg by  mouth daily.       Multiple Vitamins-Minerals (CENTRUM SILVER 50+WOMEN) TABS Take by mouth.       naphazoline-pheniramine (VISINE) 0.025-0.3 % ophthalmic solution Place 1 drop into both eyes 4 (four) times daily as needed (Dry eye).       oxyCODONE (OXY IR/ROXICODONE) 5 MG immediate release tablet Take 10 mg by mouth 4 (four) times daily.       Oxycodone HCl 10 MG TABS Take 10 mg by mouth in the morning, at noon, in the evening, and at bedtime.       prazosin (MINIPRESS) 1 MG capsule 1 capsule at bedtime       Propylene Glycol (SYSTANE COMPLETE) 0.6 % SOLN Place 1 drop into both eyes 4 (four) times  daily as needed (Dry eye).       Propylene Glycol-Glycerin (SOOTHE) 0.6-0.6 % SOLN 1 drop into affected eye as needed       Propylhexedrine (BENZEDREX) INHA Place 1 each into the nose daily.       tiotropium (SPIRIVA HANDIHALER) 18 MCG inhalation capsule 1 capsule by inhaling the contents of the capsule using the HandiHaler device       tobramycin-dexamethasone (TOBRADEX) ophthalmic solution SMARTSIG:In Eye(s)       traZODone (DESYREL) 50 MG tablet 1 tablet at bedtime as needed       zolpidem (AMBIEN) 10 MG tablet Take 10 mg by mouth at bedtime.  (Patient not taking: Reported on 08/17/2022)       zolpidem (AMBIEN) 5 MG tablet 1 tablet at bedtime. (Patient not taking: Reported on 08/17/2022)        No current facility-administered medications for this visit.           Allergies  Allergen Reactions   Penicillins Swelling and Other (See Comments)      TONGUE SWELLING   PATIENT HAD A PCN REACTION WITH IMMEDIATE RASH, FACIAL/TONGUE/THROAT SWELLING, SOB, OR LIGHTHEADEDNESS WITH HYPOTENSION:  #  #  #  YES  #  #  #   Has patient had a PCN reaction causing severe rash involving mucus membranes or skin necrosis: No Has patient had a PCN reaction that required hospitalization: No Has patient had a PCN reaction occurring within the last 10 years: No     Nucynta Er [Tapentadol Hcl Er] Other (See Comments)   Statins        muscle pain   Statins Support [A-G Pro] Other (See Comments)   Lyrica [Pregabalin] Other (See Comments)      DRY MOUTH        REVIEW OF SYSTEMS:    '[X]'  denotes positive finding, '[ ]'  denotes negative finding Cardiac   Comments:  Chest pain or chest pressure:      Shortness of breath upon exertion:      Short of breath when lying flat:      Irregular heart rhythm:             Vascular      Pain in calf, thigh, or hip brought on by ambulation:      Pain in feet at night that wakes you up from your sleep:       Blood clot in your veins:      Leg swelling:               Pulmonary      Oxygen at home:      Productive cough:       Wheezing:  Neurologic      Sudden weakness in arms or legs:       Sudden numbness in arms or legs:       Sudden onset of difficulty speaking or slurred speech:      Temporary loss of vision in one eye:       Problems with dizziness:              Gastrointestinal      Blood in stool:       Vomited blood:              Genitourinary      Burning when urinating:       Blood in urine:             Psychiatric      Major depression:              Hematologic      Bleeding problems:      Problems with blood clotting too easily:             Skin      Rashes or ulcers:             Constitutional      Fever or chills:          PHYSICAL EXAMINATION:      Vitals:    08/17/22 1056  BP: 111/74  Pulse: 73  Resp: 20  Temp: 97.9 F (36.6 C)  TempSrc: Temporal  SpO2: 96%  Weight: 130 lb 3.2 oz (59.1 kg)  Height: '5\' 5"'  (1.651 m)      General:  WDWN in NAD; vital signs documented above Gait: Not observed HENT: WNL, normocephalic Pulmonary: normal non-labored breathing , without Rales, rhonchi,  wheezing Cardiac: regular HR Abdomen: soft, NT, no masses Skin: without rashes Vascular Exam/Pulses:   Right Left  DP absent absent  PT 1+ (weak) absent    Extremities: without ischemic changes, without Gangrene , without cellulitis; without open wounds;  Musculoskeletal: no muscle wasting or atrophy       Neurologic: A&O X 3;  No focal weakness or paresthesias are detected Psychiatric:  The pt has Normal affect.     Non-Invasive Vascular Imaging:   Right lower extremity arterial duplex demonstrates a widely patent above to below the knee popliteal bypass graft with velocities in the 20s.  Velocities are similar to previous studies.     ABI/TBIToday's ABIToday's TBIPrevious ABIPrevious TBI  +-------+-----------+-----------+------------+------------+  Right  0.87       0.54       1.06         0.54          +-------+-----------+-----------+------------+------------+  Left   0.77       0.36       1.04        0.49          +-------+-----------+-----------+------------+------------+      ASSESSMENT/PLAN:: 71 y.o. female here for follow up for surveillance of PAD     -Subjectively claudication symptoms have worsened in the right lower extremity.  She has also developed claudication symptoms in the left lower extremity which she did not have previously.  She is without rest pain or tissue loss of bilateral lower extremities.  Arterial duplex demonstrates a patent bypass graft however does have velocities in the 20s centimeters per second.  Velocities are consistent with prior studies however she also had a drop in ABI from 1.06-0.87.  She maintains bilateral femoral pulses.  She continues to take her aspirin and Plavix.  We discussed angiography given worsening symptoms.  I will discuss symptoms and imaging study results with Dr. Carlis Abbott however we will tentatively schedule her for angiography on 08/31/2022.  I will notify the patient if plans change.   ADDENDUM 08/18/22 Case was discussed with Dr. Carlis Abbott.  Given arterial duplex findings, drop in ABI, and worsening claudication symptoms we will proceed with angiography as planned on 08/31/22.   Dagoberto Ligas, PA-C [facility] [phone]   Clinic MD:   Scot Dock

## 2022-08-31 NOTE — Progress Notes (Signed)
Site Area:  LFV X1 Site prior to removal: Level 0 Presure applied for: 25 minutes Manual:  yes Pt status during pull:  stable Post pull site:  level 0  Post pull instructions given:  yes Post pull pulses present:  PT doppler Dressing applied:  gauze tegaderm Bedrest begins @: 1430 Comments: Removed by    Vear Clock RN

## 2022-08-31 NOTE — Op Note (Signed)
Patient name: Brenda Carney MRN: [id number] DOB: 26-[name]-1951 Sex: female  08/31/2022 Pre-operative Diagnosis: Threatened right lower extremity bypass with sluggish flow Post-operative diagnosis:  Same Surgeon:  [name], MD Procedure Performed: 1.  Ultrasound-guided access left common femoral artery 2.  Aortogram with catheter selection of aorta 3.  Bilateral lower extremity arteriogram with runoff 4.  Right SFA angioplasty with stent placement x2 (stented with 7 mm x 150 mm Eluvia and 7 mm x 100 mm Eluvia postdilated with a 6 mm Mustang) 5.  63 minutes of monitored moderate conscious sedation time  Indications: Patient is a 71 year old female that previously had a right above the to below-knee popliteal bypass by Dr. Bridgett Larsson in 2018.  I have previously treated a right SFA occlusion proximal to the bypasss in the setting of recurrent claudication and threatened bypass.  She presents for lower extremity arteriogram with a focus on the right leg for sluggish flow in her bypass that appears threatened.  Risk benefits discussed.  She is also having symptoms in her left leg consistent with short distance lifestyle limiting claudication.  Findings:   Difficult access in the left common femoral artery given high-grade calcified stenosis in the left common femoral artery.  Initially, I was subintimal under a plaque and I was able to redirect my wire into the true lumen.  Aortogram showed no flow-limiting stenosis in the aortoiliac segment although heavily calcified and diseased.  On the right ,she has a patent common femoral and profunda.  The SFA is diffusely diseased proximal to the bypass with calcified high-grade stenosis throughout the segment greater than 80%.  The bypass is patent from the above to below-knee popliteal artery.  She has dominant runoff in the peroneal and posterior tibial artery.  The right SFA disease was then stented with a 7 mm x 150 mm Eluvia and 7 mm x 100 mm Eluvia  postdilated with a 6 mm Mustang.  Excellent results with no significant residual stenosis.  Preserved runoff in the bypass.  On the left, she has a high-grade calcified stenosis in the common femoral artery greater than 80%.  The profunda appears diseased with an occluded segment distally although we had difficult visualization with her hip joint.  SFA is patent with a high-grade stenosis in the mid SFA that is very focal.  Patent popliteal with two-vessel runoff in the peroneal and posterior tibial.    Procedure:  The patient was identified in the holding area and taken to room 8.  The patient was then placed supine on the table and prepped and draped in the usual sterile fashion.  A time out was called.  Ultrasound was used to evaluate the left common femoral artery.  It was patent .  A digital ultrasound image was acquired.  A micropuncture needle was used to access the left common femoral artery under ultrasound guidance.  I initially had some trouble given she had a high-grade calcified stenosis in the left common femoral artery that was not identified until we were able to hand inject in the micro sheath.  Finally I was able to direct my wire into the true lumen and placed a short 5 French sheath.  An omniflush catheter was advanced over the wire to the level of L-1.  An abdominal angiogram was obtained.  Next the catheter was pulled down and bilateral lower extremity runoff was obtained.  Pertinent findings are noted above.  Ultimately I used a crossover catheter to get into the right  iliac artery.  We upsized to a 6 [place] sheath.  Patient was given 100 units/kg IV heparin.  I used a Glidewire advantage with a quick cross catheter to get down the right SFA into the bypass at the level of the popliteal artery.  The SFA was then stented with a 7 mm x 150 mm Eluvia and 7 mm x 100 mm Eluvia postdilated with a 6 mm Mustang.  Excellent results.  No significant residual stenosis.  Widely patent bypass at  completion with preserved runoff.  Patient had a heavily calcified left common femoral artery with high-grade stenosis.  I did not feel closure device was appropriate.  Manual pressure will be obtained in recovery after sheath removal.  Plan: Excellent results after stenting of the right SFA for threatened right leg bypass.  Patient will be scheduled for left common femoral endarterectomy in the near future for left leg short distance lifestyle limiting claudication with high-grade stenosis as demonstrated on angiogram.   [name], MD [facility]: [phone]

## 2022-09-01 ENCOUNTER — Encounter (HOSPITAL_COMMUNITY): Payer: Self-pay | Admitting: Vascular Surgery

## 2022-09-04 ENCOUNTER — Other Ambulatory Visit: Payer: Self-pay

## 2022-09-04 DIAGNOSIS — I70202 Unspecified atherosclerosis of native arteries of extremities, left leg: Secondary | ICD-10-CM

## 2022-09-04 DIAGNOSIS — I739 Peripheral vascular disease, unspecified: Secondary | ICD-10-CM

## 2022-09-04 DIAGNOSIS — Z419 Encounter for procedure for purposes other than remedying health state, unspecified: Secondary | ICD-10-CM

## 2022-09-07 ENCOUNTER — Other Ambulatory Visit: Payer: Self-pay | Admitting: Cardiology

## 2022-09-07 ENCOUNTER — Telehealth: Payer: Self-pay | Admitting: Cardiology

## 2022-09-07 DIAGNOSIS — M545 Low back pain, unspecified: Secondary | ICD-10-CM | POA: Diagnosis not present

## 2022-09-07 DIAGNOSIS — M5116 Intervertebral disc disorders with radiculopathy, lumbar region: Secondary | ICD-10-CM | POA: Diagnosis not present

## 2022-09-07 DIAGNOSIS — Z79891 Long term (current) use of opiate analgesic: Secondary | ICD-10-CM | POA: Diagnosis not present

## 2022-09-07 NOTE — Telephone Encounter (Signed)
I will clarify with provider if patient needs to be taking Repatha.

## 2022-09-07 NOTE — Telephone Encounter (Signed)
Pt c/o medication issue:  1. Name of Medication:   Evolocumab (REPATHA) 140 MG/ML SOSY  2. How are you currently taking this medication (dosage and times per day)? As prescribed  3. Are you having a reaction (difficulty breathing--STAT)?   No  4. What is your medication issue?   Caller stated pharmacy records show this medication was discontinued in February 2023 but the patient has been filling this medication.  Caller would like to confirm the patient should still be taking this medication.

## 2022-09-08 ENCOUNTER — Encounter (HOSPITAL_COMMUNITY): Payer: Self-pay | Admitting: Vascular Surgery

## 2022-09-08 MED ORDER — REPATHA 140 MG/ML ~~LOC~~ SOSY: 140.0000 mg | PREFILLED_SYRINGE | SUBCUTANEOUS | 3 refills | Status: DC

## 2022-09-08 NOTE — Anesthesia Preprocedure Evaluation (Signed)
Anesthesia Evaluation  Patient identified by MRN, date of birth, ID band Patient awake    Reviewed: Allergy & Precautions, NPO status , Patient's Chart, lab work & pertinent test results, reviewed documented beta blocker date and time   Airway Mallampati: II  TM Distance: >3 FB Neck ROM: Full    Dental  (+) Dental Advisory Given, Missing   Pulmonary asthma , sleep apnea , COPD,  COPD inhaler, Current Smoker and Patient abstained from smoking.,    Pulmonary exam normal breath sounds clear to auscultation       Cardiovascular hypertension, Pt. on medications and Pt. on home beta blockers + CAD, + Cardiac Stents, + Peripheral Vascular Disease and + DOE  Normal cardiovascular exam Rhythm:Regular Rate:Normal     Neuro/Psych  Headaches, PSYCHIATRIC DISORDERS Depression TIACVA, No Residual Symptoms    GI/Hepatic Neg liver ROS, PUD, GERD  Medicated,  Endo/Other  negative endocrine ROS  Renal/GU negative Renal ROS     Musculoskeletal  (+) Arthritis ,   Abdominal   Peds  Hematology  (+) Blood dyscrasia (Plavix), ,   Anesthesia Other Findings   Reproductive/Obstetrics                          Anesthesia Physical Anesthesia Plan  ASA: 3  Anesthesia Plan: General   Post-op Pain Management: Tylenol PO (pre-op)*   Induction: Intravenous  PONV Risk Score and Plan: 2 and Dexamethasone and Ondansetron  Airway Management Planned: Oral ETT  Additional Equipment: Arterial line  Intra-op Plan:   Post-operative Plan: Extubation in OR  Informed Consent: I have reviewed the patients History and Physical, chart, labs and discussed the procedure including the risks, benefits and alternatives for the proposed anesthesia with the patient or authorized representative who has indicated his/her understanding and acceptance.     Dental advisory given  Plan Discussed with: CRNA  Anesthesia Plan Comments:  (PAT note written 09/08/2022 by Myra Gianotti, PA-C.  2nd PIV. )      Anesthesia Quick Evaluation

## 2022-09-08 NOTE — Progress Notes (Signed)
Anesthesia Chart Review:  Case: [id number] Date/Time: 09/11/22 0915   Procedure: LEFT COMMON FEMORAL ENDARTERECTOMY (Left)   Anesthesia type: General   Pre-op diagnosis: Left femoral artery stenosis; Claudication   Location: [facility] OR ROOM 11 / [place] OR   Surgeons: [name], MD       DISCUSSION: Patient is a 71 year old female scheduled for the above procedure.  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  She was doing well from a cardiac standpoint.  She reported medication compliance.  She completed cardiac rehab and staying physically active with her 11 horses and dogs.  She denied CV symptoms.  Follow-up around 6 months plan.  Per VVS, continue ASA and hold Plavix 5 days prior to surgery.    She is a same-day work-up.  Anesthesia team to evaluate on the day of surgery.   VS: Ht '5\' 5"'  (1.651 m)   Wt 59 kg   BMI 21.63 kg/m  BP Readings from Last 3 Encounters:  08/31/22 (!) 145/98  08/17/22 111/74  05/25/22 105/72   Pulse Readings from Last 3 Encounters:  08/31/22 84  08/17/22 73  05/25/22 72     PROVIDERS: Michael Boston, MD is PCP  Glenetta Hew, MD is cardiologist   LABS: For day of surgery as indicated. Most recent results in [facility] include: Lab Results  Component Value Date   WBC 10.2 09/22/2021   HGB 15.6 (H) 08/31/2022   HCT 46.0 08/31/2022   PLT 385 09/22/2021   GLUCOSE 88 08/31/2022   CHOL 132 02/09/2022   TRIG 97  02/09/2022   HDL 72 02/09/2022   LDLDIRECT 186 (H) 03/30/2015   LDLCALC 42 02/09/2022   ALT 13 02/09/2022   AST 18 02/09/2022   NA 139 08/31/2022   K 3.9 08/31/2022   CL 102 08/31/2022   CREATININE 0.60 08/31/2022   BUN 11 08/31/2022   CO2 22 09/08/2021   TSH 0.735 06/11/2013   INR 1.02 04/28/2018   HGBA1C 5.4 09/08/2021    IMAGES: CT Chest (overread) 09/09/21: IMPRESSION: 1. No acute findings in the imaged extracardiac chest. 2. No evidence of aortic aneurysm. 3. Similar left greater than right lower lobe endobronchial material, which could represent mucoid impaction or chronic aspiration. 4. Similar T8 compression deformity. 5. Aortic Atherosclerosis (ICD10-I70.0) and Emphysema (ICD10-J43.9).    EKG: 10/19/21:  Normal sinus rhythm with sinus arrhythmia Possible left atrial enlargement Septal infarct, age undetermined   CV: Cardiac cath 09/30/21: SUMMARY Severe two-vessel CAD: Consistent with cardiac CTA, mid LCx focal 80% stenosis, but with IVUS there appeared to be more diffuse disease ranging from 40 to 80% with 60 to 75% circumferential mixed calcified and noncalcified plaque burden Successful IVUS guided DES PCI of the proximal to mid LCx using Onyx Frontier DES 3.0 mm x 34 mm deployed to 3.3 mm.  Reducing the stenosis to 0% with TIMI-3 flow pre and post Not seen on CTA was a proximal to mid ulcerated 70-90% irregular plaque just after the first bend that  had appearance of a recently ruptured plaque.  There is downstream 60% stenosis that does not appear flow-limiting. Successful DES PCI of the proximal to mid RCA using Onyx Frontier DES 3.5 mm x 30 mm postdilated in tapered fashion from 4.56m to 3.7 mm.  Reduced stenosis to 0% with TIMI-3 flow pre and post Normal LAD that is relatively diminutive and does not reach the apex as the PDA is wraparound. Hyperdynamic left ventricular function with normal EF and normal pressures.    Echo 09/06/13:  - Procedure narrative:  Transthoracic echocardiography. Technically difficult study with suboptimal image quality. - Left ventricle: The cavity size was normal. Wall thickness was normal. Systolic function was normal. The estimated ejection fraction was in the range of 55% to 60%. Doppler parameters are consistent with abnormal left ventricular relaxation (grade 1 diastolic dysfunction). The E/e' ratio is ~10, suggesting borderline increased LV filling pressure. - Mitral valve: Calcified annulus. Mildly thickened leaflets. Trivial regurgitation. - Left atrium: The atrium was mildly dilated. Volume 32 ml/m2. - Atrial septum: Appears thickened. No defect or patent foramen ovale was identified by color doppler. - Inferior vena cava: The vessel was normal in size; the respirophasic diameter changes were in the normal range (= 50%); findings are consistent with normal central venous pressure.     Carotid duplex 09/06/13: Right: mild calcfic plaque with acoustic shadowing origin ICA. Moderate calcific plaque ECA. Left: mild mixed plaque origin ICA. Bilateral: 1-39% ICA stenosis. Vertebral artery flow is antegrade.   Past Medical History:  Diagnosis Date   Arthritis    "thumbs; hips" (05/14/2017)   Cerebral aneurysm    Right ophthalmic artery, left callosal marginal anterior cerebral artery branch   Cerebrovascular disease    Carotid dopplers which showed no significant increase in velocities, extremely minimal on the left, antegrade vertebral bilaterally.   Cervical spondylosis    Chronic lower back pain    COPD (chronic obstructive pulmonary disease) (HCC)    "little bit" (05/14/2017)   Coronary artery disease, non-occlusive    Coronary calcium scoring: June 2018: Score 1106. 98 percentile for age -> LOW RISK MYOVIEW   Depression    Exercise-induced asthma with acute exacerbation    "only in very hot weather" (05/14/2017)   GERD (gastroesophageal reflux disease)    History of IBS    resovlved   Hx of multiple  concussions    from horse training and riding   Hyperlipidemia with target LDL less than 70    Mostly statin intolerant,. 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   Pneumonia 2020   Sleep apnea    does not wear CPAP; "think it was medication related" (05/14/2017)   Stroke ([facility] 2010   TIA   Thoracic aortic ectasia (HCC)    ~40 mm on Coronary Calcium Score CT - recommend f/u CTA or MRA   TIA (transient ischemic attack) 09/2011   "several"    Past Surgical History:  Procedure Laterality Date   ABDOMINAL AORTOGRAM W/LOWER EXTREMITY N/A 05/14/2017   Procedure: Abdominal Aortogram w/Lower Extremity;  Surgeon: CConrad  MD;  [facility];  Service: Cardiovascular;  Laterality: N/A;   ABDOMINAL AORTOGRAM W/LOWER EXTREMITY N/A 08/31/2022   Procedure: ABDOMINAL AORTOGRAM W/LOWER EXTREMITY;  Surgeon: CMarty Heck MD;  [place] [facility];  Service:  Cardiovascular;  Laterality: N/A;   ACROMIO-CLAVICULAR JOINT REPAIR Left 1990s   "I think it was called an [facility] joint removal"   BACK SURGERY     BYPASS GRAFT POPLITEAL TO POPLITEAL Right 05/15/2017   Procedure: BYPASS GRAFT ABOVE KNEE POPLITEAL TO BELOW KNEE POPLITEAL ARTERY;  Surgeon: Conrad [place], MD;  Location: [place];  Service: Vascular;  Laterality: Right;   CARDIAC CATHETERIZATION     CORONARY STENT INTERVENTION N/A 09/30/2021   Procedure: CORONARY STENT INTERVENTION;  Surgeon: Leonie Man, MD;  Location: [place] [facility];  Service: Cardiovascular;  Laterality: N/A;   ESOPHAGOGASTRODUODENOSCOPY (EGD) WITH PROPOFOL N/A 04/29/2018   Procedure: ESOPHAGOGASTRODUODENOSCOPY (EGD) WITH PROPOFOL;  Surgeon: Otis Brace, MD;  Location: [place];  Service: Gastroenterology;  Laterality: N/A;   FOOT FRACTURE SURGERY Bilateral     multiple procedures   FRACTURE SURGERY     INTRAVASCULAR ULTRASOUND/IVUS N/A 09/30/2021   Procedure: Intravascular Ultrasound/IVUS;  Surgeon: Leonie Man, MD;  Location: [place] [facility] LAB;  Service: Cardiovascular;  Laterality: N/A;   JOINT REPLACEMENT     LEFT HEART CATH AND CORONARY ANGIOGRAPHY N/A 09/30/2021   Procedure: LEFT HEART CATH AND CORONARY ANGIOGRAPHY;  Surgeon: Leonie Man, MD;  Location: [place] [facility];  Service: Cardiovascular;  Laterality: N/A;   LOWER EXTREMITY ANGIOGRAPHY N/A 06/16/2020   Procedure: LOWER EXTREMITY ANGIOGRAPHY;  Surgeon: [name], MD;  Location: [place] [facility];  Service: Cardiovascular;  Laterality: N/A;   MAXIMUM ACCESS (MAS)POSTERIOR LUMBAR INTERBODY FUSION (PLIF) 1 LEVEL N/A 09/30/2014   Procedure: FOR MAXIMUM ACCESS (MAS) POSTERIOR LUMBAR INTERBODY FUSION (PLIF) 1 LEVEL;  Surgeon: Eustace Moore, MD;  Location: [place] [facility];  Service: Neurosurgery;  Laterality: N/A;  FOR MAXIMUM ACCESS (MAS) POSTERIOR LUMBAR INTERBODY FUSION (PLIF) 1 LEVEL LUMBAR 4-5   MULTIPLE TOOTH EXTRACTIONS Right 04/2017   NM MYOVIEW LTD  06/2017    Normal EF, 66%. No ST changes. Normal study. LOW RISK.   PERIPHERAL VASCULAR ATHERECTOMY Left 06/16/2020   Procedure: PERIPHERAL VASCULAR ATHERECTOMY;  Surgeon: [name], MD;  Location: [place] [facility];  Service: Cardiovascular;  Laterality: Left;  SFA   PERIPHERAL VASCULAR INTERVENTION Right 08/31/2022   Procedure: PERIPHERAL VASCULAR INTERVENTION;  Surgeon: [name], MD;  Location: [place] [facility];  Service: Cardiovascular;  Laterality: Right;   TOTAL HIP ARTHROPLASTY Right 01/16/2020   Procedure: RIGHT TOTAL HIP ARTHROPLASTY ANTERIOR APPROACH;  Surgeon: Mcarthur Rossetti, MD;  Location: [facility];  Service: Orthopedics;  Laterality: Right;   TOTAL HIP ARTHROPLASTY Left 04/30/2020   Procedure: LEFT TOTAL HIP ARTHROPLASTY ANTERIOR APPROACH;  Surgeon: Mcarthur Rossetti, MD;   Location: [facility];  Service: Orthopedics;  Laterality: Left;   TRANSTHORACIC ECHOCARDIOGRAM  09/07/2013   Done to evaluate TIA: Normal LV size and function. EF 55-60%. GR 1 DD. Mild left atrial dilation. Mildly calcified mitral leaflets. Otherwise normal.    MEDICATIONS: No current facility-administered medications for this encounter.    albuterol (VENTOLIN HFA) 108 (90 Base) MCG/ACT inhaler   amLODipine (NORVASC) 10 MG tablet   Apoaequorin (PREVAGEN) 10 MG CAPS   ARIPiprazole (ABILIFY) 2 MG tablet   Ascorbic Acid (VITAMIN C) 1000 MG tablet   aspirin 81 MG chewable tablet   BINAXNOW COVID-19 AG HOME TEST KIT   bisoprolol (ZEBETA) 5 MG tablet   Brimonidine Tartrate (LUMIFY) 0.025 % SOLN   buPROPion (WELLBUTRIN XL) 150 MG 24 hr tablet   Calcium Carbonate-Vitamin D (CALTRATE 600+D PO)  clopidogrel (PLAVIX) 75 MG tablet   desvenlafaxine (PRISTIQ) 100 MG 24 hr tablet   diazepam (VALIUM) 10 MG tablet   Estradiol 10 MCG TABS vaginal tablet   Evolocumab (REPATHA) 140 MG/ML SOSY   lisinopril (ZESTRIL) 10 MG tablet   Multiple Vitamins-Minerals (CENTRUM SILVER 50+WOMEN) TABS   naphazoline-pheniramine (VISINE) 0.025-0.3 % ophthalmic solution   Oxycodone HCl 10 MG TABS   prazosin (MINIPRESS) 1 MG capsule   Propylhexedrine (BENZEDREX NA)   tiotropium (SPIRIVA HANDIHALER) 18 MCG inhalation capsule   traZODone (DESYREL) 50 MG tablet    Myra Gianotti, PA-C Surgical Short Stay/Anesthesiology [facility] Phone [phone] [facility] Phone [phone] 09/08/2022 4:21 PM

## 2022-09-08 NOTE — Telephone Encounter (Signed)
Refill sent as [employer]

## 2022-09-08 NOTE — Progress Notes (Signed)
PCP - Dr Cristie Hem Cardiologist - Dr Glenetta Hew  Chest x-ray - n/a EKG - 01/02/22 Stress Test - 07/17/17 ECHO - 09/06/13 Cardiac Cath - 09/30/21  ICD Pacemaker/Loop - n/a  Sleep Study -  Yes CPAP - does not use CPAP  Blood Thinner Instructions:  Last dose of Plavix was on 09/04/22.  Aspirin Instructions:  Last dose of ASA was on 09/04/22.   Anesthesia review: Yes  STOP now taking any Aspirin (unless otherwise instructed by your surgeon), Aleve, Naproxen, Ibuprofen, Motrin, Advil, Goody's, BC's, all herbal medications, fish oil, and all vitamins.   Coronavirus Screening Do you have any of the following symptoms:  Cough yes/no: No Fever (>100.49F)  yes/no: No Runny nose yes/no: No Sore throat yes/no: No Difficulty breathing/shortness of breath  yes/no: No  Have you traveled in the last 14 days and where? yes/no: No  Patient verbalized understanding of instructions that were given via phone.

## 2022-09-08 NOTE — Telephone Encounter (Signed)
Sharyn Lull, please renew this patient's prescription for Repatha.  Thank you.

## 2022-09-11 ENCOUNTER — Inpatient Hospital Stay (HOSPITAL_COMMUNITY)
Admission: RE | Admit: 2022-09-11 | Discharge: 2022-09-12 | DRG: 254 | Disposition: A | Discharge status: Home / Self Care | Payer: Medicare Other | Attending: Vascular Surgery | Admitting: Vascular Surgery

## 2022-09-11 ENCOUNTER — Inpatient Hospital Stay (HOSPITAL_COMMUNITY): Payer: Medicare Other | Admitting: Vascular Surgery

## 2022-09-11 ENCOUNTER — Encounter (HOSPITAL_COMMUNITY): Payer: Self-pay | Admitting: Vascular Surgery

## 2022-09-11 ENCOUNTER — Other Ambulatory Visit: Payer: Self-pay

## 2022-09-11 ENCOUNTER — Encounter (HOSPITAL_COMMUNITY)
Admission: RE | Disposition: A | Discharge status: Home / Self Care | Payer: Self-pay | Source: Home / Self Care | Attending: Vascular Surgery

## 2022-09-11 DIAGNOSIS — G4733 Obstructive sleep apnea (adult) (pediatric): Secondary | ICD-10-CM | POA: Diagnosis present

## 2022-09-11 DIAGNOSIS — F17200 Nicotine dependence, unspecified, uncomplicated: Secondary | ICD-10-CM | POA: Diagnosis not present

## 2022-09-11 DIAGNOSIS — I2510 Atherosclerotic heart disease of native coronary artery without angina pectoris: Secondary | ICD-10-CM | POA: Diagnosis present

## 2022-09-11 DIAGNOSIS — Z7982 Long term (current) use of aspirin: Secondary | ICD-10-CM

## 2022-09-11 DIAGNOSIS — I739 Peripheral vascular disease, unspecified: Secondary | ICD-10-CM | POA: Diagnosis not present

## 2022-09-11 DIAGNOSIS — J449 Chronic obstructive pulmonary disease, unspecified: Secondary | ICD-10-CM | POA: Diagnosis present

## 2022-09-11 DIAGNOSIS — I10 Essential (primary) hypertension: Secondary | ICD-10-CM

## 2022-09-11 DIAGNOSIS — Z7951 Long term (current) use of inhaled steroids: Secondary | ICD-10-CM | POA: Diagnosis not present

## 2022-09-11 DIAGNOSIS — Z79899 Other long term (current) drug therapy: Secondary | ICD-10-CM | POA: Diagnosis not present

## 2022-09-11 DIAGNOSIS — Z8249 Family history of ischemic heart disease and other diseases of the circulatory system: Secondary | ICD-10-CM

## 2022-09-11 DIAGNOSIS — Z823 Family history of stroke: Secondary | ICD-10-CM

## 2022-09-11 DIAGNOSIS — Z888 Allergy status to other drugs, medicaments and biological substances status: Secondary | ICD-10-CM

## 2022-09-11 DIAGNOSIS — Z7902 Long term (current) use of antithrombotics/antiplatelets: Secondary | ICD-10-CM

## 2022-09-11 DIAGNOSIS — E785 Hyperlipidemia, unspecified: Secondary | ICD-10-CM | POA: Diagnosis present

## 2022-09-11 DIAGNOSIS — I998 Other disorder of circulatory system: Secondary | ICD-10-CM | POA: Diagnosis not present

## 2022-09-11 DIAGNOSIS — Z419 Encounter for procedure for purposes other than remedying health state, unspecified: Secondary | ICD-10-CM

## 2022-09-11 DIAGNOSIS — F17210 Nicotine dependence, cigarettes, uncomplicated: Secondary | ICD-10-CM | POA: Diagnosis present

## 2022-09-11 DIAGNOSIS — Z8673 Personal history of transient ischemic attack (TIA), and cerebral infarction without residual deficits: Secondary | ICD-10-CM

## 2022-09-11 DIAGNOSIS — Z880 Allergy status to penicillin: Secondary | ICD-10-CM

## 2022-09-11 DIAGNOSIS — I70202 Unspecified atherosclerosis of native arteries of extremities, left leg: Secondary | ICD-10-CM | POA: Diagnosis present

## 2022-09-11 DIAGNOSIS — Z96643 Presence of artificial hip joint, bilateral: Secondary | ICD-10-CM | POA: Diagnosis present

## 2022-09-11 DIAGNOSIS — K219 Gastro-esophageal reflux disease without esophagitis: Secondary | ICD-10-CM | POA: Diagnosis present

## 2022-09-11 DIAGNOSIS — Z981 Arthrodesis status: Secondary | ICD-10-CM

## 2022-09-11 DIAGNOSIS — Z79818 Long term (current) use of other agents affecting estrogen receptors and estrogen levels: Secondary | ICD-10-CM

## 2022-09-11 DIAGNOSIS — I70212 Atherosclerosis of native arteries of extremities with intermittent claudication, left leg: Secondary | ICD-10-CM | POA: Diagnosis not present

## 2022-09-11 HISTORY — PX: ENDARTERECTOMY FEMORAL: SHX5804

## 2022-09-11 HISTORY — PX: PATCH ANGIOPLASTY: SHX6230

## 2022-09-11 LAB — COMPREHENSIVE METABOLIC PANEL
ALT: 11 U/L (ref 0–44)
AST: 25 U/L (ref 15–41)
Albumin: 3.3 g/dL — ABNORMAL LOW (ref 3.5–5.0)
Alkaline Phosphatase: 61 U/L (ref 38–126)
Anion gap: 12 (ref 5–15)
BUN: 8 mg/dL (ref 8–23)
CO2: 21 mmol/L — ABNORMAL LOW (ref 22–32)
Calcium: 8.5 mg/dL — ABNORMAL LOW (ref 8.9–10.3)
Chloride: 106 mmol/L (ref 98–111)
Creatinine, Ser: 0.81 mg/dL (ref 0.44–1.00)
GFR, Estimated: >60 mL/min (ref >60)
Glucose, Bld: 85 mg/dL (ref 70–99)
Potassium: 3.8 mmol/L (ref 3.5–5.1)
Sodium: 139 mmol/L (ref 135–145)
Total Bilirubin: 0.6 mg/dL (ref 0.3–1.2)
Total Protein: 5.7 g/dL — ABNORMAL LOW (ref 6.5–8.1)

## 2022-09-11 LAB — URINALYSIS, ROUTINE W REFLEX MICROSCOPIC
Bilirubin Urine: NEGATIVE
Glucose, UA: NEGATIVE mg/dL
Hgb urine dipstick: NEGATIVE
Ketones, ur: NEGATIVE mg/dL
Leukocytes,Ua: NEGATIVE
Nitrite: NEGATIVE
Protein, ur: NEGATIVE mg/dL
Specific Gravity, Urine: 1.011 (ref 1.005–1.030)
pH: 5 (ref 5.0–8.0)

## 2022-09-11 LAB — CBC
HCT: 40.1 % (ref 36.0–46.0)
Hemoglobin: 14 g/dL (ref 12.0–15.0)
MCH: 34.7 pg — ABNORMAL HIGH (ref 26.0–34.0)
MCHC: 34.9 g/dL (ref 30.0–36.0)
MCV: 99.5 fL (ref 80.0–100.0)
Platelets: 449 10*3/uL — ABNORMAL HIGH (ref 150–400)
RBC: 4.03 MIL/uL (ref 3.87–5.11)
RDW: 12.8 % (ref 11.5–15.5)
WBC: 9.9 10*3/uL (ref 4.0–10.5)
nRBC: 0 % (ref 0.0–0.2)

## 2022-09-11 LAB — PROTIME-INR
INR: 1 (ref 0.8–1.2)
Prothrombin Time: 12.8 seconds (ref 11.4–15.2)

## 2022-09-11 LAB — POCT ACTIVATED CLOTTING TIME
Activated Clotting Time: 221 seconds
Activated Clotting Time: 233 seconds
Activated Clotting Time: 239 seconds
Activated Clotting Time: 263 seconds
Activated Clotting Time: 245 seconds

## 2022-09-11 LAB — TYPE AND SCREEN
ABO/RH(D): A POS
Antibody Screen: NEGATIVE

## 2022-09-11 LAB — MRSA NEXT GEN BY PCR, NASAL: MRSA by PCR Next Gen: NOT DETECTED

## 2022-09-11 LAB — APTT: aPTT: 27 seconds (ref 24–36)

## 2022-09-11 SURGERY — ENDARTERECTOMY, FEMORAL
Anesthesia: General | Site: Groin | Laterality: Left

## 2022-09-11 MED ORDER — METOPROLOL TARTRATE 5 MG/5ML IV SOLN: 2.0000 mg | INTRAVENOUS | Status: DC | PRN

## 2022-09-11 MED ORDER — DESVENLAFAXINE SUCCINATE ER 100 MG PO TB24
100.0000 mg | ORAL_TABLET | Freq: Every day | ORAL | Status: DC
  Administered 2022-09-12: 100 mg via ORAL
  Filled 2022-09-11: qty 1

## 2022-09-11 MED ORDER — EPHEDRINE SULFATE-NACL 50-0.9 MG/10ML-% IV SOSY
PREFILLED_SYRINGE | INTRAVENOUS | Status: DC | PRN
  Administered 2022-09-11: 5 mg via INTRAVENOUS

## 2022-09-11 MED ORDER — OXYCODONE HCL 5 MG PO TABS
ORAL_TABLET | ORAL | Status: AC
  Filled 2022-09-11: qty 1

## 2022-09-11 MED ORDER — FENTANYL CITRATE (PF) 250 MCG/5ML IJ SOLN
INTRAMUSCULAR | Status: AC
  Filled 2022-09-11: qty 5

## 2022-09-11 MED ORDER — VANCOMYCIN HCL IN DEXTROSE 1-5 GM/200ML-% IV SOLN
1000.0000 mg | Freq: Two times a day (BID) | INTRAVENOUS | Status: DC
  Administered 2022-09-12: 1000 mg via INTRAVENOUS
  Filled 2022-09-11 (×2): qty 200

## 2022-09-11 MED ORDER — PHENYLEPHRINE HCL-NACL 20-0.9 MG/250ML-% IV SOLN
INTRAVENOUS | Status: DC | PRN
  Administered 2022-09-11: 50 ug/min via INTRAVENOUS

## 2022-09-11 MED ORDER — PRAZOSIN HCL 1 MG PO CAPS
1.0000 mg | ORAL_CAPSULE | Freq: Every day | ORAL | Status: DC
  Filled 2022-09-11: qty 1

## 2022-09-11 MED ORDER — HEPARIN SODIUM (PORCINE) 5000 UNIT/ML IJ SOLN
5000.0000 [IU] | Freq: Three times a day (TID) | INTRAMUSCULAR | Status: DC
  Administered 2022-09-12: 5000 [IU] via SUBCUTANEOUS
  Filled 2022-09-11: qty 1

## 2022-09-11 MED ORDER — ESMOLOL HCL 100 MG/10ML IV SOLN
INTRAVENOUS | Status: DC | PRN
  Administered 2022-09-11: 40 mg via INTRAVENOUS

## 2022-09-11 MED ORDER — TRAZODONE HCL 50 MG PO TABS: 50.0000 mg | ORAL_TABLET | Freq: Every day | ORAL | Status: DC

## 2022-09-11 MED ORDER — LIDOCAINE 2% (20 MG/ML) 5 ML SYRINGE
INTRAMUSCULAR | Status: DC | PRN
  Administered 2022-09-11: 80 mg via INTRAVENOUS

## 2022-09-11 MED ORDER — CHLORHEXIDINE GLUCONATE CLOTH 2 % EX PADS: 6.0000 | MEDICATED_PAD | Freq: Once | CUTANEOUS | Status: DC

## 2022-09-11 MED ORDER — GUAIFENESIN-DM 100-10 MG/5ML PO SYRP: 15.0000 mL | ORAL_SOLUTION | ORAL | Status: DC | PRN

## 2022-09-11 MED ORDER — CHLORHEXIDINE GLUCONATE 0.12 % MT SOLN
OROMUCOSAL | Status: AC
  Administered 2022-09-11: 15 mL
  Filled 2022-09-11: qty 15

## 2022-09-11 MED ORDER — PROTAMINE SULFATE 10 MG/ML IV SOLN
INTRAVENOUS | Status: DC | PRN
  Administered 2022-09-11: 20 mg via INTRAVENOUS
  Administered 2022-09-11: 25 mg via INTRAVENOUS
  Administered 2022-09-11: 5 mg via INTRAVENOUS

## 2022-09-11 MED ORDER — ONDANSETRON HCL 4 MG/2ML IJ SOLN: 4.0000 mg | Freq: Once | INTRAMUSCULAR | Status: DC | PRN

## 2022-09-11 MED ORDER — PROPOFOL 10 MG/ML IV BOLUS
INTRAVENOUS | Status: AC
  Filled 2022-09-11: qty 20

## 2022-09-11 MED ORDER — PROPOFOL 10 MG/ML IV BOLUS
INTRAVENOUS | Status: DC | PRN
  Administered 2022-09-11: 120 mg via INTRAVENOUS

## 2022-09-11 MED ORDER — HEPARIN 6000 UNIT IRRIGATION SOLUTION
Status: AC
  Filled 2022-09-11: qty 500

## 2022-09-11 MED ORDER — HYDRALAZINE HCL 20 MG/ML IJ SOLN: 5.0000 mg | INTRAMUSCULAR | Status: DC | PRN

## 2022-09-11 MED ORDER — ONDANSETRON HCL 4 MG/2ML IJ SOLN
INTRAMUSCULAR | Status: DC | PRN
  Administered 2022-09-11: 4 mg via INTRAVENOUS

## 2022-09-11 MED ORDER — CHLORHEXIDINE GLUCONATE 0.12 % MT SOLN: 15.0000 mL | Freq: Once | OROMUCOSAL | Status: DC

## 2022-09-11 MED ORDER — HYDROMORPHONE HCL 1 MG/ML IJ SOLN
INTRAMUSCULAR | Status: DC | PRN
  Administered 2022-09-11: .5 mg via INTRAVENOUS

## 2022-09-11 MED ORDER — HYDROMORPHONE HCL 1 MG/ML IJ SOLN: 0.5000 mg | INTRAMUSCULAR | Status: DC | PRN

## 2022-09-11 MED ORDER — EVOLOCUMAB 140 MG/ML ~~LOC~~ SOSY: 140.0000 mg | PREFILLED_SYRINGE | SUBCUTANEOUS | Status: DC

## 2022-09-11 MED ORDER — SODIUM CHLORIDE 0.9 % IV SOLN: 500.0000 mL | Freq: Once | INTRAVENOUS | Status: DC | PRN

## 2022-09-11 MED ORDER — 0.9 % SODIUM CHLORIDE (POUR BTL) OPTIME
TOPICAL | Status: DC | PRN
  Administered 2022-09-11: 2000 mL
  Administered 2022-09-11: 500 mL

## 2022-09-11 MED ORDER — HEMOSTATIC AGENTS (NO CHARGE) OPTIME
TOPICAL | Status: DC | PRN
  Administered 2022-09-11 (×3): 1 via TOPICAL

## 2022-09-11 MED ORDER — SUGAMMADEX SODIUM 200 MG/2ML IV SOLN
INTRAVENOUS | Status: DC | PRN
  Administered 2022-09-11: 200 mg via INTRAVENOUS

## 2022-09-11 MED ORDER — ARIPIPRAZOLE 2 MG PO TABS
2.0000 mg | ORAL_TABLET | Freq: Every morning | ORAL | Status: DC
  Administered 2022-09-12: 2 mg via ORAL
  Filled 2022-09-11: qty 1

## 2022-09-11 MED ORDER — LABETALOL HCL 5 MG/ML IV SOLN: 10.0000 mg | INTRAVENOUS | Status: DC | PRN

## 2022-09-11 MED ORDER — FENTANYL CITRATE (PF) 250 MCG/5ML IJ SOLN
INTRAMUSCULAR | Status: DC | PRN
  Administered 2022-09-11 (×2): 50 ug via INTRAVENOUS
  Administered 2022-09-11: 75 ug via INTRAVENOUS
  Administered 2022-09-11: 50 ug via INTRAVENOUS

## 2022-09-11 MED ORDER — AMLODIPINE BESYLATE 10 MG PO TABS
10.0000 mg | ORAL_TABLET | Freq: Every day | ORAL | Status: DC
  Administered 2022-09-12: 10 mg via ORAL
  Filled 2022-09-11: qty 1

## 2022-09-11 MED ORDER — ONDANSETRON HCL 4 MG/2ML IJ SOLN: 4.0000 mg | Freq: Four times a day (QID) | INTRAMUSCULAR | Status: DC | PRN

## 2022-09-11 MED ORDER — SODIUM CHLORIDE 0.9 % IV SOLN: INTRAVENOUS | Status: DC

## 2022-09-11 MED ORDER — PHENYLEPHRINE 80 MCG/ML (10ML) SYRINGE FOR IV PUSH (FOR BLOOD PRESSURE SUPPORT)
PREFILLED_SYRINGE | INTRAVENOUS | Status: DC | PRN
  Administered 2022-09-11 (×2): 80 ug via INTRAVENOUS
  Administered 2022-09-11: 160 ug via INTRAVENOUS
  Administered 2022-09-11: 80 ug via INTRAVENOUS

## 2022-09-11 MED ORDER — DIAZEPAM 5 MG PO TABS: 5.0000 mg | ORAL_TABLET | Freq: Every day | ORAL | Status: DC

## 2022-09-11 MED ORDER — ALUM & MAG HYDROXIDE-SIMETH 200-200-20 MG/5ML PO SUSP: 15.0000 mL | ORAL | Status: DC | PRN

## 2022-09-11 MED ORDER — HEPARIN SODIUM (PORCINE) 1000 UNIT/ML IJ SOLN
INTRAMUSCULAR | Status: DC | PRN
  Administered 2022-09-11: 6000 [IU] via INTRAVENOUS
  Administered 2022-09-11 (×3): 2000 [IU] via INTRAVENOUS

## 2022-09-11 MED ORDER — ASPIRIN 81 MG PO CHEW
81.0000 mg | CHEWABLE_TABLET | Freq: Every day | ORAL | Status: DC
  Administered 2022-09-12: 81 mg via ORAL
  Filled 2022-09-11: qty 1

## 2022-09-11 MED ORDER — FENTANYL CITRATE (PF) 100 MCG/2ML IJ SOLN
INTRAMUSCULAR | Status: AC
  Filled 2022-09-11: qty 2

## 2022-09-11 MED ORDER — ACETAMINOPHEN 325 MG PO TABS
ORAL_TABLET | ORAL | Status: AC
  Filled 2022-09-11: qty 2

## 2022-09-11 MED ORDER — BISOPROLOL FUMARATE 5 MG PO TABS
5.0000 mg | ORAL_TABLET | Freq: Every day | ORAL | Status: DC
  Administered 2022-09-12: 5 mg via ORAL
  Filled 2022-09-11: qty 1

## 2022-09-11 MED ORDER — OXYCODONE HCL 5 MG PO TABS
5.0000 mg | ORAL_TABLET | ORAL | Status: DC | PRN
  Administered 2022-09-11: 5 mg via ORAL

## 2022-09-11 MED ORDER — POTASSIUM CHLORIDE CRYS ER 20 MEQ PO TBCR
20.0000 meq | EXTENDED_RELEASE_TABLET | Freq: Every day | ORAL | Status: AC | PRN
  Administered 2022-09-12: 40 meq via ORAL
  Filled 2022-09-11: qty 2

## 2022-09-11 MED ORDER — PHENOL 1.4 % MT LIQD: 1.0000 | OROMUCOSAL | Status: DC | PRN

## 2022-09-11 MED ORDER — HEPARIN 6000 UNIT IRRIGATION SOLUTION
Status: DC | PRN
  Administered 2022-09-11: 1

## 2022-09-11 MED ORDER — CLOPIDOGREL BISULFATE 75 MG PO TABS
75.0000 mg | ORAL_TABLET | Freq: Every day | ORAL | Status: DC
  Administered 2022-09-12: 75 mg via ORAL
  Filled 2022-09-11: qty 1

## 2022-09-11 MED ORDER — ACETAMINOPHEN 325 MG RE SUPP: 325.0000 mg | RECTAL | Status: DC | PRN

## 2022-09-11 MED ORDER — ACETAMINOPHEN 500 MG PO TABS
1000.0000 mg | ORAL_TABLET | Freq: Once | ORAL | Status: AC
  Administered 2022-09-11: 1000 mg via ORAL
  Filled 2022-09-11: qty 2

## 2022-09-11 MED ORDER — DOCUSATE SODIUM 100 MG PO CAPS
100.0000 mg | ORAL_CAPSULE | Freq: Every day | ORAL | Status: DC
  Administered 2022-09-12: 100 mg via ORAL
  Filled 2022-09-11: qty 1

## 2022-09-11 MED ORDER — SENNOSIDES-DOCUSATE SODIUM 8.6-50 MG PO TABS: 1.0000 | ORAL_TABLET | Freq: Every evening | ORAL | Status: DC | PRN

## 2022-09-11 MED ORDER — BUPROPION HCL ER (XL) 150 MG PO TB24
150.0000 mg | ORAL_TABLET | Freq: Every morning | ORAL | Status: DC
  Administered 2022-09-12: 150 mg via ORAL
  Filled 2022-09-11: qty 1

## 2022-09-11 MED ORDER — PANTOPRAZOLE SODIUM 40 MG PO TBEC
40.0000 mg | DELAYED_RELEASE_TABLET | Freq: Every day | ORAL | Status: DC
  Administered 2022-09-12: 40 mg via ORAL
  Filled 2022-09-11: qty 1

## 2022-09-11 MED ORDER — VANCOMYCIN HCL IN DEXTROSE 1-5 GM/200ML-% IV SOLN
1000.0000 mg | INTRAVENOUS | Status: AC
  Administered 2022-09-11: 1000 mg via INTRAVENOUS
  Filled 2022-09-11: qty 200

## 2022-09-11 MED ORDER — LACTATED RINGERS IV SOLN: INTRAVENOUS | Status: DC

## 2022-09-11 MED ORDER — HYDROMORPHONE HCL 1 MG/ML IJ SOLN
INTRAMUSCULAR | Status: AC
  Filled 2022-09-11: qty 0.5

## 2022-09-11 MED ORDER — BISACODYL 5 MG PO TBEC: 5.0000 mg | DELAYED_RELEASE_TABLET | Freq: Every day | ORAL | Status: DC | PRN

## 2022-09-11 MED ORDER — ACETAMINOPHEN 325 MG PO TABS
325.0000 mg | ORAL_TABLET | ORAL | Status: DC | PRN
  Administered 2022-09-11: 650 mg via ORAL

## 2022-09-11 MED ORDER — ROCURONIUM BROMIDE 10 MG/ML (PF) SYRINGE
PREFILLED_SYRINGE | INTRAVENOUS | Status: DC | PRN
  Administered 2022-09-11: 40 mg via INTRAVENOUS
  Administered 2022-09-11: 60 mg via INTRAVENOUS

## 2022-09-11 MED ORDER — ALBUMIN HUMAN 5 % IV SOLN: INTRAVENOUS | Status: DC | PRN

## 2022-09-11 MED ORDER — FENTANYL CITRATE (PF) 100 MCG/2ML IJ SOLN
25.0000 ug | INTRAMUSCULAR | Status: DC | PRN
  Administered 2022-09-11: 25 ug via INTRAVENOUS

## 2022-09-11 MED ORDER — MAGNESIUM SULFATE 2 GM/50ML IV SOLN: 2.0000 g | Freq: Every day | INTRAVENOUS | Status: DC | PRN

## 2022-09-11 MED ORDER — ORAL CARE MOUTH RINSE: 15.0000 mL | Freq: Once | OROMUCOSAL | Status: DC

## 2022-09-11 MED ORDER — LISINOPRIL 10 MG PO TABS
10.0000 mg | ORAL_TABLET | Freq: Every day | ORAL | Status: DC
  Administered 2022-09-12: 10 mg via ORAL
  Filled 2022-09-11: qty 1

## 2022-09-11 MED ORDER — HYDROCODONE-ACETAMINOPHEN 10-325 MG PO TABS: 1.0000 | ORAL_TABLET | Freq: Four times a day (QID) | ORAL | Status: DC | PRN

## 2022-09-11 SURGICAL SUPPLY — 47 items
BAG COUNTER SPONGE SURGICOUNT (BAG) ×1 IMPLANT
CANISTER SUCT 3000ML PPV (MISCELLANEOUS) ×1 IMPLANT
CLIP VESOCCLUDE MED 24/CT (CLIP) ×1 IMPLANT
CLIP VESOCCLUDE SM WIDE 24/CT (CLIP) ×1 IMPLANT
DERMABOND ADVANCED .7 DNX12 (GAUZE/BANDAGES/DRESSINGS) ×1 IMPLANT
DRAIN CHANNEL 15F RND FF W/TCR (WOUND CARE) IMPLANT
DRAPE HALF SHEET 40X57 (DRAPES) IMPLANT
ELECT REM PT RETURN 9FT ADLT (ELECTROSURGICAL) ×1
ELECTRODE REM PT RTRN 9FT ADLT (ELECTROSURGICAL) ×1 IMPLANT
EVACUATOR SILICONE 100CC (DRAIN) IMPLANT
GLOVE BIO SURGEON STRL SZ 6.5 (GLOVE) IMPLANT
GLOVE BIO SURGEON STRL SZ7.5 (GLOVE) ×1 IMPLANT
GLOVE BIOGEL PI IND STRL 7.0 (GLOVE) IMPLANT
GLOVE BIOGEL PI IND STRL 7.5 (GLOVE) IMPLANT
GLOVE BIOGEL PI IND STRL 8 (GLOVE) ×1 IMPLANT
GLOVE ECLIPSE 7.0 STRL STRAW (GLOVE) IMPLANT
GLOVE SURG SS PI 7.5 STRL IVOR (GLOVE) IMPLANT
GOWN STRL REUS W/ TWL LRG LVL3 (GOWN DISPOSABLE) ×3 IMPLANT
GOWN STRL REUS W/ TWL XL LVL3 (GOWN DISPOSABLE) ×2 IMPLANT
GOWN STRL REUS W/TWL 2XL LVL3 (GOWN DISPOSABLE) IMPLANT
GOWN STRL REUS W/TWL LRG LVL3 (GOWN DISPOSABLE) ×3
GOWN STRL REUS W/TWL XL LVL3 (GOWN DISPOSABLE) ×1
GRAFT VASC PATCH XENOSURE 1X14 (Vascular Products) IMPLANT
HEMOSTAT SNOW SURGICEL 2X4 (HEMOSTASIS) IMPLANT
HEMOSTAT SPONGE AVITENE ULTRA (HEMOSTASIS) IMPLANT
KIT BASIN OR (CUSTOM PROCEDURE TRAY) ×1 IMPLANT
KIT TURNOVER KIT B (KITS) ×1 IMPLANT
LOOP VESSEL MINI RED (MISCELLANEOUS) IMPLANT
MARKER SKIN DUAL TIP RULER LAB (MISCELLANEOUS) IMPLANT
NS IRRIG 1000ML POUR BTL (IV SOLUTION) ×2 IMPLANT
PACK PERIPHERAL VASCULAR (CUSTOM PROCEDURE TRAY) ×1 IMPLANT
PAD ARMBOARD 7.5X6 YLW CONV (MISCELLANEOUS) ×2 IMPLANT
PENCIL BUTTON HOLSTER BLD 10FT (ELECTRODE) IMPLANT
SHEATH PROBE COVER 6X72 (BAG) IMPLANT
SPONGE T-LAP 18X18 ~~LOC~~+RFID (SPONGE) IMPLANT
SUT MNCRL AB 4-0 PS2 18 (SUTURE) ×1 IMPLANT
SUT MON AB 4-0 PC3 18 (SUTURE) IMPLANT
SUT PROLENE 5 0 C 1 24 (SUTURE) ×1 IMPLANT
SUT PROLENE 6 0 BV (SUTURE) IMPLANT
SUT VIC AB 2-0 CT1 27 (SUTURE) ×2
SUT VIC AB 2-0 CT1 TAPERPNT 27 (SUTURE) ×1 IMPLANT
SUT VIC AB 3-0 SH 27 (SUTURE) ×1
SUT VIC AB 3-0 SH 27X BRD (SUTURE) ×1 IMPLANT
TOWEL GREEN STERILE (TOWEL DISPOSABLE) ×1 IMPLANT
TRAY FOLEY MTR SLVR 16FR STAT (SET/KITS/TRAYS/PACK) IMPLANT
UNDERPAD 30X36 HEAVY ABSORB (UNDERPADS AND DIAPERS) ×1 IMPLANT
WATER STERILE IRR 1000ML POUR (IV SOLUTION) ×1 IMPLANT

## 2022-09-11 NOTE — Op Note (Signed)
Date: September 11, 2022  Preoperative diagnosis: Short distance lifestyle limiting claudication left lower extremity  Postoperative diagnosis: Same  Procedure: 1.  Left common femoral endarterectomy with profundoplasty and bovine pericardial patch angioplasty 2.  Left SFA endarterectomy with bovine pericardial patch angioplasty  Surgeon: Dr. [name], MD  Assistant: Dr. Melene Muller, MD and Paulo Fruit, [place]  Indications: 71 year old female well-known to vascular surgery that presents with short distance lifestyle limiting claudication in the left lower extremity.  Recent arteriogram showed heavily calcified high-grade stenosis in the left common femoral artery >80% with an occluded profunda and disease in the proximal SFA.  She presents today for left femoral endarterectomy with patch angioplasty after risks benefits discussed.  An assistant was needed for exposure and to expedite the case.   Findings: The left common femoral artery was heavily diseased and calcified and scarred in from previous percutaneous access.  Ultimately the distal external iliac artery was dissected out all the way down to the SFA and profunda branches.  Initially did endarterectomy from the common femoral onto the profunda for about 4 cm until we got backbleeding.  The profunda was previously occluded for a long segment.  Ultimately we had occlusive signal in the SFA after this so we then opened a separate arteriotomy in the proximal SFA and tacked down the distal endpoint of the endarterectomy and performed a second patch angioplasty with bovine on the proximal SFA.  Brisk Doppler signals at completion.  Anesthesia: General  Details: Patient was taken to the operating room after informed consent was obtained.  Placed on the operative table supine position.  General endotracheal anesthesia induced.  Left groin was then prepped and draped in standard sterile fashion.  Antibiotics were given.  Timeout  performed.  I made a transverse incision in the left groin and then dissected down through the subcutaneous tissue and the femoral sheath was opened longitudinally using cerebellar retractors for added visualization.  Dissected out the common femoral artery as well as the external iliac artery, SFA, and profunda and all these branches were controlled with Vesseloops.  The profunda was heavily calcified as was the distal external iliac artery and the groin was scarred in place.  Patient was given 100 units/kg IV heparin.  We checked an ACT to maintain greater than 250.  I placed a Henley clamp on the distal external iliac with Vesseloops on SFA and profunda.  The left common femoral artery was opened with 11 blade scalpel and extended with Potts scissors.  Extensive endarterectomy was then performed of the common femoral all the way down onto the profunda branch point.  The profunda was heavily diseased and occluded and we were able to get backbleeding.  We also performed an eversion endarterectomy of the SFA.  A long bovine patch was then sewn from the profunda up to the common femoral with 5-0 Prolene parachute technique with the help of my assistant.  The artery was de-aired prior to completion.  We had a very thin area in the proximal SFA and placed several pledgets here where the eversion endarterectomy was performed.  Ultimately we did not get good hemostasis.  We elected to open a second arteriotomy here given we also had an occlusive SFA signal.  The artery was recontrolled.  Endarterectomy was then further completed in the proximal SFA under direct visualization and we tacked down the endpoint in the SFA with 6-0 Prolene's and placed a second patch with a 5-0 Prolene parachute technique with the help  of my assistant.  This was de-aired prior to completion.  Excellent Doppler signals in the SFA as well as a Doppler signal in the profunda.  There was signals in the foot.  Protamine was given.  The groin was  closed in multiple layers of 2-0 Vicryl, 3-0 Vicryl, 4-0 Monocryl and Dermabond.  Taken to recovery in stable condition.    Complication: None  Condition: Stable  [name], MD [facility] Office: [place]

## 2022-09-11 NOTE — Anesthesia Postprocedure Evaluation (Signed)
Anesthesia Post Note  Patient: Brenda Carney  Procedure(s) Performed: LEFT COMMON FEMORAL ENDARTERECTOMY (Left: Groin) LEFT FEMORAL PATCH ANGIOPLASTY USING XENOSURE BIOLOGIC PATCH 1CMX14CM (Left: Groin)     Patient location during evaluation: PACU Anesthesia Type: General Level of consciousness: awake and alert Pain management: pain level controlled Vital Signs Assessment: post-procedure vital signs reviewed and stable Respiratory status: spontaneous breathing, nonlabored ventilation, respiratory function stable and patient connected to nasal cannula oxygen Cardiovascular status: blood pressure returned to baseline and stable Postop Assessment: no apparent nausea or vomiting Anesthetic complications: no   No notable events documented.  Last Vitals:  Vitals:   09/11/22 1345 09/11/22 1400  BP: (!) 112/51 104/76  Pulse: 86 81  Resp: 16 (!) 8  Temp:    SpO2: 98% 97%    Last Pain:  Vitals:   09/11/22 1400  TempSrc:   PainSc: [place]

## 2022-09-11 NOTE — Anesthesia Procedure Notes (Signed)
Procedure Name: Intubation Date/Time: 09/11/2022 10:26 AM  Performed by: Georgia Duff, CRNAPre-anesthesia Checklist: Patient identified, Emergency Drugs available, Suction available and Patient being monitored Patient Re-evaluated:Patient Re-evaluated prior to induction Oxygen Delivery Method: Circle System Utilized Preoxygenation: Pre-oxygenation with 100% oxygen Induction Type: IV induction Ventilation: Mask ventilation without difficulty Laryngoscope Size: 2 and Miller Grade View: Grade I Tube type: Oral Tube size: 7.0 mm Number of attempts: 1 Airway Equipment and Method: Stylet and Oral airway Placement Confirmation: ETT inserted through vocal cords under direct vision, positive ETCO2 and breath sounds checked- equal and bilateral Secured at: 22 cm Tube secured with: Tape Dental Injury: Teeth and Oropharynx as per pre-operative assessment

## 2022-09-11 NOTE — Transfer of Care (Signed)
Immediate Anesthesia Transfer of Care Note  Patient: Brenda Carney  Procedure(s) Performed: LEFT COMMON FEMORAL ENDARTERECTOMY (Left: Groin) LEFT FEMORAL PATCH ANGIOPLASTY USING XENOSURE BIOLOGIC PATCH 1CMX14CM (Left: Groin)  Patient Location: PACU  Anesthesia Type:General  Level of Consciousness: drowsy and patient cooperative  Airway & Oxygen Therapy: Patient Spontanous Breathing  Post-op Assessment: Report given to RN and Post -op Vital signs reviewed and stable  Post vital signs: Reviewed and stable  Last Vitals:  Vitals Value Taken Time  BP    Temp    Pulse    Resp    SpO2      Last Pain:  Vitals:   09/11/22 0750  TempSrc:   PainSc: 6          Complications: No notable events documented.

## 2022-09-11 NOTE — H&P (Signed)
History and Physical Interval Note:  09/11/2022 10:07 AM  Brenda Carney  has presented today for surgery, with the diagnosis of Left femoral artery stenosis; Claudication.  The various methods of treatment have been discussed with the patient and family. After consideration of risks, benefits and other options for treatment, the patient has consented to  Procedure(s): LEFT COMMON FEMORAL ENDARTERECTOMY (Left) as a surgical intervention.  The patient's history has been reviewed, patient examined, no change in status, stable for surgery.  I have reviewed the patient's chart and labs.  Questions were answered to the patient's satisfaction.    Plan for left femoral endarterectomy for high-grade stenosis identified on recent angiogram.  Lifestyle-limiting short distance claudication in the left lower extremity.  I do think her profunda is chronically occluded based on arteriogram.  Risk and benefits discussed.  [name]  Office Note        CC:  follow up Requesting Provider:  Michael Boston, MD   HPI: Brenda Carney is a 71 y.o. ([birth year]) female who presents for surveillance of PAD.  Surgical history significant for right above-the-knee to below the knee popliteal bypass with vein by Dr. Vallarie Mare in 2018.  More recently she had right SFA laser atherectomy with balloon angioplasty including drug-coated balloon on 06/16/2020 due to recurrent claudication.  She reports ongoing claudication of the right leg as well as her left leg.  She denies any rest pain or tissue loss.  Only recently, left lower extremity claudication is feeling similar to her right leg symptoms.  She is on her aspirin and Plavix daily.  She has an occasional 1 to 2 cigarettes over the course of a week.         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   Cervical spondylosis     Chronic lower back pain     COPD (chronic obstructive pulmonary disease) (HCC)      "little bit" (05/14/2017)   Coronary artery disease, non-occlusive      Coronary calcium scoring: June 2018: Score 1106. 98 percentile for age -> LOW RISK MYOVIEW   Depression     Exercise-induced asthma with acute exacerbation      "only in very hot weather" (05/14/2017)   GERD (gastroesophageal reflux disease)     History of IBS      resovlved   Hx of multiple concussions      from horse training and riding   Hyperlipidemia with target LDL less than 70      Mostly statin intolerant,. Currently on Livalo 4 mg   Hypertension, benign     Lumbosacral spondylosis     PAD (peripheral artery disease) (HCC)      Status post right above-the-knee-below the knee popliteal artery bypass; postop right ABI 0.96.   Pneumonia 2020   Sleep apnea      does not wear CPAP; "think it was medication related" (05/14/2017)   Stroke [facility]) 2010    TIA   Thoracic aortic ectasia (HCC)      ~40 mm on Coronary Calcium Score CT - recommend f/u CTA or MRA   TIA (transient ischemic attack) 09/2011    "  several"           Past Surgical History:  Procedure Laterality Date   ABDOMINAL AORTOGRAM W/LOWER EXTREMITY N/A 05/14/2017    Procedure: Abdominal Aortogram w/Lower Extremity;  Surgeon: Conrad West Newton, MD;  Location: [place] [facility];  Service: Cardiovascular;  Laterality: N/A;   ACROMIO-CLAVICULAR JOINT REPAIR Left 1990s    "I think it was called an [facility] joint removal"   BACK SURGERY       BYPASS GRAFT POPLITEAL TO POPLITEAL Right 05/15/2017    Procedure: BYPASS GRAFT ABOVE KNEE POPLITEAL TO BELOW KNEE POPLITEAL ARTERY;  Surgeon: Conrad [place], MD;  Location: [place];  Service: Vascular;  Laterality: Right;   CARDIAC CATHETERIZATION       CORONARY STENT  INTERVENTION N/A 09/30/2021    Procedure: CORONARY STENT INTERVENTION;  Surgeon: Leonie Man, MD;  Location: [place] [facility];  Service: Cardiovascular;  Laterality: N/A;   ESOPHAGOGASTRODUODENOSCOPY (EGD) WITH PROPOFOL N/A 04/29/2018    Procedure: ESOPHAGOGASTRODUODENOSCOPY (EGD) WITH PROPOFOL;  Surgeon: Otis Brace, MD;  Location: [place];  Service: Gastroenterology;  Laterality: N/A;   FOOT FRACTURE SURGERY Bilateral      multiple procedures   FRACTURE SURGERY       INTRAVASCULAR ULTRASOUND/IVUS N/A 09/30/2021    Procedure: Intravascular Ultrasound/IVUS;  Surgeon: Leonie Man, MD;  Location: [place] [facility];  Service: Cardiovascular;  Laterality: N/A;   JOINT REPLACEMENT       LEFT HEART CATH AND CORONARY ANGIOGRAPHY N/A 09/30/2021    Procedure: LEFT HEART CATH AND CORONARY ANGIOGRAPHY;  Surgeon: Leonie Man, MD;  Location: [place] [facility];  Service: Cardiovascular;  Laterality: N/A;   LOWER EXTREMITY ANGIOGRAPHY N/A 06/16/2020    Procedure: LOWER EXTREMITY ANGIOGRAPHY;  Surgeon: [name], MD;  Location: [place] [facility];  Service: Cardiovascular;  Laterality: N/A;   MAXIMUM ACCESS (MAS)POSTERIOR LUMBAR INTERBODY FUSION (PLIF) 1 LEVEL N/A 09/30/2014    Procedure: FOR MAXIMUM ACCESS (MAS) POSTERIOR LUMBAR INTERBODY FUSION (PLIF) 1 LEVEL;  Surgeon: Eustace Moore, MD;  Location: [place] [facility];  Service: Neurosurgery;  Laterality: N/A;  FOR MAXIMUM ACCESS (MAS) POSTERIOR LUMBAR INTERBODY FUSION (PLIF) 1 LEVEL LUMBAR 4-5   MULTIPLE TOOTH EXTRACTIONS Right 04/2017   NM MYOVIEW LTD   06/2017     Normal EF, 66%. No ST changes. Normal study. LOW RISK.   PERIPHERAL VASCULAR ATHERECTOMY Left 06/16/2020    Procedure: PERIPHERAL VASCULAR ATHERECTOMY;  Surgeon: [name], MD;  Location: [place] [facility];  Service: Cardiovascular;  Laterality: Left;  SFA   TOTAL HIP ARTHROPLASTY Right 01/16/2020    Procedure: RIGHT TOTAL HIP ARTHROPLASTY ANTERIOR  APPROACH;  Surgeon: Mcarthur Rossetti, MD;  Location: [facility];  Service: Orthopedics;  Laterality: Right;   TOTAL HIP ARTHROPLASTY Left 04/30/2020    Procedure: LEFT TOTAL HIP ARTHROPLASTY ANTERIOR APPROACH;  Surgeon: Mcarthur Rossetti, MD;  Location: [facility];  Service: Orthopedics;  Laterality: Left;   TRANSTHORACIC ECHOCARDIOGRAM   09/07/2013    Done to evaluate TIA: Normal LV size and function. EF 55-60%. GR 1 DD. Mild left atrial dilation. Mildly calcified mitral leaflets. Otherwise normal.      Social History         Socioeconomic History   Marital status: Widowed      Spouse name: [name]    Number of children: 0   Years of education: COLLEGE   Highest education level: Some college, no degree  Occupational History   Occupation:  Horse Breeder   Tobacco Use   Smoking status: Some Days      Packs/day: 0.00      Years: 36.00      Total pack years: 0.00      Types: Cigarettes      Passive exposure: Never   Smokeless tobacco: Never   Tobacco comments:      [name] says she smokes one cigarette every couple of weeks. [name] says she has a lot of friends who still smoke  Vaping Use   Vaping Use: Former  Substance and Sexual Activity   Alcohol use: Yes      Alcohol/week: 3.0 standard drinks of alcohol      Types: 3 Glasses of wine per week   Drug use: No   Sexual activity: Not Currently  Other Topics Concern   Not on file  Social History Narrative    Patient lives Alone is a Widow Patient has no children.     Patient is a Medical illustrator.     Patient is right handed.     Patient has BA degree.     Smokes 1 cigarette every couple of weeks.    Social Determinants of Health    Financial Resource Strain: Not on file  Food Insecurity: Not on file  Transportation Needs: Not on file  Physical Activity: Not on file  Stress: Not on file  Social Connections: Not on file  Intimate Partner Violence: Not on file           Family History  Problem Relation Age of Onset    Stroke Father     Heart attack Mother     Stroke Mother              Current Outpatient Medications  Medication Sig Dispense Refill   albuterol (PROVENTIL) (2.5 MG/3ML) 0.083% nebulizer solution 3 ml as needed for wheezing or shortness of breath       albuterol (VENTOLIN HFA) 108 (90 Base) MCG/ACT inhaler 2 puffs as needed       amLODipine (NORVASC) 10 MG tablet 1 tablet       ANORO ELLIPTA 62.5-25 MCG/INH AEPB Inhale 2 puffs into the lungs daily as needed (wheezing).        Apoaequorin (PREVAGEN) 10 MG CAPS Take 10 mg by mouth daily.       ARIPiprazole (ABILIFY) 2 MG tablet 1 tablet       Ascorbic Acid (VITAMIN C) 1000 MG tablet See admin instructions.       aspirin 81 MG chewable tablet 1 tablet       BINAXNOW COVID-19 AG HOME TEST KIT See admin instructions.       bisoprolol (ZEBETA) 5 MG tablet Take 1 tablet (5 mg total) by mouth daily. 90 tablet 3   buPROPion (WELLBUTRIN SR) 150 MG 12 hr tablet 1 tablet in the morning       Calcium Carbonate-Vitamin D (CALTRATE 600+D PO)         Carboxymethylcellulose Sodium (THERATEARS) 0.25 % SOLN Place 1 drop into both eyes 4 (four) times daily as needed (dry eye).       clopidogrel (PLAVIX) 75 MG tablet Take 1 tablet by mouth daily. 90 tablet 2   desvenlafaxine (PRISTIQ) 100 MG 24 hr tablet 1 tablet       Estradiol 10 MCG TABS vaginal tablet Place 10 mcg vaginally once a week.        Evolocumab (REPATHA) 140 MG/ML SOSY Inject 1 ml  fluticasone (FLONASE) 50 MCG/ACT nasal spray 1 spray in each nostril       LAGEVRIO 200 MG CAPS capsule SMARTSIG:4 Capsule(s) By Mouth Every 12 Hours       lisinopril (ZESTRIL) 10 MG tablet Take 10 mg by mouth daily.       Multiple Vitamins-Minerals (CENTRUM SILVER 50+WOMEN) TABS Take by mouth.       naphazoline-pheniramine (VISINE) 0.025-0.3 % ophthalmic solution Place 1 drop into both eyes 4 (four) times daily as needed (Dry eye).       oxyCODONE (OXY IR/ROXICODONE) 5 MG immediate release tablet Take 10 mg by  mouth 4 (four) times daily.       Oxycodone HCl 10 MG TABS Take 10 mg by mouth in the morning, at noon, in the evening, and at bedtime.       prazosin (MINIPRESS) 1 MG capsule 1 capsule at bedtime       Propylene Glycol (SYSTANE COMPLETE) 0.6 % SOLN Place 1 drop into both eyes 4 (four) times daily as needed (Dry eye).       Propylene Glycol-Glycerin (SOOTHE) 0.6-0.6 % SOLN 1 drop into affected eye as needed       Propylhexedrine (BENZEDREX) INHA Place 1 each into the nose daily.       tiotropium (SPIRIVA HANDIHALER) 18 MCG inhalation capsule 1 capsule by inhaling the contents of the capsule using the HandiHaler device       tobramycin-dexamethasone (TOBRADEX) ophthalmic solution SMARTSIG:In Eye(s)       traZODone (DESYREL) 50 MG tablet 1 tablet at bedtime as needed       zolpidem (AMBIEN) 10 MG tablet Take 10 mg by mouth at bedtime.  (Patient not taking: Reported on 08/17/2022)       zolpidem (AMBIEN) 5 MG tablet 1 tablet at bedtime. (Patient not taking: Reported on 08/17/2022)        No current facility-administered medications for this visit.           Allergies  Allergen Reactions   Penicillins Swelling and Other (See Comments)      TONGUE SWELLING   PATIENT HAD A PCN REACTION WITH IMMEDIATE RASH, FACIAL/TONGUE/THROAT SWELLING, SOB, OR LIGHTHEADEDNESS WITH HYPOTENSION:  #  #  #  YES  #  #  #   Has patient had a PCN reaction causing severe rash involving mucus membranes or skin necrosis: No Has patient had a PCN reaction that required hospitalization: No Has patient had a PCN reaction occurring within the last 10 years: No     Nucynta Er [Tapentadol Hcl Er] Other (See Comments)   Statins        muscle pain   Statins Support [A-G Pro] Other (See Comments)   Lyrica [Pregabalin] Other (See Comments)      DRY MOUTH        REVIEW OF SYSTEMS:    _0  denotes positive finding, _1  denotes negative finding Cardiac   Comments:  Chest pain or chest pressure:      Shortness of breath upon  exertion:      Short of breath when lying flat:      Irregular heart rhythm:             Vascular      Pain in calf, thigh, or hip brought on by ambulation:      Pain in feet at night that wakes you up from your sleep:       Blood clot in your veins:  Leg swelling:              Pulmonary      Oxygen at home:      Productive cough:       Wheezing:              Neurologic      Sudden weakness in arms or legs:       Sudden numbness in arms or legs:       Sudden onset of difficulty speaking or slurred speech:      Temporary loss of vision in one eye:       Problems with dizziness:              Gastrointestinal      Blood in stool:       Vomited blood:              Genitourinary      Burning when urinating:       Blood in urine:             Psychiatric      Major depression:              Hematologic      Bleeding problems:      Problems with blood clotting too easily:             Skin      Rashes or ulcers:             Constitutional      Fever or chills:          PHYSICAL EXAMINATION:      Vitals:    08/17/22 1056  BP: 111/74  Pulse: 73  Resp: 20  Temp: 97.9 F (36.6 C)  TempSrc: Temporal  SpO2: 96%  Weight: 130 lb 3.2 oz (59.1 kg)  Height: _0  (1.651 m)      General:  WDWN in NAD; vital signs documented above Gait: Not observed HENT: WNL, normocephalic Pulmonary: normal non-labored breathing , without Rales, rhonchi,  wheezing Cardiac: regular HR Abdomen: soft, NT, no masses Skin: without rashes Vascular Exam/Pulses:   Right Left  DP absent absent  PT 1+ (weak) absent    Extremities: without ischemic changes, without Gangrene , without cellulitis; without open wounds;  Musculoskeletal: no muscle wasting or atrophy       Neurologic: A&O X 3;  No focal weakness or paresthesias are detected Psychiatric:  The pt has Normal affect.     Non-Invasive Vascular Imaging:   Right lower extremity arterial duplex demonstrates a widely patent above  to below the knee popliteal bypass graft with velocities in the 20s.  Velocities are similar to previous studies.     ABI/TBIToday's ABIToday's TBIPrevious ABIPrevious TBI  +-------+-----------+-----------+------------+------------+  Right  0.87       0.54       1.06        0.54          +-------+-----------+-----------+------------+------------+  Left   0.77       0.36       1.04        0.49          +-------+-----------+-----------+------------+------------+      ASSESSMENT/PLAN:: 71 y.o. female here for follow up for surveillance of PAD     -Subjectively claudication symptoms have worsened in the right lower extremity.  She has also developed claudication symptoms in the left lower extremity which she did not have previously.  She is  without rest pain or tissue loss of bilateral lower extremities.  Arterial duplex demonstrates a patent bypass graft however does have velocities in the 20s centimeters per second.  Velocities are consistent with prior studies however she also had a drop in ABI from 1.06-0.87.  She maintains bilateral femoral pulses.  She continues to take her aspirin and Plavix.  We discussed angiography given worsening symptoms.  I will discuss symptoms and imaging study results with Dr. Carlis Abbott however we will tentatively schedule her for angiography on 08/31/2022.  I will notify the patient if plans change.   ADDENDUM 08/18/22 Case was discussed with Dr. Carlis Abbott.  Given arterial duplex findings, drop in ABI, and worsening claudication symptoms we will proceed with angiography as planned on 08/31/22.   Dagoberto Ligas, PA-C [facility] [phone]   Clinic MD:   Scot Dock

## 2022-09-11 NOTE — Discharge Instructions (Signed)
 [facility] [place]  Discharge instructions  Lower Extremity Bypass Surgery  Please refer to the following instruction for your post-procedure care. Your surgeon or physician assistant will discuss any changes with you.  Activity  You are encouraged to walk as much as you can. You can slowly return to normal activities during the month after your surgery. Avoid strenuous activity and heavy lifting until your doctor tells you it's OK. Avoid activities such as vacuuming or swinging a golf club. Do not drive until your doctor give the OK and you are no longer taking prescription pain medications. It is also normal to have difficulty with sleep habits, eating and bowel movement after surgery. These will go away with time.  Bathing/Showering  Shower daily after you go home. Do not soak in a bathtub, hot tub, or swim until the incision heals completely.  Incision Care  Clean your incision with mild soap and water. Shower every day. Pat the area dry with a clean towel. You do not need a bandage unless otherwise instructed. Do not apply any ointments or creams to your incision. If you have open wounds you will be instructed how to care for them or a visiting nurse may be arranged for you. If you have staples or sutures along your incision they will be removed at your post-op appointment. You may have skin glue on your incision. Do not peel it off. It will come off on its own in about one week.  Wash the groin wound with soap and water daily and pat dry. (No tub bath-only shower)  Then put a dry gauze or washcloth in the groin to keep this area dry to help prevent wound infection.  Do this daily and as needed.  Do not use Vaseline or neosporin on your incisions.  Only use soap and water on your incisions and then protect and keep dry.  Diet  Resume your normal diet. There are no special food restrictions following this procedure. A low fat/ low cholesterol diet is  recommended for all patients with vascular disease. In order to heal from your surgery, it is CRITICAL to get adequate nutrition. Your body requires vitamins, minerals, and protein. Vegetables are the best source of vitamins and minerals. Vegetables also provide the perfect balance of protein. Processed food has little nutritional value, so try to avoid this.  Medications  Resume taking all your medications unless your doctor or physician assistant tells you not to. If your incision is causing pain, you may take over-the-counter pain relievers such as acetaminophen (Tylenol). If you were prescribed a stronger pain medication, please aware these medication can cause nausea and constipation. Prevent nausea by taking the medication with a snack or meal. Avoid constipation by drinking plenty of fluids and eating foods with high amount of fiber, such as fruits, vegetables, and grains. Take Colace 100 mg (an over-the-counter stool softener) twice a day as needed for constipation.  Do not take Tylenol if you are taking prescription pain medications.  Follow Up  Our office will schedule a follow up appointment 2-3 weeks following discharge.  Please call us immediately for any of the following conditions  Severe or worsening pain in your legs or feet while at rest or while walking Increase pain, redness, warmth, or drainage (pus) from your incision site(s) Fever of 101 degree or higher The swelling in your leg with the bypass suddenly worsens and becomes more painful than when you were in the hospital If you have   been instructed to feel your graft pulse then you should do so every day. If you can no longer feel this pulse, call the office immediately. Not all patients are given this instruction.  Leg swelling is common after leg bypass surgery.  The swelling should improve over a few months following surgery. To improve the swelling, you may elevate your legs above the level of your heart while you are  sitting or resting. Your surgeon or physician assistant may ask you to apply an ACE wrap or wear compression (TED) stockings to help to reduce swelling.  Reduce your risk of vascular disease  Stop smoking. If you would like help call [facility] at 1-800-QUIT-NOW (1-800-784-8669) or [place] at 336-586-4000.  Manage your cholesterol Maintain a desired weight Control your diabetes weight Control your diabetes Keep your blood pressure down  If you have any questions, please call the office at [phone]  

## 2022-09-12 ENCOUNTER — Encounter (HOSPITAL_COMMUNITY): Payer: Self-pay | Admitting: Vascular Surgery

## 2022-09-12 ENCOUNTER — Other Ambulatory Visit (HOSPITAL_COMMUNITY): Payer: Self-pay

## 2022-09-12 ENCOUNTER — Encounter: Payer: Self-pay | Admitting: Pulmonary Disease

## 2022-09-12 LAB — CBC
HCT: 30.6 % — ABNORMAL LOW (ref 36.0–46.0)
Hemoglobin: 10.7 g/dL — ABNORMAL LOW (ref 12.0–15.0)
MCH: 34.4 pg — ABNORMAL HIGH (ref 26.0–34.0)
MCHC: 35 g/dL (ref 30.0–36.0)
MCV: 98.4 fL (ref 80.0–100.0)
Platelets: 387 10*3/uL (ref 150–400)
RBC: 3.11 MIL/uL — ABNORMAL LOW (ref 3.87–5.11)
RDW: 12.8 % (ref 11.5–15.5)
WBC: 11.1 10*3/uL — ABNORMAL HIGH (ref 4.0–10.5)
nRBC: 0 % (ref 0.0–0.2)

## 2022-09-12 LAB — BASIC METABOLIC PANEL
Anion gap: 7 (ref 5–15)
BUN: 6 mg/dL — ABNORMAL LOW (ref 8–23)
CO2: 23 mmol/L (ref 22–32)
Calcium: 7 mg/dL — ABNORMAL LOW (ref 8.9–10.3)
Chloride: 105 mmol/L (ref 98–111)
Creatinine, Ser: 0.74 mg/dL (ref 0.44–1.00)
GFR, Estimated: >60 mL/min (ref >60)
Glucose, Bld: 111 mg/dL — ABNORMAL HIGH (ref 70–99)
Potassium: 2.9 mmol/L — ABNORMAL LOW (ref 3.5–5.1)
Sodium: 135 mmol/L (ref 135–145)

## 2022-09-12 LAB — LIPID PANEL
Cholesterol: 108 mg/dL (ref 0–200)
HDL: 44 mg/dL (ref >40)
LDL Cholesterol: 36 mg/dL (ref 0–99)
Total CHOL/HDL Ratio: 2.5 RATIO
Triglycerides: 140 mg/dL (ref <150)
VLDL: 28 mg/dL (ref 0–40)

## 2022-09-12 MED ORDER — POTASSIUM CHLORIDE CRYS ER 20 MEQ PO TBCR
20.0000 meq | EXTENDED_RELEASE_TABLET | Freq: Every day | ORAL | Status: AC | PRN
  Administered 2022-09-12: 20 meq via ORAL
  Filled 2022-09-12: qty 1

## 2022-09-12 MED ORDER — OXYCODONE HCL 10 MG PO TABS: 10.0000 mg | ORAL_TABLET | Freq: Four times a day (QID) | ORAL | 0 refills | Status: DC

## 2022-09-12 MED ORDER — OXYCODONE HCL 10 MG PO TABS
10.0000 mg | ORAL_TABLET | Freq: Four times a day (QID) | ORAL | 0 refills | Status: DC
  Filled 2022-09-12 (×2): qty 20, 5d supply, fill #0

## 2022-09-12 NOTE — Plan of Care (Signed)
  Problem: Cardiovascular: Goal: Vascular access site(s) Level 0-1 will be maintained Outcome: Progressing   

## 2022-09-12 NOTE — Progress Notes (Signed)
[facility]  Subjective  -no complaints.  Wants to go home.   Objective (!) 156/79 91 98.3 F (36.8 C) (Oral) 19 96%  Intake/Output Summary (Last 24 hours) at 09/12/2022 0649 Last data filed at 09/12/2022 0615 Gross per 24 hour  Intake 3072.5 ml  Output 1825 ml  Net 1247.5 ml    Left groin incision clean dry and intact with some ecchymosis Left DP and PT brisk by Doppler  Laboratory Lab Results: Recent Labs    09/11/22 0807 09/12/22 0340  WBC 9.9 11.1*  HGB 14.0 10.7*  HCT 40.1 30.6*  PLT 449* 387   BMET Recent Labs    09/11/22 0807 09/12/22 0340  NA 139 135  K 3.8 2.9*  CL 106 105  CO2 21* 23  GLUCOSE 85 111*  BUN 8 6*  CREATININE 0.81 0.74  CALCIUM 8.5* 7.0*    COAG Lab Results  Component Value Date   INR 1.0 09/11/2022   INR 1.02 04/28/2018   INR 1.02 05/15/2017   No results found for: "PTT"  Assessment/Planning:  Postop day 1 status post extensive endarterectomy of the left common femoral artery including the profunda with a second patch on the SFA for lifestyle limiting claudication with a high-grade stenosis.  Brisk Doppler signals in the left foot.  Groin looks good.  Aspirin Plavix for right leg stents.  Wants to go home today and will check on her later this morning.  Discussed follow-up in 2 weeks if she leaves.  Replaced her potassium.  Out of bed and mobilize.  [name] 09/12/2022 6:49 AM --

## 2022-09-12 NOTE — Progress Notes (Signed)
A-line removed per order

## 2022-09-12 NOTE — Progress Notes (Signed)
Patient given discharge instructions and stated understanding.  Patient stated she needs her pain medication filled through [facility].

## 2022-09-12 NOTE — Progress Notes (Signed)
Mobility Specialist Progress Note:   09/12/22 1158  Mobility  Activity Ambulated with assistance in hallway  Level of Assistance Modified independent, requires aide device or extra time  Assistive Device Front wheel walker  Distance Ambulated (ft) 200 ft  Activity Response Tolerated well  $Mobility charge 1 Mobility   Pt received in bed willing to participate in mobility. Complaints of 6/10 pain in L groin. Left in bed with call bell in reach and all needs met.   [facility] Surveyor, mining Chat only

## 2022-09-12 NOTE — Evaluation (Signed)
Physical Therapy Evaluation & Discharge Patient Details Name: Brenda Carney MRN: [id number] DOB: 03/24/1951 Today's Date: 09/12/2022  History of Present Illness  Pt is a 71 y.o. female admitted 09/11/22 for same day extensive L common femoral artery endarterectomy. PMH includes PAD, HTN, stroke, COPD, CAD, cerebral aneurysm, arthritis, multiple concussions, OSA, bilateral THA, lumbar fusion.   Clinical Impression  Patient evaluated by Physical Therapy with no further acute PT needs identified. PTA, pt independent without DME, lives alone with dogs, has friends available for PRN assist. Today, pt moving well mod indep, opting to use RW to offload painful LLE. All education has been completed and the patient has no further questions. Acute PT is signing off. Thank you for this referral.     HR 103 with ambulation   Recommendations for follow up therapy are one component of a multi-disciplinary discharge planning process, led by the attending physician.  Recommendations may be updated based on patient status, additional functional criteria and insurance authorization.  Follow Up Recommendations No PT follow up      Assistance Recommended at Discharge PRN  Patient can return home with the following  Assistance with cooking/housework;Assist for transportation    Equipment Recommendations None recommended by PT  Recommendations for Other Services   Mobility Specialist   Functional Status Assessment       Precautions / Restrictions Precautions Precautions: Fall Restrictions Weight Bearing Restrictions: No      Mobility  Bed Mobility Overal bed mobility: Modified Independent             General bed mobility comments: increased time secondary to LLE pain    Transfers Overall transfer level: Modified independent Equipment used: Rolling walker (2 wheels)               General transfer comment: use of momentum to power up    Ambulation/Gait Ambulation/Gait  assistance: Modified independent (Device/Increase time) Gait Distance (Feet): 160 Feet Assistive device: Rolling walker (2 wheels) Gait Pattern/deviations: Step-through pattern, Decreased stride length, Trunk flexed, Decreased weight shift to left, Antalgic Gait velocity: Decreased     General Gait Details: slow, antalgic gait mod indep with RW; pt opting to use RW secondary to significant LLE pain, declines further distance due to pain  Stairs Stairs:  (pt declined formal stair training, educ on technique with step-to pattern and compensation for LLE pain)          Wheelchair Mobility    Modified Rankin (Stroke Patients Only)       Balance Overall balance assessment: Needs assistance   Sitting balance-Leahy Scale: Good Sitting balance - Comments: mod indep with toileting     Standing balance-Leahy Scale: Fair Standing balance comment: can static stand without UE support, preference for RW to offload painful LLE                             Pertinent Vitals/Pain Pain Assessment Pain Assessment: Faces Faces Pain Scale: Hurts even more Pain Location: LLE Pain Descriptors / Indicators: Discomfort, Grimacing, Guarding Pain Intervention(s): Monitored during session, Limited activity within patient's tolerance    Home Living Family/patient expects to be discharged to:: Private residence Living Arrangements: Alone Available Help at Discharge: Friend(s);Available PRN/intermittently Type of Home: House Home Access: Stairs to enter Entrance Stairs-Rails: Right Entrance Stairs-Number of Steps: 6   Home Layout: Two level;Laundry or work area in basement;Able to live on main level with bedroom/bathroom Home Equipment: Conservation officer, nature (2 wheels);Cane -  single point Additional Comments: friends available to assist PRN, but pt hopeful to do things herself    Prior Function Prior Level of Function : Independent/Modified Independent;Driving             Mobility  Comments: independent without DME, has 2 dogs she cares for, enjoys horse riding and training       Hand Dominance        Extremity/Trunk Assessment   Upper Extremity Assessment Upper Extremity Assessment: Overall WFL for tasks assessed (h/o L TSA)    Lower Extremity Assessment Lower Extremity Assessment: LLE deficits/detail LLE Deficits / Details: s/p LLE endarterectomy with expected post-op pain and weakness, functionally moving LLE well with strength >3/5       Communication   Communication: No difficulties  Cognition Arousal/Alertness: Awake/alert Behavior During Therapy: WFL for tasks assessed/performed Overall Cognitive Status: Within Functional Limits for tasks assessed                                          General Comments General comments (skin integrity, edema, etc.): educ re: precautions, positioning, activity recommendations, edema control, DVT prevention, DME needs    Exercises     Assessment/Plan    PT Assessment Patient does not need any further PT services  PT Problem List         PT Treatment Interventions      PT Goals (Current goals can be found in the Care Plan section)  Acute Rehab PT Goals PT Goal Formulation: All assessment and education complete, DC therapy    Frequency       Co-evaluation               AM-PAC PT "6 Clicks" Mobility  Outcome Measure Help needed turning from your back to your side while in a flat bed without using bedrails?: None Help needed moving from lying on your back to sitting on the side of a flat bed without using bedrails?: None Help needed moving to and from a bed to a chair (including a wheelchair)?: None Help needed standing up from a chair using your arms (e.g., wheelchair or bedside chair)?: None Help needed to walk in hospital room?: None Help needed climbing 3-5 steps with a railing? : A Little 6 Click Score: 23    End of Session   Activity Tolerance: Patient tolerated  treatment well Patient left: in bed;with call bell/phone within reach Nurse Communication: Mobility status PT Visit Diagnosis: Other abnormalities of gait and mobility (R26.89);Pain Pain - Right/Left: Left Pain - part of body: Leg    Time: 3662-9476 PT Time Calculation (min) (ACUTE ONLY): 17 min   Charges:   PT Evaluation $PT Eval Low Complexity: 1 Low         Mabeline Caras, PT, DPT Acute Rehabilitation Services  Personal: [facility] Office: [place] 09/12/2022, 8:19 AM

## 2022-09-12 NOTE — Progress Notes (Signed)
PHARMACIST LIPID MONITORING   Brenda Carney is a 71 y.o. female admitted on 09/11/2022 for L femoral endarterectomy and angioplasty.  Pharmacy has been consulted to optimize lipid-lowering therapy with the indication of secondary prevention for clinical ASCVD.  Recent Labs:  Lipid Panel (last 6 months):   Lab Results  Component Value Date   CHOL 108 09/12/2022   TRIG 140 09/12/2022   HDL 44 09/12/2022   CHOLHDL 2.5 09/12/2022   VLDL 28 09/12/2022   LDLCALC 36 09/12/2022    Hepatic function panel (last 6 months):   Lab Results  Component Value Date   AST 25 09/11/2022   ALT 11 09/11/2022   ALKPHOS 61 09/11/2022   BILITOT 0.6 09/11/2022    SCr (since admission):   Serum creatinine: 0.74 mg/dL 09/12/22 0340 Estimated creatinine clearance: 58 mL/min  Current therapy and lipid therapy tolerance Current lipid-lowering therapy: Repatha 140 mg SQ every 14 days Previous lipid-lowering therapies (if applicable): statins, fibrate, zetia Documented or reported allergies or intolerances to lipid-lowering therapies:  - on Repatha since 2018.  Per Lipid Clinic/ [facility] notes, had muscle and joint aches with atorvastatin, rosuvastatin, simvastatin, pitavastatin, fenofibrate and zetia. Pain resolved when meds stopped.  Assessment:   LDL at goal.  Plan:   No changes, continue Repatha.  Arty Baumgartner, [place] 09/12/2022, 9:36 AM

## 2022-09-12 NOTE — Progress Notes (Signed)
OT Cancellation Note  Patient Details Name: Brenda Carney MRN: [id number] DOB: [birth year]   Cancelled Treatment:    Reason Eval/Treat Not Completed: OT screened, no needs identified, will sign off (per discussion with PT, pt mod I for mobility, able to reach BLE"s for LB dressing, will have help up on d/c. Pt has no acute OT needs at this time, will s/o. Please reconsult if there is a change in pt status)  Lynnda Child, OTD, OTR/L Acute Rehab [phone] - [place] 09/12/2022, 8:32 AM

## 2022-09-12 NOTE — Progress Notes (Signed)
Patient uses a Pain Clinic and [facility] can not fill her prescription for pain medication. Charge nurse Ronalee Belts called Dr Carlis Abbott and he is changing the pharmacy to [facility].

## 2022-09-13 DIAGNOSIS — F17210 Nicotine dependence, cigarettes, uncomplicated: Secondary | ICD-10-CM | POA: Diagnosis not present

## 2022-09-13 DIAGNOSIS — J449 Chronic obstructive pulmonary disease, unspecified: Secondary | ICD-10-CM | POA: Diagnosis not present

## 2022-09-13 DIAGNOSIS — I739 Peripheral vascular disease, unspecified: Secondary | ICD-10-CM | POA: Diagnosis not present

## 2022-09-13 DIAGNOSIS — M5459 Other low back pain: Secondary | ICD-10-CM | POA: Diagnosis not present

## 2022-09-13 DIAGNOSIS — G4730 Sleep apnea, unspecified: Secondary | ICD-10-CM | POA: Diagnosis not present

## 2022-09-13 DIAGNOSIS — M1611 Unilateral primary osteoarthritis, right hip: Secondary | ICD-10-CM | POA: Diagnosis not present

## 2022-09-13 DIAGNOSIS — Z8673 Personal history of transient ischemic attack (TIA), and cerebral infarction without residual deficits: Secondary | ICD-10-CM | POA: Diagnosis not present

## 2022-09-13 DIAGNOSIS — Z7902 Long term (current) use of antithrombotics/antiplatelets: Secondary | ICD-10-CM | POA: Diagnosis not present

## 2022-09-13 DIAGNOSIS — I119 Hypertensive heart disease without heart failure: Secondary | ICD-10-CM | POA: Diagnosis not present

## 2022-09-13 DIAGNOSIS — M47897 Other spondylosis, lumbosacral region: Secondary | ICD-10-CM | POA: Diagnosis not present

## 2022-09-13 DIAGNOSIS — M19241 Secondary osteoarthritis, right hand: Secondary | ICD-10-CM | POA: Diagnosis not present

## 2022-09-13 DIAGNOSIS — Z48812 Encounter for surgical aftercare following surgery on the circulatory system: Secondary | ICD-10-CM | POA: Diagnosis not present

## 2022-09-13 DIAGNOSIS — I77810 Thoracic aortic ectasia: Secondary | ICD-10-CM | POA: Diagnosis not present

## 2022-09-13 DIAGNOSIS — E785 Hyperlipidemia, unspecified: Secondary | ICD-10-CM | POA: Diagnosis not present

## 2022-09-13 DIAGNOSIS — M4712 Other spondylosis with myelopathy, cervical region: Secondary | ICD-10-CM | POA: Diagnosis not present

## 2022-09-13 DIAGNOSIS — F32A Depression, unspecified: Secondary | ICD-10-CM | POA: Diagnosis not present

## 2022-09-13 DIAGNOSIS — M19242 Secondary osteoarthritis, left hand: Secondary | ICD-10-CM | POA: Diagnosis not present

## 2022-09-13 DIAGNOSIS — M1612 Unilateral primary osteoarthritis, left hip: Secondary | ICD-10-CM | POA: Diagnosis not present

## 2022-09-13 DIAGNOSIS — K219 Gastro-esophageal reflux disease without esophagitis: Secondary | ICD-10-CM | POA: Diagnosis not present

## 2022-09-13 DIAGNOSIS — I2510 Atherosclerotic heart disease of native coronary artery without angina pectoris: Secondary | ICD-10-CM | POA: Diagnosis not present

## 2022-09-13 DIAGNOSIS — Z7982 Long term (current) use of aspirin: Secondary | ICD-10-CM | POA: Diagnosis not present

## 2022-09-14 NOTE — Discharge Summary (Signed)
Bypass Discharge Summary Patient ID: Brenda Carney [id number] 71 y.o. [name] 03, 1951  Admit date: 09/11/2022  Discharge date and time: 09/12/2022  3:54 PM   Admitting Physician: [name], MD   Discharge Physician: Monica Martinez, MD  Admission Diagnoses: Peripheral arterial disease [facility]) [I73.9]  Discharge Diagnoses: Peripheral arterial disease ([place]) [I73.9]  Admission Condition: fair  Discharged Condition: good  Indication for Admission: 71 year old female well-known to vascular surgery that presents with short distance lifestyle limiting claudication in the left lower extremity. Recent arteriogram showed heavily calcified high-grade stenosis in the left common femoral artery >80% with an occluded profunda and disease in the proximal SFA.  She presents today for left femoral endarterectomy with patch angioplasty after risks benefits discussed.  An assistant was needed for exposure and to expedite the case.   Hospital Course: Mrs. Purkey was admitted on 09/11/22 and underwent left common femoral endarterectomy with profundoplasty and bovine pericardial patch angioplasty, left SFA endarterectomy with bovine pericardial patch angioplasty by Dr. Carlis Abbott. She tolerated the procedure well and was taken to the recovery room in stable condition.   By POD#1, she did well overnight. Pain well controlled. Left groin incision is clean, dry and intact with mild ecchymosis. Her left DP and PT had brisk doppler signals. She remained hemodynamically stable. Tolerated mobilization and cleared by PT/OT. She remained stable for discharge home. She will continue home medications as prescribed. She will continue Aspirin and Plavix. She has follow up arranged in 2 weeks for incision check with Dr. Carlis Abbott.    Consults: None  Treatments: antibiotics: vancomycin, analgesia: acetaminophen, and surgery: 1.  Left common femoral endarterectomy with profundoplasty and bovine pericardial patch angioplasty 2.   Left SFA endarterectomy with bovine pericardial patch angioplasty.     Disposition: Discharge disposition: 01-Home or Self Care       - For [facility] use ---  Post-op:  Wound infection: No  Graft infection: No  Transfusion: No  If yes, 0 units given New Arrhythmia: No Patency judged by: '[X]'  Dopper only, '[ ]'  Palpable graft pulse, '[ ]'  Palpable distal pulse, '[ ]'  ABI inc. > 0.15, '[ ]'  Duplex D/C Ambulatory Status: Ambulatory  Complications: MI: Valu.Nieves ] No, '[ ]'  Troponin only, '[ ]'  EKG or Clinical CHF: No Resp failure: Valu.Nieves ] none, '[ ]'  Pneumonia, '[ ]'  Ventilator Chg in renal function: Valu.Nieves ] none, '[ ]'  Inc. Cr > 0.5, '[ ]'  Temp. Dialysis, '[ ]'  Permanent dialysis Stroke: Valu.Nieves ] None, '[ ]'  Minor, '[ ]'  Major Return to OR: No  Reason for return to OR: '[ ]'  Bleeding, '[ ]'  Infection, '[ ]'  Thrombosis, '[ ]'  Revision  Discharge medications: Statin use:  No  for medical reason does not tolerate ASA use:  Yes Plavix use:  Yes Beta blocker use: Yes Coumadin use: No  for medical reason not indicated    Patient Instructions:  Allergies as of 09/12/2022       Reactions   Penicillins Swelling, Other (See Comments)   TONGUE SWELLING PATIENT HAD A PCN REACTION WITH IMMEDIATE RASH, FACIAL/TONGUE/THROAT SWELLING, SOB, OR LIGHTHEADEDNESS WITH HYPOTENSION:  #  #  #  YES  #  #  #   Has patient had a PCN reaction causing severe rash involving mucus membranes or skin necrosis: No Has patient had a PCN reaction that required hospitalization: No Has patient had a PCN reaction occurring within the last 10 years: No   Nucynta Er [tapentadol Hcl Er] Other (See Comments)   Unknown  Statins    muscle pain   Statins Support [a-g Pro] Other (See Comments)   Lyrica [pregabalin] Other (See Comments)   DRY MOUTH        Medication List     TAKE these medications    albuterol 108 (90 Base) MCG/ACT inhaler Commonly known as: VENTOLIN HFA Inhale 2 puffs into the lungs daily as needed for shortness of breath.    amLODipine 10 MG tablet Commonly known as: NORVASC 1 tablet   ARIPiprazole 2 MG tablet Commonly known as: ABILIFY Take 2 mg by mouth every morning.   aspirin 81 MG chewable tablet 1 tablet   BENZEDREX NA Place 1 spray into the nose 2 (two) times daily.   BinaxNOW COVID-19 Ag Home Test Kit Generic drug: COVID-19 At Home Antigen Test See admin instructions.   bisoprolol 5 MG tablet Commonly known as: ZEBETA Take 1 tablet (5 mg total) by mouth daily.   buPROPion 150 MG 24 hr tablet Commonly known as: WELLBUTRIN XL Take 150 mg by mouth every morning.   CALTRATE 600+D PO Take 1 tablet by mouth daily.   Centrum Silver 50+Women Tabs Take 1 tablet by mouth daily.   clopidogrel 75 MG tablet Commonly known as: PLAVIX Take 1 tablet by mouth daily.   desvenlafaxine 100 MG 24 hr tablet Commonly known as: PRISTIQ Take 100 mg by mouth daily.   diazepam 10 MG tablet Commonly known as: VALIUM Take 5 mg by mouth at bedtime.   Estradiol 10 MCG Tabs vaginal tablet Place 10 mcg vaginally once a week.   lisinopril 10 MG tablet Commonly known as: ZESTRIL Take 10 mg by mouth daily.   Lumify 0.025 % Soln Generic drug: Brimonidine Tartrate Place 1 drop into both eyes daily.   Oxycodone HCl 10 MG Tabs Take 1 tablet (10 mg total) by mouth 4 (four) times daily.   prazosin 1 MG capsule Commonly known as: MINIPRESS Take 1 mg by mouth at bedtime.   Prevagen 10 MG Caps Generic drug: Apoaequorin Take 10 mg by mouth daily.   Repatha 140 MG/ML Sosy Generic drug: Evolocumab Inject 140 mg into the skin every 14 (fourteen) days.   Spiriva HandiHaler 18 MCG inhalation capsule Generic drug: tiotropium Place 18 mcg into inhaler and inhale daily as needed (Shortness of breath).   traZODone 50 MG tablet Commonly known as: DESYREL Take 50 mg by mouth at bedtime.   Visine 0.025-0.3 % ophthalmic solution Generic drug: naphazoline-pheniramine Place 1 drop into both eyes 4 (four)  times daily as needed (Dry eye).   vitamin C 1000 MG tablet Take 1,000 mg by mouth 2 (two) times a week.       Activity: activity as tolerated, no driving while on analgesics, and no heavy lifting for 6 weeks Diet: low fat, low cholesterol diet Wound Care:  Wash groin incision daily with mild soap and water, pat dry do no soak in bathtub  Follow-up with Dr. Carlis Abbott in 2 weeks.  Signed: Karoline Caldwell 09/14/2022 11:39 AM

## 2022-09-15 DIAGNOSIS — I2510 Atherosclerotic heart disease of native coronary artery without angina pectoris: Secondary | ICD-10-CM | POA: Diagnosis not present

## 2022-09-15 DIAGNOSIS — M19242 Secondary osteoarthritis, left hand: Secondary | ICD-10-CM | POA: Diagnosis not present

## 2022-09-15 DIAGNOSIS — I739 Peripheral vascular disease, unspecified: Secondary | ICD-10-CM | POA: Diagnosis not present

## 2022-09-15 DIAGNOSIS — I119 Hypertensive heart disease without heart failure: Secondary | ICD-10-CM | POA: Diagnosis not present

## 2022-09-15 DIAGNOSIS — M19241 Secondary osteoarthritis, right hand: Secondary | ICD-10-CM | POA: Diagnosis not present

## 2022-09-15 DIAGNOSIS — Z48812 Encounter for surgical aftercare following surgery on the circulatory system: Secondary | ICD-10-CM | POA: Diagnosis not present

## 2022-09-18 DIAGNOSIS — I119 Hypertensive heart disease without heart failure: Secondary | ICD-10-CM | POA: Diagnosis not present

## 2022-09-18 DIAGNOSIS — M19242 Secondary osteoarthritis, left hand: Secondary | ICD-10-CM | POA: Diagnosis not present

## 2022-09-18 DIAGNOSIS — M19241 Secondary osteoarthritis, right hand: Secondary | ICD-10-CM | POA: Diagnosis not present

## 2022-09-18 DIAGNOSIS — I739 Peripheral vascular disease, unspecified: Secondary | ICD-10-CM | POA: Diagnosis not present

## 2022-09-18 DIAGNOSIS — I2510 Atherosclerotic heart disease of native coronary artery without angina pectoris: Secondary | ICD-10-CM | POA: Diagnosis not present

## 2022-09-18 DIAGNOSIS — Z48812 Encounter for surgical aftercare following surgery on the circulatory system: Secondary | ICD-10-CM | POA: Diagnosis not present

## 2022-09-19 ENCOUNTER — Other Ambulatory Visit (HOSPITAL_COMMUNITY): Payer: Self-pay

## 2022-09-20 DIAGNOSIS — M19241 Secondary osteoarthritis, right hand: Secondary | ICD-10-CM | POA: Diagnosis not present

## 2022-09-20 DIAGNOSIS — Z48812 Encounter for surgical aftercare following surgery on the circulatory system: Secondary | ICD-10-CM | POA: Diagnosis not present

## 2022-09-20 DIAGNOSIS — I2510 Atherosclerotic heart disease of native coronary artery without angina pectoris: Secondary | ICD-10-CM | POA: Diagnosis not present

## 2022-09-20 DIAGNOSIS — M19242 Secondary osteoarthritis, left hand: Secondary | ICD-10-CM | POA: Diagnosis not present

## 2022-09-20 DIAGNOSIS — I739 Peripheral vascular disease, unspecified: Secondary | ICD-10-CM | POA: Diagnosis not present

## 2022-09-20 DIAGNOSIS — I119 Hypertensive heart disease without heart failure: Secondary | ICD-10-CM | POA: Diagnosis not present

## 2022-09-22 DIAGNOSIS — M19241 Secondary osteoarthritis, right hand: Secondary | ICD-10-CM | POA: Diagnosis not present

## 2022-09-22 DIAGNOSIS — M19242 Secondary osteoarthritis, left hand: Secondary | ICD-10-CM | POA: Diagnosis not present

## 2022-09-22 DIAGNOSIS — I119 Hypertensive heart disease without heart failure: Secondary | ICD-10-CM | POA: Diagnosis not present

## 2022-09-22 DIAGNOSIS — I739 Peripheral vascular disease, unspecified: Secondary | ICD-10-CM | POA: Diagnosis not present

## 2022-09-22 DIAGNOSIS — Z48812 Encounter for surgical aftercare following surgery on the circulatory system: Secondary | ICD-10-CM | POA: Diagnosis not present

## 2022-09-22 DIAGNOSIS — I2510 Atherosclerotic heart disease of native coronary artery without angina pectoris: Secondary | ICD-10-CM | POA: Diagnosis not present

## 2022-09-25 ENCOUNTER — Other Ambulatory Visit: Payer: Self-pay | Admitting: Cardiology

## 2022-09-25 DIAGNOSIS — I739 Peripheral vascular disease, unspecified: Secondary | ICD-10-CM | POA: Diagnosis not present

## 2022-09-25 DIAGNOSIS — M19242 Secondary osteoarthritis, left hand: Secondary | ICD-10-CM | POA: Diagnosis not present

## 2022-09-25 DIAGNOSIS — Z48812 Encounter for surgical aftercare following surgery on the circulatory system: Secondary | ICD-10-CM | POA: Diagnosis not present

## 2022-09-25 DIAGNOSIS — M19241 Secondary osteoarthritis, right hand: Secondary | ICD-10-CM | POA: Diagnosis not present

## 2022-09-25 DIAGNOSIS — I119 Hypertensive heart disease without heart failure: Secondary | ICD-10-CM | POA: Diagnosis not present

## 2022-09-25 DIAGNOSIS — I2510 Atherosclerotic heart disease of native coronary artery without angina pectoris: Secondary | ICD-10-CM | POA: Diagnosis not present

## 2022-09-26 ENCOUNTER — Encounter: Payer: Self-pay | Admitting: Vascular Surgery

## 2022-09-26 ENCOUNTER — Ambulatory Visit (INDEPENDENT_AMBULATORY_CARE_PROVIDER_SITE_OTHER): Payer: Medicare Other | Admitting: Vascular Surgery

## 2022-09-26 VITALS — BP 100/66 | HR 118 | Temp 97.4°F | Resp 14 | Ht 65.0 in | Wt 128.0 lb

## 2022-09-26 DIAGNOSIS — I70229 Atherosclerosis of native arteries of extremities with rest pain, unspecified extremity: Secondary | ICD-10-CM

## 2022-09-26 DIAGNOSIS — I739 Peripheral vascular disease, unspecified: Secondary | ICD-10-CM

## 2022-09-26 NOTE — Progress Notes (Signed)
Patient name: Brenda Carney MRN: [id number] DOB: Dec 06, 1951 Sex: female  REASON FOR VISIT: Post-op check  HPI: Brenda Carney is a 71 y.o. female with multiple medical problems as noted below that presents for postop check.  She most recently underwent left common femoral endarterectomy with a separate left SFA endarterectomy with bovine patch on 09/11/2022 for lifestyle-limiting claudication.  Her groin is healing without issues.  Her calf burning is better.  She also underwent a right SFA angioplasty with stent placement x2 on 08/31/2022 for recurrent high-grade stenosis in the SFA proximal to a remote popliteal bypass performed by Dr. Bridgett Larsson.  In the past she underwent right SFA laser atherectomy with balloon angioplasty including drug-coated balloon on 06/16/2020.  This was for recurrent claudication in the setting of previous right above-knee to below-knee pop bypass with vein by Dr. Bridgett Larsson in 2018.  On arteriogram she was found to have an occluded SFA proximal to the bypass and the bypass remained patent.    Past Medical History:  Diagnosis Date   Arthritis    "thumbs; hips" (05/14/2017)   Cerebral aneurysm    Right ophthalmic artery, left callosal marginal anterior cerebral artery branch   Cerebrovascular disease    Carotid dopplers which showed no significant increase in velocities, extremely minimal on the left, antegrade vertebral bilaterally.   Cervical spondylosis    Chronic lower back pain    COPD (chronic obstructive pulmonary disease) (HCC)    "little bit" (05/14/2017)   Coronary artery disease, non-occlusive    Coronary calcium scoring: June 2018: Score 1106. 98 percentile for age -> LOW RISK MYOVIEW   Depression    Exercise-induced asthma with acute exacerbation    "only in very hot weather" (05/14/2017)   GERD (gastroesophageal reflux disease)    History of IBS    resovlved   Hx of multiple concussions    from horse training and riding   Hyperlipidemia with target LDL less  than 70    Mostly statin intolerant,. 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   Pneumonia 2020   Sleep apnea    does not wear CPAP; "think it was medication related" (05/14/2017)   Stroke [facility]) 2010   TIA   Thoracic aortic ectasia (HCC)    ~40 mm on Coronary Calcium Score CT - recommend f/u CTA or MRA   TIA (transient ischemic attack) 09/2011   "several"    Past Surgical History:  Procedure Laterality Date   ABDOMINAL AORTOGRAM W/LOWER EXTREMITY N/A 05/14/2017   Procedure: Abdominal Aortogram w/Lower Extremity;  Surgeon: Conrad Cecil, MD;  Location: [place] [facility];  Service: Cardiovascular;  Laterality: N/A;   ABDOMINAL AORTOGRAM W/LOWER EXTREMITY N/A 08/31/2022   Procedure: ABDOMINAL AORTOGRAM W/LOWER EXTREMITY;  Surgeon: [name], MD;  Location: [place] [facility];  Service: Cardiovascular;  Laterality: N/A;   ACROMIO-CLAVICULAR JOINT REPAIR Left 1990s   "I think it was called an [facility] joint removal"   BACK SURGERY     BYPASS GRAFT POPLITEAL TO POPLITEAL Right 05/15/2017   Procedure: BYPASS GRAFT ABOVE KNEE POPLITEAL TO BELOW KNEE POPLITEAL ARTERY;  Surgeon: Conrad [place], MD;  Location: [place];  Service: Vascular;  Laterality: Right;   CARDIAC CATHETERIZATION     CORONARY STENT INTERVENTION N/A 09/30/2021   Procedure: CORONARY STENT INTERVENTION;  Surgeon: Leonie Man, MD;  Location: [place] [facility];  Service: Cardiovascular;  Laterality: N/A;   ENDARTERECTOMY FEMORAL Left 09/11/2022   Procedure: LEFT COMMON FEMORAL ENDARTERECTOMY;  Surgeon: [name], MD;  Location[facility];  Service: Vascular;  Laterality: Left;   ESOPHAGOGASTRODUODENOSCOPY (EGD) WITH PROPOFOL N/A 04/29/2018   Procedure: ESOPHAGOGASTRODUODENOSCOPY (EGD) WITH PROPOFOL;  Surgeon: Otis Brace, MD;  Location: [place];  Service: Gastroenterology;  Laterality: N/A;   FOOT FRACTURE SURGERY Bilateral    multiple procedures   FRACTURE SURGERY     INTRAVASCULAR ULTRASOUND/IVUS N/A 09/30/2021   Procedure: Intravascular Ultrasound/IVUS;  Surgeon: Leonie Man, MD;  Location: [place] [facility];  Service: Cardiovascular;  Laterality: N/A;   JOINT REPLACEMENT     LEFT HEART CATH AND CORONARY ANGIOGRAPHY N/A 09/30/2021   Procedure: LEFT HEART CATH AND CORONARY ANGIOGRAPHY;  Surgeon: Leonie Man, MD;  Location: [place] [facility];  Service: Cardiovascular;  Laterality: N/A;   LOWER EXTREMITY ANGIOGRAPHY N/A 06/16/2020   Procedure: LOWER EXTREMITY ANGIOGRAPHY;  Surgeon: [name], MD;  Location: [place] [facility];  Service: Cardiovascular;  Laterality: N/A;   MAXIMUM ACCESS (MAS)POSTERIOR LUMBAR INTERBODY FUSION (PLIF) 1 LEVEL N/A 09/30/2014   Procedure: FOR MAXIMUM ACCESS (MAS) POSTERIOR LUMBAR INTERBODY FUSION (PLIF) 1 LEVEL;  Surgeon: Eustace Moore, MD;  Location: [place] [facility];  Service: Neurosurgery;  Laterality: N/A;  FOR MAXIMUM ACCESS (MAS) POSTERIOR LUMBAR INTERBODY FUSION (PLIF) 1 LEVEL LUMBAR 4-5   MULTIPLE TOOTH EXTRACTIONS Right 04/2017   NM MYOVIEW LTD  06/2017    Normal EF, 66%. No ST changes. Normal study. LOW RISK.   PATCH ANGIOPLASTY Left 09/11/2022   Procedure: LEFT FEMORAL PATCH ANGIOPLASTY USING XENOSURE BIOLOGIC PATCH 1CMX14CM;  Surgeon: [name], MD;  Location: [place];  Service: Vascular;  Laterality: Left;   PERIPHERAL VASCULAR ATHERECTOMY Left 06/16/2020   Procedure: PERIPHERAL VASCULAR ATHERECTOMY;  Surgeon: [name], MD;  Location: [place] [facility];  Service: Cardiovascular;  Laterality: Left;  SFA   PERIPHERAL VASCULAR INTERVENTION Right 08/31/2022   Procedure: PERIPHERAL VASCULAR INTERVENTION;  Surgeon: [name], MD;  Location: [place] [facility];  Service: Cardiovascular;  Laterality: Right;   TOTAL HIP ARTHROPLASTY Right  01/16/2020   Procedure: RIGHT TOTAL HIP ARTHROPLASTY ANTERIOR APPROACH;  Surgeon: Mcarthur Rossetti, MD;  Location: [facility];  Service: Orthopedics;  Laterality: Right;   TOTAL HIP ARTHROPLASTY Left 04/30/2020   Procedure: LEFT TOTAL HIP ARTHROPLASTY ANTERIOR APPROACH;  Surgeon: Mcarthur Rossetti, MD;  Location: [facility];  Service: Orthopedics;  Laterality: Left;   TRANSTHORACIC ECHOCARDIOGRAM  09/07/2013   Done to evaluate TIA: Normal LV size and function. EF 55-60%. GR 1 DD. Mild left atrial dilation. Mildly calcified mitral leaflets. Otherwise normal.    Family History  Problem Relation Age of Onset   Stroke Father    Heart attack Mother    Stroke Mother     SOCIAL HISTORY: Social History   Tobacco Use   Smoking status: Some Days    Packs/day: 0.00    Years: 36.00    Total pack years: 0.00    Types: Cigarettes    Passive exposure: Never   Smokeless tobacco: Never   Tobacco comments:    [name] says she smokes one cigarette every couple of weeks. [name] says she has a lot of friends who still smoke  Substance Use Topics   Alcohol use: Yes    Alcohol/week: 2.0 standard drinks of alcohol  Types: 2 Glasses of wine per week    Comment: wine    Allergies  Allergen Reactions   Penicillins Swelling and Other (See Comments)    TONGUE SWELLING  PATIENT HAD A PCN REACTION WITH IMMEDIATE RASH, FACIAL/TONGUE/THROAT SWELLING, SOB, OR LIGHTHEADEDNESS WITH HYPOTENSION:  #  #  #  YES  #  #  #   Has patient had a PCN reaction causing severe rash involving mucus membranes or skin necrosis: No Has patient had a PCN reaction that required hospitalization: No Has patient had a PCN reaction occurring within the last 10 years: No    Nucynta Er [Tapentadol Hcl Er] Other (See Comments)    Unknown   Statins     muscle pain   Statins Support [A-G Pro] Other (See Comments)   Lyrica [Pregabalin] Other (See Comments)    DRY MOUTH    Current Outpatient Medications  Medication Sig  Dispense Refill   albuterol (VENTOLIN HFA) 108 (90 Base) MCG/ACT inhaler Inhale 2 puffs into the lungs daily as needed for shortness of breath.     amLODipine (NORVASC) 10 MG tablet 1 tablet     Apoaequorin (PREVAGEN) 10 MG CAPS Take 10 mg by mouth daily.     ARIPiprazole (ABILIFY) 2 MG tablet Take 2 mg by mouth every morning.     Ascorbic Acid (VITAMIN C) 1000 MG tablet Take 1,000 mg by mouth 2 (two) times a week.     aspirin 81 MG chewable tablet 1 tablet     BINAXNOW COVID-19 AG HOME TEST KIT See admin instructions.     bisoprolol (ZEBETA) 5 MG tablet Take 1 tablet (5 mg total) by mouth daily. 90 tablet 3   Brimonidine Tartrate (LUMIFY) 0.025 % SOLN Place 1 drop into both eyes daily.     buPROPion (WELLBUTRIN XL) 150 MG 24 hr tablet Take 150 mg by mouth every morning.     Calcium Carbonate-Vitamin D (CALTRATE 600+D PO) Take 1 tablet by mouth daily.     clopidogrel (PLAVIX) 75 MG tablet Take 1 tablet by mouth daily. 90 tablet 2   desvenlafaxine (PRISTIQ) 100 MG 24 hr tablet Take 100 mg by mouth daily.     diazepam (VALIUM) 10 MG tablet Take 5 mg by mouth at bedtime.     Estradiol 10 MCG TABS vaginal tablet Place 10 mcg vaginally once a week.      Evolocumab (REPATHA) 140 MG/ML SOSY Inject 140 mg into the skin every 14 (fourteen) days. 2.1 mL 3   lisinopril (ZESTRIL) 10 MG tablet Take 10 mg by mouth daily.     Multiple Vitamins-Minerals (CENTRUM SILVER 50+WOMEN) TABS Take 1 tablet by mouth daily.     naphazoline-pheniramine (VISINE) 0.025-0.3 % ophthalmic solution Place 1 drop into both eyes 4 (four) times daily as needed (Dry eye).     Oxycodone HCl 10 MG TABS Take 1 tablet (10 mg total) by mouth 4 (four) times daily. 20 tablet 0   prazosin (MINIPRESS) 1 MG capsule Take 1 mg by mouth at bedtime.     Propylhexedrine (BENZEDREX NA) Place 1 spray into the nose 2 (two) times daily.     REPATHA SURECLICK 209 MG/ML SOAJ INJECT CONTENTS OF 1 PEN SUBCUTANEOUSLY EVERY 14 DAYS. 2 mL 11   tiotropium  (SPIRIVA HANDIHALER) 18 MCG inhalation capsule Place 18 mcg into inhaler and inhale daily as needed (Shortness of breath).     traZODone (DESYREL) 50 MG tablet Take 50 mg by mouth at bedtime.  No current facility-administered medications for this visit.    REVIEW OF SYSTEMS:  '[X]'  denotes positive finding, '[ ]'  denotes negative finding Cardiac  Comments:  Chest pain or chest pressure:    Shortness of breath upon exertion:    Short of breath when lying flat:    Irregular heart rhythm:        Vascular    Pain in calf, thigh, or hip brought on by ambulation:    Pain in feet at night that wakes you up from your sleep:     Blood clot in your veins:    Leg swelling:         Pulmonary    Oxygen at home:    Productive cough:     Wheezing:         Neurologic    Sudden weakness in arms or legs:     Sudden numbness in arms or legs:     Sudden onset of difficulty speaking or slurred speech:    Temporary loss of vision in one eye:     Problems with dizziness:         Gastrointestinal    Blood in stool:     Vomited blood:         Genitourinary    Burning when urinating:     Blood in urine:        Psychiatric    Major depression:         Hematologic    Bleeding problems:    Problems with blood clotting too easily:        Skin    Rashes or ulcers:        Constitutional    Fever or chills:      PHYSICAL EXAM: Vitals:   09/26/22 1446  BP: 100/66  Pulse: (!) 118  Resp: 14  Temp: (!) 97.4 F (36.3 C)  TempSrc: Temporal  SpO2: 91%  Weight: 128 lb (58.1 kg)  Height: '5\' 5"'  (1.651 m)    GENERAL: The patient is a well-nourished female, in no acute distress. The vital signs are documented above. CARDIAC: There is a regular rate and rhythm.  VASCULAR:  Left groin incision  Brisk DP/PT signals BLE  DATA:   N/A  Assessment/Plan:  70 y.o. female with multiple medical problems as noted below that presents for postop check.  She most recently underwent left common  femoral endarterectomy with a separate left SFA endarterectomy with bovine patch on 09/11/2022 for lifestyle-limiting claudication.  Her groin is healing without issues.  Her calf burning is better.  She also underwent a right SFA angioplasty with stent placement x2 on 08/31/2022 for recurrent high-grade stenosis in the SFA proximal to a popliteal bypass performed by Dr. Bridgett Larsson years ago.  Left groin appears to be healing.  I have asked that she start washing this with Dial soap and keeping it dry.  She has a little separation laterally.  I will have her return in a couple weeks for a final groin check.  Then will need surveillance of her lower extremity interventions.  Discussed no smoking as well as aspirin plavix and has statin allergy.   [name], MD [facility] Office: [phone]

## 2022-10-05 DIAGNOSIS — M5116 Intervertebral disc disorders with radiculopathy, lumbar region: Secondary | ICD-10-CM | POA: Diagnosis not present

## 2022-10-05 DIAGNOSIS — M545 Low back pain, unspecified: Secondary | ICD-10-CM | POA: Diagnosis not present

## 2022-10-05 DIAGNOSIS — Z23 Encounter for immunization: Secondary | ICD-10-CM | POA: Diagnosis not present

## 2022-10-05 DIAGNOSIS — Z79891 Long term (current) use of opiate analgesic: Secondary | ICD-10-CM | POA: Diagnosis not present

## 2022-10-09 NOTE — Progress Notes (Unsigned)
Office Note     CC:  follow up Requesting Provider:  Michael Boston, MD  HPI: Brenda Carney is a 71 y.o. (1951/08/18) female who presents for post op wound check. She most recently underwent left common femoral endarterectomy with a separate left SFA endarterectomy with bovine patch on 09/11/2022 for lifestyle-limiting claudication. She also underwent a right SFA angioplasty with stent placement x2 on 08/31/2022 for recurrent high-grade stenosis in the SFA proximal to a remote popliteal bypass performed by Dr. Bridgett Larsson. At her last visit 2 weeks ago her groin was healing, some concern of mild separation on lateral aspect. Otherwise her claudication symptoms were improved.   Today she reports that her groin overall is doing well. She denies any redness or drainage. She does still have some soreness but this is improving. She continues to report calf discomfort on ambulation very short distances. She says she cant walk from her living room to her kitchen without discomfort. This resolves with rest. Both legs are the same. She explains that she has a large yard and she use to walk her dogs three laps but has not been doing that recently because it has been uncomfortable. She denies any rest pain or tissue loss.   In the past she underwent right SFA laser atherectomy with balloon angioplasty including drug-coated balloon on 06/16/2020.  This was for recurrent claudication in the setting of previous right above-knee to below-knee pop bypass with vein by Dr. Bridgett Larsson in 2018.  On arteriogram she was found to have an occluded SFA proximal to the bypass and the bypass remained patent.    The pt is not on a statin for cholesterol management due to intolerance The pt is on a daily aspirin.   Other AC:  Plavix The pt is on CCB, BB, ACE for hypertension.   The pt is not diabetic.  Tobacco hx:  current  Past Medical History:  Diagnosis Date   Arthritis    "thumbs; hips" (05/14/2017)   Cerebral aneurysm    Right  ophthalmic artery, left callosal marginal anterior cerebral artery branch   Cerebrovascular disease    Carotid dopplers which showed no significant increase in velocities, extremely minimal on the left, antegrade vertebral bilaterally.   Cervical spondylosis    Chronic lower back pain    COPD (chronic obstructive pulmonary disease) (HCC)    "little bit" (05/14/2017)   Coronary artery disease, non-occlusive    Coronary calcium scoring: June 2018: Score 1106. 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   Pneumonia 2020   Sleep apnea    does not wear CPAP; "think it was medication related" (05/14/2017)   Stroke [facility]) 2010   TIA   Thoracic aortic ectasia (HCC)    ~40 mm on Coronary Calcium Score CT - recommend f/u CTA or MRA   TIA (transient ischemic attack) 09/2011   "several"    Past  Surgical History:  Procedure Laterality Date   ABDOMINAL AORTOGRAM W/LOWER EXTREMITY N/A 05/14/2017   Procedure: Abdominal Aortogram w/Lower Extremity;  Surgeon: Conrad Harlowton, MD;  Location: [place] [facility];  Service: Cardiovascular;  Laterality: N/A;   ABDOMINAL AORTOGRAM W/LOWER EXTREMITY N/A 08/31/2022   Procedure: ABDOMINAL AORTOGRAM W/LOWER EXTREMITY;  Surgeon: [name], MD;  Location: [place] [facility];  Service: Cardiovascular;  Laterality: N/A;   ACROMIO-CLAVICULAR JOINT REPAIR Left 1990s   "I think it was called an [facility] joint removal"   BACK SURGERY      BYPASS GRAFT POPLITEAL TO POPLITEAL Right 05/15/2017   Procedure: BYPASS GRAFT ABOVE KNEE POPLITEAL TO BELOW KNEE POPLITEAL ARTERY;  Surgeon: Conrad Free Soil, MD;  Location: [place];  Service: Vascular;  Laterality: Right;   CARDIAC CATHETERIZATION     CORONARY STENT INTERVENTION N/A 09/30/2021   Procedure: CORONARY STENT INTERVENTION;  Surgeon: Leonie Man, MD;  Location: [place] [facility];  Service: Cardiovascular;  Laterality: N/A;   ENDARTERECTOMY FEMORAL Left 09/11/2022   Procedure: LEFT COMMON FEMORAL ENDARTERECTOMY;  Surgeon: [name], MD;  Location: [facility];  Service: Vascular;  Laterality: Left;   ESOPHAGOGASTRODUODENOSCOPY (EGD) WITH PROPOFOL N/A 04/29/2018   Procedure: ESOPHAGOGASTRODUODENOSCOPY (EGD) WITH PROPOFOL;  Surgeon: Otis Brace, MD;  Location: [place];  Service: Gastroenterology;  Laterality: N/A;   FOOT FRACTURE SURGERY Bilateral    multiple procedures   FRACTURE SURGERY     INTRAVASCULAR ULTRASOUND/IVUS N/A 09/30/2021   Procedure: Intravascular Ultrasound/IVUS;  Surgeon: Leonie Man, MD;  Location: [place] [facility];  Service: Cardiovascular;  Laterality: N/A;   JOINT REPLACEMENT     LEFT HEART CATH AND CORONARY ANGIOGRAPHY N/A 09/30/2021   Procedure: LEFT HEART CATH AND CORONARY ANGIOGRAPHY;  Surgeon: Leonie Man, MD;  Location: [place] [facility];  Service: Cardiovascular;  Laterality: N/A;   LOWER EXTREMITY ANGIOGRAPHY N/A 06/16/2020   Procedure: LOWER EXTREMITY ANGIOGRAPHY;  Surgeon: [name], MD;  Location: [place] [facility];  Service: Cardiovascular;  Laterality: N/A;   MAXIMUM ACCESS (MAS)POSTERIOR LUMBAR INTERBODY FUSION (PLIF) 1 LEVEL N/A 09/30/2014   Procedure: FOR MAXIMUM ACCESS (MAS) POSTERIOR LUMBAR INTERBODY FUSION (PLIF) 1 LEVEL;  Surgeon: Eustace Moore, MD;  Location: [place] [facility];  Service: Neurosurgery;  Laterality: N/A;  FOR MAXIMUM ACCESS (MAS) POSTERIOR LUMBAR INTERBODY FUSION (PLIF) 1 LEVEL LUMBAR 4-5    MULTIPLE TOOTH EXTRACTIONS Right 04/2017   NM MYOVIEW LTD  06/2017    Normal EF, 66%. No ST changes. Normal study. LOW RISK.   PATCH ANGIOPLASTY Left 09/11/2022   Procedure: LEFT FEMORAL PATCH ANGIOPLASTY USING XENOSURE BIOLOGIC PATCH 1CMX14CM;  Surgeon: [name], MD;  Location: [place];  Service: Vascular;  Laterality: Left;   PERIPHERAL VASCULAR ATHERECTOMY Left 06/16/2020   Procedure: PERIPHERAL VASCULAR ATHERECTOMY;  Surgeon: [name], MD;  Location: [place] [facility];  Service: Cardiovascular;  Laterality: Left;  SFA   PERIPHERAL VASCULAR INTERVENTION Right 08/31/2022   Procedure: PERIPHERAL VASCULAR INTERVENTION;  Surgeon: [name], MD;  Location: [place] [facility];  Service: Cardiovascular;  Laterality: Right;   TOTAL HIP ARTHROPLASTY Right 01/16/2020   Procedure: RIGHT TOTAL HIP ARTHROPLASTY ANTERIOR APPROACH;  Surgeon: Mcarthur Rossetti, MD;  Location: [facility];  Service: Orthopedics;  Laterality: Right;   TOTAL HIP ARTHROPLASTY Left 04/30/2020   Procedure: LEFT TOTAL HIP ARTHROPLASTY ANTERIOR APPROACH;  Surgeon: Mcarthur Rossetti, MD;  Location: [facility];  Service: Orthopedics;  Laterality: Left;  TRANSTHORACIC ECHOCARDIOGRAM  09/07/2013   Done to evaluate TIA: Normal LV size and function. EF 55-60%. GR 1 DD. Mild left atrial dilation. Mildly calcified mitral leaflets. Otherwise normal.    Social History   Socioeconomic History   Marital status: Widowed    Spouse name: [name]    Number of children: 0   Years of education: COLLEGE   Highest education level: Some college, no degree  Occupational History   Occupation: Medical illustrator   Tobacco Use   Smoking status: Some Days    Packs/day: 0.00    Years: 36.00    Total pack years: 0.00    Types: Cigarettes    Passive exposure: Never   Smokeless tobacco: Never   Tobacco comments:    [name] says she smokes one cigarette every couple of weeks. [name] says she has a lot of friends who still  smoke  Vaping Use   Vaping Use: Former  Substance and Sexual Activity   Alcohol use: Yes    Alcohol/week: 2.0 standard drinks of alcohol    Types: 2 Glasses of wine per week    Comment: wine   Drug use: No   Sexual activity: Not Currently    Birth control/protection: Post-menopausal  Other Topics Concern   Not on file  Social History Narrative   Patient lives Alone is a Widow Patient has no children.    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    Patient has BA degree.    Smokes 1 cigarette every couple of weeks.   Social Determinants of Health   Financial Resource Strain: Not on file  Food Insecurity: Not on file  Transportation Needs: Not on file  Physical Activity: Not on file  Stress: Not on file  Social Connections: Not on file  Intimate Partner Violence: Not on file    Family History  Problem Relation Age of Onset   Stroke Father    Heart attack Mother    Stroke Mother     Current Outpatient Medications  Medication Sig Dispense Refill   albuterol (VENTOLIN HFA) 108 (90 Base) MCG/ACT inhaler Inhale 2 puffs into the lungs daily as needed for shortness of breath.     amLODipine (NORVASC) 10 MG tablet 1 tablet     Apoaequorin (PREVAGEN) 10 MG CAPS Take 10 mg by mouth daily.     ARIPiprazole (ABILIFY) 2 MG tablet Take 2 mg by mouth every morning.     Ascorbic Acid (VITAMIN C) 1000 MG tablet Take 1,000 mg by mouth 2 (two) times a week.     aspirin 81 MG chewable tablet 1 tablet     BINAXNOW COVID-19 AG HOME TEST KIT See admin instructions.     bisoprolol (ZEBETA) 5 MG tablet Take 1 tablet (5 mg total) by mouth daily. 90 tablet 3   Brimonidine Tartrate (LUMIFY) 0.025 % SOLN Place 1 drop into both eyes daily.     buPROPion (WELLBUTRIN XL) 150 MG 24 hr tablet Take 150 mg by mouth every morning.     Calcium Carbonate-Vitamin D (CALTRATE 600+D PO) Take 1 tablet by mouth daily.     clopidogrel (PLAVIX) 75 MG tablet Take 1 tablet by mouth daily. 90 tablet 2    desvenlafaxine (PRISTIQ) 100 MG 24 hr tablet Take 100 mg by mouth daily.     diazepam (VALIUM) 10 MG tablet Take 5 mg by mouth at bedtime.     Estradiol 10 MCG TABS vaginal tablet Place 10 mcg  vaginally once a week.      Evolocumab (REPATHA) 140 MG/ML SOSY Inject 140 mg into the skin every 14 (fourteen) days. 2.1 mL 3   lisinopril (ZESTRIL) 10 MG tablet Take 10 mg by mouth daily.     Multiple Vitamins-Minerals (CENTRUM SILVER 50+WOMEN) TABS Take 1 tablet by mouth daily.     naphazoline-pheniramine (VISINE) 0.025-0.3 % ophthalmic solution Place 1 drop into both eyes 4 (four) times daily as needed (Dry eye).     Oxycodone HCl 10 MG TABS Take 1 tablet (10 mg total) by mouth 4 (four) times daily. 20 tablet 0   oxyCODONE-acetaminophen (PERCOCET) 10-325 MG tablet Take 1 tablet by mouth 4 (four) times daily as needed.     prazosin (MINIPRESS) 1 MG capsule Take 1 mg by mouth at bedtime.     Propylhexedrine (BENZEDREX NA) Place 1 spray into the nose 2 (two) times daily.     REPATHA SURECLICK 638 MG/ML SOAJ INJECT CONTENTS OF 1 PEN SUBCUTANEOUSLY EVERY 14 DAYS. 2 mL 11   tiotropium (SPIRIVA HANDIHALER) 18 MCG inhalation capsule Place 18 mcg into inhaler and inhale daily as needed (Shortness of breath).     traZODone (DESYREL) 50 MG tablet Take 50 mg by mouth at bedtime.     zolpidem (AMBIEN) 10 MG tablet Take 15-20 mg by mouth at bedtime.     No current facility-administered medications for this visit.    Allergies  Allergen Reactions   Penicillins Swelling and Other (See Comments)    TONGUE SWELLING  PATIENT HAD A PCN REACTION WITH IMMEDIATE RASH, FACIAL/TONGUE/THROAT SWELLING, SOB, OR LIGHTHEADEDNESS WITH HYPOTENSION:  #  #  #  YES  #  #  #   Has patient had a PCN reaction causing severe rash involving mucus membranes or skin necrosis: No Has patient had a PCN reaction that required hospitalization: No Has patient had a PCN reaction occurring within the last 10 years: No    Nucynta Er  [Tapentadol Hcl Er] Other (See Comments)    Unknown   Statins     muscle pain   Statins Support [A-G Pro] Other (See Comments)   Lyrica [Pregabalin] Other (See Comments)    DRY MOUTH     REVIEW OF SYSTEMS:  X] denotes positive finding, '[ ]'  denotes negative finding Cardiac  Comments:  Chest pain or chest pressure:    Shortness of breath upon exertion:    Short of breath when lying flat:    Irregular heart rhythm:        Vascular    Pain in calf, thigh, or hip brought on by ambulation:    Pain in feet at night that wakes you up from your sleep:     Blood clot in your veins:    Leg swelling:         Pulmonary    Oxygen at home:    Productive cough:     Wheezing:         Neurologic    Sudden weakness in arms or legs:     Sudden numbness in arms or legs:     Sudden onset of difficulty speaking or slurred speech:    Temporary loss of vision in one eye:     Problems with dizziness:         Gastrointestinal    Blood in stool:     Vomited blood:         Genitourinary    Burning when urinating:     Blood  in urine:        Psychiatric    Major depression:         Hematologic    Bleeding problems:    Problems with blood clotting too easily:        Skin    Rashes or ulcers:        Constitutional    Fever or chills:      PHYSICAL EXAMINATION:  Vitals:   10/10/22 0914  BP: 107/82  Pulse: (!) 107  Resp: 20  Temp: 98.2 F (36.8 C)  TempSrc: Temporal  SpO2: 95%  Weight: 126 lb 12.8 oz (57.5 kg)  Height: '5\' 5"'  (1.651 m)    General:  WDWN in NAD; vital signs documented above Gait: Normal HENT: WNL, normocephalic Pulmonary: normal non-labored breathing  Cardiac: regular HR Abdomen: soft, ND Vascular Exam/Pulses: 2+ DP pulses bilaterally. Feet warm and well perfused. Left groin essentially healed. Small 2 cm area on proximal lateral aspect of incision that is still healing. No drainage. No erythema Extremities: without ischemic changes, without Gangrene ,  without cellulitis; without open wounds;  Musculoskeletal: no muscle wasting or atrophy  Neurologic: A&O X 3;  No focal weakness or paresthesias are detected Psychiatric:  The pt has Normal affect.  ASSESSMENT/PLAN:: 71 y.o. female here for follow up wound check. She most recently underwent left common femoral endarterectomy with a separate left SFA endarterectomy with bovine patch on 09/11/2022 for lifestyle-limiting claudication. She also underwent a right SFA angioplasty with stent placement x2 on 08/31/2022 for recurrent high-grade stenosis in the SFA proximal to a remote popliteal bypass performed by Dr. Bridgett Larsson.  - her left groin incision is almost completely healed. Advised her to continue to wash with mild soap and water and monitor for any signs of infection - She still is having bilateral claudication symptoms R =L even after intervention. I have encouraged her to try to establish a walking regimen again - She will continue Aspirin, Plavix, Repatha - She will follow up in 6 weeks with ABI and LLE arterial duplex   Karoline Caldwell, PA-C [facility] [phone]  Clinic MD:   Carlis Abbott

## 2022-10-10 ENCOUNTER — Ambulatory Visit (INDEPENDENT_AMBULATORY_CARE_PROVIDER_SITE_OTHER): Payer: Medicare Other | Admitting: Physician Assistant

## 2022-10-10 VITALS — BP 107/82 | HR 107 | Temp 98.2°F | Resp 20 | Ht 65.0 in | Wt 126.8 lb

## 2022-10-10 DIAGNOSIS — I739 Peripheral vascular disease, unspecified: Secondary | ICD-10-CM

## 2022-10-12 ENCOUNTER — Other Ambulatory Visit: Payer: Self-pay

## 2022-10-12 DIAGNOSIS — I70202 Unspecified atherosclerosis of native arteries of extremities, left leg: Secondary | ICD-10-CM

## 2022-10-12 DIAGNOSIS — I739 Peripheral vascular disease, unspecified: Secondary | ICD-10-CM

## 2022-10-16 ENCOUNTER — Other Ambulatory Visit (HOSPITAL_COMMUNITY): Payer: Medicare Other

## 2022-10-23 DIAGNOSIS — E782 Mixed hyperlipidemia: Secondary | ICD-10-CM | POA: Diagnosis not present

## 2022-10-23 DIAGNOSIS — M810 Age-related osteoporosis without current pathological fracture: Secondary | ICD-10-CM | POA: Diagnosis not present

## 2022-10-23 DIAGNOSIS — I10 Essential (primary) hypertension: Secondary | ICD-10-CM | POA: Diagnosis not present

## 2022-10-23 DIAGNOSIS — R7989 Other specified abnormal findings of blood chemistry: Secondary | ICD-10-CM | POA: Diagnosis not present

## 2022-10-30 DIAGNOSIS — M5116 Intervertebral disc disorders with radiculopathy, lumbar region: Secondary | ICD-10-CM | POA: Diagnosis not present

## 2022-10-30 DIAGNOSIS — M545 Low back pain, unspecified: Secondary | ICD-10-CM | POA: Diagnosis not present

## 2022-10-30 DIAGNOSIS — Z79891 Long term (current) use of opiate analgesic: Secondary | ICD-10-CM | POA: Diagnosis not present

## 2022-10-31 DIAGNOSIS — Z1331 Encounter for screening for depression: Secondary | ICD-10-CM | POA: Diagnosis not present

## 2022-10-31 DIAGNOSIS — M8589 Other specified disorders of bone density and structure, multiple sites: Secondary | ICD-10-CM | POA: Diagnosis not present

## 2022-10-31 DIAGNOSIS — I2510 Atherosclerotic heart disease of native coronary artery without angina pectoris: Secondary | ICD-10-CM | POA: Diagnosis not present

## 2022-10-31 DIAGNOSIS — Z1339 Encounter for screening examination for other mental health and behavioral disorders: Secondary | ICD-10-CM | POA: Diagnosis not present

## 2022-10-31 DIAGNOSIS — F325 Major depressive disorder, single episode, in full remission: Secondary | ICD-10-CM | POA: Diagnosis not present

## 2022-10-31 DIAGNOSIS — I719 Aortic aneurysm of unspecified site, without rupture: Secondary | ICD-10-CM | POA: Diagnosis not present

## 2022-10-31 DIAGNOSIS — Z23 Encounter for immunization: Secondary | ICD-10-CM | POA: Diagnosis not present

## 2022-10-31 DIAGNOSIS — Z0000 Encounter for general adult medical examination without abnormal findings: Secondary | ICD-10-CM | POA: Diagnosis not present

## 2022-10-31 DIAGNOSIS — G729 Myopathy, unspecified: Secondary | ICD-10-CM | POA: Diagnosis not present

## 2022-10-31 DIAGNOSIS — E782 Mixed hyperlipidemia: Secondary | ICD-10-CM | POA: Diagnosis not present

## 2022-10-31 DIAGNOSIS — Z87891 Personal history of nicotine dependence: Secondary | ICD-10-CM | POA: Diagnosis not present

## 2022-10-31 DIAGNOSIS — I10 Essential (primary) hypertension: Secondary | ICD-10-CM | POA: Diagnosis not present

## 2022-10-31 DIAGNOSIS — I739 Peripheral vascular disease, unspecified: Secondary | ICD-10-CM | POA: Diagnosis not present

## 2022-10-31 DIAGNOSIS — J449 Chronic obstructive pulmonary disease, unspecified: Secondary | ICD-10-CM | POA: Diagnosis not present

## 2022-11-07 DIAGNOSIS — G4709 Other insomnia: Secondary | ICD-10-CM | POA: Diagnosis not present

## 2022-11-07 DIAGNOSIS — F39 Unspecified mood [affective] disorder: Secondary | ICD-10-CM | POA: Diagnosis not present

## 2022-11-07 DIAGNOSIS — F341 Dysthymic disorder: Secondary | ICD-10-CM | POA: Diagnosis not present

## 2022-11-07 DIAGNOSIS — F4312 Post-traumatic stress disorder, chronic: Secondary | ICD-10-CM | POA: Diagnosis not present

## 2022-11-21 ENCOUNTER — Other Ambulatory Visit: Payer: Self-pay | Admitting: Vascular Surgery

## 2022-11-27 ENCOUNTER — Ambulatory Visit (INDEPENDENT_AMBULATORY_CARE_PROVIDER_SITE_OTHER): Payer: Medicare Other

## 2022-11-27 VITALS — BP 148/69 | HR 84 | Temp 97.3°F | Resp 16 | Ht 65.0 in | Wt 125.2 lb

## 2022-11-27 DIAGNOSIS — Z79891 Long term (current) use of opiate analgesic: Secondary | ICD-10-CM | POA: Diagnosis not present

## 2022-11-27 DIAGNOSIS — M810 Age-related osteoporosis without current pathological fracture: Secondary | ICD-10-CM

## 2022-11-27 DIAGNOSIS — M545 Low back pain, unspecified: Secondary | ICD-10-CM | POA: Diagnosis not present

## 2022-11-27 DIAGNOSIS — M5116 Intervertebral disc disorders with radiculopathy, lumbar region: Secondary | ICD-10-CM | POA: Diagnosis not present

## 2022-11-27 MED ORDER — DENOSUMAB 60 MG/ML ~~LOC~~ SOSY
60.0000 mg | PREFILLED_SYRINGE | Freq: Once | SUBCUTANEOUS | Status: AC
  Administered 2022-11-27: 60 mg via SUBCUTANEOUS
  Filled 2022-11-27: qty 1

## 2022-11-27 NOTE — Progress Notes (Signed)
Diagnosis: Osteoporosis  Provider:  Marshell Garfinkel MD  Procedure: Injection  Prolia (Denosumab), Dose: 60 mg, Site: subcutaneous, Number of injections: 1   Discharge: Condition: Good, Destination: Home . AVS provided to patient.   Performed by:  Koren Shiver, RN

## 2022-11-28 ENCOUNTER — Ambulatory Visit (INDEPENDENT_AMBULATORY_CARE_PROVIDER_SITE_OTHER): Payer: Medicare Other | Admitting: Physician Assistant

## 2022-11-28 ENCOUNTER — Ambulatory Visit (INDEPENDENT_AMBULATORY_CARE_PROVIDER_SITE_OTHER)
Admission: RE | Admit: 2022-11-28 | Discharge: 2022-11-28 | Disposition: A | Discharge status: Home / Self Care | Payer: Medicare Other | Source: Ambulatory Visit | Attending: Vascular Surgery | Admitting: Vascular Surgery

## 2022-11-28 ENCOUNTER — Ambulatory Visit (HOSPITAL_COMMUNITY)
Admission: RE | Admit: 2022-11-28 | Discharge: 2022-11-28 | Disposition: A | Discharge status: Home / Self Care | Payer: Medicare Other | Source: Ambulatory Visit | Attending: Vascular Surgery | Admitting: Vascular Surgery

## 2022-11-28 VITALS — BP 136/92 | HR 79 | Temp 98.0°F | Resp 20 | Ht 65.0 in | Wt 121.0 lb

## 2022-11-28 DIAGNOSIS — I70202 Unspecified atherosclerosis of native arteries of extremities, left leg: Secondary | ICD-10-CM | POA: Insufficient documentation

## 2022-11-28 DIAGNOSIS — I739 Peripheral vascular disease, unspecified: Secondary | ICD-10-CM | POA: Insufficient documentation

## 2022-11-28 NOTE — Progress Notes (Signed)
[facility] [place] HISTORY AND PHYSICAL   History of Present Illness:  Patient is a 71 y.o. year old female who presents for evaluation of claudication.  She is s/p left common femoral endarterectomy with a separate left SFA endarterectomy with bovine patch on 09/11/2022 for lifestyle-limiting claudication. She also underwent a right SFA angioplasty with stent placement x2 on 08/31/2022 for recurrent high-grade stenosis in the SFA proximal to a remote popliteal bypass performed by Dr. Bridgett Larsson.   She denies claudication, rest pain or loss of sensation.    The pt is not on a statin for cholesterol management due to intolerance The pt is on a daily aspirin.   Other AC:  Plavix The pt is on CCB, BB, ACE for hypertension.   The pt is not diabetic.  Tobacco hx:  current   Past Medical History:  Diagnosis Date   Arthritis    "thumbs; hips" (05/14/2017)   Cerebral aneurysm    Right ophthalmic artery, left callosal marginal anterior cerebral artery branch   Cerebrovascular disease    Carotid dopplers which showed no significant increase in velocities, extremely minimal on the left, antegrade vertebral bilaterally.   Cervical spondylosis    Chronic lower back pain    COPD (chronic obstructive pulmonary disease) (HCC)    "little bit" (05/14/2017)   Coronary artery disease, non-occlusive    Coronary calcium scoring: June 2018: Score 1106. 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 Currently on Livalo 4 mg   Hypertension, benign    Lumbosacral spondylosis    PAD (peripheral artery disease) (HCC)    Status post right above-the-knee-below the knee popliteal artery  bypass; postop right ABI 0.96.   Pneumonia 2020   Sleep apnea    does not wear CPAP; "think it was medication related" (05/14/2017)   Stroke [facility]) 2010   TIA   Thoracic aortic ectasia (HCC)    ~40 mm on Coronary Calcium Score CT - recommend f/u CTA or MRA   TIA (transient ischemic attack) 09/2011   "several"    Past Surgical History:  Procedure Laterality Date   ABDOMINAL AORTOGRAM W/LOWER EXTREMITY N/A 05/14/2017   Procedure: Abdominal Aortogram w/Lower Extremity;  Surgeon: Conrad Homestead, MD;  Location: [place] [facility];  Service: Cardiovascular;  Laterality: N/A;   ABDOMINAL AORTOGRAM W/LOWER EXTREMITY N/A 08/31/2022   Procedure: ABDOMINAL AORTOGRAM W/LOWER EXTREMITY;  Surgeon: [name], MD;  Location: [place] [facility];  Service: Cardiovascular;  Laterality: N/A;   ACROMIO-CLAVICULAR JOINT REPAIR Left 1990s   "I think it was called an [facility] joint removal"   BACK SURGERY     BYPASS GRAFT POPLITEAL TO POPLITEAL Right 05/15/2017   Procedure: BYPASS GRAFT ABOVE KNEE POPLITEAL TO BELOW KNEE POPLITEAL ARTERY;  Surgeon: Conrad [place], MD;  Location: [place];  Service: Vascular;  Laterality: Right;   CARDIAC CATHETERIZATION     CORONARY STENT INTERVENTION N/A 09/30/2021   Procedure: CORONARY STENT INTERVENTION;  Surgeon: Leonie Man, MD;  Location: [place] [facility];  Service: Cardiovascular;  Laterality: N/A;   ENDARTERECTOMY FEMORAL Left 09/11/2022   Procedure:  LEFT COMMON FEMORAL ENDARTERECTOMY;  Surgeon: [name], MD;  Location[facility];  Service: Vascular;  Laterality: Left;   ESOPHAGOGASTRODUODENOSCOPY (EGD) WITH PROPOFOL N/A 04/29/2018   Procedure: ESOPHAGOGASTRODUODENOSCOPY (EGD) WITH PROPOFOL;  Surgeon: Otis Brace, MD;  Location: [place];  Service: Gastroenterology;  Laterality: N/A;   FOOT FRACTURE SURGERY Bilateral    multiple procedures   FRACTURE SURGERY     INTRAVASCULAR ULTRASOUND/IVUS N/A 09/30/2021   Procedure: Intravascular  Ultrasound/IVUS;  Surgeon: Leonie Man, MD;  Location: [place] [facility];  Service: Cardiovascular;  Laterality: N/A;   JOINT REPLACEMENT     LEFT HEART CATH AND CORONARY ANGIOGRAPHY N/A 09/30/2021   Procedure: LEFT HEART CATH AND CORONARY ANGIOGRAPHY;  Surgeon: Leonie Man, MD;  Location: [place] [facility];  Service: Cardiovascular;  Laterality: N/A;   LOWER EXTREMITY ANGIOGRAPHY N/A 06/16/2020   Procedure: LOWER EXTREMITY ANGIOGRAPHY;  Surgeon: [name], MD;  Location: [place] [facility];  Service: Cardiovascular;  Laterality: N/A;   MAXIMUM ACCESS (MAS)POSTERIOR LUMBAR INTERBODY FUSION (PLIF) 1 LEVEL N/A 09/30/2014   Procedure: FOR MAXIMUM ACCESS (MAS) POSTERIOR LUMBAR INTERBODY FUSION (PLIF) 1 LEVEL;  Surgeon: Eustace Moore, MD;  Location: [place] [facility];  Service: Neurosurgery;  Laterality: N/A;  FOR MAXIMUM ACCESS (MAS) POSTERIOR LUMBAR INTERBODY FUSION (PLIF) 1 LEVEL LUMBAR 4-5   MULTIPLE TOOTH EXTRACTIONS Right 04/2017   NM MYOVIEW LTD  06/2017    Normal EF, 66%. No ST changes. Normal study. LOW RISK.   PATCH ANGIOPLASTY Left 09/11/2022   Procedure: LEFT FEMORAL PATCH ANGIOPLASTY USING XENOSURE BIOLOGIC PATCH 1CMX14CM;  Surgeon: [name], MD;  Location: [place];  Service: Vascular;  Laterality: Left;   PERIPHERAL VASCULAR ATHERECTOMY Left 06/16/2020   Procedure: PERIPHERAL VASCULAR ATHERECTOMY;  Surgeon: [name], MD;  Location: [place] [facility];  Service: Cardiovascular;  Laterality: Left;  SFA   PERIPHERAL VASCULAR INTERVENTION Right 08/31/2022   Procedure: PERIPHERAL VASCULAR INTERVENTION;  Surgeon: [name], MD;  Location: [place] [facility];  Service: Cardiovascular;  Laterality: Right;   TOTAL HIP ARTHROPLASTY Right 01/16/2020   Procedure: RIGHT TOTAL HIP ARTHROPLASTY ANTERIOR APPROACH;  Surgeon: Mcarthur Rossetti, MD;  Location: [facility];  Service: Orthopedics;  Laterality: Right;   TOTAL HIP ARTHROPLASTY Left 04/30/2020    Procedure: LEFT TOTAL HIP ARTHROPLASTY ANTERIOR APPROACH;  Surgeon: Mcarthur Rossetti, MD;  Location: [facility];  Service: Orthopedics;  Laterality: Left;   TRANSTHORACIC ECHOCARDIOGRAM  09/07/2013   Done to evaluate TIA: Normal LV size and function. EF 55-60%. GR 1 DD. Mild left atrial dilation. Mildly calcified mitral leaflets. Otherwise normal.    ROS:   General:  No weight loss, Fever, chills  HEENT: No recent headaches, no nasal bleeding, no visual changes, no sore throat  Neurologic: No dizziness, blackouts, seizures. No recent symptoms of stroke or mini- stroke. No recent episodes of slurred speech, or temporary blindness.  Cardiac: No recent episodes of chest pain/pressure, no shortness of breath at rest.  No shortness of breath with exertion.  Denies history of atrial fibrillation or irregular heartbeat  Vascular: No history of rest pain in feet.  No history of claudication.  No history of non-healing ulcer, No history of DVT   Pulmonary: No home oxygen, no productive cough, no hemoptysis,  No asthma or wheezing  Musculoskeletal:  _0  Arthritis, _1  Low back pain,  _2  Joint pain  Hematologic:No history of hypercoagulable state.  No history of easy bleeding.  No history of anemia  Gastrointestinal: No hematochezia or melena,  No gastroesophageal reflux, no trouble swallowing  Urinary: _0  chronic Kidney disease, _1  on HD - _2  MWF or _3  TTHS, _4  Burning with urination, _5  Frequent urination, _6  Difficulty urinating;   Skin: No rashes  Psychological: No history of anxiety,  No history of depression  Social History Social History   Tobacco Use   Smoking status: Some Days    Packs/day: 0.00    Years: 36.00    Total pack years: 0.00    Types: Cigarettes    Passive exposure: Never   Smokeless tobacco: Never   Tobacco comments:    [name] says she smokes one cigarette every couple of weeks. [name] says she has a lot of friends who still smoke  Vaping Use   Vaping Use:  Former  Substance Use Topics   Alcohol use: Yes    Alcohol/week: 2.0 standard drinks of alcohol    Types: 2 Glasses of wine per week    Comment: wine   Drug use: No    Family History Family History  Problem Relation Age of Onset   Stroke Father    Heart attack Mother    Stroke Mother     Allergies  Allergies  Allergen Reactions   Penicillins Swelling and Other (See Comments)    TONGUE SWELLING  PATIENT HAD A PCN REACTION WITH IMMEDIATE RASH, FACIAL/TONGUE/THROAT SWELLING, SOB, OR LIGHTHEADEDNESS WITH HYPOTENSION:  #  #  #  YES  #  #  #   Has patient had a PCN reaction causing severe rash involving mucus membranes or skin necrosis: No Has patient had a PCN reaction that required hospitalization: No Has patient had a PCN reaction occurring within the last 10 years: No    Nucynta Er [Tapentadol Hcl Er] Other (See Comments)    Unknown   Statins     muscle pain   Statins Support [A-G Pro] Other (See Comments)   Lyrica [Pregabalin] Other (See Comments)    DRY MOUTH     Current Outpatient Medications  Medication Sig Dispense Refill   albuterol (VENTOLIN HFA) 108 (90 Base) MCG/ACT inhaler Inhale 2 puffs into the lungs daily as needed for shortness of breath.     amLODipine (NORVASC) 10 MG tablet 1 tablet     Apoaequorin (PREVAGEN) 10 MG CAPS Take 10 mg by mouth daily.     ARIPiprazole (ABILIFY) 2 MG tablet Take 2 mg by mouth every morning.     Ascorbic Acid (VITAMIN C) 1000 MG tablet Take 1,000 mg by mouth 2 (two) times a week.     aspirin 81 MG chewable tablet 1 tablet     BINAXNOW COVID-19 AG HOME TEST KIT See admin instructions.     bisoprolol (ZEBETA) 5 MG tablet Take 1 tablet (5 mg total) by mouth daily. 90 tablet 3   Brimonidine Tartrate (LUMIFY) 0.025 % SOLN Place 1 drop into both eyes daily.     buPROPion (WELLBUTRIN XL) 150 MG 24 hr tablet Take 150 mg by mouth every morning.     Calcium Carbonate-Vitamin D (CALTRATE 600+D PO) Take 1 tablet by mouth daily.      clopidogrel (PLAVIX) 75 MG tablet TAKE ONE TABLET BY MOUTH DAILY 90 tablet 2   desvenlafaxine (PRISTIQ) 100 MG 24 hr tablet Take 100 mg by mouth daily.     diazepam (VALIUM) 10 MG tablet Take 5 mg by mouth at bedtime.  Estradiol 10 MCG TABS vaginal tablet Place 10 mcg vaginally once a week.      Evolocumab (REPATHA) 140 MG/ML SOSY Inject 140 mg into the skin every 14 (fourteen) days. 2.1 mL 3   lisinopril (ZESTRIL) 10 MG tablet Take 10 mg by mouth daily.     Multiple Vitamins-Minerals (CENTRUM SILVER 50+WOMEN) TABS Take 1 tablet by mouth daily.     naphazoline-pheniramine (VISINE) 0.025-0.3 % ophthalmic solution Place 1 drop into both eyes 4 (four) times daily as needed (Dry eye).     Oxycodone HCl 10 MG TABS Take 1 tablet (10 mg total) by mouth 4 (four) times daily. 20 tablet 0   oxyCODONE-acetaminophen (PERCOCET) 10-325 MG tablet Take 1 tablet by mouth 4 (four) times daily as needed.     prazosin (MINIPRESS) 1 MG capsule Take 1 mg by mouth at bedtime.     Propylhexedrine (BENZEDREX NA) Place 1 spray into the nose 2 (two) times daily.     REPATHA SURECLICK 342 MG/ML SOAJ INJECT CONTENTS OF 1 PEN SUBCUTANEOUSLY EVERY 14 DAYS. 2 mL 11   tiotropium (SPIRIVA HANDIHALER) 18 MCG inhalation capsule Place 18 mcg into inhaler and inhale daily as needed (Shortness of breath).     traZODone (DESYREL) 50 MG tablet Take 50 mg by mouth at bedtime.     zolpidem (AMBIEN) 10 MG tablet Take 15-20 mg by mouth at bedtime.     No current facility-administered medications for this visit.    Physical Examination  Vitals:   11/28/22 1248  BP: (!) 136/92  Pulse: 79  Resp: 20  Temp: 98 F (36.7 C)  TempSrc: Temporal  SpO2: 96%  Weight: 121 lb (54.9 kg)  Height: _0  (1.651 m)    Body mass index is 20.14 kg/m.  General:  Alert and oriented, no acute distress HEENT: Normal Neck: No bruit or JVD Pulmonary: Clear to auscultation bilaterally Cardiac: Regular Rate and Rhythm without murmur Abdomen:  Soft, non-tender, non-distended, no mass, no scars Skin: No rash Extremity Pulses:femoral, not dorsalis pedis, right posterior tibial pulse palpable  Musculoskeletal: No deformity or edema  Neurologic: Upper and lower extremity motor 5/5 and symmetric  DATA:  +-----------+--------+-----+---------------+-------------------+--------+  LEFT      PSV cm/sRatioStenosis       Waveform           Comments  +-----------+--------+-----+---------------+-------------------+--------+  DFA       16                          dampened monophasic          +-----------+--------+-----+---------------+-------------------+--------+  SFA Prox   76                          monophasic                   +-----------+--------+-----+---------------+-------------------+--------+  SFA Mid    417          75-99% stenosismonophasic                   +-----------+--------+-----+---------------+-------------------+--------+  SFA Distal 53                          monophasic                   +-----------+--------+-----+---------------+-------------------+--------+  POP Prox   37  monophasic                   +-----------+--------+-----+---------------+-------------------+--------+  POP Distal 33                          monophasic                   +-----------+--------+-----+---------------+-------------------+--------+  ATA Distal 15                          monophasic                   +-----------+--------+-----+---------------+-------------------+--------+  PTA Distal 21                          monophasic                   +-----------+--------+-----+---------------+-------------------+--------+  PERO Distal16                          monophasic                   +-----------+--------+-----+---------------+-------------------+--------+      Summary:  Left: Patent CFA and SFA endarterectomy site.  75-99% stenosis mid  SFA.   ABI Findings:  +---------+------------------+-----+--------+--------+  Right   Rt Pressure (mmHg)IndexWaveformComment   +---------+------------------+-----+--------+--------+  Brachial 145                                      +---------+------------------+-----+--------+--------+  PTA     135               0.93 biphasic          +---------+------------------+-----+--------+--------+  DP      124               0.86 biphasic          +---------+------------------+-----+--------+--------+  Great Toe43                0.30                   +---------+------------------+-----+--------+--------+   +---------+------------------+-----+----------+-------+  Left    Lt Pressure (mmHg)IndexWaveform  Comment  +---------+------------------+-----+----------+-------+  Brachial 131                                       +---------+------------------+-----+----------+-------+  PTA     122               0.84 monophasic         +---------+------------------+-----+----------+-------+  DP      111               0.77 monophasic         +---------+------------------+-----+----------+-------+  Great Toe0                 0.00                    +---------+------------------+-----+----------+-------+   +-------+-----------+-----------+------------+------------+  ABI/TBIToday's ABIToday's TBIPrevious ABIPrevious TBI  +-------+-----------+-----------+------------+------------+  Right 0.93       0.3                                  +-------+-----------+-----------+------------+------------+  Left  0.84       0                                    +-------+-----------+-----------+------------+------------+   Previous ABI on 08/21/22 prior to intervention.    Summary:  Right: Resting right ankle-brachial index indicates mild right lower  extremity arterial disease. The right toe-brachial index is abnormal.   Left: Resting left  ankle-brachial index indicates mild left lower  extremity arterial disease. The left toe-brachial index is abnormal.    ASSESSMENT/PLAN:  Patient is a 71 y.o. year old female who presents for evaluation of claudication.  She is s/p left common femoral endarterectomy with a separate left SFA endarterectomy with bovine patch on 09/11/2022 for lifestyle-limiting claudication. She also underwent a right SFA angioplasty with stent placement x2 on 08/31/2022 for recurrent high-grade stenosis in the SFA proximal to a remote popliteal bypass performed by Dr. Bridgett Larsson.  She is asymptomatic for ischemia of B LE.  The left LE duplex demonstrates decreased inflow with a dampened monophasic and PSV of 16.  75-99% stenosis mid SFA.  Her ABI's left 0.77 monophasic 08/16/22 and now 0.83 monophasic and stable.   The inflow has decreased with PSV of 16 at the inflow needs further work up.  I have scheduled her for angiogram with runoff and possible intervention of the left LE to improve inflow and left LE arterial perfusion.           Roxy Horseman PA-C [facility] [place] Office: [phone]   MD in clinic [place]

## 2022-12-01 ENCOUNTER — Telehealth: Payer: Self-pay

## 2022-12-01 ENCOUNTER — Other Ambulatory Visit: Payer: Self-pay

## 2022-12-01 DIAGNOSIS — I739 Peripheral vascular disease, unspecified: Secondary | ICD-10-CM

## 2022-12-01 NOTE — Telephone Encounter (Signed)
Attempted to reach patient to schedule angiogram, no answer. Left message for patient to return call.

## 2022-12-01 NOTE — Telephone Encounter (Signed)
Patient returned call. Scheduled angiogram on 12/07/22 with Dr. Carlis Abbott. Instructions reviewed. Patient verbalized understanding.

## 2022-12-04 ENCOUNTER — Ambulatory Visit: Payer: Medicare Other | Attending: Cardiology | Admitting: Cardiology

## 2022-12-04 ENCOUNTER — Encounter: Payer: Self-pay | Admitting: Cardiology

## 2022-12-04 VITALS — BP 134/78 | HR 68 | Ht 65.0 in | Wt 121.6 lb

## 2022-12-04 DIAGNOSIS — I10 Essential (primary) hypertension: Secondary | ICD-10-CM | POA: Insufficient documentation

## 2022-12-04 DIAGNOSIS — E785 Hyperlipidemia, unspecified: Secondary | ICD-10-CM | POA: Diagnosis not present

## 2022-12-04 DIAGNOSIS — I2510 Atherosclerotic heart disease of native coronary artery without angina pectoris: Secondary | ICD-10-CM | POA: Diagnosis not present

## 2022-12-04 DIAGNOSIS — T466X5A Adverse effect of antihyperlipidemic and antiarteriosclerotic drugs, initial encounter: Secondary | ICD-10-CM | POA: Insufficient documentation

## 2022-12-04 DIAGNOSIS — I739 Peripheral vascular disease, unspecified: Secondary | ICD-10-CM | POA: Diagnosis not present

## 2022-12-04 DIAGNOSIS — Z720 Tobacco use: Secondary | ICD-10-CM | POA: Diagnosis not present

## 2022-12-04 DIAGNOSIS — G720 Drug-induced myopathy: Secondary | ICD-10-CM | POA: Insufficient documentation

## 2022-12-04 DIAGNOSIS — I70202 Unspecified atherosclerosis of native arteries of extremities, left leg: Secondary | ICD-10-CM

## 2022-12-04 DIAGNOSIS — Z9861 Coronary angioplasty status: Secondary | ICD-10-CM | POA: Diagnosis not present

## 2022-12-04 NOTE — Patient Instructions (Addendum)
Medication Instructions:   No t needed *If you need a refill on your cardiac medications before your next appointment, please call your pharmacy*   Lab Work: No changes If you don't have labs checked before visit - will order that day.  Testing/Procedures:  Not needed  Follow-Up: At [facility], you and your health needs are our priority.  As part of our continuing mission to provide you with exceptional heart care, we have created designated Provider Care Teams.  These Care Teams include your primary Cardiologist (physician) and Advanced Practice Providers (APPs -  Physician Assistants and Nurse Practitioners) who all work together to provide you with the care you need, when you need it.     Your next appointment:   6 month(s)  The format for your next appointment:   In Person  Provider:     or Coletta Memos, FNP    Then, Glenetta Hew, MD will plan to see you again in 12 month(s).

## 2022-12-04 NOTE — Progress Notes (Signed)
Primary Care Provider: Michael Boston, MD [place] Cardiologist: Glenetta Hew, MD Electrophysiologist: None  Clinic Note: Chief Complaint  Patient presents with   Coronary Artery Disease    No active angina symptoms.   Follow-up    Delayed 48-monthnow 8 months; postop from PAD procedures    ===================================  ASSESSMENT/PLAN   Problem List Items Addressed This Visit       Cardiology Problems   CAD S/P percutaneous coronary angioplasty - Primary (Chronic)    Status post PCI of the LCx-OM1 and RCA with essential resolution of anginal chest pain. Thankfully, no residual symptoms.  Doing very well.  Has been having issues with PAD that now in the process of being treated. With no active cardiac symptoms, no need for any preop evaluation due to revascularization within 5 years.  Aggressive risk factor modification. Continue low-dose beta-blocker and high-dose amlodipine.  (Anginal benefit.) Continue lisinopril rectal reduction Statin myopathy-on Repatha.  Lipids well-controlled. On aspirin and Plavix maintenance-mostly now for PAD.  From a cardiac standpoint would be okay to hold 5 to 7 days preop for surgeries or procedures, but from a PAD standpoint would like to probably wait through June 2024 before holding.   Defer to vascular surgery.      Hyperlipidemia with target LDL less than 70 (Chronic)    Significant statin myopathy-now on Repatha.  Most recent lipids showed significant improvement of LDL down to 36.      PAD (peripheral artery disease) (HCC) (Chronic)    Followed by vascular surgery.  Has pending completion of revascularization with staged intervention to the left SFA.  Already doing better with walking.  Minimal residual claudication.  Needs about smoking cessation counseling every time she seen. Continue close blood pressure, glycemic and lipid control. Continue to walk, active exercise.      Relevant Orders   EKG 12-Lead  (Completed)   Essential hypertension (Chronic)    She is on 10 mg of amlodipine and 10 mg of lisinopril along with 5 mg Zebeta.  BP is borderline today, we still have room to increase ACE inhibitor as well as potentially the Zebeta.        Other   Tobacco abuse (Chronic)    She is close to quitting.  Down to 1 or 2 cigarettes a day, just not quite ready to make that final step.  Hopefully once all the stress of her procedures is gone, she will be able to find another crutch.      Statin myopathy (Chronic)    After trying multiple statins, intolerant.  Has been transitioned to RGlen Headwith well-controlled lipids.      ===================================  HPI:    Brenda Carney a 71y.o. female with a PMH notable for CAD-PCI, PAD, HTN and HLD COPD who presents today for delayed 637-monthollow-up.  I have not seen her since her PCI back in October 2022.  CAD- Cardiac Catheterization-PCI 09/30/2021: Severe two-vessel CAD tandem 70% & 90% stenoses in the proximal RCA 60% distal RCA, sequential 45, 80 and 50% LCx. => DES PCI p-mCx 80%&50%, pRCA 90%& 70%  Brenda Carney was last seen on February 09, 2022 by JeColetta MemosNP-feeling well.  Compliant with meds.  Noted some dry mouth issue.  Completed cardiac rehab.  Staying physically active his left horses and dogs.  Working on increasing walking.  Labs checked.  Plan was to follow-up in 6 months, but this was delayed because of her surgery.  Recent  Hospitalizations:  08/31/2022: Abdominal Aortogram with Bilat LEA Runoff & PTA/Stent 09/11/2022-admitted for PAD surgery  Reviewed  CV studies:    The following studies were reviewed today: (if available, images/films reviewed: From Epic Chart or Care Everywhere) 08/31/2022: L CFA heavily calcified - no flow limiting Ao-Iliac segment. R CFA & PFA patent, diffuse RSFA disease prox to bypass ~ 80%. Patent bypass (Fem-BKPop) w/ dominant runoff in Peroneal & PTA. L CFA high grade ~80% with RPFA prox  stenosis with distal CTO=> high grade, focal  mid LSFA -> patent Pop & 2 V runoff in Peroneal & PTA. R SFA PTA - stent x 2(7 mm x 150 & 7 x 100 mm Eluvia Stent) to improve flow in graft. 09/11/2022: Left CFA Endarterectomy, Profundoplasty & Bovine Pericardial Patch Angioplasty, L SFA Endarterectomy w/ Bovine Patch Angioplasty =>  Plan for STAGED PTA/Stent of LSFA   Interval History:   Brenda Carney returns for an ~-month follow-up-essentially over a year out PCI.  She just had her right left sided leg PAD treated and is feeling much better.  Walking a whole lot better with significant RLE claudication with planned L leg procedure (LSFA) in 1 to 2 days.  This in combination with having had had bilateral hip Sgx recently -please ensure that her leg pain is much better.  She is now much more active.  Walking and even jogging some.:  No symptoms of this.  She jogs or walks very briskly.  No chest pain or pressure with rest or exertion. No PND, orthopnea or edema.  No arrhythmia symptoms.  Energy level seems to doing much better.  No headaches or blurred vision, no dizziness or lightheadedness. Still enjoys walking her dogs and riding horses.  Still smokes maybe 1 or 2 cigarettes a day almost as a crutch.  Not quite ready to give the last cigarette.  CV Review of Symptoms (Summary): : positive for - Only DOE if "jogging - not with routine walking) negative for - chest pain, edema, irregular heartbeat, orthopnea, palpitations, paroxysmal nocturnal dyspnea, rapid heart rate, shortness of breath, or syncope/near syncope; TIA/amaurosis fugax  REVIEWED OF SYSTEMS   Review of Systems  Constitutional:  Positive for weight loss. Negative for malaise/fatigue.  HENT:  Negative for congestion.   Respiratory:  Positive for cough (Chronic mild). Negative for sputum production, shortness of breath and wheezing.   Cardiovascular:        Per HPI  Gastrointestinal:  Negative for blood in stool and melena.   Genitourinary:  Positive for hematuria.  Musculoskeletal:  Negative for joint pain (Much less prominent than back pain).  Neurological:  Negative for dizziness and focal weakness.  Endo/Heme/Allergies:  Does not bruise/bleed easily.  Psychiatric/Behavioral:  Negative for depression and memory loss. The patient is not nervous/anxious and does not have insomnia.    I have reviewed and (if needed) personally updated the patient's problem list, medications, allergies, past medical and surgical history, social and family history.   PAST MEDICAL HISTORY   Past Medical History:  Diagnosis Date   Arthritis    "thumbs; hips" (05/14/2017)   CAD S/P 2 V PCI (pRCA, PCx-OM)    Coronary calcium scoring: June 2018: Score 1106. 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  Cervical spondylosis    Chronic lower back pain    COPD (chronic obstructive pulmonary disease) ([place])    "little bit" (05/14/2017)   Depression    Exercise-induced asthma with acute exacerbation    "only in very hot weather" (05/14/2017)   GERD (gastroesophageal reflux disease)    History of IBS    resovlved   Hx of multiple concussions    from horse training and riding   Hyperlipidemia with target LDL less than 70    Mostly statin intolerant,. 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   Pneumonia 2020   Sleep apnea    does not wear CPAP; "think it was medication related" (05/14/2017)   Stroke [facility]) 2010   TIA   Thoracic aortic ectasia (HCC)    ~40 mm on Coronary Calcium Score CT - recommend  f/u CTA or MRA   TIA (transient ischemic attack) 09/2011   "several"    PAST SURGICAL HISTORY   Past Surgical History:  Procedure Laterality Date   ABDOMINAL AORTOGRAM W/LOWER EXTREMITY N/A 05/14/2017   Procedure: Abdominal Aortogram w/Lower Extremity;  Surgeon: Conrad No Name, MD;  Location: [place] [facility];  Service: Cardiovascular;  Laterality: N/A;   ABDOMINAL AORTOGRAM W/LOWER EXTREMITY N/A 08/31/2022   Procedure: ABDOMINAL AORTOGRAM W/LOWER EXTREMITY;  Surgeon: [name], MD;  Location: [place] [facility];  Service: Cardiovascular;  Laterality: N/A;   BYPASS GRAFT POPLITEAL TO POPLITEAL Right 05/15/2017   Procedure: BYPASS GRAFT ABOVE KNEE POPLITEAL TO BELOW KNEE POPLITEAL ARTERY;  Surgeon: Conrad , MD;  Location: [place];  Service: Vascular;  Laterality: Right;   CARDIAC CATHETERIZATION     CORONARY STENT INTERVENTION N/A 09/30/2021   Procedure: CORONARY STENT INTERVENTION;  Surgeon: Leonie Man, MD;  Location: [place] [facility];  Service: Cardiovascular;  Laterality: N/A;   ENDARTERECTOMY FEMORAL Left 09/11/2022   Procedure: LEFT COMMON FEMORAL ENDARTERECTOMY;  Surgeon: [name], MD;  Location: [place];  Service: Vascular;  Laterality: Left;   INTRAVASCULAR ULTRASOUND/IVUS N/A 09/30/2021   Procedure: Intravascular Ultrasound/IVUS;  Surgeon: Leonie Man, MD;  Location: [place] [facility];  Service: Cardiovascular;  Laterality: N/A;   LEFT HEART CATH AND CORONARY ANGIOGRAPHY N/A 09/30/2021   Procedure: LEFT HEART CATH AND CORONARY ANGIOGRAPHY;  Surgeon: Leonie Man, MD;  Location: [place] [facility];  Service: Cardiovascular;  Laterality: N/A;   LOWER EXTREMITY ANGIOGRAPHY N/A 06/16/2020   Procedure: LOWER EXTREMITY ANGIOGRAPHY;  Surgeon: [name], MD;  Location: [place] [facility];  Service: Cardiovascular;  Laterality: N/A;   NM MYOVIEW LTD  06/2017    Normal EF, 66%. No ST changes. Normal study. LOW RISK.   PATCH ANGIOPLASTY  Left 09/11/2022   Procedure: LEFT FEMORAL PATCH ANGIOPLASTY USING XENOSURE BIOLOGIC PATCH 1CMX14CM;  Surgeon: [name], MD;  Location: [place];  Service: Vascular;  Laterality: Left;   PERIPHERAL VASCULAR ATHERECTOMY Left 06/16/2020   Procedure: PERIPHERAL VASCULAR ATHERECTOMY;  Surgeon: [name], MD;  Location: [place] [facility];  Service: Cardiovascular;  Laterality: Left;  SFA   PERIPHERAL VASCULAR INTERVENTION Right 08/31/2022   Procedure: PERIPHERAL VASCULAR INTERVENTION;  Surgeon: [name], MD;  Location: [place] [facility];  Service: Cardiovascular;  Laterality: Right;   TOTAL HIP ARTHROPLASTY Right 01/16/2020   Procedure:  RIGHT TOTAL HIP ARTHROPLASTY ANTERIOR APPROACH;  Surgeon: Mcarthur Rossetti, MD;  Location: [facility];  Service: Orthopedics;  Laterality: Right;   TOTAL HIP ARTHROPLASTY Left 04/30/2020   Procedure: LEFT TOTAL HIP ARTHROPLASTY ANTERIOR APPROACH;  Surgeon: Mcarthur Rossetti, MD;  Location: [facility];  Service: Orthopedics;  Laterality: Left;   TRANSTHORACIC ECHOCARDIOGRAM  09/07/2013   Done to evaluate TIA: Normal LV size and function. EF 55-60%. GR 1 DD. Mild left atrial dilation. Mildly calcified mitral leaflets. Otherwise normal.   Proximal LCx lesions (45, 80 and 50%): Onyx Frontier DES 3.0 x 34 mm - 3.3 mm.;  Proximal RCA lesions (30, 70 and 90%): Onyx Frontier DES 3.5 x 30 postdilated from 4.1 down to 3.7 mm..  Residual distal RCA 60%.  Mild diffuse LAD. Diagnostic:  Dominance: Right         Intervention  Immunization History  Administered Date(s) Administered   Influenza, High Dose Seasonal PF 09/29/2016, 10/03/2017, 10/08/2018, 10/08/2019   PFIZER(Purple Top)SARS-COV-2 Vaccination 01/30/2020, 02/24/2020   Pneumococcal Conjugate-13 10/03/2017   Pneumococcal Polysaccharide-23 09/29/2016   Td 10/08/2018   Tdap 08/18/2008   Zoster Recombinat (Shingrix) 10/16/2019   Zoster, Live 08/18/2013, 10/16/2019     MEDICATIONS/ALLERGIES   Current Meds  Medication Sig   albuterol (VENTOLIN HFA) 108 (90 Base) MCG/ACT inhaler Inhale 2 puffs into the lungs daily as needed for shortness of breath.   amLODipine (NORVASC) 10 MG tablet 1 tablet daily   Apoaequorin (PREVAGEN) 10 MG CAPS Take 10 mg by mouth daily.   ARIPiprazole (ABILIFY) 2 MG tablet Take 2 mg by mouth every morning.   Ascorbic Acid (VITAMIN C) 1000 MG tablet Take 1,000 mg by mouth 2 (two) times a week.   aspirin 81 MG chewable tablet 1 tablet   BINAXNOW COVID-19 AG HOME TEST KIT See admin instructions.   bisoprolol (ZEBETA) 5 MG tablet Take 1 tablet (5 mg total) by mouth daily.   Brimonidine Tartrate (LUMIFY) 0.025 % SOLN Place 1 drop into both eyes daily.   buPROPion (WELLBUTRIN XL) 150 MG 24 hr tablet Take 150 mg by mouth every morning.   Calcium Carbonate-Vitamin D (CALTRATE 600+D PO) Take 1 tablet by mouth daily.   clopidogrel (PLAVIX) 75 MG tablet TAKE ONE TABLET BY MOUTH DAILY   desvenlafaxine (PRISTIQ) 100 MG 24 hr tablet Take 100 mg by mouth daily.   diazepam (VALIUM) 10 MG tablet Take 5 mg by mouth at bedtime.   Estradiol 10 MCG TABS vaginal tablet Place 10 mcg vaginally once a week.    lisinopril (ZESTRIL) 10 MG tablet Take 10 mg by mouth daily.   Multiple Vitamins-Minerals (CENTRUM SILVER 50+WOMEN) TABS Take 1 tablet by mouth daily.   naphazoline-pheniramine (VISINE) 0.025-0.3 % ophthalmic solution Place 1 drop into both eyes 4 (four) times daily as needed (Dry eye).   Oxycodone HCl 10 MG TABS Take 1 tablet (10 mg total) by mouth 4 (four) times daily.   prazosin (MINIPRESS) 1 MG capsule Take 1 mg by mouth at bedtime.   Propylhexedrine (BENZEDREX NA) Place 1 spray into the nose 2 (two) times daily.   REPATHA SURECLICK 373 MG/ML SOAJ INJECT CONTENTS OF 1 PEN SUBCUTANEOUSLY EVERY 14 DAYS.   tiotropium (SPIRIVA HANDIHALER) 18 MCG inhalation capsule Place 18 mcg into inhaler and inhale daily as needed (Shortness of breath).    traZODone (DESYREL) 50 MG tablet Take 50 mg by mouth at bedtime.    Allergies  Allergen Reactions   Penicillins Swelling and Other (See Comments)  TONGUE SWELLING  PATIENT HAD A PCN REACTION WITH IMMEDIATE RASH, FACIAL/TONGUE/THROAT SWELLING, SOB, OR LIGHTHEADEDNESS WITH HYPOTENSION:  #  #  #  YES  #  #  #   Has patient had a PCN reaction causing severe rash involving mucus membranes or skin necrosis: No Has patient had a PCN reaction that required hospitalization: No Has patient had a PCN reaction occurring within the last 10 years: No    Nucynta Er [Tapentadol Hcl Er] Other (See Comments)    Dry mouth   Statins     muscle pain   Statins Support [A-G Pro] Other (See Comments)   Lyrica [Pregabalin] Other (See Comments)    DRY MOUTH    SOCIAL HISTORY/FAMILY HISTORY   Reviewed in Epic:  Pertinent findings:  Social History   Tobacco Use   Smoking status: Some Days    Packs/day: 0.00    Years: 36.00    Total pack years: 0.00    Types: Cigarettes    Passive exposure: Never   Smokeless tobacco: Never   Tobacco comments:    [name] says she smokes one cigarette every couple of weeks. [name] says she has a lot of friends who still smoke  Vaping Use   Vaping Use: Former  Substance Use Topics   Alcohol use: Yes    Alcohol/week: 2.0 standard drinks of alcohol    Types: 2 Glasses of wine per week    Comment: wine   Drug use: No   Social History   Social History Narrative   Patient lives Alone is a Widow Patient has no children.    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    Patient has BA degree.    Smokes 1 cigarette every couple of weeks.    OBJCTIVE -PE, EKG, labs   Wt Readings from Last 3 Encounters:  11/28/22 121 lb (54.9 kg)  11/27/22 125 lb 3.2 oz (56.8 kg)  10/10/22 126 lb 12.8 oz (57.5 kg)   Physical Exam: BP 134/78 (BP Location: Left Arm, Patient Position: Sitting, Cuff Size: Normal)   Pulse 68   Ht _0  (1.651 m)   Wt 121 lb 9.6 oz (55.2  kg)   SpO2 99%   BMI 20.24 kg/m  Physical Exam Vitals reviewed.  Constitutional:      General: She is not in acute distress.    Appearance: Normal appearance. She is normal weight. She is not ill-appearing or toxic-appearing.     Comments: Well-nourished, groomed.  HENT:     Head: Normocephalic and atraumatic.  Neck:     Vascular: No carotid bruit or JVD.  Cardiovascular:     Rate and Rhythm: Normal rate and regular rhythm. Occasional Extrasystoles are present.    Chest Wall: PMI is not displaced.     Pulses: Decreased pulses (Diminished DP pulses bilaterally, palpable PT pulses).     Heart sounds: S1 normal and S2 normal. No murmur heard.    No friction rub. No gallop.  Pulmonary:     Effort: Pulmonary effort is normal. No respiratory distress.     Breath sounds: Normal breath sounds. No wheezing, rhonchi or rales.  Chest:     Chest wall: No tenderness.  Musculoskeletal:        General: No swelling. Normal range of motion.     Cervical back: Normal range of motion and neck supple.  Skin:    General: Skin is warm and dry.  Neurological:  General: No focal deficit present.     Mental Status: She is alert and oriented to person, place, and time.     Gait: Gait normal.  Psychiatric:        Mood and Affect: Mood normal.        Behavior: Behavior normal.        Thought Content: Thought content normal.        Judgment: Judgment normal.     Adult ECG Report  Rate: 68 ;  Rhythm: normal sinus rhythm and premature atrial contractions (PAC); normal axis, intervals and durations.  Septal MI, age-indeterminate.  Narrative Interpretation: Reviewed.  Stable.  Recent Labs: Reviewed Lab Results  Component Value Date   CHOL 108 09/12/2022   HDL 44 09/12/2022   LDLCALC 36 09/12/2022   LDLDIRECT 186 (H) 03/30/2015   TRIG 140 09/12/2022   CHOLHDL 2.5 09/12/2022   Lab Results  Component Value Date   CREATININE 0.70 12/07/2022   BUN 7 (L) 12/07/2022   NA 139 12/07/2022   K 3.3  (L) 12/07/2022   CL 102 12/07/2022   CO2 23 09/12/2022      Latest Ref Rng & Units 12/07/2022    7:47 AM 09/12/2022    3:40 AM 09/11/2022    8:07 AM  CBC  WBC 4.0 - 10.5 K/uL  11.1  9.9   Hemoglobin 12.0 - 15.0 g/dL 15.3  10.7  14.0   Hematocrit 36.0 - 46.0 % 45.0  30.6  40.1   Platelets 150 - 400 K/uL  387  449     Lab Results  Component Value Date   HGBA1C 5.4 09/08/2021   Lab Results  Component Value Date   TSH 0.735 06/11/2013    ================================================== I spent a total of 33mnutes with the patient spent in direct patient consultation.  Additional time spent with chart review  / charting (studies, outside notes, etc): 16 min Total Time: 44 min  Current medicines are reviewed at length with the patient today.  (+/- concerns) N/A  Notice: This dictation was prepared with Dragon dictation along with smart phrase technology. Any transcriptional errors that result from this process are unintentional and may not be corrected upon review.  Studies Ordered:   Orders Placed This Encounter  Procedures   EKG 12-Lead   No orders of the defined types were placed in this encounter.   Patient Instructions / Medication Changes & Studies & Tests Ordered   Patient Instructions  Medication Instructions:   No t needed *If you need a refill on your cardiac medications before your next appointment, please call your pharmacy*   Lab Work: No changes If you don't have labs checked before visit - will order that day.  Testing/Procedures:  Not needed  Follow-Up: [facility] you and your health needs are our priority.  As part of our continuing mission to provide you with exceptional heart care, we have created designated Provider Care Teams.  These Care Teams include your primary Cardiologist (physician) and Advanced Practice Providers (APPs -  Physician Assistants and Nurse Practitioners) who all work together to provide you with the care you  need, when you need it.     Your next appointment:   6 month(s)  The format for your next appointment:   In Person  Provider:     or JColetta Memos FNP    Then, DGlenetta Hew MD will plan to see you again in 12 month(s).       DLeonie Green  Ellyn Hack, MD, MS Glenetta Hew, M.D., M.S. Interventional Cardiologist  [place]  Pager # [phone] Phone # [phone] [address] [place], [place] [address]   Thank you for choosing [facility]!!

## 2022-12-07 ENCOUNTER — Ambulatory Visit (HOSPITAL_COMMUNITY)
Admission: RE | Admit: 2022-12-07 | Discharge: 2022-12-07 | Disposition: A | Discharge status: Home / Self Care | Payer: Medicare Other | Attending: Vascular Surgery | Admitting: Vascular Surgery

## 2022-12-07 ENCOUNTER — Other Ambulatory Visit: Payer: Self-pay

## 2022-12-07 ENCOUNTER — Ambulatory Visit (HOSPITAL_COMMUNITY)
Admission: RE | Disposition: A | Discharge status: Home / Self Care | Payer: Self-pay | Source: Home / Self Care | Attending: Vascular Surgery

## 2022-12-07 DIAGNOSIS — F17210 Nicotine dependence, cigarettes, uncomplicated: Secondary | ICD-10-CM | POA: Diagnosis not present

## 2022-12-07 DIAGNOSIS — Z95828 Presence of other vascular implants and grafts: Secondary | ICD-10-CM | POA: Diagnosis not present

## 2022-12-07 DIAGNOSIS — I70211 Atherosclerosis of native arteries of extremities with intermittent claudication, right leg: Secondary | ICD-10-CM | POA: Insufficient documentation

## 2022-12-07 DIAGNOSIS — Z7902 Long term (current) use of antithrombotics/antiplatelets: Secondary | ICD-10-CM | POA: Diagnosis not present

## 2022-12-07 DIAGNOSIS — I10 Essential (primary) hypertension: Secondary | ICD-10-CM | POA: Insufficient documentation

## 2022-12-07 DIAGNOSIS — I739 Peripheral vascular disease, unspecified: Secondary | ICD-10-CM

## 2022-12-07 DIAGNOSIS — Z7982 Long term (current) use of aspirin: Secondary | ICD-10-CM | POA: Insufficient documentation

## 2022-12-07 DIAGNOSIS — I70212 Atherosclerosis of native arteries of extremities with intermittent claudication, left leg: Secondary | ICD-10-CM | POA: Diagnosis not present

## 2022-12-07 DIAGNOSIS — I771 Stricture of artery: Secondary | ICD-10-CM | POA: Diagnosis not present

## 2022-12-07 HISTORY — PX: PERIPHERAL VASCULAR INTERVENTION: CATH118257

## 2022-12-07 HISTORY — PX: PERIPHERAL VASCULAR ATHERECTOMY: CATH118256

## 2022-12-07 HISTORY — PX: ABDOMINAL AORTOGRAM W/LOWER EXTREMITY: CATH118223

## 2022-12-07 LAB — POCT ACTIVATED CLOTTING TIME
Activated Clotting Time: 212 seconds
Activated Clotting Time: 163 seconds

## 2022-12-07 LAB — POCT I-STAT, CHEM 8
BUN: 7 mg/dL — ABNORMAL LOW (ref 8–23)
Calcium, Ion: 1.1 mmol/L — ABNORMAL LOW (ref 1.15–1.40)
Chloride: 102 mmol/L (ref 98–111)
Creatinine, Ser: 0.7 mg/dL (ref 0.44–1.00)
Glucose, Bld: 123 mg/dL — ABNORMAL HIGH (ref 70–99)
HCT: 45 % (ref 36.0–46.0)
Hemoglobin: 15.3 g/dL — ABNORMAL HIGH (ref 12.0–15.0)
Potassium: 3.3 mmol/L — ABNORMAL LOW (ref 3.5–5.1)
Sodium: 139 mmol/L (ref 135–145)
TCO2: 24 mmol/L (ref 22–32)

## 2022-12-07 SURGERY — ABDOMINAL AORTOGRAM W/LOWER EXTREMITY
Anesthesia: LOCAL

## 2022-12-07 MED ORDER — SODIUM CHLORIDE 0.9 % IV SOLN: INTRAVENOUS | Status: DC

## 2022-12-07 MED ORDER — LIDOCAINE HCL (PF) 1 % IJ SOLN
INTRAMUSCULAR | Status: AC
  Filled 2022-12-07: qty 30

## 2022-12-07 MED ORDER — IODIXANOL 320 MG/ML IV SOLN
INTRAVENOUS | Status: DC | PRN
  Administered 2022-12-07: 115 mL via INTRA_ARTERIAL

## 2022-12-07 MED ORDER — HEPARIN (PORCINE) IN NACL 1000-0.9 UT/500ML-% IV SOLN
INTRAVENOUS | Status: AC
  Filled 2022-12-07: qty 1000

## 2022-12-07 MED ORDER — CLOPIDOGREL BISULFATE 75 MG PO TABS
ORAL_TABLET | ORAL | Status: AC
  Filled 2022-12-07: qty 1

## 2022-12-07 MED ORDER — MIDAZOLAM HCL 2 MG/2ML IJ SOLN
INTRAMUSCULAR | Status: AC
  Filled 2022-12-07: qty 2

## 2022-12-07 MED ORDER — HYDRALAZINE HCL 20 MG/ML IJ SOLN: 5.0000 mg | INTRAMUSCULAR | Status: DC | PRN

## 2022-12-07 MED ORDER — HEPARIN SODIUM (PORCINE) 1000 UNIT/ML IJ SOLN
INTRAMUSCULAR | Status: DC | PRN
  Administered 2022-12-07: 6000 [IU] via INTRAVENOUS

## 2022-12-07 MED ORDER — CLOPIDOGREL BISULFATE 75 MG PO TABS: 75.0000 mg | ORAL_TABLET | Freq: Every day | ORAL | Status: DC

## 2022-12-07 MED ORDER — HEPARIN SODIUM (PORCINE) 1000 UNIT/ML IJ SOLN
INTRAMUSCULAR | Status: AC
  Filled 2022-12-07: qty 10

## 2022-12-07 MED ORDER — FENTANYL CITRATE (PF) 100 MCG/2ML IJ SOLN
INTRAMUSCULAR | Status: AC
  Filled 2022-12-07: qty 2

## 2022-12-07 MED ORDER — MIDAZOLAM HCL 2 MG/2ML IJ SOLN
INTRAMUSCULAR | Status: DC | PRN
  Administered 2022-12-07: 1 mg via INTRAVENOUS

## 2022-12-07 MED ORDER — CLOPIDOGREL BISULFATE 75 MG PO TABS
ORAL_TABLET | ORAL | Status: DC | PRN
  Administered 2022-12-07: 75 mg via ORAL

## 2022-12-07 MED ORDER — FENTANYL CITRATE (PF) 100 MCG/2ML IJ SOLN
INTRAMUSCULAR | Status: DC | PRN
  Administered 2022-12-07: 25 ug via INTRAVENOUS

## 2022-12-07 MED ORDER — OXYCODONE HCL 5 MG PO TABS
ORAL_TABLET | ORAL | Status: AC
  Filled 2022-12-07: qty 1

## 2022-12-07 MED ORDER — OXYCODONE-ACETAMINOPHEN 5-325 MG PO TABS
ORAL_TABLET | ORAL | Status: AC
  Filled 2022-12-07: qty 1

## 2022-12-07 MED ORDER — SODIUM CHLORIDE 0.9 % IV SOLN: 250.0000 mL | INTRAVENOUS | Status: DC | PRN

## 2022-12-07 MED ORDER — ONDANSETRON HCL 4 MG/2ML IJ SOLN: 4.0000 mg | Freq: Four times a day (QID) | INTRAMUSCULAR | Status: DC | PRN

## 2022-12-07 MED ORDER — OXYCODONE-ACETAMINOPHEN 5-325 MG PO TABS
1.0000 | ORAL_TABLET | ORAL | Status: DC | PRN
  Administered 2022-12-07: 1 via ORAL

## 2022-12-07 MED ORDER — OXYCODONE HCL 5 MG PO TABS
5.0000 mg | ORAL_TABLET | ORAL | Status: DC | PRN
  Administered 2022-12-07: 5 mg via ORAL

## 2022-12-07 MED ORDER — SODIUM CHLORIDE 0.9% FLUSH: 3.0000 mL | INTRAVENOUS | Status: DC | PRN

## 2022-12-07 MED ORDER — LIDOCAINE HCL (PF) 1 % IJ SOLN
INTRAMUSCULAR | Status: DC | PRN
  Administered 2022-12-07: 5 mL via SUBCUTANEOUS

## 2022-12-07 MED ORDER — SODIUM CHLORIDE 0.9% FLUSH: 3.0000 mL | Freq: Two times a day (BID) | INTRAVENOUS | Status: DC

## 2022-12-07 MED ORDER — SODIUM CHLORIDE 0.9 % IV SOLN: INTRAVENOUS | Status: AC

## 2022-12-07 MED ORDER — LABETALOL HCL 5 MG/ML IV SOLN: 10.0000 mg | INTRAVENOUS | Status: DC | PRN

## 2022-12-07 MED ORDER — ACETAMINOPHEN 325 MG PO TABS: 650.0000 mg | ORAL_TABLET | ORAL | Status: DC | PRN

## 2022-12-07 SURGICAL SUPPLY — 25 items
BALLN MUSTANG 5.0X40 135 (BALLOONS) ×3
BALLN STERLING OTW 3X40X150 (BALLOONS) ×3
BALLOON MUSTANG 5.0X40 135 (BALLOONS) IMPLANT
BALLOON STERLING OTW 3X40X150 (BALLOONS) IMPLANT
CATH CXI SUPP ST 4FR 90CM (CATHETERS) IMPLANT
CATH NAVICROSS ST .035X90CM (MICROCATHETER) IMPLANT
CATH OMNI FLUSH 5F 65CM (CATHETERS) IMPLANT
CATH QUICKCROSS SUPP .018X90CM (MICROCATHETER) IMPLANT
CATH SHOCKWAVE 5.0X60 (CATHETERS) IMPLANT
CATH TEMPO 5F RIM 65CM (CATHETERS) IMPLANT
DEVICE TORQUE .025-.038 (MISCELLANEOUS) IMPLANT
GLIDEWIRE ADV .035X260CM (WIRE) IMPLANT
KIT ENCORE 26 ADVANTAGE (KITS) IMPLANT
KIT MICROPUNCTURE NIT STIFF (SHEATH) IMPLANT
KIT PV (KITS) ×3 IMPLANT
SHEATH PINNACLE 5F 10CM (SHEATH) IMPLANT
SHEATH PINNACLE MP 6F 45CM (SHEATH) IMPLANT
SHEATH PROBE COVER 6X72 (BAG) IMPLANT
STENT ELUVIA 6X40X130 (Permanent Stent) IMPLANT
SYR MEDRAD MARK V 150ML (SYRINGE) IMPLANT
TRANSDUCER W/STOPCOCK (MISCELLANEOUS) ×3 IMPLANT
TRAY PV CATH (CUSTOM PROCEDURE TRAY) ×3 IMPLANT
WIRE BENTSON .035X145CM (WIRE) IMPLANT
WIRE G V18X300CM (WIRE) IMPLANT
WIRE SPARTACORE .014X300CM (WIRE) IMPLANT

## 2022-12-07 NOTE — H&P (Signed)
History and Physical Interval Note:  12/07/2022 9:07 AM  Brenda Carney  has presented today for surgery, with the diagnosis of PAD.  The various methods of treatment have been discussed with the patient and family. After consideration of risks, benefits and other options for treatment, the patient has consented to  Procedure(s): ABDOMINAL AORTOGRAM W/LOWER EXTREMITY (N/A) as a surgical intervention.  The patient's history has been reviewed, patient examined, no change in status, stable for surgery.  I have reviewed the patient's chart and labs.  Questions were answered to the patient's satisfaction.     [facility] HISTORY AND PHYSICAL    History of Present Illness:  Patient is a 71 y.o. year old female who presents for evaluation of claudication.  She is s/p left common femoral endarterectomy with a separate left SFA endarterectomy with bovine patch on 09/11/2022 for lifestyle-limiting claudication. She also underwent a right SFA angioplasty with stent placement x2 on 08/31/2022 for recurrent high-grade stenosis in the SFA proximal to a remote popliteal bypass performed by Dr. Bridgett Larsson.              She denies claudication, rest pain or loss of sensation.     The pt is not on a statin for cholesterol management due to intolerance The pt is on a daily aspirin.   Other AC:  Plavix The pt is on CCB, BB, ACE for hypertension.   The pt is not diabetic.  Tobacco hx:  current         Past Medical History:  Diagnosis Date   Arthritis      "thumbs; hips" (05/14/2017)   Cerebral aneurysm      Right ophthalmic artery, left callosal marginal anterior cerebral artery branch   Cerebrovascular disease      Carotid dopplers which showed no significant increase in velocities, extremely minimal on the left, antegrade vertebral bilaterally.   Cervical spondylosis     Chronic lower back pain     COPD (chronic obstructive pulmonary disease) (HCC)       "little bit" (05/14/2017)   Coronary artery disease, non-occlusive      Coronary calcium scoring: June 2018: Score 1106. 98 percentile for age -> LOW RISK MYOVIEW   Depression     Exercise-induced asthma with acute exacerbation      "only in very hot weather" (05/14/2017)   GERD (gastroesophageal reflux disease)     History of IBS      resovlved   Hx of multiple concussions      from horse training and riding   Hyperlipidemia with target LDL less than 70      Mostly statin intolerant,. Currently on Livalo 4 mg   Hypertension, benign     Lumbosacral spondylosis     PAD (peripheral artery disease) (HCC)      Status post right above-the-knee-below the knee popliteal artery bypass; postop right ABI 0.96.   Pneumonia 2020   Sleep apnea      does not wear CPAP; "think it was medication related" (05/14/2017)   Stroke [facility]) 2010    TIA   Thoracic aortic ectasia (HCC)      ~40 mm on Coronary Calcium Score CT - recommend f/u CTA or MRA   TIA (transient ischemic attack) 09/2011    "several"           Past Surgical History:  Procedure Laterality Date   ABDOMINAL AORTOGRAM W/LOWER EXTREMITY N/A 05/14/2017  Procedure: Abdominal Aortogram w/Lower Extremity;  Surgeon: Conrad Lyons, MD;  Location: [place] [facility];  Service: Cardiovascular;  Laterality: N/A;   ABDOMINAL AORTOGRAM W/LOWER EXTREMITY N/A 08/31/2022    Procedure: ABDOMINAL AORTOGRAM W/LOWER EXTREMITY;  Surgeon: [name], MD;  Location: [place] [facility];  Service: Cardiovascular;  Laterality: N/A;   ACROMIO-CLAVICULAR JOINT REPAIR Left 1990s    "I think it was called an [facility] joint removal"   BACK SURGERY       BYPASS GRAFT POPLITEAL TO POPLITEAL Right 05/15/2017    Procedure: BYPASS GRAFT ABOVE KNEE POPLITEAL TO BELOW KNEE POPLITEAL ARTERY;  Surgeon: Conrad Woodbury, MD;  Location: [place];  Service: Vascular;  Laterality: Right;   CARDIAC CATHETERIZATION       CORONARY STENT INTERVENTION N/A 09/30/2021    Procedure:  CORONARY STENT INTERVENTION;  Surgeon: Leonie Man, MD;  Location: [place] [facility];  Service: Cardiovascular;  Laterality: N/A;   ENDARTERECTOMY FEMORAL Left 09/11/2022    Procedure: LEFT COMMON FEMORAL ENDARTERECTOMY;  Surgeon: [name], MD;  Location: [facility];  Service: Vascular;  Laterality: Left;   ESOPHAGOGASTRODUODENOSCOPY (EGD) WITH PROPOFOL N/A 04/29/2018    Procedure: ESOPHAGOGASTRODUODENOSCOPY (EGD) WITH PROPOFOL;  Surgeon: Otis Brace, MD;  Location: [place];  Service: Gastroenterology;  Laterality: N/A;   FOOT FRACTURE SURGERY Bilateral      multiple procedures   FRACTURE SURGERY       INTRAVASCULAR ULTRASOUND/IVUS N/A 09/30/2021    Procedure: Intravascular Ultrasound/IVUS;  Surgeon: Leonie Man, MD;  Location: [place] [facility];  Service: Cardiovascular;  Laterality: N/A;   JOINT REPLACEMENT       LEFT HEART CATH AND CORONARY ANGIOGRAPHY N/A 09/30/2021    Procedure: LEFT HEART CATH AND CORONARY ANGIOGRAPHY;  Surgeon: Leonie Man, MD;  Location: [place] [facility];  Service: Cardiovascular;  Laterality: N/A;   LOWER EXTREMITY ANGIOGRAPHY N/A 06/16/2020    Procedure: LOWER EXTREMITY ANGIOGRAPHY;  Surgeon: [name], MD;  Location: [place] [facility];  Service: Cardiovascular;  Laterality: N/A;   MAXIMUM ACCESS (MAS)POSTERIOR LUMBAR INTERBODY FUSION (PLIF) 1 LEVEL N/A 09/30/2014    Procedure: FOR MAXIMUM ACCESS (MAS) POSTERIOR LUMBAR INTERBODY FUSION (PLIF) 1 LEVEL;  Surgeon: Eustace Moore, MD;  Location: [place] [facility];  Service: Neurosurgery;  Laterality: N/A;  FOR MAXIMUM ACCESS (MAS) POSTERIOR LUMBAR INTERBODY FUSION (PLIF) 1 LEVEL LUMBAR 4-5   MULTIPLE TOOTH EXTRACTIONS Right 04/2017   NM MYOVIEW LTD   06/2017     Normal EF, 66%. No ST changes. Normal study. LOW RISK.   PATCH ANGIOPLASTY Left 09/11/2022    Procedure: LEFT FEMORAL PATCH ANGIOPLASTY USING XENOSURE BIOLOGIC PATCH 1CMX14CM;  Surgeon: [name], MD;  Location:  [place];  Service: Vascular;  Laterality: Left;   PERIPHERAL VASCULAR ATHERECTOMY Left 06/16/2020    Procedure: PERIPHERAL VASCULAR ATHERECTOMY;  Surgeon: [name], MD;  Location: [place] [facility];  Service: Cardiovascular;  Laterality: Left;  SFA   PERIPHERAL VASCULAR INTERVENTION Right 08/31/2022    Procedure: PERIPHERAL VASCULAR INTERVENTION;  Surgeon: [name], MD;  Location: [place] [facility];  Service: Cardiovascular;  Laterality: Right;   TOTAL HIP ARTHROPLASTY Right 01/16/2020    Procedure: RIGHT TOTAL HIP ARTHROPLASTY ANTERIOR APPROACH;  Surgeon: Mcarthur Rossetti, MD;  Location: [facility];  Service: Orthopedics;  Laterality: Right;   TOTAL HIP ARTHROPLASTY Left 04/30/2020    Procedure: LEFT TOTAL HIP ARTHROPLASTY ANTERIOR APPROACH;  Surgeon: Mcarthur Rossetti, MD;  Location: [facility];  Service: Orthopedics;  Laterality: Left;   TRANSTHORACIC ECHOCARDIOGRAM   09/07/2013    Done to evaluate TIA: Normal LV size and function. EF 55-60%. GR 1 DD. Mild left atrial dilation. Mildly calcified mitral leaflets. Otherwise normal.      ROS:    General:  No weight loss, Fever, chills   HEENT: No recent headaches, no nasal bleeding, no visual changes, no sore throat   Neurologic: No dizziness, blackouts, seizures. No recent symptoms of stroke or mini- stroke. No recent episodes of slurred speech, or temporary blindness.   Cardiac: No recent episodes of chest pain/pressure, no shortness of breath at rest.  No shortness of breath with exertion.  Denies history of atrial fibrillation or irregular heartbeat   Vascular: No history of rest pain in feet.  No history of claudication.  No history of non-healing ulcer, No history of DVT    Pulmonary: No home oxygen, no productive cough, no hemoptysis,  No asthma or wheezing   Musculoskeletal:  _0  Arthritis, _1  Low back pain,  _2  Joint pain   Hematologic:No history of hypercoagulable state.  No history of easy bleeding.   No history of anemia   Gastrointestinal: No hematochezia or melena,  No gastroesophageal reflux, no trouble swallowing   Urinary: _3  chronic Kidney disease, _4  on HD - _5  MWF or _6  TTHS, _7  Burning with urination, _8  Frequent urination, _9  Difficulty urinating;    Skin: No rashes   Psychological: No history of anxiety,  No history of depression   Social History Social History         Tobacco Use   Smoking status: Some Days      Packs/day: 0.00      Years: 36.00      Total pack years: 0.00      Types: Cigarettes      Passive exposure: Never   Smokeless tobacco: Never   Tobacco comments:      Brenda Carney says she smokes one cigarette every couple of weeks. Brenda Carney says she has a lot of friends who still smoke  Vaping Use   Vaping Use: Former  Substance Use Topics   Alcohol use: Yes      Alcohol/week: 2.0 standard drinks of alcohol      Types: 2 Glasses of wine per week      Comment: wine   Drug use: No      Family History      Family History  Problem Relation Age of Onset   Stroke Father     Heart attack Mother     Stroke Mother        Allergies        Allergies  Allergen Reactions   Penicillins Swelling and Other (See Comments)      TONGUE SWELLING   PATIENT HAD A PCN REACTION WITH IMMEDIATE RASH, FACIAL/TONGUE/THROAT SWELLING, SOB, OR LIGHTHEADEDNESS WITH HYPOTENSION:  #  #  #  YES  #  #  #   Has patient had a PCN reaction causing severe rash involving mucus membranes or skin necrosis: No Has patient had a PCN reaction that required hospitalization: No Has patient had a PCN reaction occurring within the last 10 years: No     Nucynta Er [Tapentadol Hcl Er] Other (See Comments)      Unknown   Statins        muscle pain   Statins Support [A-G Pro] Other (See  Comments)   Lyrica [Pregabalin] Other (See Comments)      DRY MOUTH              Current Outpatient Medications  Medication Sig Dispense Refill   albuterol (VENTOLIN HFA) 108 (90 Base) MCG/ACT inhaler  Inhale 2 puffs into the lungs daily as needed for shortness of breath.       amLODipine (NORVASC) 10 MG tablet 1 tablet       Apoaequorin (PREVAGEN) 10 MG CAPS Take 10 mg by mouth daily.       ARIPiprazole (ABILIFY) 2 MG tablet Take 2 mg by mouth every morning.       Ascorbic Acid (VITAMIN C) 1000 MG tablet Take 1,000 mg by mouth 2 (two) times a week.       aspirin 81 MG chewable tablet 1 tablet       BINAXNOW COVID-19 AG HOME TEST KIT See admin instructions.       bisoprolol (ZEBETA) 5 MG tablet Take 1 tablet (5 mg total) by mouth daily. 90 tablet 3   Brimonidine Tartrate (LUMIFY) 0.025 % SOLN Place 1 drop into both eyes daily.       buPROPion (WELLBUTRIN XL) 150 MG 24 hr tablet Take 150 mg by mouth every morning.       Calcium Carbonate-Vitamin D (CALTRATE 600+D PO) Take 1 tablet by mouth daily.       clopidogrel (PLAVIX) 75 MG tablet TAKE ONE TABLET BY MOUTH DAILY 90 tablet 2   desvenlafaxine (PRISTIQ) 100 MG 24 hr tablet Take 100 mg by mouth daily.       diazepam (VALIUM) 10 MG tablet Take 5 mg by mouth at bedtime.       Estradiol 10 MCG TABS vaginal tablet Place 10 mcg vaginally once a week.        Evolocumab (REPATHA) 140 MG/ML SOSY Inject 140 mg into the skin every 14 (fourteen) days. 2.1 mL 3   lisinopril (ZESTRIL) 10 MG tablet Take 10 mg by mouth daily.       Multiple Vitamins-Minerals (CENTRUM SILVER 50+WOMEN) TABS Take 1 tablet by mouth daily.       naphazoline-pheniramine (VISINE) 0.025-0.3 % ophthalmic solution Place 1 drop into both eyes 4 (four) times daily as needed (Dry eye).       Oxycodone HCl 10 MG TABS Take 1 tablet (10 mg total) by mouth 4 (four) times daily. 20 tablet 0   oxyCODONE-acetaminophen (PERCOCET) 10-325 MG tablet Take 1 tablet by mouth 4 (four) times daily as needed.       prazosin (MINIPRESS) 1 MG capsule Take 1 mg by mouth at bedtime.       Propylhexedrine (BENZEDREX NA) Place 1 spray into the nose 2 (two) times daily.       REPATHA SURECLICK 765 MG/ML SOAJ  INJECT CONTENTS OF 1 PEN SUBCUTANEOUSLY EVERY 14 DAYS. 2 mL 11   tiotropium (SPIRIVA HANDIHALER) 18 MCG inhalation capsule Place 18 mcg into inhaler and inhale daily as needed (Shortness of breath).       traZODone (DESYREL) 50 MG tablet Take 50 mg by mouth at bedtime.       zolpidem (AMBIEN) 10 MG tablet Take 15-20 mg by mouth at bedtime.        No current facility-administered medications for this visit.      Physical Examination      Vitals:    11/28/22 1248  BP: (!) 136/92  Pulse: 79  Resp: 20  Temp: 98 F (36.7  C)  TempSrc: Temporal  SpO2: 96%  Weight: 121 lb (54.9 kg)  Height: _0  (1.651 m)      Body mass index is 20.14 kg/m.   General:  Alert and oriented, no acute distress HEENT: Normal Neck: No bruit or JVD Pulmonary: Clear to auscultation bilaterally Cardiac: Regular Rate and Rhythm without murmur Abdomen: Soft, non-tender, non-distended, no mass, no scars Skin: No rash Extremity Pulses:femoral, not dorsalis pedis, right posterior tibial pulse palpable  Musculoskeletal: No deformity or edema      Neurologic: Upper and lower extremity motor 5/5 and symmetric   DATA:  +-----------+--------+-----+---------------+-------------------+--------+  LEFT      PSV cm/sRatioStenosis       Waveform           Comments  +-----------+--------+-----+---------------+-------------------+--------+  DFA       16                          dampened monophasic          +-----------+--------+-----+---------------+-------------------+--------+  SFA Prox   76                          monophasic                   +-----------+--------+-----+---------------+-------------------+--------+  SFA Mid    417          75-99% stenosismonophasic                   +-----------+--------+-----+---------------+-------------------+--------+  SFA Distal 53                          monophasic                    +-----------+--------+-----+---------------+-------------------+--------+  POP Prox   37                          monophasic                   +-----------+--------+-----+---------------+-------------------+--------+  POP Distal 33                          monophasic                   +-----------+--------+-----+---------------+-------------------+--------+  ATA Distal 15                          monophasic                   +-----------+--------+-----+---------------+-------------------+--------+  PTA Distal 21                          monophasic                   +-----------+--------+-----+---------------+-------------------+--------+  PERO Distal16                          monophasic                   +-----------+--------+-----+---------------+-------------------+--------+      Summary:  Left: Patent CFA and SFA endarterectomy site.  75-99% stenosis mid SFA.    ABI Findings:  +---------+------------------+-----+--------+--------+  Right   Rt Pressure (mmHg)IndexWaveformComment   +---------+------------------+-----+--------+--------+  Brachial 145                                      +---------+------------------+-----+--------+--------+  PTA     135               0.93 biphasic          +---------+------------------+-----+--------+--------+  DP      124               0.86 biphasic          +---------+------------------+-----+--------+--------+  Great Toe43                0.30                   +---------+------------------+-----+--------+--------+   +---------+------------------+-----+----------+-------+  Left    Lt Pressure (mmHg)IndexWaveform  Comment  +---------+------------------+-----+----------+-------+  Brachial 131                                       +---------+------------------+-----+----------+-------+  PTA     122               0.84 monophasic          +---------+------------------+-----+----------+-------+  DP      111               0.77 monophasic         +---------+------------------+-----+----------+-------+  Great Toe0                 0.00                    +---------+------------------+-----+----------+-------+   +-------+-----------+-----------+------------+------------+  ABI/TBIToday's ABIToday's TBIPrevious ABIPrevious TBI  +-------+-----------+-----------+------------+------------+  Right 0.93       0.3                                  +-------+-----------+-----------+------------+------------+  Left  0.84       0                                    +-------+-----------+-----------+------------+------------+   Previous ABI on 08/21/22 prior to intervention.    Summary:  Right: Resting right ankle-brachial index indicates mild right lower  extremity arterial disease. The right toe-brachial index is abnormal.   Left: Resting left ankle-brachial index indicates mild left lower  extremity arterial disease. The left toe-brachial index is abnormal.      ASSESSMENT/PLAN:  Patient is a 71 y.o. year old female who presents for evaluation of claudication.  She is s/p left common femoral endarterectomy with a separate left SFA endarterectomy with bovine patch on 09/11/2022 for lifestyle-limiting claudication. She also underwent a right SFA angioplasty with stent placement x2 on 08/31/2022 for recurrent high-grade stenosis in the SFA proximal to a remote popliteal bypass performed by Dr. Bridgett Larsson.             She is asymptomatic for ischemia of B LE.  The left LE duplex demonstrates decreased inflow with a dampened monophasic and PSV of 16.  75-99% stenosis mid SFA.  Her ABI's left 0.77 monophasic 08/16/22 and now 0.83 monophasic and stable.  The inflow has decreased with PSV of 16 at the inflow needs further work up.  I have scheduled her for angiogram with runoff and possible intervention of the left  LE to improve inflow and left LE arterial perfusion.                           Roxy Horseman PA-C [facility] Office: [phone]

## 2022-12-07 NOTE — Progress Notes (Signed)
Site area- right  Site Prior to Removal- 0   Pressure Applied For-  15 MInutes   Bedrest Beginning at - 1300   Manual- Yes   Patient Status During Pull- Stable    Post Pull Groin Site- 0   Post Pull Instructions Given- Yes   Post Pull Pulses Present- Yes    Dressing Applied- Tegaderm and Gauze Dressing    Comments:  Long sheath removed from right groin. Patient tolerated well. Manual hold for 15 minutes.  Tegaderm dressing, level 0. Instructions given and patient understands. Bedrest for 4 hours, beginning at 1300 hrs. tdh

## 2022-12-07 NOTE — Op Note (Signed)
Patient name: Brenda Carney MRN: [id number] DOB: [birth year] Sex: female  12/07/2022 Pre-operative Diagnosis: High-grade left SFA stenosis greater than 80% with left leg claudication after recent femoral endarterectomy Post-operative diagnosis:  Same Surgeon:  [name], MD Procedure Performed: 1.  Ultrasound guided access right common femoral artery 2.  Aortogram with catheter selection of aorta 3.  Left lower extremity arteriogram with selection of third order branches 4.  Left SFA balloon lithotripsy (shockwave with 5 mm x 60 mm angioplasty balloon with 90 pulses) 5.  Stent of left SFA with a 6 mm x 40 mm Eluvia postdilated with a 5 mm Mustang) 6.  60 minutes of monitored moderate conscious sedation time  Indications: 71 year old female that recently underwent a left common femoral and separate SFA endarterectomy with bovine patch for lifestyle-limiting claudication.  Surveillance imaging showed sluggish flow in the patch with a high-grade SFA stenosis.  She still complains of some intermittent claudication in the left leg.  She presents today for aortogram, lower extremity arteriogram with possible intervention with a focus on the left leg.  Findings:   Aortogram showed diffusely diseased arteries without flow-limiting stenosis.  The left common femoral and proximal SFA endarterectomy were widely patent with no stenosis.  She has a diffusely diseased profunda.  The left SFA was patent with a high-grade 99% stenosis in the mid SFA.  She has three-vessel runoff distally with dominant runoff in the posterior tibial.  The left SFA stenosis was crossed from contralateral groin access and I initially performed shockwave lithotripsy with a 5 mm x 60 mm shockwave balloon with a total of 90 pulses.  I then treated this with a 6 mm x 40 mm Eluvia stent due to a dissection with some remnant stenosis greater than 30%.  Widely patent stent at completion with three-vessel runoff  distally.   Procedure:  The patient was identified in the holding area and taken to room 8.  The patient was then placed supine on the table and prepped and draped in the usual sterile fashion.  A time out was called.  Patient received Versed and fentanyl for moderate conscious sedation.  I was present for all of moderate sedation.  Vital signs were monitored including heart rate, respiratory rate, oxygenation and blood pressure.  Ultrasound was used to evaluate the right common femoral artery.  It was patent .  A digital ultrasound image was acquired.  A micropuncture needle was used to access the right common femoral artery under ultrasound guidance.  An 018 wire was advanced without resistance and a micropuncture sheath was placed.  The 018 wire was removed and a benson wire was placed.  The micropuncture sheath was exchanged for a 5 french sheath.  An omniflush catheter was advanced over the wire to the level of L-1.  An abdominal angiogram was obtained.  Next, using the omniflush catheter and a benson wire, the aortic bifurcation was crossed and the catheter was placed into theleft external iliac artery and left runoff was obtained.  Ultimately after evaluating images we elected for left leg intervention.  I used a Glidewire advantage down the left SFA and exchanged for a long 6 French sheath in the right groin over the aortic bifurcation.  Patient was given 100 units/kg IV heparin.  I then used a CXI 035 catheter to trying cross the left SFA high grade stenosis.  I had trouble and was in a dissection.  Ultimately I used an 018 wire and finally was able  to cross the calcified high-grade stenosis and get in the true lumen distally.  I then used an 018 quick cross and downsized to an 014 wire and then used a 5 mm x 60 mm shockwave lithotripsy balloon and perform shockwave of the SFA lesion at 4 atm with a total of 90 pulses.  I shot a picture and there was a much better lumen however there was greater than 30%  residual stenosis with a dissection and I ultimately elected to stent this with a 6 mm x 40 mm drug-coated Eluvia postdilated with a 5 mm Mustang.  Excellent results.  Widely patent stent.  Three-vessel runoff distally.  The sheath was pulled over the aortic bifurcation.  Taken to holding to have the sheath removed after wires and catheters removed.   Plan: Excellent results after shockwave and stenting of her left SFA 99% stenosis.  Will arrange follow-up in 1 month with noninvasive imaging.  She needs to continue aspirin Plavix.  [name], MD [facility] [place] Office: [phone]

## 2022-12-08 ENCOUNTER — Encounter (HOSPITAL_COMMUNITY): Payer: Self-pay | Admitting: Vascular Surgery

## 2022-12-08 MED FILL — Heparin Sod (Porcine)-NaCl IV Soln 1000 Unit/500ML-0.9%: INTRAVENOUS | Qty: 1000 | Status: AC

## 2022-12-15 ENCOUNTER — Telehealth: Payer: Self-pay | Admitting: Vascular Surgery

## 2022-12-15 NOTE — Telephone Encounter (Signed)
-----   Message from [name], MD sent at 12/07/2022 11:16 AM EST -----  12/07/2022 Pre-operative Diagnosis: High-grade left SFA stenosis greater than 80% with left leg claudication after recent femoral endarterectomy Post-operative diagnosis:  Same Surgeon:  [name], MD Procedure Performed: 1.  Ultrasound guided access right common femoral artery 2.  Aortogram with catheter selection of aorta 3.  Left lower extremity arteriogram with selection of third order branches 4.  Left SFA balloon lithotripsy (shockwave with 5 mm x 60 mm angioplasty balloon with 90 pulses) 5.  Stent of left SFA with a 6 mm x 40 mm Eluvia postdilated with a 5 mm Mustang) 6.  60 minutes of monitored moderate conscious sedation time   #Can you arrange follow-up in one month with left leg arterial duplex and ABI?  Thanks,  Gerald Stabs

## 2022-12-20 ENCOUNTER — Encounter (HOSPITAL_COMMUNITY): Payer: Self-pay | Admitting: Vascular Surgery

## 2022-12-21 NOTE — Telephone Encounter (Signed)
Appt has been scheduled.

## 2022-12-25 ENCOUNTER — Encounter: Payer: Self-pay | Admitting: Cardiology

## 2022-12-25 DIAGNOSIS — G720 Drug-induced myopathy: Secondary | ICD-10-CM | POA: Insufficient documentation

## 2022-12-25 NOTE — Assessment & Plan Note (Signed)
After trying multiple statins, intolerant.  Has been transitioned to [place] with well-controlled lipids.

## 2022-12-25 NOTE — Assessment & Plan Note (Signed)
She is close to quitting.  Down to 1 or 2 cigarettes a day, just not quite ready to make that final step.  Hopefully once all the stress of her procedures is gone, she will be able to find another crutch.

## 2022-12-25 NOTE — Assessment & Plan Note (Addendum)
Followed by vascular surgery.  Has pending completion of revascularization with staged intervention to the left SFA.  Already doing better with walking.  Minimal residual claudication.  Needs about smoking cessation counseling every time she seen. Continue close blood pressure, glycemic and lipid control. Continue to walk, active exercise.

## 2022-12-25 NOTE — Assessment & Plan Note (Addendum)
Status post PCI of the LCx-OM1 and RCA with essential resolution of anginal chest pain. Thankfully, no residual symptoms.  Doing very well.  Has been having issues with PAD that now in the process of being treated. With no active cardiac symptoms, no need for any preop evaluation due to revascularization within 5 years.  Aggressive risk factor modification. Continue low-dose beta-blocker and high-dose amlodipine.  (Anginal benefit.) Continue lisinopril rectal reduction Statin myopathy-on Repatha.  Lipids well-controlled. On aspirin and Plavix maintenance-mostly now for PAD.  From a cardiac standpoint would be okay to hold 5 to 7 days preop for surgeries or procedures, but from a PAD standpoint would like to probably wait through June 2024 before holding.   Defer to vascular surgery.

## 2022-12-25 NOTE — Assessment & Plan Note (Signed)
She is on 10 mg of amlodipine and 10 mg of lisinopril along with 5 mg Zebeta.  BP is borderline today, we still have room to increase ACE inhibitor as well as potentially the Zebeta.

## 2022-12-25 NOTE — Assessment & Plan Note (Signed)
Significant statin myopathy-now on Repatha.  Most recent lipids showed significant improvement of LDL down to 36.

## 2022-12-26 DIAGNOSIS — M5116 Intervertebral disc disorders with radiculopathy, lumbar region: Secondary | ICD-10-CM | POA: Diagnosis not present

## 2022-12-26 DIAGNOSIS — Z79891 Long term (current) use of opiate analgesic: Secondary | ICD-10-CM | POA: Diagnosis not present

## 2022-12-26 DIAGNOSIS — M545 Low back pain, unspecified: Secondary | ICD-10-CM | POA: Diagnosis not present

## 2022-12-29 ENCOUNTER — Other Ambulatory Visit: Payer: Self-pay | Admitting: *Deleted

## 2022-12-29 DIAGNOSIS — I739 Peripheral vascular disease, unspecified: Secondary | ICD-10-CM

## 2022-12-29 DIAGNOSIS — I70202 Unspecified atherosclerosis of native arteries of extremities, left leg: Secondary | ICD-10-CM

## 2023-01-02 ENCOUNTER — Encounter: Payer: Self-pay | Admitting: Pulmonary Disease

## 2023-01-09 ENCOUNTER — Encounter: Payer: Self-pay | Admitting: Pulmonary Disease

## 2023-01-09 ENCOUNTER — Ambulatory Visit (HOSPITAL_COMMUNITY)
Admission: RE | Admit: 2023-01-09 | Discharge: 2023-01-09 | Disposition: A | Discharge status: Home / Self Care | Payer: Medicare Other | Source: Ambulatory Visit | Attending: Vascular Surgery | Admitting: Vascular Surgery

## 2023-01-09 ENCOUNTER — Ambulatory Visit (INDEPENDENT_AMBULATORY_CARE_PROVIDER_SITE_OTHER)
Admission: RE | Admit: 2023-01-09 | Discharge: 2023-01-09 | Disposition: A | Discharge status: Home / Self Care | Payer: Medicare Other | Source: Ambulatory Visit | Attending: Vascular Surgery | Admitting: Vascular Surgery

## 2023-01-09 ENCOUNTER — Ambulatory Visit (INDEPENDENT_AMBULATORY_CARE_PROVIDER_SITE_OTHER): Payer: Medicare Other | Admitting: Physician Assistant

## 2023-01-09 VITALS — BP 141/82 | HR 63 | Temp 98.0°F | Resp 20 | Ht 65.0 in | Wt 126.0 lb

## 2023-01-09 DIAGNOSIS — I70202 Unspecified atherosclerosis of native arteries of extremities, left leg: Secondary | ICD-10-CM | POA: Diagnosis not present

## 2023-01-09 DIAGNOSIS — I739 Peripheral vascular disease, unspecified: Secondary | ICD-10-CM | POA: Insufficient documentation

## 2023-01-09 LAB — VAS US ABI WITH/WO TBI
Left ABI: 0.9
Right ABI: 0.94

## 2023-01-09 NOTE — Progress Notes (Signed)
Established PAD   History of Present Illness   Brenda Carney is a 72 y.o. (27-Aug-1951) female who presents for PAD follow up.  She has a history of right popliteal bypass by Dr. Bridgett Larsson in 2018.  She underwent right SFA angioplasty and stent placement x 2 on 08/31/2022 for recurrent high-grade stenosis in the SFA proximal to her bypass. She then underwent left common femoral and SFA endarterectomy on 09/11/2022 for lifestyle limiting claudication.  Most recently she required left SFA lithotripsy and stenting on 12/07/2022 by Dr. Carlis Abbott to treat continued left lower extremity intermittent claudication.  She returns today for follow-up s/p stenting of the left SFA.  She states that the claudication in both of her legs has greatly improved since we saw her in December.  She is able to walk at a moderate pace for 30 minutes until she experiences some claudication in both calves, L>R.  She denies any rest pain or nonhealing wounds of the lower extremities.  She is still taking her aspirin and Plavix.  Current Outpatient Medications  Medication Sig Dispense Refill   albuterol (VENTOLIN HFA) 108 (90 Base) MCG/ACT inhaler Inhale 2 puffs into the lungs daily as needed for shortness of breath.     amLODipine (NORVASC) 10 MG tablet Take 10 mg by mouth in the morning.     Apoaequorin (PREVAGEN PO) Take 1 capsule by mouth 3 (three) times a week.     ARIPiprazole (ABILIFY) 2 MG tablet Take 2 mg by mouth every morning.     Ascorbic Acid (VITAMIN C) 1000 MG tablet Take 1,000 mg by mouth 3 (three) times a week.     aspirin EC 81 MG tablet Take 81 mg by mouth in the morning.     bisoprolol (ZEBETA) 5 MG tablet Take 1 tablet (5 mg total) by mouth daily. 90 tablet 3   Brimonidine Tartrate (LUMIFY) 0.025 % SOLN Place 1 drop into both eyes in the morning.     buPROPion (WELLBUTRIN XL) 150 MG 24 hr tablet Take 150 mg by mouth every morning.     Calcium Carbonate-Vitamin D (CALTRATE 600+D PO) Take 1 tablet by mouth 2  (two) times a week.     clopidogrel (PLAVIX) 75 MG tablet TAKE ONE TABLET BY MOUTH DAILY 90 tablet 2   desvenlafaxine (PRISTIQ) 100 MG 24 hr tablet Take 100 mg by mouth in the morning.     diazepam (VALIUM) 10 MG tablet Take 5-10 mg by mouth at bedtime as needed for sleep or anxiety.     Evolocumab (REPATHA) 140 MG/ML SOSY Inject 140 mg into the skin every 14 (fourteen) days. 2.1 mL 3   lisinopril (ZESTRIL) 10 MG tablet Take 10 mg by mouth in the morning.     Multiple Vitamins-Minerals (CENTRUM SILVER 50+WOMEN) TABS Take 1 tablet by mouth 4 (four) times a week. In the morning.     naphazoline-pheniramine (VISINE) 0.025-0.3 % ophthalmic solution Place 1 drop into both eyes 4 (four) times daily as needed (dry/irritated eyes.).     oxyCODONE-acetaminophen (PERCOCET) 10-325 MG tablet 1 tablet Orally Max. of 4 times daily, for severe pain ONLY for 30 days     prazosin (MINIPRESS) 1 MG capsule Take 1 mg by mouth at bedtime.     Propylhexedrine (BENZEDREX NA) Place 1 spray into the nose 2 (two) times daily as needed (nasal congestion.).     REPATHA SURECLICK 226 MG/ML SOAJ INJECT CONTENTS OF 1 PEN SUBCUTANEOUSLY EVERY 14 DAYS. 2 mL 11  tiotropium (SPIRIVA HANDIHALER) 18 MCG inhalation capsule Place 18 mcg into inhaler and inhale daily as needed (Shortness of breath).     traZODone (DESYREL) 50 MG tablet Take 50 mg by mouth at bedtime.     No current facility-administered medications for this visit.    REVIEW OF SYSTEMS (negative unless checked):   Cardiac:  '[]'$  Chest pain or chest pressure? '[]'$  Shortness of breath upon activity? '[]'$  Shortness of breath when lying flat? '[]'$  Irregular heart rhythm?  Vascular:  '[x]'$  Pain in calf, thigh, or hip brought on by walking? '[]'$  Pain in feet at night that wakes you up from your sleep? '[]'$  Blood clot in your veins? '[]'$  Leg swelling?  Pulmonary:  '[]'$  Oxygen at home? '[]'$  Productive cough? '[]'$  Wheezing?  Neurologic:  '[]'$  Sudden weakness in arms or legs? '[]'$   Sudden numbness in arms or legs? '[]'$  Sudden onset of difficult speaking or slurred speech? '[]'$  Temporary loss of vision in one eye? '[]'$  Problems with dizziness?  Gastrointestinal:  '[]'$  Blood in stool? '[]'$  Vomited blood?  Genitourinary:  '[]'$  Burning when urinating? '[]'$  Blood in urine?  Psychiatric:  '[]'$  Major depression  Hematologic:  '[]'$  Bleeding problems? '[]'$  Problems with blood clotting?  Dermatologic:  '[]'$  Rashes or ulcers?  Constitutional:  '[]'$  Fever or chills?  Ear/Nose/Throat:  '[]'$  Change in hearing? '[]'$  Nose bleeds? '[]'$  Sore throat?  Musculoskeletal:  '[]'$  Back pain? '[]'$  Joint pain? '[]'$  Muscle pain?   Physical Examination   Vitals:   01/09/23 1502  BP: (!) 141/82  Pulse: 63  Resp: 20  Temp: 98 F (36.7 C)  TempSrc: Temporal  SpO2: 98%  Weight: 126 lb (57.2 kg)  Height: '5\' 5"'$  (1.651 m)   Body mass index is 20.97 kg/m.  General:  WDWN in NAD; vital signs documented above Gait: Not observed HENT: WNL, normocephalic Pulmonary: normal non-labored breathing  Cardiac: Regular rate and rhythm Abdomen: soft, NT, no masses Skin: without rashes Vascular Exam/Pulses: Brisk monophasic left DP Doppler signal.  Brisk biphasic right DP/PT. Extremities: without ischemic changes, without Gangrene , without cellulitis; without open wounds;  Musculoskeletal: no muscle wasting or atrophy  Neurologic: A&O X 3;  No focal weakness or paresthesias are detected Psychiatric:  The pt has Normal affect.  Non-Invasive Vascular imaging   ABI (01/09/2023) +-------+-----------+-----------+------------+------------+  ABI/TBIToday's ABIToday's TBIPrevious ABIPrevious TBI  +-------+-----------+-----------+------------+------------+  Right 0.94       0.42       0.93        0.3           +-------+-----------+-----------+------------+------------+  Left  0.9        0.55       0.84        0             +-------+-----------+-----------+------------+------------+   Left Lower  Extremity Arterial Duplex (01/09/2023) +-----------+--------+-----+--------+----------+--------+  LEFT      PSV cm/sRatioStenosisWaveform  Comments  +-----------+--------+-----+--------+----------+--------+  CFA Distal 90                   triphasic           +-----------+--------+-----+--------+----------+--------+  DFA       124                  monophasic          +-----------+--------+-----+--------+----------+--------+  SFA Prox   100                  triphasic           +-----------+--------+-----+--------+----------+--------+  SFA Mid                                   stent     +-----------+--------+-----+--------+----------+--------+  SFA Distal 126                  triphasic           +-----------+--------+-----+--------+----------+--------+  POP Prox   51                   triphasic           +-----------+--------+-----+--------+----------+--------+  POP Distal 53                   triphasic           +-----------+--------+-----+--------+----------+--------+  ATA Distal 47                   triphasic           +-----------+--------+-----+--------+----------+--------+  PTA Distal 53                   triphasic           +-----------+--------+-----+--------+----------+--------+  PERO Distal34                   triphasic           +-----------+--------+-----+--------+----------+--------+     Left Stent(s):  +---------------+--------+--------+---------+--------+  mid SFA        PSV cm/sStenosisWaveform Comments  +---------------+--------+--------+---------+--------+  Prox to Stent  88              triphasic          +---------------+--------+--------+---------+--------+  Proximal Stent 75              triphasic          +---------------+--------+--------+---------+--------+  Mid Stent      69              triphasic          +---------------+--------+--------+---------+--------+   Distal Stent   63              triphasic          +---------------+--------+--------+---------+--------+  Distal to Stent76              triphasic            Medical Decision Making   NEILANI DUFFEE is a 72 y.o. female who presents for PAD follow-up  Based on the patient's vascular studies, her ABIs on the right are essentially unchanged at 0.94 today.  Her ABIs in the left have increased to 0.9 from 0.84.  Her left TBI has also increased from 0 to 0.55. Duplex study of the left lower extremity demonstrates triphasic flow with no stenosis in the left SFA stent The patient's claudication in bilateral lower extremities has greatly improved since her procedures in September and December 2023.  She is able to ambulate for at least 30 minutes before some claudication begins.  She denies any rest pain or wounds of the lower extremities She will continue her aspirin and Plavix.  She can follow-up with our office in 3 months for bilateral lower extremity arterial duplex study and ABIs   Vicente Serene PA-C [facility] MD: Roxanne Mins

## 2023-01-11 ENCOUNTER — Other Ambulatory Visit: Payer: Self-pay

## 2023-01-11 DIAGNOSIS — I70229 Atherosclerosis of native arteries of extremities with rest pain, unspecified extremity: Secondary | ICD-10-CM

## 2023-01-11 DIAGNOSIS — I739 Peripheral vascular disease, unspecified: Secondary | ICD-10-CM

## 2023-01-22 DIAGNOSIS — Z87891 Personal history of nicotine dependence: Secondary | ICD-10-CM | POA: Diagnosis not present

## 2023-01-22 DIAGNOSIS — N390 Urinary tract infection, site not specified: Secondary | ICD-10-CM | POA: Diagnosis not present

## 2023-01-22 DIAGNOSIS — I10 Essential (primary) hypertension: Secondary | ICD-10-CM | POA: Diagnosis not present

## 2023-01-22 DIAGNOSIS — D72829 Elevated white blood cell count, unspecified: Secondary | ICD-10-CM | POA: Diagnosis not present

## 2023-01-22 DIAGNOSIS — K529 Noninfective gastroenteritis and colitis, unspecified: Secondary | ICD-10-CM | POA: Diagnosis not present

## 2023-01-22 DIAGNOSIS — R1084 Generalized abdominal pain: Secondary | ICD-10-CM | POA: Diagnosis not present

## 2023-01-23 DIAGNOSIS — M5116 Intervertebral disc disorders with radiculopathy, lumbar region: Secondary | ICD-10-CM | POA: Diagnosis not present

## 2023-01-23 DIAGNOSIS — Z79891 Long term (current) use of opiate analgesic: Secondary | ICD-10-CM | POA: Diagnosis not present

## 2023-01-23 DIAGNOSIS — M542 Cervicalgia: Secondary | ICD-10-CM | POA: Diagnosis not present

## 2023-01-23 DIAGNOSIS — M545 Low back pain, unspecified: Secondary | ICD-10-CM | POA: Diagnosis not present

## 2023-01-23 DIAGNOSIS — G894 Chronic pain syndrome: Secondary | ICD-10-CM | POA: Diagnosis not present

## 2023-01-29 DIAGNOSIS — Z8679 Personal history of other diseases of the circulatory system: Secondary | ICD-10-CM | POA: Diagnosis not present

## 2023-01-29 DIAGNOSIS — Z8719 Personal history of other diseases of the digestive system: Secondary | ICD-10-CM | POA: Diagnosis not present

## 2023-01-29 DIAGNOSIS — R195 Other fecal abnormalities: Secondary | ICD-10-CM | POA: Diagnosis not present

## 2023-01-29 DIAGNOSIS — K2270 Barrett's esophagus without dysplasia: Secondary | ICD-10-CM | POA: Diagnosis not present

## 2023-02-02 DIAGNOSIS — R197 Diarrhea, unspecified: Secondary | ICD-10-CM | POA: Diagnosis not present

## 2023-02-06 DIAGNOSIS — F39 Unspecified mood [affective] disorder: Secondary | ICD-10-CM | POA: Diagnosis not present

## 2023-02-06 DIAGNOSIS — F341 Dysthymic disorder: Secondary | ICD-10-CM | POA: Diagnosis not present

## 2023-02-06 DIAGNOSIS — F4312 Post-traumatic stress disorder, chronic: Secondary | ICD-10-CM | POA: Diagnosis not present

## 2023-02-06 DIAGNOSIS — G4709 Other insomnia: Secondary | ICD-10-CM | POA: Diagnosis not present

## 2023-02-07 NOTE — Progress Notes (Signed)
Fax received from [facility] for medical clearance/medication hold for endoscopy to be signed by Dr. Carlis Abbott .  Provider signed, form faxed back to sender, verified successful, sent to scan center.

## 2023-02-20 DIAGNOSIS — M25552 Pain in left hip: Secondary | ICD-10-CM | POA: Diagnosis not present

## 2023-02-20 DIAGNOSIS — G894 Chronic pain syndrome: Secondary | ICD-10-CM | POA: Diagnosis not present

## 2023-02-20 DIAGNOSIS — M5116 Intervertebral disc disorders with radiculopathy, lumbar region: Secondary | ICD-10-CM | POA: Diagnosis not present

## 2023-02-20 DIAGNOSIS — M545 Low back pain, unspecified: Secondary | ICD-10-CM | POA: Diagnosis not present

## 2023-02-20 DIAGNOSIS — Z79891 Long term (current) use of opiate analgesic: Secondary | ICD-10-CM | POA: Diagnosis not present

## 2023-02-22 DIAGNOSIS — K2289 Other specified disease of esophagus: Secondary | ICD-10-CM | POA: Diagnosis not present

## 2023-02-22 DIAGNOSIS — K269 Duodenal ulcer, unspecified as acute or chronic, without hemorrhage or perforation: Secondary | ICD-10-CM | POA: Diagnosis not present

## 2023-02-22 DIAGNOSIS — K319 Disease of stomach and duodenum, unspecified: Secondary | ICD-10-CM | POA: Diagnosis not present

## 2023-02-22 DIAGNOSIS — K2930 Chronic superficial gastritis without bleeding: Secondary | ICD-10-CM | POA: Diagnosis not present

## 2023-02-22 DIAGNOSIS — K2270 Barrett's esophagus without dysplasia: Secondary | ICD-10-CM | POA: Diagnosis not present

## 2023-02-22 DIAGNOSIS — K259 Gastric ulcer, unspecified as acute or chronic, without hemorrhage or perforation: Secondary | ICD-10-CM | POA: Diagnosis not present

## 2023-02-22 DIAGNOSIS — K219 Gastro-esophageal reflux disease without esophagitis: Secondary | ICD-10-CM | POA: Diagnosis not present

## 2023-03-01 DIAGNOSIS — K219 Gastro-esophageal reflux disease without esophagitis: Secondary | ICD-10-CM | POA: Diagnosis not present

## 2023-03-01 DIAGNOSIS — K319 Disease of stomach and duodenum, unspecified: Secondary | ICD-10-CM | POA: Diagnosis not present

## 2023-03-01 DIAGNOSIS — K2930 Chronic superficial gastritis without bleeding: Secondary | ICD-10-CM | POA: Diagnosis not present

## 2023-03-01 NOTE — Progress Notes (Signed)
Fax received from [facility] for medical clearance/medication hold for endoscopy to be signed by Dr. Carlis Abbott.  Provider signed, form faxed back to sender, verified successful, sent to scan center.

## 2023-03-02 NOTE — Progress Notes (Signed)
Fax received from [facility] for medical clearance/medication hold for bilateral TFESI S1 to be signed by Dr. Donzetta Matters.  Provider signed, form faxed back to sender, verified successful, sent to scan center.

## 2023-03-27 DIAGNOSIS — M5116 Intervertebral disc disorders with radiculopathy, lumbar region: Secondary | ICD-10-CM | POA: Diagnosis not present

## 2023-03-27 DIAGNOSIS — G894 Chronic pain syndrome: Secondary | ICD-10-CM | POA: Diagnosis not present

## 2023-03-27 DIAGNOSIS — Z79891 Long term (current) use of opiate analgesic: Secondary | ICD-10-CM | POA: Diagnosis not present

## 2023-03-27 DIAGNOSIS — M545 Low back pain, unspecified: Secondary | ICD-10-CM | POA: Diagnosis not present

## 2023-04-02 ENCOUNTER — Encounter: Payer: Self-pay | Admitting: Pulmonary Disease

## 2023-04-10 ENCOUNTER — Ambulatory Visit (HOSPITAL_COMMUNITY)
Admission: RE | Admit: 2023-04-10 | Discharge: 2023-04-10 | Disposition: A | Discharge status: Home / Self Care | Payer: Medicare Other | Source: Ambulatory Visit | Attending: Vascular Surgery | Admitting: Vascular Surgery

## 2023-04-10 ENCOUNTER — Ambulatory Visit (INDEPENDENT_AMBULATORY_CARE_PROVIDER_SITE_OTHER): Payer: Medicare Other | Admitting: Physician Assistant

## 2023-04-10 ENCOUNTER — Encounter: Payer: Self-pay | Admitting: Pulmonary Disease

## 2023-04-10 ENCOUNTER — Ambulatory Visit: Payer: Medicare Other

## 2023-04-10 ENCOUNTER — Ambulatory Visit (INDEPENDENT_AMBULATORY_CARE_PROVIDER_SITE_OTHER)
Admission: RE | Admit: 2023-04-10 | Discharge: 2023-04-10 | Disposition: A | Discharge status: Home / Self Care | Payer: Medicare Other | Source: Ambulatory Visit | Attending: Vascular Surgery | Admitting: Vascular Surgery

## 2023-04-10 VITALS — BP 124/82 | HR 80 | Temp 97.9°F | Resp 18 | Ht 65.0 in | Wt 129.3 lb

## 2023-04-10 DIAGNOSIS — I739 Peripheral vascular disease, unspecified: Secondary | ICD-10-CM | POA: Diagnosis not present

## 2023-04-10 DIAGNOSIS — Z9889 Other specified postprocedural states: Secondary | ICD-10-CM | POA: Diagnosis not present

## 2023-04-10 DIAGNOSIS — I70229 Atherosclerosis of native arteries of extremities with rest pain, unspecified extremity: Secondary | ICD-10-CM | POA: Diagnosis not present

## 2023-04-10 DIAGNOSIS — Z95828 Presence of other vascular implants and grafts: Secondary | ICD-10-CM | POA: Diagnosis not present

## 2023-04-10 DIAGNOSIS — I70213 Atherosclerosis of native arteries of extremities with intermittent claudication, bilateral legs: Secondary | ICD-10-CM | POA: Diagnosis not present

## 2023-04-10 LAB — VAS US ABI WITH/WO TBI
Left ABI: 0.96
Right ABI: 0.99

## 2023-04-10 NOTE — Progress Notes (Signed)
HISTORY AND PHYSICAL     CC:  follow up. Requesting Provider:  Melida Quitter, MD  HPI: This is a 72 y.o. female who is here today for follow up for PAD.  She has a history of right above knee to below knee popliteal bypass with GSV by Dr. Imogene Burn in 2018.  She underwent right SFA angioplasty and stent placement x 2 on 08/31/2022 for recurrent high-grade stenosis in the SFA proximal to her bypass by Dr. Chestine Spore. She then underwent left common femoral and SFA endarterectomy on 09/11/2022 by Dr. Chestine Spore for lifestyle limiting claudication.  Most recently she required left SFA lithotripsy and stenting on 12/07/2022 by Dr. Chestine Spore to treat continued left lower extremity intermittent claudication.   Pt was last seen 01/09/2023 and at that time, and her claudication was greatly improved.  She was able to walk for about 30 minutes briskly.  She still had some claudication with left > right.  She did not have rest pain or non healing wounds.  She was compliant with her asa/plavix.  The pt returns today for follow up.  She is doing well.  She is not having any  rest pain or non healing wounds.  She does get a little cramping with incline in both legs.  She did quit smoking but cheats every now and then and has one.  The pt is on a statin for cholesterol management.   (Repatha) The pt is on an aspirin.    Other AC:  Plavix The pt is on CCB, prazosin for hypertension.  The pt is not on medication for diabetes.   Pt does not have family hx of AAA.  Past Medical History:  Diagnosis Date   Arthritis    "thumbs; hips" (05/14/2017)   CAD S/P 2 V PCI (pRCA, PCx-OM)    Coronary calcium scoring: June 2018: Score 1106. 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   Cervical spondylosis    Chronic lower back pain    COPD (chronic obstructive pulmonary disease) (HCC)    "little bit" (05/14/2017)   Depression    Exercise-induced asthma with acute exacerbation    "only in very hot weather" (05/14/2017)   GERD (gastroesophageal reflux disease)    History of IBS    resovlved   Hx of multiple concussions    from horse training and riding   Hyperlipidemia with target LDL less than 70    Mostly statin intolerant,. 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   Pneumonia 2020   Sleep apnea    does not wear CPAP; "think it was medication related" (05/14/2017)   Stroke [facility]) 2010   TIA   Thoracic aortic ectasia (HCC)    ~40 mm on Coronary Calcium Score CT - recommend f/u CTA or MRA   TIA (transient ischemic attack) 09/2011   "several"    Past Surgical History:  Procedure Laterality Date   ABDOMINAL AORTOGRAM W/LOWER EXTREMITY N/A 05/14/2017   Procedure: Abdominal Aortogram w/Lower Extremity;  Surgeon: Fransisco Hertz, MD;  Location: [facility] INVASIVE CV LAB;  Service: Cardiovascular;  Laterality: N/A;   ABDOMINAL AORTOGRAM W/LOWER EXTREMITY N/A 08/31/2022   Procedure: ABDOMINAL AORTOGRAM W/LOWER EXTREMITY;  Surgeon: Cephus Shelling, MD;  Location: [facility] INVASIVE CV LAB;  Service: Cardiovascular;  Laterality: N/A;   ABDOMINAL AORTOGRAM W/LOWER EXTREMITY N/A 12/07/2022   Procedure: ABDOMINAL AORTOGRAM W/LOWER EXTREMITY;  Surgeon: Cephus Shelling, MD;  Location: [facility] INVASIVE CV LAB;  Service: Cardiovascular;  Laterality: N/A;   ACROMIO-CLAVICULAR JOINT REPAIR Left 1990s   "I think it was called an [facility] joint removal"   BACK SURGERY     BYPASS GRAFT POPLITEAL TO POPLITEAL Right 05/15/2017   Procedure: BYPASS GRAFT ABOVE KNEE POPLITEAL TO BELOW KNEE POPLITEAL ARTERY;  Surgeon:  Fransisco Hertz, MD;  Location: [facility] OR;  Service: Vascular;  Laterality: Right;   CARDIAC CATHETERIZATION     CORONARY STENT INTERVENTION N/A 09/30/2021   Procedure: CORONARY STENT INTERVENTION;  Surgeon: Marykay Lex, MD;  Location: [facility] INVASIVE CV LAB;  Service: Cardiovascular;  Laterality: N/A;   CORONARY ULTRASOUND/IVUS N/A 09/30/2021   Procedure: Intravascular Ultrasound/IVUS;  Surgeon: Marykay Lex, MD;  Location: [facility] INVASIVE CV LAB;  Service: Cardiovascular;  Laterality: N/A;   ENDARTERECTOMY FEMORAL Left 09/11/2022   Procedure: LEFT COMMON FEMORAL ENDARTERECTOMY;  Surgeon: Cephus Shelling, MD;  Location: [facility] OR;  Service: Vascular;  Laterality: Left;   ESOPHAGOGASTRODUODENOSCOPY (EGD) WITH PROPOFOL N/A 04/29/2018   Procedure: ESOPHAGOGASTRODUODENOSCOPY (EGD) WITH PROPOFOL;  Surgeon: Kathi Der, MD;  Location: [facility] ENDOSCOPY;  Service: Gastroenterology;  Laterality: N/A;   FOOT FRACTURE SURGERY Bilateral    multiple procedures   FRACTURE SURGERY     JOINT REPLACEMENT     LEFT HEART CATH AND CORONARY ANGIOGRAPHY N/A 09/30/2021   Procedure: LEFT HEART CATH AND CORONARY ANGIOGRAPHY;  Surgeon: Marykay Lex, MD;  Location: [facility] INVASIVE CV LAB;  Service: Cardiovascular;  Laterality: N/A;   LOWER EXTREMITY ANGIOGRAPHY N/A 06/16/2020   Procedure: LOWER EXTREMITY ANGIOGRAPHY;  Surgeon: Cephus Shelling, MD;  Location: [facility] INVASIVE CV LAB;  Service: Cardiovascular;  Laterality: N/A;   MAXIMUM ACCESS (MAS)POSTERIOR LUMBAR INTERBODY FUSION (PLIF) 1 LEVEL N/A 09/30/2014   Procedure: FOR MAXIMUM ACCESS (MAS) POSTERIOR LUMBAR INTERBODY FUSION (PLIF) 1 LEVEL;  Surgeon: Tia Alert, MD;  Location: [facility] NEURO ORS;  Service: Neurosurgery;  Laterality: N/A;  FOR MAXIMUM ACCESS (MAS) POSTERIOR LUMBAR INTERBODY FUSION (PLIF) 1 LEVEL LUMBAR 4-5   MULTIPLE TOOTH EXTRACTIONS Right 04/2017   NM MYOVIEW LTD  06/2017    Normal EF, 66%. No ST changes. Normal study. LOW RISK.   PATCH ANGIOPLASTY  Left 09/11/2022   Procedure: LEFT FEMORAL PATCH ANGIOPLASTY USING XENOSURE BIOLOGIC PATCH 1CMX14CM;  Surgeon: Cephus Shelling, MD;  Location: [facility] OR;  Service: Vascular;  Laterality: Left;   PERIPHERAL VASCULAR ATHERECTOMY Left 06/16/2020   Procedure: PERIPHERAL VASCULAR ATHERECTOMY;  Surgeon: Cephus Shelling, MD;  Location: [facility] INVASIVE CV LAB;  Service: Cardiovascular;  Laterality: Left;  SFA   PERIPHERAL VASCULAR ATHERECTOMY Left 12/07/2022   Procedure: PERIPHERAL VASCULAR ATHERECTOMY;  Surgeon: Cephus Shelling, MD;  Location: [facility] INVASIVE CV LAB;  Service: Cardiovascular;  Laterality: Left;   PERIPHERAL VASCULAR INTERVENTION Right 08/31/2022   Procedure: PERIPHERAL VASCULAR INTERVENTION;  Surgeon: Cephus Shelling, MD;  Location: [facility] INVASIVE CV LAB;  Service: Cardiovascular;  Laterality: Right;   PERIPHERAL VASCULAR INTERVENTION  12/07/2022   Procedure: PERIPHERAL VASCULAR INTERVENTION;  Surgeon: Cephus Shelling, MD;  Location: [facility] INVASIVE CV LAB;  Service: Cardiovascular;;   TOTAL HIP ARTHROPLASTY Right 01/16/2020   Procedure:  RIGHT TOTAL HIP ARTHROPLASTY ANTERIOR APPROACH;  Surgeon: Kathryne Hitch, MD;  Location: [facility] ORS;  Service: Orthopedics;  Laterality: Right;   TOTAL HIP ARTHROPLASTY Left 04/30/2020   Procedure: LEFT TOTAL HIP ARTHROPLASTY ANTERIOR APPROACH;  Surgeon: Kathryne Hitch, MD;  Location: [facility] ORS;  Service: Orthopedics;  Laterality: Left;   TRANSTHORACIC ECHOCARDIOGRAM  09/07/2013   Done to evaluate TIA: Normal LV size and function. EF 55-60%. GR 1 DD. Mild left atrial dilation. Mildly calcified mitral leaflets. Otherwise normal.    Allergies  Allergen Reactions   Penicillins Swelling and Other (See Comments)    TONGUE SWELLING  PATIENT HAD A PCN REACTION WITH IMMEDIATE RASH, FACIAL/TONGUE/THROAT SWELLING, SOB, OR LIGHTHEADEDNESS WITH HYPOTENSION:  #  #  #  YES  #  #  #   Has patient had a PCN reaction causing severe rash involving mucus  membranes or skin necrosis: No Has patient had a PCN reaction that required hospitalization: No Has patient had a PCN reaction occurring within the last 10 years: No    Nucynta Er [Tapentadol Hcl Er] Other (See Comments)    Dry mouth   Statins     muscle pain   Statins Support [A-G Pro] Other (See Comments)   Lyrica [Pregabalin] Other (See Comments)    DRY MOUTH    Current Outpatient Medications  Medication Sig Dispense Refill   albuterol (VENTOLIN HFA) 108 (90 Base) MCG/ACT inhaler Inhale 2 puffs into the lungs daily as needed for shortness of breath.     amLODipine (NORVASC) 10 MG tablet Take 10 mg by mouth in the morning.     Apoaequorin (PREVAGEN PO) Take 1 capsule by mouth 3 (three) times a week.     ARIPiprazole (ABILIFY) 2 MG tablet Take 2 mg by mouth every morning.     Ascorbic Acid (VITAMIN C) 1000 MG tablet Take 1,000 mg by mouth 3 (three) times a week.     aspirin EC 81 MG tablet Take 81 mg by mouth in the morning.     bisoprolol (ZEBETA) 5 MG tablet Take 1 tablet (5 mg total) by mouth daily. 90 tablet 3   Brimonidine Tartrate (LUMIFY) 0.025 % SOLN Place 1 drop into both eyes in the morning.     buPROPion (WELLBUTRIN XL) 150 MG 24 hr tablet Take 150 mg by mouth every morning.     Calcium Carbonate-Vitamin D (CALTRATE 600+D PO) Take 1 tablet by mouth 2 (two) times a week.     clopidogrel (PLAVIX) 75 MG tablet TAKE ONE TABLET BY MOUTH DAILY 90 tablet 2   desvenlafaxine (PRISTIQ) 100 MG 24 hr tablet Take 100 mg by mouth in the morning.     diazepam (VALIUM) 10 MG tablet Take 5-10 mg by mouth at bedtime as needed for sleep or anxiety.     Evolocumab (REPATHA) 140 MG/ML SOSY Inject 140 mg into the skin every 14 (fourteen) days. 2.1 mL 3   lisinopril (ZESTRIL) 10 MG tablet Take 10 mg by mouth in the morning.     Multiple Vitamins-Minerals (CENTRUM SILVER 50+WOMEN) TABS Take 1 tablet by mouth 4 (four) times a week. In the morning.     naphazoline-pheniramine (VISINE) 0.025-0.3 %  ophthalmic solution Place 1 drop into both eyes 4 (four) times daily as needed (dry/irritated eyes.).     oxyCODONE-acetaminophen (PERCOCET) 10-325 MG tablet 1 tablet Orally Max. of 4 times daily, for severe pain ONLY for 30 days     prazosin (MINIPRESS) 1 MG capsule Take 1  mg by mouth at bedtime.     Propylhexedrine (BENZEDREX NA) Place 1 spray into the nose 2 (two) times daily as needed (nasal congestion.).     REPATHA SURECLICK 140 MG/ML SOAJ INJECT CONTENTS OF 1 PEN SUBCUTANEOUSLY EVERY 14 DAYS. 2 mL 11   tiotropium (SPIRIVA HANDIHALER) 18 MCG inhalation capsule Place 18 mcg into inhaler and inhale daily as needed (Shortness of breath).     traZODone (DESYREL) 50 MG tablet Take 50 mg by mouth at bedtime.     No current facility-administered medications for this visit.    Family History  Problem Relation Age of Onset   Stroke Father    Heart attack Mother    Stroke Mother     Social History   Socioeconomic History   Marital status: Widowed    Spouse name: Iantha Fallen    Number of children: 0   Years of education: COLLEGE   Highest education level: Some college, no degree  Occupational History   Occupation: Physiological scientist   Tobacco Use   Smoking status: Some Days    Packs/day: 0.00    Years: 36.00    Additional pack years: 0.00    Total pack years: 0.00    Types: Cigarettes    Passive exposure: Never   Smokeless tobacco: Never   Tobacco comments:    [name] says she smokes one cigarette every couple of weeks. [name] says she has a lot of friends who still smoke  Vaping Use   Vaping Use: Former  Substance and Sexual Activity   Alcohol use: Yes    Alcohol/week: 2.0 standard drinks of alcohol    Types: 2 Glasses of wine per week    Comment: wine   Drug use: No   Sexual activity: Not Currently    Birth control/protection: Post-menopausal  Other Topics Concern   Not on file  Social History Narrative   Patient lives Alone is a Widow Patient has no children.    Patient is a Engineering geologist.    Patient is right handed.    Patient has BA degree.    Smokes 1 cigarette every couple of weeks.   Social Determinants of Health   Financial Resource Strain: Not on file  Food Insecurity: Not on file  Transportation Needs: Not on file  Physical Activity: Not on file  Stress: Not on file  Social Connections: Not on file  Intimate Partner Violence: Not on file     REVIEW OF SYSTEMS:   [X]  denotes positive finding, [ ]  denotes negative finding Cardiac  Comments:  Chest pain or chest pressure:    Shortness of breath upon exertion:    Short of breath when lying flat:    Irregular heart rhythm:        Vascular    Pain in calf, thigh, or hip brought on by ambulation:    Pain in feet at night that wakes you up from your sleep:     Blood clot in your veins:    Leg swelling:         Pulmonary    Oxygen at home:    Productive cough:     Wheezing:         Neurologic    Sudden weakness in arms or legs:     Sudden numbness in arms or legs:     Sudden onset of difficulty speaking or slurred speech:    Temporary loss of vision in one eye:     Problems with dizziness:  Gastrointestinal    Blood in stool:     Vomited blood:         Genitourinary    Burning when urinating:     Blood in urine:        Psychiatric    Major depression:         Hematologic    Bleeding problems:    Problems with blood clotting too easily:        Skin    Rashes or ulcers:        Constitutional    Fever or chills:      PHYSICAL EXAMINATION:  Today's Vitals   04/10/23 1442  BP: 124/82  Pulse: 80  Resp: 18  Temp: 97.9 F (36.6 C)  TempSrc: Temporal  SpO2: 95%  Weight: 129 lb 4.8 oz (58.7 kg)  Height: 5\' 5"  (1.651 m)  PainSc: 0-No pain   Body mass index is 21.52 kg/m.   General:  WDWN in NAD; vital signs documented above Gait: Not observed HENT: WNL, normocephalic Pulmonary: normal non-labored breathing , without wheezing Cardiac: regular HR, without  carotid bruits Abdomen: soft, NT; aortic pulse is not palpable Skin: without rashes Vascular Exam/Pulses:  Right Left  Radial 2+ (normal) 2+ (normal)  Femoral 2+ (normal) 2+ (normal)  Popliteal Unable to palpate Unable to palpate  DP 2+ (normal) 2+ (normal)  PT 2+ (normal) 2+ (normal)   Extremities: without ischemic changes, without Gangrene , without cellulitis; without open wounds Musculoskeletal: no muscle wasting or atrophy  Neurologic: A&O X 3 Psychiatric:  The pt has Normal affect.   Non-Invasive Vascular Imaging:   ABI's/TBI's on 04/10/2023: Right:  0.99/0.53 - Great toe pressure: 70 Left:  0.96/0.64 - Great toe pressure: 85  Arterial duplex on 04/10/2023: +-----------+--------+-----+--------+----------+--------+  RIGHT     PSV cm/sRatioStenosisWaveform  Comments  +-----------+--------+-----+--------+----------+--------+  CFA Prox   70                   triphasic           +-----------+--------+-----+--------+----------+--------+  CFA Distal 39                   triphasic broad     +-----------+--------+-----+--------+----------+--------+  DFA       101                  triphasic           +-----------+--------+-----+--------+----------+--------+  SFA Prox   63                   triphasic           +-----------+--------+-----+--------+----------+--------+  SFA Mid                                   stent     +-----------+--------+-----+--------+----------+--------+  SFA Distal 65                   triphasic           +-----------+--------+-----+--------+----------+--------+  POP Prox   23                   triphasic           +-----------+--------+-----+--------+----------+--------+  POP Mid    24                   triphasic           +-----------+--------+-----+--------+----------+--------+  POP Distal 22                   triphasic           +-----------+--------+-----+--------+----------+--------+   ATA Distal 10                   biphasic            +-----------+--------+-----+--------+----------+--------+  PTA Distal 66                   triphasic           +-----------+--------+-----+--------+----------+--------+  PERO Distal10                   monophasic          +-----------+--------+-----+--------+----------+--------+   Right Stent(s):  +---------------+--------+--------+---------+--------+  SFA           PSV cm/sStenosisWaveform Comments  +---------------+--------+--------+---------+--------+  Prox to Stent  71              triphasic          +---------------+--------+--------+---------+--------+  Proximal Stent 72              triphasic          +---------------+--------+--------+---------+--------+  Mid Stent      34              triphasic          +---------------+--------+--------+---------+--------+  Distal Stent   30              triphasic          +---------------+--------+--------+---------+--------+  Distal to Stent78              triphasic          +---------------+--------+--------+---------+--------+    +-----------+--------+-----+---------------+----------+--------+  LEFT      PSV cm/sRatioStenosis       Waveform  Comments  +-----------+--------+-----+---------------+----------+--------+  CFA Prox   19                          biphasic            +-----------+--------+-----+---------------+----------+--------+  DFA       117                         biphasic            +-----------+--------+-----+---------------+----------+--------+  SFA Prox   143                         triphasic           +-----------+--------+-----+---------------+----------+--------+  SFA Mid    147          30-49% stenosisbiphasic            +-----------+--------+-----+---------------+----------+--------+  SFA Distal 74                          triphasic            +-----------+--------+-----+---------------+----------+--------+  POP Prox   49                          biphasic            +-----------+--------+-----+---------------+----------+--------+  POP Mid    60  triphasic           +-----------+--------+-----+---------------+----------+--------+  POP Distal 57                          triphasic           +-----------+--------+-----+---------------+----------+--------+  TP Trunk   59                          triphasic           +-----------+--------+-----+---------------+----------+--------+  ATA Distal 13                          biphasic            +-----------+--------+-----+---------------+----------+--------+  PTA Distal 32                          biphasic            +-----------+--------+-----+---------------+----------+--------+  PERO Distal15                          monophasic          +-----------+--------+-----+---------------+----------+--------+     Left Stent(s):  +---------------+--------+--------+---------+--------+  SFA           PSV cm/sStenosisWaveform Comments  +---------------+--------+--------+---------+--------+  Prox to Stent  111             biphasic           +---------------+--------+--------+---------+--------+  Proximal Stent 98              triphasic          +---------------+--------+--------+---------+--------+  Mid Stent      94              triphasic          +---------------+--------+--------+---------+--------+  Distal Stent   89              triphasic          +---------------+--------+--------+---------+--------+  Distal to Stent75              triphasic          +---------------+--------+--------+---------+--------+   Summary:  Patent bilateral SFA stents with no increased velocity noted within. 30 - 49% stenosis noted in the left mid SFA.   Previous ABI's/TBI's on 01/09/2023: Right:   0.85/0.42 - Great toe pressure: 54 Left:  0.90/0.55 - Great toe pressure:  71  Previous arterial duplex on 01/09/2023: +-----------+--------+-----+--------+----------+--------+  LEFT      PSV cm/sRatioStenosisWaveform  Comments  +-----------+--------+-----+--------+----------+--------+  CFA Distal 90                   triphasic           +-----------+--------+-----+--------+----------+--------+  DFA       124                  monophasic          +-----------+--------+-----+--------+----------+--------+  SFA Prox   100                  triphasic           +-----------+--------+-----+--------+----------+--------+  SFA Mid  stent     +-----------+--------+-----+--------+----------+--------+  SFA Distal 126                  triphasic           +-----------+--------+-----+--------+----------+--------+  POP Prox   51                   triphasic           +-----------+--------+-----+--------+----------+--------+  POP Distal 53                   triphasic           +-----------+--------+-----+--------+----------+--------+  ATA Distal 47                   triphasic           +-----------+--------+-----+--------+----------+--------+  PTA Distal 53                   triphasic           +-----------+--------+-----+--------+----------+--------+  PERO Distal34                   triphasic           +-----------+--------+-----+--------+----------+--------+   Left Stent(s):  +---------------+--------+--------+---------+--------+  mid SFA        PSV cm/sStenosisWaveform Comments  +---------------+--------+--------+---------+--------+  Prox to Stent  88              triphasic          +---------------+--------+--------+---------+--------+  Proximal Stent 75              triphasic          +---------------+--------+--------+---------+--------+  Mid Stent      69               triphasic          +---------------+--------+--------+---------+--------+  Distal Stent   63              triphasic          +---------------+--------+--------+---------+--------+  Distal to Stent76              triphasic          +---------------+--------+--------+---------+--------+   Summary:  Left: Patent stent in the left mid SFA. No stenosis identified in the left  lower extremity.     ASSESSMENT/PLAN:: 72 y.o. female here for follow up for PAD with hx of right popliteal bypass by Dr. Imogene Burn in 2018.  She underwent right SFA angioplasty and stent placement x 2 on 08/31/2022 for recurrent high-grade stenosis in the SFA proximal to her bypass by Dr. Chestine Spore. She then underwent left common femoral and SFA endarterectomy on 09/11/2022 by Dr. Chestine Spore for lifestyle limiting claudication.  Most recently she required left SFA lithotripsy and stenting on 12/07/2022 by Dr. Chestine Spore to treat continued left lower extremity intermittent claudication   -pt doing well and has palpable pedal pulses bilaterally.  She does not have any non healing wounds or rest pain.  Her duplex today shows an mild stenosis in the SFA on the left, otherwise, patent bilaterally with triphasic waveforms.   -continue asa/plavix/repatha -pt will f/u in 6 months with ABI and bilateral lower extremity duplex.  She knows to call sooner if she has any issues before then.     Doreatha Massed, [facility] [phone]  Clinic MD:   Chestine Spore

## 2023-04-20 ENCOUNTER — Other Ambulatory Visit: Payer: Self-pay

## 2023-04-20 DIAGNOSIS — I739 Peripheral vascular disease, unspecified: Secondary | ICD-10-CM

## 2023-04-20 DIAGNOSIS — I70229 Atherosclerosis of native arteries of extremities with rest pain, unspecified extremity: Secondary | ICD-10-CM

## 2023-05-04 DIAGNOSIS — I10 Essential (primary) hypertension: Secondary | ICD-10-CM | POA: Diagnosis not present

## 2023-05-04 DIAGNOSIS — Z87891 Personal history of nicotine dependence: Secondary | ICD-10-CM | POA: Diagnosis not present

## 2023-05-04 DIAGNOSIS — M79644 Pain in right finger(s): Secondary | ICD-10-CM | POA: Diagnosis not present

## 2023-05-04 DIAGNOSIS — G729 Myopathy, unspecified: Secondary | ICD-10-CM | POA: Diagnosis not present

## 2023-05-04 DIAGNOSIS — F325 Major depressive disorder, single episode, in full remission: Secondary | ICD-10-CM | POA: Diagnosis not present

## 2023-05-04 DIAGNOSIS — I700 Atherosclerosis of aorta: Secondary | ICD-10-CM | POA: Diagnosis not present

## 2023-05-04 DIAGNOSIS — E782 Mixed hyperlipidemia: Secondary | ICD-10-CM | POA: Diagnosis not present

## 2023-05-04 DIAGNOSIS — N952 Postmenopausal atrophic vaginitis: Secondary | ICD-10-CM | POA: Diagnosis not present

## 2023-05-04 DIAGNOSIS — I2510 Atherosclerotic heart disease of native coronary artery without angina pectoris: Secondary | ICD-10-CM | POA: Diagnosis not present

## 2023-05-04 DIAGNOSIS — J449 Chronic obstructive pulmonary disease, unspecified: Secondary | ICD-10-CM | POA: Diagnosis not present

## 2023-05-04 DIAGNOSIS — I719 Aortic aneurysm of unspecified site, without rupture: Secondary | ICD-10-CM | POA: Diagnosis not present

## 2023-05-04 DIAGNOSIS — M810 Age-related osteoporosis without current pathological fracture: Secondary | ICD-10-CM | POA: Diagnosis not present

## 2023-05-08 DIAGNOSIS — M545 Low back pain, unspecified: Secondary | ICD-10-CM | POA: Diagnosis not present

## 2023-05-08 DIAGNOSIS — M5116 Intervertebral disc disorders with radiculopathy, lumbar region: Secondary | ICD-10-CM | POA: Diagnosis not present

## 2023-05-08 DIAGNOSIS — M47812 Spondylosis without myelopathy or radiculopathy, cervical region: Secondary | ICD-10-CM | POA: Diagnosis not present

## 2023-05-08 DIAGNOSIS — Z79891 Long term (current) use of opiate analgesic: Secondary | ICD-10-CM | POA: Diagnosis not present

## 2023-05-08 DIAGNOSIS — M542 Cervicalgia: Secondary | ICD-10-CM | POA: Diagnosis not present

## 2023-05-08 DIAGNOSIS — G894 Chronic pain syndrome: Secondary | ICD-10-CM | POA: Diagnosis not present

## 2023-05-24 DIAGNOSIS — H5203 Hypermetropia, bilateral: Secondary | ICD-10-CM | POA: Diagnosis not present

## 2023-05-24 DIAGNOSIS — H25813 Combined forms of age-related cataract, bilateral: Secondary | ICD-10-CM | POA: Diagnosis not present

## 2023-05-29 ENCOUNTER — Ambulatory Visit (INDEPENDENT_AMBULATORY_CARE_PROVIDER_SITE_OTHER): Payer: Medicare Other

## 2023-05-29 VITALS — BP 131/89 | HR 79 | Temp 98.1°F | Resp 20 | Ht 65.0 in | Wt 130.4 lb

## 2023-05-29 DIAGNOSIS — M810 Age-related osteoporosis without current pathological fracture: Secondary | ICD-10-CM

## 2023-05-29 MED ORDER — DENOSUMAB 60 MG/ML ~~LOC~~ SOSY
60.0000 mg | PREFILLED_SYRINGE | Freq: Once | SUBCUTANEOUS | Status: AC
  Administered 2023-05-29: 60 mg via SUBCUTANEOUS
  Filled 2023-05-29: qty 1

## 2023-05-29 NOTE — Progress Notes (Signed)
Diagnosis: Osteoporosis  Provider:  Chilton Greathouse MD  Procedure: Injection  Prolia (Denosumab), Dose: 60 mg, Site: subcutaneous, Number of injections: 1  Post Care: Patient declined observation  Discharge: Condition: Good, Destination: Home . AVS Declined  Performed by:  Adriana Mccallum, RN

## 2023-05-31 DIAGNOSIS — K2270 Barrett's esophagus without dysplasia: Secondary | ICD-10-CM | POA: Diagnosis not present

## 2023-05-31 DIAGNOSIS — K2100 Gastro-esophageal reflux disease with esophagitis, without bleeding: Secondary | ICD-10-CM | POA: Diagnosis not present

## 2023-05-31 DIAGNOSIS — K2289 Other specified disease of esophagus: Secondary | ICD-10-CM | POA: Diagnosis not present

## 2023-06-05 DIAGNOSIS — K2270 Barrett's esophagus without dysplasia: Secondary | ICD-10-CM | POA: Diagnosis not present

## 2023-06-06 DIAGNOSIS — M542 Cervicalgia: Secondary | ICD-10-CM | POA: Diagnosis not present

## 2023-06-06 DIAGNOSIS — M47812 Spondylosis without myelopathy or radiculopathy, cervical region: Secondary | ICD-10-CM | POA: Diagnosis not present

## 2023-06-06 DIAGNOSIS — M5116 Intervertebral disc disorders with radiculopathy, lumbar region: Secondary | ICD-10-CM | POA: Diagnosis not present

## 2023-06-06 DIAGNOSIS — Z79891 Long term (current) use of opiate analgesic: Secondary | ICD-10-CM | POA: Diagnosis not present

## 2023-06-06 DIAGNOSIS — M545 Low back pain, unspecified: Secondary | ICD-10-CM | POA: Diagnosis not present

## 2023-06-06 DIAGNOSIS — G894 Chronic pain syndrome: Secondary | ICD-10-CM | POA: Diagnosis not present

## 2023-07-03 DIAGNOSIS — M545 Low back pain, unspecified: Secondary | ICD-10-CM | POA: Diagnosis not present

## 2023-07-03 DIAGNOSIS — M5116 Intervertebral disc disorders with radiculopathy, lumbar region: Secondary | ICD-10-CM | POA: Diagnosis not present

## 2023-07-03 DIAGNOSIS — G894 Chronic pain syndrome: Secondary | ICD-10-CM | POA: Diagnosis not present

## 2023-07-03 DIAGNOSIS — M47812 Spondylosis without myelopathy or radiculopathy, cervical region: Secondary | ICD-10-CM | POA: Diagnosis not present

## 2023-07-03 DIAGNOSIS — Z79891 Long term (current) use of opiate analgesic: Secondary | ICD-10-CM | POA: Diagnosis not present

## 2023-07-03 DIAGNOSIS — M542 Cervicalgia: Secondary | ICD-10-CM | POA: Diagnosis not present

## 2023-07-09 ENCOUNTER — Encounter: Payer: Self-pay | Admitting: Pulmonary Disease

## 2023-07-09 ENCOUNTER — Telehealth: Payer: Self-pay | Admitting: *Deleted

## 2023-07-09 ENCOUNTER — Telehealth: Payer: Self-pay

## 2023-07-09 ENCOUNTER — Other Ambulatory Visit (HOSPITAL_COMMUNITY): Payer: Self-pay

## 2023-07-09 NOTE — Telephone Encounter (Signed)
Receive a request from covermymed  Brenda Carney (Key: [id number]) Repatha SureClick 140MG /ML auto-injectors Form [employer] Medicare Electronic Request Form

## 2023-07-09 NOTE — Telephone Encounter (Signed)
Pharmacy Patient Advocate Encounter   Received notification from Physician's Office that prior authorization for REPA THA is required/requested.   Insurance verification completed.   The patient is insured through [employer] .   Per test claim: [place] [employer] via CoverMyMeds Key/confirmation #/EOC [id number]   Status is pending

## 2023-07-12 ENCOUNTER — Telehealth: Payer: Self-pay | Admitting: Cardiology

## 2023-07-12 NOTE — Telephone Encounter (Signed)
Calling to report that Prior Auth for Repatha has been approved, will  send fax today to confirm. Please advise

## 2023-07-13 MED ORDER — REPATHA SURECLICK 140 MG/ML ~~LOC~~ SOAJ: 140.0000 mg | SUBCUTANEOUS | 11 refills | Status: DC

## 2023-07-13 NOTE — Addendum Note (Signed)
Addended by: Kenzlee Fishburn E on: 07/13/2023 02:14 PM   Modules accepted: Orders

## 2023-07-13 NOTE — Telephone Encounter (Signed)
Refill sent to pharmacy.   

## 2023-07-13 NOTE — Telephone Encounter (Signed)
Pharmacy Patient Advocate Encounter  Received notification from [facility] that Prior Authorization for REPATHA has been APPROVED from 07/12/23 to 07/11/24.Marland Kitchen

## 2023-07-17 DIAGNOSIS — L820 Inflamed seborrheic keratosis: Secondary | ICD-10-CM | POA: Diagnosis not present

## 2023-07-17 DIAGNOSIS — T148XXA Other injury of unspecified body region, initial encounter: Secondary | ICD-10-CM | POA: Diagnosis not present

## 2023-07-18 DIAGNOSIS — H25013 Cortical age-related cataract, bilateral: Secondary | ICD-10-CM | POA: Diagnosis not present

## 2023-07-18 DIAGNOSIS — H2513 Age-related nuclear cataract, bilateral: Secondary | ICD-10-CM | POA: Diagnosis not present

## 2023-08-08 DIAGNOSIS — M542 Cervicalgia: Secondary | ICD-10-CM | POA: Diagnosis not present

## 2023-08-08 DIAGNOSIS — G894 Chronic pain syndrome: Secondary | ICD-10-CM | POA: Diagnosis not present

## 2023-08-08 DIAGNOSIS — M5116 Intervertebral disc disorders with radiculopathy, lumbar region: Secondary | ICD-10-CM | POA: Diagnosis not present

## 2023-08-08 DIAGNOSIS — Z79891 Long term (current) use of opiate analgesic: Secondary | ICD-10-CM | POA: Diagnosis not present

## 2023-08-08 DIAGNOSIS — M545 Low back pain, unspecified: Secondary | ICD-10-CM | POA: Diagnosis not present

## 2023-08-08 DIAGNOSIS — M47812 Spondylosis without myelopathy or radiculopathy, cervical region: Secondary | ICD-10-CM | POA: Diagnosis not present

## 2023-08-17 ENCOUNTER — Other Ambulatory Visit: Payer: Self-pay | Admitting: Cardiology

## 2023-08-17 ENCOUNTER — Other Ambulatory Visit: Payer: Self-pay | Admitting: Vascular Surgery

## 2023-08-28 DIAGNOSIS — Z961 Presence of intraocular lens: Secondary | ICD-10-CM | POA: Diagnosis not present

## 2023-08-28 DIAGNOSIS — H2512 Age-related nuclear cataract, left eye: Secondary | ICD-10-CM | POA: Diagnosis not present

## 2023-08-28 DIAGNOSIS — H25012 Cortical age-related cataract, left eye: Secondary | ICD-10-CM | POA: Diagnosis not present

## 2023-08-28 DIAGNOSIS — H25812 Combined forms of age-related cataract, left eye: Secondary | ICD-10-CM | POA: Diagnosis not present

## 2023-09-04 DIAGNOSIS — M47812 Spondylosis without myelopathy or radiculopathy, cervical region: Secondary | ICD-10-CM | POA: Diagnosis not present

## 2023-09-04 DIAGNOSIS — Z79891 Long term (current) use of opiate analgesic: Secondary | ICD-10-CM | POA: Diagnosis not present

## 2023-09-04 DIAGNOSIS — M542 Cervicalgia: Secondary | ICD-10-CM | POA: Diagnosis not present

## 2023-09-04 DIAGNOSIS — M545 Low back pain, unspecified: Secondary | ICD-10-CM | POA: Diagnosis not present

## 2023-09-04 DIAGNOSIS — M5116 Intervertebral disc disorders with radiculopathy, lumbar region: Secondary | ICD-10-CM | POA: Diagnosis not present

## 2023-09-04 DIAGNOSIS — G894 Chronic pain syndrome: Secondary | ICD-10-CM | POA: Diagnosis not present

## 2023-09-11 DIAGNOSIS — Z961 Presence of intraocular lens: Secondary | ICD-10-CM | POA: Diagnosis not present

## 2023-09-11 DIAGNOSIS — H2511 Age-related nuclear cataract, right eye: Secondary | ICD-10-CM | POA: Diagnosis not present

## 2023-09-11 DIAGNOSIS — H25811 Combined forms of age-related cataract, right eye: Secondary | ICD-10-CM | POA: Diagnosis not present

## 2023-09-11 DIAGNOSIS — H25011 Cortical age-related cataract, right eye: Secondary | ICD-10-CM | POA: Diagnosis not present

## 2023-09-13 ENCOUNTER — Other Ambulatory Visit: Payer: Self-pay | Admitting: Cardiology

## 2023-09-13 DIAGNOSIS — I2510 Atherosclerotic heart disease of native coronary artery without angina pectoris: Secondary | ICD-10-CM

## 2023-09-13 DIAGNOSIS — E785 Hyperlipidemia, unspecified: Secondary | ICD-10-CM

## 2023-09-13 DIAGNOSIS — I739 Peripheral vascular disease, unspecified: Secondary | ICD-10-CM

## 2023-09-18 NOTE — Telephone Encounter (Signed)
Pharmacy Patient Advocate Encounter  Received notification from [facility] that Prior Authorization for REPATHA has been APPROVED from 7.18.24 to 7.18.25.Marland Kitchen

## 2023-10-09 ENCOUNTER — Encounter (HOSPITAL_COMMUNITY): Payer: BLUE CROSS/BLUE SHIELD

## 2023-10-09 ENCOUNTER — Ambulatory Visit: Payer: BLUE CROSS/BLUE SHIELD

## 2023-10-09 ENCOUNTER — Other Ambulatory Visit (HOSPITAL_COMMUNITY): Payer: BLUE CROSS/BLUE SHIELD

## 2023-10-11 DIAGNOSIS — G894 Chronic pain syndrome: Secondary | ICD-10-CM | POA: Diagnosis not present

## 2023-10-11 DIAGNOSIS — M542 Cervicalgia: Secondary | ICD-10-CM | POA: Diagnosis not present

## 2023-10-11 DIAGNOSIS — Z79891 Long term (current) use of opiate analgesic: Secondary | ICD-10-CM | POA: Diagnosis not present

## 2023-10-11 DIAGNOSIS — M545 Low back pain, unspecified: Secondary | ICD-10-CM | POA: Diagnosis not present

## 2023-10-11 DIAGNOSIS — M25552 Pain in left hip: Secondary | ICD-10-CM | POA: Diagnosis not present

## 2023-10-11 DIAGNOSIS — H43811 Vitreous degeneration, right eye: Secondary | ICD-10-CM | POA: Diagnosis not present

## 2023-10-11 DIAGNOSIS — Z961 Presence of intraocular lens: Secondary | ICD-10-CM | POA: Diagnosis not present

## 2023-10-11 DIAGNOSIS — M5116 Intervertebral disc disorders with radiculopathy, lumbar region: Secondary | ICD-10-CM | POA: Diagnosis not present

## 2023-10-11 DIAGNOSIS — M47812 Spondylosis without myelopathy or radiculopathy, cervical region: Secondary | ICD-10-CM | POA: Diagnosis not present

## 2023-10-15 DIAGNOSIS — H04121 Dry eye syndrome of right lacrimal gland: Secondary | ICD-10-CM | POA: Diagnosis not present

## 2023-10-16 ENCOUNTER — Ambulatory Visit (INDEPENDENT_AMBULATORY_CARE_PROVIDER_SITE_OTHER): Payer: Medicare Other | Admitting: Physician Assistant

## 2023-10-16 ENCOUNTER — Ambulatory Visit (HOSPITAL_COMMUNITY): Payer: Medicare Other

## 2023-10-16 ENCOUNTER — Ambulatory Visit (INDEPENDENT_AMBULATORY_CARE_PROVIDER_SITE_OTHER)
Admission: RE | Admit: 2023-10-16 | Discharge: 2023-10-16 | Disposition: A | Discharge status: Home / Self Care | Payer: Medicare Other | Source: Ambulatory Visit | Attending: Vascular Surgery

## 2023-10-16 ENCOUNTER — Ambulatory Visit (HOSPITAL_COMMUNITY)
Admission: RE | Admit: 2023-10-16 | Discharge: 2023-10-16 | Disposition: A | Discharge status: Home / Self Care | Payer: Medicare Other | Source: Ambulatory Visit | Attending: Vascular Surgery | Admitting: Vascular Surgery

## 2023-10-16 VITALS — BP 149/89 | HR 73 | Temp 98.2°F | Resp 20 | Ht 65.0 in

## 2023-10-16 DIAGNOSIS — I739 Peripheral vascular disease, unspecified: Secondary | ICD-10-CM

## 2023-10-16 DIAGNOSIS — I70229 Atherosclerosis of native arteries of extremities with rest pain, unspecified extremity: Secondary | ICD-10-CM | POA: Diagnosis not present

## 2023-10-16 DIAGNOSIS — Z23 Encounter for immunization: Secondary | ICD-10-CM | POA: Diagnosis not present

## 2023-10-16 LAB — VAS US ABI WITH/WO TBI
Left ABI: 0.93
Right ABI: 0.89

## 2023-10-16 NOTE — Progress Notes (Signed)
Office Note     CC:  follow up Requesting Provider:  Melida Quitter, MD  HPI: Brenda Carney is a 72 y.o. (09-May-1951) female who presents for surveillance of PAD.  She is well-known to [facility] with numerous interventions of bilateral lower extremities.  She underwent right above to below the knee popliteal bypass with GSV by Dr. Johny Drilling in 2018.  She underwent right SFA angioplasty and stent placement x 2 on 08/31/2022 for recurrent high-grade stenosis in the SFA proximal to her bypass by Dr. Chestine Spore.  She then underwent left common femoral and SFA endarterectomy on 09/11/2022 due to lifestyle limiting claudication by Dr. Chestine Spore.  Most recently she underwent left SFA lithotripsy and stenting on 12/07/2022 due to lifestyle limiting claudication by Dr. Chestine Spore.  She denies any claudication symptoms.  She also is without rest pain or tissue loss of bilateral lower extremities.  She takes an aspirin and Plavix daily.  She has an allergy to statins.  Patient states she is an on and off smoker.   Past Medical History:  Diagnosis Date   Arthritis    "thumbs; hips" (05/14/2017)   CAD S/P 2 V PCI (pRCA, PCx-OM)    Coronary calcium scoring: June 2018: Score 1106. 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   Cervical spondylosis    Chronic lower back pain    COPD (chronic obstructive pulmonary disease) (HCC)    "little bit" (05/14/2017)   Depression    Exercise-induced asthma with acute exacerbation    "only in very hot weather" (05/14/2017)   GERD (gastroesophageal reflux disease)    History of IBS    resovlved   Hx of multiple concussions    from horse training and riding   Hyperlipidemia with target LDL less than 70    Mostly statin intolerant,.  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   Pneumonia 2020   Sleep apnea    does not wear CPAP; "think it was medication related" (05/14/2017)   Stroke [facility]) 2010   TIA   Thoracic aortic ectasia (HCC)    ~40 mm on Coronary Calcium Score CT - recommend f/u CTA or MRA   TIA (transient ischemic attack) 09/2011   "several"    Past Surgical History:  Procedure Laterality Date   ABDOMINAL AORTOGRAM W/LOWER EXTREMITY N/A 05/14/2017   Procedure: Abdominal Aortogram w/Lower Extremity;  Surgeon: Fransisco Hertz, MD;  Location: [facility] INVASIVE CV LAB;  Service: Cardiovascular;  Laterality: N/A;   ABDOMINAL AORTOGRAM W/LOWER EXTREMITY N/A 08/31/2022   Procedure: ABDOMINAL AORTOGRAM W/LOWER EXTREMITY;  Surgeon: Cephus Shelling, MD;  Location: [facility] INVASIVE CV LAB;  Service: Cardiovascular;  Laterality: N/A;   ABDOMINAL AORTOGRAM W/LOWER EXTREMITY N/A 12/07/2022   Procedure: ABDOMINAL AORTOGRAM W/LOWER EXTREMITY;  Surgeon: Cephus Shelling, MD;  Location: [facility] INVASIVE CV LAB;  Service: Cardiovascular;  Laterality: N/A;   ACROMIO-CLAVICULAR JOINT REPAIR Left 1990s   "I think it was called an [facility] joint removal"   BACK SURGERY     BYPASS GRAFT POPLITEAL TO POPLITEAL Right 05/15/2017   Procedure: BYPASS GRAFT  ABOVE KNEE POPLITEAL TO BELOW KNEE POPLITEAL ARTERY;  Surgeon: Fransisco Hertz, MD;  Location: [facility] OR;  Service: Vascular;  Laterality: Right;   CARDIAC CATHETERIZATION     CORONARY STENT INTERVENTION N/A 09/30/2021   Procedure: CORONARY STENT INTERVENTION;  Surgeon: Marykay Lex, MD;  Location: [facility] INVASIVE CV LAB;  Service: Cardiovascular;  Laterality: N/A;   CORONARY ULTRASOUND/IVUS N/A 09/30/2021   Procedure: Intravascular Ultrasound/IVUS;  Surgeon: Marykay Lex, MD;  Location: [facility] INVASIVE CV LAB;  Service: Cardiovascular;   Laterality: N/A;   ENDARTERECTOMY FEMORAL Left 09/11/2022   Procedure: LEFT COMMON FEMORAL ENDARTERECTOMY;  Surgeon: Cephus Shelling, MD;  Location: [facility] OR;  Service: Vascular;  Laterality: Left;   ESOPHAGOGASTRODUODENOSCOPY (EGD) WITH PROPOFOL N/A 04/29/2018   Procedure: ESOPHAGOGASTRODUODENOSCOPY (EGD) WITH PROPOFOL;  Surgeon: Kathi Der, MD;  Location: [facility] ENDOSCOPY;  Service: Gastroenterology;  Laterality: N/A;   FOOT FRACTURE SURGERY Bilateral    multiple procedures   FRACTURE SURGERY     JOINT REPLACEMENT     LEFT HEART CATH AND CORONARY ANGIOGRAPHY N/A 09/30/2021   Procedure: LEFT HEART CATH AND CORONARY ANGIOGRAPHY;  Surgeon: Marykay Lex, MD;  Location: [facility] INVASIVE CV LAB;  Service: Cardiovascular;  Laterality: N/A;   LOWER EXTREMITY ANGIOGRAPHY N/A 06/16/2020   Procedure: LOWER EXTREMITY ANGIOGRAPHY;  Surgeon: Cephus Shelling, MD;  Location: [facility] INVASIVE CV LAB;  Service: Cardiovascular;  Laterality: N/A;   MAXIMUM ACCESS (MAS)POSTERIOR LUMBAR INTERBODY FUSION (PLIF) 1 LEVEL N/A 09/30/2014   Procedure: FOR MAXIMUM ACCESS (MAS) POSTERIOR LUMBAR INTERBODY FUSION (PLIF) 1 LEVEL;  Surgeon: Tia Alert, MD;  Location: [facility] NEURO ORS;  Service: Neurosurgery;  Laterality: N/A;  FOR MAXIMUM ACCESS (MAS) POSTERIOR LUMBAR INTERBODY FUSION (PLIF) 1 LEVEL LUMBAR 4-5   MULTIPLE TOOTH EXTRACTIONS Right 04/2017   NM MYOVIEW LTD  06/2017    Normal EF, 66%. No ST changes. Normal study. LOW RISK.   PATCH ANGIOPLASTY Left 09/11/2022   Procedure: LEFT FEMORAL PATCH ANGIOPLASTY USING XENOSURE BIOLOGIC PATCH 1CMX14CM;  Surgeon: Cephus Shelling, MD;  Location: [facility] OR;  Service: Vascular;  Laterality: Left;   PERIPHERAL VASCULAR ATHERECTOMY Left 06/16/2020   Procedure: PERIPHERAL VASCULAR ATHERECTOMY;  Surgeon: Cephus Shelling, MD;  Location: [facility] INVASIVE CV LAB;  Service: Cardiovascular;  Laterality: Left;  SFA   PERIPHERAL VASCULAR ATHERECTOMY Left 12/07/2022   Procedure:  PERIPHERAL VASCULAR ATHERECTOMY;  Surgeon: Cephus Shelling, MD;  Location: [facility] INVASIVE CV LAB;  Service: Cardiovascular;  Laterality: Left;   PERIPHERAL VASCULAR INTERVENTION Right 08/31/2022   Procedure: PERIPHERAL VASCULAR INTERVENTION;  Surgeon: Cephus Shelling, MD;  Location: [facility] INVASIVE CV LAB;  Service: Cardiovascular;  Laterality: Right;   PERIPHERAL VASCULAR INTERVENTION  12/07/2022   Procedure: PERIPHERAL VASCULAR INTERVENTION;  Surgeon: Cephus Shelling, MD;  Location: [facility] INVASIVE CV LAB;  Service: Cardiovascular;;   TOTAL HIP ARTHROPLASTY Right 01/16/2020   Procedure: RIGHT TOTAL HIP ARTHROPLASTY ANTERIOR APPROACH;  Surgeon: Kathryne Hitch, MD;  Location: [facility] ORS;  Service: Orthopedics;  Laterality: Right;   TOTAL HIP ARTHROPLASTY Left 04/30/2020   Procedure: LEFT TOTAL HIP ARTHROPLASTY ANTERIOR APPROACH;  Surgeon: Kathryne Hitch, MD;  Location: [facility] ORS;  Service: Orthopedics;  Laterality: Left;   TRANSTHORACIC ECHOCARDIOGRAM  09/07/2013   Done to evaluate TIA: Normal LV size and function. EF 55-60%. GR 1 DD. Mild left atrial dilation. Mildly calcified mitral leaflets. Otherwise normal.    Social History   Socioeconomic History   Marital status: Widowed    Spouse name: Iantha Fallen  Number of children: 0   Years of education: COLLEGE   Highest education level: Some college, no degree  Occupational History   Occupation: Physiological scientist   Tobacco Use   Smoking status: Some Days    Current packs/day: 0.00    Types: Cigarettes    Passive exposure: Never   Smokeless tobacco: Never   Tobacco comments:    [name] says she smokes one cigarette every couple of weeks. [name] says she has a lot of friends who still smoke  Vaping Use   Vaping status: Former  Substance and Sexual Activity   Alcohol use: Yes    Alcohol/week: 2.0 standard drinks of alcohol    Types: 2 Glasses of wine per week    Comment: wine   Drug use: No   Sexual activity: Not Currently    Birth  control/protection: Post-menopausal  Other Topics Concern   Not on file  Social History Narrative   Patient lives Alone is a Widow Patient has no children.    Patient is a Physiological scientist.    Patient is right handed.    Patient has BA degree.    Smokes 1 cigarette every couple of weeks.   Social Determinants of Health   Financial Resource Strain: Not on file  Food Insecurity: Not on file  Transportation Needs: Not on file  Physical Activity: Not on file  Stress: Not on file  Social Connections: Not on file  Intimate Partner Violence: Not on file    Family History  Problem Relation Age of Onset   Stroke Father    Heart attack Mother    Stroke Mother     Current Outpatient Medications  Medication Sig Dispense Refill   albuterol (VENTOLIN HFA) 108 (90 Base) MCG/ACT inhaler Inhale 2 puffs into the lungs daily as needed for shortness of breath.     amLODipine (NORVASC) 10 MG tablet Take 10 mg by mouth in the morning.     Apoaequorin (PREVAGEN PO) Take 1 capsule by mouth 3 (three) times a week. (Patient not taking: Reported on 04/10/2023)     ARIPiprazole (ABILIFY) 2 MG tablet Take 2 mg by mouth every morning.     Ascorbic Acid (VITAMIN C) 1000 MG tablet Take 1,000 mg by mouth 3 (three) times a week.     aspirin EC 81 MG tablet Take 81 mg by mouth in the morning.     bisoprolol (ZEBETA) 5 MG tablet Take 1 tablet (5 mg total) by mouth daily. 90 tablet 1   Brimonidine Tartrate (LUMIFY) 0.025 % SOLN Place 1 drop into both eyes in the morning.     buPROPion (WELLBUTRIN XL) 150 MG 24 hr tablet Take 150 mg by mouth every morning.     Calcium Carbonate-Vitamin D (CALTRATE 600+D PO) Take 1 tablet by mouth 2 (two) times a week.     clopidogrel (PLAVIX) 75 MG tablet TAKE ONE TABLET BY MOUTH DAILY 90 tablet 0   desvenlafaxine (PRISTIQ) 100 MG 24 hr tablet Take 100 mg by mouth in the morning.     diazepam (VALIUM) 10 MG tablet Take 5-10 mg by mouth at bedtime as needed for sleep or anxiety.      Evolocumab (REPATHA SURECLICK) 140 MG/ML SOAJ INJECT CONTENTS OF 1 PEN SUBCUTANEOUSLY EVERY 14 DAYS. 6 mL 0   lisinopril (ZESTRIL) 10 MG tablet Take 10 mg by mouth in the morning.     Multiple Vitamins-Minerals (CENTRUM SILVER 50+WOMEN) TABS Take 1 tablet by mouth 4 (four) times a  week. In the morning.     naphazoline-pheniramine (VISINE) 0.025-0.3 % ophthalmic solution Place 1 drop into both eyes 4 (four) times daily as needed (dry/irritated eyes.).     oxyCODONE-acetaminophen (PERCOCET) 10-325 MG tablet 1 tablet Orally Max. of 4 times daily, for severe pain ONLY for 30 days     prazosin (MINIPRESS) 1 MG capsule Take 1 mg by mouth at bedtime.     Propylhexedrine (BENZEDREX NA) Place 1 spray into the nose 2 (two) times daily as needed (nasal congestion.).     tiotropium (SPIRIVA HANDIHALER) 18 MCG inhalation capsule Place 18 mcg into inhaler and inhale daily as needed (Shortness of breath).     traZODone (DESYREL) 50 MG tablet Take 50 mg by mouth at bedtime.     No current facility-administered medications for this visit.    Allergies  Allergen Reactions   Penicillins Swelling and Other (See Comments)    TONGUE SWELLING  PATIENT HAD A PCN REACTION WITH IMMEDIATE RASH, FACIAL/TONGUE/THROAT SWELLING, SOB, OR LIGHTHEADEDNESS WITH HYPOTENSION:  #  #  #  YES  #  #  #   Has patient had a PCN reaction causing severe rash involving mucus membranes or skin necrosis: No Has patient had a PCN reaction that required hospitalization: No Has patient had a PCN reaction occurring within the last 10 years: No    Nucynta Er [Tapentadol Hcl Er] Other (See Comments)    Dry mouth   Statins     muscle pain   Statins Support [A-G Pro] Other (See Comments)   Lyrica [Pregabalin] Other (See Comments)    DRY MOUTH     REVIEW OF SYSTEMS:   [X]  denotes positive finding, [ ]  denotes negative finding Cardiac  Comments:  Chest pain or chest pressure:    Shortness of breath upon exertion:    Short of breath  when lying flat:    Irregular heart rhythm:        Vascular    Pain in calf, thigh, or hip brought on by ambulation:    Pain in feet at night that wakes you up from your sleep:     Blood clot in your veins:    Leg swelling:         Pulmonary    Oxygen at home:    Productive cough:     Wheezing:         Neurologic    Sudden weakness in arms or legs:     Sudden numbness in arms or legs:     Sudden onset of difficulty speaking or slurred speech:    Temporary loss of vision in one eye:     Problems with dizziness:         Gastrointestinal    Blood in stool:     Vomited blood:         Genitourinary    Burning when urinating:     Blood in urine:        Psychiatric    Major depression:         Hematologic    Bleeding problems:    Problems with blood clotting too easily:        Skin    Rashes or ulcers:        Constitutional    Fever or chills:      PHYSICAL EXAMINATION:  Vitals:   10/16/23 1412  BP: (!) 149/89  Pulse: 73  Resp: 20  Temp: 98.2 F (36.8 C)  TempSrc: Temporal  SpO2: 92%  Height: 5\' 5"  (1.651 m)    General:  WDWN in NAD; vital signs documented above Gait: Not observed HENT: WNL, normocephalic Pulmonary: normal non-labored breathing , without Rales, rhonchi,  wheezing Cardiac: regular HR Abdomen: soft, NT, no masses Skin: without rashes Vascular Exam/Pulses: Absent right DP pulse; palpable right PT pulse; palpable left DP pulse Extremities: without ischemic changes, without Gangrene , without cellulitis; without open wounds;  Musculoskeletal: no muscle wasting or atrophy  Neurologic: A&O X 3 Psychiatric:  The pt has Normal affect.   Non-Invasive Vascular Imaging:   Bilateral lower extremity arterial duplex: Right SFA stent with low flow velocities into the 20s which are stable from 6 months ago  Left lower extremity arterial duplex without any hemodynamically significant stenosis Left SFA stents widely patent   ABI/TBIToday's  ABIToday's TBIPrevious ABIPrevious TBI  +-------+-----------+-----------+------------+------------+  Right 0.89       0.51       0.99        0.53          +-------+-----------+-----------+------------+------------+  Left  0.93       0.68       0.96        0.64          +-------+-----------+-----------+------------+------------+     ASSESSMENT/PLAN:: 72 y.o. female here for follow up for surveillance of PAD of bilateral lower extremities  Ms. Frimmy Farooqui is a 72 year old female known well to [facility] with numerous interventions of bilateral lower extremities over the years.  Arterial duplex demonstrates low flow velocities through the right SFA stents.  Despite these velocities the bypass remains patent and she does not have any significant drop in ABI or TBI on the right.  Left lower extremity arterial duplex demonstrates widely patent SFA stents and also has a stable ABI and TBI.  She continues to take Plavix and aspirin daily.  She has a statin allergy.  She also continues to be without claudication symptoms.  Right lower extremity arterial duplex findings were discussed with Dr. Chestine Spore.   Given the patient's history as well as ongoing tobacco use, these findings likely represent threatening stenosis to the right SFA stents as well as the above to below the knee popliteal bypass.  Plan will be to proceed with aortogram with right lower extremity runoff and revascularization of right SFA stents.  This will be scheduled with the patient in the next week or so.  She can continue her Plavix perioperatively.  Risks of angiography were discussed with the patient and she is agreeable to proceed.   Emilie Rutter, PA-C [facility] [phone]  Clinic MD:   Chestine Spore

## 2023-10-17 ENCOUNTER — Other Ambulatory Visit: Payer: Self-pay

## 2023-10-17 DIAGNOSIS — I739 Peripheral vascular disease, unspecified: Secondary | ICD-10-CM

## 2023-10-17 DIAGNOSIS — I70229 Atherosclerosis of native arteries of extremities with rest pain, unspecified extremity: Secondary | ICD-10-CM

## 2023-10-25 ENCOUNTER — Encounter (HOSPITAL_COMMUNITY)
Admission: RE | Disposition: A | Discharge status: Home / Self Care | Payer: Self-pay | Source: Home / Self Care | Attending: Vascular Surgery

## 2023-10-25 ENCOUNTER — Other Ambulatory Visit: Payer: Self-pay

## 2023-10-25 ENCOUNTER — Encounter (HOSPITAL_COMMUNITY): Payer: Self-pay | Admitting: Vascular Surgery

## 2023-10-25 ENCOUNTER — Ambulatory Visit (HOSPITAL_COMMUNITY)
Admission: RE | Admit: 2023-10-25 | Discharge: 2023-10-25 | Disposition: A | Discharge status: Home / Self Care | Payer: Medicare Other | Attending: Vascular Surgery | Admitting: Vascular Surgery

## 2023-10-25 DIAGNOSIS — I70211 Atherosclerosis of native arteries of extremities with intermittent claudication, right leg: Secondary | ICD-10-CM | POA: Insufficient documentation

## 2023-10-25 DIAGNOSIS — I739 Peripheral vascular disease, unspecified: Secondary | ICD-10-CM

## 2023-10-25 DIAGNOSIS — T82898A Other specified complication of vascular prosthetic devices, implants and grafts, initial encounter: Secondary | ICD-10-CM | POA: Diagnosis not present

## 2023-10-25 DIAGNOSIS — Z888 Allergy status to other drugs, medicaments and biological substances status: Secondary | ICD-10-CM | POA: Diagnosis not present

## 2023-10-25 DIAGNOSIS — I708 Atherosclerosis of other arteries: Secondary | ICD-10-CM | POA: Insufficient documentation

## 2023-10-25 DIAGNOSIS — Z7982 Long term (current) use of aspirin: Secondary | ICD-10-CM | POA: Insufficient documentation

## 2023-10-25 DIAGNOSIS — I70229 Atherosclerosis of native arteries of extremities with rest pain, unspecified extremity: Secondary | ICD-10-CM

## 2023-10-25 DIAGNOSIS — F17210 Nicotine dependence, cigarettes, uncomplicated: Secondary | ICD-10-CM | POA: Insufficient documentation

## 2023-10-25 DIAGNOSIS — Z7902 Long term (current) use of antithrombotics/antiplatelets: Secondary | ICD-10-CM | POA: Insufficient documentation

## 2023-10-25 HISTORY — PX: PERIPHERAL VASCULAR INTERVENTION: CATH118257

## 2023-10-25 HISTORY — PX: ABDOMINAL AORTOGRAM W/LOWER EXTREMITY: CATH118223

## 2023-10-25 LAB — POCT I-STAT, CHEM 8
BUN: 12 mg/dL (ref 8–23)
Calcium, Ion: 1.18 mmol/L (ref 1.15–1.40)
Chloride: 101 mmol/L (ref 98–111)
Creatinine, Ser: 1 mg/dL (ref 0.44–1.00)
Glucose, Bld: 130 mg/dL — ABNORMAL HIGH (ref 70–99)
HCT: 46 % (ref 36.0–46.0)
Hemoglobin: 15.6 g/dL — ABNORMAL HIGH (ref 12.0–15.0)
Potassium: 3.2 mmol/L — ABNORMAL LOW (ref 3.5–5.1)
Sodium: 138 mmol/L (ref 135–145)
TCO2: 23 mmol/L (ref 22–32)

## 2023-10-25 SURGERY — ABDOMINAL AORTOGRAM W/LOWER EXTREMITY
Anesthesia: LOCAL | Laterality: Right

## 2023-10-25 MED ORDER — IODIXANOL 320 MG/ML IV SOLN
INTRAVENOUS | Status: DC | PRN
  Administered 2023-10-25: 70 mL

## 2023-10-25 MED ORDER — MIDAZOLAM HCL 2 MG/2ML IJ SOLN
INTRAMUSCULAR | Status: DC | PRN
  Administered 2023-10-25 (×2): 1 mg via INTRAVENOUS

## 2023-10-25 MED ORDER — LIDOCAINE HCL (PF) 1 % IJ SOLN
INTRAMUSCULAR | Status: DC | PRN
  Administered 2023-10-25: 15 mL

## 2023-10-25 MED ORDER — FENTANYL CITRATE (PF) 100 MCG/2ML IJ SOLN
INTRAMUSCULAR | Status: DC | PRN
  Administered 2023-10-25 (×2): 25 ug via INTRAVENOUS

## 2023-10-25 MED ORDER — LABETALOL HCL 5 MG/ML IV SOLN
10.0000 mg | INTRAVENOUS | Status: DC | PRN
Start: 2023-10-25 — End: 2023-10-25

## 2023-10-25 MED ORDER — HEPARIN SODIUM (PORCINE) 1000 UNIT/ML IJ SOLN
INTRAMUSCULAR | Status: DC | PRN
  Administered 2023-10-25: 6000 [IU] via INTRAVENOUS

## 2023-10-25 MED ORDER — SODIUM CHLORIDE 0.9% FLUSH: 3.0000 mL | INTRAVENOUS | Status: DC | PRN

## 2023-10-25 MED ORDER — OXYCODONE HCL 5 MG PO TABS
5.0000 mg | ORAL_TABLET | ORAL | Status: DC | PRN
  Administered 2023-10-25: 10 mg via ORAL
  Filled 2023-10-25: qty 2

## 2023-10-25 MED ORDER — ACETAMINOPHEN 325 MG PO TABS
650.0000 mg | ORAL_TABLET | ORAL | Status: DC | PRN
Start: 2023-10-25 — End: 2023-10-25

## 2023-10-25 MED ORDER — HEPARIN SODIUM (PORCINE) 1000 UNIT/ML IJ SOLN
INTRAMUSCULAR | Status: AC
  Filled 2023-10-25: qty 10

## 2023-10-25 MED ORDER — FENTANYL CITRATE (PF) 100 MCG/2ML IJ SOLN
INTRAMUSCULAR | Status: AC
  Filled 2023-10-25: qty 2

## 2023-10-25 MED ORDER — SODIUM CHLORIDE 0.9% FLUSH: 3.0000 mL | Freq: Two times a day (BID) | INTRAVENOUS | Status: DC

## 2023-10-25 MED ORDER — ONDANSETRON HCL 4 MG/2ML IJ SOLN: 4.0000 mg | Freq: Four times a day (QID) | INTRAMUSCULAR | Status: DC | PRN

## 2023-10-25 MED ORDER — MIDAZOLAM HCL 2 MG/2ML IJ SOLN
INTRAMUSCULAR | Status: AC
  Filled 2023-10-25: qty 2

## 2023-10-25 MED ORDER — SODIUM CHLORIDE 0.9 % IV SOLN: 250.0000 mL | INTRAVENOUS | Status: DC | PRN

## 2023-10-25 MED ORDER — SODIUM CHLORIDE 0.9 % IV SOLN: INTRAVENOUS | Status: DC

## 2023-10-25 MED ORDER — HYDRALAZINE HCL 20 MG/ML IJ SOLN: 5.0000 mg | INTRAMUSCULAR | Status: DC | PRN

## 2023-10-25 MED ORDER — HEPARIN (PORCINE) IN NACL 1000-0.9 UT/500ML-% IV SOLN
INTRAVENOUS | Status: DC | PRN
  Administered 2023-10-25: 500 mL

## 2023-10-25 MED ORDER — LIDOCAINE HCL (PF) 1 % IJ SOLN
INTRAMUSCULAR | Status: AC
  Filled 2023-10-25: qty 30

## 2023-10-25 SURGICAL SUPPLY — 19 items
BALLN MUSTANG 6X20X135 (BALLOONS) ×1
BALLOON MUSTANG 6X20X135 (BALLOONS) IMPLANT
CATH OMNI FLUSH 5F 65CM (CATHETERS) IMPLANT
CATH QUICKCROSS SUPP .035X90CM (MICROCATHETER) IMPLANT
COVER DOME SNAP 22 D (MISCELLANEOUS) IMPLANT
DEVICE CLOSURE MYNXGRIP 6/7F (Vascular Products) IMPLANT
DEVICE TORQUE .025-.038 (MISCELLANEOUS) IMPLANT
GUIDEWIRE ANGLED .035X150CM (WIRE) IMPLANT
KIT ENCORE 26 ADVANTAGE (KITS) IMPLANT
KIT MICROPUNCTURE NIT STIFF (SHEATH) IMPLANT
SET ATX-X65L (MISCELLANEOUS) IMPLANT
SHEATH CATAPULT 6FR 45 (SHEATH) IMPLANT
SHEATH PINNACLE 5F 10CM (SHEATH) IMPLANT
SHEATH PINNACLE 6F 10CM (SHEATH) IMPLANT
SHEATH PROBE COVER 6X72 (BAG) IMPLANT
STENT ELUVIA 7X40X130 (Permanent Stent) IMPLANT
TRAY PV CATH (CUSTOM PROCEDURE TRAY) ×1 IMPLANT
WIRE BENTSON .035X145CM (WIRE) IMPLANT
WIRE ROSEN-J .035X260CM (WIRE) IMPLANT

## 2023-10-25 NOTE — Op Note (Signed)
Patient name: Brenda Carney MRN: [id number] DOB: 1951-08-23 Sex: female  10/25/2023 Pre-operative Diagnosis: Threatened right lower extremity bypass with sluggish flow Post-operative diagnosis:  Same Surgeon:  Cephus Shelling, MD Procedure Performed: 1.  Ultrasound-guided access left common femoral artery 2.  Aortogram with catheter selection of aorta 3.  Right lower extremity arteriogram with catheter selection of right SFA 4.  Angioplasty and stent of right external iliac artery for a high-grade greater than 80% stenosis (7 mm x 40 mm drug-coated Eluvia postdilated with a 6 mm angioplasty balloon) 5.  Mynx closure of the left common femoral artery 6.  55 minutes of monitored moderate conscious sedation time  Contrast: 70 mL  Indications: 72 year old female that previously had a right above to below-knee popliteal artery bypass in 2018 with Dr. Imogene Burn with additional right SFA stenting to maintain patency of the bypass.  Recently seen for surveillance and had very sluggish flow in her right leg bypass.  We recommended angiogram to further evaluate.  She presents after risk benefits discussed.  Findings:   Ultrasound guided access left common femoral artery at the site of previous endarterectomy.  Abdominal aortogram showed the infrarenal aorta is calcified and diseased but patent and and both renals are patent.  On the right which is the side of interest she has a high-grade calcified stenosis in the external iliac artery at the proximal site of previous femoral endarterectomy.  This pressure gradient across the right external iliac artery stenosis was 60 mmHg.  Distally the right common femoral and profunda as well as the right SFA and right SFA stents were widely patent.  The right above to below-knee popliteal artery bypass is widely patent.  Dominant runoff is in the posterior tibial.  Ultimately the right external iliac high-grade stenosis was stented with a 7 mm x 40 mm  drug-coated Eluvia postdilated with a 6 mm angioplasty balloon.  At completion there was no gradient compared to the 60 point pressure gradient prior to intervention.  Widely patent iliac stent with preserved runoff in the bypass distally.   Procedure:  The patient was identified in the holding area and taken to room 8.  The patient was then placed supine on the table and prepped and draped in the usual sterile fashion.  A time out was called.  Patient received Versed and fentanyl for conscious moderate sedation.  Vital signs were monitored including heart rate, respiratory rate, oxygenation and blood pressure.  I was present for all moderate sedation.  Ultrasound was used to evaluate the left common femoral artery.  It was patent .  A digital ultrasound image was acquired.  A micropuncture needle was used to access the left common femoral artery under ultrasound guidance.  An 018 wire was advanced without resistance and a micropuncture sheath was placed.  The 018 wire was removed and a benson wire was placed.  The micropuncture sheath was exchanged for a 5 french sheath.  An omniflush catheter was advanced over the wire to the level of L-1.  An abdominal angiogram was obtained.  Next, using the omniflush catheter and a benson wire, the aortic bifurcation was crossed and the catheter was placed into theright external iliac artery and right runoff was obtained.  Ultimately after evaluating images we were concerned about a right external iliac calcified lesion.  I did a pullback pressure here that showed a 60 mm Hg gradient where the pressure was 40 mmHg distal to the lesion and 100 mm Hg proximal  the lesion in the right external iliac.  We then elected to stent this.  I used a 6 [place] catapult sheath in the left groin over the aortic bifurcation.  She was given 100 units/kg IV heparin.  I used a Glidewire to get distal to the lesion and exchanged for a J-wire for more support.  We then stented the lesion in the  right external iliac with a 7 mm x 40 mm drug-coated Eluvia postdilated with a 6 mm balloon.  I then got a quick cross catheter and did a final pullback pressure that was no further gradient after stenting.  I saw that the distal runoff with preserved with runoff in the bypass.  Wires and catheters were removed and put a 6 French sheath in the left groin.  A mynx closure device was deployed.  Plan:  Excellent results after right external iliac stenting.  Aspirin and plavix (cannot tolerate statin).  Will arrange follow-up in 1 month with noninvasive imaging.    Cephus Shelling, MD [facility] [place] Office: [phone]

## 2023-10-25 NOTE — H&P (Signed)
History and Physical Interval Note:  10/25/2023 10:41 AM  Brenda Carney  has presented today for surgery, with the diagnosis of ichemia of right lower extemity.  The various methods of treatment have been discussed with the patient and family. After consideration of risks, benefits and other options for treatment, the patient has consented to  Procedure(s): ABDOMINAL AORTOGRAM W/LOWER EXTREMITY (N/A) as a surgical intervention.  The patient's history has been reviewed, patient examined, no change in status, stable for surgery.  I have reviewed the patient's chart and labs.  Questions were answered to the patient's satisfaction.     Cephus Shelling   Office Note        CC:  follow up Requesting Provider:  Melida Quitter, MD   HPI: Brenda Carney is a 72 y.o. (1951-05-17) female who presents for surveillance of PAD.  She is well-known to [facility] with numerous interventions of bilateral lower extremities.  She underwent right above to below the knee popliteal bypass with GSV by Dr. Johny Drilling in 2018.  She underwent right SFA angioplasty and stent placement x 2 on 08/31/2022 for recurrent high-grade stenosis in the SFA proximal to her bypass by Dr. Chestine Spore.  She then underwent left common femoral and SFA endarterectomy on 09/11/2022 due to lifestyle limiting claudication by Dr. Chestine Spore.  Most recently she underwent left SFA lithotripsy and stenting on 12/07/2022 due to lifestyle limiting claudication by Dr. Chestine Spore.  She denies any claudication symptoms.  She also is without rest pain or tissue loss of bilateral lower extremities.  She takes an aspirin and Plavix daily.  She has an allergy to statins.  Patient states she is an on and off smoker.         Past Medical History:  Diagnosis Date   Arthritis      "thumbs; hips" (05/14/2017)   CAD S/P 2 V PCI (pRCA, PCx-OM)      Coronary calcium scoring: June 2018: Score 1106. 98 percentile for age -> LOW RISK MYOVIEW   Cerebral aneurysm      Right ophthalmic  artery, left callosal marginal anterior cerebral artery branch   Cerebrovascular disease      Carotid dopplers which showed no significant increase in velocities, extremely minimal on the left, antegrade vertebral bilaterally.   Cervical spondylosis     Chronic lower back pain     COPD (chronic obstructive pulmonary disease) (HCC)      "little bit" (05/14/2017)   Depression     Exercise-induced asthma with acute exacerbation      "only in very hot weather" (05/14/2017)   GERD (gastroesophageal reflux disease)     History of IBS      resovlved   Hx of multiple concussions      from horse training and riding   Hyperlipidemia with target LDL less than 70      Mostly statin intolerant,. Currently on Livalo 4 mg   Hypertension, benign     Lumbosacral spondylosis     PAD (peripheral artery disease) (HCC)      Status post right above-the-knee-below the knee popliteal artery bypass; postop right ABI 0.96.   Pneumonia 2020   Sleep apnea      does not wear CPAP; "think it was medication related" (05/14/2017)   Stroke [facility]) 2010    TIA   Thoracic aortic ectasia (HCC)      ~40 mm on Coronary Calcium Score CT - recommend f/u CTA or MRA   TIA (transient ischemic attack)  09/2011    "several"               Past Surgical History:  Procedure Laterality Date   ABDOMINAL AORTOGRAM W/LOWER EXTREMITY N/A 05/14/2017    Procedure: Abdominal Aortogram w/Lower Extremity;  Surgeon: Fransisco Hertz, MD;  Location: [facility] INVASIVE CV LAB;  Service: Cardiovascular;  Laterality: N/A;   ABDOMINAL AORTOGRAM W/LOWER EXTREMITY N/A 08/31/2022    Procedure: ABDOMINAL AORTOGRAM W/LOWER EXTREMITY;  Surgeon: Cephus Shelling, MD;  Location: [facility] INVASIVE CV LAB;  Service: Cardiovascular;  Laterality: N/A;   ABDOMINAL AORTOGRAM W/LOWER EXTREMITY N/A 12/07/2022    Procedure: ABDOMINAL AORTOGRAM W/LOWER EXTREMITY;  Surgeon: Cephus Shelling, MD;  Location: [facility] INVASIVE CV LAB;  Service: Cardiovascular;  Laterality: N/A;    ACROMIO-CLAVICULAR JOINT REPAIR Left 1990s    "I think it was called an [facility] joint removal"   BACK SURGERY       BYPASS GRAFT POPLITEAL TO POPLITEAL Right 05/15/2017    Procedure: BYPASS GRAFT ABOVE KNEE POPLITEAL TO BELOW KNEE POPLITEAL ARTERY;  Surgeon: Fransisco Hertz, MD;  Location: [facility] OR;  Service: Vascular;  Laterality: Right;   CARDIAC CATHETERIZATION       CORONARY STENT INTERVENTION N/A 09/30/2021    Procedure: CORONARY STENT INTERVENTION;  Surgeon: Marykay Lex, MD;  Location: [facility] INVASIVE CV LAB;  Service: Cardiovascular;  Laterality: N/A;   CORONARY ULTRASOUND/IVUS N/A 09/30/2021    Procedure: Intravascular Ultrasound/IVUS;  Surgeon: Marykay Lex, MD;  Location: [facility] INVASIVE CV LAB;  Service: Cardiovascular;  Laterality: N/A;   ENDARTERECTOMY FEMORAL Left 09/11/2022    Procedure: LEFT COMMON FEMORAL ENDARTERECTOMY;  Surgeon: Cephus Shelling, MD;  Location: [facility] OR;  Service: Vascular;  Laterality: Left;   ESOPHAGOGASTRODUODENOSCOPY (EGD) WITH PROPOFOL N/A 04/29/2018    Procedure: ESOPHAGOGASTRODUODENOSCOPY (EGD) WITH PROPOFOL;  Surgeon: Kathi Der, MD;  Location: [facility] ENDOSCOPY;  Service: Gastroenterology;  Laterality: N/A;   FOOT FRACTURE SURGERY Bilateral      multiple procedures   FRACTURE SURGERY       JOINT REPLACEMENT       LEFT HEART CATH AND CORONARY ANGIOGRAPHY N/A 09/30/2021    Procedure: LEFT HEART CATH AND CORONARY ANGIOGRAPHY;  Surgeon: Marykay Lex, MD;  Location: [facility] INVASIVE CV LAB;  Service: Cardiovascular;  Laterality: N/A;   LOWER EXTREMITY ANGIOGRAPHY N/A 06/16/2020    Procedure: LOWER EXTREMITY ANGIOGRAPHY;  Surgeon: Cephus Shelling, MD;  Location: [facility] INVASIVE CV LAB;  Service: Cardiovascular;  Laterality: N/A;   MAXIMUM ACCESS (MAS)POSTERIOR LUMBAR INTERBODY FUSION (PLIF) 1 LEVEL N/A 09/30/2014    Procedure: FOR MAXIMUM ACCESS (MAS) POSTERIOR LUMBAR INTERBODY FUSION (PLIF) 1 LEVEL;  Surgeon: Tia Alert, MD;  Location: [facility] NEURO ORS;   Service: Neurosurgery;  Laterality: N/A;  FOR MAXIMUM ACCESS (MAS) POSTERIOR LUMBAR INTERBODY FUSION (PLIF) 1 LEVEL LUMBAR 4-5   MULTIPLE TOOTH EXTRACTIONS Right 04/2017   NM MYOVIEW LTD   06/2017     Normal EF, 66%. No ST changes. Normal study. LOW RISK.   PATCH ANGIOPLASTY Left 09/11/2022    Procedure: LEFT FEMORAL PATCH ANGIOPLASTY USING XENOSURE BIOLOGIC PATCH 1CMX14CM;  Surgeon: Cephus Shelling, MD;  Location: [facility] OR;  Service: Vascular;  Laterality: Left;   PERIPHERAL VASCULAR ATHERECTOMY Left 06/16/2020    Procedure: PERIPHERAL VASCULAR ATHERECTOMY;  Surgeon: Cephus Shelling, MD;  Location: [facility] INVASIVE CV LAB;  Service: Cardiovascular;  Laterality: Left;  SFA   PERIPHERAL VASCULAR ATHERECTOMY Left 12/07/2022    Procedure: PERIPHERAL VASCULAR ATHERECTOMY;  Surgeon: Chestine Spore,  Canary Brim, MD;  Location: [facility] INVASIVE CV LAB;  Service: Cardiovascular;  Laterality: Left;   PERIPHERAL VASCULAR INTERVENTION Right 08/31/2022    Procedure: PERIPHERAL VASCULAR INTERVENTION;  Surgeon: Cephus Shelling, MD;  Location: [facility] INVASIVE CV LAB;  Service: Cardiovascular;  Laterality: Right;   PERIPHERAL VASCULAR INTERVENTION   12/07/2022    Procedure: PERIPHERAL VASCULAR INTERVENTION;  Surgeon: Cephus Shelling, MD;  Location: [facility] INVASIVE CV LAB;  Service: Cardiovascular;;   TOTAL HIP ARTHROPLASTY Right 01/16/2020    Procedure: RIGHT TOTAL HIP ARTHROPLASTY ANTERIOR APPROACH;  Surgeon: Kathryne Hitch, MD;  Location: [facility] ORS;  Service: Orthopedics;  Laterality: Right;   TOTAL HIP ARTHROPLASTY Left 04/30/2020    Procedure: LEFT TOTAL HIP ARTHROPLASTY ANTERIOR APPROACH;  Surgeon: Kathryne Hitch, MD;  Location: [facility] ORS;  Service: Orthopedics;  Laterality: Left;   TRANSTHORACIC ECHOCARDIOGRAM   09/07/2013    Done to evaluate TIA: Normal LV size and function. EF 55-60%. GR 1 DD. Mild left atrial dilation. Mildly calcified mitral leaflets. Otherwise normal.          Social History          Socioeconomic History   Marital status: Widowed      Spouse name: Iantha Fallen    Number of children: 0   Years of education: COLLEGE   Highest education level: Some college, no degree  Occupational History   Occupation: Physiological scientist   Tobacco Use   Smoking status: Some Days      Current packs/day: 0.00      Types: Cigarettes      Passive exposure: Never   Smokeless tobacco: Never   Tobacco comments:      [name] says she smokes one cigarette every couple of weeks. [name] says she has a lot of friends who still smoke  Vaping Use   Vaping status: Former  Substance and Sexual Activity   Alcohol use: Yes      Alcohol/week: 2.0 standard drinks of alcohol      Types: 2 Glasses of wine per week      Comment: wine   Drug use: No   Sexual activity: Not Currently      Birth control/protection: Post-menopausal  Other Topics Concern   Not on file  Social History Narrative    Patient lives Alone is a Widow Patient has no children.     Patient is a Physiological scientist.     Patient is right handed.     Patient has BA degree.     Smokes 1 cigarette every couple of weeks.    Social Determinants of Health    Financial Resource Strain: Not on file  Food Insecurity: Not on file  Transportation Needs: Not on file  Physical Activity: Not on file  Stress: Not on file  Social Connections: Not on file  Intimate Partner Violence: Not on file           Family History  Problem Relation Age of Onset   Stroke Father     Heart attack Mother     Stroke Mother                  Current Outpatient Medications  Medication Sig Dispense Refill   albuterol (VENTOLIN HFA) 108 (90 Base) MCG/ACT inhaler Inhale 2 puffs into the lungs daily as needed for shortness of breath.       amLODipine (NORVASC) 10 MG tablet Take 10 mg by mouth in the morning.       Apoaequorin (  PREVAGEN PO) Take 1 capsule by mouth 3 (three) times a week. (Patient not taking: Reported on 04/10/2023)       ARIPiprazole (ABILIFY) 2 MG  tablet Take 2 mg by mouth every morning.       Ascorbic Acid (VITAMIN C) 1000 MG tablet Take 1,000 mg by mouth 3 (three) times a week.       aspirin EC 81 MG tablet Take 81 mg by mouth in the morning.       bisoprolol (ZEBETA) 5 MG tablet Take 1 tablet (5 mg total) by mouth daily. 90 tablet 1   Brimonidine Tartrate (LUMIFY) 0.025 % SOLN Place 1 drop into both eyes in the morning.       buPROPion (WELLBUTRIN XL) 150 MG 24 hr tablet Take 150 mg by mouth every morning.       Calcium Carbonate-Vitamin D (CALTRATE 600+D PO) Take 1 tablet by mouth 2 (two) times a week.       clopidogrel (PLAVIX) 75 MG tablet TAKE ONE TABLET BY MOUTH DAILY 90 tablet 0   desvenlafaxine (PRISTIQ) 100 MG 24 hr tablet Take 100 mg by mouth in the morning.       diazepam (VALIUM) 10 MG tablet Take 5-10 mg by mouth at bedtime as needed for sleep or anxiety.       Evolocumab (REPATHA SURECLICK) 140 MG/ML SOAJ INJECT CONTENTS OF 1 PEN SUBCUTANEOUSLY EVERY 14 DAYS. 6 mL 0   lisinopril (ZESTRIL) 10 MG tablet Take 10 mg by mouth in the morning.       Multiple Vitamins-Minerals (CENTRUM SILVER 50+WOMEN) TABS Take 1 tablet by mouth 4 (four) times a week. In the morning.       naphazoline-pheniramine (VISINE) 0.025-0.3 % ophthalmic solution Place 1 drop into both eyes 4 (four) times daily as needed (dry/irritated eyes.).       oxyCODONE-acetaminophen (PERCOCET) 10-325 MG tablet 1 tablet Orally Max. of 4 times daily, for severe pain ONLY for 30 days       prazosin (MINIPRESS) 1 MG capsule Take 1 mg by mouth at bedtime.       Propylhexedrine (BENZEDREX NA) Place 1 spray into the nose 2 (two) times daily as needed (nasal congestion.).       tiotropium (SPIRIVA HANDIHALER) 18 MCG inhalation capsule Place 18 mcg into inhaler and inhale daily as needed (Shortness of breath).       traZODone (DESYREL) 50 MG tablet Take 50 mg by mouth at bedtime.          No current facility-administered medications for this visit.        Allergies        Allergies  Allergen Reactions   Penicillins Swelling and Other (See Comments)      TONGUE SWELLING   PATIENT HAD A PCN REACTION WITH IMMEDIATE RASH, FACIAL/TONGUE/THROAT SWELLING, SOB, OR LIGHTHEADEDNESS WITH HYPOTENSION:  #  #  #  YES  #  #  #   Has patient had a PCN reaction causing severe rash involving mucus membranes or skin necrosis: No Has patient had a PCN reaction that required hospitalization: No Has patient had a PCN reaction occurring within the last 10 years: No     Nucynta Er [Tapentadol Hcl Er] Other (See Comments)      Dry mouth   Statins        muscle pain   Statins Support [A-G Pro] Other (See Comments)   Lyrica [Pregabalin] Other (See Comments)      DRY MOUTH  REVIEW OF SYSTEMS:    [X]  denotes positive finding, [ ]  denotes negative finding Cardiac   Comments:  Chest pain or chest pressure:      Shortness of breath upon exertion:      Short of breath when lying flat:      Irregular heart rhythm:             Vascular      Pain in calf, thigh, or hip brought on by ambulation:      Pain in feet at night that wakes you up from your sleep:       Blood clot in your veins:      Leg swelling:              Pulmonary      Oxygen at home:      Productive cough:       Wheezing:              Neurologic      Sudden weakness in arms or legs:       Sudden numbness in arms or legs:       Sudden onset of difficulty speaking or slurred speech:      Temporary loss of vision in one eye:       Problems with dizziness:              Gastrointestinal      Blood in stool:       Vomited blood:              Genitourinary      Burning when urinating:       Blood in urine:             Psychiatric      Major depression:              Hematologic      Bleeding problems:      Problems with blood clotting too easily:             Skin      Rashes or ulcers:             Constitutional      Fever or chills:          PHYSICAL EXAMINATION:      Vitals:     10/16/23 1412  BP: (!) 149/89  Pulse: 73  Resp: 20  Temp: 98.2 F (36.8 C)  TempSrc: Temporal  SpO2: 92%  Height: 5\' 5"  (1.651 m)      General:  WDWN in NAD; vital signs documented above Gait: Not observed HENT: WNL, normocephalic Pulmonary: normal non-labored breathing , without Rales, rhonchi,  wheezing Cardiac: regular HR Abdomen: soft, NT, no masses Skin: without rashes Vascular Exam/Pulses: Absent right DP pulse; palpable right PT pulse; palpable left DP pulse Extremities: without ischemic changes, without Gangrene , without cellulitis; without open wounds;  Musculoskeletal: no muscle wasting or atrophy       Neurologic: A&O X 3 Psychiatric:  The pt has Normal affect.     Non-Invasive Vascular Imaging:   Bilateral lower extremity arterial duplex: Right SFA stent with low flow velocities into the 20s which are stable from 6 months ago   Left lower extremity arterial duplex without any hemodynamically significant stenosis Left SFA stents widely patent     ABI/TBIToday's ABIToday's TBIPrevious ABIPrevious TBI  +-------+-----------+-----------+------------+------------+  Right 0.89       0.51       0.99  0.53          +-------+-----------+-----------+------------+------------+  Left  0.93       0.68       0.96        0.64          +-------+-----------+-----------+------------+------------+        ASSESSMENT/PLAN:: 72 y.o. female here for follow up for surveillance of PAD of bilateral lower extremities   Brenda Carney is a 72 year old female known well to [facility] with numerous interventions of bilateral lower extremities over the years.  Arterial duplex demonstrates low flow velocities through the right SFA stents.  Despite these velocities the bypass remains patent and she does not have any significant drop in ABI or TBI on the right.  Left lower extremity arterial duplex demonstrates widely patent SFA stents and also has a stable ABI and TBI.  She  continues to take Plavix and aspirin daily.  She has a statin allergy.  She also continues to be without claudication symptoms.  Right lower extremity arterial duplex findings were discussed with Dr. Chestine Spore.   Given the patient's history as well as ongoing tobacco use, these findings likely represent threatening stenosis to the right SFA stents as well as the above to below the knee popliteal bypass.  Plan will be to proceed with aortogram with right lower extremity runoff and revascularization of right SFA stents.  This will be scheduled with the patient in the next week or so.  She can continue her Plavix perioperatively.  Risks of angiography were discussed with the patient and she is agreeable to proceed.     Emilie Rutter, PA-C [facility] [phone]   Clinic MD:   Chestine Spore

## 2023-10-25 NOTE — Discharge Instructions (Signed)
Discussed she needs to take aspirin and Plavix daily.  It says she is only taking Plavix 3 days a week which I discussed is inadequate.  Will arrange follow-up in 1 month.  Cannot tolerate statins.

## 2023-10-26 ENCOUNTER — Telehealth: Payer: Self-pay | Admitting: Pharmacy Technician

## 2023-10-26 NOTE — Telephone Encounter (Signed)
Auth Submission: NO AUTH NEEDED Site of care: Site of care: [facility] Payer: MEDICARE A/B & SUPP Medication & CPT/J Code(s) submitted: Prolia (Denosumab) [id number] Route of submission (phone, fax, portal):  Phone # Fax # Auth type: Buy/Bill PB Units/visits requested: 2 Reference number:  Approval from: 12/25/22 to 12/25/23

## 2023-10-30 DIAGNOSIS — D7589 Other specified diseases of blood and blood-forming organs: Secondary | ICD-10-CM | POA: Diagnosis not present

## 2023-10-30 DIAGNOSIS — Z1389 Encounter for screening for other disorder: Secondary | ICD-10-CM | POA: Diagnosis not present

## 2023-10-30 DIAGNOSIS — I10 Essential (primary) hypertension: Secondary | ICD-10-CM | POA: Diagnosis not present

## 2023-11-06 DIAGNOSIS — I2510 Atherosclerotic heart disease of native coronary artery without angina pectoris: Secondary | ICD-10-CM | POA: Diagnosis not present

## 2023-11-06 DIAGNOSIS — E782 Mixed hyperlipidemia: Secondary | ICD-10-CM | POA: Diagnosis not present

## 2023-11-06 DIAGNOSIS — G729 Myopathy, unspecified: Secondary | ICD-10-CM | POA: Diagnosis not present

## 2023-11-06 DIAGNOSIS — J449 Chronic obstructive pulmonary disease, unspecified: Secondary | ICD-10-CM | POA: Diagnosis not present

## 2023-11-06 DIAGNOSIS — M810 Age-related osteoporosis without current pathological fracture: Secondary | ICD-10-CM | POA: Diagnosis not present

## 2023-11-06 DIAGNOSIS — I719 Aortic aneurysm of unspecified site, without rupture: Secondary | ICD-10-CM | POA: Diagnosis not present

## 2023-11-06 DIAGNOSIS — Z1339 Encounter for screening examination for other mental health and behavioral disorders: Secondary | ICD-10-CM | POA: Diagnosis not present

## 2023-11-06 DIAGNOSIS — G894 Chronic pain syndrome: Secondary | ICD-10-CM | POA: Diagnosis not present

## 2023-11-06 DIAGNOSIS — Z0000 Encounter for general adult medical examination without abnormal findings: Secondary | ICD-10-CM | POA: Diagnosis not present

## 2023-11-06 DIAGNOSIS — I10 Essential (primary) hypertension: Secondary | ICD-10-CM | POA: Diagnosis not present

## 2023-11-06 DIAGNOSIS — Z1331 Encounter for screening for depression: Secondary | ICD-10-CM | POA: Diagnosis not present

## 2023-11-06 DIAGNOSIS — I739 Peripheral vascular disease, unspecified: Secondary | ICD-10-CM | POA: Diagnosis not present

## 2023-11-06 DIAGNOSIS — I77810 Thoracic aortic ectasia: Secondary | ICD-10-CM | POA: Diagnosis not present

## 2023-11-06 DIAGNOSIS — I700 Atherosclerosis of aorta: Secondary | ICD-10-CM | POA: Diagnosis not present

## 2023-11-08 DIAGNOSIS — G894 Chronic pain syndrome: Secondary | ICD-10-CM | POA: Diagnosis not present

## 2023-11-08 DIAGNOSIS — Z79891 Long term (current) use of opiate analgesic: Secondary | ICD-10-CM | POA: Diagnosis not present

## 2023-11-08 DIAGNOSIS — M545 Low back pain, unspecified: Secondary | ICD-10-CM | POA: Diagnosis not present

## 2023-11-08 DIAGNOSIS — M542 Cervicalgia: Secondary | ICD-10-CM | POA: Diagnosis not present

## 2023-11-08 DIAGNOSIS — M47812 Spondylosis without myelopathy or radiculopathy, cervical region: Secondary | ICD-10-CM | POA: Diagnosis not present

## 2023-11-08 DIAGNOSIS — M5116 Intervertebral disc disorders with radiculopathy, lumbar region: Secondary | ICD-10-CM | POA: Diagnosis not present

## 2023-11-26 ENCOUNTER — Other Ambulatory Visit: Payer: Self-pay | Admitting: *Deleted

## 2023-11-26 DIAGNOSIS — H43811 Vitreous degeneration, right eye: Secondary | ICD-10-CM | POA: Diagnosis not present

## 2023-11-26 DIAGNOSIS — I70229 Atherosclerosis of native arteries of extremities with rest pain, unspecified extremity: Secondary | ICD-10-CM

## 2023-11-26 DIAGNOSIS — Z961 Presence of intraocular lens: Secondary | ICD-10-CM | POA: Diagnosis not present

## 2023-11-26 DIAGNOSIS — I739 Peripheral vascular disease, unspecified: Secondary | ICD-10-CM

## 2023-11-28 ENCOUNTER — Ambulatory Visit: Payer: Medicare Other

## 2023-11-28 VITALS — BP 149/91 | HR 86 | Temp 97.9°F | Resp 16 | Ht 65.0 in | Wt 129.6 lb

## 2023-11-28 DIAGNOSIS — M810 Age-related osteoporosis without current pathological fracture: Secondary | ICD-10-CM

## 2023-11-28 MED ORDER — DENOSUMAB 60 MG/ML ~~LOC~~ SOSY
60.0000 mg | PREFILLED_SYRINGE | Freq: Once | SUBCUTANEOUS | Status: AC
  Administered 2023-11-28: 60 mg via SUBCUTANEOUS
  Filled 2023-11-28: qty 1

## 2023-11-28 NOTE — Progress Notes (Signed)
Diagnosis: Osteoporosis  Provider:  Mannam, Praveen MD  Procedure: Injection  Prolia (Denosumab), Dose: 60 mg, Site: subcutaneous, Number of injections: 1   Discharge: Condition: Good, Destination: Home . AVS Provided  Performed by:  Seva Chancy, RN       

## 2023-12-07 ENCOUNTER — Ambulatory Visit (INDEPENDENT_AMBULATORY_CARE_PROVIDER_SITE_OTHER)
Admission: RE | Admit: 2023-12-07 | Discharge: 2023-12-07 | Disposition: A | Discharge status: Home / Self Care | Payer: Medicare Other | Source: Ambulatory Visit | Attending: Vascular Surgery | Admitting: Vascular Surgery

## 2023-12-07 ENCOUNTER — Ambulatory Visit (HOSPITAL_COMMUNITY)
Admission: RE | Admit: 2023-12-07 | Discharge: 2023-12-07 | Disposition: A | Discharge status: Home / Self Care | Payer: Medicare Other | Source: Ambulatory Visit | Attending: Vascular Surgery | Admitting: Vascular Surgery

## 2023-12-07 DIAGNOSIS — I739 Peripheral vascular disease, unspecified: Secondary | ICD-10-CM | POA: Insufficient documentation

## 2023-12-07 DIAGNOSIS — I70229 Atherosclerosis of native arteries of extremities with rest pain, unspecified extremity: Secondary | ICD-10-CM

## 2023-12-07 LAB — VAS US ABI WITH/WO TBI
Left ABI: 0.94
Right ABI: 0.97

## 2023-12-10 NOTE — Progress Notes (Deleted)
Patient name: Brenda Carney MRN: [id number] DOB: [birth year] Sex: female  REASON FOR VISIT: Hospital follow-up  HPI: Brenda Carney is a 72 y.o. female with multiple comorbidities that presents for hospital follow-up.  Most recently on 10/25/2023 she underwent right lower extremity angiogram with stenting of her external iliac artery for an 80% stenosis for a threatened bypass.  She previously had a right above to below-knee popliteal bypass in 2018 with Dr. Imogene Burn.  She most recently underwent left common femoral endarterectomy with a separate left SFA endarterectomy with bovine patch on 09/11/2022 for lifestyle-limiting claudication.  Her groin is healing without issues.  Her calf burning is better.  She also underwent a right SFA angioplasty with stent placement x2 on 08/31/2022 for recurrent high-grade stenosis in the SFA proximal to a remote popliteal bypass performed by Dr. Imogene Burn.   Past Medical History:  Diagnosis Date   Arthritis    "thumbs; hips" (05/14/2017)   CAD S/P 2 V PCI (pRCA, PCx-OM)    Coronary calcium scoring: June 2018: Score 1106. 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   Cervical spondylosis    Chronic lower back pain    COPD (chronic obstructive pulmonary disease) (HCC)    "little bit" (05/14/2017)   Depression    Exercise-induced asthma with acute exacerbation    "only in very hot weather" (05/14/2017)   GERD (gastroesophageal reflux disease)    History of IBS    resovlved   Hx of multiple concussions    from horse training and riding   Hyperlipidemia with target LDL less than 70    Mostly statin intolerant,. Currently on Livalo 4 mg   Hypertension, benign    Lumbosacral spondylosis    PAD  (peripheral artery disease) (HCC)    Status post right above-the-knee-below the knee popliteal artery bypass; postop right ABI 0.96.   Pneumonia 2020   Sleep apnea    does not wear CPAP; "think it was medication related" (05/14/2017)   Stroke [facility]) 2010   TIA   Thoracic aortic ectasia (HCC)    ~40 mm on Coronary Calcium Score CT - recommend f/u CTA or MRA   TIA (transient ischemic attack) 09/2011   "several"    Past Surgical History:  Procedure Laterality Date   ABDOMINAL AORTOGRAM W/LOWER EXTREMITY N/A 05/14/2017   Procedure: Abdominal Aortogram w/Lower Extremity;  Surgeon: Fransisco Hertz, MD;  Location: [facility] INVASIVE CV LAB;  Service: Cardiovascular;  Laterality: N/A;   ABDOMINAL AORTOGRAM W/LOWER EXTREMITY N/A 08/31/2022   Procedure: ABDOMINAL AORTOGRAM W/LOWER EXTREMITY;  Surgeon: Cephus Shelling, MD;  Location: [facility] INVASIVE CV LAB;  Service: Cardiovascular;  Laterality: N/A;   ABDOMINAL AORTOGRAM W/LOWER EXTREMITY N/A 12/07/2022   Procedure: ABDOMINAL AORTOGRAM W/LOWER EXTREMITY;  Surgeon: Cephus Shelling, MD;  Location: [facility] INVASIVE CV LAB;  Service: Cardiovascular;  Laterality: N/A;   ABDOMINAL AORTOGRAM W/LOWER EXTREMITY Right 10/25/2023   Procedure: ABDOMINAL AORTOGRAM W/LOWER EXTREMITY;  Surgeon: Cephus Shelling, MD;  Location: [facility] INVASIVE CV LAB;  Service: Cardiovascular;  Laterality: Right;   ACROMIO-CLAVICULAR JOINT REPAIR Left 1990s   "I think it was called an [facility] joint removal"   BACK SURGERY     BYPASS GRAFT POPLITEAL TO POPLITEAL Right 05/15/2017   Procedure: BYPASS  GRAFT ABOVE KNEE POPLITEAL TO BELOW KNEE POPLITEAL ARTERY;  Surgeon: Fransisco Hertz, MD;  Location: [facility] OR;  Service: Vascular;  Laterality: Right;   CARDIAC CATHETERIZATION     CORONARY STENT INTERVENTION N/A 09/30/2021   Procedure: CORONARY STENT INTERVENTION;  Surgeon: Marykay Lex, MD;  Location: [facility] INVASIVE CV LAB;  Service: Cardiovascular;  Laterality: N/A;   CORONARY ULTRASOUND/IVUS N/A  09/30/2021   Procedure: Intravascular Ultrasound/IVUS;  Surgeon: Marykay Lex, MD;  Location: [facility] INVASIVE CV LAB;  Service: Cardiovascular;  Laterality: N/A;   ENDARTERECTOMY FEMORAL Left 09/11/2022   Procedure: LEFT COMMON FEMORAL ENDARTERECTOMY;  Surgeon: Cephus Shelling, MD;  Location: [facility] OR;  Service: Vascular;  Laterality: Left;   ESOPHAGOGASTRODUODENOSCOPY (EGD) WITH PROPOFOL N/A 04/29/2018   Procedure: ESOPHAGOGASTRODUODENOSCOPY (EGD) WITH PROPOFOL;  Surgeon: Kathi Der, MD;  Location: [facility] ENDOSCOPY;  Service: Gastroenterology;  Laterality: N/A;   FOOT FRACTURE SURGERY Bilateral    multiple procedures   FRACTURE SURGERY     JOINT REPLACEMENT     LEFT HEART CATH AND CORONARY ANGIOGRAPHY N/A 09/30/2021   Procedure: LEFT HEART CATH AND CORONARY ANGIOGRAPHY;  Surgeon: Marykay Lex, MD;  Location: [facility] INVASIVE CV LAB;  Service: Cardiovascular;  Laterality: N/A;   LOWER EXTREMITY ANGIOGRAPHY N/A 06/16/2020   Procedure: LOWER EXTREMITY ANGIOGRAPHY;  Surgeon: Cephus Shelling, MD;  Location: [facility] INVASIVE CV LAB;  Service: Cardiovascular;  Laterality: N/A;   MAXIMUM ACCESS (MAS)POSTERIOR LUMBAR INTERBODY FUSION (PLIF) 1 LEVEL N/A 09/30/2014   Procedure: FOR MAXIMUM ACCESS (MAS) POSTERIOR LUMBAR INTERBODY FUSION (PLIF) 1 LEVEL;  Surgeon: Tia Alert, MD;  Location: [facility] NEURO ORS;  Service: Neurosurgery;  Laterality: N/A;  FOR MAXIMUM ACCESS (MAS) POSTERIOR LUMBAR INTERBODY FUSION (PLIF) 1 LEVEL LUMBAR 4-5   MULTIPLE TOOTH EXTRACTIONS Right 04/2017   NM MYOVIEW LTD  06/2017    Normal EF, 66%. No ST changes. Normal study. LOW RISK.   PATCH ANGIOPLASTY Left 09/11/2022   Procedure: LEFT FEMORAL PATCH ANGIOPLASTY USING XENOSURE BIOLOGIC PATCH 1CMX14CM;  Surgeon: Cephus Shelling, MD;  Location: [facility] OR;  Service: Vascular;  Laterality: Left;   PERIPHERAL VASCULAR ATHERECTOMY Left 06/16/2020   Procedure: PERIPHERAL VASCULAR ATHERECTOMY;  Surgeon: Cephus Shelling, MD;   Location: [facility] INVASIVE CV LAB;  Service: Cardiovascular;  Laterality: Left;  SFA   PERIPHERAL VASCULAR ATHERECTOMY Left 12/07/2022   Procedure: PERIPHERAL VASCULAR ATHERECTOMY;  Surgeon: Cephus Shelling, MD;  Location: [facility] INVASIVE CV LAB;  Service: Cardiovascular;  Laterality: Left;   PERIPHERAL VASCULAR INTERVENTION Right 08/31/2022   Procedure: PERIPHERAL VASCULAR INTERVENTION;  Surgeon: Cephus Shelling, MD;  Location: [facility] INVASIVE CV LAB;  Service: Cardiovascular;  Laterality: Right;   PERIPHERAL VASCULAR INTERVENTION  12/07/2022   Procedure: PERIPHERAL VASCULAR INTERVENTION;  Surgeon: Cephus Shelling, MD;  Location: [facility] INVASIVE CV LAB;  Service: Cardiovascular;;   PERIPHERAL VASCULAR INTERVENTION Right 10/25/2023   Procedure: PERIPHERAL VASCULAR INTERVENTION;  Surgeon: Cephus Shelling, MD;  Location: [facility] INVASIVE CV LAB;  Service: Cardiovascular;  Laterality: Right;  right external iliac   TOTAL HIP ARTHROPLASTY Right 01/16/2020   Procedure: RIGHT TOTAL HIP ARTHROPLASTY ANTERIOR APPROACH;  Surgeon: Kathryne Hitch, MD;  Location: [facility] ORS;  Service: Orthopedics;  Laterality: Right;   TOTAL HIP ARTHROPLASTY Left 04/30/2020   Procedure: LEFT TOTAL HIP ARTHROPLASTY ANTERIOR APPROACH;  Surgeon: Kathryne Hitch, MD;  Location: [facility] ORS;  Service: Orthopedics;  Laterality: Left;   TRANSTHORACIC ECHOCARDIOGRAM  09/07/2013   Done to evaluate TIA: Normal LV size and  function. EF 55-60%. GR 1 DD. Mild left atrial dilation. Mildly calcified mitral leaflets. Otherwise normal.    Family History  Problem Relation Age of Onset   Stroke Father    Heart attack Mother    Stroke Mother     SOCIAL HISTORY: Social History   Tobacco Use   Smoking status: Some Days    Current packs/day: 0.00    Types: Cigarettes    Passive exposure: Never   Smokeless tobacco: Never   Tobacco comments:    [name] says she smokes one cigarette every couple of weeks. [name] says she has a lot of  friends who still smoke  Substance Use Topics   Alcohol use: Yes    Alcohol/week: 2.0 standard drinks of alcohol    Types: 2 Glasses of wine per week    Comment: wine    Allergies  Allergen Reactions   Penicillins Swelling and Other (See Comments)    TONGUE SWELLING    Nucynta Er [Tapentadol Hcl Er] Other (See Comments)    Dry mouth   Statins     muscle pain   Lyrica [Pregabalin] Other (See Comments)    DRY MOUTH    Current Outpatient Medications  Medication Sig Dispense Refill   albuterol (VENTOLIN HFA) 108 (90 Base) MCG/ACT inhaler Inhale 2 puffs into the lungs every 6 (six) hours as needed for shortness of breath.     amLODipine (NORVASC) 10 MG tablet Take 10 mg by mouth in the morning.     ARIPiprazole (ABILIFY) 2 MG tablet Take 2 mg by mouth every morning.     aspirin EC 81 MG tablet Take 81 mg by mouth in the morning.     bisoprolol (ZEBETA) 5 MG tablet Take 1 tablet (5 mg total) by mouth daily. 90 tablet 1   Brimonidine Tartrate (LUMIFY) 0.025 % SOLN Place 1 drop into both eyes daily as needed (redness).     Budeson-Glycopyrrol-Formoterol (BREZTRI AEROSPHERE) 160-9-4.8 MCG/ACT AERO Inhale 1 puff into the lungs daily as needed (shortness of breath).     buPROPion (WELLBUTRIN XL) 150 MG 24 hr tablet Take 150 mg by mouth every morning.     clopidogrel (PLAVIX) 75 MG tablet TAKE ONE TABLET BY MOUTH DAILY (Patient taking differently: Take 75 mg by mouth 3 (three) times a week.) 90 tablet 0   desvenlafaxine (PRISTIQ) 100 MG 24 hr tablet Take 100 mg by mouth in the morning.     diazepam (VALIUM) 10 MG tablet Take 10 mg by mouth at bedtime as needed for sleep or anxiety.     Evolocumab (REPATHA SURECLICK) 140 MG/ML SOAJ INJECT CONTENTS OF 1 PEN SUBCUTANEOUSLY EVERY 14 DAYS. 6 mL 0   Ferrous Sulfate (IRON HIGH-POTENCY) 45 MG TBCR Take 45 mg by mouth daily.     lisinopril (ZESTRIL) 10 MG tablet Take 10 mg by mouth in the morning.     Multiple Vitamins-Minerals (CENTRUM SILVER  50+WOMEN) TABS Take 1 tablet by mouth daily.     naphazoline-pheniramine (VISINE) 0.025-0.3 % ophthalmic solution Place 1 drop into both eyes 4 (four) times daily as needed (dry/irritated eyes.).     oxyCODONE-acetaminophen (PERCOCET) 10-325 MG tablet Take 1 tablet by mouth every 6 (six) hours as needed for pain.     pantoprazole (PROTONIX) 40 MG tablet Take 40 mg by mouth 2 (two) times daily as needed (acid reflux).     prazosin (MINIPRESS) 1 MG capsule Take 1 mg by mouth at bedtime.     Propylhexedrine (BENZEDREX NA)  Place 1 spray into the nose 2 (two) times daily as needed (nasal congestion.).     traZODone (DESYREL) 50 MG tablet Take 50 mg by mouth at bedtime.     No current facility-administered medications for this visit.    REVIEW OF SYSTEMS:  [X]  denotes positive finding, [ ]  denotes negative finding Cardiac  Comments:  Chest pain or chest pressure:    Shortness of breath upon exertion:    Short of breath when lying flat:    Irregular heart rhythm:        Vascular    Pain in calf, thigh, or hip brought on by ambulation:    Pain in feet at night that wakes you up from your sleep:     Blood clot in your veins:    Leg swelling:         Pulmonary    Oxygen at home:    Productive cough:     Wheezing:         Neurologic    Sudden weakness in arms or legs:     Sudden numbness in arms or legs:     Sudden onset of difficulty speaking or slurred speech:    Temporary loss of vision in one eye:     Problems with dizziness:         Gastrointestinal    Blood in stool:     Vomited blood:         Genitourinary    Burning when urinating:     Blood in urine:        Psychiatric    Major depression:         Hematologic    Bleeding problems:    Problems with blood clotting too easily:        Skin    Rashes or ulcers:        Constitutional    Fever or chills:      PHYSICAL EXAM: There were no vitals filed for this visit.   GENERAL: The patient is a well-nourished  female, in no acute distress. The vital signs are documented above. CARDIAC: There is a regular rate and rhythm.  VASCULAR:  Left groin incision  Brisk DP/PT signals BLE  DATA:   N/A  Assessment/Plan:  72 y.o. female with multiple comorbidities that presents for hospital follow-up.  Most recently on 10/25/2023 she underwent right lower extremity angiogram with stenting of her external iliac artery for an 80% stenosis for a threatened bypass.  She previously had a right above to below-knee popliteal bypass in 2018 with Dr. Imogene Burn.   Cephus Shelling, MD [facility] Office: [phone]

## 2023-12-11 ENCOUNTER — Encounter: Payer: Medicare Other | Admitting: Vascular Surgery

## 2023-12-11 DIAGNOSIS — M542 Cervicalgia: Secondary | ICD-10-CM | POA: Diagnosis not present

## 2023-12-11 DIAGNOSIS — Z79891 Long term (current) use of opiate analgesic: Secondary | ICD-10-CM | POA: Diagnosis not present

## 2023-12-11 DIAGNOSIS — G894 Chronic pain syndrome: Secondary | ICD-10-CM | POA: Diagnosis not present

## 2023-12-11 DIAGNOSIS — M5116 Intervertebral disc disorders with radiculopathy, lumbar region: Secondary | ICD-10-CM | POA: Diagnosis not present

## 2023-12-11 DIAGNOSIS — M545 Low back pain, unspecified: Secondary | ICD-10-CM | POA: Diagnosis not present

## 2023-12-11 DIAGNOSIS — M47812 Spondylosis without myelopathy or radiculopathy, cervical region: Secondary | ICD-10-CM | POA: Diagnosis not present

## 2023-12-17 ENCOUNTER — Other Ambulatory Visit: Payer: Self-pay | Admitting: Vascular Surgery

## 2024-01-09 DIAGNOSIS — G894 Chronic pain syndrome: Secondary | ICD-10-CM | POA: Diagnosis not present

## 2024-01-09 DIAGNOSIS — M25551 Pain in right hip: Secondary | ICD-10-CM | POA: Diagnosis not present

## 2024-01-09 DIAGNOSIS — Z79891 Long term (current) use of opiate analgesic: Secondary | ICD-10-CM | POA: Diagnosis not present

## 2024-01-09 DIAGNOSIS — M545 Low back pain, unspecified: Secondary | ICD-10-CM | POA: Diagnosis not present

## 2024-01-09 DIAGNOSIS — M5116 Intervertebral disc disorders with radiculopathy, lumbar region: Secondary | ICD-10-CM | POA: Diagnosis not present

## 2024-01-14 ENCOUNTER — Telehealth: Payer: Self-pay

## 2024-01-14 NOTE — Telephone Encounter (Signed)
Auth Submission: NO AUTH NEEDED Site of care: Site of care: [facility] Payer: MEDICARE A/B & SUPP Medication & CPT/J Code(s) submitted: Prolia (Denosumab) [id number] Route of submission (phone, fax, portal):  Phone # Fax # Auth type: Buy/Bill PB Units/visits requested: 2 Reference number:  Approval from: 12/25/22 to 01/24/25

## 2024-01-17 DIAGNOSIS — G894 Chronic pain syndrome: Secondary | ICD-10-CM | POA: Diagnosis not present

## 2024-01-17 DIAGNOSIS — I739 Peripheral vascular disease, unspecified: Secondary | ICD-10-CM | POA: Diagnosis not present

## 2024-01-17 DIAGNOSIS — W19XXXD Unspecified fall, subsequent encounter: Secondary | ICD-10-CM | POA: Diagnosis not present

## 2024-01-17 DIAGNOSIS — M79604 Pain in right leg: Secondary | ICD-10-CM | POA: Diagnosis not present

## 2024-01-17 DIAGNOSIS — Z87891 Personal history of nicotine dependence: Secondary | ICD-10-CM | POA: Diagnosis not present

## 2024-01-17 DIAGNOSIS — T148XXA Other injury of unspecified body region, initial encounter: Secondary | ICD-10-CM | POA: Diagnosis not present

## 2024-01-21 NOTE — Progress Notes (Deleted)
Patient name: Brenda Carney MRN: [id number] DOB: [birth year] Sex: female  REASON FOR VISIT: Follow-up for surveillance  HPI: Brenda Carney is a 73 y.o. female that presents for follow-up and ongoing surveillance of her right leg PAD.  Previously patient previously had a right above-knee to below-knee popliteal bypass in 2018 by Dr. Imogene Burn.  She is undergone additional right SFA stenting to maintain patency of her bypass.  Most recently on 10/25/2023 had stenting of her right external iliac artery for a high-grade >80% stenosis with 7 x 40 Eluvia.  Past Medical History:  Diagnosis Date   Arthritis    "thumbs; hips" (05/14/2017)   CAD S/P 2 V PCI (pRCA, PCx-OM)    Coronary calcium scoring: June 2018: Score 1106. 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   Cervical spondylosis    Chronic lower back pain    COPD (chronic obstructive pulmonary disease) (HCC)    "little bit" (05/14/2017)   Depression    Exercise-induced asthma with acute exacerbation    "only in very hot weather" (05/14/2017)   GERD (gastroesophageal reflux disease)    History of IBS    resovlved   Hx of multiple concussions    from horse training and riding   Hyperlipidemia with target LDL less than 70    Mostly statin intolerant,. 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   Pneumonia 2020   Sleep apnea    does not wear CPAP; "think it was medication related" (05/14/2017)   Stroke [facility]) 2010   TIA   Thoracic aortic ectasia (HCC)    ~40 mm on Coronary Calcium Score CT - recommend f/u  CTA or MRA   TIA (transient ischemic attack) 09/2011   "several"    Past Surgical History:  Procedure Laterality Date   ABDOMINAL AORTOGRAM W/LOWER EXTREMITY N/A 05/14/2017   Procedure: Abdominal Aortogram w/Lower Extremity;  Surgeon: Fransisco Hertz, MD;  Location: [facility] INVASIVE CV LAB;  Service: Cardiovascular;  Laterality: N/A;   ABDOMINAL AORTOGRAM W/LOWER EXTREMITY N/A 08/31/2022   Procedure: ABDOMINAL AORTOGRAM W/LOWER EXTREMITY;  Surgeon: Cephus Shelling, MD;  Location: [facility] INVASIVE CV LAB;  Service: Cardiovascular;  Laterality: N/A;   ABDOMINAL AORTOGRAM W/LOWER EXTREMITY N/A 12/07/2022   Procedure: ABDOMINAL AORTOGRAM W/LOWER EXTREMITY;  Surgeon: Cephus Shelling, MD;  Location: [facility] INVASIVE CV LAB;  Service: Cardiovascular;  Laterality: N/A;   ABDOMINAL AORTOGRAM W/LOWER EXTREMITY Right 10/25/2023   Procedure: ABDOMINAL AORTOGRAM W/LOWER EXTREMITY;  Surgeon: Cephus Shelling, MD;  Location: [facility] INVASIVE CV LAB;  Service: Cardiovascular;  Laterality: Right;   ACROMIO-CLAVICULAR JOINT REPAIR Left 1990s   "I think it was called an [facility] joint removal"   BACK SURGERY     BYPASS GRAFT POPLITEAL TO POPLITEAL Right 05/15/2017   Procedure: BYPASS GRAFT ABOVE KNEE POPLITEAL TO BELOW KNEE POPLITEAL ARTERY;  Surgeon: Fransisco Hertz, MD;  Location: [facility] OR;  Service: Vascular;  Laterality: Right;   CARDIAC CATHETERIZATION     CORONARY STENT INTERVENTION N/A 09/30/2021   Procedure: CORONARY STENT INTERVENTION;  Surgeon: Marykay Lex, MD;  Location: [facility] INVASIVE CV LAB;  Service: Cardiovascular;  Laterality: N/A;   CORONARY ULTRASOUND/IVUS N/A 09/30/2021   Procedure: Intravascular Ultrasound/IVUS;  Surgeon: Marykay Lex, MD;  Location: [facility] INVASIVE CV LAB;  Service: Cardiovascular;  Laterality: N/A;   ENDARTERECTOMY FEMORAL Left 09/11/2022   Procedure: LEFT COMMON FEMORAL ENDARTERECTOMY;  Surgeon: Cephus Shelling, MD;  Location: [facility] OR;  Service: Vascular;  Laterality: Left;    ESOPHAGOGASTRODUODENOSCOPY (EGD) WITH PROPOFOL N/A 04/29/2018   Procedure: ESOPHAGOGASTRODUODENOSCOPY (EGD) WITH PROPOFOL;  Surgeon: Kathi Der, MD;  Location: [facility] ENDOSCOPY;  Service: Gastroenterology;  Laterality: N/A;   FOOT FRACTURE SURGERY Bilateral    multiple procedures   FRACTURE SURGERY     JOINT REPLACEMENT     LEFT HEART CATH AND CORONARY ANGIOGRAPHY N/A 09/30/2021   Procedure: LEFT HEART CATH AND CORONARY ANGIOGRAPHY;  Surgeon: Marykay Lex, MD;  Location: [facility] INVASIVE CV LAB;  Service: Cardiovascular;  Laterality: N/A;   LOWER EXTREMITY ANGIOGRAPHY N/A 06/16/2020   Procedure: LOWER EXTREMITY ANGIOGRAPHY;  Surgeon: Cephus Shelling, MD;  Location: [facility] INVASIVE CV LAB;  Service: Cardiovascular;  Laterality: N/A;   MAXIMUM ACCESS (MAS)POSTERIOR LUMBAR INTERBODY FUSION (PLIF) 1 LEVEL N/A 09/30/2014   Procedure: FOR MAXIMUM ACCESS (MAS) POSTERIOR LUMBAR INTERBODY FUSION (PLIF) 1 LEVEL;  Surgeon: Tia Alert, MD;  Location: [facility] NEURO ORS;  Service: Neurosurgery;  Laterality: N/A;  FOR MAXIMUM ACCESS (MAS) POSTERIOR LUMBAR INTERBODY FUSION (PLIF) 1 LEVEL LUMBAR 4-5   MULTIPLE TOOTH EXTRACTIONS Right 04/2017   NM MYOVIEW LTD  06/2017    Normal EF, 66%. No ST changes. Normal study. LOW RISK.   PATCH ANGIOPLASTY Left 09/11/2022   Procedure: LEFT FEMORAL PATCH ANGIOPLASTY USING XENOSURE BIOLOGIC PATCH 1CMX14CM;  Surgeon: Cephus Shelling, MD;  Location: [facility] OR;  Service: Vascular;  Laterality: Left;   PERIPHERAL VASCULAR ATHERECTOMY Left 06/16/2020   Procedure: PERIPHERAL VASCULAR ATHERECTOMY;  Surgeon: Cephus Shelling, MD;  Location: [facility] INVASIVE CV LAB;  Service: Cardiovascular;  Laterality: Left;  SFA   PERIPHERAL VASCULAR ATHERECTOMY Left 12/07/2022   Procedure: PERIPHERAL VASCULAR ATHERECTOMY;  Surgeon: Cephus Shelling, MD;  Location: [facility] INVASIVE CV LAB;  Service: Cardiovascular;  Laterality: Left;   PERIPHERAL VASCULAR INTERVENTION Right 08/31/2022   Procedure:  PERIPHERAL VASCULAR INTERVENTION;  Surgeon: Cephus Shelling, MD;  Location: [facility] INVASIVE CV LAB;  Service: Cardiovascular;  Laterality: Right;   PERIPHERAL VASCULAR INTERVENTION  12/07/2022   Procedure: PERIPHERAL VASCULAR INTERVENTION;  Surgeon: Cephus Shelling, MD;  Location: [facility] INVASIVE CV LAB;  Service: Cardiovascular;;   PERIPHERAL VASCULAR INTERVENTION Right 10/25/2023   Procedure: PERIPHERAL VASCULAR INTERVENTION;  Surgeon: Cephus Shelling, MD;  Location: [facility] INVASIVE CV LAB;  Service: Cardiovascular;  Laterality: Right;  right external iliac   TOTAL HIP ARTHROPLASTY Right 01/16/2020   Procedure: RIGHT TOTAL HIP ARTHROPLASTY ANTERIOR APPROACH;  Surgeon: Kathryne Hitch, MD;  Location: [facility] ORS;  Service: Orthopedics;  Laterality: Right;   TOTAL HIP ARTHROPLASTY Left 04/30/2020   Procedure: LEFT TOTAL HIP ARTHROPLASTY ANTERIOR APPROACH;  Surgeon: Kathryne Hitch, MD;  Location: [facility] ORS;  Service: Orthopedics;  Laterality: Left;   TRANSTHORACIC ECHOCARDIOGRAM  09/07/2013   Done to evaluate TIA: Normal LV size and function. EF 55-60%. GR 1 DD. Mild left atrial dilation. Mildly calcified mitral leaflets. Otherwise normal.    Family History  Problem Relation Age of Onset   Stroke Father    Heart attack Mother    Stroke Mother     SOCIAL HISTORY: Social History  Tobacco Use   Smoking status: Some Days    Current packs/day: 0.00    Types: Cigarettes    Passive exposure: Never   Smokeless tobacco: Never   Tobacco comments:    [name] says she smokes one cigarette every couple of weeks. [name] says she has a lot of friends who still smoke  Substance Use Topics   Alcohol use: Yes    Alcohol/week: 2.0 standard drinks of alcohol    Types: 2 Glasses of wine per week    Comment: wine    Allergies  Allergen Reactions   Penicillins Swelling and Other (See Comments)    TONGUE SWELLING    Nucynta Er [Tapentadol Hcl Er] Other (See Comments)    Dry mouth   Statins      muscle pain   Lyrica [Pregabalin] Other (See Comments)    DRY MOUTH    Current Outpatient Medications  Medication Sig Dispense Refill   albuterol (VENTOLIN HFA) 108 (90 Base) MCG/ACT inhaler Inhale 2 puffs into the lungs every 6 (six) hours as needed for shortness of breath.     amLODipine (NORVASC) 10 MG tablet Take 10 mg by mouth in the morning.     ARIPiprazole (ABILIFY) 2 MG tablet Take 2 mg by mouth every morning.     aspirin EC 81 MG tablet Take 81 mg by mouth in the morning.     bisoprolol (ZEBETA) 5 MG tablet Take 1 tablet (5 mg total) by mouth daily. 90 tablet 1   Brimonidine Tartrate (LUMIFY) 0.025 % SOLN Place 1 drop into both eyes daily as needed (redness).     Budeson-Glycopyrrol-Formoterol (BREZTRI AEROSPHERE) 160-9-4.8 MCG/ACT AERO Inhale 1 puff into the lungs daily as needed (shortness of breath).     buPROPion (WELLBUTRIN XL) 150 MG 24 hr tablet Take 150 mg by mouth every morning.     clopidogrel (PLAVIX) 75 MG tablet TAKE ONE TABLET BY MOUTH ONCE DAILY 90 tablet 3   desvenlafaxine (PRISTIQ) 100 MG 24 hr tablet Take 100 mg by mouth in the morning.     diazepam (VALIUM) 10 MG tablet Take 10 mg by mouth at bedtime as needed for sleep or anxiety.     Evolocumab (REPATHA SURECLICK) 140 MG/ML SOAJ INJECT CONTENTS OF 1 PEN SUBCUTANEOUSLY EVERY 14 DAYS. 6 mL 0   Ferrous Sulfate (IRON HIGH-POTENCY) 45 MG TBCR Take 45 mg by mouth daily.     lisinopril (ZESTRIL) 10 MG tablet Take 10 mg by mouth in the morning.     Multiple Vitamins-Minerals (CENTRUM SILVER 50+WOMEN) TABS Take 1 tablet by mouth daily.     naphazoline-pheniramine (VISINE) 0.025-0.3 % ophthalmic solution Place 1 drop into both eyes 4 (four) times daily as needed (dry/irritated eyes.).     oxyCODONE-acetaminophen (PERCOCET) 10-325 MG tablet Take 1 tablet by mouth every 6 (six) hours as needed for pain.     pantoprazole (PROTONIX) 40 MG tablet Take 40 mg by mouth 2 (two) times daily as needed (acid reflux).      prazosin (MINIPRESS) 1 MG capsule Take 1 mg by mouth at bedtime.     Propylhexedrine (BENZEDREX NA) Place 1 spray into the nose 2 (two) times daily as needed (nasal congestion.).     traZODone (DESYREL) 50 MG tablet Take 50 mg by mouth at bedtime.     No current facility-administered medications for this visit.    REVIEW OF SYSTEMS:  [X]  denotes positive finding, [ ]  denotes negative finding Cardiac  Comments:  Chest pain or  chest pressure: ***   Shortness of breath upon exertion:    Short of breath when lying flat:    Irregular heart rhythm:        Vascular    Pain in calf, thigh, or hip brought on by ambulation:    Pain in feet at night that wakes you up from your sleep:     Blood clot in your veins:    Leg swelling:         Pulmonary    Oxygen at home:    Productive cough:     Wheezing:         Neurologic    Sudden weakness in arms or legs:     Sudden numbness in arms or legs:     Sudden onset of difficulty speaking or slurred speech:    Temporary loss of vision in one eye:     Problems with dizziness:         Gastrointestinal    Blood in stool:     Vomited blood:         Genitourinary    Burning when urinating:     Blood in urine:        Psychiatric    Major depression:         Hematologic    Bleeding problems:    Problems with blood clotting too easily:        Skin    Rashes or ulcers:        Constitutional    Fever or chills:      PHYSICAL EXAM: There were no vitals filed for this visit.  GENERAL: The patient is a well-nourished female, in no acute distress. The vital signs are documented above. CARDIAC: There is a regular rate and rhythm.  VASCULAR: *** PULMONARY: There is good air exchange bilaterally without wheezing or rales. ABDOMEN: Soft and non-tender with normal pitched bowel sounds.  MUSCULOSKELETAL: There are no major deformities or cyanosis. NEUROLOGIC: No focal weakness or paresthesias are detected. SKIN: There are no ulcers or rashes  noted. PSYCHIATRIC: The patient has a normal affect.  DATA:   Aortoiliac duplex 12/07/2023 with patent right external iliac stent  ABIs 0.97 on the right triphasic and 0.94 on the left triphasic  Assessment/Plan:  73 y.o. female that presents for follow-up and ongoing surveillance of her right leg PAD.  Previously patient previously had a right above-knee to below-knee popliteal bypass in 2018 by Dr. Imogene Burn.  She is undergone additional right SFA stenting to maintain patency of her bypass.  Most recently on 10/25/2023 had stenting of her right external iliac artery for a high-grade >80% stenosis with 7 x 40 Eluvia.  Discussed that her external iliac stent remains patent on duplex.  She has normal ABIs.   Cephus Shelling, MD [facility] Office: [phone]

## 2024-01-22 ENCOUNTER — Encounter: Payer: Medicare Other | Admitting: Vascular Surgery

## 2024-01-23 DIAGNOSIS — L570 Actinic keratosis: Secondary | ICD-10-CM | POA: Diagnosis not present

## 2024-01-23 DIAGNOSIS — X32XXXD Exposure to sunlight, subsequent encounter: Secondary | ICD-10-CM | POA: Diagnosis not present

## 2024-02-06 DIAGNOSIS — M545 Low back pain, unspecified: Secondary | ICD-10-CM | POA: Diagnosis not present

## 2024-02-06 DIAGNOSIS — G894 Chronic pain syndrome: Secondary | ICD-10-CM | POA: Diagnosis not present

## 2024-02-06 DIAGNOSIS — Z79891 Long term (current) use of opiate analgesic: Secondary | ICD-10-CM | POA: Diagnosis not present

## 2024-02-06 DIAGNOSIS — M5116 Intervertebral disc disorders with radiculopathy, lumbar region: Secondary | ICD-10-CM | POA: Diagnosis not present

## 2024-02-06 DIAGNOSIS — M25551 Pain in right hip: Secondary | ICD-10-CM | POA: Diagnosis not present

## 2024-02-14 ENCOUNTER — Other Ambulatory Visit: Payer: Self-pay | Admitting: Cardiology

## 2024-03-07 ENCOUNTER — Other Ambulatory Visit: Payer: Self-pay

## 2024-03-07 ENCOUNTER — Observation Stay (HOSPITAL_COMMUNITY)
Admission: EM | Admit: 2024-03-07 | Discharge: 2024-03-08 | Disposition: A | Discharge status: Home / Self Care | Attending: Emergency Medicine | Admitting: Emergency Medicine

## 2024-03-07 ENCOUNTER — Encounter (HOSPITAL_COMMUNITY): Payer: Self-pay

## 2024-03-07 ENCOUNTER — Emergency Department (HOSPITAL_COMMUNITY)

## 2024-03-07 DIAGNOSIS — Z79899 Other long term (current) drug therapy: Secondary | ICD-10-CM | POA: Insufficient documentation

## 2024-03-07 DIAGNOSIS — J441 Chronic obstructive pulmonary disease with (acute) exacerbation: Principal | ICD-10-CM | POA: Diagnosis present

## 2024-03-07 DIAGNOSIS — I2510 Atherosclerotic heart disease of native coronary artery without angina pectoris: Secondary | ICD-10-CM

## 2024-03-07 DIAGNOSIS — R918 Other nonspecific abnormal finding of lung field: Secondary | ICD-10-CM | POA: Diagnosis not present

## 2024-03-07 DIAGNOSIS — Z96643 Presence of artificial hip joint, bilateral: Secondary | ICD-10-CM | POA: Diagnosis not present

## 2024-03-07 DIAGNOSIS — J9601 Acute respiratory failure with hypoxia: Secondary | ICD-10-CM | POA: Diagnosis not present

## 2024-03-07 DIAGNOSIS — G894 Chronic pain syndrome: Secondary | ICD-10-CM | POA: Diagnosis present

## 2024-03-07 DIAGNOSIS — R0902 Hypoxemia: Secondary | ICD-10-CM | POA: Diagnosis not present

## 2024-03-07 DIAGNOSIS — Z8673 Personal history of transient ischemic attack (TIA), and cerebral infarction without residual deficits: Secondary | ICD-10-CM | POA: Diagnosis not present

## 2024-03-07 DIAGNOSIS — R062 Wheezing: Secondary | ICD-10-CM | POA: Diagnosis not present

## 2024-03-07 DIAGNOSIS — R739 Hyperglycemia, unspecified: Secondary | ICD-10-CM | POA: Insufficient documentation

## 2024-03-07 DIAGNOSIS — I10 Essential (primary) hypertension: Secondary | ICD-10-CM | POA: Diagnosis present

## 2024-03-07 DIAGNOSIS — I739 Peripheral vascular disease, unspecified: Secondary | ICD-10-CM | POA: Diagnosis present

## 2024-03-07 DIAGNOSIS — Z7902 Long term (current) use of antithrombotics/antiplatelets: Secondary | ICD-10-CM | POA: Diagnosis not present

## 2024-03-07 DIAGNOSIS — F17210 Nicotine dependence, cigarettes, uncomplicated: Secondary | ICD-10-CM | POA: Diagnosis not present

## 2024-03-07 DIAGNOSIS — G8929 Other chronic pain: Secondary | ICD-10-CM | POA: Insufficient documentation

## 2024-03-07 DIAGNOSIS — J101 Influenza due to other identified influenza virus with other respiratory manifestations: Secondary | ICD-10-CM

## 2024-03-07 DIAGNOSIS — Z7982 Long term (current) use of aspirin: Secondary | ICD-10-CM | POA: Diagnosis not present

## 2024-03-07 DIAGNOSIS — Z9861 Coronary angioplasty status: Secondary | ICD-10-CM | POA: Diagnosis not present

## 2024-03-07 DIAGNOSIS — F32A Depression, unspecified: Secondary | ICD-10-CM | POA: Insufficient documentation

## 2024-03-07 DIAGNOSIS — Z1152 Encounter for screening for COVID-19: Secondary | ICD-10-CM | POA: Diagnosis not present

## 2024-03-07 DIAGNOSIS — J9621 Acute and chronic respiratory failure with hypoxia: Secondary | ICD-10-CM | POA: Diagnosis present

## 2024-03-07 DIAGNOSIS — R059 Cough, unspecified: Secondary | ICD-10-CM | POA: Diagnosis not present

## 2024-03-07 DIAGNOSIS — R0602 Shortness of breath: Secondary | ICD-10-CM | POA: Diagnosis not present

## 2024-03-07 LAB — I-STAT CHEM 8, ED
BUN: 13 mg/dL (ref 8–23)
Calcium, Ion: 1.09 mmol/L — ABNORMAL LOW (ref 1.15–1.40)
Chloride: 97 mmol/L — ABNORMAL LOW (ref 98–111)
Creatinine, Ser: 0.6 mg/dL (ref 0.44–1.00)
Glucose, Bld: 123 mg/dL — ABNORMAL HIGH (ref 70–99)
HCT: 40 % (ref 36.0–46.0)
Hemoglobin: 13.6 g/dL (ref 12.0–15.0)
Potassium: 3.5 mmol/L (ref 3.5–5.1)
Sodium: 131 mmol/L — ABNORMAL LOW (ref 135–145)
TCO2: 23 mmol/L (ref 22–32)

## 2024-03-07 LAB — COMPREHENSIVE METABOLIC PANEL
ALT: 26 U/L (ref 0–44)
AST: 46 U/L — ABNORMAL HIGH (ref 15–41)
Albumin: 3.2 g/dL — ABNORMAL LOW (ref 3.5–5.0)
Alkaline Phosphatase: 55 U/L (ref 38–126)
Anion gap: 13 (ref 5–15)
BUN: 13 mg/dL (ref 8–23)
CO2: 23 mmol/L (ref 22–32)
Calcium: 8.8 mg/dL — ABNORMAL LOW (ref 8.9–10.3)
Chloride: 97 mmol/L — ABNORMAL LOW (ref 98–111)
Creatinine, Ser: 0.75 mg/dL (ref 0.44–1.00)
GFR, Estimated: >60 mL/min (ref >60)
Glucose, Bld: 127 mg/dL — ABNORMAL HIGH (ref 70–99)
Potassium: 3.5 mmol/L (ref 3.5–5.1)
Sodium: 133 mmol/L — ABNORMAL LOW (ref 135–145)
Total Bilirubin: 0.5 mg/dL (ref 0.0–1.2)
Total Protein: 6.2 g/dL — ABNORMAL LOW (ref 6.5–8.1)

## 2024-03-07 LAB — I-STAT VENOUS BLOOD GAS, ED
Acid-base deficit: 1 mmol/L (ref 0.0–2.0)
Bicarbonate: 24 mmol/L (ref 20.0–28.0)
Calcium, Ion: 1.11 mmol/L — ABNORMAL LOW (ref 1.15–1.40)
HCT: 40 % (ref 36.0–46.0)
Hemoglobin: 13.6 g/dL (ref 12.0–15.0)
O2 Saturation: 69 %
Potassium: 3.5 mmol/L (ref 3.5–5.1)
Sodium: 132 mmol/L — ABNORMAL LOW (ref 135–145)
TCO2: 25 mmol/L (ref 22–32)
pCO2, Ven: 39.7 mmHg — ABNORMAL LOW (ref 44–60)
pH, Ven: 7.39 (ref 7.25–7.43)
pO2, Ven: 36 mmHg (ref 32–45)

## 2024-03-07 LAB — CBC
HCT: 37.7 % (ref 36.0–46.0)
Hemoglobin: 12.9 g/dL (ref 12.0–15.0)
MCH: 33.8 pg (ref 26.0–34.0)
MCHC: 34.2 g/dL (ref 30.0–36.0)
MCV: 98.7 fL (ref 80.0–100.0)
Platelets: 281 10*3/uL (ref 150–400)
RBC: 3.82 MIL/uL — ABNORMAL LOW (ref 3.87–5.11)
RDW: 12.4 % (ref 11.5–15.5)
WBC: 11 10*3/uL — ABNORMAL HIGH (ref 4.0–10.5)
nRBC: 0 % (ref 0.0–0.2)

## 2024-03-07 LAB — RESP PANEL BY RT-PCR (RSV, FLU A&B, COVID)  RVPGX2
Influenza A by PCR: POSITIVE — AB
Influenza B by PCR: NEGATIVE
Resp Syncytial Virus by PCR: NEGATIVE
SARS Coronavirus 2 by RT PCR: NEGATIVE

## 2024-03-07 LAB — D-DIMER, QUANTITATIVE: D-Dimer, Quant: 1.09 ug{FEU}/mL — ABNORMAL HIGH (ref 0.00–0.50)

## 2024-03-07 MED ORDER — LISINOPRIL 10 MG PO TABS
10.0000 mg | ORAL_TABLET | Freq: Every morning | ORAL | Status: DC
  Administered 2024-03-08: 10 mg via ORAL
  Filled 2024-03-07: qty 1

## 2024-03-07 MED ORDER — ACETAMINOPHEN 650 MG RE SUPP: 650.0000 mg | Freq: Four times a day (QID) | RECTAL | Status: DC | PRN

## 2024-03-07 MED ORDER — GUAIFENESIN ER 600 MG PO TB12
600.0000 mg | ORAL_TABLET | Freq: Two times a day (BID) | ORAL | Status: DC
  Administered 2024-03-07 – 2024-03-08 (×2): 600 mg via ORAL
  Filled 2024-03-07 (×2): qty 1

## 2024-03-07 MED ORDER — CLOPIDOGREL BISULFATE 75 MG PO TABS
75.0000 mg | ORAL_TABLET | Freq: Every day | ORAL | Status: DC
  Administered 2024-03-08: 75 mg via ORAL
  Filled 2024-03-07: qty 1

## 2024-03-07 MED ORDER — ACETAMINOPHEN 325 MG PO TABS: 650.0000 mg | ORAL_TABLET | Freq: Four times a day (QID) | ORAL | Status: DC | PRN

## 2024-03-07 MED ORDER — ONDANSETRON HCL 4 MG PO TABS: 4.0000 mg | ORAL_TABLET | Freq: Four times a day (QID) | ORAL | Status: DC | PRN

## 2024-03-07 MED ORDER — VENLAFAXINE HCL ER 150 MG PO CP24
150.0000 mg | ORAL_CAPSULE | Freq: Every day | ORAL | Status: DC
  Administered 2024-03-08: 150 mg via ORAL
  Filled 2024-03-07: qty 1

## 2024-03-07 MED ORDER — SODIUM CHLORIDE 0.9 % IV BOLUS
1000.0000 mL | Freq: Once | INTRAVENOUS | Status: AC
  Administered 2024-03-07: 1000 mL via INTRAVENOUS

## 2024-03-07 MED ORDER — OSELTAMIVIR PHOSPHATE 75 MG PO CAPS
75.0000 mg | ORAL_CAPSULE | Freq: Once | ORAL | Status: AC
  Administered 2024-03-08: 75 mg via ORAL
  Filled 2024-03-07 (×2): qty 1

## 2024-03-07 MED ORDER — ENOXAPARIN SODIUM 40 MG/0.4ML IJ SOSY
40.0000 mg | PREFILLED_SYRINGE | INTRAMUSCULAR | Status: DC
  Administered 2024-03-07: 40 mg via SUBCUTANEOUS
  Filled 2024-03-07: qty 0.4

## 2024-03-07 MED ORDER — OSELTAMIVIR PHOSPHATE 75 MG PO CAPS: 75.0000 mg | ORAL_CAPSULE | Freq: Two times a day (BID) | ORAL | Status: DC

## 2024-03-07 MED ORDER — AMLODIPINE BESYLATE 10 MG PO TABS
10.0000 mg | ORAL_TABLET | Freq: Every morning | ORAL | Status: DC
  Administered 2024-03-08: 10 mg via ORAL
  Filled 2024-03-07: qty 1

## 2024-03-07 MED ORDER — OSELTAMIVIR PHOSPHATE 30 MG PO CAPS
30.0000 mg | ORAL_CAPSULE | Freq: Two times a day (BID) | ORAL | Status: DC
  Administered 2024-03-08: 30 mg via ORAL
  Filled 2024-03-07 (×2): qty 1

## 2024-03-07 MED ORDER — METHYLPREDNISOLONE SODIUM SUCC 40 MG IJ SOLR
40.0000 mg | Freq: Two times a day (BID) | INTRAMUSCULAR | Status: DC
  Administered 2024-03-07 – 2024-03-08 (×2): 40 mg via INTRAVENOUS
  Filled 2024-03-07 (×2): qty 1

## 2024-03-07 MED ORDER — OXYCODONE HCL 5 MG PO TABS: 10.0000 mg | ORAL_TABLET | Freq: Four times a day (QID) | ORAL | Status: DC | PRN

## 2024-03-07 MED ORDER — NICOTINE 21 MG/24HR TD PT24
21.0000 mg | MEDICATED_PATCH | Freq: Every day | TRANSDERMAL | Status: DC
  Administered 2024-03-07 – 2024-03-08 (×2): 21 mg via TRANSDERMAL
  Filled 2024-03-07 (×2): qty 1

## 2024-03-07 MED ORDER — IPRATROPIUM-ALBUTEROL 0.5-2.5 (3) MG/3ML IN SOLN
3.0000 mL | Freq: Once | RESPIRATORY_TRACT | Status: AC
  Administered 2024-03-07: 3 mL via RESPIRATORY_TRACT
  Filled 2024-03-07: qty 3

## 2024-03-07 MED ORDER — OXYCODONE-ACETAMINOPHEN 10-325 MG PO TABS: 1.0000 | ORAL_TABLET | Freq: Four times a day (QID) | ORAL | Status: DC | PRN

## 2024-03-07 MED ORDER — ASPIRIN 81 MG PO TBEC
81.0000 mg | DELAYED_RELEASE_TABLET | Freq: Every morning | ORAL | Status: DC
  Administered 2024-03-08: 81 mg via ORAL
  Filled 2024-03-07: qty 1

## 2024-03-07 MED ORDER — SENNOSIDES-DOCUSATE SODIUM 8.6-50 MG PO TABS: 1.0000 | ORAL_TABLET | Freq: Every evening | ORAL | Status: DC | PRN

## 2024-03-07 MED ORDER — ONDANSETRON HCL 4 MG/2ML IJ SOLN: 4.0000 mg | Freq: Four times a day (QID) | INTRAMUSCULAR | Status: DC | PRN

## 2024-03-07 MED ORDER — ARIPIPRAZOLE 2 MG PO TABS
2.0000 mg | ORAL_TABLET | Freq: Every morning | ORAL | Status: DC
Start: 2024-03-08 — End: 2024-03-08
  Administered 2024-03-08: 2 mg via ORAL
  Filled 2024-03-07: qty 1

## 2024-03-07 MED ORDER — ARFORMOTEROL TARTRATE 15 MCG/2ML IN NEBU
15.0000 ug | INHALATION_SOLUTION | Freq: Two times a day (BID) | RESPIRATORY_TRACT | Status: DC
  Administered 2024-03-07 – 2024-03-08 (×2): 15 ug via RESPIRATORY_TRACT
  Filled 2024-03-07 (×2): qty 2

## 2024-03-07 MED ORDER — IPRATROPIUM-ALBUTEROL 0.5-2.5 (3) MG/3ML IN SOLN: 3.0000 mL | Freq: Four times a day (QID) | RESPIRATORY_TRACT | Status: DC | PRN

## 2024-03-07 MED ORDER — ACETAMINOPHEN 325 MG PO TABS: 325.0000 mg | ORAL_TABLET | Freq: Four times a day (QID) | ORAL | Status: DC | PRN

## 2024-03-07 MED ORDER — BUDESONIDE 0.25 MG/2ML IN SUSP
0.2500 mg | Freq: Two times a day (BID) | RESPIRATORY_TRACT | Status: DC
  Administered 2024-03-07 – 2024-03-08 (×2): 0.25 mg via RESPIRATORY_TRACT
  Filled 2024-03-07 (×2): qty 2

## 2024-03-07 MED ORDER — BISOPROLOL FUMARATE 5 MG PO TABS
5.0000 mg | ORAL_TABLET | Freq: Every day | ORAL | Status: DC
Start: 2024-03-08 — End: 2024-03-08
  Administered 2024-03-08: 5 mg via ORAL
  Filled 2024-03-07: qty 1

## 2024-03-07 NOTE — ED Provider Notes (Addendum)
  EMERGENCY DEPARTMENT AT [facility] Provider Note   CSN: [id number] Arrival date & time: 03/07/24  1459     History  Chief Complaint  Patient presents with   Respiratory Distress    Brenda Carney is a 73 y.o. female with medical history of COPD, chronic low back pain, GERD, hypertension, TIA.  Patient presents to ED for evaluation of shortness of breath.  Reports that over the last 3 days she has progressively worsening shortness of breath.  Denies chest pain, abdominal pain, nausea, vomiting, leg swelling.  Reports lightheadedness and dizziness worse with standing and walking.  States that she does still smoke.  Denies home oxygen use.  Apparently was found to have oxygen saturation 70% with EMS on arrival.  Improved to 88% on nonrebreather.  Patient received 2 DuoNebs, 125 Solu-Medrol and mag with EMS.  HPI     Home Medications Prior to Admission medications   Medication Sig Start Date End Date Taking? Authorizing Provider  albuterol (VENTOLIN HFA) 108 (90 Base) MCG/ACT inhaler Inhale 2 puffs into the lungs every 6 (six) hours as needed for shortness of breath.    [provider]  amLODipine (NORVASC) 10 MG tablet Take 10 mg by mouth in the morning.    [provider]  ARIPiprazole (ABILIFY) 2 MG tablet Take 2 mg by mouth every morning.    [provider]  aspirin EC 81 MG tablet Take 81 mg by mouth in the morning.    [provider]  bisoprolol (ZEBETA) 5 MG tablet Take 1 tablet (5 mg total) by mouth daily. 02/14/24   Marykay Lex, MD  Brimonidine Tartrate (LUMIFY) 0.025 % SOLN Place 1 drop into both eyes daily as needed (redness).    [provider]  Budeson-Glycopyrrol-Formoterol (BREZTRI AEROSPHERE) 160-9-4.8 MCG/ACT AERO Inhale 1 puff into the lungs daily as needed (shortness of breath).    [provider]  buPROPion (WELLBUTRIN XL) 150 MG 24 hr tablet Take 150 mg by mouth every morning. 08/20/22    [provider]  clopidogrel (PLAVIX) 75 MG tablet TAKE ONE TABLET BY MOUTH ONCE DAILY 12/20/23   Cephus Shelling, MD  desvenlafaxine (PRISTIQ) 100 MG 24 hr tablet Take 100 mg by mouth in the morning.    [provider]  diazepam (VALIUM) 10 MG tablet Take 10 mg by mouth at bedtime as needed for sleep or anxiety.    [provider]  Evolocumab (REPATHA SURECLICK) 140 MG/ML SOAJ INJECT CONTENTS OF 1 PEN SUBCUTANEOUSLY EVERY 14 DAYS. 09/13/23   Marykay Lex, MD  Ferrous Sulfate (IRON HIGH-POTENCY) 45 MG TBCR Take 45 mg by mouth daily.    [provider]  lisinopril (ZESTRIL) 10 MG tablet Take 10 mg by mouth in the morning. 03/22/20   [provider]  Multiple Vitamins-Minerals (CENTRUM SILVER 50+WOMEN) TABS Take 1 tablet by mouth daily.    [provider]  naphazoline-pheniramine (VISINE) 0.025-0.3 % ophthalmic solution Place 1 drop into both eyes 4 (four) times daily as needed (dry/irritated eyes.).    [provider]  oxyCODONE-acetaminophen (PERCOCET) 10-325 MG tablet Take 1 tablet by mouth every 6 (six) hours as needed for pain. 12/26/22   [provider]  pantoprazole (PROTONIX) 40 MG tablet Take 40 mg by mouth 2 (two) times daily as needed (acid reflux). 10/02/23   [provider]  prazosin (MINIPRESS) 1 MG capsule Take 1 mg by mouth at bedtime.    [provider]  Propylhexedrine (BENZEDREX NA) Place 1 spray into the nose 2 (two) times daily as needed (nasal congestion.).    [provider]  traZODone (DESYREL) 50 MG tablet Take 50 mg by mouth at bedtime.    [provider]      Allergies    Penicillins, Nucynta er [tapentadol hcl er], Statins, and Lyrica [pregabalin]    Review of Systems   Review of Systems  Physical Exam Updated Vital Signs BP 124/75   Pulse (!) 119   Temp 98.4 F (36.9 C) (Oral)   Resp (!) 37   Ht 5\' 5"  (1.651 m)   Wt 55.8 kg   SpO2 96%   BMI 20.47  kg/m  Physical Exam  ED Results / Procedures / Treatments   Labs (all labs ordered are listed, but only abnormal results are displayed) Labs Reviewed  RESP PANEL BY RT-PCR (RSV, FLU A&B, COVID)  RVPGX2 - Abnormal; Notable for the following components:      Result Value   Influenza A by PCR POSITIVE (*)    All other components within normal limits  CBC - Abnormal; Notable for the following components:   WBC 11.0 (*)    RBC 3.82 (*)    All other components within normal limits  COMPREHENSIVE METABOLIC PANEL - Abnormal; Notable for the following components:   Sodium 133 (*)    Chloride 97 (*)    Glucose, Bld 127 (*)    Calcium 8.8 (*)    Total Protein 6.2 (*)    Albumin 3.2 (*)    AST 46 (*)    All other components within normal limits  D-DIMER, QUANTITATIVE - Abnormal; Notable for the following components:   D-Dimer, Quant 1.09 (*)    All other components within normal limits  I-STAT VENOUS BLOOD GAS, ED - Abnormal; Notable for the following components:   pCO2, Ven 39.7 (*)    Sodium 132 (*)    Calcium, Ion 1.11 (*)    All other components within normal limits  I-STAT CHEM 8, ED - Abnormal; Notable for the following components:   Sodium 131 (*)    Chloride 97 (*)    Glucose, Bld 123 (*)    Calcium, Ion 1.09 (*)    All other components within normal limits    EKG None  Radiology DG Chest Portable 1 View Result Date: 03/07/2024 CLINICAL DATA:  Short of breath for 1-2 days, dyspnea on exertion EXAM: PORTABLE CHEST 1 VIEW COMPARISON:  07/24/2019 FINDINGS: Single frontal view of the chest demonstrates an unremarkable cardiac silhouette. There is increased bronchovascular prominence at the lung bases in comparison to prior exam, which could reflect sequela of underlying bronchitis or bronchiectasis. No focal airspace disease, effusion, or pneumothorax. No acute bony abnormalities. IMPRESSION: 1. Increased bibasilar bronchovascular prominence, which could reflect sequela of  bronchiectasis or bronchitis. Electronically Signed   By: Sharlet Salina M.D.   On: 03/07/2024 17:30    Procedures .Critical Care  Performed by: Al Decant, PA-C Authorized by: Al Decant, PA-C   Critical care provider statement:    Critical care time (minutes):  40   Critical care time was exclusive of:  Separately billable procedures and treating other patients   Critical care was necessary to treat or prevent imminent or life-threatening deterioration of the following conditions:  Respiratory failure   Critical care was time spent personally by me on the following activities:  Blood draw for specimens, development of treatment plan with patient or surrogate,  discussions with consultants, discussions with primary provider, evaluation of patient's response to treatment, examination of patient, interpretation of cardiac output measurements, obtaining history from patient or surrogate, ordering and performing treatments and interventions, ordering and review of laboratory studies, ordering and review of radiographic studies, pulse oximetry, re-evaluation of patient's condition and review of old charts   I assumed direction of critical care for this patient from another provider in my specialty: no      Medications Ordered in ED Medications  sodium chloride 0.9 % bolus 1,000 mL (has no administration in time range)  ipratropium-albuterol (DUONEB) 0.5-2.5 (3) MG/3ML nebulizer solution 3 mL (3 mLs Nebulization Given 03/07/24 1557)    ED Course/ Medical Decision Making/ A&P Clinical Course as of 03/07/24 1739  Fri Mar 07, 2024  1724 Age adjusted ddimer makes chance of VTE "unlikely" in this patient. [CG]    Clinical Course User Index [CG] Al Decant, PA-C   Medical Decision Making Amount and/or Complexity of Data Reviewed Labs: ordered. Radiology: ordered.  Risk Prescription drug management.   73 year old female presents for evaluation of shortness of  breath.  Please see HPI for further details.  On examination the patient is afebrile, tachycardic to 119.  Lung sounds have wheezing throughout with diminished breath sounds, patient hypoxic on room air.  Abdomen soft and compressible throughout with no tenderness noted.  Neuroexam at baseline.  No edema to bilateral lower extremities.  Favor COPD exacerbation.  Patient states she has COPD, continues to smoke.  Patient arrives on oxygen however denies using oxygen at home or at baseline.  Will collect CBC, CMP, viral panel, i-STAT Chem-8, i-STAT VBG, D-dimer, chest x-ray.  Patient provided with Solu-Medrol, DuoNebs and route with EMS.  Also given mag.  Patient CBC here with leukocytosis to 11, no anemia.  Metabolic panel with sodium 133, glucose 127, no electrolyte derangement, anion gap 13.  Patient viral panel positive for influenza A.  I-STAT VBG, i-STAT Chem-8 unremarkable.  D-dimer elevated however once age-adjusted shows low likelihood of VTE/PE.  Chest x-ray shows bronchitis.  EKG shows sinus tachycardia.  Patient given DuoNeb.  Patient was found to be hypoxic with EMS so anticipate admission.  Signed out to Dr. Fredderick Phenix.   Final Clinical Impression(s) / ED Diagnoses Final diagnoses:  COPD exacerbation (HCC)  Influenza A    Rx / DC Orders ED Discharge Orders     None           Al Decant, PA-C 03/07/24 1739    Rolan Bucco, MD 03/07/24 Ernestina [place]

## 2024-03-07 NOTE — Hospital Course (Addendum)
 Brief Narrative:   73 year old with history of COPD, CAD status post PCI, PAD status post multiple interventions, HTN, HLD, chronic pain, tobacco use comes to the ED with shortness of breath.  She was found to be 78% on room air had to be placed on nonrebreather and slowly wean down.  She was positive for influenza A. Patient is now down to room air saturating well.  She really wants to go home today therefore we will discharge her on p.o. prednisone and Tamiflu to complete the course.  She is also been prescribed albuterol.  Assessment & Plan:  Principal Problem:   Acute respiratory failure with hypoxia (HCC) Active Problems:   Chronic obstructive pulmonary disease with (acute) exacerbation (HCC)   Influenza A   Essential hypertension   PAD (peripheral artery disease) (HCC)   CAD S/P percutaneous coronary angioplasty   Chronic pain syndrome   Acute hypoxic respiratory failure due to COPD exacerbation triggered by influenza A: - Her symptoms have now resolved and feels significantly well even with ambulation no signs of hypoxia.  Upon discharge we will give her 4 days of p.o. Tamiflu and 3 days of p.o. prednisone.  Hyperglycemia - Secondary to steroid use.  No need for aggressive treatment   CAD/PAD: Chest pain-free Continue aspirin, Plavix.  Not on statin due to reported side effect.   Hypertension: BP stable.  Continue amlodipine, bisoprolol, lisinopril. Home medications upon discharge   Chronic pain syndrome Continue home Percocet as needed.   Depression Continue Pristiq and Abilify.   Tobacco use Patient reports smoking 0.5 PPD.  Smoking cessation advised.  Nicotine patch provided.   DVT prophylaxis: enoxaparin (LOVENOX) injection 40 mg Start: 03/07/24 2015    Code Status: Full Code Family Communication:   Discharge today    Subjective: Feeling well.  Wants to go home With ambulation her oxygen saturation remains greater than 88 on room  air   Examination:  General exam: Appears calm and comfortable  Respiratory system: Clear to auscultation. Respiratory effort normal. Cardiovascular system: S1 & S2 heard, RRR. No JVD, murmurs, rubs, gallops or clicks. No pedal edema. Gastrointestinal system: Abdomen is nondistended, soft and nontender. No organomegaly or masses felt. Normal bowel sounds heard. Central nervous system: Alert and oriented. No focal neurological deficits. Extremities: Symmetric 5 x 5 power. Skin: No rashes, lesions or ulcers Psychiatry: Judgement and insight appear normal. Mood & affect appropriate.

## 2024-03-07 NOTE — H&P (Signed)
 History and Physical    Brenda Carney:[id number] DOB: May 11, 1951 DOA: 03/07/2024  PCP: Melida Quitter, MD  Patient coming from: Home  I have personally briefly reviewed patient's old medical records in [facility]  Chief Complaint: Shortness of breath  HPI: Brenda Carney is a 73 y.o. female with medical history significant for COPD, CAD s/p PCI, PAD s/p multiple interventions to b/l LEs, HTN, HLD, chronic pain syndrome, tobacco use who presented to the ED for evaluation of shortness of breath.  Patient reports shortness of breath since yesterday.  She had some transient relief with her home inhalers however symptoms significantly worsen today.  She had to call EMS.  On their arrival she was noted to have SpO2 78% while on room air.  She was placed on NRB with SpO2 improved to 88%.  Prior to arrival to the ED she was given 125 mg Solu-Medrol, magnesium, and 2 DuoNeb treatments.  Patient denies any cough, chest pain, fevers, chills, diaphoresis, nausea, vomiting, abdominal pain, dysuria, diarrhea.  She reports ongoing tobacco use, smoking about 0.5 PPD.  ED Course  Labs/Imaging on admission: I have personally reviewed following labs and imaging studies.  Initial vitals showed BP 124/75, pulse 120, RR 37, temp 98.4 F, SpO2 96%.  Labs showed WBC 11.0, hemoglobin 12.9, platelets 281,000, sodium 133, potassium 3.5, bicarb 23, BUN 13, creatinine 0.75, serum glucose 127.  Influenza A positive.  COVID and RSV negative.  Portable chest x-ray showed increased bibasilar bronchovascular prominence.  Patient was given 1 L normal saline, DuoNeb treatment.  The hospitalist service was consulted to admit.  Review of Systems: All systems reviewed and are negative except as documented in history of present illness above.   Past Medical History:  Diagnosis Date   Arthritis    "thumbs; hips" (05/14/2017)   CAD S/P 2 V PCI (pRCA, PCx-OM)    Coronary calcium scoring: June 2018: Score  1106. 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   Cervical spondylosis    Chronic lower back pain    COPD (chronic obstructive pulmonary disease) (HCC)    "little bit" (05/14/2017)   Depression    Exercise-induced asthma with acute exacerbation    "only in very hot weather" (05/14/2017)   GERD (gastroesophageal reflux disease)    History of IBS    resovlved   Hx of multiple concussions    from horse training and riding   Hyperlipidemia with target LDL less than 70    Mostly statin intolerant,. 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   Pneumonia 2020   Sleep apnea    does not wear CPAP; "think it was medication related" (05/14/2017)   Stroke [facility]) 2010   TIA   Thoracic aortic ectasia (HCC)    ~40 mm on Coronary Calcium Score CT - recommend f/u CTA or MRA   TIA (transient ischemic attack) 09/2011   "several"    Past Surgical History:  Procedure Laterality Date   ABDOMINAL AORTOGRAM W/LOWER EXTREMITY N/A 05/14/2017   Procedure: Abdominal Aortogram w/Lower Extremity;  Surgeon: Imogene Burn,  Ottie Glazier, MD;  Location: [facility] INVASIVE CV LAB;  Service: Cardiovascular;  Laterality: N/A;   ABDOMINAL AORTOGRAM W/LOWER EXTREMITY N/A 08/31/2022   Procedure: ABDOMINAL AORTOGRAM W/LOWER EXTREMITY;  Surgeon: Cephus Shelling, MD;  Location: [facility] CV LAB;  Service: Cardiovascular;  Laterality: N/A;   ABDOMINAL AORTOGRAM W/LOWER EXTREMITY N/A 12/07/2022   Procedure: ABDOMINAL AORTOGRAM W/LOWER EXTREMITY;  Surgeon: Cephus Shelling, MD;  Location: [facility] CV LAB;   Service: Cardiovascular;  Laterality: N/A;   ABDOMINAL AORTOGRAM W/LOWER EXTREMITY Right 10/25/2023   Procedure: ABDOMINAL AORTOGRAM W/LOWER EXTREMITY;  Surgeon: Cephus Shelling, MD;  Location: [facility] CV LAB;  Service: Cardiovascular;  Laterality: Right;   ACROMIO-CLAVICULAR JOINT REPAIR Left 1990s   "I think it was called an [facility] joint removal"   BACK SURGERY     BYPASS GRAFT POPLITEAL TO POPLITEAL Right 05/15/2017   Procedure: BYPASS GRAFT ABOVE KNEE POPLITEAL TO BELOW KNEE POPLITEAL ARTERY;  Surgeon: Fransisco Hertz, MD;  Location: [facility] OR;  Service: Vascular;  Laterality: Right;   CARDIAC CATHETERIZATION     CORONARY STENT INTERVENTION N/A 09/30/2021   Procedure: CORONARY STENT INTERVENTION;  Surgeon: Marykay Lex, MD;  Location: [facility];  Service: Cardiovascular;  Laterality: N/A;   CORONARY ULTRASOUND/IVUS N/A 09/30/2021   Procedure: Intravascular Ultrasound/IVUS;  Surgeon: Marykay Lex, MD;  Location[facility] INVASIVE CV LAB;  Service: Cardiovascular;  Laterality: N/A;   ENDARTERECTOMY FEMORAL Left 09/11/2022   Procedure: LEFT COMMON FEMORAL ENDARTERECTOMY;  Surgeon: Cephus Shelling, MD;  Location: [facility] OR;  Service: Vascular;  Laterality: Left;   ESOPHAGOGASTRODUODENOSCOPY (EGD) WITH PROPOFOL N/A 04/29/2018   Procedure: ESOPHAGOGASTRODUODENOSCOPY (EGD) WITH PROPOFOL;  Surgeon: Kathi Der, MD;  Location: [facility] ENDOSCOPY;  Service: Gastroenterology;  Laterality: N/A;   FOOT FRACTURE SURGERY Bilateral    multiple procedures   FRACTURE SURGERY     JOINT REPLACEMENT     LEFT HEART CATH AND CORONARY ANGIOGRAPHY N/A 09/30/2021   Procedure: LEFT HEART CATH AND CORONARY ANGIOGRAPHY;  Surgeon: Marykay Lex, MD;  Location: [facility] INVASIVE CV LAB;  Service: Cardiovascular;  Laterality: N/A;   LOWER EXTREMITY ANGIOGRAPHY N/A 06/16/2020   Procedure: LOWER EXTREMITY ANGIOGRAPHY;  Surgeon: Cephus Shelling, MD;  [facility]: [facility];  Service: Cardiovascular;   Laterality: N/A;   MAXIMUM ACCESS (MAS)POSTERIOR LUMBAR INTERBODY FUSION (PLIF) 1 LEVEL N/A 09/30/2014   Procedure: FOR MAXIMUM ACCESS (MAS) POSTERIOR LUMBAR INTERBODY FUSION (PLIF) 1 LEVEL;  Surgeon: Tia Alert, MD;  Location: [facility] NEURO ORS;  Service: Neurosurgery;  Laterality: N/A;  FOR MAXIMUM ACCESS (MAS) POSTERIOR LUMBAR INTERBODY FUSION (PLIF) 1 LEVEL LUMBAR 4-5   MULTIPLE TOOTH EXTRACTIONS Right 04/2017   NM MYOVIEW LTD  06/2017    Normal EF, 66%. No ST changes. Normal study. LOW RISK.   PATCH ANGIOPLASTY Left 09/11/2022   Procedure: LEFT FEMORAL PATCH ANGIOPLASTY USING XENOSURE BIOLOGIC PATCH 1CMX14CM;  Surgeon: Cephus Shelling, MD;  Location: [facility] OR;  Service: Vascular;  Laterality: Left;   PERIPHERAL VASCULAR ATHERECTOMY Left 06/16/2020   Procedure: PERIPHERAL VASCULAR ATHERECTOMY;  Surgeon: Cephus Shelling, MD;  Location: [facility] INVASIVE CV LAB;  Service: Cardiovascular;  Laterality: Left;  SFA   PERIPHERAL VASCULAR ATHERECTOMY Left 12/07/2022   Procedure: PERIPHERAL VASCULAR ATHERECTOMY;  Surgeon: Cephus Shelling, MD;  Location: [facility];  Service: Cardiovascular;  Laterality: Left;   PERIPHERAL VASCULAR INTERVENTION Right 08/31/2022   Procedure: PERIPHERAL VASCULAR INTERVENTION;  Surgeon: Cephus Shelling, MD;  Location: [facility] INVASIVE  CV LAB;  Service: Cardiovascular;  Laterality: Right;   PERIPHERAL VASCULAR INTERVENTION  12/07/2022   Procedure: PERIPHERAL VASCULAR INTERVENTION;  Surgeon: Cephus Shelling, MD;  Location: [facility]VASIVE CV LAB;  Service: Cardiovascular;;   PERIPHERAL VASCULAR INTERVENTION Right 10/25/2023   Procedure: PERIPHERAL VASCULAR INTERVENTION;  Surgeon: Cephus Shelling, MD;  Location: [facility] INVASIVE CV LAB;  Service: Cardiovascular;  Laterality: Right;  right external iliac   TOTAL HIP ARTHROPLASTY Right 01/16/2020   Procedure: RIGHT TOTAL HIP ARTHROPLASTY ANTERIOR APPROACH;  Surgeon: Kathryne Hitch, MD;  Location: [facility] ORS;   Service: Orthopedics;  Laterality: Right;   TOTAL HIP ARTHROPLASTY Left 04/30/2020   Procedure: LEFT TOTAL HIP ARTHROPLASTY ANTERIOR APPROACH;  Surgeon: Kathryne Hitch, MD;  Location: [facility] ORS;  Service: Orthopedics;  Laterality: Left;   TRANSTHORACIC ECHOCARDIOGRAM  09/07/2013   Done to evaluate TIA: Normal LV size and function. EF 55-60%. GR 1 DD. Mild left atrial dilation. Mildly calcified mitral leaflets. Otherwise normal.    Social History: Patient reports smoking 0.5 PPD.  Allergies  Allergen Reactions   Penicillins Swelling and Other (See Comments)    TONGUE SWELLING    Nucynta Er [Tapentadol Hcl Er] Other (See Comments)    Dry mouth   Statins     muscle pain   Lyrica [Pregabalin] Other (See Comments)    DRY MOUTH    Family History  Problem Relation Age of Onset   Stroke Father    Heart attack Mother    Stroke Mother      Prior to Admission medications   Medication Sig Start Date End Date Taking? Authorizing Provider  albuterol (VENTOLIN HFA) 108 (90 Base) MCG/ACT inhaler Inhale 2 puffs into the lungs every 6 (six) hours as needed for shortness of breath.    [provider]  amLODipine (NORVASC) 10 MG tablet Take 10 mg by mouth in the morning.    [provider]  ARIPiprazole (ABILIFY) 2 MG tablet Take 2 mg by mouth every morning.    [provider]  aspirin EC 81 MG tablet Take 81 mg by mouth in the morning.    [provider]  bisoprolol (ZEBETA) 5 MG tablet Take 1 tablet (5 mg total) by mouth daily. 02/14/24   Marykay Lex, MD  Brimonidine Tartrate (LUMIFY) 0.025 % SOLN Place 1 drop into both eyes daily as needed (redness).    [provider]  Budeson-Glycopyrrol-Formoterol (BREZTRI AEROSPHERE) 160-9-4.8 MCG/ACT AERO Inhale 1 puff into the lungs daily as needed (shortness of breath).    [provider]  buPROPion (WELLBUTRIN XL) 150 MG 24 hr tablet Take 150 mg by mouth every morning. 08/20/22   [provider]  clopidogrel (PLAVIX) 75 MG tablet TAKE ONE TABLET BY MOUTH ONCE DAILY 12/20/23   Cephus Shelling, MD  desvenlafaxine (PRISTIQ) 100 MG 24 hr tablet Take 100 mg by mouth in the morning.    [provider]  diazepam (VALIUM) 10 MG tablet Take 10 mg by mouth at bedtime as needed for sleep or anxiety.    [provider]  Evolocumab (REPATHA SURECLICK) 140 MG/ML SOAJ INJECT CONTENTS OF 1 PEN SUBCUTANEOUSLY EVERY 14 DAYS. 09/13/23   Marykay Lex, MD  Ferrous Sulfate (IRON HIGH-POTENCY) 45 MG TBCR Take 45 mg by mouth daily.    [provider]  lisinopril (ZESTRIL) 10 MG tablet Take 10 mg by mouth in the morning. 03/22/20   [provider]  Multiple Vitamins-Minerals (CENTRUM SILVER 50+WOMEN) TABS Take  1 tablet by mouth daily.    [provider]  naphazoline-pheniramine (VISINE) 0.025-0.3 % ophthalmic solution Place 1 drop into both eyes 4 (four) times daily as needed (dry/irritated eyes.).    [provider]  oxyCODONE-acetaminophen (PERCOCET) 10-325 MG tablet Take 1 tablet by mouth every 6 (six) hours as needed for pain. 12/26/22   [provider]  pantoprazole (PROTONIX) 40 MG tablet Take 40 mg by mouth 2 (two) times daily as needed (acid reflux). 10/02/23   [provider]  prazosin (MINIPRESS) 1 MG capsule Take 1 mg by mouth at bedtime.    [provider]  Propylhexedrine (BENZEDREX NA) Place 1 spray into the nose 2 (two) times daily as needed (nasal congestion.).    [provider]  traZODone (DESYREL) 50 MG tablet Take 50 mg by mouth at bedtime.    [provider]    Physical Exam: Vitals:   03/07/24 1730 03/07/24 1745 03/07/24 1754 03/07/24 1930  BP: 120/70 123/67  119/83  Pulse: (!) 113 (!) 111  (!) 104  Resp: (!) 21 19  (!) 27  Temp:   98.3 F (36.8 C) 98.8 F (37.1 C)  TempSrc:   Oral Oral  SpO2: 100% 100%  100%  Weight:      Height:       Constitutional: Resting in  bed, NAD, calm, comfortable Eyes: EOMI, lids and conjunctivae normal ENMT: Mucous membranes are moist. Posterior pharynx clear of any exudate or lesions.Normal dentition.  Neck: normal, supple, no masses. Respiratory: End expiratory wheezing throughout. Normal respiratory effort while on NRB. No accessory muscle use.  Cardiovascular: Tachycardic, no murmurs / rubs / gallops. No extremity edema. 2+ pedal pulses. Abdomen: no tenderness, no masses palpated. Musculoskeletal: no clubbing / cyanosis. No joint deformity upper and lower extremities. Good ROM, no contractures. Normal muscle tone.  Skin: no rashes, lesions, ulcers. No induration Neurologic: Sensation intact. Strength 5/5 in all 4.  Psychiatric: Normal judgment and insight. Alert and oriented x 3. Normal mood.   EKG: Personally reviewed. Sinus tachycardia, rate 117, no acute ischemic changes.  Rate is faster when compared to prior from December 2023.  Assessment/Plan Principal Problem:   Acute respiratory failure with hypoxia (HCC) Active Problems:   Chronic obstructive pulmonary disease with (acute) exacerbation (HCC)   Influenza A   Essential hypertension   PAD (peripheral artery disease) (HCC)   CAD S/P percutaneous coronary angioplasty   Chronic pain syndrome   Brenda Carney is a 73 y.o. female with medical history significant for COPD, CAD s/p PCI, PAD s/p multiple interventions to b/l LEs, HTN, HLD, chronic pain syndrome who is admitted with acute hypoxic respiratory failure due to COPD exacerbation triggered by influenza A.  Assessment and Plan: Acute hypoxic respiratory failure due to COPD exacerbation triggered by influenza A: Patient not requiring supplemental oxygen at baseline admitted with hypoxia with SpO2 as low as 78% on RA PTA secondary to COPD exacerbation triggered by influenza A.  Required NRB on arrival.  Still wheezing on exam. -Start scheduled Brovana/Pulmicort with DuoNebs as needed -IV Solu-Medrol 40 mg  twice daily -Start Tamiflu -Continue supplemental oxygen and wean as able  CAD/PAD: Continue aspirin, Plavix.  Not on statin due to reported side effect.  Hypertension: BP stable.  Continue amlodipine, bisoprolol, lisinopril.  Chronic pain syndrome: Continue home Percocet as needed.  Depression: Continue Pristiq and Abilify.  Tobacco use: Patient reports smoking 0.5 PPD.  Smoking cessation advised.  Nicotine patch provided.   DVT  prophylaxis: enoxaparin (LOVENOX) injection 40 mg Start: 03/07/24 2015 Code Status: Full code, confirmed with patient on admission Family Communication: Discussed with patient, she has discussed with her close contacts Disposition Plan: From home and likely return to home pending clinical progress Consults called: None Severity of Illness: The appropriate patient status for this patient is OBSERVATION. Observation status is judged to be reasonable and necessary in order to provide the required intensity of service to ensure the patient's safety. The patient's presenting symptoms, physical exam findings, and initial radiographic and laboratory data in the context of their medical condition is felt to place them at decreased risk for further clinical deterioration. Furthermore, it is anticipated that the patient will be medically stable for discharge from the hospital within 2 midnights of admission.   Darreld Mclean MD [facility]  If 7PM-7AM, please contact night-coverage [email or web address]  03/07/2024, 8:24 PM

## 2024-03-07 NOTE — ED Triage Notes (Signed)
 Patient Brenda Carney [facility] from home with complaints of increased shortness of breath over the past 1-2 days. Shortnesss of breath increases with exertion. Initial oxygen sats were 78% on room air and 88% on NRB with EMS.   Patient has a history of COPD and asthma and takes albuterol at home. Albuterol did not help her symptoms.   Alert and oriented x4 on arrival.

## 2024-03-08 ENCOUNTER — Other Ambulatory Visit (HOSPITAL_COMMUNITY): Payer: Self-pay

## 2024-03-08 DIAGNOSIS — J9601 Acute respiratory failure with hypoxia: Secondary | ICD-10-CM | POA: Diagnosis not present

## 2024-03-08 LAB — CBC
HCT: 39.3 % (ref 36.0–46.0)
Hemoglobin: 13.6 g/dL (ref 12.0–15.0)
MCH: 33.2 pg (ref 26.0–34.0)
MCHC: 34.6 g/dL (ref 30.0–36.0)
MCV: 95.9 fL (ref 80.0–100.0)
Platelets: 311 10*3/uL (ref 150–400)
RBC: 4.1 MIL/uL (ref 3.87–5.11)
RDW: 12.4 % (ref 11.5–15.5)
WBC: 9 10*3/uL (ref 4.0–10.5)
nRBC: 0 % (ref 0.0–0.2)

## 2024-03-08 LAB — BASIC METABOLIC PANEL
Anion gap: 11 (ref 5–15)
BUN: 13 mg/dL (ref 8–23)
CO2: 22 mmol/L (ref 22–32)
Calcium: 8.3 mg/dL — ABNORMAL LOW (ref 8.9–10.3)
Chloride: 102 mmol/L (ref 98–111)
Creatinine, Ser: 0.75 mg/dL (ref 0.44–1.00)
GFR, Estimated: >60 mL/min (ref >60)
Glucose, Bld: 244 mg/dL — ABNORMAL HIGH (ref 70–99)
Potassium: 3.7 mmol/L (ref 3.5–5.1)
Sodium: 135 mmol/L (ref 135–145)

## 2024-03-08 MED ORDER — HYDRALAZINE HCL 20 MG/ML IJ SOLN: 10.0000 mg | INTRAMUSCULAR | Status: DC | PRN

## 2024-03-08 MED ORDER — PREDNISONE 20 MG PO TABS
40.0000 mg | ORAL_TABLET | Freq: Every day | ORAL | 0 refills | Status: AC
  Filled 2024-03-08: qty 6, 3d supply, fill #0

## 2024-03-08 MED ORDER — POTASSIUM CHLORIDE CRYS ER 20 MEQ PO TBCR
40.0000 meq | EXTENDED_RELEASE_TABLET | Freq: Once | ORAL | Status: AC
  Administered 2024-03-08: 40 meq via ORAL
  Filled 2024-03-08: qty 2

## 2024-03-08 MED ORDER — ALBUTEROL SULFATE HFA 108 (90 BASE) MCG/ACT IN AERS
2.0000 | INHALATION_SPRAY | Freq: Four times a day (QID) | RESPIRATORY_TRACT | 0 refills | Status: AC | PRN
  Filled 2024-03-08: qty 18, 25d supply, fill #0

## 2024-03-08 MED ORDER — OSELTAMIVIR PHOSPHATE 30 MG PO CAPS
30.0000 mg | ORAL_CAPSULE | Freq: Two times a day (BID) | ORAL | 0 refills | Status: AC
  Filled 2024-03-08: qty 8, 4d supply, fill #0

## 2024-03-08 MED ORDER — TRAZODONE HCL 50 MG PO TABS: 50.0000 mg | ORAL_TABLET | Freq: Every evening | ORAL | Status: DC | PRN

## 2024-03-08 NOTE — Care Management Obs Status (Signed)
 MEDICARE OBSERVATION STATUS NOTIFICATION   Patient Details  Name: Brenda Carney MRN: [id number] Date of Birth: 06/29/1951   Medicare Observation Status Notification Given:  Yes    Lesa Vandall G., RN 03/08/2024, 8:56 AM

## 2024-03-08 NOTE — Plan of Care (Signed)
   Problem: Education: Goal: Knowledge of General Education information will improve Description Including pain rating scale, medication(s)/side effects and non-pharmacologic comfort measures Outcome: Progressing   Problem: Health Behavior/Discharge Planning: Goal: Ability to manage health-related needs will improve Outcome: Progressing

## 2024-03-08 NOTE — Discharge Summary (Signed)
 Physician Discharge Summary  Brenda Carney NWG:[id number] DOB: [birth year] DOA: 03/07/2024  PCP: Melida Quitter, MD  Admit date: 03/07/2024 Discharge date: 03/08/2024  Admitted From: Home Disposition: Home  Recommendations for Outpatient Follow-up:  Follow up with PCP in 1-2 weeks Please obtain BMP/CBC in one week your next doctors visit.  P.o. Tamiflu when prednisone prescribed   Discharge Condition: Stable CODE STATUS: Full code Diet recommendation: Cardiac   Brief Narrative:   73 year old with history of COPD, CAD status post PCI, PAD status post multiple interventions, HTN, HLD, chronic pain, tobacco use comes to the ED with shortness of breath.  She was found to be 78% on room air had to be placed on nonrebreather and slowly wean down.  She was positive for influenza A. Patient is now down to room air saturating well.  She really wants to go home today therefore we will discharge her on p.o. prednisone and Tamiflu to complete the course.  She is also been prescribed albuterol.  Assessment & Plan:  Principal Problem:   Acute respiratory failure with hypoxia (HCC) Active Problems:   Chronic obstructive pulmonary disease with (acute) exacerbation (HCC)   Influenza A   Essential hypertension   PAD (peripheral artery disease) (HCC)   CAD S/P percutaneous coronary angioplasty   Chronic pain syndrome   Acute hypoxic respiratory failure due to COPD exacerbation triggered by influenza A: - Her symptoms have now resolved and feels significantly well even with ambulation no signs of hypoxia.  Upon discharge we will give her 4 days of p.o. Tamiflu and 3 days of p.o. prednisone.  Hyperglycemia - Secondary to steroid use.  No need for aggressive treatment   CAD/PAD: Chest pain-free Continue aspirin, Plavix.  Not on statin due to reported side effect.   Hypertension: BP stable.  Continue amlodipine, bisoprolol, lisinopril. Home medications upon discharge   Chronic pain  syndrome Continue home Percocet as needed.   Depression Continue Pristiq and Abilify.   Tobacco use Patient reports smoking 0.5 PPD.  Smoking cessation advised.  Nicotine patch provided.   DVT prophylaxis: enoxaparin (LOVENOX) injection 40 mg Start: 03/07/24 2015    Code Status: Full Code Family Communication:   Discharge today    Subjective: Feeling well.  Wants to go home With ambulation her oxygen saturation remains greater than 88 on room air   Examination:  General exam: Appears calm and comfortable  Respiratory system: Clear to auscultation. Respiratory effort normal. Cardiovascular system: S1 & S2 heard, RRR. No JVD, murmurs, rubs, gallops or clicks. No pedal edema. Gastrointestinal system: Abdomen is nondistended, soft and nontender. No organomegaly or masses felt. Normal bowel sounds heard. Central nervous system: Alert and oriented. No focal neurological deficits. Extremities: Symmetric 5 x 5 power. Skin: No rashes, lesions or ulcers Psychiatry: Judgement and insight appear normal. Mood & affect appropriate.    Discharge Diagnoses:  Principal Problem:   Acute respiratory failure with hypoxia (HCC) Active Problems:   Chronic obstructive pulmonary disease with (acute) exacerbation (HCC)   Influenza A   Essential hypertension   PAD (peripheral artery disease) (HCC)   CAD S/P percutaneous coronary angioplasty   Chronic pain syndrome      Discharge Exam: Vitals:   03/08/24 0425 03/08/24 0842  BP: 131/74 117/73  Pulse: 91 84  Resp: 18 18  Temp: 98 F (36.7 C) 98.7 F (37.1 C)  SpO2: 90%    Vitals:   03/07/24 2156 03/07/24 2200 03/08/24 0425 03/08/24 0842  BP:   131/74 117/73  Pulse:   91 84  Resp:   18 18  Temp:   98 F (36.7 C) 98.7 F (37.1 C)  TempSrc:   Oral Axillary  SpO2: 93% 94% 90%   Weight:      Height:          Discharge Instructions   Allergies as of 03/08/2024       Reactions   Penicillins Swelling, Other (See Comments)    TONGUE SWELLING   Nucynta Er [tapentadol Hcl Er] Other (See Comments)   Dry mouth   Statins    muscle pain   Lyrica [pregabalin] Other (See Comments)   DRY MOUTH        Medication List     TAKE these medications    albuterol 108 (90 Base) MCG/ACT inhaler Commonly known as: VENTOLIN HFA Inhale 2 puffs into the lungs every 6 (six) hours as needed for shortness of breath.   amLODipine 10 MG tablet Commonly known as: NORVASC Take 10 mg by mouth in the morning.   ARIPiprazole 2 MG tablet Commonly known as: ABILIFY Take 2 mg by mouth every morning.   aspirin EC 81 MG tablet Take 81 mg by mouth in the morning.   BENZEDREX NA Place 1 spray into the nose 2 (two) times daily as needed (nasal congestion.).   bisoprolol 5 MG tablet Commonly known as: ZEBETA Take 1 tablet (5 mg total) by mouth daily.   Breztri Aerosphere 160-9-4.8 MCG/ACT Aero Generic drug: budeson-glycopyrrolate-formoterol Inhale 1 puff into the lungs daily as needed (shortness of breath).   buPROPion 150 MG 24 hr tablet Commonly known as: WELLBUTRIN XL Take 150 mg by mouth every morning.   Centrum Silver 50+Women Tabs Take 1 tablet by mouth daily.   clopidogrel 75 MG tablet Commonly known as: PLAVIX TAKE ONE TABLET BY MOUTH ONCE DAILY   desvenlafaxine 100 MG 24 hr tablet Commonly known as: PRISTIQ Take 100 mg by mouth in the morning.   diazepam 10 MG tablet Commonly known as: VALIUM Take 10 mg by mouth at bedtime as needed for sleep or anxiety.   Iron High-Potency 45 MG Tbcr Take 45 mg by mouth daily.   lisinopril 10 MG tablet Commonly known as: ZESTRIL Take 10 mg by mouth in the morning.   Lumify 0.025 % Soln Generic drug: Brimonidine Tartrate Place 1 drop into both eyes daily as needed (redness).   oseltamivir 30 MG capsule Commonly known as: TAMIFLU Take 1 capsule (30 mg total) by mouth 2 (two) times daily for 4 days.   oxyCODONE-acetaminophen 10-325 MG tablet Commonly known as:  PERCOCET Take 1 tablet by mouth every 6 (six) hours as needed for pain.   pantoprazole 40 MG tablet Commonly known as: PROTONIX Take 40 mg by mouth 2 (two) times daily as needed (acid reflux).   prazosin 1 MG capsule Commonly known as: MINIPRESS Take 1 mg by mouth at bedtime.   predniSONE 20 MG tablet Commonly known as: DELTASONE Take 2 tablets (40 mg total) by mouth daily with breakfast for 3 days. Start taking on: March 09, 2024   Repatha SureClick 140 MG/ML Soaj Generic drug: Evolocumab INJECT CONTENTS OF 1 PEN SUBCUTANEOUSLY EVERY 14 DAYS.   traZODone 50 MG tablet Commonly known as: DESYREL Take 50 mg by mouth at bedtime.   Visine 0.025-0.3 % ophthalmic solution Generic drug: naphazoline-pheniramine Place 1 drop into both eyes 4 (four) times daily as needed (dry/irritated eyes.).        Allergies  Allergen Reactions  Penicillins Swelling and Other (See Comments)    TONGUE SWELLING    Nucynta Er [Tapentadol Hcl Er] Other (See Comments)    Dry mouth   Statins     muscle pain   Lyrica [Pregabalin] Other (See Comments)    DRY MOUTH    You were cared for by a hospitalist during your hospital stay. If you have any questions about your discharge medications or the care you received while you were in the hospital after you are discharged, you can call the unit and asked to speak with the hospitalist on call if the hospitalist that took care of you is not available. Once you are discharged, your primary care physician will handle any further medical issues. Please note that no refills for any discharge medications will be authorized once you are discharged, as it is imperative that you return to your primary care physician (or establish a relationship with a primary care physician if you do not have one) for your aftercare needs so that they can reassess your need for medications and monitor your lab values.  You were cared for by a hospitalist during your hospital stay. If  you have any questions about your discharge medications or the care you received while you were in the hospital after you are discharged, you can call the unit and asked to speak with the hospitalist on call if the hospitalist that took care of you is not available. Once you are discharged, your primary care physician will handle any further medical issues. Please note that NO REFILLS for any discharge medications will be authorized once you are discharged, as it is imperative that you return to your primary care physician (or establish a relationship with a primary care physician if you do not have one) for your aftercare needs so that they can reassess your need for medications and monitor your lab values.  Please request your Prim.MD to go over all Hospital Tests and Procedure/Radiological results at the follow up, please get all Hospital records sent to your Prim MD by signing hospital release before you go home.  Get CBC, CMP, 2 view Chest X ray checked  by Primary MD during your next visit or SNF MD in 5-7 days ( we routinely change or add medications that can affect your baseline labs and fluid status, therefore we recommend that you get the mentioned basic workup next visit with your PCP, your PCP may decide not to get them or add new tests based on their clinical decision)  On your next visit with your primary care physician please Get Medicines reviewed and adjusted.  If you experience worsening of your admission symptoms, develop shortness of breath, life threatening emergency, suicidal or homicidal thoughts you must seek medical attention immediately by calling 911 or calling your MD immediately  if symptoms less severe.  You Must read complete instructions/literature along with all the possible adverse reactions/side effects for all the Medicines you take and that have been prescribed to you. Take any new Medicines after you have completely understood and accpet all the possible adverse  reactions/side effects.   Do not drive, operate heavy machinery, perform activities at heights, swimming or participation in water activities or provide baby sitting services if your were admitted for syncope or siezures until you have seen by Primary MD or a Neurologist and advised to do so again.  Do not drive when taking Pain medications.   Procedures/Studies: DG Chest Portable 1 View Result Date: 03/07/2024 CLINICAL DATA:  Short of breath for 1-2 days, dyspnea on exertion EXAM: PORTABLE CHEST 1 VIEW COMPARISON:  07/24/2019 FINDINGS: Single frontal view of the chest demonstrates an unremarkable cardiac silhouette. There is increased bronchovascular prominence at the lung bases in comparison to prior exam, which could reflect sequela of underlying bronchitis or bronchiectasis. No focal airspace disease, effusion, or pneumothorax. No acute bony abnormalities. IMPRESSION: 1. Increased bibasilar bronchovascular prominence, which could reflect sequela of bronchiectasis or bronchitis. Electronically Signed   By: Sharlet Salina M.D.   On: 03/07/2024 17:30     The results of significant diagnostics from this hospitalization (including imaging, microbiology, ancillary and laboratory) are listed below for reference.     Microbiology: Recent Results (from the past 240 hours)  Resp panel by RT-PCR (RSV, Flu A&B, Covid) Anterior Nasal Swab     Status: Abnormal   Collection Time: 03/07/24  3:26 PM   Specimen: Anterior Nasal Swab  Result Value Ref Range Status   SARS Coronavirus 2 by RT PCR NEGATIVE NEGATIVE Final   Influenza A by PCR POSITIVE (A) NEGATIVE Final   Influenza B by PCR NEGATIVE NEGATIVE Final    Comment: (NOTE) The Xpert Xpress SARS-CoV-2/FLU/RSV plus assay is intended as an aid in the diagnosis of influenza from Nasopharyngeal swab specimens and should not be used as a sole basis for treatment. Nasal washings and aspirates are unacceptable for Xpert Xpress  SARS-CoV-2/FLU/RSV testing.  Fact Sheet for Patients: BloggerCourse.com  Fact Sheet for Healthcare Providers: SeriousBroker.it  This test is not yet approved or cleared by the [place] FDA and has been authorized for detection and/or diagnosis of SARS-CoV-2 by FDA under an Emergency Use Authorization (EUA). This EUA will remain in effect (meaning this test can be used) for the duration of the COVID-19 declaration under Section 564(b)(1) of the Act, 21 U.S.C. section 360bbb-3(b)(1), unless the authorization is terminated or revoked.     Resp Syncytial Virus by PCR NEGATIVE NEGATIVE Final    Comment: (NOTE) Fact Sheet for Patients: BloggerCourse.com  Fact Sheet for Healthcare Providers: SeriousBroker.it  This test is not yet approved or cleared by the [place] FDA and has been authorized for detection and/or diagnosis of SARS-CoV-2 by FDA under an Emergency Use Authorization (EUA). This EUA will remain in effect (meaning this test can be used) for the duration of the COVID-19 declaration under Section 564(b)(1) of the Act, 21 U.S.C. section 360bbb-3(b)(1), unless the authorization is terminated or revoked.  Performed at [facility], [address], [place], [place] [address]      Labs: BNP (last 3 results) No results for input(s): "BNP" in the last 8760 hours. Basic Metabolic Panel: Recent Labs  Lab 03/07/24 1521 03/07/24 1535 03/08/24 0634  NA 133* 132*  131* 135  K 3.5 3.5  3.5 3.7  CL 97* 97* 102  CO2 23  --  22  GLUCOSE 127* 123* 244*  BUN 13 13 13   CREATININE 0.75 0.60 0.75  CALCIUM 8.8*  --  8.3*   Liver Function Tests: Recent Labs  Lab 03/07/24 1521  AST 46*  ALT 26  ALKPHOS 55  BILITOT 0.5  PROT 6.2*  ALBUMIN 3.2*   No results for input(s): "LIPASE", "AMYLASE" in the last 168 hours. No results for input(s): "AMMONIA" in the  last 168 hours. CBC: Recent Labs  Lab 03/07/24 1521 03/07/24 1535 03/08/24 0634  WBC 11.0*  --  9.0  HGB 12.9 13.6  13.6 13.6  HCT 37.7 40.0  40.0 39.3  MCV 98.7  --  95.9  PLT 281  --  311   Cardiac Enzymes: No results for input(s): "CKTOTAL", "CKMB", "CKMBINDEX", "TROPONINI" in the last 168 hours. BNP: Invalid input(s): "POCBNP" CBG: No results for input(s): "GLUCAP" in the last 168 hours. D-Dimer Recent Labs    03/07/24 1605  DDIMER 1.09*   Hgb A1c No results for input(s): "HGBA1C" in the last 72 hours. Lipid Profile No results for input(s): "CHOL", "HDL", "LDLCALC", "TRIG", "CHOLHDL", "LDLDIRECT" in the last 72 hours. Thyroid function studies No results for input(s): "TSH", "T4TOTAL", "T3FREE", "THYROIDAB" in the last 72 hours.  Invalid input(s): "FREET3" Anemia work up No results for input(s): "VITAMINB12", "FOLATE", "FERRITIN", "TIBC", "IRON", "RETICCTPCT" in the last 72 hours. Urinalysis    Component Value Date/Time   COLORURINE YELLOW 09/11/2022 0743   APPEARANCEUR CLEAR 09/11/2022 0743   LABSPEC 1.011 09/11/2022 0743   PHURINE 5.0 09/11/2022 0743   GLUCOSEU NEGATIVE 09/11/2022 0743   HGBUR NEGATIVE 09/11/2022 0743   BILIRUBINUR NEGATIVE 09/11/2022 0743   KETONESUR NEGATIVE 09/11/2022 0743   PROTEINUR NEGATIVE 09/11/2022 0743   UROBILINOGEN 0.2 10/02/2011 0224   NITRITE NEGATIVE 09/11/2022 0743   LEUKOCYTESUR NEGATIVE 09/11/2022 0743   Sepsis Labs Recent Labs  Lab 03/07/24 1521 03/08/24 0634  WBC 11.0* 9.0   Microbiology Recent Results (from the past 240 hours)  Resp panel by RT-PCR (RSV, Flu A&B, Covid) Anterior Nasal Swab     Status: Abnormal   Collection Time: 03/07/24  3:26 PM   Specimen: Anterior Nasal Swab  Result Value Ref Range Status   SARS Coronavirus 2 by RT PCR NEGATIVE NEGATIVE Final   Influenza A by PCR POSITIVE (A) NEGATIVE Final   Influenza B by PCR NEGATIVE NEGATIVE Final    Comment: (NOTE) The Xpert Xpress  SARS-CoV-2/FLU/RSV plus assay is intended as an aid in the diagnosis of influenza from Nasopharyngeal swab specimens and should not be used as a sole basis for treatment. Nasal washings and aspirates are unacceptable for Xpert Xpress SARS-CoV-2/FLU/RSV testing.  Fact Sheet for Patients: BloggerCourse.com  Fact Sheet for Healthcare Providers: SeriousBroker.it  This test is not yet approved or cleared by the [place] FDA and has been authorized for detection and/or diagnosis of SARS-CoV-2 by FDA under an Emergency Use Authorization (EUA). This EUA will remain in effect (meaning this test can be used) for the duration of the COVID-19 declaration under Section 564(b)(1) of the Act, 21 U.S.C. section 360bbb-3(b)(1), unless the authorization is terminated or revoked.     Resp Syncytial Virus by PCR NEGATIVE NEGATIVE Final    Comment: (NOTE) Fact Sheet for Patients: BloggerCourse.com  Fact Sheet for Healthcare Providers: SeriousBroker.it  This test is not yet approved or cleared by the [place] FDA and has been authorized for detection and/or diagnosis of SARS-CoV-2 by FDA under an Emergency Use Authorization (EUA). This EUA will remain in effect (meaning this test can be used) for the duration of the COVID-19 declaration under Section 564(b)(1) of the Act, 21 U.S.C. section 360bbb-3(b)(1), unless the authorization is terminated or revoked.  Performed at [facility], [address] [place], [place] [address]      Time coordinating discharge:  I have spent 35 minutes face to face with the patient and on the ward discussing the patients care, assessment, plan and disposition with other care givers. >50% of the time was devoted counseling the patient about the risks and benefits of treatment/Discharge disposition and coordinating care.   SIGNED:   Janey Genta  Zoila Shutter,  MD  [facility] 03/08/2024, 10:45 AM   If 7PM-7AM, please contact night-coverage

## 2024-03-12 ENCOUNTER — Other Ambulatory Visit (INDEPENDENT_AMBULATORY_CARE_PROVIDER_SITE_OTHER): Payer: Self-pay

## 2024-03-12 ENCOUNTER — Ambulatory Visit: Payer: BLUE CROSS/BLUE SHIELD | Admitting: Orthopaedic Surgery

## 2024-03-12 VITALS — BP 121/82 | HR 113 | Temp 97.7°F

## 2024-03-12 DIAGNOSIS — Z96642 Presence of left artificial hip joint: Secondary | ICD-10-CM

## 2024-03-12 NOTE — Progress Notes (Signed)
 xr

## 2024-03-12 NOTE — Progress Notes (Signed)
 The patient is well-known to me.  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  This was secondary to avascular necrosis.  She has not had any issues until recently she developed some pain over the lateral aspect of her left hip.  It is not every day and is not hurting significantly.  She walks with a normal-appearing gait.  Both hips move smoothly and fluidly with no blocks or rotation and no pain on exam today other than just a small amount of pain over the trochanteric area on the left side to palpation.  Standing and pelvis shows bilateral total hip arthroplasties with no complicating features.  There is no evidence of loosening.  She is doing well and I have recommended no intervention other than some stretching or occasional Voltaren gel.  If things worsen we could always provide a steroid injection over this lateral aspect of her hip if she has issues.  Right now she is doing well and was given reassurance and she is pleased overall.  Follow-up can be as needed.

## 2024-03-14 DIAGNOSIS — I10 Essential (primary) hypertension: Secondary | ICD-10-CM | POA: Diagnosis not present

## 2024-03-14 DIAGNOSIS — G894 Chronic pain syndrome: Secondary | ICD-10-CM | POA: Diagnosis not present

## 2024-03-14 DIAGNOSIS — Z87891 Personal history of nicotine dependence: Secondary | ICD-10-CM | POA: Diagnosis not present

## 2024-03-14 DIAGNOSIS — I739 Peripheral vascular disease, unspecified: Secondary | ICD-10-CM | POA: Diagnosis not present

## 2024-03-14 DIAGNOSIS — R739 Hyperglycemia, unspecified: Secondary | ICD-10-CM | POA: Diagnosis not present

## 2024-03-14 DIAGNOSIS — Z9981 Dependence on supplemental oxygen: Secondary | ICD-10-CM | POA: Diagnosis not present

## 2024-03-14 DIAGNOSIS — J449 Chronic obstructive pulmonary disease, unspecified: Secondary | ICD-10-CM | POA: Diagnosis not present

## 2024-03-14 DIAGNOSIS — J101 Influenza due to other identified influenza virus with other respiratory manifestations: Secondary | ICD-10-CM | POA: Diagnosis not present

## 2024-03-14 DIAGNOSIS — I2510 Atherosclerotic heart disease of native coronary artery without angina pectoris: Secondary | ICD-10-CM | POA: Diagnosis not present

## 2024-03-14 DIAGNOSIS — J9601 Acute respiratory failure with hypoxia: Secondary | ICD-10-CM | POA: Diagnosis not present

## 2024-03-14 DIAGNOSIS — F325 Major depressive disorder, single episode, in full remission: Secondary | ICD-10-CM | POA: Diagnosis not present

## 2024-03-17 ENCOUNTER — Other Ambulatory Visit: Payer: Self-pay | Admitting: Cardiology

## 2024-03-17 DIAGNOSIS — E782 Mixed hyperlipidemia: Secondary | ICD-10-CM | POA: Diagnosis not present

## 2024-03-17 DIAGNOSIS — I2510 Atherosclerotic heart disease of native coronary artery without angina pectoris: Secondary | ICD-10-CM | POA: Diagnosis not present

## 2024-03-17 DIAGNOSIS — I10 Essential (primary) hypertension: Secondary | ICD-10-CM | POA: Diagnosis not present

## 2024-03-17 DIAGNOSIS — K2100 Gastro-esophageal reflux disease with esophagitis, without bleeding: Secondary | ICD-10-CM | POA: Diagnosis not present

## 2024-03-21 DIAGNOSIS — F411 Generalized anxiety disorder: Secondary | ICD-10-CM | POA: Diagnosis not present

## 2024-03-21 DIAGNOSIS — Z79899 Other long term (current) drug therapy: Secondary | ICD-10-CM | POA: Diagnosis not present

## 2024-03-21 DIAGNOSIS — F3341 Major depressive disorder, recurrent, in partial remission: Secondary | ICD-10-CM | POA: Diagnosis not present

## 2024-03-25 DIAGNOSIS — M545 Low back pain, unspecified: Secondary | ICD-10-CM | POA: Diagnosis not present

## 2024-03-25 DIAGNOSIS — G894 Chronic pain syndrome: Secondary | ICD-10-CM | POA: Diagnosis not present

## 2024-03-25 DIAGNOSIS — Z79891 Long term (current) use of opiate analgesic: Secondary | ICD-10-CM | POA: Diagnosis not present

## 2024-03-25 DIAGNOSIS — M25551 Pain in right hip: Secondary | ICD-10-CM | POA: Diagnosis not present

## 2024-03-25 DIAGNOSIS — M5116 Intervertebral disc disorders with radiculopathy, lumbar region: Secondary | ICD-10-CM | POA: Diagnosis not present

## 2024-04-15 DIAGNOSIS — Z23 Encounter for immunization: Secondary | ICD-10-CM | POA: Diagnosis not present

## 2024-04-29 DIAGNOSIS — G4734 Idiopathic sleep related nonobstructive alveolar hypoventilation: Secondary | ICD-10-CM | POA: Diagnosis not present

## 2024-04-29 DIAGNOSIS — J432 Centrilobular emphysema: Secondary | ICD-10-CM | POA: Diagnosis not present

## 2024-05-06 DIAGNOSIS — J449 Chronic obstructive pulmonary disease, unspecified: Secondary | ICD-10-CM | POA: Diagnosis not present

## 2024-05-06 DIAGNOSIS — I77810 Thoracic aortic ectasia: Secondary | ICD-10-CM | POA: Diagnosis not present

## 2024-05-06 DIAGNOSIS — G729 Myopathy, unspecified: Secondary | ICD-10-CM | POA: Diagnosis not present

## 2024-05-06 DIAGNOSIS — M810 Age-related osteoporosis without current pathological fracture: Secondary | ICD-10-CM | POA: Diagnosis not present

## 2024-05-06 DIAGNOSIS — I10 Essential (primary) hypertension: Secondary | ICD-10-CM | POA: Diagnosis not present

## 2024-05-06 DIAGNOSIS — I739 Peripheral vascular disease, unspecified: Secondary | ICD-10-CM | POA: Diagnosis not present

## 2024-05-06 DIAGNOSIS — Z8679 Personal history of other diseases of the circulatory system: Secondary | ICD-10-CM | POA: Diagnosis not present

## 2024-05-06 DIAGNOSIS — E782 Mixed hyperlipidemia: Secondary | ICD-10-CM | POA: Diagnosis not present

## 2024-05-06 DIAGNOSIS — F331 Major depressive disorder, recurrent, moderate: Secondary | ICD-10-CM | POA: Diagnosis not present

## 2024-05-06 DIAGNOSIS — I719 Aortic aneurysm of unspecified site, without rupture: Secondary | ICD-10-CM | POA: Diagnosis not present

## 2024-05-06 DIAGNOSIS — J9611 Chronic respiratory failure with hypoxia: Secondary | ICD-10-CM | POA: Diagnosis not present

## 2024-05-06 DIAGNOSIS — I700 Atherosclerosis of aorta: Secondary | ICD-10-CM | POA: Diagnosis not present

## 2024-05-21 ENCOUNTER — Other Ambulatory Visit: Payer: Self-pay | Admitting: Cardiology

## 2024-05-29 DIAGNOSIS — Z87891 Personal history of nicotine dependence: Secondary | ICD-10-CM | POA: Diagnosis not present

## 2024-05-29 DIAGNOSIS — G894 Chronic pain syndrome: Secondary | ICD-10-CM | POA: Diagnosis not present

## 2024-05-29 DIAGNOSIS — M25552 Pain in left hip: Secondary | ICD-10-CM | POA: Diagnosis not present

## 2024-05-29 DIAGNOSIS — M810 Age-related osteoporosis without current pathological fracture: Secondary | ICD-10-CM | POA: Diagnosis not present

## 2024-05-29 DIAGNOSIS — I10 Essential (primary) hypertension: Secondary | ICD-10-CM | POA: Diagnosis not present

## 2024-05-29 DIAGNOSIS — Z9981 Dependence on supplemental oxygen: Secondary | ICD-10-CM | POA: Diagnosis not present

## 2024-05-30 ENCOUNTER — Ambulatory Visit: Payer: BLUE CROSS/BLUE SHIELD

## 2024-05-30 VITALS — BP 125/85 | HR 88 | Temp 98.3°F | Resp 20 | Ht 65.0 in | Wt 123.2 lb

## 2024-05-30 DIAGNOSIS — M810 Age-related osteoporosis without current pathological fracture: Secondary | ICD-10-CM

## 2024-05-30 DIAGNOSIS — F411 Generalized anxiety disorder: Secondary | ICD-10-CM | POA: Diagnosis not present

## 2024-05-30 DIAGNOSIS — F3341 Major depressive disorder, recurrent, in partial remission: Secondary | ICD-10-CM | POA: Diagnosis not present

## 2024-05-30 MED ORDER — DENOSUMAB 60 MG/ML ~~LOC~~ SOSY
60.0000 mg | PREFILLED_SYRINGE | Freq: Once | SUBCUTANEOUS | Status: AC
  Administered 2024-05-30: 60 mg via SUBCUTANEOUS
  Filled 2024-05-30: qty 1

## 2024-05-30 NOTE — Progress Notes (Signed)
 Diagnosis: Osteoporosis  Provider:  Mannam, Praveen MD  Procedure: Injection  Per Brenda Carney, corrected calcium  level for low albumin  is 9.4 mg/dL  and OK to administer Prolia  .  (Denosumab ), Dose: 60 mg, Site: subcutaneous, Number of injections: 1  Injection Site(s): Right arm  Post Care: Patient declined observation  Discharge: Condition: Good, Destination: Home . AVS Declined  Performed by:  Aydeen Blume, RN

## 2024-06-10 ENCOUNTER — Telehealth: Payer: Self-pay | Admitting: Cardiology

## 2024-06-10 DIAGNOSIS — G4734 Idiopathic sleep related nonobstructive alveolar hypoventilation: Secondary | ICD-10-CM | POA: Diagnosis not present

## 2024-06-10 DIAGNOSIS — J432 Centrilobular emphysema: Secondary | ICD-10-CM | POA: Diagnosis not present

## 2024-06-10 MED ORDER — BISOPROLOL FUMARATE 5 MG PO TABS: 5.0000 mg | ORAL_TABLET | Freq: Every day | ORAL | 0 refills | Status: DC

## 2024-06-10 NOTE — Telephone Encounter (Signed)
*  STAT* If patient is at the pharmacy, call can be transferred to refill team.   1. Which medications need to be refilled? (please list name of each medication and dose if known) bisoprolol  (ZEBETA ) 5 MG tablet    2. Would you like to learn more about the convenience, safety, & potential cost savings by using the [facility]?    3. Are you open to using the [facility] (Type [facility].  ).   4. Which pharmacy/location (including street and city if local pharmacy) is medication to be sent to? [facility] [place], [place] - [address]    5. Do they need a 30 day or 90 day supply? 90 day   Pt appt : 8/05

## 2024-06-10 NOTE — Telephone Encounter (Signed)
 Spoke to patient she stated she needs Bisoprolol  refill.Advised she is past due to see Dr.Harding.Appointment scheduled with Dr.Harding 6/25 at 3:20 pm.Refill sent to her pharmacy.

## 2024-06-18 ENCOUNTER — Ambulatory Visit: Attending: Cardiology | Admitting: Cardiology

## 2024-06-23 ENCOUNTER — Ambulatory Visit (INDEPENDENT_AMBULATORY_CARE_PROVIDER_SITE_OTHER): Admitting: Orthopaedic Surgery

## 2024-06-23 ENCOUNTER — Encounter: Payer: Self-pay | Admitting: Pulmonary Disease

## 2024-06-23 DIAGNOSIS — Z96642 Presence of left artificial hip joint: Secondary | ICD-10-CM

## 2024-06-23 NOTE — Progress Notes (Signed)
 The patient is a 73 year old who is now 4 years out from both of her hips being replaced.  She says she has good range of motion and strength.  We actually just saw her in March of this year and had x-rayed both hips and they were in good position with no complicating features.  She is never had an issue with them.  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  Both hips move smoothly and fluidly with no blocks or rotation.  She has good strength in both hips.  We talked about things to avoid and to be careful with her hips.  If she does have any issues she knows to let us  know.

## 2024-06-26 DIAGNOSIS — G4734 Idiopathic sleep related nonobstructive alveolar hypoventilation: Secondary | ICD-10-CM | POA: Diagnosis not present

## 2024-07-18 ENCOUNTER — Other Ambulatory Visit: Payer: Self-pay | Admitting: Cardiology

## 2024-07-23 DIAGNOSIS — M5412 Radiculopathy, cervical region: Secondary | ICD-10-CM | POA: Diagnosis not present

## 2024-07-23 DIAGNOSIS — G894 Chronic pain syndrome: Secondary | ICD-10-CM | POA: Diagnosis not present

## 2024-07-23 DIAGNOSIS — M25512 Pain in left shoulder: Secondary | ICD-10-CM | POA: Diagnosis not present

## 2024-07-23 DIAGNOSIS — Z79891 Long term (current) use of opiate analgesic: Secondary | ICD-10-CM | POA: Diagnosis not present

## 2024-07-23 DIAGNOSIS — M542 Cervicalgia: Secondary | ICD-10-CM | POA: Diagnosis not present

## 2024-07-23 DIAGNOSIS — M79673 Pain in unspecified foot: Secondary | ICD-10-CM | POA: Diagnosis not present

## 2024-07-25 DIAGNOSIS — G4734 Idiopathic sleep related nonobstructive alveolar hypoventilation: Secondary | ICD-10-CM | POA: Diagnosis not present

## 2024-07-29 ENCOUNTER — Telehealth: Payer: Self-pay | Admitting: Pharmacy Technician

## 2024-07-29 ENCOUNTER — Encounter: Payer: Self-pay | Admitting: Cardiology

## 2024-07-29 ENCOUNTER — Ambulatory Visit: Attending: Cardiovascular Disease | Admitting: Cardiology

## 2024-07-29 VITALS — BP 110/68 | HR 74 | Ht 65.0 in | Wt 128.2 lb

## 2024-07-29 DIAGNOSIS — J411 Mucopurulent chronic bronchitis: Secondary | ICD-10-CM

## 2024-07-29 DIAGNOSIS — Z79899 Other long term (current) drug therapy: Secondary | ICD-10-CM | POA: Insufficient documentation

## 2024-07-29 DIAGNOSIS — G720 Drug-induced myopathy: Secondary | ICD-10-CM

## 2024-07-29 DIAGNOSIS — I739 Peripheral vascular disease, unspecified: Secondary | ICD-10-CM | POA: Diagnosis not present

## 2024-07-29 DIAGNOSIS — Z9861 Coronary angioplasty status: Secondary | ICD-10-CM | POA: Diagnosis not present

## 2024-07-29 DIAGNOSIS — I77810 Thoracic aortic ectasia: Secondary | ICD-10-CM | POA: Diagnosis not present

## 2024-07-29 DIAGNOSIS — T466X5D Adverse effect of antihyperlipidemic and antiarteriosclerotic drugs, subsequent encounter: Secondary | ICD-10-CM | POA: Diagnosis not present

## 2024-07-29 DIAGNOSIS — I2510 Atherosclerotic heart disease of native coronary artery without angina pectoris: Secondary | ICD-10-CM | POA: Diagnosis not present

## 2024-07-29 DIAGNOSIS — F17210 Nicotine dependence, cigarettes, uncomplicated: Secondary | ICD-10-CM

## 2024-07-29 DIAGNOSIS — I10 Essential (primary) hypertension: Secondary | ICD-10-CM | POA: Diagnosis not present

## 2024-07-29 DIAGNOSIS — Z95828 Presence of other vascular implants and grafts: Secondary | ICD-10-CM

## 2024-07-29 DIAGNOSIS — E785 Hyperlipidemia, unspecified: Secondary | ICD-10-CM | POA: Diagnosis not present

## 2024-07-29 MED ORDER — REPATHA SURECLICK 140 MG/ML ~~LOC~~ SOAJ: 140.0000 mg | SUBCUTANEOUS | 3 refills | Status: DC

## 2024-07-29 NOTE — Patient Instructions (Signed)
 Medication Instructions:  No changes   Repatha  refilled  *If you need a refill on your cardiac medications before your next appointment, please call your pharmacy*   Lab Work: CBC CMP LIPIDS If you have labs (blood work) drawn today and your tests are completely normal, you will receive your results only by: MyChart Message (if you have MyChart) OR A paper copy in the mail If you have any lab test that is abnormal or we need to change your treatment, we will call you to review the results.   Testing/Procedures:  Not needed  Follow-Up: At [facility], you and your health needs are our priority.  As part of our continuing mission to provide you with exceptional heart care, we have created designated Provider Care Teams.  These Care Teams include your primary Cardiologist (physician) and Advanced Practice Providers (APPs -  Physician Assistants and Nurse Practitioners) who all work together to provide you with the care you need, when you need it.     Your next appointment:   12 month(s)  The format for your next appointment:   In Person  Provider:   Damien Braver, NP or Brenda West, NP      Then, Brenda Clay, MD will plan to see you again in 24 month(s).   Other Instructions

## 2024-07-29 NOTE — Telephone Encounter (Signed)
   Last labs are from 2023. Labs ordered today. Waiting on labs to post to send with the prior authorization since it is asking if she had clinical positive benefit

## 2024-07-29 NOTE — Progress Notes (Signed)
 Cardiology Office Note:  .   Date:  08/06/2024  ID:  Brenda Carney, DOB 10-06-1951, MRN [id number] PCP/Referring Provider: Stephane Leita DEL, MD  [facility] Providers Cardiologist:  Alm Clay, MD     Chief Complaint  Patient presents with   Follow-up    Delayed annual   Coronary Artery Disease    No active angina    Patient Profile: .     Brenda Carney is a 73 y.o. female former smoker with a PMH notable for CAD-PCI, PAD-PTCA, COPD, CRF's of HTN HLD who presents here for   CAD:=>/PCI (09/30/2021) severe 2V CAD -> tandem 70% &90% proximal RCA (Onyx Frontier DES 3.5 mm 30 mm -postdilated 4.1-3.7 mm) and 60% dist RCA,  sequential 45%, 80% and 50% LCx (Onyx Frontier DES 3.0 x 34 mm - 3.3 mm) PAD: s/p R Fem-BKPop bypass) -->  R SFA stent to improve flow and femoropopliteal bypass graft. => L CFA DEA, profundoplasty and bovine pericardial patch angioplasty; L SFA endarterectomy with bovine patch angioplasty.  Plan for PTCA of the L SFA.    I last saw Brenda Carney on December 04, 2022 as a follow-up after her peripheral vascular procedures.  She open well your PCI and is feeling better after her PAD treatment with less claudication.  She was still having left-sided claudication and had her upcoming left SFA PTCA.  She had also had bilateral hip surgery making sure that her her legs are definitely doing better.  She was even jogging some.  Denied any chest pain pressure or dyspnea.  No PND, apnea or edema.  Energy level better.  Was down to maybe 1 or 2 cigarettes a day and probably try to quit -but not able to fully quit.  No changes made.  Plan was routine follow-up  December 2023: Left SFA PTCA (shockwave lithotripsy)-I 6 mm x 40 mm Eluvia stent.  Postdilated with 5 mm balloon. October 25, 2023: Abdominal aortic angiogram with aortoiliac and bilateral LDA runoff.:  PTCA/stent of the right external iliac for 80% stenosis (7 mm x 40 mm drug-coated Eluvia stent)  Subjective   Discussed the use of AI scribe software for clinical note transcription with the patient, who gave verbal consent to proceed.  History of Present Illness  Brenda Carney [name] is a 73 year old female with coronary artery disease and peripheral artery disease who presents for a follow-up visit.  She reports improvement in her leg pain with no swelling, palpitations, or episodes of syncope. She is currently taking aspirin , Plavix , amlodipine , bisoprolol , and lisinopril  for her cardiovascular conditions.  She presents indicating that her energy level is better.  She is very active and denies any symptoms of chest tightness pressure pain with rest or exertion.  No PND, orthopnea or edema.  No melena, medic easy, materia epistaxis.  No syncope/near syncope.  No TIA/CVA or FUGAX.  Well-controlled claudication.  Her medication regimen also includes Protonix  for gastrointestinal issues, Breztri for her lungs, and an albuterol  inhaler for rescue use. She uses Lumify eye drops and takes Abilify  2 mg and Wellbutrin  150 mg most days for stress management. She occasionally uses Valium  for muscle spasms and Pristiq  100 mg in the morning, which she also uses to aid sleep. She mentions running out of Pristiq  and Repatha  recently.  Her cholesterol levels were last checked in November and were reported as excellent. She has not tolerated multiple statins in the past. She denies being diabetic and reports being  less active than before but still manages to care for twelve horses and four dogs without experiencing significant shortness of breath or chest pain.     Objective   Medications - Aspirin  81 mg; - Plavix  75 mg. - Amlodipine  10 mg daily; - Bisoprolol  5 mg daily; lisinopril  10 mg daily; prazosin  1 mg nightly - Repatha  140 mg injection every 14 days - Protonix  40 mg daily - Breztri Aerosphere 1 puff as needed; - Albuterol  inhaler as needed; -Albuterol  inhaler as needed - Lumify eye drops - Abilify  2 mg  every morning; - Wellbutrin  150 mg; - Pristiq  100 mg in the morning - 10 mg Valium  as needed sleep.  Iron supplementation 40.5 mg.  As needed Percocet 10-325 milligram for breakthrough pain.  Social History    Employment: Takes care of horses and dogs Living Situation: Lives in a place where she takes care of horses and dogs.  Family history: Denies any history of CAD  Studies Reviewed: SABRA   EKG Interpretation Date/Time:  Tuesday July 29 2024 10:04:51 EDT Ventricular Rate:  74 PR Interval:  182 QRS Duration:  70 QT Interval:  386 QTC Calculation: 428 R Axis:   59  Text Interpretation: Normal sinus rhythm Septal infarct , age undetermined When compared with ECG of 07-Mar-2024 15:24, Rate slower Otherwise no significant change Confirmed by Anner Lenis ([id number]) on 07/29/2024 10:36:26 AM    Labs from PCP-K PN: TC 142, TG 147, HDL 81, LDL 37; TSH 2.47 (88/02/7974) Lab Results  Component Value Date   NA 135 03/08/2024   K 3.7 03/08/2024   CREATININE 0.75 03/08/2024   GFRNONAA >60 03/08/2024   GLUCOSE 244 (H) 03/08/2024    CATH: 09/2021: Proximal LCx lesions (45, 80 and 50%): Onyx Frontier DES 3.0 x 34 mm - 3.3 mm.; Proximal RCA lesions (30, 70 and 90%): Onyx Frontier DES 3.5 x 30 postdilated from 4.1 down to 3.7 mm.. Residual distal RCA 60%. Mild diffuse LAD.  Diagnostic; Dominance: Right      Intervention     Risk Assessment/Calculations:          Physical Exam:   VS:  BP 110/68 (BP Location: Left Arm, Patient Position: Sitting)   Pulse 74   Ht 5' 5 (1.651 m)   Wt 128 lb 3.2 oz (58.2 kg)   SpO2 95%   BMI 21.33 kg/m    Wt Readings from Last 3 Encounters:  07/29/24 128 lb 3.2 oz (58.2 kg)  05/30/24 123 lb 3.2 oz (55.9 kg)  03/07/24 123 lb (55.8 kg)    GEN: Appearing.  Well nourished, well groomed in no acute distress; in good spirits. NECK: No JVD; No carotid bruits CARDIAC:  RRR with occasional ectopy; Normal S1, &S2; no murmurs, rubs, gallops RESPIRATORY:  Clear  to auscultation without rales, wheezing or rhonchi ; nonlabored, good air movement. ABDOMEN: Soft, non-tender, non-distended EXTREMITIES:  No edema; No deformity; diminished pedal pulses bilaterally.    ASSESSMENT AND PLAN: .    Problem List Items Addressed This Visit       Cardiology Problems   CAD S/P percutaneous coronary angioplasty - Primary (Chronic)   Overall seems to pretty well-controlled with no notable angina. No angina, heart failure, or claudication. Previous leg artery intervention improved symptoms. Active without shortness of breath or chest pain. - Order blood work: Automotive engineer, liver panel, blood counts to monitor aspirin  and Plavix  effects. - Follow-up with vascular surgeon Dr. Gretta for Doppler studies. - Encourage continued physical activity.  Relevant Medications   Evolocumab  (REPATHA  SURECLICK) 140 MG/ML SOAJ   Essential hypertension (Chronic)   Stable blood pressure on 10 mg amlodipine  and lisinopril  along with 5 mg of bisoprolol .  -Continue current meds      Relevant Medications   Evolocumab  (REPATHA  SURECLICK) 140 MG/ML SOAJ   Other Relevant Orders   EKG 12-Lead (Completed)   Lipid panel   Comprehensive metabolic panel with GFR   CBC   Hyperlipidemia with target LDL less than 70 (Chronic)   Cholesterol levels excellent as of November. On Repatha , effective despite occasional missed doses. Advised to maintain levels. - Refill Repatha  prescription. - Order lipid panel within a month to monitor cholesterol levels.      Relevant Medications   Evolocumab  (REPATHA  SURECLICK) 140 MG/ML SOAJ   PAD (peripheral artery disease) (HCC) (Chronic)   Extensive work as described above.  Doing well with no recurrent claudication.  Due for routine surveillance ABIs.  CRF management: Repatha  for lipids, ASA 81 mg/Plavix  75 mg DAPT- BP control with amlodipine , lisinopril  10 mg and bisoprolol  5 mg      Relevant Medications   Evolocumab  (REPATHA   SURECLICK) 140 MG/ML SOAJ   Other Relevant Orders   EKG 12-Lead (Completed)   Lipid panel   Comprehensive metabolic panel with GFR   CBC   Thoracic aortic ectasia (HCC) (Chronic)   Borderline thoracic aortic dilation.  Continue follow-up as she routinely has chest CTs.      Relevant Medications   Evolocumab  (REPATHA  SURECLICK) 140 MG/ML SOAJ     Other   Chronic obstructive pulmonary disease (HCC)   Managed with Breztri and albuterol . No shortness of breath during activities. - Continue Breztri and albuterol  as needed.-No standing medication.. Continues to talk about smoking cessation.  Tobacco: Current smoker. Patient is trying to quit smoking.      Hx of aorto-femoral bypass (Chronic)   Patient graph by PV angiogram.  Follow-up with [facility]      Light smoker (Chronic)   .Discussed importance and difficulty of quitting smoking. Emphasized long-term benefits. - Encourage smoking cessation efforts.      Statin myopathy (Chronic)   Intolerant of statins including rosuvastatin, atorvastatin  and simvastatin.  No longer on statin.  lipids well-controlled with Repatha .      Other Visit Diagnoses       Peripheral arterial disease (HCC)       Relevant Medications   Evolocumab  (REPATHA  SURECLICK) 140 MG/ML SOAJ   Other Relevant Orders   EKG 12-Lead (Completed)   Lipid panel   Comprehensive metabolic panel with GFR   CBC     Medication management       Relevant Orders   Lipid panel   Comprehensive metabolic panel with GFR   CBC               Follow-Up: No follow-ups on file.   Signed, Alm MICAEL Clay, MD, MS Alm Clay, M.D., M.S. Interventional Chartered certified accountant  Pager # [phone]

## 2024-08-01 NOTE — Telephone Encounter (Signed)
 Called and spoke to patient. Advised patient that she needed to have labs ordered at 07/29/2024 visit done in order to submit prior authorization renewal for repatha . Advised patient she can go to any [facility] location and does not need an appointment to have these labs done. Patient verbalized understanding and was agreeable to plan.

## 2024-08-01 NOTE — Telephone Encounter (Signed)
 Hi, we need updated labs to submit the prior authorization renewal for  repatha . Labs were ordered on 07/29/24 but doesn't appear they have been done yet. Last labs found was from 2023. Can she come in to have labs so we can submit the prior authorization? Thank you

## 2024-08-05 NOTE — Telephone Encounter (Addendum)
 No new lab results as of today. Will route to Dr. Genice primary nurse.

## 2024-08-06 ENCOUNTER — Encounter: Payer: Self-pay | Admitting: Cardiology

## 2024-08-06 DIAGNOSIS — F17210 Nicotine dependence, cigarettes, uncomplicated: Secondary | ICD-10-CM | POA: Insufficient documentation

## 2024-08-06 NOTE — Assessment & Plan Note (Signed)
 Patient graph by PV angiogram.  Follow-up with [facility]

## 2024-08-06 NOTE — Assessment & Plan Note (Signed)
 Stable blood pressure on 10 mg amlodipine  and lisinopril  along with 5 mg of bisoprolol .  -Continue current meds

## 2024-08-06 NOTE — Assessment & Plan Note (Signed)
.  Discussed importance and difficulty of quitting smoking. Emphasized long-term benefits. - Encourage smoking cessation efforts.

## 2024-08-06 NOTE — Assessment & Plan Note (Signed)
 Borderline thoracic aortic dilation.  Continue follow-up as she routinely has chest CTs.

## 2024-08-06 NOTE — Assessment & Plan Note (Signed)
 Intolerant of statins including rosuvastatin, atorvastatin  and simvastatin.  No longer on statin.  lipids well-controlled with Repatha .

## 2024-08-06 NOTE — Assessment & Plan Note (Signed)
 Cholesterol levels excellent as of November. On Repatha , effective despite occasional missed doses. Advised to maintain levels. - Refill Repatha  prescription. - Order lipid panel within a month to monitor cholesterol levels.

## 2024-08-06 NOTE — Assessment & Plan Note (Signed)
 Overall seems to pretty well-controlled with no notable angina. No angina, heart failure, or claudication. Previous leg artery intervention improved symptoms. Active without shortness of breath or chest pain. - Order blood work: Automotive engineer, liver panel, blood counts to monitor aspirin  and Plavix  effects. - Follow-up with vascular surgeon Dr. Gretta for Doppler studies. - Encourage continued physical activity.

## 2024-08-06 NOTE — Assessment & Plan Note (Signed)
 Extensive work as described above.  Doing well with no recurrent claudication.  Due for routine surveillance ABIs.  CRF management: Repatha  for lipids, ASA 81 mg/Plavix  75 mg DAPT- BP control with amlodipine , lisinopril  10 mg and bisoprolol  5 mg

## 2024-08-06 NOTE — Assessment & Plan Note (Signed)
 Managed with Breztri and albuterol . No shortness of breath during activities. - Continue Breztri and albuterol  as needed.-No standing medication.. Continues to talk about smoking cessation.  Tobacco: Current smoker. Patient is trying to quit smoking.

## 2024-08-07 ENCOUNTER — Encounter: Payer: Self-pay | Admitting: Pulmonary Disease

## 2024-08-07 ENCOUNTER — Other Ambulatory Visit (HOSPITAL_COMMUNITY)
Admission: AD | Admit: 2024-08-07 | Discharge: 2024-08-07 | Disposition: A | Discharge status: Home / Self Care | Source: Ambulatory Visit | Attending: Cardiology | Admitting: Cardiology

## 2024-08-11 ENCOUNTER — Encounter: Payer: Self-pay | Admitting: Pulmonary Disease

## 2024-08-13 ENCOUNTER — Encounter: Payer: Self-pay | Admitting: Pulmonary Disease

## 2024-08-13 ENCOUNTER — Other Ambulatory Visit: Payer: Self-pay | Admitting: Cardiology

## 2024-08-15 MED ORDER — BISOPROLOL FUMARATE 5 MG PO TABS: 5.0000 mg | ORAL_TABLET | Freq: Every day | ORAL | 3 refills | Status: AC

## 2024-08-19 DIAGNOSIS — M79673 Pain in unspecified foot: Secondary | ICD-10-CM | POA: Diagnosis not present

## 2024-08-19 DIAGNOSIS — M25512 Pain in left shoulder: Secondary | ICD-10-CM | POA: Diagnosis not present

## 2024-08-19 DIAGNOSIS — M5412 Radiculopathy, cervical region: Secondary | ICD-10-CM | POA: Diagnosis not present

## 2024-09-15 DIAGNOSIS — F411 Generalized anxiety disorder: Secondary | ICD-10-CM | POA: Diagnosis not present

## 2024-09-15 DIAGNOSIS — F3341 Major depressive disorder, recurrent, in partial remission: Secondary | ICD-10-CM | POA: Diagnosis not present

## 2024-09-16 DIAGNOSIS — M961 Postlaminectomy syndrome, not elsewhere classified: Secondary | ICD-10-CM | POA: Diagnosis not present

## 2024-09-16 DIAGNOSIS — M25512 Pain in left shoulder: Secondary | ICD-10-CM | POA: Diagnosis not present

## 2024-09-16 DIAGNOSIS — G894 Chronic pain syndrome: Secondary | ICD-10-CM | POA: Diagnosis not present

## 2024-09-16 DIAGNOSIS — M542 Cervicalgia: Secondary | ICD-10-CM | POA: Diagnosis not present

## 2024-09-16 DIAGNOSIS — Z79891 Long term (current) use of opiate analgesic: Secondary | ICD-10-CM | POA: Diagnosis not present

## 2024-10-13 ENCOUNTER — Ambulatory Visit (HOSPITAL_COMMUNITY)
Admission: RE | Admit: 2024-10-13 | Discharge: 2024-10-13 | Disposition: A | Source: Ambulatory Visit | Attending: Surgery | Admitting: Surgery

## 2024-10-13 ENCOUNTER — Telehealth: Payer: Self-pay

## 2024-10-13 ENCOUNTER — Ambulatory Visit (HOSPITAL_COMMUNITY)
Admission: RE | Admit: 2024-10-13 | Discharge: 2024-10-13 | Disposition: A | Discharge status: Home / Self Care | Source: Ambulatory Visit | Attending: Surgery | Admitting: Surgery

## 2024-10-13 ENCOUNTER — Other Ambulatory Visit: Payer: Self-pay

## 2024-10-13 DIAGNOSIS — I739 Peripheral vascular disease, unspecified: Secondary | ICD-10-CM

## 2024-10-13 LAB — VAS US ABI WITH/WO TBI
Left ABI: 0.56
Right ABI: 0.98

## 2024-10-13 NOTE — Telephone Encounter (Signed)
 Patient left message on Triage line stating she has an appt this morning for an Aorto/Iliac US  and ABI. Patient reported her foot is turning dark.

## 2024-10-14 DIAGNOSIS — M25512 Pain in left shoulder: Secondary | ICD-10-CM | POA: Diagnosis not present

## 2024-10-14 DIAGNOSIS — M79673 Pain in unspecified foot: Secondary | ICD-10-CM | POA: Diagnosis not present

## 2024-10-14 DIAGNOSIS — G894 Chronic pain syndrome: Secondary | ICD-10-CM | POA: Diagnosis not present

## 2024-10-14 DIAGNOSIS — M5412 Radiculopathy, cervical region: Secondary | ICD-10-CM | POA: Diagnosis not present

## 2024-10-14 DIAGNOSIS — Z79891 Long term (current) use of opiate analgesic: Secondary | ICD-10-CM | POA: Diagnosis not present

## 2024-10-21 ENCOUNTER — Other Ambulatory Visit: Payer: Self-pay

## 2024-10-21 ENCOUNTER — Telehealth: Payer: Self-pay

## 2024-10-21 DIAGNOSIS — I739 Peripheral vascular disease, unspecified: Secondary | ICD-10-CM

## 2024-10-21 NOTE — Telephone Encounter (Signed)
 The patient called to report increased claudication in her right leg. PA McKenzi Schuh reviewed her ABI from last week and ordered LE Arterial duplex to be done 10/22/24 at 2pm and a PA visit at 3pm.  These appointments were communicated to the patient and she verbalized understanding of date, time and location.

## 2024-10-22 ENCOUNTER — Ambulatory Visit (HOSPITAL_COMMUNITY)
Admission: RE | Admit: 2024-10-22 | Discharge: 2024-10-22 | Disposition: A | Discharge status: Home / Self Care | Source: Ambulatory Visit | Attending: Vascular Surgery | Admitting: Vascular Surgery

## 2024-10-22 ENCOUNTER — Other Ambulatory Visit: Payer: Self-pay

## 2024-10-22 ENCOUNTER — Ambulatory Visit

## 2024-10-22 VITALS — BP 131/88 | HR 102 | Temp 98.1°F

## 2024-10-22 DIAGNOSIS — I771 Stricture of artery: Secondary | ICD-10-CM

## 2024-10-22 DIAGNOSIS — F17200 Nicotine dependence, unspecified, uncomplicated: Secondary | ICD-10-CM | POA: Diagnosis not present

## 2024-10-22 DIAGNOSIS — I739 Peripheral vascular disease, unspecified: Secondary | ICD-10-CM | POA: Insufficient documentation

## 2024-10-22 NOTE — Telephone Encounter (Signed)
 Correction of 10/21/24 note. Left leg not right.

## 2024-10-22 NOTE — Progress Notes (Signed)
 HISTORY AND PHYSICAL     CC:  follow up. Requesting Provider:  Stephane Leita DEL, MD  HPI: This is a 73 y.o. female who is here today for follow up for PAD.  Pt has hx of right above knee to below knee popliteal bypass with GSV by Dr. Laurence in 2018.  She underwent right SFA angioplasty and stent placement x 2 on 08/31/2022 for recurrent high-grade stenosis in the SFA proximal to her bypass by Dr. Gretta. She then underwent left common femoral and SFA endarterectomy on 09/11/2022 by Dr. Gretta for lifestyle limiting claudication.  She underwent left SFA lithotripsy and stenting on 12/07/2022 by Dr. Gretta to treat continued left lower extremity intermittent claudication.  On 10/25/2023, she had angiogram with angioplasty and stent of right EIA by Dr. Gretta.   Pt has not been since since her last procedure but did have studies last week.    The pt returns today for follow up.  She states that she does not have any trouble with her right leg but she has had increasing pain in the left leg over the past few weeks and it is getting worse.   She gets short distance claudication.  She denies pain in her foot or non healing wounds.  She states that her left foot turns red and white.  She continues to smoke but has tried to quit and continues to work on this.  She does have oxygen  at home when she needs it.    The pt is on a statin for cholesterol management.    The pt is on an aspirin .    Other AC:  Plavix  The pt is on CCB, BB, ACEI for hypertension.  The pt is not on diabetic medication. Tobacco hx:  current  Pt does not have family hx of AAA.  She states that her father had bilateral amputations due to circulation issues.   Past Medical History:  Diagnosis Date   Arthritis    thumbs; hips (05/14/2017)   CAD S/P 2 V PCI (pRCA, PCx-OM)    Coronary calcium  scoring: June 2018: Score 1106. 98 percentile for age -> LOW RISK MYOVIEW    Cerebral aneurysm    Right ophthalmic artery, left callosal marginal  anterior cerebral artery branch   Cerebrovascular disease    Carotid dopplers which showed no significant increase in velocities, extremely minimal on the left, antegrade vertebral bilaterally.   Cervical spondylosis    Chronic lower back pain    COPD (chronic obstructive pulmonary disease) (HCC)    little bit (05/14/2017)   Depression    Exercise-induced asthma with acute exacerbation    only in very hot weather (05/14/2017)   GERD (gastroesophageal reflux disease)    History of IBS    resovlved   Hx of multiple concussions    from horse training and riding   Hyperlipidemia with target LDL less than 70    Mostly statin intolerant,. Currently on Livalo  4 mg   Hypertension, benign    Lumbosacral spondylosis    PAD (peripheral artery disease)    Status post right above-the-knee-below the knee popliteal artery bypass; postop right ABI 0.96.   Pneumonia 2020   Sleep apnea    does not wear CPAP; think it was medication related (05/14/2017)   Stroke [facility]) 2010   TIA   Thoracic aortic ectasia    ~40 mm on Coronary Calcium  Score CT - recommend f/u CTA or MRA   TIA (transient ischemic attack) 09/2011   several  Past Surgical History:  Procedure Laterality Date   ABDOMINAL AORTOGRAM W/LOWER EXTREMITY N/A 05/14/2017   Procedure: Abdominal Aortogram w/Lower Extremity;  Surgeon: Laurence Redell CROME, MD;  Location: [facility] INVASIVE CV LAB;  Service: Cardiovascular;  Laterality: N/A;   ABDOMINAL AORTOGRAM W/LOWER EXTREMITY N/A 08/31/2022   Procedure: ABDOMINAL AORTOGRAM W/LOWER EXTREMITY;  Surgeon: Gretta Lonni PARAS, MD;  Location: [facility] INVASIVE CV LAB;  Service: Cardiovascular;  Laterality: N/A;   ABDOMINAL AORTOGRAM W/LOWER EXTREMITY N/A 12/07/2022   Procedure: ABDOMINAL AORTOGRAM W/LOWER EXTREMITY;  Surgeon: Gretta Lonni PARAS, MD;  Location: [facility] INVASIVE CV LAB;  Service: Cardiovascular;  Laterality: N/A;   ABDOMINAL AORTOGRAM W/LOWER EXTREMITY Right 10/25/2023   Procedure: ABDOMINAL  AORTOGRAM W/LOWER EXTREMITY;  Surgeon: Gretta Lonni PARAS, MD;  Location: [facility] INVASIVE CV LAB;  Service: Cardiovascular;  Laterality: Right;   ACROMIO-CLAVICULAR JOINT REPAIR Left 1990s   I think it was called an [facility] joint removal   BACK SURGERY     BYPASS GRAFT POPLITEAL TO POPLITEAL Right 05/15/2017   Procedure: BYPASS GRAFT ABOVE KNEE POPLITEAL TO BELOW KNEE POPLITEAL ARTERY;  Surgeon: Laurence Redell CROME, MD;  Location: [facility] OR;  Service: Vascular;  Laterality: Right;   CARDIAC CATHETERIZATION     CORONARY STENT INTERVENTION N/A 09/30/2021   Procedure: CORONARY STENT INTERVENTION;  Surgeon: Anner Alm ORN, MD;  Location: [facility] INVASIVE CV LAB;  Service: Cardiovascular;  Laterality: N/A;   CORONARY ULTRASOUND/IVUS N/A 09/30/2021   Procedure: Intravascular Ultrasound/IVUS;  Surgeon: Anner Alm ORN, MD;  Location: [facility] INVASIVE CV LAB;  Service: Cardiovascular;  Laterality: N/A;   ENDARTERECTOMY FEMORAL Left 09/11/2022   Procedure: LEFT COMMON FEMORAL ENDARTERECTOMY;  Surgeon: Gretta Lonni PARAS, MD;  Location: [facility] OR;  Service: Vascular;  Laterality: Left;   ESOPHAGOGASTRODUODENOSCOPY (EGD) WITH PROPOFOL  N/A 04/29/2018   Procedure: ESOPHAGOGASTRODUODENOSCOPY (EGD) WITH PROPOFOL ;  Surgeon: Elicia Claw, MD;  Location: [facility] ENDOSCOPY;  Service: Gastroenterology;  Laterality: N/A;   FOOT FRACTURE SURGERY Bilateral    multiple procedures   FRACTURE SURGERY     JOINT REPLACEMENT     LEFT HEART CATH AND CORONARY ANGIOGRAPHY N/A 09/30/2021   Procedure: LEFT HEART CATH AND CORONARY ANGIOGRAPHY;  Surgeon: Anner Alm ORN, MD;  Location: [facility] INVASIVE CV LAB;  Service: Cardiovascular;  Laterality: N/A;   LOWER EXTREMITY ANGIOGRAPHY N/A 06/16/2020   Procedure: LOWER EXTREMITY ANGIOGRAPHY;  Surgeon: Gretta Lonni PARAS, MD;  Location: [facility] INVASIVE CV LAB;  Service: Cardiovascular;  Laterality: N/A;   MAXIMUM ACCESS (MAS)POSTERIOR LUMBAR INTERBODY FUSION (PLIF) 1 LEVEL N/A 09/30/2014   Procedure: FOR MAXIMUM  ACCESS (MAS) POSTERIOR LUMBAR INTERBODY FUSION (PLIF) 1 LEVEL;  Surgeon: Alm GORMAN Molt, MD;  Location: [facility] NEURO ORS;  Service: Neurosurgery;  Laterality: N/A;  FOR MAXIMUM ACCESS (MAS) POSTERIOR LUMBAR INTERBODY FUSION (PLIF) 1 LEVEL LUMBAR 4-5   MULTIPLE TOOTH EXTRACTIONS Right 04/2017   NM MYOVIEW  LTD  06/2017    Normal EF, 66%. No ST changes. Normal study. LOW RISK.   PATCH ANGIOPLASTY Left 09/11/2022   Procedure: LEFT FEMORAL PATCH ANGIOPLASTY USING XENOSURE BIOLOGIC PATCH 1CMX14CM;  Surgeon: Gretta Lonni PARAS, MD;  Location: [facility] OR;  Service: Vascular;  Laterality: Left;   PERIPHERAL VASCULAR ATHERECTOMY Left 06/16/2020   Procedure: PERIPHERAL VASCULAR ATHERECTOMY;  Surgeon: Gretta Lonni PARAS, MD;  Location: [facility] INVASIVE CV LAB;  Service: Cardiovascular;  Laterality: Left;  SFA   PERIPHERAL VASCULAR ATHERECTOMY Left 12/07/2022   Procedure: PERIPHERAL VASCULAR ATHERECTOMY;  Surgeon: Gretta Lonni PARAS, MD;  Location: [facility] INVASIVE CV LAB;  Service: Cardiovascular;  Laterality: Left;   PERIPHERAL VASCULAR INTERVENTION Right 08/31/2022   Procedure: PERIPHERAL VASCULAR INTERVENTION;  Surgeon: Gretta Lonni PARAS, MD;  Location: [facility] INVASIVE CV LAB;  Service: Cardiovascular;  Laterality: Right;   PERIPHERAL VASCULAR INTERVENTION  12/07/2022   Procedure: PERIPHERAL VASCULAR INTERVENTION;  Surgeon: Gretta Lonni PARAS, MD;  Location: [facility] INVASIVE CV LAB;  Service: Cardiovascular;;   PERIPHERAL VASCULAR INTERVENTION Right 10/25/2023   Procedure: PERIPHERAL VASCULAR INTERVENTION;  Surgeon: Gretta Lonni PARAS, MD;  Location: [facility] INVASIVE CV LAB;  Service: Cardiovascular;  Laterality: Right;  right external iliac   TOTAL HIP ARTHROPLASTY Right 01/16/2020   Procedure: RIGHT TOTAL HIP ARTHROPLASTY ANTERIOR APPROACH;  Surgeon: Vernetta Lonni GRADE, MD;  Location: [facility] ORS;  Service: Orthopedics;  Laterality: Right;   TOTAL HIP ARTHROPLASTY Left 04/30/2020   Procedure: LEFT TOTAL HIP ARTHROPLASTY  ANTERIOR APPROACH;  Surgeon: Vernetta Lonni GRADE, MD;  Location: [facility] ORS;  Service: Orthopedics;  Laterality: Left;   TRANSTHORACIC ECHOCARDIOGRAM  09/07/2013   Done to evaluate TIA: Normal LV size and function. EF 55-60%. GR 1 DD. Mild left atrial dilation. Mildly calcified mitral leaflets. Otherwise normal.    Allergies  Allergen Reactions   Penicillins Swelling and Other (See Comments)    TONGUE SWELLING    Nucynta Er [Tapentadol Hcl Er] Other (See Comments)    Dry mouth   Statins     muscle pain   Lyrica [Pregabalin] Other (See Comments)    DRY MOUTH    Current Outpatient Medications  Medication Sig Dispense Refill   albuterol  (VENTOLIN  HFA) 108 (90 Base) MCG/ACT inhaler Inhale 2 puffs into the lungs every 6 (six) hours as needed for shortness of breath. 18 g 0   amLODipine  (NORVASC ) 10 MG tablet Take 10 mg by mouth in the morning.     ARIPiprazole  (ABILIFY ) 2 MG tablet Take 2 mg by mouth every morning.     aspirin  EC 81 MG tablet Take 81 mg by mouth in the morning.     bisoprolol  (ZEBETA ) 5 MG tablet Take 1 tablet (5 mg total) by mouth daily. 90 tablet 3   Brimonidine Tartrate (LUMIFY) 0.025 % SOLN Place 1 drop into both eyes daily as needed (redness).     Budeson-Glycopyrrol-Formoterol  (BREZTRI AEROSPHERE) 160-9-4.8 MCG/ACT AERO Inhale 1 puff into the lungs daily as needed (shortness of breath).     buPROPion  (WELLBUTRIN  XL) 150 MG 24 hr tablet Take 150 mg by mouth every morning.     clopidogrel  (PLAVIX ) 75 MG tablet TAKE ONE TABLET BY MOUTH ONCE DAILY 90 tablet 3   desvenlafaxine  (PRISTIQ ) 100 MG 24 hr tablet Take 100 mg by mouth in the morning.     diazepam  (VALIUM ) 10 MG tablet Take 10 mg by mouth at bedtime as needed for sleep or anxiety.     Evolocumab  (REPATHA  SURECLICK) 140 MG/ML SOAJ Inject 140 mg as directed every 14 (fourteen) days. 6 mL 3   Ferrous Sulfate  (IRON HIGH-POTENCY) 45 MG TBCR Take 45 mg by mouth daily.     lisinopril  (ZESTRIL ) 10 MG tablet Take 10 mg by  mouth in the morning.     Multiple Vitamins-Minerals (CENTRUM SILVER 50+WOMEN) TABS Take 1 tablet by mouth daily.     naphazoline-pheniramine (VISINE) 0.025-0.3 % ophthalmic solution Place 1 drop into both eyes 4 (four) times daily as needed (dry/irritated eyes.).     oxyCODONE -acetaminophen  (PERCOCET) 10-325 MG tablet Take 1 tablet by mouth every 6 (six) hours as needed for pain.     pantoprazole  (PROTONIX ) 40  MG tablet Take 40 mg by mouth 2 (two) times daily as needed (acid reflux).     prazosin  (MINIPRESS ) 1 MG capsule Take 1 mg by mouth at bedtime.     Propylhexedrine (BENZEDREX NA) Place 1 spray into the nose 2 (two) times daily as needed (nasal congestion.).     traZODone  (DESYREL ) 50 MG tablet Take 50 mg by mouth at bedtime.     No current facility-administered medications for this visit.    Family History  Problem Relation Age of Onset   Stroke Father    Heart attack Mother    Stroke Mother     Social History   Socioeconomic History   Marital status: Widowed    Spouse name: Vinie    Number of children: 0   Years of education: COLLEGE   Highest education level: Some college, no degree  Occupational History   Occupation: Physiological Scientist   Tobacco Use   Smoking status: Some Days    Current packs/day: 0.00    Types: Cigarettes    Passive exposure: Never   Smokeless tobacco: Never   Tobacco comments:    [name] says she smokes one cigarette every couple of weeks. [name] says she has a lot of friends who still smoke  Vaping Use   Vaping status: Former  Substance and Sexual Activity   Alcohol  use: Yes    Alcohol /week: 2.0 standard drinks of alcohol     Types: 2 Glasses of wine per week    Comment: wine   Drug use: No   Sexual activity: Not Currently    Birth control/protection: Post-menopausal  Other Topics Concern   Not on file  Social History Narrative   Patient lives Alone is a Widow Patient has no children.    Patient is a physiological scientist.    Patient is right handed.     Patient has BA degree.    Smokes 1 cigarette every couple of weeks.   Social Drivers of Corporate Investment Banker Strain: Not on file  Food Insecurity: Low Risk  (06/10/2024)   Received from [facility]   Hunger Vital Sign    Within the past 12 months, you worried that your food would run out before you got money to buy more: Never true    Within the past 12 months, the food you bought just didn't last and you didn't have money to get more. : Never true  Transportation Needs: No Transportation Needs (06/10/2024)   Received from [facility]    In the past 12 months, has lack of reliable transportation kept you from medical appointments, meetings, work or from getting things needed for daily living? : No  Physical Activity: Not on file  Stress: Not on file  Social Connections: Moderately Isolated (03/07/2024)   Social Connection and Isolation Panel    Frequency of Communication with Friends and Family: More than three times a week    Frequency of Social Gatherings with Friends and Family: Twice a week    Attends Religious Services: Never    Database Administrator or Organizations: Yes    Attends Engineer, Structural: More than 4 times per year    Marital Status: Widowed  Intimate Partner Violence: Not At Risk (03/07/2024)   Humiliation, Afraid, Rape, and Kick questionnaire    Fear of Current or Ex-Partner: No    Emotionally Abused: No    Physically Abused: No    Sexually Abused: No  REVIEW OF SYSTEMS:   [X]  denotes positive finding, [ ]  denotes negative finding Cardiac  Comments:  Chest pain or chest pressure:    Shortness of breath upon exertion:    Short of breath when lying flat:    Irregular heart rhythm:        Vascular    Pain in calf, thigh, or hip brought on by ambulation:    Pain in feet at night that wakes you up from your sleep:     Blood clot in your veins:    Leg swelling:         Pulmonary    Oxygen  at home: x available   Productive cough:     Wheezing:         Neurologic    Sudden weakness in arms or legs:     Sudden numbness in arms or legs:     Sudden onset of difficulty speaking or slurred speech:    Temporary loss of vision in one eye:     Problems with dizziness:         Gastrointestinal    Blood in stool:     Vomited blood:         Genitourinary    Burning when urinating:     Blood in urine:        Psychiatric    Major depression:         Hematologic    Bleeding problems:    Problems with blood clotting too easily:        Skin    Rashes or ulcers:        Constitutional    Fever or chills:      PHYSICAL EXAMINATION:  Today's Vitals   10/22/24 1458  BP: 131/88  Pulse: (!) 102  Temp: 98.1 F (36.7 C)  TempSrc: Temporal  PainSc: 6   PainLoc: Leg   There is no height or weight on file to calculate BMI.   General:  WDWN in NAD; vital signs documented above Gait: Not observed HENT: WNL, normocephalic Pulmonary: normal non-labored breathing , without wheezing Cardiac: regular HR, without carotid bruits Abdomen: soft, NT; aortic pulse is not palpable Skin: without rashes Vascular Exam/Pulses:  Right Left  Radial 2+ (normal) 2+ (normal)  Femoral 2+ (normal) 2+ (normal)  DP 1+ (weak) monophasic  PT 2+ (normal) monophasic   Extremities: bilateral feet without wounds.  Left foot is ruborous  Musculoskeletal: no muscle wasting or atrophy  Neurologic: A&O X 3 Psychiatric:  The pt has Normal affect.   Non-Invasive Vascular Imaging:   Vascular duplex on 10/22/2024: +----------+--------+-----+--------+----------+--------+  LEFT     PSV cm/sRatioStenosisWaveform  Comments  +----------+--------+-----+--------+----------+--------+  EIA Mid   347          50-99%  biphasic            +----------+--------+-----+--------+----------+--------+  CFA Distal15                   biphasic            +----------+--------+-----+--------+----------+--------+  DFA       132                  biphasic            +----------+--------+-----+--------+----------+--------+  SFA Prox  15                   biphasic            +----------+--------+-----+--------+----------+--------+  SFA  Mid   24                   monophasic          +----------+--------+-----+--------+----------+--------+  SFA Distal24                   monophasic          +----------+--------+-----+--------+----------+--------+  POP Prox  30                   monophasic          +----------+--------+-----+--------+----------+--------+  POP Distal17                   monophasic          +----------+--------+-----+--------+----------+--------+  ATA Distal20                   monophasic          +----------+--------+-----+--------+----------+--------+  PTA Distal16                   monophasic          +----------+--------+-----+--------+----------+--------+   Summary:  Left: Heavily calcified superficial femoral artery with low velocities throughout.  External iliac artery velocity of 347 cm/s suggestive of a 50-99% stenosis.   Previous ABI's/TBI's on 10/13/2024: Right:  0.98/0.30 - Great toe pressure: 40 Left:  0.56/0.20 - Great toe pressure:  26  Previous arterial duplex on 10/13/2024: Abdominal Aorta Findings:  +-------------+-------+----------+----------+--------+--------+--------+  Location    AP (cm)Trans (cm)PSV (cm/s)WaveformThrombusComments  +-------------+-------+----------+----------+--------+--------+--------+  Proximal    2.70   2.50      38                                  +-------------+-------+----------+----------+--------+--------+--------+  Mid         2.30   3.10      55                        fusiform  +-------------+-------+----------+----------+--------+--------+--------+  Distal      1.80   2.10      51                                   +-------------+-------+----------+----------+--------+--------+--------+  RT CIA Prox  0.8    0.8       160                                 +-------------+-------+----------+----------+--------+--------+--------+  RT CIA Mid                    128                                 +-------------+-------+----------+----------+--------+--------+--------+  RT CIA Distal                 128                                 +-------------+-------+----------+----------+--------+--------+--------+  RT EIA Mid   1.0    0.9       79                                  +-------------+-------+----------+----------+--------+--------+--------+  RT EIA Distal                 145                                 +-------------+-------+----------+----------+--------+--------+--------+  LT CIA Prox  1.0    0.8       79                                  +-------------+-------+----------+----------+--------+--------+--------+  LT CIA Mid                    84                                  +-------------+-------+----------+----------+--------+--------+--------+  LT CIA Distal                 57                                  +-------------+-------+----------+----------+--------+--------+--------+  LT EIA Prox                   260                                 +-------------+-------+----------+----------+--------+--------+--------+  LT EIA Mid   1.2    1.5       186                                 +-------------+-------+----------+----------+--------+--------+--------+  LT EIA Distal                 25                                  +-------------+-------+----------+----------+--------+--------+--------+     ASSESSMENT/PLAN:: 73 y.o. female here for follow up for PAD with hx of  right above knee to below knee popliteal bypass with GSV by Dr. Laurence in 2018.  She underwent right SFA angioplasty and stent placement x 2 on  08/31/2022 for recurrent high-grade stenosis in the SFA proximal to her bypass by Dr. Gretta. She then underwent left common femoral and SFA endarterectomy on 09/11/2022 by Dr. Gretta for lifestyle limiting claudication.  Most recently she required left SFA lithotripsy and stenting on 12/07/2022 by Dr. Gretta to treat continued left lower extremity intermittent claudication.  On 10/25/2023, she had angiogram with angioplasty and stent of right EIA by Dr. Gretta.    -pt with extensive vascular hx now with lifestyle limiting claudication in the LLE.  Her ABI has dropped to 56% with a toe pressure of 26.  Discussed proceeding with angiogram with LLE runoff and possible intervention due to elevated velocities in the left EIA.  Hopeful she can have endovascular option as she may be at increased risk from pulmonary standpoint for open surgery.   -she has palpable right pedal pulses. 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  She does  not have fear of food or post prandial pain.   -continue asa/statin/plavix  -discussed importance of increased walking daily  Current smoker -discussed the importance of smoking cessation as it does put her at risk for limb loss, heart attack, stroke, cancer and pulmonary issues.  She has tried to quit and continues trying to quit.  Discussed finding a different habit when she wants to smoke.  Also My Chart offers smoking cessation through [facility].    Lucie Apt, [facility] [phone]  Clinic MD:   Sheree

## 2024-10-27 ENCOUNTER — Encounter: Payer: Self-pay | Admitting: Radiology

## 2024-11-06 ENCOUNTER — Ambulatory Visit (HOSPITAL_COMMUNITY)
Admission: RE | Admit: 2024-11-06 | Discharge: 2024-11-06 | Disposition: A | Attending: Vascular Surgery | Admitting: Vascular Surgery

## 2024-11-06 ENCOUNTER — Encounter (HOSPITAL_COMMUNITY): Admission: RE | Disposition: A | Payer: Self-pay | Source: Home / Self Care | Attending: Vascular Surgery

## 2024-11-06 ENCOUNTER — Other Ambulatory Visit: Payer: Self-pay

## 2024-11-06 DIAGNOSIS — I10 Essential (primary) hypertension: Secondary | ICD-10-CM | POA: Insufficient documentation

## 2024-11-06 DIAGNOSIS — Z79899 Other long term (current) drug therapy: Secondary | ICD-10-CM | POA: Diagnosis not present

## 2024-11-06 DIAGNOSIS — Z7902 Long term (current) use of antithrombotics/antiplatelets: Secondary | ICD-10-CM | POA: Diagnosis not present

## 2024-11-06 DIAGNOSIS — Z95820 Peripheral vascular angioplasty status with implants and grafts: Secondary | ICD-10-CM | POA: Insufficient documentation

## 2024-11-06 DIAGNOSIS — I70212 Atherosclerosis of native arteries of extremities with intermittent claudication, left leg: Secondary | ICD-10-CM | POA: Diagnosis not present

## 2024-11-06 DIAGNOSIS — I771 Stricture of artery: Secondary | ICD-10-CM

## 2024-11-06 DIAGNOSIS — Z7982 Long term (current) use of aspirin: Secondary | ICD-10-CM | POA: Diagnosis not present

## 2024-11-06 DIAGNOSIS — E785 Hyperlipidemia, unspecified: Secondary | ICD-10-CM | POA: Diagnosis not present

## 2024-11-06 DIAGNOSIS — F17210 Nicotine dependence, cigarettes, uncomplicated: Secondary | ICD-10-CM | POA: Diagnosis not present

## 2024-11-06 DIAGNOSIS — I708 Atherosclerosis of other arteries: Secondary | ICD-10-CM | POA: Insufficient documentation

## 2024-11-06 HISTORY — PX: ABDOMINAL AORTOGRAM: CATH118222

## 2024-11-06 HISTORY — PX: LOWER EXTREMITY INTERVENTION: CATH118252

## 2024-11-06 HISTORY — PX: LOWER EXTREMITY ANGIOGRAPHY: CATH118251

## 2024-11-06 LAB — POCT I-STAT, CHEM 8
BUN: 6 mg/dL — ABNORMAL LOW (ref 8–23)
Calcium, Ion: 1.17 mmol/L (ref 1.15–1.40)
Chloride: 98 mmol/L (ref 98–111)
Creatinine, Ser: 1 mg/dL (ref 0.44–1.00)
Glucose, Bld: 132 mg/dL — ABNORMAL HIGH (ref 70–99)
HCT: 46 % (ref 36.0–46.0)
Hemoglobin: 15.6 g/dL — ABNORMAL HIGH (ref 12.0–15.0)
Potassium: 3.8 mmol/L (ref 3.5–5.1)
Sodium: 137 mmol/L (ref 135–145)
TCO2: 27 mmol/L (ref 22–32)

## 2024-11-06 LAB — POCT ACTIVATED CLOTTING TIME
Activated Clotting Time: 205 s
Activated Clotting Time: 164 s

## 2024-11-06 MED ORDER — LIDOCAINE HCL (PF) 1 % IJ SOLN
INTRAMUSCULAR | Status: AC
  Filled 2024-11-06: qty 30

## 2024-11-06 MED ORDER — IODIXANOL 320 MG/ML IV SOLN
INTRAVENOUS | Status: DC | PRN
  Administered 2024-11-06: 90 mL

## 2024-11-06 MED ORDER — HEPARIN SODIUM (PORCINE) 1000 UNIT/ML IJ SOLN
INTRAMUSCULAR | Status: AC
  Filled 2024-11-06: qty 10

## 2024-11-06 MED ORDER — HEPARIN SODIUM (PORCINE) 1000 UNIT/ML IJ SOLN
INTRAMUSCULAR | Status: DC | PRN
  Administered 2024-11-06: 6000 [IU] via INTRAVENOUS

## 2024-11-06 MED ORDER — MIDAZOLAM HCL (PF) 2 MG/2ML IJ SOLN
INTRAMUSCULAR | Status: DC | PRN
  Administered 2024-11-06: 1 mg via INTRAVENOUS

## 2024-11-06 MED ORDER — FENTANYL CITRATE (PF) 100 MCG/2ML IJ SOLN
25.0000 ug | Freq: Once | INTRAMUSCULAR | Status: AC
  Administered 2024-11-06: 25 ug via INTRAVENOUS

## 2024-11-06 MED ORDER — MIDAZOLAM HCL 2 MG/2ML IJ SOLN
INTRAMUSCULAR | Status: AC
  Filled 2024-11-06: qty 2

## 2024-11-06 MED ORDER — FENTANYL CITRATE (PF) 100 MCG/2ML IJ SOLN
INTRAMUSCULAR | Status: AC
  Filled 2024-11-06: qty 2

## 2024-11-06 MED ORDER — CLOPIDOGREL BISULFATE 75 MG PO TABS: 75.0000 mg | ORAL_TABLET | Freq: Every day | ORAL | Status: DC

## 2024-11-06 MED ORDER — CLOPIDOGREL BISULFATE 75 MG PO TABS
ORAL_TABLET | ORAL | Status: DC | PRN
Start: 2024-11-06 — End: 2024-11-06
  Administered 2024-11-06: 75 mg via ORAL

## 2024-11-06 MED ORDER — LIDOCAINE HCL (PF) 1 % IJ SOLN
INTRAMUSCULAR | Status: DC | PRN
Start: 2024-11-06 — End: 2024-11-06
  Administered 2024-11-06: 15 mL

## 2024-11-06 MED ORDER — CLOPIDOGREL BISULFATE 75 MG PO TABS
ORAL_TABLET | ORAL | Status: AC
  Filled 2024-11-06: qty 1

## 2024-11-06 MED ORDER — HEPARIN (PORCINE) IN NACL 2000-0.9 UNIT/L-% IV SOLN
INTRAVENOUS | Status: DC | PRN
  Administered 2024-11-06: 1000 mL

## 2024-11-06 MED ORDER — FENTANYL CITRATE (PF) 100 MCG/2ML IJ SOLN
INTRAMUSCULAR | Status: DC | PRN
  Administered 2024-11-06: 25 ug via INTRAVENOUS

## 2024-11-06 MED ORDER — SODIUM CHLORIDE 0.9% FLUSH: 3.0000 mL | INTRAVENOUS | Status: DC | PRN

## 2024-11-06 MED ORDER — CLOPIDOGREL BISULFATE 75 MG PO TABS: 75.0000 mg | ORAL_TABLET | Freq: Every day | ORAL | 4 refills | Status: DC

## 2024-11-06 MED ORDER — SODIUM CHLORIDE 0.9 % IV SOLN: INTRAVENOUS | Status: AC

## 2024-11-06 MED ORDER — CLOPIDOGREL BISULFATE 300 MG PO TABS: ORAL_TABLET | ORAL | Status: DC | PRN

## 2024-11-06 MED ORDER — HYDRALAZINE HCL 20 MG/ML IJ SOLN: 5.0000 mg | INTRAMUSCULAR | Status: DC | PRN

## 2024-11-06 MED ORDER — SODIUM CHLORIDE 0.9 % IV SOLN: INTRAVENOUS | Status: DC

## 2024-11-06 MED ORDER — OXYCODONE HCL 5 MG PO TABS: 5.0000 mg | ORAL_TABLET | ORAL | Status: DC | PRN

## 2024-11-06 MED ORDER — LABETALOL HCL 5 MG/ML IV SOLN: 10.0000 mg | INTRAVENOUS | Status: DC | PRN

## 2024-11-06 MED ORDER — ONDANSETRON HCL 4 MG/2ML IJ SOLN
4.0000 mg | Freq: Four times a day (QID) | INTRAMUSCULAR | Status: DC | PRN
Start: 2024-11-06 — End: 2024-11-06

## 2024-11-06 MED ORDER — SODIUM CHLORIDE 0.9 % IV SOLN: 250.0000 mL | INTRAVENOUS | Status: DC | PRN

## 2024-11-06 MED ORDER — ACETAMINOPHEN 325 MG PO TABS: 650.0000 mg | ORAL_TABLET | ORAL | Status: DC | PRN

## 2024-11-06 MED ORDER — SODIUM CHLORIDE 0.9% FLUSH: 3.0000 mL | Freq: Two times a day (BID) | INTRAVENOUS | Status: DC

## 2024-11-06 NOTE — H&P (Signed)
 History and Physical Interval Note:  11/06/2024 9:31 AM  Brenda Carney  has presented today for surgery, with the diagnosis of stenosis of left iilac.  The various methods of treatment have been discussed with the patient and family. After consideration of risks, benefits and other options for treatment, the patient has consented to  Procedure(s): ABDOMINAL AORTOGRAM (N/A) Lower Extremity Angiography (N/A) LOWER EXTREMITY INTERVENTION (N/A) as a surgical intervention.  The patient's history has been reviewed, patient examined, no change in status, stable for surgery.  I have reviewed the patient's chart and labs.  Questions were answered to the patient's satisfaction.     Brenda Carney   HISTORY AND PHYSICAL        CC:  follow up. Requesting Provider:  Stephane Leita DEL, MD   HPI: This is a 73 y.o. female who is here today for follow up for PAD.  Pt has hx of right above knee to below knee popliteal bypass with GSV by Dr. Laurence in 2018.  She underwent right SFA angioplasty and stent placement x 2 on 08/31/2022 for recurrent high-grade stenosis in the SFA proximal to her bypass by Dr. Gaskins. She then underwent left common femoral and SFA endarterectomy on 09/11/2022 by Dr. Gaskins for lifestyle limiting claudication.  She underwent left SFA lithotripsy and stenting on 12/07/2022 by Dr. Gaskins to treat continued left lower extremity intermittent claudication.  On 10/25/2023, she had angiogram with angioplasty and stent of right EIA by Dr. Gaskins.    Pt has not been since since her last procedure but did have studies last week.     The pt returns today for follow up.  She states that she does not have any trouble with her right leg but she has had increasing pain in the left leg over the past few weeks and it is getting worse.   She gets short distance claudication.  She denies pain in her foot or non healing wounds.  She states that her left foot turns red and white.  She continues to smoke but  has tried to quit and continues to work on this.  She does have oxygen  at home when she needs it.     The pt is on a statin for cholesterol management.    The pt is on an aspirin .    Other AC:  Plavix  The pt is on CCB, BB, ACEI for hypertension.  The pt is not on diabetic medication. Tobacco hx:  current   Pt does not have family hx of AAA.  She states that her father had bilateral amputations due to circulation issues.        Past Medical History:  Diagnosis Date   Arthritis      thumbs; hips (05/14/2017)   CAD S/P 2 V PCI (pRCA, PCx-OM)      Coronary calcium  scoring: June 2018: Score 1106. 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   Cervical spondylosis     Chronic lower back pain     COPD (chronic obstructive pulmonary disease) (HCC)      little bit (05/14/2017)   Depression     Exercise-induced asthma with acute exacerbation      only in very hot weather (05/14/2017)  GERD (gastroesophageal reflux disease)     History of IBS      resovlved   Hx of multiple concussions      from horse training and riding   Hyperlipidemia with target LDL less than 70      Mostly statin intolerant,. Currently on Livalo  4 mg   Hypertension, benign     Lumbosacral spondylosis     PAD (peripheral artery disease)      Status post right above-the-knee-below the knee popliteal artery bypass; postop right ABI 0.96.   Pneumonia 2020   Sleep apnea      does not wear CPAP; think it was medication related (05/14/2017)   Stroke [facility]) 2010    TIA   Thoracic aortic ectasia      ~40 mm on Coronary Calcium  Score CT - recommend f/u CTA or MRA   TIA (transient ischemic attack) 09/2011    several               Past Surgical  History:  Procedure Laterality Date   ABDOMINAL AORTOGRAM W/LOWER EXTREMITY N/A 05/14/2017    Procedure: Abdominal Aortogram w/Lower Extremity;  Surgeon: Laurence Redell CROME, MD;  Location: [facility] INVASIVE CV LAB;  Service: Cardiovascular;  Laterality: N/A;   ABDOMINAL AORTOGRAM W/LOWER EXTREMITY N/A 08/31/2022    Procedure: ABDOMINAL AORTOGRAM W/LOWER EXTREMITY;  Surgeon: Gretta Brenda PARAS, MD;  Location: [facility] INVASIVE CV LAB;  Service: Cardiovascular;  Laterality: N/A;   ABDOMINAL AORTOGRAM W/LOWER EXTREMITY N/A 12/07/2022    Procedure: ABDOMINAL AORTOGRAM W/LOWER EXTREMITY;  Surgeon: Gretta Brenda PARAS, MD;  Location: [facility] INVASIVE CV LAB;  Service: Cardiovascular;  Laterality: N/A;   ABDOMINAL AORTOGRAM W/LOWER EXTREMITY Right 10/25/2023    Procedure: ABDOMINAL AORTOGRAM W/LOWER EXTREMITY;  Surgeon: Gretta Brenda PARAS, MD;  Location: [facility] INVASIVE CV LAB;  Service: Cardiovascular;  Laterality: Right;   ACROMIO-CLAVICULAR JOINT REPAIR Left 1990s    I think it was called an [facility] joint removal   BACK SURGERY       BYPASS GRAFT POPLITEAL TO POPLITEAL Right 05/15/2017    Procedure: BYPASS GRAFT ABOVE KNEE POPLITEAL TO BELOW KNEE POPLITEAL ARTERY;  Surgeon: Laurence Redell CROME, MD;  Location: [facility] OR;  Service: Vascular;  Laterality: Right;   CARDIAC CATHETERIZATION       CORONARY STENT INTERVENTION N/A 09/30/2021    Procedure: CORONARY STENT INTERVENTION;  Surgeon: Anner Alm ORN, MD;  Location: [facility] INVASIVE CV LAB;  Service: Cardiovascular;  Laterality: N/A;   CORONARY ULTRASOUND/IVUS N/A 09/30/2021    Procedure: Intravascular Ultrasound/IVUS;  Surgeon: Anner Alm ORN, MD;  Location: [facility] INVASIVE CV LAB;  Service: Cardiovascular;  Laterality: N/A;   ENDARTERECTOMY FEMORAL Left 09/11/2022    Procedure: LEFT COMMON FEMORAL ENDARTERECTOMY;  Surgeon: Gretta Brenda PARAS, MD;  Location: [facility] OR;  Service: Vascular;  Laterality: Left;   ESOPHAGOGASTRODUODENOSCOPY (EGD) WITH PROPOFOL  N/A 04/29/2018    Procedure:  ESOPHAGOGASTRODUODENOSCOPY (EGD) WITH PROPOFOL ;  Surgeon: Elicia Claw, MD;  Location: [facility] ENDOSCOPY;  Service: Gastroenterology;  Laterality: N/A;   FOOT FRACTURE SURGERY Bilateral      multiple procedures   FRACTURE SURGERY       JOINT REPLACEMENT       LEFT HEART CATH AND CORONARY ANGIOGRAPHY N/A 09/30/2021    Procedure: LEFT HEART CATH AND CORONARY ANGIOGRAPHY;  Surgeon: Anner Alm ORN, MD;  Location: [facility] INVASIVE CV LAB;  Service: Cardiovascular;  Laterality: N/A;   LOWER EXTREMITY ANGIOGRAPHY N/A 06/16/2020    Procedure: LOWER EXTREMITY ANGIOGRAPHY;  Surgeon: Gretta Brenda PARAS, MD;  Location: [facility] INVASIVE CV LAB;  Service: Cardiovascular;  Laterality: N/A;   MAXIMUM ACCESS (MAS)POSTERIOR LUMBAR INTERBODY FUSION (PLIF) 1 LEVEL N/A 09/30/2014    Procedure: FOR MAXIMUM ACCESS (MAS) POSTERIOR LUMBAR INTERBODY FUSION (PLIF) 1 LEVEL;  Surgeon: Alm GORMAN Molt, MD;  Location: [facility] NEURO ORS;  Service: Neurosurgery;  Laterality: N/A;  FOR MAXIMUM ACCESS (MAS) POSTERIOR LUMBAR INTERBODY FUSION (PLIF) 1 LEVEL LUMBAR 4-5   MULTIPLE TOOTH EXTRACTIONS Right 04/2017   NM MYOVIEW  LTD   06/2017     Normal EF, 66%. No ST changes. Normal study. LOW RISK.   PATCH ANGIOPLASTY Left 09/11/2022    Procedure: LEFT FEMORAL PATCH ANGIOPLASTY USING XENOSURE BIOLOGIC PATCH 1CMX14CM;  Surgeon: Gretta Brenda PARAS, MD;  Location: [facility] OR;  Service: Vascular;  Laterality: Left;   PERIPHERAL VASCULAR ATHERECTOMY Left 06/16/2020    Procedure: PERIPHERAL VASCULAR ATHERECTOMY;  Surgeon: Gretta Brenda PARAS, MD;  Location: [facility] INVASIVE CV LAB;  Service: Cardiovascular;  Laterality: Left;  SFA   PERIPHERAL VASCULAR ATHERECTOMY Left 12/07/2022    Procedure: PERIPHERAL VASCULAR ATHERECTOMY;  Surgeon: Gretta Brenda PARAS, MD;  Location: [facility] INVASIVE CV LAB;  Service: Cardiovascular;  Laterality: Left;   PERIPHERAL VASCULAR INTERVENTION Right 08/31/2022    Procedure: PERIPHERAL VASCULAR INTERVENTION;  Surgeon: Gretta Brenda PARAS, MD;  Location: [facility] INVASIVE CV LAB;  Service: Cardiovascular;  Laterality: Right;   PERIPHERAL VASCULAR INTERVENTION   12/07/2022    Procedure: PERIPHERAL VASCULAR INTERVENTION;  Surgeon: Gretta Brenda PARAS, MD;  Location: [facility] INVASIVE CV LAB;  Service: Cardiovascular;;   PERIPHERAL VASCULAR INTERVENTION Right 10/25/2023    Procedure: PERIPHERAL VASCULAR INTERVENTION;  Surgeon: Gretta Brenda PARAS, MD;  Location: [facility] INVASIVE CV LAB;  Service: Cardiovascular;  Laterality: Right;  right external iliac   TOTAL HIP ARTHROPLASTY Right 01/16/2020    Procedure: RIGHT TOTAL HIP ARTHROPLASTY ANTERIOR APPROACH;  Surgeon: Vernetta Brenda GRADE, MD;  Location: [facility] ORS;  Service: Orthopedics;  Laterality: Right;   TOTAL HIP ARTHROPLASTY Left 04/30/2020    Procedure: LEFT TOTAL HIP ARTHROPLASTY ANTERIOR APPROACH;  Surgeon: Vernetta Brenda GRADE, MD;  Location: [facility] ORS;  Service: Orthopedics;  Laterality: Left;   TRANSTHORACIC ECHOCARDIOGRAM   09/07/2013    Done to evaluate TIA: Normal LV size and function. EF 55-60%. GR 1 DD. Mild left atrial dilation. Mildly calcified mitral leaflets. Otherwise normal.          Allergies       Allergies  Allergen Reactions   Penicillins Swelling and Other (See Comments)      TONGUE SWELLING     Nucynta Er [Tapentadol Hcl Er] Other (See Comments)      Dry mouth   Statins        muscle pain   Lyrica [Pregabalin] Other (See Comments)      DRY MOUTH              Current Outpatient Medications  Medication Sig Dispense Refill   albuterol  (VENTOLIN  HFA) 108 (90 Base) MCG/ACT inhaler Inhale 2 puffs into the lungs every 6 (six) hours as needed for shortness of breath. 18 g 0   amLODipine  (NORVASC ) 10 MG tablet Take 10 mg by mouth in the morning.       ARIPiprazole  (ABILIFY ) 2 MG tablet Take 2 mg by mouth every morning.       aspirin  EC 81 MG tablet Take 81 mg by mouth in the morning.       bisoprolol  (ZEBETA ) 5 MG tablet Take 1 tablet (5 mg total) by mouth  daily. 90 tablet 3   Brimonidine Tartrate (LUMIFY) 0.025 % SOLN Place 1 drop into both eyes daily as needed (redness).       Budeson-Glycopyrrol-Formoterol  (BREZTRI AEROSPHERE) 160-9-4.8 MCG/ACT AERO Inhale 1 puff into the lungs daily as needed (shortness of breath).       buPROPion  (WELLBUTRIN  XL) 150 MG 24 hr tablet Take 150 mg by mouth every morning.       clopidogrel  (PLAVIX ) 75 MG tablet TAKE ONE TABLET BY MOUTH ONCE DAILY 90 tablet 3   desvenlafaxine  (PRISTIQ ) 100 MG 24 hr tablet Take 100 mg by mouth in the morning.       diazepam  (VALIUM ) 10 MG tablet Take 10 mg by mouth at bedtime as needed for sleep or anxiety.       Evolocumab  (REPATHA  SURECLICK) 140 MG/ML SOAJ Inject 140 mg as directed every 14 (fourteen) days. 6 mL 3   Ferrous Sulfate  (IRON HIGH-POTENCY) 45 MG TBCR Take 45 mg by mouth daily.       lisinopril  (ZESTRIL ) 10 MG tablet Take 10 mg by mouth in the morning.       Multiple Vitamins-Minerals (CENTRUM SILVER 50+WOMEN) TABS Take 1 tablet by mouth daily.       naphazoline-pheniramine (VISINE) 0.025-0.3 % ophthalmic solution Place 1 drop into both eyes 4 (four) times daily as needed (dry/irritated eyes.).       oxyCODONE -acetaminophen  (PERCOCET) 10-325 MG tablet Take 1 tablet by mouth every 6 (six) hours as needed for pain.       pantoprazole  (PROTONIX ) 40 MG tablet Take 40 mg by mouth 2 (two) times daily as needed (acid reflux).       prazosin  (MINIPRESS ) 1 MG capsule Take 1 mg by mouth at bedtime.       Propylhexedrine (BENZEDREX NA) Place 1 spray into the nose 2 (two) times daily as needed (nasal congestion.).       traZODone  (DESYREL ) 50 MG tablet Take 50 mg by mouth at bedtime.          No current facility-administered medications for this visit.             Family History  Problem Relation Age of Onset   Stroke Father     Heart attack Mother     Stroke Mother            Social History         Socioeconomic History   Marital status: Widowed      Spouse name:  Vinie    Number of children: 0   Years of education: COLLEGE   Highest education level: Some college, no degree  Occupational History   Occupation: Physiological Scientist   Tobacco Use   Smoking status: Some Days      Current packs/day: 0.00      Types: Cigarettes      Passive exposure: Never   Smokeless tobacco: Never   Tobacco comments:      [name] says she smokes one cigarette every couple of weeks. [name] says she has a lot of friends who still smoke  Vaping Use   Vaping status: Former  Substance and Sexual Activity   Alcohol  use: Yes      Alcohol /week: 2.0 standard drinks of alcohol       Types: 2 Glasses of wine per week      Comment: wine   Drug use: No   Sexual activity: Not Currently      Birth control/protection: Post-menopausal  Other Topics Concern   Not  on file  Social History Narrative    Patient lives Alone is a Widow Patient has no children.     Patient is a physiological scientist.     Patient is right handed.     Patient has BA degree.     Smokes 1 cigarette every couple of weeks.    Social Drivers of Acupuncturist Strain: Not on file  Food Insecurity: Low Risk  (06/10/2024)    Received from [facility]    Hunger Vital Sign     Within the past 12 months, you worried that your food would run out before you got money to buy more: Never true     Within the past 12 months, the food you bought just didn't last and you didn't have money to get more. : Never true  Transportation Needs: No Transportation Needs (06/10/2024)    Received from [employer]     In the past 12 months, has lack of reliable transportation kept you from medical appointments, meetings, work or from getting things needed for daily living? : No  Physical Activity: Not on file  Stress: Not on file  Social Connections: Moderately Isolated (03/07/2024)    Social Connection and Isolation Panel     Frequency of Communication with Friends and Family: More than three times a week      Frequency of Social Gatherings with Friends and Family: Twice a week     Attends Religious Services: Never     Database Administrator or Organizations: Yes     Attends Engineer, Structural: More than 4 times per year     Marital Status: Widowed  Intimate Partner Violence: Not At Risk (03/07/2024)    Humiliation, Afraid, Rape, and Kick questionnaire     Fear of Current or Ex-Partner: No     Emotionally Abused: No     Physically Abused: No     Sexually Abused: No        REVIEW OF SYSTEMS:    [X]  denotes positive finding, [ ]  denotes negative finding Cardiac   Comments:  Chest pain or chest pressure:      Shortness of breath upon exertion:      Short of breath when lying flat:      Irregular heart rhythm:             Vascular      Pain in calf, thigh, or hip brought on by ambulation:      Pain in feet at night that wakes you up from your sleep:       Blood clot in your veins:      Leg swelling:              Pulmonary      Oxygen  at home: x available  Productive cough:       Wheezing:              Neurologic      Sudden weakness in arms or legs:       Sudden numbness in arms or legs:       Sudden onset of difficulty speaking or slurred speech:      Temporary loss of vision in one eye:       Problems with dizziness:              Gastrointestinal      Blood in stool:  Vomited blood:              Genitourinary      Burning when urinating:       Blood in urine:             Psychiatric      Major depression:              Hematologic      Bleeding problems:      Problems with blood clotting too easily:             Skin      Rashes or ulcers:             Constitutional      Fever or chills:          PHYSICAL EXAMINATION:      Today's Vitals    10/22/24 1458  BP: 131/88  Pulse: (!) 102  Temp: 98.1 F (36.7 C)  TempSrc: Temporal  PainSc: 6   PainLoc: Leg    There is no height or weight on file to calculate BMI.     General:  WDWN in  NAD; vital signs documented above Gait: Not observed HENT: WNL, normocephalic Pulmonary: normal non-labored breathing , without wheezing Cardiac: regular HR, without carotid bruits Abdomen: soft, NT; aortic pulse is not palpable Skin: without rashes Vascular Exam/Pulses:   Right Left  Radial 2+ (normal) 2+ (normal)  Femoral 2+ (normal) 2+ (normal)  DP 1+ (weak) monophasic  PT 2+ (normal) monophasic    Extremities: bilateral feet without wounds.  Left foot is ruborous  Musculoskeletal: no muscle wasting or atrophy           Neurologic: A&O X 3 Psychiatric:  The pt has Normal affect.     Non-Invasive Vascular Imaging:   Vascular duplex on 10/22/2024: +----------+--------+-----+--------+----------+--------+  LEFT     PSV cm/sRatioStenosisWaveform  Comments  +----------+--------+-----+--------+----------+--------+  EIA Mid   347          50-99%  biphasic            +----------+--------+-----+--------+----------+--------+  CFA Distal15                   biphasic            +----------+--------+-----+--------+----------+--------+  DFA      132                  biphasic            +----------+--------+-----+--------+----------+--------+  SFA Prox  15                   biphasic            +----------+--------+-----+--------+----------+--------+  SFA Mid   24                   monophasic          +----------+--------+-----+--------+----------+--------+  SFA Distal24                   monophasic          +----------+--------+-----+--------+----------+--------+  POP Prox  30                   monophasic          +----------+--------+-----+--------+----------+--------+  POP Distal17                   monophasic          +----------+--------+-----+--------+----------+--------+  ATA Distal20  monophasic          +----------+--------+-----+--------+----------+--------+  PTA Distal16                    monophasic          +----------+--------+-----+--------+----------+--------+   Summary:  Left: Heavily calcified superficial femoral artery with low velocities throughout.  External iliac artery velocity of 347 cm/s suggestive of a 50-99% stenosis.    Previous ABI's/TBI's on 10/13/2024: Right:  0.98/0.30 - Great toe pressure: 40 Left:  0.56/0.20 - Great toe pressure:  26   Previous arterial duplex on 10/13/2024: Abdominal Aorta Findings:  +-------------+-------+----------+----------+--------+--------+--------+  Location    AP (cm)Trans (cm)PSV (cm/s)WaveformThrombusComments  +-------------+-------+----------+----------+--------+--------+--------+  Proximal    2.70   2.50      38                                  +-------------+-------+----------+----------+--------+--------+--------+  Mid         2.30   3.10      55                        fusiform  +-------------+-------+----------+----------+--------+--------+--------+  Distal      1.80   2.10      51                                  +-------------+-------+----------+----------+--------+--------+--------+  RT CIA Prox  0.8    0.8       160                                 +-------------+-------+----------+----------+--------+--------+--------+  RT CIA Mid                    128                                 +-------------+-------+----------+----------+--------+--------+--------+  RT CIA Distal                 128                                 +-------------+-------+----------+----------+--------+--------+--------+  RT EIA Mid   1.0    0.9       79                                  +-------------+-------+----------+----------+--------+--------+--------+  RT EIA Distal                 145                                 +-------------+-------+----------+----------+--------+--------+--------+  LT CIA Prox  1.0    0.8       79                                   +-------------+-------+----------+----------+--------+--------+--------+  LT CIA Mid  84                                  +-------------+-------+----------+----------+--------+--------+--------+  LT CIA Distal                 57                                  +-------------+-------+----------+----------+--------+--------+--------+  LT EIA Prox                   260                                 +-------------+-------+----------+----------+--------+--------+--------+  LT EIA Mid   1.2    1.5       186                                 +-------------+-------+----------+----------+--------+--------+--------+  LT EIA Distal                 25                                  +-------------+-------+----------+----------+--------+--------+--------+        ASSESSMENT/PLAN:: 73 y.o. female here for follow up for PAD with hx of  right above knee to below knee popliteal bypass with GSV by Dr. Laurence in 2018.  She underwent right SFA angioplasty and stent placement x 2 on 08/31/2022 for recurrent high-grade stenosis in the SFA proximal to her bypass by Dr. Gretta. She then underwent left common femoral and SFA endarterectomy on 09/11/2022 by Dr. Gretta for lifestyle limiting claudication.  Most recently she required left SFA lithotripsy and stenting on 12/07/2022 by Dr. Gretta to treat continued left lower extremity intermittent claudication.  On 10/25/2023, she had angiogram with angioplasty and stent of right EIA by Dr. Gretta.      -pt with extensive vascular hx now with lifestyle limiting claudication in the LLE.  Her ABI has dropped to 56% with a toe pressure of 26.  Discussed proceeding with angiogram with LLE runoff and possible intervention due to elevated velocities in the left EIA.  Hopeful she can have endovascular option as she may be at increased risk from pulmonary standpoint for open surgery.   -she has palpable right pedal pulses. 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  She does not have fear of food or post prandial pain.   -continue asa/statin/plavix  -discussed importance of increased walking daily   Current smoker -discussed the importance of smoking cessation as it does put her at risk for limb loss, heart attack, stroke, cancer and pulmonary issues.  She has tried to quit and continues trying to quit.  Discussed finding a different habit when she wants to smoke.  Also My Chart offers smoking cessation through [facility].      Brenda Carney, [facility] [phone]   Clinic MD:   Brenda Carney

## 2024-11-06 NOTE — Progress Notes (Signed)
 Site area: R femoral Site Prior to Removal:  Level 0 Pressure Applied For: 35 min Manual:   yes Patient Status During Pull: stable  Post Pull Site:  Level 0 Post Pull Instructions Given:  yes Post Pull Pulses Present: bilateral PTs and DPs  Dressing Applied:  gauze & tegaderm Bedrest begins @ 1400 Comments:

## 2024-11-06 NOTE — Progress Notes (Signed)
 Pt and friend who is staying with her for 24 hrs received discharge teaching, teach back performed. Pt ambulated to bathroom, no bleeding noted, site soft. Iv removed, no complications. Pt escorted via wheelchair to friend's vehicle.

## 2024-11-06 NOTE — Discharge Instructions (Signed)
 Femoral Site Care This sheet gives you information about how to care for yourself after your procedure. Your health care provider may also give you more specific instructions. If you have problems or questions, contact your health care provider. What can I expect after the procedure?  After the procedure, it is common to have: Bruising that usually fades within 1-2 weeks. Tenderness at the site. Follow these instructions at home: Wound care Follow instructions from your health care provider about how to take care of your insertion site. Make sure you: Wash your hands with soap and water  before you change your bandage (dressing). If soap and water  are not available, use hand sanitizer. Remove your dressing as told by your health care provider. 24 hours Do not take baths, swim, or use a hot tub until your health care provider approves. You may shower 24-48 hours after the procedure or as told by your health care provider. Gently wash the site with plain soap and water . Pat the area dry with a clean towel. Do not rub the site. This may cause bleeding. Do not apply powder or lotion to the site. Keep the site clean and dry. Check your femoral site every day for signs of infection. Check for: Redness, swelling, or pain. Fluid or blood. Warmth. Pus or a bad smell. Activity For the first 2-3 days after your procedure, or as long as directed: Avoid climbing stairs as much as possible. Do not squat. Do not lift anything that is heavier than 10 lb (4.5 kg), or the limit that you are told, until your health care provider says that it is safe. For 5 days Rest as directed. Avoid sitting for a long time without moving. Get up to take short walks every 1-2 hours. Do not drive for 24 hours if you were given a medicine to help you relax (sedative). General instructions Take over-the-counter and prescription medicines only as told by your health care provider. Keep all follow-up visits as told by your  health care provider. This is important. Contact a health care provider if you have: A fever or chills. You have redness, swelling, or pain around your insertion site. Get help right away if: The catheter insertion area swells very fast. You pass out. You suddenly start to sweat or your skin gets clammy. The catheter insertion area is bleeding, and the bleeding does not stop when you hold steady pressure on the area. The area near or just beyond the catheter insertion site becomes pale, cool, tingly, or numb. These symptoms may represent a serious problem that is an emergency. Do not wait to see if the symptoms will go away. Get medical help right away. Call your local emergency services (911 in the [place] Do not drive yourself to the hospital. Summary After the procedure, it is common to have bruising that usually fades within 1-2 weeks. Check your femoral site every day for signs of infection. Do not lift anything that is heavier than 10 lb (4.5 kg), or the limit that you are told, until your health care provider says that it is safe. This information is not intended to replace advice given to you by your health care provider. Make sure you discuss any questions you have with your health care provider. Document Revised: 12/24/2017 Document Reviewed: 12/24/2017 Elsevier Patient Education  2020 [employer].

## 2024-11-06 NOTE — Op Note (Signed)
 Patient name: Brenda Carney MRN: [id number] DOB: 04/14/1951 Sex: female  11/06/2024 Pre-operative Diagnosis: Disabling left lower extremity claudication Post-operative diagnosis:  Same Surgeon:  Lonni DOROTHA Gaskins, MD Procedure Performed: 1.  Ultrasound-guided access right common femoral artery 2.  Aortogram with catheter selection of aorta 3.  Left lower extremity arteriogram with catheter selection of the popliteal artery 4.  Left SFA and above-knee popliteal artery shockwave lithotripsy (5 mm x 80 mm shockwave lithotripsy balloon x 400 pulses) 5.  Angioplasty and stent of the left SFA (6 mm x 150 mm Eluvia postdilated with a 5 mm balloon) 6.  Angioplasty and stent of left common and external iliac artery (8 mm x 50 mm Viabahn postdilated with 7 mm balloon) 7.  61 minutes of monitored moderate conscious sedation time   Indications: Patient is a 73 year old female previously undergone bilateral lower extremity interventions including prior left SFA stenting.  Now with increasing disabling left leg claudication symptoms.  Presents for angiogram with focus on the left leg after risk benefits discussed with possible intervention.    Findings:   US  guided access right common femoral artery.  Aortogram showed patent infrarenal aorta with patent bilateral iliacs.  Her aortoiliac segment is very calcified and diseased.  On the left she had a high-grade focal 70% stenosis with an occluded hypogastric at the junction of the common and external iliac artery.  The left common femoral endarterectomy site was widely patent.  Her left SFA was patent proximally but then she had an occluded mid SFA stent and reconstituted with a high-grade calcified 95% stenosis above the knee with eccentric lesion.  She had three-vessel runoff distally.  Ultimately I was able to get through the left SFA occlusion from her contralateral groin access.  Initially the above-knee popliteal high-grade 95% calcified  stenosis treated with a 5 mm shockwave lithotripsy balloon and then I treated the entire SFA segment that it was occluded with shockwave lithotripsy.  I then restented the mid to distal left SFA with a 6 mm x 150 mm Eluvia postdilated with a 5 mm Mustang.  I then treated her left common/external iliac artery stenosis where we did a pullback pressure with a 20 mm gradient with a 8 mm Viabahn postdilated with a 7 mm balloon)   Procedure:  The patient was identified in the holding area and taken to room 8.  The patient was then placed supine on the table and prepped and draped in the usual sterile fashion.  A time out was called.  Patient received Versed  and fentanyl  for conscious moderate sedation.  Vital signs were monitored including heart rate, respiratory rate, oxygenation and blood pressure.  I was present for all moderate sedation.  Ultrasound was used to evaluate the right common femoral artery.  It was patent .  A digital ultrasound image was acquired.  A micropuncture needle was used to access the right common femoral artery under ultrasound guidance.  An 018 wire was advanced without resistance and a micropuncture sheath was placed.  The 018 wire was removed and a benson wire was placed.  The micropuncture sheath was exchanged for a 5 french sheath.  An omniflush catheter was advanced over the wire to the level of L-1.  An abdominal angiogram was obtained.  Next, using the omniflush catheter and a benson wire, the aortic bifurcation was crossed and the catheter was placed into theleft external iliac artery and left runoff was obtained.  Pertinent findings are noted above.  Ultimately  elected for intervention.  I did use a Glidewire advantage and upsized to a 7 French catapult sheath in the right groin over the aortic bifurcation.  The patient given 100 units/kg IV heparin .  I then went through to the left SFA including the occluded stent into the popliteal artery behind the knee and got through the true  lumen with a quick cross catheter and through the occluded segment..  I confirmed with hand-injection I was in the true lumen of the vessel.  I exchanged for a [place] core wire down the AT.  The left above-knee popliteal artery high-grade 95% calcified stenosis was treated with shockwave lithotripsy using a 5 mm x 80 mm shockwave balloon inflated to 2 and 4 atm and then I ended up using the shockwave balloon throughout the entire left SFA that was occluded and calcified for a total of 400 pulses.  We now had a flow channel.  There was significant residual disease so elected to restent this with a 6 mm x 150 mm Eluvia postdilated with a 5 mm Mustang in the left SFA.  I was happy with these results.  We had already done a pullback pressure in the left iliac showing a 20 mm gradient across the 70% stenosis at the takeoff of the hypogastric in the proximal external iliac artery.  This was stented with an 8 mm x 50 mm Viabahn postdilated with a 7 mm balloon.  Satisfied with results she had patent stents with preserved runoff.  Wires and catheters were removed and put a short 7 French sheath in the right groin.  Taken to holding in stable condition for sheath removal.  Plan: Optimized.  Aspirin  Plavix .  Follow-up in 4 to 6 weeks with duplex imaging.   Lonni DOROTHA Gaskins, MD [facility] Office: [phone]

## 2024-11-07 ENCOUNTER — Encounter (HOSPITAL_COMMUNITY): Payer: Self-pay | Admitting: Vascular Surgery

## 2024-11-11 DIAGNOSIS — M25512 Pain in left shoulder: Secondary | ICD-10-CM | POA: Diagnosis not present

## 2024-11-11 DIAGNOSIS — G894 Chronic pain syndrome: Secondary | ICD-10-CM | POA: Diagnosis not present

## 2024-11-11 DIAGNOSIS — M5412 Radiculopathy, cervical region: Secondary | ICD-10-CM | POA: Diagnosis not present

## 2024-11-11 DIAGNOSIS — Z79891 Long term (current) use of opiate analgesic: Secondary | ICD-10-CM | POA: Diagnosis not present

## 2024-11-11 DIAGNOSIS — M79673 Pain in unspecified foot: Secondary | ICD-10-CM | POA: Diagnosis not present

## 2024-11-17 ENCOUNTER — Other Ambulatory Visit: Payer: Self-pay

## 2024-11-17 DIAGNOSIS — I771 Stricture of artery: Secondary | ICD-10-CM

## 2024-11-17 DIAGNOSIS — I739 Peripheral vascular disease, unspecified: Secondary | ICD-10-CM

## 2024-11-18 DIAGNOSIS — E785 Hyperlipidemia, unspecified: Secondary | ICD-10-CM | POA: Diagnosis not present

## 2024-11-18 DIAGNOSIS — E7841 Elevated Lipoprotein(a): Secondary | ICD-10-CM | POA: Diagnosis not present

## 2024-11-27 ENCOUNTER — Telehealth (HOSPITAL_COMMUNITY): Payer: Self-pay | Admitting: Pharmacy Technician

## 2024-11-27 ENCOUNTER — Other Ambulatory Visit (HOSPITAL_COMMUNITY): Payer: Self-pay | Admitting: Pharmacist

## 2024-11-27 NOTE — Telephone Encounter (Signed)
 Auth Submission: NO AUTH NEEDED Site of care: [facility] Payer: MEDICARE A/B, [employer]BS SUPP Medication & CPT/J Code(s) submitted: Prolia  (Denosumab ) [id number] Diagnosis Code: M81.0 Route of submission (phone, fax, portal):  Phone # Fax # Auth type: Buy/Bill PB Units/visits requested: 60mg  x 2 doses, q 6 months Reference number:  Approval from: 11/27/2024 to 12/24/25    Brenda Carney, CPhT [facility] Phone: [phone] 11/27/2024

## 2024-11-28 ENCOUNTER — Encounter: Payer: Self-pay | Admitting: Internal Medicine

## 2024-12-01 ENCOUNTER — Ambulatory Visit (INDEPENDENT_AMBULATORY_CARE_PROVIDER_SITE_OTHER)

## 2024-12-01 VITALS — BP 136/83 | HR 86 | Temp 98.1°F | Resp 18 | Ht 65.0 in | Wt 126.0 lb

## 2024-12-01 DIAGNOSIS — M810 Age-related osteoporosis without current pathological fracture: Secondary | ICD-10-CM | POA: Diagnosis not present

## 2024-12-01 MED ORDER — DENOSUMAB 60 MG/ML ~~LOC~~ SOSY
60.0000 mg | PREFILLED_SYRINGE | Freq: Once | SUBCUTANEOUS | Status: AC
  Administered 2024-12-01: 60 mg via SUBCUTANEOUS
  Filled 2024-12-01: qty 1

## 2024-12-01 NOTE — Progress Notes (Signed)
 Diagnosis: Osteoporosis  Provider:  Chilton Greathouse MD  Procedure: Injection  Prolia (Denosumab), Dose: 60 mg, Site: subcutaneous, Number of injections: 1  Injection Site(s): Right arm  Post Care: Patient declined observation  Discharge: Condition: Good, Destination: Home . AVS Provided  Performed by:  Adriana Mccallum, RN

## 2024-12-08 DIAGNOSIS — F3341 Major depressive disorder, recurrent, in partial remission: Secondary | ICD-10-CM | POA: Diagnosis not present

## 2024-12-08 DIAGNOSIS — F411 Generalized anxiety disorder: Secondary | ICD-10-CM | POA: Diagnosis not present

## 2024-12-09 ENCOUNTER — Ambulatory Visit: Admitting: Vascular Surgery

## 2024-12-15 NOTE — Progress Notes (Unsigned)
 "   Patient name: Brenda Carney MRN: [id number] DOB: December 31, 1950 Sex: female  REASON FOR CONSULT: Follow-up left leg intervention for PAD  HPI: Brenda Carney is a 72 y.o. female, that presents for follow-up after recent left leg intervention for PAD.  On 11/06/2024 she underwent left SFA popliteal artery lithotripsy with stent of the SFA as well as angioplasty and stent of the left common and external iliac artery.  She has had a prior left common femoral endarterectomy on 09/11/2022 followed by a left SFA stent on 12/07/2022.  Also had a right leg bypass by Dr. Laurence and had prior right SFA intervention in the past.  Past Medical History:  Diagnosis Date   Arthritis    thumbs; hips (05/14/2017)   CAD S/P 2 V PCI (pRCA, PCx-OM)    Coronary calcium  scoring: June 2018: Score 1106. 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   Cervical spondylosis    Chronic lower back pain    COPD (chronic obstructive pulmonary disease) (HCC)    little bit (05/14/2017)   Depression    Exercise-induced asthma with acute exacerbation    only in very hot weather (05/14/2017)   GERD (gastroesophageal reflux disease)    History of IBS    resovlved   Hx of multiple concussions    from horse training and riding   Hyperlipidemia with target LDL less than 70    Mostly statin intolerant,. Currently on Livalo  4 mg   Hypertension, benign    Lumbosacral spondylosis    PAD (peripheral artery disease)    Status post right above-the-knee-below the knee popliteal artery bypass; postop right ABI 0.96.   Pneumonia 2020   Sleep apnea    does not wear CPAP; think it was medication related (05/14/2017)   Stroke [facility]) 2010   TIA   Thoracic aortic  ectasia    ~40 mm on Coronary Calcium  Score CT - recommend f/u CTA or MRA   TIA (transient ischemic attack) 09/2011   several    Past Surgical History:  Procedure Laterality Date   ABDOMINAL AORTOGRAM N/A 11/06/2024   Procedure: ABDOMINAL AORTOGRAM;  Surgeon: Gretta Lonni PARAS, MD;  [facility]: [facility] INVAS[facility] CV LAB;  Service: Cardiovascular;  Laterality: N/A;   ABDOMINAL AORTOGRAM W/LOWER EXTREMITY N/A 05/14/2017   Procedure: Abdominal Aortogram w/Lower Extremity;  Surgeon: Laurence Redell CROME, MD;  Location: [facility] INVASIVE CV LAB;  Service: Cardiovascular;  Laterality: N/A;   ABDOMINAL AORTOGRAM W/LOWER EXTREMITY N/A 08/31/2022   Procedure: ABDOMINAL AORTOGRAM W/LOWER EXTREMITY;  Surgeon: Gretta Lonni PARAS, MD;  Location: [facility] CV [facility];  Service: Cardiovascular;  Laterality: N/A;   ABDOMINAL AORTOGRAM W/LOWER EXTREMITY N/A 12/07/2022   Procedure: ABDOMINAL AORTOGRAM W/LOWER EXTREMITY;  Surgeon: Gretta Lonni PARAS, MD;  [facility]: [facility];  Service: Cardiovascular;  Laterality: N/A;   ABDOMINAL AORTOGRAM W/LOWER EXTREMITY Right 10/25/2023   Procedure: ABDOMINAL AORTOGRAM W/LOWER EXTREMITY;  Surgeon: Gretta Lonni PARAS, MD;  [facility]: [facility];  Service: Cardiovascular;  Laterality: Right;   ACROMIO-CLAVICULAR JOINT REPAIR Left 1990s   I think it was called an AC joint removal   BACK SURGERY     BYPASS GRAFT POPLITEAL TO POPLITEAL Right 05/15/2017   Procedure: BYPASS GRAFT ABOVE KNEE POPLITEAL TO BELOW  KNEE POPLITEAL ARTERY;  Surgeon: Laurence Redell CROME, MD;  Location: [facility] OR;  Service: Vascular;  Laterality: Right;   CARDIAC CATHETERIZATION     CORONARY STENT INTERVENTION N/A 09/30/2021   Procedure: CORONARY STENT INTERVENTION;  Surgeon: Anner Alm ORN, MD;  Location: [facility] INVASIVE CV LAB;  Service: Cardiovascular;  Laterality: N/A;   CORONARY ULTRASOUND/IVUS N/A 09/30/2021   Procedure: Intravascular Ultrasound/IVUS;  Surgeon: Anner Alm ORN, MD;  Location: [facility] INVASIVE  CV LAB;  Service: Cardiovascular;  Laterality: N/A;   ENDARTERECTOMY FEMORAL Left 09/11/2022   Procedure: LEFT COMMON FEMORAL ENDARTERECTOMY;  Surgeon: Gretta Lonni PARAS, MD;  Location: [facility] OR;  Service: Vascular;  Laterality: Left;   ESOPHAGOGASTRODUODENOSCOPY (EGD) WITH PROPOFOL  N/A 04/29/2018   Procedure: ESOPHAGOGASTRODUODENOSCOPY (EGD) WITH PROPOFOL ;  Surgeon: Elicia Claw, MD;  [facility];  Service: Gastroenterology;  Laterality: N/A;   FOOT FRACTURE SURGERY Bilateral    multiple procedures   FRACTURE SURGERY     JOINT REPLACEMENT     LEFT HEART CATH AND CORONARY ANGIOGRAPHY N/A 09/30/2021   Procedure: LEFT HEART CATH AND CORONARY ANGIOGRAPHY;  Surgeon: Anner Alm ORN, MD;  Location: [facility] INVASIVE CV LAB;  Service: Cardiovascular;  Laterality: N/A;   LOWER EXTREMITY ANGIOGRAPHY N/A 06/16/2020   Procedure: LOWER EXTREMITY ANGIOGRAPHY;  Surgeon: Gretta Lonni PARAS, MD;  [facility];  Service: Cardiovascular;  Laterality: N/A;   LOWER EXTREMITY ANGIOGRAPHY N/A 11/06/2024   Procedure: Lower Extremity Angiography;  Surgeon: Gretta Lonni PARAS, MD;  Location: [facility] INVASIVE CV LAB;  Service: Cardiovascular;  Laterality: N/A;   LOWER EXTREMITY INTERVENTION N/A 11/06/2024   Procedure: LOWER EXTREMITY INTERVENTION;  Surgeon: Gretta Lonni PARAS, MD;  [facility];  Service: Cardiovascular;  Laterality: N/A;   MAXIMUM ACCESS (MAS)POSTERIOR LUMBAR INTERBODY FUSION (PLIF) 1 LEVEL N/A 09/30/2014   Procedure: FOR MAXIMUM ACCESS (MAS) POSTERIOR LUMBAR INTERBODY FUSION (PLIF) 1 LEVEL;  Surgeon: Alm GORMAN Molt, MD;  [facility]: [facility]O [facility];  Service: Neurosurgery;  Laterality: N/A;  FOR MAXIMUM ACCESS (MAS) POSTERIOR LUMBAR INTERBODY FUSION (PLIF) 1 LEVEL LUMBAR 4-5   MULTIPLE TOOTH EXTRACTIONS Right 04/2017   NM MYOVIEW  LTD  06/2017    Normal EF, 66%. No ST changes. Normal study. LOW RISK.   PATCH ANGIOPLASTY Left 09/11/2022   Procedure: LEFT FEMORAL  PATCH ANGIOPLASTY USING XENOSURE BIOLOGIC PATCH 1CMX14CM;  Surgeon: Gretta Lonni PARAS, MD;  Location: [facility] OR;  Service: Vascular;  Laterality: Left;   PERIPHERAL VASCULAR ATHERECTOMY Left 06/16/2020   Procedure: PERIPHERAL VASCULAR ATHERECTOMY;  Surgeon: Gretta Lonni PARAS, MD;  Location: [facility] INVASIVE CV LAB;  Service: Cardiovascular;  Laterality: Left;  SFA   PERIPHERAL VASCULAR ATHERECTOMY Left 12/07/2022   Procedure: PERIPHERAL VASCULAR ATHERECTOMY;  Surgeon: Gretta Lonni PARAS, MD;  [facility];  Service: Cardiovascular;  Laterality: Left;   PERIPHERAL VASCULAR INTERVENTION Right 08/31/2022   Procedure: PERIPHERAL VASCULAR INTERVENTION;  Surgeon: Gretta Lonni PARAS, MD;  [facility];  Service: Cardiovascular;  Laterality: Right;   PERIPHERAL VASCULAR INTERVENTION  12/07/2022   Procedure: PERIPHERAL VASCULAR INTERVENTION;  Surgeon: Gretta Lonni PARAS, MD;  [facility];  Service: Cardiovascular;;   PERIPHERAL VASCULAR INTERVENTION Right 10/25/2023   Procedure: PERIPHERAL VASCULAR INTERVENTION;  Surgeon: Gretta Lonni PARAS, MD;  Location: [facility] INVASIVE CV LAB;  Service: Cardiovascular;  Laterality: Right;  right external iliac   TOTAL HIP ARTHROPLASTY Right 01/16/2020   Procedure: RIGHT TOTAL HIP ARTHROPLASTY ANTERIOR APPROACH;  Surgeon: Vernetta Lonni GRADE, MD;  Location:  [facility] ORS;  Service: Orthopedics;  Laterality: Right;   TOTAL HIP ARTHROPLASTY Left 04/30/2020   Procedure: LEFT TOTAL HIP ARTHROPLASTY ANTERIOR APPROACH;  Surgeon: Vernetta Lonni GRADE, MD;  Location: [facility] ORS;  Service: Orthopedics;  Laterality: Left;   TRANSTHORACIC ECHOCARDIOGRAM  09/07/2013   Done to evaluate TIA: Normal LV size and function. EF 55-60%. GR 1 DD. Mild left atrial dilation. Mildly calcified mitral leaflets. Otherwise normal.    Family History  Problem Relation Age of Onset   Stroke Father    Heart attack Mother    Stroke Mother     SOCIAL  HISTORY: Social History   Socioeconomic History   Marital status: Widowed    Spouse name: Vinie    Number of children: 0   Years of education: COLLEGE   Highest education level: Some college, no degree  Occupational History   Occupation: Physiological Scientist   Tobacco Use   Smoking status: Some Days    Current packs/day: 0.00    Types: Cigarettes    Passive exposure: Never   Smokeless tobacco: Never   Tobacco comments:    [name] says she smokes one cigarette every couple of weeks. [name] says she has a lot of friends who still smoke  Vaping Use   Vaping status: Former  Substance and Sexual Activity   Alcohol  use: Yes    Alcohol /week: 2.0 standard drinks of alcohol     Types: 2 Glasses of wine per week    Comment: wine   Drug use: No   Sexual activity: Not Currently    Birth control/protection: Post-menopausal  Other Topics Concern   Not on file  Social History Narrative   Patient lives Alone is a Widow Patient has no children.    Patient is a physiological scientist.    Patient is right handed.    Patient has BA degree.    Smokes 1 cigarette every couple of weeks.   Social Drivers of Health   Tobacco Use: High Risk (10/22/2024)   Patient History    Smoking Tobacco Use: Some Days    Smokeless Tobacco Use: Never    Passive Exposure: Never  Financial Resource Strain: Not on file  Food Insecurity: Low Risk (06/10/2024)   Received from [facility]   Epic    Within the past 12 months, you worried that your food would run out before you got money to buy more: Never true    Within the past 12 months, the food you bought just didn't last and you didn't have money to get more. : Never true  Transportation Needs: No Transportation Needs (06/10/2024)   Received from [facility]    In the past 12 months, has lack of reliable transportation kept you from medical appointments, meetings, work or from getting things needed for daily living? : No  Physical Activity: Not on file   Stress: Not on file  Social Connections: Moderately Isolated (03/07/2024)   Social Connection and Isolation Panel    Frequency of Communication with Friends and Family: More than three times a week    Frequency of Social Gatherings with Friends and Family: Twice a week    Attends Religious Services: Never    Database Administrator or Organizations: Yes    Attends Engineer, Structural: More than 4 times per year    Marital Status: Widowed  Intimate Partner Violence: Not At Risk (03/07/2024)   Humiliation, Afraid, Rape, and Kick questionnaire    Fear of Current or  Ex-Partner: No    Emotionally Abused: No    Physically Abused: No    Sexually Abused: No  Depression (PHQ2-9): Low Risk (01/27/2022)   Depression (PHQ2-9)    PHQ-2 Score: 0  Alcohol  Screen: Not on file  Housing: Low Risk (06/10/2024)   Received from [facility]   Epic    What is your living situation today?: I have a steady place to live    Think about the place you live. Do you have problems with any of the following? Choose all that apply:: None/None on this list  Utilities: Low Risk (06/10/2024)   Received from [facility]   Utilities    In the past 12 months has the electric, gas, oil, or water  company threatened to shut off services in your home? : No  Health Literacy: Not on file    Allergies[1]  Current Outpatient Medications  Medication Sig Dispense Refill   albuterol  (VENTOLIN  HFA) 108 (90 Base) MCG/ACT inhaler Inhale 2 puffs into the lungs every 6 (six) hours as needed for shortness of breath. 18 g 0   amLODipine  (NORVASC ) 10 MG tablet Take 10 mg by mouth in the morning.     Apoaequorin (PREVAGEN) 10 MG CAPS Take 10 mg by mouth daily.     ARIPiprazole  (ABILIFY ) 2 MG tablet Take 2 mg by mouth every morning.     aspirin  EC 81 MG tablet Take 81 mg by mouth in the morning.     aspirin -acetaminophen -caffeine (EXCEDRIN EXTRA STRENGTH) 250-250-65 MG tablet Take 2 tablets by mouth every 6 (six) hours as  needed for headache.     bisoprolol  (ZEBETA ) 5 MG tablet Take 1 tablet (5 mg total) by mouth daily. 90 tablet 3   Brimonidine Tartrate (LUMIFY) 0.025 % SOLN Place 1 drop into both eyes daily as needed (redness).     Budeson-Glycopyrrol-Formoterol  (BREZTRI AEROSPHERE) 160-9-4.8 MCG/ACT AERO Inhale 1 puff into the lungs daily as needed (shortness of breath).     buPROPion  (WELLBUTRIN  XL) 150 MG 24 hr tablet Take 150 mg by mouth every morning.     clopidogrel  (PLAVIX ) 75 MG tablet Take 1 tablet (75 mg total) by mouth daily. 90 tablet 4   desvenlafaxine  (PRISTIQ ) 50 MG 24 hr tablet Take 50 mg by mouth in the morning.     diazepam  (VALIUM ) 10 MG tablet Take 10 mg by mouth at bedtime as needed for sleep or anxiety.     Evolocumab  (REPATHA  SURECLICK) 140 MG/ML SOAJ Inject 140 mg as directed every 14 (fourteen) days. (Patient not taking: Reported on 11/03/2024) 6 mL 3   Ferrous Sulfate  (IRON HIGH-POTENCY) 45 MG TBCR Take 45 mg by mouth daily as needed (Low iron). Slow iron     lisinopril  (ZESTRIL ) 10 MG tablet Take 10 mg by mouth in the morning.     Multiple Vitamins-Minerals (CENTRUM SILVER 50+WOMEN) TABS Take 1 tablet by mouth daily.     naphazoline-pheniramine (VISINE) 0.025-0.3 % ophthalmic solution Place 1 drop into both eyes 4 (four) times daily as needed (dry/irritated eyes.).     oxyCODONE -acetaminophen  (PERCOCET) 10-325 MG tablet Take 1 tablet by mouth every 6 (six) hours as needed for pain.     pantoprazole  (PROTONIX ) 40 MG tablet Take 40 mg by mouth 2 (two) times daily as needed (acid reflux).     prazosin  (MINIPRESS ) 1 MG capsule Take 1 mg by mouth at bedtime as needed (Sleep).     Propylhexedrine (BENZEDREX NA) Place 1 spray into the nose 2 (two) times daily  as needed (nasal congestion.).     traZODone  (DESYREL ) 50 MG tablet Take 50 mg by mouth at bedtime as needed for sleep.     No current facility-administered medications for this visit.    REVIEW OF SYSTEMS:  [X]  denotes positive  finding, [ ]  denotes negative finding Cardiac  Comments:  Chest pain or chest pressure: ***   Shortness of breath upon exertion:    Short of breath when lying flat:    Irregular heart rhythm:        Vascular    Pain in calf, thigh, or hip brought on by ambulation:    Pain in feet at night that wakes you up from your sleep:     Blood clot in your veins:    Leg swelling:         Pulmonary    Oxygen  at home:    Productive cough:     Wheezing:         Neurologic    Sudden weakness in arms or legs:     Sudden numbness in arms or legs:     Sudden onset of difficulty speaking or slurred speech:    Temporary loss of vision in one eye:     Problems with dizziness:         Gastrointestinal    Blood in stool:     Vomited blood:         Genitourinary    Burning when urinating:     Blood in urine:        Psychiatric    Major depression:         Hematologic    Bleeding problems:    Problems with blood clotting too easily:        Skin    Rashes or ulcers:        Constitutional    Fever or chills:      PHYSICAL EXAM: There were no vitals filed for this visit.  GENERAL: The patient is a well-nourished female, in no acute distress. The vital signs are documented above. CARDIAC: There is a regular rate and rhythm.  VASCULAR: *** PULMONARY: There is good air exchange bilaterally without wheezing or rales. ABDOMEN: Soft and non-tender with normal pitched bowel sounds.  MUSCULOSKELETAL: There are no major deformities or cyanosis. NEUROLOGIC: No focal weakness or paresthesias are detected. SKIN: There are no ulcers or rashes noted. PSYCHIATRIC: The patient has a normal affect.  DATA:   ***  Assessment/Plan:  73 y.o. female, that presents for follow-up after recent left leg intervention for PAD.  On 11/06/2024 she underwent left SFA popliteal artery lithotripsy with stent of the SFA as well as angioplasty and stent of the left common and external iliac artery.  She has had a  prior left common femoral endarterectomy on 09/11/2022 followed by a left SFA stent on 12/07/2022.  Also had a right leg bypass by Dr. Laurence and had prior right SFA intervention in the past.   Lonni DOROTHA Gaskins, MD [facility]: [phone]        [1]  Allergies Allergen Reactions   Penicillins Swelling and Other (See Comments)    TONGUE SWELLING    Nucynta Er [Tapentadol Hcl Er] Other (See Comments)    Dry mouth   Statins     muscle pain   Lyrica [Pregabalin] Other (See Comments)    Muscle sore   "

## 2024-12-16 ENCOUNTER — Ambulatory Visit (HOSPITAL_BASED_OUTPATIENT_CLINIC_OR_DEPARTMENT_OTHER)
Admit: 2024-12-16 | Discharge: 2024-12-16 | Disposition: A | Discharge status: Home / Self Care | Attending: Surgery | Admitting: Surgery

## 2024-12-16 ENCOUNTER — Ambulatory Visit: Admitting: Vascular Surgery

## 2024-12-16 ENCOUNTER — Encounter: Payer: Self-pay | Admitting: Vascular Surgery

## 2024-12-16 ENCOUNTER — Ambulatory Visit (HOSPITAL_COMMUNITY)
Admission: RE | Admit: 2024-12-16 | Discharge: 2024-12-16 | Disposition: A | Discharge status: Home / Self Care | Source: Ambulatory Visit | Attending: Surgery | Admitting: Surgery

## 2024-12-16 VITALS — BP 125/81 | HR 75 | Temp 98.6°F | Resp 24 | Ht 65.0 in | Wt 123.7 lb

## 2024-12-16 DIAGNOSIS — I739 Peripheral vascular disease, unspecified: Secondary | ICD-10-CM | POA: Diagnosis not present

## 2024-12-16 DIAGNOSIS — I771 Stricture of artery: Secondary | ICD-10-CM

## 2024-12-16 LAB — VAS US ABI WITH/WO TBI
Left ABI: 1.02
Right ABI: 0.99

## 2024-12-16 MED ORDER — CLOPIDOGREL BISULFATE 75 MG PO TABS: 75.0000 mg | ORAL_TABLET | Freq: Every day | ORAL | 4 refills | Status: AC

## 2024-12-22 ENCOUNTER — Other Ambulatory Visit: Payer: Self-pay | Admitting: *Deleted

## 2024-12-22 DIAGNOSIS — I771 Stricture of artery: Secondary | ICD-10-CM

## 2024-12-22 DIAGNOSIS — I70202 Unspecified atherosclerosis of native arteries of extremities, left leg: Secondary | ICD-10-CM

## 2024-12-22 DIAGNOSIS — I70229 Atherosclerosis of native arteries of extremities with rest pain, unspecified extremity: Secondary | ICD-10-CM

## 2024-12-22 DIAGNOSIS — I739 Peripheral vascular disease, unspecified: Secondary | ICD-10-CM

## 2025-01-08 ENCOUNTER — Other Ambulatory Visit: Payer: Self-pay | Admitting: Family Medicine

## 2025-01-08 DIAGNOSIS — S22000A Wedge compression fracture of unspecified thoracic vertebra, initial encounter for closed fracture: Secondary | ICD-10-CM

## 2025-01-13 ENCOUNTER — Ambulatory Visit
Admission: RE | Admit: 2025-01-13 | Discharge: 2025-01-13 | Disposition: A | Source: Ambulatory Visit | Attending: Family Medicine | Admitting: Family Medicine

## 2025-01-13 DIAGNOSIS — S22000A Wedge compression fracture of unspecified thoracic vertebra, initial encounter for closed fracture: Secondary | ICD-10-CM

## 2025-01-15 ENCOUNTER — Inpatient Hospital Stay (HOSPITAL_COMMUNITY)
Admission: EM | Admit: 2025-01-15 | Discharge: 2025-01-16 | DRG: 189 | Disposition: A | Discharge status: Home / Self Care | Attending: Internal Medicine | Admitting: Internal Medicine

## 2025-01-15 ENCOUNTER — Emergency Department (HOSPITAL_COMMUNITY)

## 2025-01-15 ENCOUNTER — Other Ambulatory Visit: Payer: Self-pay

## 2025-01-15 ENCOUNTER — Encounter (HOSPITAL_COMMUNITY): Payer: Self-pay

## 2025-01-15 DIAGNOSIS — Z955 Presence of coronary angioplasty implant and graft: Secondary | ICD-10-CM

## 2025-01-15 DIAGNOSIS — I739 Peripheral vascular disease, unspecified: Secondary | ICD-10-CM | POA: Diagnosis present

## 2025-01-15 DIAGNOSIS — J441 Chronic obstructive pulmonary disease with (acute) exacerbation: Secondary | ICD-10-CM | POA: Diagnosis not present

## 2025-01-15 DIAGNOSIS — Z9981 Dependence on supplemental oxygen: Secondary | ICD-10-CM

## 2025-01-15 DIAGNOSIS — Z716 Tobacco abuse counseling: Secondary | ICD-10-CM

## 2025-01-15 DIAGNOSIS — F32A Depression, unspecified: Secondary | ICD-10-CM | POA: Diagnosis present

## 2025-01-15 DIAGNOSIS — Z888 Allergy status to other drugs, medicaments and biological substances status: Secondary | ICD-10-CM

## 2025-01-15 DIAGNOSIS — Z7902 Long term (current) use of antithrombotics/antiplatelets: Secondary | ICD-10-CM

## 2025-01-15 DIAGNOSIS — Z95828 Presence of other vascular implants and grafts: Secondary | ICD-10-CM

## 2025-01-15 DIAGNOSIS — Z8673 Personal history of transient ischemic attack (TIA), and cerebral infarction without residual deficits: Secondary | ICD-10-CM

## 2025-01-15 DIAGNOSIS — Z981 Arthrodesis status: Secondary | ICD-10-CM

## 2025-01-15 DIAGNOSIS — Z8249 Family history of ischemic heart disease and other diseases of the circulatory system: Secondary | ICD-10-CM

## 2025-01-15 DIAGNOSIS — M810 Age-related osteoporosis without current pathological fracture: Secondary | ICD-10-CM | POA: Diagnosis present

## 2025-01-15 DIAGNOSIS — Z96643 Presence of artificial hip joint, bilateral: Secondary | ICD-10-CM | POA: Diagnosis present

## 2025-01-15 DIAGNOSIS — Z79899 Other long term (current) drug therapy: Secondary | ICD-10-CM

## 2025-01-15 DIAGNOSIS — Z880 Allergy status to penicillin: Secondary | ICD-10-CM

## 2025-01-15 DIAGNOSIS — Z7982 Long term (current) use of aspirin: Secondary | ICD-10-CM

## 2025-01-15 DIAGNOSIS — M8458XA Pathological fracture in neoplastic disease, other specified site, initial encounter for fracture: Secondary | ICD-10-CM | POA: Diagnosis present

## 2025-01-15 DIAGNOSIS — Z1152 Encounter for screening for COVID-19: Secondary | ICD-10-CM

## 2025-01-15 DIAGNOSIS — I2510 Atherosclerotic heart disease of native coronary artery without angina pectoris: Secondary | ICD-10-CM | POA: Diagnosis present

## 2025-01-15 DIAGNOSIS — E785 Hyperlipidemia, unspecified: Secondary | ICD-10-CM | POA: Diagnosis present

## 2025-01-15 DIAGNOSIS — C3412 Malignant neoplasm of upper lobe, left bronchus or lung: Secondary | ICD-10-CM | POA: Diagnosis present

## 2025-01-15 DIAGNOSIS — J9859 Other diseases of mediastinum, not elsewhere classified: Secondary | ICD-10-CM

## 2025-01-15 DIAGNOSIS — R0902 Hypoxemia: Principal | ICD-10-CM

## 2025-01-15 DIAGNOSIS — Z7951 Long term (current) use of inhaled steroids: Secondary | ICD-10-CM

## 2025-01-15 DIAGNOSIS — F17210 Nicotine dependence, cigarettes, uncomplicated: Secondary | ICD-10-CM | POA: Diagnosis present

## 2025-01-15 DIAGNOSIS — G8929 Other chronic pain: Secondary | ICD-10-CM | POA: Diagnosis present

## 2025-01-15 DIAGNOSIS — K219 Gastro-esophageal reflux disease without esophagitis: Secondary | ICD-10-CM | POA: Diagnosis present

## 2025-01-15 DIAGNOSIS — I10 Essential (primary) hypertension: Secondary | ICD-10-CM | POA: Diagnosis present

## 2025-01-15 DIAGNOSIS — J9621 Acute and chronic respiratory failure with hypoxia: Principal | ICD-10-CM | POA: Diagnosis present

## 2025-01-15 DIAGNOSIS — M549 Dorsalgia, unspecified: Secondary | ICD-10-CM | POA: Diagnosis present

## 2025-01-15 DIAGNOSIS — Z79891 Long term (current) use of opiate analgesic: Secondary | ICD-10-CM

## 2025-01-15 DIAGNOSIS — Z823 Family history of stroke: Secondary | ICD-10-CM

## 2025-01-15 LAB — CBC WITH DIFFERENTIAL/PLATELET
Abs Immature Granulocytes: 0.11 K/uL — ABNORMAL HIGH (ref 0.00–0.07)
Basophils Absolute: 0 K/uL (ref 0.0–0.1)
Basophils Relative: 0 %
Eosinophils Absolute: 0.1 K/uL (ref 0.0–0.5)
Eosinophils Relative: 0 %
HCT: 37.2 % (ref 36.0–46.0)
Hemoglobin: 12.8 g/dL (ref 12.0–15.0)
Immature Granulocytes: 1 %
Lymphocytes Relative: 14 %
Lymphs Abs: 2 K/uL (ref 0.7–4.0)
MCH: 33.1 pg (ref 26.0–34.0)
MCHC: 34.4 g/dL (ref 30.0–36.0)
MCV: 96.1 fL (ref 80.0–100.0)
Monocytes Absolute: 1 K/uL (ref 0.1–1.0)
Monocytes Relative: 7 %
Neutro Abs: 10.9 K/uL — ABNORMAL HIGH (ref 1.7–7.7)
Neutrophils Relative %: 78 %
Platelets: 559 K/uL — ABNORMAL HIGH (ref 150–400)
RBC: 3.87 MIL/uL (ref 3.87–5.11)
RDW: 12.1 % (ref 11.5–15.5)
WBC: 14 K/uL — ABNORMAL HIGH (ref 4.0–10.5)
nRBC: 0 % (ref 0.0–0.2)

## 2025-01-15 LAB — RESP PANEL BY RT-PCR (RSV, FLU A&B, COVID)  RVPGX2
Influenza A by PCR: NEGATIVE
Influenza B by PCR: NEGATIVE
Resp Syncytial Virus by PCR: NEGATIVE
SARS Coronavirus 2 by RT PCR: NEGATIVE

## 2025-01-15 LAB — BASIC METABOLIC PANEL WITH GFR
Anion gap: 12 (ref 5–15)
BUN: 9 mg/dL (ref 8–23)
CO2: 23 mmol/L (ref 22–32)
Calcium: 9.1 mg/dL (ref 8.9–10.3)
Chloride: 96 mmol/L — ABNORMAL LOW (ref 98–111)
Creatinine, Ser: 0.99 mg/dL (ref 0.44–1.00)
GFR, Estimated: 60 mL/min — ABNORMAL LOW
Glucose, Bld: 129 mg/dL — ABNORMAL HIGH (ref 70–99)
Potassium: 4.1 mmol/L (ref 3.5–5.1)
Sodium: 131 mmol/L — ABNORMAL LOW (ref 135–145)

## 2025-01-15 MED ORDER — OXYCODONE-ACETAMINOPHEN 5-325 MG PO TABS: 1.0000 | ORAL_TABLET | Freq: Four times a day (QID) | ORAL | Status: DC | PRN

## 2025-01-15 MED ORDER — SODIUM CHLORIDE 0.9 % IV SOLN
500.0000 mg | INTRAVENOUS | Status: AC
  Administered 2025-01-15: 500 mg via INTRAVENOUS
  Filled 2025-01-15: qty 5

## 2025-01-15 MED ORDER — ADULT MULTIVITAMIN W/MINERALS CH
1.0000 | ORAL_TABLET | Freq: Every day | ORAL | Status: DC
  Administered 2025-01-16: 1 via ORAL
  Filled 2025-01-15: qty 1

## 2025-01-15 MED ORDER — OXYCODONE-ACETAMINOPHEN 10-325 MG PO TABS: 1.0000 | ORAL_TABLET | Freq: Four times a day (QID) | ORAL | Status: DC | PRN

## 2025-01-15 MED ORDER — ACETAMINOPHEN 325 MG PO TABS: 650.0000 mg | ORAL_TABLET | Freq: Four times a day (QID) | ORAL | Status: DC | PRN

## 2025-01-15 MED ORDER — ARIPIPRAZOLE 2 MG PO TABS
2.0000 mg | ORAL_TABLET | Freq: Every morning | ORAL | Status: DC
  Administered 2025-01-16: 2 mg via ORAL
  Filled 2025-01-15: qty 1

## 2025-01-15 MED ORDER — ALBUTEROL SULFATE (2.5 MG/3ML) 0.083% IN NEBU: 2.5000 mg | INHALATION_SOLUTION | RESPIRATORY_TRACT | Status: DC | PRN

## 2025-01-15 MED ORDER — IPRATROPIUM-ALBUTEROL 0.5-2.5 (3) MG/3ML IN SOLN
3.0000 mL | Freq: Once | RESPIRATORY_TRACT | Status: AC
  Administered 2025-01-15: 3 mL via RESPIRATORY_TRACT
  Filled 2025-01-15: qty 3

## 2025-01-15 MED ORDER — AZITHROMYCIN 250 MG PO TABS: 500.0000 mg | ORAL_TABLET | Freq: Every day | ORAL | Status: DC

## 2025-01-15 MED ORDER — IOHEXOL 350 MG/ML SOLN
75.0000 mL | Freq: Once | INTRAVENOUS | Status: AC | PRN
  Administered 2025-01-15: 75 mL via INTRAVENOUS

## 2025-01-15 MED ORDER — NICOTINE 7 MG/24HR TD PT24
7.0000 mg | MEDICATED_PATCH | Freq: Every day | TRANSDERMAL | Status: DC
  Administered 2025-01-15 – 2025-01-16 (×2): 7 mg via TRANSDERMAL
  Filled 2025-01-15 (×2): qty 1

## 2025-01-15 MED ORDER — BUDESON-GLYCOPYRROL-FORMOTEROL 160-9-4.8 MCG/ACT IN AERO: 1.0000 | INHALATION_SPRAY | Freq: Every day | RESPIRATORY_TRACT | Status: DC | PRN

## 2025-01-15 MED ORDER — ENOXAPARIN SODIUM 40 MG/0.4ML IJ SOSY
40.0000 mg | PREFILLED_SYRINGE | INTRAMUSCULAR | Status: DC
  Administered 2025-01-15: 40 mg via SUBCUTANEOUS
  Filled 2025-01-15: qty 0.4

## 2025-01-15 MED ORDER — BUPROPION HCL ER (XL) 150 MG PO TB24
150.0000 mg | ORAL_TABLET | Freq: Every morning | ORAL | Status: DC
  Administered 2025-01-16: 150 mg via ORAL
  Filled 2025-01-15: qty 1

## 2025-01-15 MED ORDER — GUAIFENESIN ER 600 MG PO TB12
600.0000 mg | ORAL_TABLET | Freq: Two times a day (BID) | ORAL | Status: DC
  Administered 2025-01-15 – 2025-01-16 (×2): 600 mg via ORAL
  Filled 2025-01-15 (×2): qty 1

## 2025-01-15 MED ORDER — ASPIRIN-ACETAMINOPHEN-CAFFEINE 250-250-65 MG PO TABS: 2.0000 | ORAL_TABLET | Freq: Four times a day (QID) | ORAL | Status: DC | PRN

## 2025-01-15 MED ORDER — DIAZEPAM 5 MG PO TABS: 10.0000 mg | ORAL_TABLET | Freq: Every evening | ORAL | Status: DC | PRN

## 2025-01-15 MED ORDER — PRAZOSIN HCL 1 MG PO CAPS: 1.0000 mg | ORAL_CAPSULE | Freq: Every evening | ORAL | Status: DC | PRN

## 2025-01-15 MED ORDER — BRIMONIDINE TARTRATE 0.025 % OP SOLN: 1.0000 [drp] | Freq: Every day | OPHTHALMIC | Status: DC | PRN

## 2025-01-15 MED ORDER — METHYLPREDNISOLONE SODIUM SUCC 125 MG IJ SOLR
125.0000 mg | Freq: Once | INTRAMUSCULAR | Status: AC
  Administered 2025-01-15: 125 mg via INTRAVENOUS
  Filled 2025-01-15: qty 2

## 2025-01-15 MED ORDER — ACETAMINOPHEN 650 MG RE SUPP: 650.0000 mg | Freq: Four times a day (QID) | RECTAL | Status: DC | PRN

## 2025-01-15 MED ORDER — VENLAFAXINE HCL ER 75 MG PO CP24
75.0000 mg | ORAL_CAPSULE | Freq: Every day | ORAL | Status: DC
  Administered 2025-01-16: 75 mg via ORAL
  Filled 2025-01-15: qty 1

## 2025-01-15 MED ORDER — BISOPROLOL FUMARATE 5 MG PO TABS
5.0000 mg | ORAL_TABLET | Freq: Every day | ORAL | Status: DC
  Administered 2025-01-16: 5 mg via ORAL
  Filled 2025-01-15: qty 1

## 2025-01-15 MED ORDER — ONDANSETRON HCL 4 MG/2ML IJ SOLN: 4.0000 mg | Freq: Four times a day (QID) | INTRAMUSCULAR | Status: DC | PRN

## 2025-01-15 MED ORDER — OXYCODONE HCL 5 MG PO TABS: 5.0000 mg | ORAL_TABLET | Freq: Four times a day (QID) | ORAL | Status: DC | PRN

## 2025-01-15 MED ORDER — LISINOPRIL 10 MG PO TABS
10.0000 mg | ORAL_TABLET | Freq: Every day | ORAL | Status: DC
  Administered 2025-01-15 – 2025-01-16 (×2): 10 mg via ORAL
  Filled 2025-01-15 (×2): qty 1

## 2025-01-15 MED ORDER — ONDANSETRON HCL 4 MG PO TABS: 4.0000 mg | ORAL_TABLET | Freq: Four times a day (QID) | ORAL | Status: DC | PRN

## 2025-01-15 MED ORDER — AMLODIPINE BESYLATE 5 MG PO TABS
10.0000 mg | ORAL_TABLET | Freq: Every day | ORAL | Status: DC
  Administered 2025-01-16: 10 mg via ORAL
  Filled 2025-01-15: qty 2

## 2025-01-15 MED ORDER — PANTOPRAZOLE SODIUM 40 MG PO TBEC: 40.0000 mg | DELAYED_RELEASE_TABLET | Freq: Two times a day (BID) | ORAL | Status: DC | PRN

## 2025-01-15 NOTE — Progress Notes (Signed)
 [facility] Pulmonary, Critical Care and Sleep Medicine   Name: Brenda Carney MRN: [id number] DOB: July 14, 1951 Primary care provider: Leita Blind, MD  Chief Complaint  Patient presents with   Follow-up    Breathing has been worse    Summary  74 y.o. female smoker with COPD from emphysema.  Subjective   She was diagnosed with Influenza A and pneumonia on 12/22/24.  She was treated as an outpatient with antibiotics.  She hasn't felt well since then.  She normal was only using her supplemental oxygen  at night.  She has been using her oxygen  at 6 liters 24/7 now.  On arrival to the office today her SpO2 on 6 liters was in the mid-70's.  She felt weak and ashen.  She was transitioned to our oxygen  tanks at 8 liters and her SpO2 only got up to 87 to 89%.  Her heart rate remained above 120.  She has been feeling tight in her chest and has cough with some phlegm.  Past Medical History  HTN, CAD, Depression, PAD, HLD, Chronic pain, PUD, GERD, Osteoporosis, TIA, Arthritis, Headache, Rt ophthalmic artery aneurysm  Past Surgical History  She  has a past surgical history that includes Closed reduction AC joint; Lumbar fusion; Femoral artery - popliteal artery bypass graft (Right); Total hip arthroplasty (Bilateral); Femoral endarterectomy (Left); and Iliac artery stent (Right).  Social History  She  reports that she has been smoking cigarettes. She has never used smokeless tobacco. She reports current alcohol  use of about 2.0 standard drinks of alcohol  per week. She reports that she does not use drugs.  Family History  Her family history includes Heart attack in her mother; Stroke in her father and mother.    Vital Signs  BP 124/80   Pulse (!) 126   Temp 98.1 F (36.7 C) (Oral)   Resp 18   Ht 1.651 m (5' 5)   Wt 55.3 kg (122 lb)   SpO2 (!) 86% Comment: 8L- provider is aware  BMI 20.30 kg/m   Physical Exam   Constitutional - was using accessory muscles when  walking to exam room, wearing oxygen  HEENT - pupils reactive, no sinus drainage, no oral exudate, no LAN Cardiac - regular, tachycardic Respiratory - poor air movement, bilateral expiratory wheezing Gastrointestinal - soft, non tender, bowel sounds present Musculoskeletal - no deformities noted Skin - warm, dry, no rashes Neurologic - alert and oriented, no focal deficits  Pulmonary Tests  Spirometry 04/29/24 >> FEV1 1.50 (65%), FEV1% 55  Sleep Tests  ONO with RA 07/25/24 >> test time 1 hr 56 min. Basal SpO2 92.2%, low SpO2 83%. Spent 5 min and 9 sec with SpO2 < 88%.     Chest Imaging  CT angio chest 06/17/19 >> centrilobular emphysema, lower lobe BTX  Cardiac Tests      Assessment/Plan   Acute on chronic hypoxic respiratory failure. - in setting of recent Influenza A pneumonia and COPD exacerbation - given the severity of her current illness recommended that she be taken to the closest hospital by EMS - EMS contacted and will transport patient to hospital  Patient Instructions   Patient Instructions  EMS called to transport to nearest hospital.  Allergies and Medications   Allergies  Allergen Reactions   Penicillins Other (See Comments) and Swelling    TONGUE SWELLING   Statins-Hmg-Coa Reductase Inhibitors     muscle pain   Tapentadol Other (See Comments)    Dry mouth   Pregabalin Other (  See Comments)    DRY MOUTH      Medication List       * Accurate as of January 15, 2025  3:54 PM. If you have any questions, ask your nurse or doctor.          CONTINUE taking these medications    ALPRAZolam  1 mg tablet Commonly known as: XANAX    amLODIPine  10 mg tablet Commonly known as: NORVASC    ARIPiprazole  2 mg tablet Commonly known as: ABILIFY    aspirin  81 mg EC tablet Take 81 mg by mouth every morning.   bisoprolol  5 mg tablet Commonly known as: ZEBETA  Take 5 mg by mouth daily.   buPROPion  150 mg 24 hr tablet Commonly known as: WELLBUTRIN  XL    chlorhexidine  0.12 % solution Commonly known as: PERIDEX    desvenlafaxine  100 mg 24 hr tablet Commonly known as: PRISTIQ    diazePAM  10 mg tablet Commonly known as: VALIUM  Take 10 mg by mouth.   fenofibrate  nanocrystallized 145 mg tablet Commonly known as: TRICOR    fluticasone  propionate 50 mcg/spray nasal spray Commonly known as: FLONASE    lisinopril  10 mg tablet Commonly known as: PRINIVIL    Lumify  0.025 % Drop Generic drug: brimonidine  Administer 1 drop into affected eye(s).   prazosin  1 mg capsule Commonly known as: MINIPRESS    traZODone  50 mg tablet Commonly known as: DESYREL  Take 50 mg by mouth nightly.   umeclidinium-vilanterol 62.5-25 mcg/actuation Dsdv Commonly known as: ANORO ELLIPTA  Inhale 1 puff once daily.        Time Spent  On the day of the visit I spent 45 minutes preparing to see the patient, obtaining and/or reviewing separately obtained history, performing a medically appropriate examination and evaluation, counseling and educating the patient/caregiver, and documenting clinical information in the electronic medical record.This time does not include any time spent performing procedures or assesments that are separately billable.    Signature  Carolynne Allan, MD 01/15/2025, 3:54 PM  [address]   [address]   [address] [place], [place] [address] [place], [place] [address]  [place], [place] [address] [phone]  [phone]  [phone]  *Some images could not be shown.

## 2025-01-15 NOTE — ED Triage Notes (Signed)
 Pt BIB ems for SOB since 3 days now. Pt was diagnosed with pneumonia and just completed her antibiotics 3 days ago, since then has been having increased SOB. Usually on 5L home oxygen . Hx of COPD, HTN. A&Ox4.

## 2025-01-15 NOTE — ED Provider Notes (Signed)
 " [place] EMERGENCY DEPARTMENT AT [facility] Provider Note  CSN: [id number] Arrival date & time: 01/15/25 1643  Chief Complaint(s) Shortness of Breath  HPI Brenda Carney is a 74 y.o. female who is here today for shortness of breath.  Patient sent from her pulmonologist office.  She has a smoking history, but reports that she does not have a formal diagnosis of COPD.  She states that her PCP put her on oxygen  starting about 3 to 4 weeks ago for evening use.  At that time she was diagnosed with influenza, pneumonia, finished antibiotics.  She went to her pulmonologist a follow-up today, had to increase her oxygen  to 5 to 6 L 24/7.   Past Medical History Past Medical History:  Diagnosis Date   Arthritis    thumbs; hips (05/14/2017)   CAD S/P 2 V PCI (pRCA, PCx-OM)    Coronary calcium  scoring: June 2018: Score 1106. 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   Cervical spondylosis    Chronic lower back pain    COPD (chronic obstructive pulmonary disease) (HCC)    little bit (05/14/2017)   Depression    Exercise-induced asthma with acute exacerbation    only in very hot weather (05/14/2017)   GERD (gastroesophageal reflux disease)    History of IBS    resovlved   Hx of multiple concussions    from horse training and riding   Hyperlipidemia with target LDL less than 70    Mostly statin intolerant,. Currently on Livalo  4 mg   Hypertension, benign    Lumbosacral spondylosis    PAD (peripheral artery disease)    Status post right above-the-knee-below the knee popliteal artery bypass; postop right ABI 0.96.   Pneumonia 2020   Sleep apnea    does not wear CPAP; think it was medication related (05/14/2017)    Stroke [facility]) 2010   TIA   Thoracic aortic ectasia    ~40 mm on Coronary Calcium  Score CT - recommend f/u CTA or MRA   TIA (transient ischemic attack) 09/2011   several   Patient Active Problem List   Diagnosis Date Noted   Light smoker 08/06/2024   Acute respiratory failure with hypoxia (HCC) 03/07/2024   Influenza A 03/07/2024   Statin myopathy 12/25/2022   Osteoporosis 05/01/2022   Atrophic vaginitis 02/06/2022   Diverticular disease of colon 02/06/2022   Duodenitis 02/06/2022   Gastroesophageal reflux disease with esophagitis 02/06/2022   Hx of aorto-femoral bypass 02/06/2022   Iron deficiency anemia due to chronic blood loss 02/06/2022   Lower abdominal pain 02/06/2022   Peptic ulcer disease 02/06/2022   Right hip pain 02/06/2022   Chronic pain syndrome 02/06/2022   Chronic obstructive pulmonary disease with (acute) exacerbation (HCC) 02/06/2022   Chronic obstructive pulmonary disease (HCC) 02/06/2022   Major depression 02/06/2022   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   Gastritis 02/06/2022   Gastro-esophageal reflux 02/06/2022   Gingivitis 02/06/2022   Transient ischemic attack 02/06/2022   Screening for breast cancer 02/06/2022   CAD S/P percutaneous coronary angioplasty 09/30/2021   DOE (dyspnea on exertion) 09/02/2021   Thrombocytosis 06/23/2021   Neck pain 10/12/2020   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   Avascular necrosis of bone of left  hip (HCC) 04/15/2020   Status post total replacement of right hip 01/16/2020   Avascular necrosis of bone of right hip (HCC) 01/06/2020   CAP (community acquired pneumonia) 06/17/2019   Leukocytosis 06/17/2019   Hematemesis 04/29/2018   AKI (acute kidney injury) 04/29/2018   Thoracic aortic ectasia    Sleep apnea    Pneumonia    Migraine headache    Lumbosacral spondylosis    History of kidney stones    History of IBS    GERD (gastroesophageal reflux disease)    Exercise-induced asthma with acute  exacerbation    Depression    COPD with acute exacerbation (HCC)    Chronic lower back pain    Cervical spondylosis    Cerebral aneurysm    Arthritis    Anemia    Abnormal findings diagnostic imaging of heart and coronary circulation 06/28/2017   Critical lower limb ischemia (HCC) 05/14/2017   Preoperative cardiovascular examination    PAD (peripheral artery disease)    S/P lumbar spinal fusion 09/30/2014   Essential hypertension    Hyperlipidemia with target LDL less than 70    Cerebrovascular disease    Headache 10/07/2013   Home Medication(s) Prior to Admission medications  Medication Sig Start Date End Date Taking? Authorizing Provider  albuterol  (VENTOLIN  HFA) 108 (90 Base) MCG/ACT inhaler Inhale 2 puffs into the lungs every 6 (six) hours as needed for shortness of breath. 03/08/24   Amin, Ankit C, MD  amLODipine  (NORVASC ) 10 MG tablet Take 10 mg by mouth in the morning.    [provider]  Apoaequorin (PREVAGEN) 10 MG CAPS Take 10 mg by mouth daily.    [provider]  ARIPiprazole  (ABILIFY ) 2 MG tablet Take 2 mg by mouth every morning.    [provider]  aspirin  EC 81 MG tablet Take 81 mg by mouth in the morning.    [provider]  aspirin -acetaminophen -caffeine  (EXCEDRIN  EXTRA STRENGTH) 250-250-65 MG tablet Take 2 tablets by mouth every 6 (six) hours as needed for headache.    [provider]  bisoprolol  (ZEBETA ) 5 MG tablet Take 1 tablet (5 mg total) by mouth daily. 08/15/24   Anner Alm ORN, MD  Brimonidine  Tartrate (LUMIFY ) 0.025 % SOLN Place 1 drop into both eyes daily as needed (redness).    [provider]  Budeson-Glycopyrrol-Formoterol  (BREZTRI AEROSPHERE) 160-9-4.8 MCG/ACT AERO Inhale 1 puff into the lungs daily as needed (shortness of breath).    [provider]  buPROPion  (WELLBUTRIN  XL) 150 MG 24 hr tablet Take 150 mg by mouth every morning. 08/20/22   [provider]  clopidogrel  (PLAVIX ) 75  MG tablet Take 1 tablet (75 mg total) by mouth daily. 12/16/24   Gretta Lonni PARAS, MD  desvenlafaxine  (PRISTIQ ) 50 MG 24 hr tablet Take 50 mg by mouth in the morning.    [provider]  diazepam  (VALIUM ) 10 MG tablet Take 10 mg by mouth at bedtime as needed for sleep or anxiety.    [provider]  Ferrous Sulfate  (IRON HIGH-POTENCY) 45 MG TBCR Take 45 mg by mouth daily as needed (Low iron). Slow iron    [provider]  lisinopril  (ZESTRIL ) 10 MG tablet Take 10 mg by mouth in the morning. 03/22/20   [provider]  Multiple Vitamins-Minerals (CENTRUM SILVER 50+WOMEN) TABS Take 1 tablet by mouth daily.    [provider]  naphazoline-pheniramine (VISINE) 0.025-0.3 % ophthalmic solution Place 1 drop into both eyes 4 (four) times daily as  needed (dry/irritated eyes.).    [provider]  oxyCODONE -acetaminophen  (PERCOCET) 10-325 MG tablet Take 1 tablet by mouth every 6 (six) hours as needed for pain. 12/26/22   [provider]  pantoprazole  (PROTONIX ) 40 MG tablet Take 40 mg by mouth 2 (two) times daily as needed (acid reflux). 10/02/23   [provider]  prazosin  (MINIPRESS ) 1 MG capsule Take 1 mg by mouth at bedtime as needed (Sleep).    [provider]  Propylhexedrine (BENZEDREX NA) Place 1 spray into the nose 2 (two) times daily as needed (nasal congestion.).    [provider]  traZODone  (DESYREL ) 50 MG tablet Take 50 mg by mouth at bedtime as needed for sleep.    [provider]                                                                                                                                    Past Surgical History Past Surgical History:  Procedure Laterality Date   ABDOMINAL AORTOGRAM N/A 11/06/2024   Procedure: ABDOMINAL AORTOGRAM;  Surgeon: Gretta Lonni PARAS, MD;  Location: [facility] INVASIVE CV LAB;  Service: Cardiovascular;  Laterality: N/A;   ABDOMINAL AORTOGRAM W/LOWER  EXTREMITY N/A 05/14/2017   Procedure: Abdominal Aortogram w/Lower Extremity;  Surgeon: Laurence Redell CROME, MD;  Location: [facility] INVASIVE CV LAB;  Service: Cardiovascular;  Laterality: N/A;   ABDOMINAL AORTOGRAM W/LOWER EXTREMITY N/A 08/31/2022   Procedure: ABDOMINAL AORTOGRAM W/LOWER EXTREMITY;  Surgeon: Gretta Lonni PARAS, MD;  Location: [facility] INVASIVE CV LAB;  Service: Cardiovascular;  Laterality: N/A;   ABDOMINAL AORTOGRAM W/LOWER EXTREMITY N/A 12/07/2022   Procedure: ABDOMINAL AORTOGRAM W/LOWER EXTREMITY;  Surgeon: Gretta Lonni PARAS, MD;  Location: [facility] INVASIVE CV LAB;  Service: Cardiovascular;  Laterality: N/A;   ABDOMINAL AORTOGRAM W/LOWER EXTREMITY Right 10/25/2023   Procedure: ABDOMINAL AORTOGRAM W/LOWER EXTREMITY;  Surgeon: Gretta Lonni PARAS, MD;  Location: [facility] INVASIVE CV LAB;  Service: Cardiovascular;  Laterality: Right;   ACROMIO-CLAVICULAR JOINT REPAIR Left 1990s   I think it was called an [facility] joint removal   BACK SURGERY     BYPASS GRAFT POPLITEAL TO POPLITEAL Right 05/15/2017   Procedure: BYPASS GRAFT ABOVE KNEE POPLITEAL TO BELOW KNEE POPLITEAL ARTERY;  Surgeon: Laurence Redell CROME, MD;  Location: [facility] OR;  Service: Vascular;  Laterality: Right;   CARDIAC CATHETERIZATION     CORONARY STENT INTERVENTION N/A 09/30/2021   Procedure: CORONARY STENT INTERVENTION;  Surgeon: Anner Alm ORN, MD;  Location: [facility] INVASIVE CV LAB;  Service: Cardiovascular;  Laterality: N/A;   CORONARY ULTRASOUND/IVUS N/A 09/30/2021   Procedure: Intravascular Ultrasound/IVUS;  Surgeon: Anner Alm ORN, MD;  Location: [facility] INVASIVE CV LAB;  Service: Cardiovascular;  Laterality: N/A;   ENDARTERECTOMY FEMORAL Left 09/11/2022   Procedure: LEFT COMMON FEMORAL ENDARTERECTOMY;  Surgeon: Gretta Lonni PARAS, MD;  Location: [facility] OR;  Service: Vascular;  Laterality: Left;   ESOPHAGOGASTRODUODENOSCOPY (EGD) WITH PROPOFOL  N/A 04/29/2018   Procedure: ESOPHAGOGASTRODUODENOSCOPY (  EGD) WITH PROPOFOL ;  Surgeon: Elicia Claw, MD;   Location: [facility] ENDOSCOPY;  Service: Gastroenterology;  Laterality: N/A;   FOOT FRACTURE SURGERY Bilateral    multiple procedures   FRACTURE SURGERY     JOINT REPLACEMENT     LEFT HEART CATH AND CORONARY ANGIOGRAPHY N/A 09/30/2021   Procedure: LEFT HEART CATH AND CORONARY ANGIOGRAPHY;  Surgeon: Anner Alm ORN, MD;  Location: [facility] INVASIVE CV LAB;  Service: Cardiovascular;  Laterality: N/A;   LOWER EXTREMITY ANGIOGRAPHY N/A 06/16/2020   Procedure: LOWER EXTREMITY ANGIOGRAPHY;  Surgeon: Gretta Lonni PARAS, MD;  Location: [facility] INVASIVE CV LAB;  Service: Cardiovascular;  Laterality: N/A;   LOWER EXTREMITY ANGIOGRAPHY N/A 11/06/2024   Procedure: Lower Extremity Angiography;  Surgeon: Gretta Lonni PARAS, MD;  Location: [facility] INVASIVE CV LAB;  Service: Cardiovascular;  Laterality: N/A;   LOWER EXTREMITY INTERVENTION N/A 11/06/2024   Procedure: LOWER EXTREMITY INTERVENTION;  Surgeon: Gretta Lonni PARAS, MD;  Location: [facility] INVASIVE CV LAB;  Service: Cardiovascular;  Laterality: N/A;   MAXIMUM ACCESS (MAS)POSTERIOR LUMBAR INTERBODY FUSION (PLIF) 1 LEVEL N/A 09/30/2014   Procedure: FOR MAXIMUM ACCESS (MAS) POSTERIOR LUMBAR INTERBODY FUSION (PLIF) 1 LEVEL;  Surgeon: Alm GORMAN Molt, MD;  Location: [facility] NEURO ORS;  Service: Neurosurgery;  Laterality: N/A;  FOR MAXIMUM ACCESS (MAS) POSTERIOR LUMBAR INTERBODY FUSION (PLIF) 1 LEVEL LUMBAR 4-5   MULTIPLE TOOTH EXTRACTIONS Right 04/2017   NM MYOVIEW  LTD  06/2017    Normal EF, 66%. No ST changes. Normal study. LOW RISK.   PATCH ANGIOPLASTY Left 09/11/2022   Procedure: LEFT FEMORAL PATCH ANGIOPLASTY USING XENOSURE BIOLOGIC PATCH 1CMX14CM;  Surgeon: Gretta Lonni PARAS, MD;  Location: [facility] OR;  Service: Vascular;  Laterality: Left;   PERIPHERAL VASCULAR ATHERECTOMY Left 06/16/2020   Procedure: PERIPHERAL VASCULAR ATHERECTOMY;  Surgeon: Gretta Lonni PARAS, MD;  Location: [facility] INVASIVE CV LAB;  Service: Cardiovascular;  Laterality: Left;  SFA   PERIPHERAL VASCULAR ATHERECTOMY  Left 12/07/2022   Procedure: PERIPHERAL VASCULAR ATHERECTOMY;  Surgeon: Gretta Lonni PARAS, MD;  Location: [facility] INVASIVE CV LAB;  Service: Cardiovascular;  Laterality: Left;   PERIPHERAL VASCULAR INTERVENTION Right 08/31/2022   Procedure: PERIPHERAL VASCULAR INTERVENTION;  Surgeon: Gretta Lonni PARAS, MD;  Location: [facility] INVASIVE CV LAB;  Service: Cardiovascular;  Laterality: Right;   PERIPHERAL VASCULAR INTERVENTION  12/07/2022   Procedure: PERIPHERAL VASCULAR INTERVENTION;  Surgeon: Gretta Lonni PARAS, MD;  Location: [facility] INVASIVE CV LAB;  Service: Cardiovascular;;   PERIPHERAL VASCULAR INTERVENTION Right 10/25/2023   Procedure: PERIPHERAL VASCULAR INTERVENTION;  Surgeon: Gretta Lonni PARAS, MD;  Location: [facility] INVASIVE CV LAB;  Service: Cardiovascular;  Laterality: Right;  right external iliac   TOTAL HIP ARTHROPLASTY Right 01/16/2020   Procedure: RIGHT TOTAL HIP ARTHROPLASTY ANTERIOR APPROACH;  Surgeon: Vernetta Lonni GRADE, MD;  Location: [facility] ORS;  Service: Orthopedics;  Laterality: Right;   TOTAL HIP ARTHROPLASTY Left 04/30/2020   Procedure: LEFT TOTAL HIP ARTHROPLASTY ANTERIOR APPROACH;  Surgeon: Vernetta Lonni GRADE, MD;  Location: [facility] ORS;  Service: Orthopedics;  Laterality: Left;   TRANSTHORACIC ECHOCARDIOGRAM  09/07/2013   Done to evaluate TIA: Normal LV size and function. EF 55-60%. GR 1 DD. Mild left atrial dilation. Mildly calcified mitral leaflets. Otherwise normal.   Family History Family History  Problem Relation Age of Onset   Stroke Father    Heart attack Mother    Stroke Mother     Social History Social History[1] Allergies Penicillins, Nucynta er [tapentadol hcl er], Statins, and Lyrica [pregabalin]  Review of Systems Review of Systems  Physical Exam Vital Signs  I have reviewed the triage vital signs BP (!) 145/95 (BP Location: Right Arm)   Pulse (!) 109   Temp 98.1 F (36.7 C) (Oral)   Resp (!) 22   Ht 5' 5 (1.651 m)   Wt 55.3 kg   SpO2 96%   BMI  20.30 kg/m   Physical Exam Vitals and nursing note reviewed.  Pulmonary:     Effort: Pulmonary effort is normal.     Breath sounds: Wheezing and rhonchi present. No rales.  Chest:     Chest wall: No mass, tenderness or edema.  Musculoskeletal:     Cervical back: Normal range of motion.  Skin:    General: Skin is warm.  Neurological:     General: No focal deficit present.     Mental Status: She is alert.     ED Results and Treatments Labs (all labs ordered are listed, but only abnormal results are displayed) Labs Reviewed  CBC WITH DIFFERENTIAL/PLATELET - Abnormal; Notable for the following components:      Result Value   WBC 14.0 (*)    Platelets 559 (*)    Neutro Abs 10.9 (*)    Abs Immature Granulocytes 0.11 (*)    All other components within normal limits  BASIC METABOLIC PANEL WITH GFR - Abnormal; Notable for the following components:   Sodium 131 (*)    Chloride 96 (*)    Glucose, Bld 129 (*)    GFR, Estimated 60 (*)    All other components within normal limits  RESP PANEL BY RT-PCR (RSV, FLU A&B, COVID)  RVPGX2                                                                                                                          Radiology CT Angio Chest PE W and/or Wo Contrast Result Date: 01/15/2025 EXAM: CTA of the Chest without and with contrast for PE 01/15/2025 08:05:51 PM TECHNIQUE: CTA of the chest was performed after the administration of 75 mL of iohexol  (OMNIPAQUE ) 350 MG/ML injection. Multiplanar reformatted images are provided for review. MIP images are provided for review. Automated exposure control, iterative reconstruction, and/or weight based adjustment of the mA/kV was utilized to reduce the radiation dose to as low as reasonably achievable. COMPARISON: Chest x-ray today and CT 09/09/2021. CLINICAL HISTORY: Pulmonary embolism (PE) suspected, high prob. Pulmonary embolism suspected, high probability. FINDINGS: PULMONARY ARTERIES: Pulmonary arteries are  adequately opacified for evaluation. No pulmonary embolism. Main pulmonary artery is normal in caliber. MEDIASTINUM: The heart demonstrates diffuse coronary artery atherosclerosis. There is diffuse aortic atherosclerosis. LYMPH NODES: No mediastinal, hilar or axillary lymphadenopathy. LUNGS AND PLEURA: Moderate to severe centrilobular emphysema. There is an anterior left upper lobe mass abutting the mediastinal surface measuring 3.6 x 3.0 cm on image 64 of series 4. This is suspicious for malignancy. Mucous plugging in the lower lobe airways bilaterally. Bibasilar airspace opacities, likely atelectasis. No pleural effusion or pneumothorax. UPPER ABDOMEN:  Limited images of the upper abdomen are unremarkable. SOFT TISSUES AND BONES: No acute bone or soft tissue abnormality. IMPRESSION: 1. No pulmonary embolism. 2. Anterior left upper lobe mass abutting the mediastinal surface measuring 3.6 x 3.0 cm, suspicious for malignancy. Recommend thoracic surgical consultation. This could be further evaluated with PET CT or tissue sampling. 3. Moderate to advanced centrilobular emphysema. 4. Diffuse coronary artery disease, aortic atherosclerosis. 5. Mucous plugging in the lower lobes bilaterally with bibasilar atelectasis. Electronically signed by: Franky Crease MD 01/15/2025 08:14 PM EST RP Workstation: [id number]   DG Chest Portable 1 View Result Date: 01/15/2025 EXAM: 1 VIEW(S) XRAY OF THE CHEST 01/15/2025 05:31:00 PM COMPARISON: Comparison from 03/07/2024. CLINICAL HISTORY: Cough. FINDINGS: LUNGS AND PLEURA: Increased markings in the lung bases again noted, similar to prior study, likely bibasilar scarring or atelectasis. No acute confluent opacity is identified. No pleural effusion. No pneumothorax. HEART AND MEDIASTINUM: Aortic atherosclerosis is noted. No acute abnormality of the cardiac and mediastinal silhouettes. BONES AND SOFT TISSUES: No acute osseous abnormality. IMPRESSION: 1. No acute findings. 2. Increased  markings in the lung bases, similar to prior study, likely bibasilar scarring or atelectasis . Electronically signed by: Franky Crease MD 01/15/2025 05:40 PM EST RP Workstation: [id number]    Pertinent labs & imaging results that were available during my care of the patient were reviewed by me and considered in my medical decision making (see MDM for details).  Medications Ordered in ED Medications  ipratropium-albuterol  (DUONEB) 0.5-2.5 (3) MG/3ML nebulizer solution 3 mL (3 mLs Nebulization Given 01/15/25 1735)  methylPREDNISolone  sodium succinate (SOLU-MEDROL ) 125 mg/2 mL injection 125 mg (125 mg Intravenous Given 01/15/25 1745)  ipratropium-albuterol  (DUONEB) 0.5-2.5 (3) MG/3ML nebulizer solution 3 mL (3 mLs Nebulization Given 01/15/25 1950)  iohexol  (OMNIPAQUE ) 350 MG/ML injection 75 mL (75 mLs Intravenous Contrast Given 01/15/25 1959)                                                                                                                                     Procedures .Critical Care  Performed by: Mannie Fairy DASEN, DO Authorized by: Mannie Fairy DASEN, DO   Critical care provider statement:    Critical care time (minutes):  35   Critical care was necessary to treat or prevent imminent or life-threatening deterioration of the following conditions:  Respiratory failure   Critical care was time spent personally by me on the following activities:  Development of treatment plan with patient or surrogate, discussions with consultants, evaluation of patient's response to treatment, examination of patient, ordering and review of laboratory studies, ordering and review of radiographic studies, ordering and performing treatments and interventions, pulse oximetry, re-evaluation of patient's condition and review of old charts   (including critical care time)  Medical Decision Making / ED Course   This patient presents to the ED for concern of shortness of breath, this involves an extensive  number of treatment options, and  is a complaint that carries with it a high risk of complications and morbidity.  The differential diagnosis includes COPD exacerbation, pneumonia, PE, malignancy.  Less likely heart failure.  MDM: Obtained chest x-ray on the patient which did not show significant pneumonia.  Patient did not respond significantly to 1 DuoNeb, provided a second in addition to the earlier steroids.  She reported modest improvement.  Obtained CTA on the patient which did show a mediastinal mass.  Likely the cause of the patient's progressive shortness of breath over the last few weeks.  Patient is agreeable for admission.  Admitting diagnosis is hypoxia, likely secondary to COPD exacerbation, mediastinal mass.  Have placed hospitalist consult.   Additional history obtained: -Additional history obtained from EMS. -External records from outside source obtained and reviewed including: Chart review including previous notes, labs, imaging, consultation notes   Lab Tests: -I ordered, reviewed, and interpreted labs.   The pertinent results include:   Labs Reviewed  CBC WITH DIFFERENTIAL/PLATELET - Abnormal; Notable for the following components:      Result Value   WBC 14.0 (*)    Platelets 559 (*)    Neutro Abs 10.9 (*)    Abs Immature Granulocytes 0.11 (*)    All other components within normal limits  BASIC METABOLIC PANEL WITH GFR - Abnormal; Notable for the following components:   Sodium 131 (*)    Chloride 96 (*)    Glucose, Bld 129 (*)    GFR, Estimated 60 (*)    All other components within normal limits  RESP PANEL BY RT-PCR (RSV, FLU A&B, COVID)  RVPGX2      Imaging Studies ordered: I ordered imaging studies including chest x-ray, CTA chest I independently visualized and interpreted imaging. I agree with the radiologist interpretation   Medicines ordered and prescription drug management: Meds ordered this encounter  Medications   ipratropium-albuterol  (DUONEB)  0.5-2.5 (3) MG/3ML nebulizer solution 3 mL   methylPREDNISolone  sodium succinate (SOLU-MEDROL ) 125 mg/2 mL injection 125 mg    IV methylprednisolone  will be converted to either a q12h or q24h frequency with the same total daily dose (TDD).  Ordered Dose: 1 to 125 mg TDD; convert to: TDD q24h.  Ordered Dose: 126 to 250 mg TDD; convert to: TDD div q12h.  Ordered Dose: >250 mg TDD; DAW.   ipratropium-albuterol  (DUONEB) 0.5-2.5 (3) MG/3ML nebulizer solution 3 mL   iohexol  (OMNIPAQUE ) 350 MG/ML injection 75 mL    -I have reviewed the patients home medicines and have made adjustments as needed  Critical interventions Management of hypoxia  Cardiac Monitoring: The patient was maintained on a cardiac monitor.  I personally viewed and interpreted the cardiac monitored which showed an underlying rhythm of: Normal sinus rhythm     Reevaluation: After the interventions noted above, I reevaluated the patient and found that they have :improved  Co morbidities that complicate the patient evaluation  Past Medical History:  Diagnosis Date   Arthritis    thumbs; hips (05/14/2017)   CAD S/P 2 V PCI (pRCA, PCx-OM)    Coronary calcium  scoring: June 2018: Score 1106. 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   Cervical spondylosis    Chronic lower back pain    COPD (chronic obstructive pulmonary disease) (HCC)  little bit (05/14/2017)   Depression    Exercise-induced asthma with acute exacerbation    only in very hot weather (05/14/2017)   GERD (gastroesophageal reflux disease)    History of IBS    resovlved   Hx of multiple concussions    from horse training and riding   Hyperlipidemia with target LDL less than 70    Mostly  statin intolerant,. Currently on Livalo  4 mg   Hypertension, benign    Lumbosacral spondylosis    PAD (peripheral artery disease)    Status post right above-the-knee-below the knee popliteal artery bypass; postop right ABI 0.96.   Pneumonia 2020   Sleep apnea    does not wear CPAP; think it was medication related (05/14/2017)   Stroke [facility]) 2010   TIA   Thoracic aortic ectasia    ~40 mm on Coronary Calcium  Score CT - recommend f/u CTA or MRA   TIA (transient ischemic attack) 09/2011   several       Final Clinical Impression(s) / ED Diagnoses Final diagnoses:  Hypoxia  Mediastinal mass  COPD exacerbation (HCC)     @PCDICTATION @     [1]  Social History Tobacco Use   Smoking status: Some Days    Current packs/day: 0.00    Types: Cigarettes    Passive exposure: Never   Smokeless tobacco: Never   Tobacco comments:    [name] says she smokes one cigarette every couple of weeks. [name] says she has a lot of friends who still smoke  Vaping Use   Vaping status: Former  Substance Use Topics   Alcohol  use: Yes    Alcohol /week: 2.0 standard drinks of alcohol     Types: 2 Glasses of wine per week    Comment: wine   Drug use: No     Mannie Pac T, DO 01/15/25 2041  "

## 2025-01-15 NOTE — H&P (Signed)
 " History and Physical    Brenda Carney FMW:[id number] DOB: 12/18/1951 DOA: 01/15/2025  PCP: Stephane Leita DEL, MD  Patient coming from: Home via pulmonary office  I have personally briefly reviewed patient's old medical records available.   Chief Complaint: Worsening shortness of breath for the last 3 weeks  HPI: Brenda Carney is a 74 y.o. female with medical history significant of COPD, currently smoker about 3 cigarettes a day, previous heavy smoker, peripheral vascular disease status post femoropopliteal bypass currently on aspirin  and Plavix , hypertension, depression, hyperlipidemia, apnea not on CPAP who suffered from influenza A infection at the end of last year and treated with multiple rounds of steroids and antibiotics but continues to have shortness of breath.  She was recently seen at PCP office and started on oxygen  as needed, then she went into using oxygen  2 L all the time since last 2 weeks.  Went to [facility] pulmonary office today and found to be needing 5 L of oxygen  so sent to the ER. Patient endorses dyspnea on exertion, worsening and now is getting dyspneic with minimal activities. Dry cough, occasional yellow sputum but clear. No fever or chills. Denies any weight loss or weight gain.  Denies any hemoptysis or hematemesis.  Urine and bowel habits are normal.  Denies any claudication or leg pain. ED Course: Hemodynamically stable.  On 4 L oxygen .  WC count 14,000.  Renal functions are stable.  Underwent chest x-ray without much changes from before.  CT angiogram was done that shows no evidence of PE, shows some mucous plugging however showed significant findings of 3 x 3 cm left upper lobe mass extending to the mediastinum.  Patient was given 2 rounds of nebulizer with good clinical response.  On my exam, patient is comfortable, able to talk in complete sentences.  Admission requested due to significant findings, need for COPD stabilization and also to address the lung  tumor. Patient also reports T6 compression fracture that happened with minimal activity while lifting up her dog.  She has chronic T8 compression fracture.  She takes oxycodone  for that.  Review of Systems: all systems are reviewed and pertinent positive as per HPI otherwise rest are negative.    Past Medical History:  Diagnosis Date   Arthritis    thumbs; hips (05/14/2017)   CAD S/P 2 V PCI (pRCA, PCx-OM)    Coronary calcium  scoring: June 2018: Score 1106. 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   Cervical spondylosis    Chronic lower back pain    COPD (chronic obstructive pulmonary disease) (HCC)    little bit (05/14/2017)   Depression    Exercise-induced asthma with acute exacerbation    only in very hot weather (05/14/2017)   GERD (gastroesophageal reflux disease)    History of IBS    resovlved   Hx of multiple concussions    from horse training and riding   Hyperlipidemia with target LDL less than 70    Mostly statin intolerant,. Currently on Livalo  4 mg   Hypertension, benign    Lumbosacral spondylosis    PAD (peripheral artery disease)    Status post right above-the-knee-below the knee popliteal artery bypass; postop right ABI 0.96.   Pneumonia 2020   Sleep apnea  does not wear CPAP; think it was medication related (05/14/2017)   Stroke [facility]) 2010   TIA   Thoracic aortic ectasia    ~40 mm on Coronary Calcium  Score CT - recommend f/u CTA or MRA   TIA (transient ischemic attack) 09/2011   several    Past Surgical History:  Procedure Laterality Date   ABDOMINAL AORTOGRAM N/A 11/06/2024   Procedure: ABDOMINAL AORTOGRAM;  Surgeon: Gretta Lonni PARAS, MD;  Location: [facility] INVASIVE CV LAB;  Service:  Cardiovascular;  Laterality: N/A;   ABDOMINAL AORTOGRAM W/LOWER EXTREMITY N/A 05/14/2017   Procedure: Abdominal Aortogram w/Lower Extremity;  Surgeon: Laurence Redell CROME, MD;  Location: [facility] INVASIVE CV LAB;  Service: Cardiovascular;  Laterality: N/A;   ABDOMINAL AORTOGRAM W/LOWER EXTREMITY N/A 08/31/2022   Procedure: ABDOMINAL AORTOGRAM W/LOWER EXTREMITY;  Surgeon: Gretta Lonni PARAS, MD;  Location: [facility] INVASIVE CV LAB;  Service: Cardiovascular;  Laterality: N/A;   ABDOMINAL AORTOGRAM W/LOWER EXTREMITY N/A 12/07/2022   Procedure: ABDOMINAL AORTOGRAM W/LOWER EXTREMITY;  Surgeon: Gretta Lonni PARAS, MD;  Location: [facility];  Service: Cardiovascular;  Laterality: N/A;   ABDOMINAL AORTOGRAM W/LOWER EXTREMITY Right 10/25/2023   Procedure: ABDOMINAL AORTOGRAM W/LOWER EXTREMITY;  Surgeon: Gretta Lonni PARAS, MD;  Location: [facility];  Service: Cardiovascular;  Laterality: Right;   ACROMIO-CLAVICULAR JOINT REPAIR Left 1990s   I think it was called an [facility] joint removal   BACK SURGERY     BYPASS GRAFT POPLITEAL TO POPLITEAL Right 05/15/2017   Procedure: BYPASS GRAFT ABOVE KNEE POPLITEAL TO BELOW KNEE POPLITEAL ARTERY;  Surgeon: Laurence Redell CROME, MD;  Location: [facility] OR;  Service: Vascular;  Laterality: Right;   CARDIAC CATHETERIZATION     CORONARY STENT INTERVENTION N/A 09/30/2021   Procedure: CORONARY STENT INTERVENTION;  Surgeon: Anner Alm ORN, MD;  Location: [facility];  Service: Cardiovascular;  Laterality: N/A;   CORONARY ULTRASOUND/IVUS N/A 09/30/2021   Procedure: Intravascular Ultrasound/IVUS;  Surgeon: Anner Alm ORN, MD;  Location: [facility] INVASIVE CV LAB;  Service: Cardiovascular;  Laterality: N/A;   ENDARTERECTOMY FEMORAL Left 09/11/2022   Procedure: LEFT COMMON FEMORAL ENDARTERECTOMY;  Surgeon: Gretta Lonni PARAS, MD;  Location: [facility] OR;  Service: Vascular;  Laterality: Left;   ESOPHAGOGASTRODUODENOSCOPY (EGD) WITH PROPOFOL  N/A 04/29/2018   Procedure:  ESOPHAGOGASTRODUODENOSCOPY (EGD) WITH PROPOFOL ;  Surgeon: Elicia Claw, MD;  Location: [facility] ENDOSCOPY;  Service: Gastroenterology;  Laterality: N/A;   FOOT FRACTURE SURGERY Bilateral    multiple procedures   FRACTURE SURGERY     JOINT REPLACEMENT     LEFT HEART CATH AND CORONARY ANGIOGRAPHY N/A 09/30/2021   Procedure: LEFT HEART CATH AND CORONARY ANGIOGRAPHY;  Surgeon: Anner Alm ORN, MD;  Location: [facility] INVASIVE CV LAB;  Service: Cardiovascular;  Laterality: N/A;   LOWER EXTREMITY ANGIOGRAPHY N/A 06/16/2020   Procedure: LOWER EXTREMITY ANGIOGRAPHY;  Surgeon: Gretta Lonni PARAS, MD;  [facility]: [facility];  Service: Cardiovascular;  Laterality: N/A;   LOWER EXTREMITY ANGIOGRAPHY N/A 11/06/2024   Procedure: Lower Extremity Angiography;  Surgeon: Gretta Lonni PARAS, MD;  Location: [facility] INVASIVE CV LAB;  Service: Cardiovascular;  Laterality: N/A;   LOWER EXTREMITY INTERVENTION N/A 11/06/2024   Procedure: LOWER EXTREMITY INTERVENTION;  Surgeon: Gretta Lonni PARAS, MD;  Location: [facility];  Service: Cardiovascular;  Laterality: N/A;   MAXIMUM ACCESS (MAS)POSTERIOR LUMBAR INTERBODY FUSION (PLIF) 1 LEVEL N/A 09/30/2014   Procedure: FOR MAXIMUM ACCESS (MAS) POSTERIOR LUMBAR INTERBODY FUSION (PLIF) 1 LEVEL;  Surgeon: Alm GORMAN Molt, MD;  Location: [facility] NEURO [facility]S;  Service: Neurosurgery;  Laterality: N/A;  FOR MAXIMUM ACCESS (MAS) POSTERIOR LUMBAR INTERBODY FUSION (PLIF) 1 LEVEL LUMBAR 4-5   MULTIPLE TOOTH EXTRACTIONS Right 04/2017   NM MYOVIEW  LTD  06/2017    Normal EF, 66%. No ST changes. Normal study. LOW RISK.   PATCH ANGIOPLASTY Left 09/11/2022   Procedure: LEFT FEMORAL PATCH ANGIOPLASTY USING XENOSURE BIOLOGIC PATCH 1CMX14CM;  Surgeon: Gretta Lonni PARAS, MD;  Location: [facility] OR;  Service: Vascular;  Laterality: Left;   PERIPHERAL VASCULAR ATHERECTOMY Left 06/16/2020   Procedure: PERIPHERAL VASCULAR ATHERECTOMY;  Surgeon: Gretta Lonni PARAS, MD;  Location: [facility] INVASIVE CV LAB;   Service: Cardiovascular;  Laterality: Left;  SFA   PERIPHERAL VASCULAR ATHERECTOMY Left 12/07/2022   Procedure: PERIPHERAL VASCULAR ATHERECTOMY;  Surgeon: Gretta Lonni PARAS, MD;  Location: [facility];  Service: Cardiovascular;  Laterality: Left;   PERIPHERAL VASCULAR INTERVENTION Right 08/31/2022   Procedure: PERIPHERAL VASCULAR INTERVENTION;  Surgeon: Gretta Lonni PARAS, MD;  Location: [facility];  Service: Cardiovascular;  Laterality: Right;   PERIPHERAL VASCULAR INTERVENTION  12/07/2022   Procedure: PERIPHERAL VASCULAR INTERVENTION;  Surgeon: Gretta Lonni PARAS, MD;  Location: [facility];  Service: Cardiovascular;;   PERIPHERAL VASCULAR INTERVENTION Right 10/25/2023   Procedure: PERIPHERAL VASCULAR INTERVENTION;  Surgeon: Gretta Lonni PARAS, MD;  Location: [facility] INVASIVE CV LAB;  Service: Cardiovascular;  Laterality: Right;  right external iliac   TOTAL HIP ARTHROPLASTY Right 01/16/2020   Procedure: RIGHT TOTAL HIP ARTHROPLASTY ANTERIOR APPROACH;  Surgeon: Vernetta Lonni GRADE, MD;  Location: [facility] ORS;  Service: Orthopedics;  Laterality: Right;   TOTAL HIP ARTHROPLASTY Left 04/30/2020   Procedure: LEFT TOTAL HIP ARTHROPLASTY ANTERIOR APPROACH;  Surgeon: Vernetta Lonni GRADE, MD;  Location: [facility] ORS;  Service: Orthopedics;  Laterality: Left;   TRANSTHORACIC ECHOCARDIOGRAM  09/07/2013   Done to evaluate TIA: Normal LV size and function. EF 55-60%. GR 1 DD. Mild left atrial dilation. Mildly calcified mitral leaflets. Otherwise normal.    Social history   reports that she has been smoking cigarettes. She has never been exposed to tobacco smoke. She has never used smokeless tobacco. She reports current alcohol  use of about 2.0 standard drinks of alcohol  per week. She reports that she does not use drugs.  Allergies[1]  Family History  Problem Relation Age of Onset   Stroke Father    Heart attack Mother    Stroke Mother      Prior to Admission medications   Medication Sig Start Date End Date Taking? Authorizing Provider  albuterol  (VENTOLIN  HFA) 108 (90 Base) MCG/ACT inhaler Inhale 2 puffs into the lungs every 6 (six) hours as needed for shortness of breath. 03/08/24   Amin, Ankit C, MD  amLODipine  (NORVASC ) 10 MG tablet Take 10 mg by mouth in the morning.    [provider]  Apoaequorin (PREVAGEN) 10 MG CAPS Take 10 mg by mouth daily.    [provider]  ARIPiprazole  (ABILIFY ) 2 MG tablet Take 2 mg by mouth every morning.    [provider]  aspirin  EC 81 MG tablet Take 81 mg by mouth in the morning.    [provider]  aspirin -acetaminophen -caffeine  (EXCEDRIN  EXTRA STRENGTH) 250-250-65 MG tablet Take 2 tablets by mouth every 6 (six) hours as needed for headache.    [provider]  bisoprolol  (ZEBETA ) 5 MG tablet Take 1 tablet (5 mg total) by mouth daily. 08/15/24   Anner Alm ORN, MD  Brimonidine  Tartrate (LUMIFY ) 0.025 % SOLN Place 1 drop into  both eyes daily as needed (redness).    [provider]  Budeson-Glycopyrrol-Formoterol  (BREZTRI AEROSPHERE) 160-9-4.8 MCG/ACT AERO Inhale 1 puff into the lungs daily as needed (shortness of breath).    [provider]  buPROPion  (WELLBUTRIN  XL) 150 MG 24 hr tablet Take 150 mg by mouth every morning. 08/20/22   [provider]  clopidogrel  (PLAVIX ) 75 MG tablet Take 1 tablet (75 mg total) by mouth daily. 12/16/24   Gretta Lonni PARAS, MD  desvenlafaxine  (PRISTIQ ) 50 MG 24 hr tablet Take 50 mg by mouth in the morning.    [provider]  diazepam  (VALIUM ) 10 MG tablet Take 10 mg by mouth at bedtime as needed for sleep or anxiety.    [provider]  Ferrous Sulfate  (IRON HIGH-POTENCY) 45 MG TBCR Take 45 mg by mouth daily as needed (Low iron). Slow iron    [provider]  lisinopril  (ZESTRIL ) 10 MG tablet Take 10 mg by mouth in the morning. 03/22/20   [provider]  Multiple Vitamins-Minerals (CENTRUM  SILVER 50+WOMEN) TABS Take 1 tablet by mouth daily.    [provider]  naphazoline-pheniramine (VISINE) 0.025-0.3 % ophthalmic solution Place 1 drop into both eyes 4 (four) times daily as needed (dry/irritated eyes.).    [provider]  oxyCODONE -acetaminophen  (PERCOCET) 10-325 MG tablet Take 1 tablet by mouth every 6 (six) hours as needed for pain. 12/26/22   [provider]  pantoprazole  (PROTONIX ) 40 MG tablet Take 40 mg by mouth 2 (two) times daily as needed (acid reflux). 10/02/23   [provider]  prazosin  (MINIPRESS ) 1 MG capsule Take 1 mg by mouth at bedtime as needed (Sleep).    [provider]  Propylhexedrine (BENZEDREX NA) Place 1 spray into the nose 2 (two) times daily as needed (nasal congestion.).    [provider]  traZODone  (DESYREL ) 50 MG tablet Take 50 mg by mouth at bedtime as needed for sleep.    [provider]    Physical Exam: Vitals:   01/15/25 1706 01/15/25 1709 01/15/25 1727 01/15/25 2119  BP: (!) 145/95   129/72  Pulse: (!) 109   (!) 114  Resp:   (!) 22 19  Temp: 98.1 F (36.7 C)   98.2 F (36.8 C)  TempSrc: Oral   Oral  SpO2: 96%   99%  Weight:  55.3 kg    Height:  5' 5 (1.651 m)      Constitutional: NAD, calm, comfortable.  Frail. Vitals:   01/15/25 1706 01/15/25 1709 01/15/25 1727 01/15/25 2119  BP: (!) 145/95   129/72  Pulse: (!) 109   (!) 114  Resp:   (!) 22 19  Temp: 98.1 F (36.7 C)   98.2 F (36.8 C)  TempSrc: Oral   Oral  SpO2: 96%   99%  Weight:  55.3 kg    Height:  5' 5 (1.651 m)     Eyes: PERRL, lids and conjunctivae normal ENMT: Mucous membranes are moist. Posterior pharynx clear of any exudate or lesions.Normal dentition.  Neck: normal, supple, no masses, no thyromegaly Respiratory: Some conducted upper airway sounds, no wheezing, no crackles. Normal respiratory effort. No accessory muscle use.  On 4 L oxygen .  Able to talk in complete sentences. Cardiovascular:  Regular rate and rhythm, no murmurs / rubs / gallops. No extremity edema. 2+ pedal pulses. No carotid bruits.  Abdomen: no tenderness, no masses palpated. No hepatosplenomegaly. Bowel sounds positive.  Musculoskeletal: no clubbing / cyanosis. No joint  deformity upper and lower extremities. Good ROM, no contractures. Normal muscle tone.  Skin: no rashes, lesions, ulcers. No induration Neurologic: CN 2-12 grossly intact. Sensation intact, DTR normal. Strength 5/5 in all 4.  Psychiatric: Normal judgment and insight. Alert and oriented x 3. Normal mood.     Labs on Admission: I have personally reviewed following labs and imaging studies  CBC: Recent Labs  Lab 01/15/25 1753  WBC 14.0*  NEUTROABS 10.9*  HGB 12.8  HCT 37.2  MCV 96.1  PLT 559*   Basic Metabolic Panel: Recent Labs  Lab 01/15/25 1753  NA 131*  K 4.1  CL 96*  CO2 23  GLUCOSE 129*  BUN 9  CREATININE 0.99  CALCIUM  9.1   GFR: Estimated Creatinine Clearance: 44.2 mL/min (by C-G formula based on SCr of 0.99 mg/dL). Liver Function Tests: No results for input(s): AST, ALT, ALKPHOS, BILITOT, PROT, ALBUMIN  in the last 168 hours. No results for input(s): LIPASE, AMYLASE in the last 168 hours. No results for input(s): AMMONIA in the last 168 hours. Coagulation Profile: No results for input(s): INR, PROTIME in the last 168 hours. Cardiac Enzymes: No results for input(s): CKTOTAL, CKMB, CKMBINDEX, TROPONINI in the last 168 hours. BNP (last 3 results) No results for input(s): PROBNP in the last 8760 hours. HbA1C: No results for input(s): HGBA1C in the last 72 hours. CBG: No results for input(s): GLUCAP in the last 168 hours. Lipid Profile: No results for input(s): CHOL, HDL, LDLCALC, TRIG, CHOLHDL, LDLDIRECT in the last 72 hours. Thyroid  Function Tests: No results for input(s): TSH, T4TOTAL, FREET4, T3FREE, THYROIDAB in the last 72 hours. Anemia Panel: No  results for input(s): VITAMINB12, FOLATE, FERRITIN, TIBC, IRON, RETICCTPCT in the last 72 hours. Urine analysis:    Component Value Date/Time   COLORURINE YELLOW 09/11/2022 0743   APPEARANCEUR CLEAR 09/11/2022 0743   LABSPEC 1.011 09/11/2022 0743   PHURINE 5.0 09/11/2022 0743   GLUCOSEU NEGATIVE 09/11/2022 0743   HGBUR NEGATIVE 09/11/2022 0743   BILIRUBINUR NEGATIVE 09/11/2022 0743   KETONESUR NEGATIVE 09/11/2022 0743   PROTEINUR NEGATIVE 09/11/2022 0743   UROBILINOGEN 0.2 10/02/2011 0224   NITRITE NEGATIVE 09/11/2022 0743   LEUKOCYTESUR NEGATIVE 09/11/2022 0743    Radiological Exams on Admission: CT Angio Chest PE W and/or Wo Contrast Result Date: 01/15/2025 EXAM: CTA of the Chest without and with contrast for PE 01/15/2025 08:05:51 PM TECHNIQUE: CTA of the chest was performed after the administration of 75 mL of iohexol  (OMNIPAQUE ) 350 MG/ML injection. Multiplanar reformatted images are provided for review. MIP images are provided for review. Automated exposure control, iterative reconstruction, and/or weight based adjustment of the mA/kV was utilized to reduce the radiation dose to as low as reasonably achievable. COMPARISON: Chest x-ray today and CT 09/09/2021. CLINICAL HISTORY: Pulmonary embolism (PE) suspected, high prob. Pulmonary embolism suspected, high probability. FINDINGS: PULMONARY ARTERIES: Pulmonary arteries are adequately opacified for evaluation. No pulmonary embolism. Main pulmonary artery is normal in caliber. MEDIASTINUM: The heart demonstrates diffuse coronary artery atherosclerosis. There is diffuse aortic atherosclerosis. LYMPH NODES: No mediastinal, hilar or axillary lymphadenopathy. LUNGS AND PLEURA: Moderate to severe centrilobular emphysema. There is an anterior left upper lobe mass abutting the mediastinal surface measuring 3.6 x 3.0 cm on image 64 of series 4. This is suspicious for malignancy. Mucous plugging in the lower lobe airways bilaterally.  Bibasilar airspace opacities, likely atelectasis. No pleural effusion or pneumothorax. UPPER ABDOMEN: Limited images of the upper abdomen are unremarkable. SOFT TISSUES AND BONES: No acute bone or soft  tissue abnormality. IMPRESSION: 1. No pulmonary embolism. 2. Anterior left upper lobe mass abutting the mediastinal surface measuring 3.6 x 3.0 cm, suspicious for malignancy. Recommend thoracic surgical consultation. This could be further evaluated with PET CT or tissue sampling. 3. Moderate to advanced centrilobular emphysema. 4. Diffuse coronary artery disease, aortic atherosclerosis. 5. Mucous plugging in the lower lobes bilaterally with bibasilar atelectasis. Electronically signed by: Franky Crease MD 01/15/2025 08:14 PM EST RP Workstation: [id number]   DG Chest Portable 1 View Result Date: 01/15/2025 EXAM: 1 VIEW(S) XRAY OF THE CHEST 01/15/2025 05:31:00 PM COMPARISON: Comparison from 03/07/2024. CLINICAL HISTORY: Cough. FINDINGS: LUNGS AND PLEURA: Increased markings in the lung bases again noted, similar to prior study, likely bibasilar scarring or atelectasis. No acute confluent opacity is identified. No pleural effusion. No pneumothorax. HEART AND MEDIASTINUM: Aortic atherosclerosis is noted. No acute abnormality of the cardiac and mediastinal silhouettes. BONES AND SOFT TISSUES: No acute osseous abnormality. IMPRESSION: 1. No acute findings. 2. Increased markings in the lung bases, similar to prior study, likely bibasilar scarring or atelectasis . Electronically signed by: Franky Crease MD 01/15/2025 05:40 PM EST RP Workstation: [id number]    EKG: Independently reviewed.  Normal sinus rhythm.  Tachycardia.  No ST-T wave changes.  Assessment/Plan Principal Problem:   COPD with acute exacerbation (HCC) Active Problems:   Acute on chronic respiratory failure with hypoxia (HCC)   Essential hypertension   Hyperlipidemia with target LDL less than 70   S/P lumbar spinal fusion   PAD (peripheral artery  disease)   GERD (gastroesophageal reflux disease)   Depression   CAD S/P percutaneous coronary angioplasty   Hx of aorto-femoral bypass     1.  Acute on chronic hypoxemic respiratory failure due to COPD exacerbation: Agree with admission to monitored unit because of severity of symptoms. Aggressive bronchodilator therapy, continue Breztri, scheduled and as needed bronchodilators, deep breathing exercises, incentive spirometry, chest physiotherapy. Will treat with 5 days of azithromycin . Supplemental oxygen  to keep saturations more than 90%. Patient will need new oxygen  prescription on discharge.  Check ambulatory oxygen .  2.  Lung tumor: Newly diagnosed.  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  Likely malignant in a patient with history of smoking. She has thoracic spine fracture with minimal injury, wonder whether she has a pathological fracture. Will hold her aspirin  and Plavix . Please discuss case with pulmonary tomorrow morning for best approach of diagnosis.  3.  Peripheral artery disease status post aortofemoral bypass: Without any claudications.  Holding aspirin  and Plavix  as above.  4.  Chronic medical issues including Hypertension, stable.  Resume home medications. Hyperlipidemia, stable.  Resume home medications. Back pain, Tylenol , oxycodone  as needed.  5.  Smoker: Counseled to quit.  She is tapering off.  Nicotine  patch provided.    DVT prophylaxis: Lovenox  subcu Code Status: Full code Family Communication: None at the bedside Disposition Plan: Home once stable Consults called: None.  Needs routine pulmonary consult.  Please consult in the morning. Admission status: Inpatient.  Cardiac monitoring.   Renato Applebaum MD [facility]      [1]  Allergies Allergen Reactions   Penicillins Other (See Comments), Swelling, Anaphylaxis and Hives    TONGUE SWELLING   Statins Other (See Comments)    Muscle pain   Tapentadol Hcl Er Other (See  Comments)    Dry mouth and extreme sweating   Lyrica [Pregabalin] Other (See Comments)    Sores muscles   "

## 2025-01-16 ENCOUNTER — Encounter (HOSPITAL_COMMUNITY): Payer: Self-pay | Admitting: Internal Medicine

## 2025-01-16 ENCOUNTER — Other Ambulatory Visit: Payer: Self-pay | Admitting: Pulmonary Disease

## 2025-01-16 ENCOUNTER — Other Ambulatory Visit (HOSPITAL_COMMUNITY): Payer: Self-pay

## 2025-01-16 DIAGNOSIS — J9691 Respiratory failure, unspecified with hypoxia: Secondary | ICD-10-CM | POA: Diagnosis not present

## 2025-01-16 DIAGNOSIS — R918 Other nonspecific abnormal finding of lung field: Secondary | ICD-10-CM | POA: Diagnosis not present

## 2025-01-16 DIAGNOSIS — R9389 Abnormal findings on diagnostic imaging of other specified body structures: Secondary | ICD-10-CM

## 2025-01-16 DIAGNOSIS — J441 Chronic obstructive pulmonary disease with (acute) exacerbation: Secondary | ICD-10-CM | POA: Diagnosis not present

## 2025-01-16 LAB — CBC
HCT: 36.2 % (ref 36.0–46.0)
Hemoglobin: 12.2 g/dL (ref 12.0–15.0)
MCH: 33.1 pg (ref 26.0–34.0)
MCHC: 33.7 g/dL (ref 30.0–36.0)
MCV: 98.1 fL (ref 80.0–100.0)
Platelets: 554 K/uL — ABNORMAL HIGH (ref 150–400)
RBC: 3.69 MIL/uL — ABNORMAL LOW (ref 3.87–5.11)
RDW: 12.1 % (ref 11.5–15.5)
WBC: 7.3 K/uL (ref 4.0–10.5)
nRBC: 0 % (ref 0.0–0.2)

## 2025-01-16 MED ORDER — GUAIFENESIN ER 600 MG PO TB12
600.0000 mg | ORAL_TABLET | Freq: Two times a day (BID) | ORAL | 0 refills | Status: AC
  Filled 2025-01-16: qty 10, 5d supply, fill #0

## 2025-01-16 MED ORDER — PREDNISONE 20 MG PO TABS
20.0000 mg | ORAL_TABLET | Freq: Every day | ORAL | Status: DC
  Administered 2025-01-16: 20 mg via ORAL
  Filled 2025-01-16: qty 1

## 2025-01-16 MED ORDER — AZITHROMYCIN 250 MG PO TABS
500.0000 mg | ORAL_TABLET | Freq: Every day | ORAL | 0 refills | Status: AC
  Filled 2025-01-16: qty 4, 2d supply, fill #0

## 2025-01-16 MED ORDER — PREDNISONE 20 MG PO TABS
20.0000 mg | ORAL_TABLET | Freq: Every day | ORAL | 0 refills | Status: AC
  Filled 2025-01-16: qty 6, 6d supply, fill #0

## 2025-01-16 NOTE — Discharge Instructions (Signed)
 Please follow up with your Pulmonologist for further evaluation of your lung mass.

## 2025-01-16 NOTE — Discharge Summary (Signed)
 " Physician Discharge Summary   Patient: Brenda Carney MRN: [id number] DOB: 09-[name]-1951  Admit date:     01/15/2025  Discharge date: 01/16/25  Discharge Physician: MDALA-GAUSI, GOLDEN PILLOW   PCP: Stephane Leita DEL, MD   Recommendations at discharge:   Follow-up with Dr. Shellia (pulmonology)  Discharge Diagnoses: Principal Problem:   COPD with acute exacerbation [facility]) Active Problems:   Acute on chronic respiratory failure with hypoxia (HCC)   Essential hypertension   Hyperlipidemia with target LDL less than 70   S/P lumbar spinal fusion   PAD (peripheral artery disease)   GERD (gastroesophageal reflux disease)   Depression   CAD S/P percutaneous coronary angioplasty   Hx of aorto-femoral bypass  Resolved Problems:   * No resolved hospital problems. *  Hospital Course: Brenda Carney is a 74 y.o. woman with PMH of COPD, current smoker, peripheral vascular disease status post femoropopliteal bypass currently on aspirin  and Plavix , hypertension, depression, hyperlipidemia, apnea not on CPAP who presented from her pulmonologist's office with acute on chronic hypoxic respiratory failure.  She was diagnosed with COPD exacerbation.  CTA-PE to rule out pulmonary embolism was negative for PE but revealed a lung mass in the left upper lobe, suspicious for malignancy.  Pulmonology was consulted.  The rest of the hospital course is in problem-based format below:   Assessment and Plan:  Acute on chronic hypoxemic respiratory failure due to COPD exacerbation Patient on 2 to 4 L supplemental oxygen  at baseline. Presented to pulmonologist office with shortness of breath, requiring 5 L. Patient treated for COPD exacerbation, with improvement in respiratory status. Oxygen  requirement was back to baseline by the day of discharge.  COPD exacerbation Patient was started on steroids, azithromycin . Bronchodilator therapy was started and home Breztri was continued. She was seen by pulmonology who  recommended completing the azithromycin  and a 7-day course of steroids.   Lung mass Newly diagnosed.   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 She has thoracic spine fracture with minimal injury, possible pathological fracture. Pulmonology was consulted and stated the patient will need a PET scan and navigational bronchoscopy. Outpatient PET scan has been ordered from the pulmonologist discussed with the patient's home pulmonologist, Dr. Shellia. Patient will follow-up as outpatient.   Peripheral artery disease status post aortofemoral bypass  Hyperlipidemia Home statin, Plavix  and aspirin  were continued.  Hypertension Home medications were continued.  Chronic back pain Home Percocet was continued.      Consultants: Pulmonology Procedures performed: n/a  Disposition: Home Diet recommendation:  Discharge Diet Orders (From admission, onward)     Start     Ordered   01/16/25 0000  Diet general        01/16/25 1127           Regular diet DISCHARGE MEDICATION: Allergies as of 01/16/2025       Reactions   Penicillins Other (See Comments), Swelling, Anaphylaxis, Hives   TONGUE SWELLING   Statins Other (See Comments)   Muscle pain   Tapentadol Hcl Er Other (See Comments)   Dry mouth and extreme sweating   Lyrica [pregabalin] Other (See Comments)   Sores muscles        Medication List     TAKE these medications    Afrin 12 Hour 0.05 % nasal spray Generic drug: oxymetazoline Place 1 spray into both nostrils daily as needed for congestion.   amLODipine  10 MG tablet Commonly known  as: NORVASC  Take 10 mg by mouth in the morning.   ARIPiprazole  2 MG tablet Commonly known as: ABILIFY  Take 2 mg by mouth every morning.   aspirin  EC 81 MG tablet Take 81 mg by mouth in the morning.   azithromycin  250 MG tablet Commonly known as: ZITHROMAX  Take 2 tablets (500 mg total) by mouth daily for 2 days.    BENZEDREX NA Place 1 spray into both nostrils 2 (two) times daily as needed (nasal congestion).   bisoprolol  5 MG tablet Commonly known as: ZEBETA  Take 1 tablet (5 mg total) by mouth daily.   Breztri Aerosphere 160-9-4.8 MCG/ACT Aero inhaler Generic drug: budesonide -glycopyrrolate -formoterol  Inhale 1 puff into the lungs daily. What changed:  when to take this reasons to take this   buPROPion  150 MG 24 hr tablet Commonly known as: WELLBUTRIN  XL Take 150 mg by mouth every morning.   Centrum Silver 50+Women Tabs Take 1 tablet by mouth daily with breakfast.   clopidogrel  75 MG tablet Commonly known as: PLAVIX  Take 1 tablet (75 mg total) by mouth daily.   desvenlafaxine  50 MG 24 hr tablet Commonly known as: PRISTIQ  Take 50 mg by mouth in the morning.   diazepam  5 MG tablet Commonly known as: VALIUM  Take 2.5 mg by mouth 2 (two) times daily as needed for muscle spasms.   Excedrin  Extra Strength 250-250-65 MG tablet Generic drug: aspirin -acetaminophen -caffeine  Take 2 tablets by mouth every 6 (six) hours as needed (for headaches).   guaiFENesin  600 MG 12 hr tablet Commonly known as: MUCINEX  Take 1 tablet (600 mg total) by mouth 2 (two) times daily for 5 days.   lisinopril  10 MG tablet Commonly known as: ZESTRIL  Take 10 mg by mouth in the morning.   Lumify  0.025 % Soln Generic drug: Brimonidine  Tartrate Place 1 drop into both eyes 3 (three) times daily as needed (redness).   oxyCODONE -acetaminophen  10-325 MG tablet Commonly known as: PERCOCET Take 1 tablet by mouth 4 (four) times daily.   pantoprazole  40 MG tablet Commonly known as: PROTONIX  Take 40 mg by mouth 2 (two) times daily as needed (acid reflux).   prazosin  1 MG capsule Commonly known as: MINIPRESS  Take 1 mg by mouth at bedtime as needed (for sleep).   predniSONE  20 MG tablet Commonly known as: DELTASONE  Take 1 tablet (20 mg total) by mouth daily with breakfast for 6 days. Start taking on: January 17, 2025   Prevagen 10 MG Caps Generic drug: Apoaequorin Take 10 mg by mouth daily.   Slow Release Iron 45 MG Tbcr Generic drug: Ferrous Sulfate  Dried Take 45 mg by mouth daily as needed (when iron deficient).   Ventolin  HFA 108 (90 Base) MCG/ACT inhaler Generic drug: albuterol  Inhale 2 puffs into the lungs every 6 (six) hours as needed for shortness of breath.   Visine 0.025-0.3 % ophthalmic solution Generic drug: naphazoline-pheniramine Place 1 drop into both eyes in the morning.        Discharge Exam: Filed Weights   01/15/25 1709  Weight: 55.3 kg   Physical Exam on Day of Discharge   General: Alert, cheerful, oriented X3  Oral cavity: moist mucous membranes  Neck: supple  Chest: Diminished breath sounds CVS: S1,S2 RRR. No murmurs  Abd: No distention, soft, non-tender. No masses palpable  Extr: No edema    Condition at discharge: stable  The results of significant diagnostics from this hospitalization (including imaging, microbiology, ancillary and laboratory) are listed below for reference.   Imaging Studies: CT Angio Chest  PE W and/or Wo Contrast Result Date: 01/15/2025 EXAM: CTA of the Chest without and with contrast for PE 01/15/2025 08:05:51 PM TECHNIQUE: CTA of the chest was performed after the administration of 75 mL of iohexol  (OMNIPAQUE ) 350 MG/ML injection. Multiplanar reformatted images are provided for review. MIP images are provided for review. Automated exposure control, iterative reconstruction, and/or weight based adjustment of the mA/kV was utilized to reduce the radiation dose to as low as reasonably achievable. COMPARISON: Chest x-ray today and CT 09/09/2021. CLINICAL HISTORY: Pulmonary embolism (PE) suspected, high prob. Pulmonary embolism suspected, high probability. FINDINGS: PULMONARY ARTERIES: Pulmonary arteries are adequately opacified for evaluation. No pulmonary embolism. Main pulmonary artery is normal in caliber. MEDIASTINUM: The heart  demonstrates diffuse coronary artery atherosclerosis. There is diffuse aortic atherosclerosis. LYMPH NODES: No mediastinal, hilar or axillary lymphadenopathy. LUNGS AND PLEURA: Moderate to severe centrilobular emphysema. There is an anterior left upper lobe mass abutting the mediastinal surface measuring 3.6 x 3.0 cm on image 64 of series 4. This is suspicious for malignancy. Mucous plugging in the lower lobe airways bilaterally. Bibasilar airspace opacities, likely atelectasis. No pleural effusion or pneumothorax. UPPER ABDOMEN: Limited images of the upper abdomen are unremarkable. SOFT TISSUES AND BONES: No acute bone or soft tissue abnormality. IMPRESSION: 1. No pulmonary embolism. 2. Anterior left upper lobe mass abutting the mediastinal surface measuring 3.6 x 3.0 cm, suspicious for malignancy. Recommend thoracic surgical consultation. This could be further evaluated with PET CT or tissue sampling. 3. Moderate to advanced centrilobular emphysema. 4. Diffuse coronary artery disease, aortic atherosclerosis. 5. Mucous plugging in the lower lobes bilaterally with bibasilar atelectasis. Electronically signed by: Franky Crease MD 01/15/2025 08:14 PM EST RP Workstation: [id number]   DG Chest Portable 1 View Result Date: 01/15/2025 EXAM: 1 VIEW(S) XRAY OF THE CHEST 01/15/2025 05:31:00 PM COMPARISON: Comparison from 03/07/2024. CLINICAL HISTORY: Cough. FINDINGS: LUNGS AND PLEURA: Increased markings in the lung bases again noted, similar to prior study, likely bibasilar scarring or atelectasis. No acute confluent opacity is identified. No pleural effusion. No pneumothorax. HEART AND MEDIASTINUM: Aortic atherosclerosis is noted. No acute abnormality of the cardiac and mediastinal silhouettes. BONES AND SOFT TISSUES: No acute osseous abnormality. IMPRESSION: 1. No acute findings. 2. Increased markings in the lung bases, similar to prior study, likely bibasilar scarring or atelectasis . Electronically signed by: Franky Crease MD 01/15/2025 05:40 PM EST RP Workstation: [id number]   MR THORACIC SPINE WO CONTRAST Result Date: 01/13/2025 EXAM: MR Thoracic Spine without 01/13/2025 05:32:07 PM TECHNIQUE: Multiplanar multisequence MRI of the thoracic spine was performed without the administration of intravenous contrast. COMPARISON: None available CLINICAL HISTORY: Closed compression fracture of thoracic vertebra, initial encounter. Interscapular pain for 2 weeks. FINDINGS: BONES AND ALIGNMENT: T6 compression fracture with severe diffuse vertebral body height loss of approximately 80% and moderate marrow edema extending into the left greater than right posterior elements. 2 mm retropulsion of the T6 vertebral body. Prominent marrow T1 hypointensity throughout the T6 vertebral body without other findings specifically suggestive of an underlying lesion. Moderate chronic T8 superior endplate compression fracture. SPINAL CORD: Normal spinal cord volume. Normal spinal cord signal. SOFT TISSUES: Mild paraspinal soft tissue edema centered at T6. DEGENERATIVE CHANGES: Small central disc protrusion at T7-T8 and mild disc bulging at T11-T12 without spinal stenosis or spinal cord mass effect. Moderate bilateral neural foraminal stenosis at T6-T7. IMPRESSION: 1. Acute/subacute T6 compression fracture with 80% height loss and minimal retropulsion. 2. Chronic T8 compression fracture. 3. Mild spondylosis without significant spinal stenosis. Electronically  signed by: Dasie Hamburg MD 01/13/2025 06:05 PM EST RP Workstation: [id number]    Microbiology: Results for orders placed or performed during the hospital encounter of 01/15/25  Resp panel by RT-PCR (RSV, Flu A&B, Covid) Anterior Nasal Swab     Status: None   Collection Time: 01/15/25  6:40 PM   Specimen: Anterior Nasal Swab  Result Value Ref Range Status   SARS Coronavirus 2 by RT PCR NEGATIVE NEGATIVE Final    Comment: (NOTE) SARS-CoV-2 target nucleic acids are NOT DETECTED.  The  SARS-CoV-2 RNA is generally detectable in upper respiratory specimens during the acute phase of infection. The lowest concentration of SARS-CoV-2 viral copies this assay can detect is 138 copies/mL. A negative result does not preclude SARS-Cov-2 infection and should not be used as the sole basis for treatment or other patient management decisions. A negative result may occur with  improper specimen collection/handling, submission of specimen other than nasopharyngeal swab, presence of viral mutation(s) within the areas targeted by this assay, and inadequate number of viral copies(<138 copies/mL). A negative result must be combined with clinical observations, patient history, and epidemiological information. The expected result is Negative.  Fact Sheet for Patients:  bloggercourse.com  Fact Sheet for Healthcare Providers:  seriousbroker.it  This test is no t yet approved or cleared by the [place]  FDA and  has been authorized for detection and/or diagnosis of SARS-CoV-2 by FDA under an Emergency Use Authorization (EUA). This EUA will remain  in effect (meaning this test can be used) for the duration of the COVID-19 declaration under Section 564(b)(1) of the Act, 21 U.S.C.section 360bbb-3(b)(1), unless the authorization is terminated  or revoked sooner.       Influenza A by PCR NEGATIVE NEGATIVE Final   Influenza B by PCR NEGATIVE NEGATIVE Final    Comment: (NOTE) The Xpert Xpress SARS-CoV-2/FLU/RSV plus assay is intended as an aid in the diagnosis of influenza from Nasopharyngeal swab specimens and should not be used as a sole basis for treatment. Nasal washings and aspirates are unacceptable for Xpert Xpress SARS-CoV-2/FLU/RSV testing.  Fact Sheet for Patients: bloggercourse.com  Fact Sheet for Healthcare Providers: seriousbroker.it  This test is not yet approved or  cleared by the [place]  FDA and has been authorized for detection and/or diagnosis of SARS-CoV-2 by FDA under an Emergency Use Authorization (EUA). This EUA will remain in effect (meaning this test can be used) for the duration of the COVID-19 declaration under Section 564(b)(1) of the Act, 21 U.S.C. section 360bbb-3(b)(1), unless the authorization is terminated or revoked.     Resp Syncytial Virus by PCR NEGATIVE NEGATIVE Final    Comment: (NOTE) Fact Sheet for Patients: bloggercourse.com  Fact Sheet for Healthcare Providers: seriousbroker.it  This test is not yet approved or cleared by the [place]  FDA and has been authorized for detection and/or diagnosis of SARS-CoV-2 by FDA under an Emergency Use Authorization (EUA). This EUA will remain in effect (meaning this test can be used) for the duration of the COVID-19 declaration under Section 564(b)(1) of the Act, 21 U.S.C. section 360bbb-3(b)(1), unless the authorization is terminated or revoked.  Performed at [facility], [address], [place], [place] [address]     Labs: CBC: Recent Labs  Lab 01/15/25 1753 01/16/25 0641  WBC 14.0* 7.3  NEUTROABS 10.9*  --   HGB 12.8 12.2  HCT 37.2 36.2  MCV 96.1 98.1  PLT 559* 554*   Basic Metabolic Panel: Recent Labs  Lab 01/15/25 1753  NA 131*  K 4.1  CL 96*  CO2 23  GLUCOSE 129*  BUN 9  CREATININE 0.99  CALCIUM  9.1   Liver Function Tests: No results for input(s): AST, ALT, ALKPHOS, BILITOT, PROT, ALBUMIN  in the last 168 hours. CBG: No results for input(s): GLUCAP in the last 168 hours.  Discharge time spent: greater than 30 minutes.  Signed: MDALA-GAUSI, Teddrick Mallari AGATHA, MD [facility] 01/16/2025 "

## 2025-01-16 NOTE — Progress Notes (Signed)
 PT Cancellation Note  Patient Details Name: Brenda Carney MRN: [id number] DOB: 1951-05-10   Cancelled Treatment:    Reason Eval/Treat Not Completed: PT screened, no needs identified, will sign off Spoke with OT who reports pt completely independent and has no therapy needs.  Pt reports does have back pain but is managing.  Will sign off.  Brenda Carney, PT Acute Rehab [facility] [phone]   Brenda Carney [place] 01/16/2025, 11:35 AM

## 2025-01-16 NOTE — Evaluation (Signed)
 Occupational Therapy Evaluation Patient Details Name: Brenda Carney MRN: [id number] DOB: 1951-03-25 Today's Date: 01/16/2025   History of Present Illness   Patient is a 74 yo female presenting to the ED with SOB on 01/15/25. Admitted with COPD exacerbation. CT of chest finding 3 x 3 cm left upper lobe mass. Noted T6 compression fracture and chronic T8 fracture. PMH includes:  COPD, peripheral vascular disease status post femoropopliteal bypass currently on aspirin  and Plavix , hypertension, depression, hyperlipidemia, apnea not on CPAP     Clinical Impressions Prior to this admission, patient living alone, driving, and working as a physiological scientist. Patient takes care of 12 horses, 3 dogs, 2 parakeets, and a fish. Patient is independent and can manage her oxygen  independently. Patient is at her baseline from and ADL, transfer and mobility perspective, ambulating at 6L at 93% and returned to 4L when in sitting satting at 94%. Patient with no further OT needs or concerns. OT will sign off at this time, please re-consult if further acute needs arise.   Ambulating on 6L: O2 at 93%     If plan is discharge home, recommend the following:   Assist for transportation     Functional Status Assessment   Patient has not had a recent decline in their functional status     Equipment Recommendations   None recommended by OT     Recommendations for Other Services         Precautions/Restrictions   Precautions Precautions: Other (comment) Recall of Precautions/Restrictions: Intact Precaution/Restrictions Comments: watch O2 Restrictions Weight Bearing Restrictions Per Provider Order: No     Mobility Bed Mobility Overal bed mobility: Independent                  Transfers Overall transfer level: Independent                 General transfer comment: rolling her own O2 tank      Balance Overall balance assessment: Modified Independent                                          ADL either performed or assessed with clinical judgement   ADL Overall ADL's : Independent                                       General ADL Comments: Prior to this admission, patient living alone, driving, and working as a physiological scientist. Patient takes care of 12 horses, 3 dogs, 2 parakeets, and a fish. Patient is independent and can manage her oxygen  independently. Patient is at her baseline from and ADL, transfer and mobility perspective, ambulating at 6L at 93% and returned to 4L when in sitting satting at 94%. Patient with no further OT needs or concerns. OT will sign off at this time, please re-consult if further acute needs arise.     Vision Baseline Vision/History: 0 No visual deficits Ability to See in Adequate Light: 0 Adequate Patient Visual Report: No change from baseline Vision Assessment?: No apparent visual deficits     Perception Perception: Within Functional Limits       Praxis Praxis: WFL       Pertinent Vitals/Pain Pain Assessment Pain Assessment: 0-10 Pain Score: 7  Pain Location: pain in middle of shoulder blades Pain Descriptors /  Indicators: Grimacing, Guarding, Discomfort Pain Intervention(s): Limited activity within patient's tolerance, Monitored during session, Repositioned     Extremity/Trunk Assessment Upper Extremity Assessment Upper Extremity Assessment: Right hand dominant   Lower Extremity Assessment Lower Extremity Assessment: Overall WFL for tasks assessed   Cervical / Trunk Assessment Cervical / Trunk Assessment: Other exceptions (T6 and T8 fractures) Cervical / Trunk Exceptions: T6 and T8 fractures   Communication Communication Communication: No apparent difficulties   Cognition Arousal: Alert Behavior During Therapy: WFL for tasks assessed/performed Cognition: No apparent impairments                               Following commands: Intact       Cueing  General  Comments   Cueing Techniques: Verbal cues      Exercises     Shoulder Instructions      Home Living Family/patient expects to be discharged to:: Private residence Living Arrangements: Alone Available Help at Discharge: Friend(s);Available PRN/intermittently Type of Home: House Home Access: Stairs to enter [employer] of Steps: 6 Entrance Stairs-Rails: Left Home Layout: Two level;Full bath on main level;Able to live on main level with bedroom/bathroom Alternate Level Stairs-Number of Steps: 14   Bathroom Shower/Tub: Chief Strategy Officer: Standard     Home Equipment: Cane - single Librarian, Academic (2 wheels);Grab bars - tub/shower          Prior Functioning/Environment Prior Level of Function : Independent/Modified Independent;Working/employed;Driving             Mobility Comments: independent ADLs Comments: independent    OT Problem List: Decreased activity tolerance   OT Treatment/Interventions:        OT Goals(Current goals can be found in the care plan section)   Acute Rehab OT Goals Patient Stated Goal: to go home OT Goal Formulation: With patient Time For Goal Achievement: 01/30/25 Potential to Achieve Goals: Good   OT Frequency:       Co-evaluation              AM-PAC OT 6 Clicks Daily Activity     Outcome Measure Help from another person eating meals?: None Help from another person taking care of personal grooming?: None Help from another person toileting, which includes using toliet, bedpan, or urinal?: None Help from another person bathing (including washing, rinsing, drying)?: None Help from another person to put on and taking off regular upper body clothing?: None Help from another person to put on and taking off regular lower body clothing?: None 6 Click Score: 24   End of Session Nurse Communication: Mobility status;Other (comment) (Oxygen )  Activity Tolerance: Patient tolerated treatment  well Patient left: in bed;with call bell/phone within reach  OT Visit Diagnosis: Muscle weakness (generalized) (M62.81)                Time: 8975-8955 OT Time Calculation (min): 20 min Charges:  OT General Charges $OT Visit: 1 Visit OT Evaluation $OT Eval Moderate Complexity: 1 Mod  Ronal Gift E. Tanika Bracco, OTR/L Acute Rehabilitation Services [phone]   Ronal Gift Salt 01/16/2025, 10:55 AM

## 2025-01-16 NOTE — Consult Note (Signed)
 "  NAME:  Brenda Carney, MRN:  [id number], DOB:  1951/06/22, LOS: 1 ADMISSION DATE:  01/15/2025, CONSULTATION DATE: 01/16/2025 REFERRING MD: Dr. Jearlean, CHIEF COMPLAINT: Lung mass  History of Present Illness:  74 year old lady with history of COPD Recently treated for pneumonia following influenza-had multiple courses of antibiotics and steroids She remembers being on Levaquin  3 cigarettes a day smoker, was smoking a pack a day previously, has peripheral vascular disease hypertension, depression, hyperlipidemia, history of sleep apnea was seen by a pulmonologist-Dr. Sood-saturations could not be maintained despite escalating FiO2, she looked ashen and was sent to the hospital for further evaluation CT chest was performed which was negative for PE, consistent with emphysema, left upper lobe mass, right lower lobe infiltrate She has been more short of breath with exertion lately She does have oxygen  at home but she does not use it around-the-clock Denies any fevers or chills, she does have a cough, not really bringing up much secretions Underlying COPD managed with Breztri, has a nebulizer  Pertinent  Medical History   Past Medical History:  Diagnosis Date   Arthritis    thumbs; hips (05/14/2017)   CAD S/P 2 V PCI (pRCA, PCx-OM)    Coronary calcium  scoring: June 2018: Score 1106. 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   Cervical spondylosis    Chronic lower back pain    COPD (chronic obstructive pulmonary disease) (HCC)    little bit (05/14/2017)   Depression    Exercise-induced asthma with acute exacerbation    only in very hot weather (05/14/2017)   GERD (gastroesophageal reflux disease)    History of IBS     resovlved   Hx of multiple concussions    from horse training and riding   Hyperlipidemia with target LDL less than 70    Mostly statin intolerant,. Currently on Livalo  4 mg   Hypertension, benign    Lumbosacral spondylosis    PAD (peripheral artery disease)    Status post right above-the-knee-below the knee popliteal artery bypass; postop right ABI 0.96.   Pneumonia 2020   Sleep apnea    does not wear CPAP; think it was medication related (05/14/2017)   Stroke [facility]) 2010   TIA   Thoracic aortic ectasia    ~40 mm on Coronary Calcium  Score CT - recommend f/u CTA or MRA   TIA (transient ischemic attack) 09/2011   several     Significant Hospital Events: Including procedures, antibiotic start and stop dates in addition to other pertinent events   1/22 CT chest reviewed left upper lobe mass very close to the mediastinum, just behind the sternum 1/22 viral panel negative influenza, RSV, SARS  Interim History / Subjective:  She feels comfortable, denies acute shortness of breath, denies any chest pain or chest discomfort  Objective    Blood pressure (!) 153/92, pulse 100, temperature 98.4 F (36.9 C), resp. rate (!) 22, height 5' 5 (1.651 m), weight 55.3 kg, SpO2 94%.       No intake or output data in the 24 hours ending 01/16/25 0905 Filed Weights   01/15/25 1709  Weight: 55.3 kg    Examination: General: Elderly, does not appear to be in distress HENT: Moist oral mucosa Lungs: Poor air movement bilaterally  Cardiovascular: S1-S2 appreciated Abdomen: Soft, bowel sounds appreciated Extremities: No clubbing, no edema Neuro: Awake alert oriented x 3 GU:   BUN 9, creatinine 0.99 WBC 7.3, hematocrit of 36, platelet count of 554  Resolved problem list   Assessment and Plan   Left upper lobe mass -CT was reviewed by myself, 3 mm left upper lobe mass, was not present on a CT that was performed 09/09/2021-coronary CT  Dorn oh okay well maybe I think I am yeah yeah  yeah yeah yeah so far so good we will take it - I did contact Dr. Shellia - She will need navigational bronchoscopy, I believe she will be best served by having a PET scan prior to navigational bronchoscopy - Lesion will be challenging to biopsy because of location  Chronic obstructive pulmonary disease with exacerbation - Continue bronchodilator treatment - On Breztri at home  Hypoxemic respiratory failure - Wean down oxygen  supplementation as tolerated - Encouraged to check oxygen  levels at home and use oxygen  as needed  Will order PET scan-this will be done as outpatient - To have an early follow-up with Dr. Shellia soon after having the PET scan - Navigational bronchoscopy can be scheduled after that  There is no need to hold any antiplatelets from a pulmonary perspective as the bronchoscopy is not emergent  Patient will rather go home - She does look comfortable at present, not tachycardic, not tachypneic, will need oxygen  supplementation - Agree with completing azithromycin , I did start prednisone  20-for 7 days  Peripheral vascular disease,   Active smoker - Smoking cessation counseling   Labs   CBC: Recent Labs  Lab 01/15/25 1753 01/16/25 0641  WBC 14.0* 7.3  NEUTROABS 10.9*  --   HGB 12.8 12.2  HCT 37.2 36.2  MCV 96.1 98.1  PLT 559* 554*    Basic Metabolic Panel: Recent Labs  Lab 01/15/25 1753  NA 131*  K 4.1  CL 96*  CO2 23  GLUCOSE 129*  BUN 9  CREATININE 0.99  CALCIUM  9.1   GFR: Estimated Creatinine Clearance: 44.2 mL/min (by C-G formula based on SCr of 0.99 mg/dL). Recent Labs  Lab 01/15/25 1753 01/16/25 0641  WBC 14.0* 7.3    Liver Function Tests: No results for input(s): AST, ALT, ALKPHOS, BILITOT, PROT, ALBUMIN  in the last 168 hours. No results for input(s): LIPASE, AMYLASE in the last 168 hours. No results for input(s): AMMONIA in the last 168 hours.  ABG    Component Value Date/Time   HCO3 24.0 03/07/2024 1535    TCO2 27 11/06/2024 0857   ACIDBASEDEF 1.0 03/07/2024 1535   O2SAT 69 03/07/2024 1535     Coagulation Profile: No results for input(s): INR, PROTIME in the last 168 hours.  Cardiac Enzymes: No results for input(s): CKTOTAL, CKMB, CKMBINDEX, TROPONINI in the last 168 hours.  HbA1C: Hgb A1c MFr Bld  Date/Time Value Ref Range Status  09/08/2021 01:34 PM 5.4 4.8 - 5.6 % Final    Comment:             Prediabetes: 5.7 - 6.4          Diabetes: >6.4          Glycemic control for adults with diabetes: <7.0   09/05/2013 08:27 PM 5.6 <5.7 % Final    Comment:    (NOTE)  According to the ADA Clinical Practice Recommendations for 2011, when HbA1c is used as a screening test:  >=6.5%   Diagnostic of Diabetes Mellitus           (if abnormal result is confirmed) 5.7-6.4%   Increased risk of developing Diabetes Mellitus References:Diagnosis and Classification of Diabetes Mellitus,Diabetes Care,2011,34(Suppl 1):S62-S69 and Standards of Medical Care in         Diabetes - 2011,Diabetes Care,2011,34 (Suppl 1):S11-S61.    CBG: No results for input(s): GLUCAP in the last 168 hours.  Review of Systems:   Has had no weight loss, appetite maintained  Past Medical History:  She,  has a past medical history of Arthritis, CAD S/P 2 V PCI (pRCA, PCx-OM), Cerebral aneurysm, Cerebrovascular disease, Cervical spondylosis, Chronic lower back pain, COPD (chronic obstructive pulmonary disease) (HCC), Depression, Exercise-induced asthma with acute exacerbation, GERD (gastroesophageal reflux disease), History of IBS, multiple concussions, Hyperlipidemia with target LDL less than 70, Hypertension, benign, Lumbosacral spondylosis, PAD (peripheral artery disease), Pneumonia (2020), Sleep apnea, Stroke (HCC) (2010), Thoracic aortic ectasia, and TIA (transient ischemic attack) (09/2011).   Surgical History:   Past Surgical History:   Procedure Laterality Date   ABDOMINAL AORTOGRAM N/A 11/06/2024   Procedure: ABDOMINAL AORTOGRAM;  Surgeon: Gretta Lonni PARAS, MD;  Location: [facility] INVASIVE CV LAB;  Service: Cardiovascular;  Laterality: N/A;   ABDOMINAL AORTOGRAM W/LOWER EXTREMITY N/A 05/14/2017   Procedure: Abdominal Aortogram w/Lower Extremity;  Surgeon: Laurence Redell CROME, MD;  Location: [facility] INVASIVE CV LAB;  Service: Cardiovascular;  Laterality: N/A;   ABDOMINAL AORTOGRAM W/LOWER EXTREMITY N/A 08/31/2022   Procedure: ABDOMINAL AORTOGRAM W/LOWER EXTREMITY;  Surgeon: Gretta Lonni PARAS, MD;  Location: [facility] INVASIVE CV LAB;  Service: Cardiovascular;  Laterality: N/A;   ABDOMINAL AORTOGRAM W/LOWER EXTREMITY N/A 12/07/2022   Procedure: ABDOMINAL AORTOGRAM W/LOWER EXTREMITY;  Surgeon: Gretta Lonni PARAS, MD;  Location: [facility] INVASIVE CV LAB;  Service: Cardiovascular;  Laterality: N/A;   ABDOMINAL AORTOGRAM W/LOWER EXTREMITY Right 10/25/2023   Procedure: ABDOMINAL AORTOGRAM W/LOWER EXTREMITY;  Surgeon: Gretta Lonni PARAS, MD;  Location: [facility] INVASIVE CV LAB;  Service: Cardiovascular;  Laterality: Right;   ACROMIO-CLAVICULAR JOINT REPAIR Left 1990s   I think it was called an [facility] joint removal   BACK SURGERY     BYPASS GRAFT POPLITEAL TO POPLITEAL Right 05/15/2017   Procedure: BYPASS GRAFT ABOVE KNEE POPLITEAL TO BELOW KNEE POPLITEAL ARTERY;  Surgeon: Laurence Redell CROME, MD;  Location: [facility] OR;  Service: Vascular;  Laterality: Right;   CARDIAC CATHETERIZATION     CORONARY STENT INTERVENTION N/A 09/30/2021   Procedure: CORONARY STENT INTERVENTION;  Surgeon: Anner Alm ORN, MD;  Location: [facility] INVASIVE CV LAB;  Service: Cardiovascular;  Laterality: N/A;   CORONARY ULTRASOUND/IVUS N/A 09/30/2021   Procedure: Intravascular Ultrasound/IVUS;  Surgeon: Anner Alm ORN, MD;  Location: [facility] INVASIVE CV LAB;  Service: Cardiovascular;  Laterality: N/A;   ENDARTERECTOMY FEMORAL Left 09/11/2022   Procedure: LEFT COMMON FEMORAL ENDARTERECTOMY;  Surgeon: Gretta Lonni PARAS, MD;  Location: [facility] OR;  Service: Vascular;  Laterality: Left;   ESOPHAGOGASTRODUODENOSCOPY (EGD) WITH PROPOFOL  N/A 04/29/2018   Procedure: ESOPHAGOGASTRODUODENOSCOPY (EGD) WITH PROPOFOL ;  Surgeon: Elicia Claw, MD;  Location: [facility] ENDOSCOPY;  Service: Gastroenterology;  Laterality: N/A;   FOOT FRACTURE SURGERY Bilateral    multiple procedures   FRACTURE SURGERY     JOINT REPLACEMENT     LEFT HEART CATH AND CORONARY ANGIOGRAPHY N/A 09/30/2021   Procedure: LEFT HEART CATH AND CORONARY ANGIOGRAPHY;  Surgeon: Anner Alm ORN, MD;  Location:  [facility] INVASIVE CV LAB;  Service: Cardiovascular;  Laterality: N/A;   LOWER EXTREMITY ANGIOGRAPHY N/A 06/16/2020   Procedure: LOWER EXTREMITY ANGIOGRAPHY;  Surgeon: Gretta Lonni PARAS, MD;  Location: [facility] INVASIVE CV LAB;  Service: Cardiovascular;  Laterality: N/A;   LOWER EXTREMITY ANGIOGRAPHY N/A 11/06/2024   Procedure: Lower Extremity Angiography;  Surgeon: Gretta Lonni PARAS, MD;  Location: [facility] INVASIVE CV LAB;  Service: Cardiovascular;  Laterality: N/A;   LOWER EXTREMITY INTERVENTION N/A 11/06/2024   Procedure: LOWER EXTREMITY INTERVENTION;  Surgeon: Gretta Lonni PARAS, MD;  Location: [facility] INVASIVE CV LAB;  Service: Cardiovascular;  Laterality: N/A;   MAXIMUM ACCESS (MAS)POSTERIOR LUMBAR INTERBODY FUSION (PLIF) 1 LEVEL N/A 09/30/2014   Procedure: FOR MAXIMUM ACCESS (MAS) POSTERIOR LUMBAR INTERBODY FUSION (PLIF) 1 LEVEL;  Surgeon: Alm GORMAN Molt, MD;  Location: [facility] NEURO ORS;  Service: Neurosurgery;  Laterality: N/A;  FOR MAXIMUM ACCESS (MAS) POSTERIOR LUMBAR INTERBODY FUSION (PLIF) 1 LEVEL LUMBAR 4-5   MULTIPLE TOOTH EXTRACTIONS Right 04/2017   NM MYOVIEW  LTD  06/2017    Normal EF, 66%. No ST changes. Normal study. LOW RISK.   PATCH ANGIOPLASTY Left 09/11/2022   Procedure: LEFT FEMORAL PATCH ANGIOPLASTY USING XENOSURE BIOLOGIC PATCH [id number];  Surgeon: Gretta Lonni PARAS, MD;  Location: [facility] OR;  Service: Vascular;  Laterality: Left;    PERIPHERAL VASCULAR ATHERECTOMY Left 06/16/2020   Procedure: PERIPHERAL VASCULAR ATHERECTOMY;  Surgeon: Gretta Lonni PARAS, MD;  Location: [facility] INVASIVE CV LAB;  Service: Cardiovascular;  Laterality: Left;  SFA   PERIPHERAL VASCULAR ATHERECTOMY Left 12/07/2022   Procedure: PERIPHERAL VASCULAR ATHERECTOMY;  Surgeon: Gretta Lonni PARAS, MD;  Location: [facility] INVASIVE CV LAB;  Service: Cardiovascular;  Laterality: Left;   PERIPHERAL VASCULAR INTERVENTION Right 08/31/2022   Procedure: PERIPHERAL VASCULAR INTERVENTION;  Surgeon: Gretta Lonni PARAS, MD;  Location: [facility] INVASIVE CV LAB;  Service: Cardiovascular;  Laterality: Right;   PERIPHERAL VASCULAR INTERVENTION  12/07/2022   Procedure: PERIPHERAL VASCULAR INTERVENTION;  Surgeon: Gretta Lonni PARAS, MD;  Location: [facility] INVASIVE CV LAB;  Service: Cardiovascular;;   PERIPHERAL VASCULAR INTERVENTION Right 10/25/2023   Procedure: PERIPHERAL VASCULAR INTERVENTION;  Surgeon: Gretta Lonni PARAS, MD;  Location: [facility] INVASIVE CV LAB;  Service: Cardiovascular;  Laterality: Right;  right external iliac   TOTAL HIP ARTHROPLASTY Right 01/16/2020   Procedure: RIGHT TOTAL HIP ARTHROPLASTY ANTERIOR APPROACH;  Surgeon: Vernetta Lonni GRADE, MD;  Location: [facility] ORS;  Service: Orthopedics;  Laterality: Right;   TOTAL HIP ARTHROPLASTY Left 04/30/2020   Procedure: LEFT TOTAL HIP ARTHROPLASTY ANTERIOR APPROACH;  Surgeon: Vernetta Lonni GRADE, MD;  Location: [facility] ORS;  Service: Orthopedics;  Laterality: Left;   TRANSTHORACIC ECHOCARDIOGRAM  09/07/2013   Done to evaluate TIA: Normal LV size and function. EF 55-60%. GR 1 DD. Mild left atrial dilation. Mildly calcified mitral leaflets. Otherwise normal.     Social History:   reports that she has been smoking cigarettes. She has never been exposed to tobacco smoke. She has never used smokeless tobacco. She reports current alcohol  use of about 2.0 standard drinks of alcohol  per week. She reports that she does not use drugs.    Family History:  Her family history includes Heart attack in her mother; Stroke in her father and mother.   Allergies Allergies[1]   Jennet Epley, MD [place] [facility] Pager: See Amion            [1]  Allergies Allergen Reactions   Penicillins Other (See Comments), Swelling, Anaphylaxis and Hives    TONGUE SWELLING   Statins Other (See Comments)  Muscle pain   Tapentadol Hcl Er Other (See Comments)    Dry mouth and extreme sweating   Lyrica [Pregabalin] Other (See Comments)    Sores muscles   "

## 2025-01-17 LAB — MISC LABCORP TEST (SEND OUT)
LabCorp test name: 83935
Labcorp test code: 83935

## 2025-01-30 ENCOUNTER — Encounter (HOSPITAL_COMMUNITY): Admission: RE | Admit: 2025-01-30 | Source: Ambulatory Visit

## 2025-01-30 DIAGNOSIS — R918 Other nonspecific abnormal finding of lung field: Secondary | ICD-10-CM

## 2025-01-30 DIAGNOSIS — R9389 Abnormal findings on diagnostic imaging of other specified body structures: Secondary | ICD-10-CM

## 2025-01-30 LAB — GLUCOSE, CAPILLARY: Glucose-Capillary: 141 mg/dL — ABNORMAL HIGH (ref 70–99)

## 2025-01-30 MED ORDER — FLUDEOXYGLUCOSE F - 18 (FDG) INJECTION
6.0000 | Freq: Once | INTRAVENOUS | Status: AC
  Administered 2025-01-30: 6.3 via INTRAVENOUS

## 2025-06-02 ENCOUNTER — Ambulatory Visit

## 2025-06-02 ENCOUNTER — Encounter (HOSPITAL_COMMUNITY)

## 2025-06-23 ENCOUNTER — Ambulatory Visit (HOSPITAL_COMMUNITY)

## 2025-06-23 ENCOUNTER — Ambulatory Visit
